# Patient Record
Sex: Female | Born: 1940 | Race: White | Hispanic: No | Smoking: Never smoker
Health system: Southern US, Community
[De-identification: ages and names within clinical notes are randomized; demographics above are authoritative.]

## PROBLEM LIST (undated history)

## (undated) DIAGNOSIS — K219 Gastro-esophageal reflux disease without esophagitis: Secondary | ICD-10-CM

## (undated) DIAGNOSIS — M1990 Unspecified osteoarthritis, unspecified site: Secondary | ICD-10-CM

## (undated) DIAGNOSIS — R569 Unspecified convulsions: Secondary | ICD-10-CM

## (undated) DIAGNOSIS — I10 Essential (primary) hypertension: Secondary | ICD-10-CM

## (undated) DIAGNOSIS — I2510 Atherosclerotic heart disease of native coronary artery without angina pectoris: Secondary | ICD-10-CM

## (undated) DIAGNOSIS — L4050 Arthropathic psoriasis, unspecified: Secondary | ICD-10-CM

## (undated) DIAGNOSIS — I208 Other forms of angina pectoris: Secondary | ICD-10-CM

## (undated) DIAGNOSIS — R0600 Dyspnea, unspecified: Secondary | ICD-10-CM

## (undated) DIAGNOSIS — I219 Acute myocardial infarction, unspecified: Secondary | ICD-10-CM

## (undated) DIAGNOSIS — E119 Type 2 diabetes mellitus without complications: Secondary | ICD-10-CM

## (undated) DIAGNOSIS — I509 Heart failure, unspecified: Secondary | ICD-10-CM

## (undated) DIAGNOSIS — G459 Transient cerebral ischemic attack, unspecified: Secondary | ICD-10-CM

## (undated) DIAGNOSIS — E7800 Pure hypercholesterolemia, unspecified: Secondary | ICD-10-CM

## (undated) DIAGNOSIS — I214 Non-ST elevation (NSTEMI) myocardial infarction: Secondary | ICD-10-CM

## (undated) HISTORY — PX: CORONARY ANGIOPLASTY: SHX604

## (undated) HISTORY — PX: CAROTID STENT INSERTION: SHX5766

## (undated) HISTORY — PX: CHOLECYSTECTOMY OPEN: SUR202

## (undated) HISTORY — PX: TONSILLECTOMY: SUR1361

## (undated) HISTORY — PX: DILATION AND CURETTAGE OF UTERUS: SHX78

## (undated) HISTORY — PX: CATARACT EXTRACTION W/ INTRAOCULAR LENS  IMPLANT, BILATERAL: SHX1307

## (undated) HISTORY — PX: CARDIAC CATHETERIZATION: SHX172

## (undated) HISTORY — PX: APPENDECTOMY: SHX54

## (undated) HISTORY — PX: THROMBECTOMY FEMORAL ARTERY: SHX6406

---

## 1971-08-25 HISTORY — PX: ABDOMINAL HYSTERECTOMY: SHX81

## 1995-08-25 HISTORY — PX: CORONARY ARTERY BYPASS GRAFT: SHX141

## 2004-12-01 ENCOUNTER — Ambulatory Visit: Payer: Self-pay | Admitting: Internal Medicine

## 2005-03-31 ENCOUNTER — Ambulatory Visit: Payer: Self-pay

## 2006-03-02 ENCOUNTER — Ambulatory Visit: Payer: Self-pay

## 2006-03-05 ENCOUNTER — Ambulatory Visit: Payer: Self-pay

## 2006-04-05 ENCOUNTER — Ambulatory Visit: Payer: Self-pay | Admitting: Internal Medicine

## 2006-04-14 ENCOUNTER — Ambulatory Visit: Payer: Self-pay | Admitting: Internal Medicine

## 2006-09-15 ENCOUNTER — Inpatient Hospital Stay: Payer: Self-pay | Admitting: Vascular Surgery

## 2007-04-07 ENCOUNTER — Ambulatory Visit: Payer: Self-pay | Admitting: Internal Medicine

## 2007-05-10 ENCOUNTER — Ambulatory Visit: Payer: Self-pay | Admitting: Internal Medicine

## 2007-08-29 ENCOUNTER — Ambulatory Visit: Payer: Self-pay | Admitting: Internal Medicine

## 2007-09-07 ENCOUNTER — Ambulatory Visit: Payer: Self-pay | Admitting: Internal Medicine

## 2007-12-13 ENCOUNTER — Other Ambulatory Visit: Payer: Self-pay

## 2007-12-13 ENCOUNTER — Ambulatory Visit: Payer: Self-pay | Admitting: Ophthalmology

## 2007-12-27 ENCOUNTER — Ambulatory Visit: Payer: Self-pay | Admitting: Ophthalmology

## 2008-04-09 ENCOUNTER — Ambulatory Visit: Payer: Self-pay | Admitting: Internal Medicine

## 2008-06-03 ENCOUNTER — Other Ambulatory Visit: Payer: Self-pay

## 2008-06-03 ENCOUNTER — Emergency Department: Payer: Self-pay | Admitting: Unknown Physician Specialty

## 2008-08-29 ENCOUNTER — Ambulatory Visit: Payer: Self-pay | Admitting: Internal Medicine

## 2009-02-07 ENCOUNTER — Ambulatory Visit: Payer: Self-pay | Admitting: Internal Medicine

## 2009-04-17 ENCOUNTER — Ambulatory Visit: Payer: Self-pay | Admitting: Internal Medicine

## 2009-05-01 ENCOUNTER — Ambulatory Visit: Payer: Self-pay | Admitting: Ophthalmology

## 2009-05-01 ENCOUNTER — Ambulatory Visit: Payer: Self-pay | Admitting: Internal Medicine

## 2009-05-14 ENCOUNTER — Ambulatory Visit: Payer: Self-pay | Admitting: Ophthalmology

## 2009-07-17 ENCOUNTER — Ambulatory Visit: Payer: Self-pay | Admitting: Unknown Physician Specialty

## 2009-07-30 ENCOUNTER — Ambulatory Visit: Payer: Self-pay | Admitting: Unknown Physician Specialty

## 2009-09-11 ENCOUNTER — Ambulatory Visit: Payer: Self-pay | Admitting: Internal Medicine

## 2009-09-24 ENCOUNTER — Ambulatory Visit: Payer: Self-pay | Admitting: Vascular Surgery

## 2009-10-02 ENCOUNTER — Inpatient Hospital Stay: Payer: Self-pay | Admitting: Vascular Surgery

## 2010-02-06 ENCOUNTER — Ambulatory Visit: Payer: Self-pay

## 2010-02-14 ENCOUNTER — Emergency Department: Payer: Self-pay | Admitting: Emergency Medicine

## 2010-04-22 ENCOUNTER — Ambulatory Visit: Payer: Self-pay | Admitting: Internal Medicine

## 2010-06-14 ENCOUNTER — Emergency Department: Payer: Self-pay | Admitting: Emergency Medicine

## 2010-08-09 ENCOUNTER — Inpatient Hospital Stay: Payer: Self-pay | Admitting: Vascular Surgery

## 2010-08-15 LAB — PATHOLOGY REPORT

## 2011-02-06 ENCOUNTER — Ambulatory Visit: Payer: Self-pay | Admitting: Neurology

## 2011-04-24 ENCOUNTER — Ambulatory Visit: Payer: Self-pay | Admitting: Internal Medicine

## 2011-09-16 ENCOUNTER — Emergency Department: Payer: Self-pay | Admitting: Emergency Medicine

## 2011-09-16 ENCOUNTER — Ambulatory Visit: Payer: Self-pay

## 2011-09-16 LAB — COMPREHENSIVE METABOLIC PANEL
Albumin: 4.2 g/dL (ref 3.4–5.0)
Alkaline Phosphatase: 44 U/L — ABNORMAL LOW (ref 50–136)
Anion Gap: 12 (ref 7–16)
Bilirubin,Total: 0.3 mg/dL (ref 0.2–1.0)
Chloride: 102 mmol/L (ref 98–107)
Co2: 26 mmol/L (ref 21–32)
Creatinine: 0.72 mg/dL (ref 0.60–1.30)
EGFR (African American): >60
EGFR (Non-African Amer.): >60
Glucose: 200 mg/dL — ABNORMAL HIGH (ref 65–99)
Osmolality: 286 (ref 275–301)
Potassium: 4.3 mmol/L (ref 3.5–5.1)
SGPT (ALT): 24 U/L
Sodium: 140 mmol/L (ref 136–145)
Total Protein: 7.8 g/dL (ref 6.4–8.2)

## 2011-09-16 LAB — APTT: Activated PTT: 30 secs (ref 23.6–35.9)

## 2011-09-16 LAB — CBC
HGB: 12.7 g/dL (ref 12.0–16.0)
MCH: 34.6 pg — ABNORMAL HIGH (ref 26.0–34.0)
Platelet: 156 10*3/uL (ref 150–440)
RBC: 3.67 10*6/uL — ABNORMAL LOW (ref 3.80–5.20)
WBC: 5.9 10*3/uL (ref 3.6–11.0)

## 2011-09-16 LAB — PROTIME-INR: INR: 0.9

## 2011-09-25 ENCOUNTER — Ambulatory Visit: Payer: Self-pay

## 2011-10-23 ENCOUNTER — Ambulatory Visit: Payer: Self-pay

## 2012-04-26 ENCOUNTER — Ambulatory Visit: Payer: Self-pay | Admitting: Internal Medicine

## 2013-02-26 LAB — TROPONIN I: Troponin-I: <0.02 ng/mL

## 2013-02-26 LAB — BASIC METABOLIC PANEL
Anion Gap: 9 (ref 7–16)
BUN: 15 mg/dL (ref 7–18)
Chloride: 104 mmol/L (ref 98–107)
Co2: 27 mmol/L (ref 21–32)
Creatinine: 0.91 mg/dL (ref 0.60–1.30)
EGFR (Non-African Amer.): >60
Glucose: 377 mg/dL — ABNORMAL HIGH (ref 65–99)
Osmolality: 296 (ref 275–301)

## 2013-02-26 LAB — CBC
HCT: 37.6 % (ref 35.0–47.0)
HGB: 12.9 g/dL (ref 12.0–16.0)
MCH: 34.8 pg — ABNORMAL HIGH (ref 26.0–34.0)
Platelet: 252 10*3/uL (ref 150–440)
RBC: 3.7 10*6/uL — ABNORMAL LOW (ref 3.80–5.20)
WBC: 5.6 10*3/uL (ref 3.6–11.0)

## 2013-02-26 LAB — CK TOTAL AND CKMB (NOT AT ARMC): CK-MB: 1.7 ng/mL (ref 0.5–3.6)

## 2013-02-27 ENCOUNTER — Inpatient Hospital Stay: Payer: Self-pay | Admitting: Internal Medicine

## 2013-02-27 LAB — CK TOTAL AND CKMB (NOT AT ARMC): CK, Total: 75 U/L (ref 21–215)

## 2013-02-27 LAB — TROPONIN I: Troponin-I: 0.35 ng/mL — ABNORMAL HIGH

## 2013-02-28 LAB — BASIC METABOLIC PANEL
Chloride: 107 mmol/L (ref 98–107)
Co2: 26 mmol/L (ref 21–32)
EGFR (African American): >60
EGFR (Non-African Amer.): >60
Glucose: 197 mg/dL — ABNORMAL HIGH (ref 65–99)
Osmolality: 282 (ref 275–301)

## 2013-02-28 LAB — CBC WITH DIFFERENTIAL/PLATELET
Eosinophil #: 0.2 10*3/uL (ref 0.0–0.7)
Eosinophil %: 4.7 %
Lymphocyte #: 1.7 10*3/uL (ref 1.0–3.6)
MCHC: 34.3 g/dL (ref 32.0–36.0)
MCV: 101 fL — ABNORMAL HIGH (ref 80–100)
Monocyte #: 0.5 x10 3/mm (ref 0.2–0.9)
Monocyte %: 11.8 %
Neutrophil %: 45.2 %
Platelet: 211 10*3/uL (ref 150–440)

## 2013-04-18 ENCOUNTER — Ambulatory Visit: Payer: Self-pay | Admitting: Vascular Surgery

## 2013-04-18 LAB — BASIC METABOLIC PANEL
Anion Gap: 7 (ref 7–16)
BUN: 14 mg/dL (ref 7–18)
Co2: 27 mmol/L (ref 21–32)
EGFR (African American): >60
EGFR (Non-African Amer.): >60
Glucose: 257 mg/dL — ABNORMAL HIGH (ref 65–99)
Potassium: 4.2 mmol/L (ref 3.5–5.1)

## 2013-04-27 ENCOUNTER — Ambulatory Visit: Payer: Self-pay | Admitting: Internal Medicine

## 2013-05-25 ENCOUNTER — Ambulatory Visit: Payer: Self-pay | Admitting: Internal Medicine

## 2014-05-24 ENCOUNTER — Ambulatory Visit: Payer: Self-pay | Admitting: Internal Medicine

## 2014-08-09 ENCOUNTER — Ambulatory Visit: Payer: Self-pay | Admitting: Cardiology

## 2014-12-13 ENCOUNTER — Observation Stay
Admit: 2014-12-13 | Disposition: A | Discharge status: Home / Self Care | Payer: Self-pay | Attending: Internal Medicine | Admitting: Internal Medicine

## 2014-12-13 LAB — COMPREHENSIVE METABOLIC PANEL
ALT: 18 U/L
AST: 25 U/L
Albumin: 4.4 g/dL
Alkaline Phosphatase: 51 U/L
Anion Gap: 8 (ref 7–16)
BUN: 16 mg/dL
Bilirubin,Total: 0.6 mg/dL
CALCIUM: 9.3 mg/dL
Chloride: 102 mmol/L
Co2: 25 mmol/L
Creatinine: 0.8 mg/dL
EGFR (African American): >60
EGFR (Non-African Amer.): >60
GLUCOSE: 307 mg/dL — AB
Potassium: 4.7 mmol/L
SODIUM: 135 mmol/L
Total Protein: 7.9 g/dL

## 2014-12-13 LAB — CBC
HCT: 38.2 % (ref 35.0–47.0)
HGB: 12.7 g/dL (ref 12.0–16.0)
MCH: 31.2 pg (ref 26.0–34.0)
MCHC: 33.3 g/dL (ref 32.0–36.0)
MCV: 94 fL (ref 80–100)
PLATELETS: 186 10*3/uL (ref 150–440)
RBC: 4.09 10*6/uL (ref 3.80–5.20)
RDW: 14.6 % — AB (ref 11.5–14.5)
WBC: 5.8 10*3/uL (ref 3.6–11.0)

## 2014-12-13 LAB — CK TOTAL AND CKMB (NOT AT ARMC)
CK, Total: 119 U/L
CK, Total: 103 U/L
CK-MB: 4 ng/mL
CK-MB: 4.3 ng/mL

## 2014-12-13 LAB — TROPONIN I: Troponin-I: 0.09 ng/mL — ABNORMAL HIGH

## 2014-12-14 LAB — COMPREHENSIVE METABOLIC PANEL
ANION GAP: 5 — AB (ref 7–16)
Albumin: 3.9 g/dL
Alkaline Phosphatase: 47 U/L
BILIRUBIN TOTAL: 0.6 mg/dL
BUN: 16 mg/dL
CHLORIDE: 106 mmol/L
CREATININE: 0.69 mg/dL
Calcium, Total: 8.9 mg/dL
Co2: 25 mmol/L
EGFR (Non-African Amer.): >60
Glucose: 245 mg/dL — ABNORMAL HIGH
Potassium: 4 mmol/L
SGOT(AST): 21 U/L
SGPT (ALT): 15 U/L
Sodium: 136 mmol/L
TOTAL PROTEIN: 6.9 g/dL

## 2014-12-14 LAB — CBC WITH DIFFERENTIAL/PLATELET
Basophil #: 0 10*3/uL (ref 0.0–0.1)
Basophil %: 0.8 %
EOS PCT: 2.6 %
Eosinophil #: 0.1 10*3/uL (ref 0.0–0.7)
HCT: 35.4 % (ref 35.0–47.0)
HGB: 11.7 g/dL — ABNORMAL LOW (ref 12.0–16.0)
LYMPHS PCT: 41.3 %
Lymphocyte #: 2 10*3/uL (ref 1.0–3.6)
MCH: 30.4 pg (ref 26.0–34.0)
MCHC: 32.9 g/dL (ref 32.0–36.0)
MCV: 92 fL (ref 80–100)
MONO ABS: 0.7 x10 3/mm (ref 0.2–0.9)
Monocyte %: 14.9 %
NEUTROS PCT: 40.4 %
Neutrophil #: 2 10*3/uL (ref 1.4–6.5)
Platelet: 163 10*3/uL (ref 150–440)
RBC: 3.83 10*6/uL (ref 3.80–5.20)
RDW: 14.3 % (ref 11.5–14.5)
WBC: 4.9 10*3/uL (ref 3.6–11.0)

## 2014-12-14 LAB — CK TOTAL AND CKMB (NOT AT ARMC)
CK, Total: 91 U/L
CK-MB: 4.6 ng/mL

## 2014-12-14 LAB — TROPONIN I: Troponin-I: 0.18 ng/mL — ABNORMAL HIGH

## 2014-12-14 NOTE — H&P (Signed)
PATIENT NAME:  Leah Small, RAZ MR#:  [id number] DATE OF BIRTH:  [birth year]  DATE OF ADMISSION:  02/27/2013  PRIMARY CARE PHYSICIAN: Dr. Aram Beecham.   CARDIOLOGIST: Dr. Gwen Pounds.   REFERRING PHYSICIAN: Brandt Loosen.   CHIEF COMPLAINT: Chest pain, hyperglycemia, uncontrolled hypertension and overdose with insulin.   HISTORY OF PRESENT ILLNESS: Leah Small is a 74 year old Caucasian female with a history of coronary artery disease, systemic hypertension, diabetes mellitus type 2, chronic stable angina, usually exertional chest pain. Last time was 2 weeks ago; however yesterday around noon time, she had chest pain with right arm pain. The location of the chest pain was at the mid chest area. This was brief. Subsided, then she went to sleep to wake up after while around the evening when the chest pain recurred and it reached 10 on a scale of 10 in severity. Gradually, it went down to 8 on a scale of 10. She noted her blood pressure is elevated, reaching 164/78, and her blood sugar was high, reaching 454. At that point, she called EMS and before EMS arrived, the patient took 50 units of mixed Humalog 75/25. Usually, she is supposed to take 12 units, but because the blood sugar was elevated, she over did it. The patient was transferred to the Emergency Department for further evaluation. Her EKG is unremarkable. Her first troponin was normal; however, the second one was high, reaching 0.22. The patient is now in the process to be admitted to the hospital for further evaluation and management. Her admission blood sugar is 377, 298, then 241. Right now, she is chest pain-free. There were no other associated symptoms like sweating or shortness of breath or vomiting or syncope.   REVIEW OF SYSTEMS:   CONSTITUTIONAL: Denies having any fever. No chills. No fatigue.  EYES: No blurring of vision. No double vision.  ENT: No hearing impairment. No sore throat. No dysphagia.  CARDIOVASCULAR: Reports chest pain as  above. Mild shortness of breath, if any. No peripheral edema. No syncope.  RESPIRATORY: No cough. No hemoptysis. Chest pain as reported above.  GASTROINTESTINAL: No abdominal pain, no vomiting, no diarrhea.  GENITOURINARY: No dysuria. No frequency of urination.  MUSCULOSKELETAL: No joint pain or swelling. No muscular pain or swelling other than her right arm pain in association with the chest pain.  INTEGUMENTARY: No skin rash. No ulcers.  NEUROLOGY: No focal weakness. No seizure activity. No headache.  PSYCHIATRY: No anxiety or depression.  ENDOCRINE: No polyuria or polydipsia. No heat or cold intolerance.   PAST MEDICAL HISTORY: Coronary artery disease. She had a nuclear stress test in January of this year reported to be negative. History of transient ischemic attack, systemic hypertension, peripheral vascular disease status post carotid endarterectomy, diabetes mellitus type 2 uncontrolled, gastroesophageal reflux disease, psoriasis. History of mild dementia, but the patient denies. She was on Aricept but she stopped it.   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   SOCIAL HABITS: Nonsmoker. No history of alcohol or drug abuse.   SOCIAL HISTORY: She is widowed. Lives at home with her son and daughter-in-law.   FAMILY HISTORY: Her father died at the age of 19 after having a heart attack. Her mother died at the age of 3 after complications of diabetes resulting in blindness and stroke.   ADMISSION MEDICATIONS: Metformin 1000 mg twice a day. She used to be on glyburide, but she states this was stopped  and changed to another medication that she does not recall the name of. She is on Plavix 75 mg a day, Ranexa 500 mg 2 tablets at night, metoprolol 1/2 tablet of 25, that is 12.5 mg at night. Methotrexate 2.5 mg tablet taking 8 tablets once a  week. This medication is on hold for the time being for 6 weeks. She takes that 6 weeks on and 6 weeks off . Vitamin B12 500 mcg once a day, vitamin E 400 units and iron 65 mg.   ALLERGIES: THESE INCLUDE TALWIN AND VICODIN CAUSING SKIN RASH. CODEINE, LIPITOR, VALIUM, IMDUR AND ZOCOR CAUSING GI DISTURBANCES BUT NOT TRUE ALLERGY.   PHYSICAL EXAMINATION:  VITAL SIGNS: Blood pressure 119/59, respiratory rate 20, pulse 72 and temperature 97.9. Subsequent blood pressure was higher, reached 180 systolic. Her O2 saturation 96%.  GENERAL APPEARANCE: Elderly female sitting on the stretcher in no acute distress.  HEAD AND NECK: No pallor. No icterus. No cyanosis. Ear examination revealed normal hearing, no discharge, no lesions. Examination of the nose showed no discharge, no bleeding, no ulcers. Examination of the mouth showed normal lips and tongue, no oral thrush, no ulcers. Examination of the eyes revealed normal eyelids and conjunctivae. Pupils about 4 to 5 mm, round, equal and reactive to light. Neck is supple. Trachea at midline. No thyromegaly. No cervical lymphadenopathy. No masses.  HEART: Normal S1, S2. No S3, S4. No murmur. No gallop. No carotid bruits.  RESPIRATORY: Normal breathing pattern without use of accessory muscles. No rales. No wheezing.  ABDOMEN: Soft without tenderness. No hepatosplenomegaly. No masses. No hernias.  SKIN: No nodules. No ulcerations.  MUSCULOSKELETAL: No joint swelling. No clubbing.  NEUROLOGIC: Cranial nerves II through XII were intact. No focal motor deficit.  PSYCHIATRIC: The patient is alert and oriented x3. Mood and affect were normal.   LABORATORY FINDINGS: Her EKG showed normal sinus rhythm at rate of 77 per minute. Nonspecific ST abnormalities in the lateral leads. Her serum glucose 377. Followup was 298, then 241. BUN 15, creatinine 0.9, sodium 140, potassium 4.3. First troponin less than 0.02. The second troponin 0.22. CBC showed white count of 5000, hemoglobin  12.9, hematocrit 37, platelet count 252.   ASSESSMENT:  1. Non-ST-elevation myocardial infarction.  2. Uncontrolled diabetes mellitus.  3. Uncontrolled hypertension.  4. Overdose with insulin.  5. Peripheral arterial disease.  6. Coronary artery disease, status post coronary artery bypass graft.  7. Psoriasis.  8. History of transient ischemic attack and mild dementia.   PLAN: Will admit the patient to telemetry monitoring. Continue monitoring the troponin. Will place her on Lovenox 70 mg twice a day. Nitroglycerin patch. Continue beta blocker. Continue Plavix. Cardiology consultation with Dr. Gwen PoundsKowalski, her cardiologist. I will hold metformin in case there are plans for cardiac cath. Monitor blood sugar and watch for hypoglycemia. She appears certain that she took 50 units of the mixed insulin. For deep vein thrombosis prophylaxis, she is already on high dose of Lovenox 1 mg/kg for anticoagulation given her non-STEMI. The patient indicates that she has a Living Will. Her code status is FULL CODE unless she is in a vegetative state, then she does not want to be prolonged on life support.   Time spent in evaluating this patient and reviewing medical records took more than 1 hour.    ____________________________ Carney CornersAmir M. Rudene Rearwish, MD amd:gb D: 02/27/2013 03:06:33 ET T: 02/27/2013 03:58:15 ET JOB#: [id number]  cc: Carney CornersAmir M. Rudene Rearwish, MD, <Dictator> Karolee OhsAMIR Dala DockM Philamena Kramar MD ELECTRONICALLY SIGNED 02/27/2013 6:11

## 2014-12-14 NOTE — Op Note (Signed)
PATIENT NAME:  Leah Small, Leah Small MR#:  [id number] DATE OF BIRTH:  [birth year]  DATE OF PROCEDURE:  04/18/2013  PREOPERATIVE DIAGNOSIS: Atherosclerotic occlusive disease bilateral lower extremities with rest pain bilateral lower extremities.   POSTOPERATIVE DIAGNOSIS:  Atherosclerotic occlusive disease bilateral lower extremities with rest pain bilateral lower extremities.   PROCEDURES PERFORMED 1.  Abdominal aortogram.  2.  Right lower extremity distal runoff, third order catheter placement.  3.  Percutaneous transluminal angioplasty of the right superficial femoral artery to 5 mm  diameter.   SURGEON: Levora DredgeGregory Nyiesha Beever, M.D.   SEDATION: Versed 4 mg plus fentanyl 150 mcg administered IV. Continuous ECG, pulse oximetry and cardiopulmonary monitoring is performed throughout the entire procedure by the interventional radiology nurse. Total sedation time is 45 minutes.   ACCESS: A 6-French sheath, left common femoral artery.   FLUOROSCOPY TIME: 4.1 minutes.   CONTRAST USED: Isovue 64 mL.   INDICATIONS: Leah Small is a 74 year old woman who presented to the office with increasing pain bilateral lower extremities. Physical examination as well as noninvasive studies demonstrated significant deterioration of her arterial status, right leg worse than left, with noninvasive studies that were now consistent with rest pain. Therefore, discussion regarding limb salvage with angio and intervention was undertaken. The patient has agreed to proceed. Risks and benefits were reviewed. Alternative therapies were also discussed.   DESCRIPTION OF PROCEDURE: The patient is taken to special procedures and placed in the supine position. After adequate sedation is achieved, both groins are prepped and draped in sterile fashion. Ultrasound is placed in a sterile sleeve. Left common femoral vein is imaged.  It is echolucent and pulsatile, indicating patency. Image is recorded. Femoral bifurcation is identified and the  artery is scanned more proximally and a site selected for access. Lidocaine 1%  is infiltrated under direct visualization and a micropuncture needle is used to puncture the artery under real-time ultrasound visualization. Microwire followed by microsheath, J-wire followed by a 5-French sheath and 5-French pigtail catheter. The pigtail catheter is positioned at the level of T12 and AP projection of the aorta is obtained.   Pigtail catheter is repositioned to above the bifurcation and an LAO projection of the pelvis is obtained. After review of the images, an advantage wire is used to cross the bifurcation and negotiated down into the common femoral. Pigtail catheter is then advanced to the distal external iliac and an RAO projection of the groin is obtained. After review of the images, the wire is reintroduced and the pigtail catheter is negotiated into the SFA where distal runoff is performed. Occlusion of the SFA at Hunter's canal is noted. Heparin 5000 units is given and the advantage wire is reintroduced. A 6-French Ansell sheath is advanced up and over the bifurcation, positioned with its tip in the mid common femoral. Using a straight catheter over the advantage wire, insertion with the advantage wire, the lesion is crossed. Hand injection of contrast through the straight catheter is used to verify intraluminal placement distally and to perform distal runoff. Two-vessel runoff is noted.   The advantage wire is reintroduced and first a 4 x 4 mm balloon and subsequently a 5 x 4 mm balloon is used to angioplasty the occlusion. Followup angiography after the 5 mm insufflation demonstrates an excellent result with less than 5% residual stenosis and preservation of the distal runoff. It should be noted that the distal runoff is very, very sluggish, consistent with a high resistance pattern and moderate to severe distal small vessel disease intrinsic  to the foot.   The sheath is then pulled into the left iliac  system, oblique view obtained and a StarClose device deployed without difficulty. There are no immediate complications.   INTERPRETATION: The abdominal aortogram shows diffuse disease throughout the aortoiliac system but there are no hemodynamically significant stenoses noted within the aorta, renal arteries or bilateral iliac artery systems.   The left common femoral and profunda femoris is widely patent although there is diffuse disease. No hemodynamically significant lesions are identified. The superficial femoral artery occludes at Hunter's canal and is reconstituted above the knee. Distal popliteal is patent and there is 2-vessel runoff via the peroneal and posterior tibial to the foot.   As described above, following angioplasty to 5 mm, there is now recanalization with minimal residual stenosis. Successful angioplasty noted.   SUMMARY: Successful recanalization of the right lower extremity as described above.   ____________________________ Renford Dills, MD ggs:cs D: 04/18/2013 18:09:43 ET T: 04/18/2013 19:05:43 ET JOB#: [id number]  cc: Renford Dills, MD, <Dictator> Duane Lope. Judithann Sheen, MD Renford Dills MD ELECTRONICALLY SIGNED 05/05/2013 9:11

## 2014-12-14 NOTE — Consult Note (Signed)
PATIENT NAME:  Leah Small, Leah Small MR#:  [id number] DATE OF BIRTH:  [birth year]  DATE OF CONSULTATION:  02/27/2013  REFERRING PHYSICIAN:  Marlaine Hind, MD CONSULTING PHYSICIAN:  Marcina Millard, MD  REASON FOR CONSULTATION:  Elevated troponin, chest pain.  HISTORY OF PRESENT ILLNESS: The patient is a 74 year old female with known history of coronary artery disease status post bypass graft surgery, hypertension and diabetes. The patient reports a history of chronic stable angina, followed by Dr. Gwen Pounds. She apparently underwent a stress test approximately a month ago. Cardiac catheterization was deferred. On 02/26/2013, the patient noted that her blood sugar was elevated and took extra insulin. At the same time, her blood pressure was elevated to 164/78. The patient called EMS and was brought to [facility] where EKG was unremarkable. Initial troponin was negative. Other admission labs were notable for a serum glucose of 377.  The patient is chest pain free today. Follow-up troponin was 0.44.   PAST MEDICAL HISTORY: 1.  Known coronary artery disease status post bypass graft surgery in 1997. Cardiac catheterization in 2008 revealed patent LIMA to LAD and occluded saphenous vein bypass grafts.  2.  Chronic stable angina.  3.  Hypertension.  4.  Hyperlipidemia.  5.  Diabetes.  6.  Bilateral carotid endarterectomies. 7.  History of TIA.   MEDICATIONS ON ADMISSION: 1.  Aspirin 81 mg daily.  2.  Clopidogrel 75 mg daily.  3.  Ranexa 500 mg 2 tablets at bedtime. 4.  Metoprolol tartrate 12.5 mg at bedtime.  5.  Nitroglycerin p.r.n.  6.  Methotrexate 2.5 mg tablets weekly.  7.  Vitamin B12 500 mcg daily.  8.  Iron tablet 325 mg daily.  9.  Vitamin E 400 units daily.  10.  Humulin 70/30 16 units q. a.m. and 12 units q. p.m. 11.  Metformin 1000 mg b.i.d.  12.  Glipizide 10 mg in the morning.   SOCIAL HISTORY: The patient is married. She is retired. She denies tobacco abuse.   FAMILY  HISTORY: Father died at age 38 with history of multiple MIs. Brother at age 25 status post multiple MIs and coronary artery bypass graft surgery.   REVIEW OF SYSTEMS: CONSTITUTIONAL: No fever or chills.  EYES: No blurry vision.  EARS: No hearing loss.  RESPIRATORY: No shortness of breath.  CARDIOVASCULAR: Chest pain, described above.  GASTROINTESTINAL: No nausea, vomiting, diarrhea or constipation.  GENITOURINARY: No dysuria or hematuria.  ENDOCRINE: No polyuria or polydipsia.  MUSCULOSKELETAL: No arthralgias or myalgias.  NEUROLOGICAL: No focal muscle weakness or numbness.  PSYCHOLOGICAL: No depression or anxiety.   PHYSICAL EXAMINATION: VITAL SIGNS: Blood pressure 118/71, pulse 76, temperature 97.7, respirations 18, pulse ox 95%.  HEENT: Pupils equal and reactive to light and accommodation.  NECK: Supple without thyromegaly.  LUNGS: Clear.  HEART: Normal JVP. Normal PMI. Regular rate and rhythm. Normal S1, S2. No appreciable gallop, murmur or rub.  ABDOMEN: Soft and nontender. Pulses were intact bilaterally.  MUSCULOSKELETAL: Normal muscle tone.  NEUROLOGIC: The patient is alert and oriented x 3. Motor and sensory are both grossly intact.   IMPRESSION: A 74 year old female with known coronary artery disease, triple vessel disease status post CABG with known occluded bypass grafts with patent LIMA to LAD who presents with chief complaint of hyperglycemia and hypertension with associated chest pain with borderline elevated troponin. Chest pain now resolved. There are no new EKG changes. During last cardiac catheterization,  the patient was felt not to be a candidate for further redo  surgery or intervention.   RECOMMENDATIONS: 1.  Agree with overall current therapy.  2.  Continue Lovenox 24 to 48 hours.  3.  If the patient remains chest pain free, would defer further cardiac catheterization at this time in light of the patient's known coronary anatomy. Borderline elevated troponin likely  demand/supply ischemia and not due to acute coronary syndrome.   ____________________________ Marcina MillardAlexander Ashleah Valtierra, MD ap:sb D: 02/27/2013 09:02:24 ET T: 02/27/2013 09:17:58 ET JOB#: [id number]  cc: Marcina MillardAlexander Bellah Alia, MD, <Dictator> Marcina MillardALEXANDER Cecilia Vancleve MD ELECTRONICALLY SIGNED 03/14/2013 12:29

## 2014-12-15 LAB — BASIC METABOLIC PANEL
Anion Gap: 8 (ref 7–16)
BUN: 21 mg/dL — ABNORMAL HIGH
CALCIUM: 8.6 mg/dL — AB
CO2: 24 mmol/L
Chloride: 101 mmol/L
Creatinine: 0.71 mg/dL
Glucose: 143 mg/dL — ABNORMAL HIGH
Potassium: 4.2 mmol/L
SODIUM: 133 mmol/L — AB

## 2014-12-15 LAB — CBC WITH DIFFERENTIAL/PLATELET
Basophil #: 0 10*3/uL (ref 0.0–0.1)
Basophil %: 0.7 %
EOS ABS: 0.2 10*3/uL (ref 0.0–0.7)
Eosinophil %: 3.3 %
HCT: 33.7 % — AB (ref 35.0–47.0)
HGB: 11 g/dL — ABNORMAL LOW (ref 12.0–16.0)
Lymphocyte #: 1.9 10*3/uL (ref 1.0–3.6)
Lymphocyte %: 36.5 %
MCH: 30.5 pg (ref 26.0–34.0)
MCHC: 32.8 g/dL (ref 32.0–36.0)
MCV: 93 fL (ref 80–100)
MONO ABS: 0.6 x10 3/mm (ref 0.2–0.9)
Monocyte %: 12.5 %
NEUTROS ABS: 2.4 10*3/uL (ref 1.4–6.5)
Neutrophil %: 47 %
Platelet: 156 10*3/uL (ref 150–440)
RBC: 3.62 10*6/uL — ABNORMAL LOW (ref 3.80–5.20)
RDW: 14.2 % (ref 11.5–14.5)
WBC: 5.1 10*3/uL (ref 3.6–11.0)

## 2014-12-15 LAB — TROPONIN I: Troponin-I: 0.07 ng/mL — ABNORMAL HIGH

## 2014-12-23 NOTE — Consult Note (Signed)
Brief Consult Note: Diagnosis: pt with severe diffuse coronary artery disease wiht recent cardiac cath reveling perisistnat diffuse cad felt to be [facility]. Now admitted with increasing chest pain. Has ruled out for mi.   Patient was seen by consultant.   Recommend further assessment or treatment.   Discussed with Attending MD.   Comments: Pt wiht diffuse cad. Will treat medically. Agreee with increasing nitrates and continueing ranexa. Not candidate for cath. Will ambulae in am and consider discharge if improved and stable. Full note to follow.  Electronic Signatures: Dalia HeadingFath, Commodore Bellew A (MD)  (Signed 22-Apr-16 22:45)  Authored: Brief Consult Note   Last Updated: 22-Apr-16 22:45 by Dalia HeadingFath, Karys Meckley A (MD)

## 2014-12-23 NOTE — Discharge Summary (Addendum)
PATIENT NAME:  Leah FishmanWRIGHT, Harsimran R MR#:  [id number] DATE OF BIRTH:  1940-10-24  DATE OF ADMISSION:  12/13/2014 DATE OF DISCHARGE:  12/16/2014  DISCHARGE DIAGNOSES:  1. Angina.  2. Coronary artery disease.  3. Hypertension.  4. Diabetes.  5. Hyperlipidemia.   CONSULTING PHYSICIAN:  Dr. Lady GaryFath in cardiology.   HISTORY OF PRESENT ILLNESS:  Please see initial history and physical for details.  This is a patient with known medically managed, coronary artery disease as well as diabetes, hyperlipidemia, and hypertension. She was admitted with progressive chest pain.    HOSPITAL COURSE:  She has ruled out for cardiac event and was seen by cardiology.  She recently had a catheterization with diffuse non-amenable disease.  Her anti-anginals were managed by increasing her isosorbide from 30 mg once a day to 60 twice a day.  She was able to ambulate without difficulty.  She was also able to tolerate the increased dose of the isosorbide.  She will follow up with cardiology as well as with Dr. Judithann SheenSparks within 1-2 weeks of discharge.     DISCHARGE MEDICATIONS:  [facility]  Briefly, reinitiated all of her outpatient medications except her Imdur 30 mg once a day was discontinued and replaced with isosorbide 60 mg twice a day.   DISCHARGE CODE STATUS:  Full code.   DISCHARGE ACTIVITY:  As tolerated.   DISCHARGE DIET:  ADA carbohydrate-controlled diet.   FOLLOWUP:  Follow up with Dr. Judithann SheenSparks and cardiology within 1-2 weeks.   TIME SPENT:  This discharge took 35 minutes.    ____________________________ Stann Mainlandavid P. Sampson GoonFitzgerald, MD dpf:kc D: 12/16/2014 12:42:01 ET T: 12/16/2014 15:06:50 ET JOB#: [id number]  cc: Stann Mainlandavid P. Sampson GoonFitzgerald, MD, <Dictator> Khyre Germond Sampson GoonFITZGERALD MD ELECTRONICALLY SIGNED 12/25/2014 10:21

## 2014-12-23 NOTE — H&P (Addendum)
PATIENT NAME:  Leah Small, Leah Small MR#:  [id number] DATE OF BIRTH:  23-Sep-1940  DATE OF ADMISSION:  12/13/2014  PRIMARY CARE PHYSICIAN:  Duane Lope. Judithann Sheen, MD  HISTORY OF PRESENT ILLNESS: The patient is a 74 year old Caucasian female with past medical history significant for a history of severe coronary artery disease.  She came to the hospital with complaints of severe chest pain being described as midsternal chest pain radiating to her back, jaw as well as left arm, 10/10 in intensity and lasting for 4 hours today.  She has not had such severe pain for a long period of time.  She was also somewhat short of breath.  She took 3 nitroglycerin with no significant relief of the pain and EMS was called.  EMS brought the patient to the Emergency Room.  Her blood pressure was marked elevated at 170 systolic.  Here in the hospital, she was started on nitroglycerin IV with which the pain somewhat subsided.  Now when nitroglycerin IV is discontinued, her blood pressure is up to close to 200 systolic.   PAST MEDICAL HISTORY:  Significant for history of coronary artery disease status post cardiac catheterization in December 2015 revealing severe 3-vessel coronary artery disease, patent LIMA to LAD, 50% stenosis of the distal native vessel, occluded SVG to PDA, OM1 as well as D1 near 75% stenosis/thrombus, appears unchanged according to the cardiologist, Dr. Darrold Junker, 90% proximal left circumflex obstruction which provides very little territory with occluded OM1 and OM2, ejection fraction of 50%, history of hypertension, as well as hyperlipidemia, history of TIAs, history of peripheral vascular disease, status post carotid endarterectomy, also PTA in right saphenous, gastroesophageal reflux disease, diabetes mellitus type 2 uncontrolled, history of mild dementia and also glaucoma.   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    SOCIAL HABITS:  No smoking, alcohol or drug abuse.  Lives with her son as well as daughter-in-law.   SOCIAL HISTORY:  The patient is widowed.   FAMILY HISTORY:  The patient's father died at age of 66 of a heart attack; mother at age of 46 with complications of diabetes mellitus, resulting blindness as well as stroke.  Her brother died in his 36s of coronary artery disease.  MEDICATIONS:  Per medical records, the patient is on aspirin 81 mg p.o. daily, atorvastatin 10 mg p.o. daily, brimonidine ophthalmic solution 0.2% 1 drop to each affected eye twice daily, Plavix 75 mg p.o. daily, glipizide extended release 10 mg daily, sliding scale insulin, isosorbide mononitrate 30 mg daily, insulin Lantus 24 units subcutaneously daily, metformin 1 gm twice daily, metoprolol 12.5 mg p.o. at bedtime, nitroglycerin 0.4 mg transdermal film every second day, Nitrostat 0.4 mg sublingually every 5 minutes as needed, Ranexa 5 mg once at bedtime,  timolol ophthalmic solution 0.5% to each affected eye twice daily, triamcinolone topical cream 0.1% 3 times daily as needed and vitamin B12 once daily.   ALLERGIES:  CODEINE, TALWIN, VICODIN, IMDUR, LIPITOR, VALIUM AS WELL AS ZOCOR, ALTHOUGH SHE IS ABLE TO TOLERATE LIPITOR WITH NO SIGNIFICANT PROBLEMS APPARENTLY.    REVIEW OF SYSTEMS:   CONSTITUTIONAL:  Denies fever, chills, fatigue or weakness. Admits to severe chest pain.  EYES:  She denies any vision problems except for the glaucoma.   ENT: Denies any tinnitus, allergies, epistaxis, sinus pain, dentures, difficulty swallowing.  RESPIRATORY: Denies any wheezes, asthma, COPD.  Admits to some shortness of breath.  CARDIOVASCULAR:  Admits to chest pains.  Denies palpitations or syncope. GASTROINTESTINAL:  Admits to bout of nausea.  Denies any vomiting, diarrhea or constipation.   GENITOURINARY:  Denies dysuria, hematuria, frequency or  incontinence. ENDOCRINOLOGY:  Denies any polydipsia, nocturia, thyroid problems, heat or cold intolerance or thirst.  HEMATOLOGIC:  Denies anemia, easy bruising or bleeding, swollen glands.  SKIN: Denies acne, rash, lesions or change in moles.  MUSCULOSKELETAL: Denies arthritis, cramps, swelling. NEUROLOGIC: Denies numbness, epilepsy or tremor.  PSYCHIATRIC: Denies anxiety, insomnia or depression.  PHYSICAL EXAMINATION: VITAL SIGNS: On arrival to the hospital: The patient's vital signs, temperature was 98.19F, pulse was 81, respiratory rate was 17, blood pressure 170/70, saturation was 97% on room air.  On my evaluation, her systolic blood pressure went up to 197 and she seemed to otherwise quite comfortable though.   GENERAL:  This is a well-developed, well-nourished, Caucasian female in no significant distress lying on the stretcher.  HEENT:  Her pupils are equal, reactive to light.  Extraocular muscles intact, no icterus or conjunctivitis.  Has normal hearing.  No pharyngeal erythema.  Mucosa is moist.  NECK:  No masses. Supple, nontender. Thyroid is not enlarged. No adenopathy. No JVD or carotid bruits bilaterally.  Full range of motion.  LUNGS:  Clear to auscultation in all fields.  No rales, rhonchi, diminished breath sounds or wheezing.  No labored inspiration, increased effort, dullness to percussion or overt respiratory distress.  CARDIOVASCULAR:  S1, S2 appreciated.  No murmurs, gallops or rubs.  PMI not lateralized. Chest is nontender to palpation.  Pedal pulses 1+.  No lower extremity edema, calf tenderness or cyanosis was noted.   ABDOMEN:  Soft, nontender.  Bowel sounds are present.  No hepatosplenomegaly or masses were noted.  RECTAL:  Deferred.  MUSCLE STRENGTH:  Able to move all extremities.  No cyanosis, degenerative joint disease or kyphosis.  Gait not tested.   SKIN:  Did not reveal any rashes, lesions, erythema, nodularity or induration.  It was warm and dry to  palpation. LYMPHATIC:  No adenopathy in the cervical region.  NEUROLOGIC:  Cranial nerves grossly intact.  Sensory is intact.  No dysarthria or aphasia.  The patient is alert, oriented to time, person and place, cooperative.  Memory is somewhat impaired.  PSYCHIATRIC: No significant confusion, agitation or depression noted.   LABORATORIES:  BMP within normal limits except a glucose level of 307.  Liver enzymes were normal.  The patient's cardiac enzymes first set, troponin was less than 0.03.  White blood cell count is 5.8, hemoglobin 12.7, platelet count 186,000.  EKG showed normal sinus rhythm at 79 beats per minute and normal axis ST-T changes in the lateral leads, but it is similar to prior EKG done in March 2016.    ASSESSMENT AND PLAN:   1.  Unstable angina.  Admit the patient to a medical floor, start on her on heparin subcutaneously. Check her cardiac enzymes x 3.  Continue her metoprolol, aspirin and Plavix.  Will  possibly discharge her home tomorrow.  2.  Hyperlipidemia.  Continue Lipitor.  3.  Diabetes mellitus.  Get hemoglobin A1c.  Will continue home medications as well as sliding scale insulin.  4.  Malignant hypertension.  We will continue nitroglycerin topically, questionable add lisinopril.  Will continue metoprolol.   TIME SPENT:  50 minutes on the patient.     ____________________________ Katharina Caper, MD rv:NT D: 12/13/2014 18:57:58 ET T: 12/13/2014 19:34:13 ET JOB#: [id number]  cc: Katharina Caper, MD, <Dictator>  Duane LopeJeffrey D. Judithann SheenSparks, MD  Katharina CaperIMA Izaah Westman MD ELECTRONICALLY SIGNED 12/21/2014 19:10

## 2015-04-25 ENCOUNTER — Other Ambulatory Visit: Payer: Self-pay | Admitting: Internal Medicine

## 2015-04-25 DIAGNOSIS — Z1231 Encounter for screening mammogram for malignant neoplasm of breast: Secondary | ICD-10-CM

## 2015-05-27 ENCOUNTER — Other Ambulatory Visit: Payer: Self-pay | Admitting: Internal Medicine

## 2015-05-27 ENCOUNTER — Ambulatory Visit
Admission: RE | Admit: 2015-05-27 | Discharge: 2015-05-27 | Disposition: A | Discharge status: Home / Self Care | Payer: Medicare Other | Source: Ambulatory Visit | Attending: Internal Medicine | Admitting: Internal Medicine

## 2015-05-27 DIAGNOSIS — Z1231 Encounter for screening mammogram for malignant neoplasm of breast: Secondary | ICD-10-CM | POA: Diagnosis not present

## 2015-07-06 ENCOUNTER — Inpatient Hospital Stay (HOSPITAL_COMMUNITY): Payer: Medicare Other

## 2015-07-06 ENCOUNTER — Encounter (HOSPITAL_COMMUNITY): Payer: Self-pay | Admitting: Emergency Medicine

## 2015-07-06 ENCOUNTER — Emergency Department (HOSPITAL_COMMUNITY): Payer: Medicare Other

## 2015-07-06 ENCOUNTER — Inpatient Hospital Stay (HOSPITAL_COMMUNITY)
Admission: EM | Admit: 2015-07-06 | Discharge: 2015-07-10 | DRG: 100 | Disposition: A | Discharge status: Skilled Nursing Facility | Payer: Medicare Other | Attending: Internal Medicine | Admitting: Internal Medicine

## 2015-07-06 DIAGNOSIS — I639 Cerebral infarction, unspecified: Secondary | ICD-10-CM

## 2015-07-06 DIAGNOSIS — A419 Sepsis, unspecified organism: Secondary | ICD-10-CM | POA: Insufficient documentation

## 2015-07-06 DIAGNOSIS — I10 Essential (primary) hypertension: Secondary | ICD-10-CM | POA: Diagnosis present

## 2015-07-06 DIAGNOSIS — F4489 Other dissociative and conversion disorders: Secondary | ICD-10-CM | POA: Diagnosis not present

## 2015-07-06 DIAGNOSIS — E118 Type 2 diabetes mellitus with unspecified complications: Secondary | ICD-10-CM | POA: Diagnosis not present

## 2015-07-06 DIAGNOSIS — E119 Type 2 diabetes mellitus without complications: Secondary | ICD-10-CM | POA: Diagnosis present

## 2015-07-06 DIAGNOSIS — E1151 Type 2 diabetes mellitus with diabetic peripheral angiopathy without gangrene: Secondary | ICD-10-CM | POA: Diagnosis present

## 2015-07-06 DIAGNOSIS — I209 Angina pectoris, unspecified: Secondary | ICD-10-CM | POA: Insufficient documentation

## 2015-07-06 DIAGNOSIS — Z7982 Long term (current) use of aspirin: Secondary | ICD-10-CM | POA: Diagnosis not present

## 2015-07-06 DIAGNOSIS — Z951 Presence of aortocoronary bypass graft: Secondary | ICD-10-CM

## 2015-07-06 DIAGNOSIS — I214 Non-ST elevation (NSTEMI) myocardial infarction: Secondary | ICD-10-CM | POA: Diagnosis present

## 2015-07-06 DIAGNOSIS — Z955 Presence of coronary angioplasty implant and graft: Secondary | ICD-10-CM | POA: Diagnosis not present

## 2015-07-06 DIAGNOSIS — I25119 Atherosclerotic heart disease of native coronary artery with unspecified angina pectoris: Secondary | ICD-10-CM | POA: Diagnosis not present

## 2015-07-06 DIAGNOSIS — I200 Unstable angina: Secondary | ICD-10-CM

## 2015-07-06 DIAGNOSIS — E785 Hyperlipidemia, unspecified: Secondary | ICD-10-CM | POA: Diagnosis not present

## 2015-07-06 DIAGNOSIS — R2990 Unspecified symptoms and signs involving the nervous system: Secondary | ICD-10-CM

## 2015-07-06 DIAGNOSIS — I6789 Other cerebrovascular disease: Secondary | ICD-10-CM | POA: Diagnosis not present

## 2015-07-06 DIAGNOSIS — R29708 NIHSS score 8: Secondary | ICD-10-CM | POA: Diagnosis present

## 2015-07-06 DIAGNOSIS — Z8249 Family history of ischemic heart disease and other diseases of the circulatory system: Secondary | ICD-10-CM | POA: Diagnosis not present

## 2015-07-06 DIAGNOSIS — R413 Other amnesia: Secondary | ICD-10-CM | POA: Insufficient documentation

## 2015-07-06 DIAGNOSIS — R569 Unspecified convulsions: Secondary | ICD-10-CM | POA: Diagnosis not present

## 2015-07-06 DIAGNOSIS — I69354 Hemiplegia and hemiparesis following cerebral infarction affecting left non-dominant side: Secondary | ICD-10-CM

## 2015-07-06 DIAGNOSIS — E1142 Type 2 diabetes mellitus with diabetic polyneuropathy: Secondary | ICD-10-CM | POA: Diagnosis present

## 2015-07-06 DIAGNOSIS — M6289 Other specified disorders of muscle: Secondary | ICD-10-CM | POA: Diagnosis not present

## 2015-07-06 DIAGNOSIS — Z7902 Long term (current) use of antithrombotics/antiplatelets: Secondary | ICD-10-CM | POA: Diagnosis not present

## 2015-07-06 DIAGNOSIS — I25110 Atherosclerotic heart disease of native coronary artery with unstable angina pectoris: Secondary | ICD-10-CM | POA: Diagnosis not present

## 2015-07-06 DIAGNOSIS — G40209 Localization-related (focal) (partial) symptomatic epilepsy and epileptic syndromes with complex partial seizures, not intractable, without status epilepticus: Principal | ICD-10-CM | POA: Diagnosis present

## 2015-07-06 DIAGNOSIS — Z833 Family history of diabetes mellitus: Secondary | ICD-10-CM

## 2015-07-06 DIAGNOSIS — R079 Chest pain, unspecified: Secondary | ICD-10-CM

## 2015-07-06 DIAGNOSIS — R7989 Other specified abnormal findings of blood chemistry: Secondary | ICD-10-CM | POA: Diagnosis not present

## 2015-07-06 DIAGNOSIS — Z885 Allergy status to narcotic agent status: Secondary | ICD-10-CM | POA: Diagnosis not present

## 2015-07-06 DIAGNOSIS — Z823 Family history of stroke: Secondary | ICD-10-CM

## 2015-07-06 DIAGNOSIS — R29810 Facial weakness: Secondary | ICD-10-CM | POA: Diagnosis not present

## 2015-07-06 DIAGNOSIS — R531 Weakness: Secondary | ICD-10-CM

## 2015-07-06 HISTORY — DX: Atherosclerotic heart disease of native coronary artery without angina pectoris: I25.10

## 2015-07-06 LAB — DIFFERENTIAL
BASOS ABS: 0 10*3/uL (ref 0.0–0.1)
BASOS PCT: 0 %
EOS ABS: 0.1 10*3/uL (ref 0.0–0.7)
Eosinophils Relative: 1 %
Lymphocytes Relative: 35 %
Lymphs Abs: 1.9 10*3/uL (ref 0.7–4.0)
MONOS PCT: 9 %
Monocytes Absolute: 0.5 10*3/uL (ref 0.1–1.0)
Neutro Abs: 3 10*3/uL (ref 1.7–7.7)
Neutrophils Relative %: 55 %

## 2015-07-06 LAB — URINALYSIS, ROUTINE W REFLEX MICROSCOPIC
Bilirubin Urine: NEGATIVE
Hgb urine dipstick: NEGATIVE
Ketones, ur: NEGATIVE mg/dL
LEUKOCYTES UA: NEGATIVE
Nitrite: NEGATIVE
PH: 8 (ref 5.0–8.0)
Protein, ur: 30 mg/dL — AB
SPECIFIC GRAVITY, URINE: 1.011 (ref 1.005–1.030)
Urobilinogen, UA: 0.2 mg/dL (ref 0.0–1.0)

## 2015-07-06 LAB — I-STAT CHEM 8, ED
BUN: 14 mg/dL (ref 6–20)
CALCIUM ION: 1.13 mmol/L (ref 1.13–1.30)
CHLORIDE: 99 mmol/L — AB (ref 101–111)
CREATININE: 0.7 mg/dL (ref 0.44–1.00)
GLUCOSE: 284 mg/dL — AB (ref 65–99)
HCT: 43 % (ref 36.0–46.0)
Hemoglobin: 14.6 g/dL (ref 12.0–15.0)
Potassium: 4.3 mmol/L (ref 3.5–5.1)
Sodium: 139 mmol/L (ref 135–145)
TCO2: 24 mmol/L (ref 0–100)

## 2015-07-06 LAB — CBC
HEMATOCRIT: 37.9 % (ref 36.0–46.0)
HEMOGLOBIN: 12.4 g/dL (ref 12.0–15.0)
MCH: 30.4 pg (ref 26.0–34.0)
MCHC: 32.7 g/dL (ref 30.0–36.0)
MCV: 92.9 fL (ref 78.0–100.0)
Platelets: 196 10*3/uL (ref 150–400)
RBC: 4.08 MIL/uL (ref 3.87–5.11)
RDW: 13.2 % (ref 11.5–15.5)
WBC: 5.5 10*3/uL (ref 4.0–10.5)

## 2015-07-06 LAB — GLUCOSE, CAPILLARY: GLUCOSE-CAPILLARY: 202 mg/dL — AB (ref 65–99)

## 2015-07-06 LAB — RAPID URINE DRUG SCREEN, HOSP PERFORMED
Amphetamines: NOT DETECTED
BENZODIAZEPINES: NOT DETECTED
Barbiturates: NOT DETECTED
COCAINE: NOT DETECTED
OPIATES: NOT DETECTED
Tetrahydrocannabinol: NOT DETECTED

## 2015-07-06 LAB — COMPREHENSIVE METABOLIC PANEL
ALT: 23 U/L (ref 14–54)
AST: 34 U/L (ref 15–41)
Albumin: 4.3 g/dL (ref 3.5–5.0)
Alkaline Phosphatase: 57 U/L (ref 38–126)
Anion gap: 13 (ref 5–15)
BUN: 12 mg/dL (ref 6–20)
CHLORIDE: 100 mmol/L — AB (ref 101–111)
CO2: 24 mmol/L (ref 22–32)
Calcium: 10 mg/dL (ref 8.9–10.3)
Creatinine, Ser: 0.79 mg/dL (ref 0.44–1.00)
Glucose, Bld: 286 mg/dL — ABNORMAL HIGH (ref 65–99)
POTASSIUM: 4.3 mmol/L (ref 3.5–5.1)
Sodium: 137 mmol/L (ref 135–145)
Total Bilirubin: 0.5 mg/dL (ref 0.3–1.2)
Total Protein: 7.9 g/dL (ref 6.5–8.1)

## 2015-07-06 LAB — I-STAT TROPONIN, ED: TROPONIN I, POC: 0.02 ng/mL (ref 0.00–0.08)

## 2015-07-06 LAB — PROTIME-INR
INR: 1.06 (ref 0.00–1.49)
Prothrombin Time: 14 seconds (ref 11.6–15.2)

## 2015-07-06 LAB — TROPONIN I: Troponin I: 0.89 ng/mL (ref <0.031)

## 2015-07-06 LAB — MRSA PCR SCREENING: MRSA by PCR: NEGATIVE

## 2015-07-06 LAB — URINE MICROSCOPIC-ADD ON

## 2015-07-06 LAB — ETHANOL

## 2015-07-06 LAB — APTT: aPTT: 29 seconds (ref 24–37)

## 2015-07-06 MED ORDER — SODIUM CHLORIDE 0.9 % IV SOLN: 250.0000 mL | INTRAVENOUS | Status: DC | PRN

## 2015-07-06 MED ORDER — BRIMONIDINE TARTRATE 0.2 % OP SOLN
1.0000 [drp] | Freq: Two times a day (BID) | OPHTHALMIC | Status: DC
  Administered 2015-07-06 – 2015-07-10 (×8): 1 [drp] via OPHTHALMIC
  Filled 2015-07-06: qty 5

## 2015-07-06 MED ORDER — ATORVASTATIN CALCIUM 20 MG PO TABS
20.0000 mg | ORAL_TABLET | Freq: Every day | ORAL | Status: DC
  Administered 2015-07-06: 20 mg via ORAL
  Filled 2015-07-06: qty 1

## 2015-07-06 MED ORDER — ACETAMINOPHEN 325 MG PO TABS
650.0000 mg | ORAL_TABLET | Freq: Four times a day (QID) | ORAL | Status: DC | PRN
  Administered 2015-07-07 (×2): 650 mg via ORAL
  Filled 2015-07-06 (×2): qty 2

## 2015-07-06 MED ORDER — CLOPIDOGREL BISULFATE 75 MG PO TABS
75.0000 mg | ORAL_TABLET | Freq: Every day | ORAL | Status: DC
  Administered 2015-07-07 – 2015-07-10 (×4): 75 mg via ORAL
  Filled 2015-07-06 (×4): qty 1

## 2015-07-06 MED ORDER — SODIUM CHLORIDE 0.9 % IJ SOLN
3.0000 mL | Freq: Two times a day (BID) | INTRAMUSCULAR | Status: DC
  Administered 2015-07-06 – 2015-07-10 (×6): 3 mL via INTRAVENOUS

## 2015-07-06 MED ORDER — ACETAMINOPHEN 650 MG RE SUPP: 650.0000 mg | Freq: Four times a day (QID) | RECTAL | Status: DC | PRN

## 2015-07-06 MED ORDER — LATANOPROST 0.005 % OP SOLN
1.0000 [drp] | Freq: Every day | OPHTHALMIC | Status: DC
  Administered 2015-07-06 – 2015-07-09 (×4): 1 [drp] via OPHTHALMIC
  Filled 2015-07-06: qty 2.5

## 2015-07-06 MED ORDER — INSULIN GLARGINE 100 UNIT/ML ~~LOC~~ SOLN
12.0000 [IU] | Freq: Every day | SUBCUTANEOUS | Status: DC
  Administered 2015-07-06 – 2015-07-08 (×3): 12 [IU] via SUBCUTANEOUS
  Filled 2015-07-06 (×4): qty 0.12

## 2015-07-06 MED ORDER — ONDANSETRON HCL 4 MG/2ML IJ SOLN: 4.0000 mg | Freq: Four times a day (QID) | INTRAMUSCULAR | Status: DC | PRN

## 2015-07-06 MED ORDER — ONDANSETRON HCL 4 MG PO TABS: 4.0000 mg | ORAL_TABLET | Freq: Four times a day (QID) | ORAL | Status: DC | PRN

## 2015-07-06 MED ORDER — ASPIRIN 81 MG PO CHEW
324.0000 mg | CHEWABLE_TABLET | Freq: Once | ORAL | Status: AC
  Administered 2015-07-06: 324 mg via ORAL
  Filled 2015-07-06: qty 4

## 2015-07-06 MED ORDER — ATORVASTATIN CALCIUM 40 MG PO TABS
40.0000 mg | ORAL_TABLET | Freq: Every day | ORAL | Status: DC
  Administered 2015-07-07 – 2015-07-09 (×3): 40 mg via ORAL
  Filled 2015-07-06 (×5): qty 1

## 2015-07-06 MED ORDER — NITROGLYCERIN 0.4 MG SL SUBL: 0.4000 mg | SUBLINGUAL_TABLET | Freq: Once | SUBLINGUAL | Status: DC

## 2015-07-06 MED ORDER — GI COCKTAIL ~~LOC~~: 30.0000 mL | Freq: Three times a day (TID) | ORAL | Status: DC | PRN

## 2015-07-06 MED ORDER — HEPARIN (PORCINE) IN NACL 100-0.45 UNIT/ML-% IJ SOLN
1050.0000 [IU]/h | INTRAMUSCULAR | Status: DC
  Administered 2015-07-07: 800 [IU]/h via INTRAVENOUS
  Administered 2015-07-09: 1050 [IU]/h via INTRAVENOUS
  Filled 2015-07-06 (×5): qty 250

## 2015-07-06 MED ORDER — FAMOTIDINE 20 MG PO TABS
20.0000 mg | ORAL_TABLET | Freq: Every day | ORAL | Status: DC
  Administered 2015-07-06 – 2015-07-10 (×5): 20 mg via ORAL
  Filled 2015-07-06 (×4): qty 1

## 2015-07-06 MED ORDER — ASPIRIN EC 81 MG PO TBEC
81.0000 mg | DELAYED_RELEASE_TABLET | Freq: Every day | ORAL | Status: DC
  Administered 2015-07-07 – 2015-07-09 (×3): 81 mg via ORAL
  Filled 2015-07-06 (×5): qty 1

## 2015-07-06 MED ORDER — HEPARIN SODIUM (PORCINE) 5000 UNIT/ML IJ SOLN
5000.0000 [IU] | Freq: Three times a day (TID) | INTRAMUSCULAR | Status: DC
  Administered 2015-07-06: 5000 [IU] via SUBCUTANEOUS
  Filled 2015-07-06: qty 1

## 2015-07-06 MED ORDER — NITROGLYCERIN 0.4 MG SL SUBL
SUBLINGUAL_TABLET | SUBLINGUAL | Status: AC
  Filled 2015-07-06: qty 1

## 2015-07-06 MED ORDER — SODIUM CHLORIDE 0.9 % IV SOLN
INTRAVENOUS | Status: AC
Start: 2015-07-06 — End: 2015-07-07
  Administered 2015-07-06 – 2015-07-07 (×2): via INTRAVENOUS

## 2015-07-06 MED ORDER — NITROGLYCERIN 0.4 MG SL SUBL: 0.4000 mg | SUBLINGUAL_TABLET | SUBLINGUAL | Status: DC | PRN

## 2015-07-06 MED ORDER — NITROGLYCERIN 2 % TD OINT
1.0000 [in_us] | TOPICAL_OINTMENT | Freq: Once | TRANSDERMAL | Status: DC
  Filled 2015-07-06: qty 30

## 2015-07-06 MED ORDER — STROKE: EARLY STAGES OF RECOVERY BOOK
Freq: Once | Status: AC
  Administered 2015-07-06: 21:00:00
  Filled 2015-07-06: qty 1

## 2015-07-06 MED ORDER — SODIUM CHLORIDE 0.9 % IJ SOLN
3.0000 mL | Freq: Two times a day (BID) | INTRAMUSCULAR | Status: DC
  Administered 2015-07-06 – 2015-07-07 (×3): 3 mL via INTRAVENOUS

## 2015-07-06 MED ORDER — INSULIN ASPART 100 UNIT/ML ~~LOC~~ SOLN
0.0000 [IU] | Freq: Three times a day (TID) | SUBCUTANEOUS | Status: DC
  Administered 2015-07-07 (×2): 3 [IU] via SUBCUTANEOUS
  Administered 2015-07-07: 5 [IU] via SUBCUTANEOUS
  Administered 2015-07-08 (×2): 3 [IU] via SUBCUTANEOUS
  Administered 2015-07-08: 11 [IU] via SUBCUTANEOUS
  Administered 2015-07-09: 8 [IU] via SUBCUTANEOUS
  Administered 2015-07-09: 11 [IU] via SUBCUTANEOUS
  Administered 2015-07-09: 3 [IU] via SUBCUTANEOUS
  Administered 2015-07-10: 5 [IU] via SUBCUTANEOUS
  Administered 2015-07-10: 2 [IU] via SUBCUTANEOUS

## 2015-07-06 MED ORDER — INSULIN ASPART 100 UNIT/ML ~~LOC~~ SOLN
0.0000 [IU] | Freq: Every day | SUBCUTANEOUS | Status: DC
  Administered 2015-07-06 – 2015-07-08 (×2): 2 [IU] via SUBCUTANEOUS

## 2015-07-06 MED ORDER — TIMOLOL HEMIHYDRATE 0.5 % OP SOLN
1.0000 [drp] | Freq: Two times a day (BID) | OPHTHALMIC | Status: DC
  Administered 2015-07-06 – 2015-07-09 (×7): 1 [drp] via OPHTHALMIC
  Filled 2015-07-06: qty 5

## 2015-07-06 MED ORDER — SODIUM CHLORIDE 0.9 % IJ SOLN: 3.0000 mL | INTRAMUSCULAR | Status: DC | PRN

## 2015-07-06 MED ORDER — HYDRALAZINE HCL 20 MG/ML IJ SOLN: 5.0000 mg | INTRAMUSCULAR | Status: DC | PRN

## 2015-07-06 NOTE — Significant Event (Addendum)
Rapid Response Event Note  Overview:  Called to see patient as Code Stroke. See team documentation.    Initial Focused Assessment: Symptoms of facial droop, dysarthria and aphasia intermittent. Complaints of intermittent chest pain. History of angina and TIAs. Patient alert and oriented. Able to inform medical team of nature of symptoms and condition.   Interventions: Assisted with CT exam and NIHSS. For medical admission.  Event Summary:   at      at          Leah Small, Leah Small

## 2015-07-06 NOTE — Progress Notes (Signed)
Pt received from ED. Alert. No respiratory distress. No complaints of pain. Will continue to monitor pt.

## 2015-07-06 NOTE — ED Notes (Signed)
MRI to be called to come transport pt after pharmacy tech speaks with her. Per hospitalist, pt is to have pharmacy tech come speak with her regarding her medications before she leaves department for MRI.

## 2015-07-06 NOTE — ED Notes (Signed)
Pt transported to MRI 

## 2015-07-06 NOTE — H&P (Signed)
[facility] History and Physical  Leah Small WUJ:[id number] DOB: 10/08/1940 DOA: 07/06/2015  Referring physician: Dr Rhunette Croft - [facility] PCP: Marguarite Arbour, MD   Chief Complaint: L sided weakness and slurred speech  HPI: Leah Small is a 74 y.o. female  Patient with baseline left-sided weakness which appeared to progress in upper and lower extremity today at around 1330. Additionally patient noted to have some slurred speech at that time. These deficits were noted by patient's son. Symptoms are constant but appear to be slowly improving per the son. Patient also some baseline confusion which seems to be unchanged. Prior to this patient was in her normal state of health.   Review of Systems:  Constitutional:  No weight loss, night sweats, Fevers, chills HEENT:  No headaches, Difficulty swallowing,Tooth/dental problems,Sore throat, Cardio-vascular:  No chest pain, Orthopnea, PND, swelling in lower extremities, anasarca, dizziness, palpitations  GI:  No heartburn, indigestion, abdominal pain, nausea, vomiting, diarrhea, change in bowel habits, loss of appetite  Resp:   No shortness of breath with exertion or at rest. No excess mucus, no productive cough, No non-productive cough, No coughing up of blood.No change in color of mucus.No wheezing.No chest wall deformity  Skin:  no rash or lesions.  GU:  no dysuria, change in color of urine, no urgency or frequency. No flank pain.  Musculoskeletal:  Per HPI, w/o joint swelling Psych:  No change in mood or affect. No depression or anxiety. Neuro: Per HPI  All other systems were reviewed and are negative.  Past Medical History  Diagnosis Date  . Coronary artery disease   . Stroke (HCC)   . Anginal pain (HCC)   . Psoriasis   . Diabetes mellitus without complication [facility])    Past Surgical History  Procedure Laterality Date  . Coronary artery bypass graft     Social History:  reports that she has never smoked. She  does not have any smokeless tobacco history on file. She reports that she does not drink alcohol or use illicit drugs.  Allergies  Allergen Reactions  . Vicodin [Hydrocodone-Acetaminophen] Nausea And Vomiting    Family History  Problem Relation Age of Onset  . Heart attack Father   . Diabetes Mother   . Cancer Mother   . Stroke Mother      Prior to Admission medications   Not on File   Physical Exam: Filed Vitals:   07/06/15 1600 07/06/15 1622 07/06/15 1656 07/06/15 1700  BP: 173/83  148/59 127/61  Pulse: 87  75 74  Temp:  97.8 F (36.6 C)    TempSrc:      Resp: Weight:      SpO2: 95%  100% 98%    Wt Readings from Last 3 Encounters:  07/06/15 71 kg (156 lb 8.4 oz)    General:  Appears calm and comfortable Eyes:  PERRL, EOMI, normal lids, iris ENT:  grossly normal hearing, lips & tongue Neck:  no LAD, masses or thyromegaly Cardiovascular:  RRR, II/VI systolic murmur. Trace LE edema.  Respiratory:  CTA bilaterally, no w/r/r. Normal respiratory effort. Abdomen:  soft, ntnd Skin:  no rash or induration seen on limited exam Musculoskeletal: Weakness on left and upper and lower extremities. Patient unable to sit unassisted. Psychiatric: Alert and oriented to person place and time. Patient with vague understanding of her medical regimen. Neurologic: Cranial nerves II through XII grossly intact. Some expressive aphasia, dysmetria on right and upper extremity, 2/5 strength in the  left and 4/5 in the right, ankle flexion and extension 2/5 in the left and 4/5 on the right          Labs on Admission:  Basic Metabolic Panel:  Recent Labs Lab 07/06/15 1510 07/06/15 1531  NA 137 139  K 4.3 4.3  CL 100* 99*  CO2 24  --   GLUCOSE 286* 284*  BUN 12 14  CREATININE 0.79 0.70  CALCIUM 10.0  --    Liver Function Tests:  Recent Labs Lab 07/06/15 1510  AST 34  ALT 23  ALKPHOS 57  BILITOT 0.5  PROT 7.9  ALBUMIN 4.3   No results for input(s): LIPASE,  AMYLASE in the last 168 hours. No results for input(s): AMMONIA in the last 168 hours. CBC:  Recent Labs Lab 07/06/15 1510 07/06/15 1531  WBC 5.5  --   NEUTROABS 3.0  --   HGB 12.4 14.6  HCT 37.9 43.0  MCV 92.9  --   PLT 196  --    Cardiac Enzymes: No results for input(s): CKTOTAL, CKMB, CKMBINDEX, TROPONINI in the last 168 hours.  BNP (last 3 results) No results for input(s): BNP in the last 8760 hours.  ProBNP (last 3 results) No results for input(s): PROBNP in the last 8760 hours.  CBG: No results for input(s): GLUCAP in the last 168 hours.  Radiological Exams on Admission: Ct Head Wo Contrast  07/06/2015  CLINICAL DATA:  CODE STROKE, DR. Leroy KennedyAMILO [phone], LEFT SIDE WEAKNESS, last seen normal at 1400, HX CVA. EXAM: CT HEAD WITHOUT CONTRAST TECHNIQUE: Contiguous axial images were obtained from the base of the skull through the vertex without intravenous contrast. COMPARISON:  MRI 02/06/2011, CT HEAD 08/09/2010 FINDINGS: There is central and cortical atrophy. There is no intra or extra-axial fluid collection or mass lesion. The basilar cisterns and ventricles have a normal appearance. There is no CT evidence for acute infarction or hemorrhage. Bone windows show mild mucoperiosteal thickening of the ethmoid air cells. No acute calvarial injury. Mastoid air cells are normally aerated. IMPRESSION: 1. Atrophy. 2.  No evidence for acute intracranial abnormality. 3. Mild chronic sinusitis. Critical Value/emergent results were called by telephone at the time of interpretation on 07/06/2015 at 3:41 pm to Dr. Leroy Kennedyamilo, who verbally acknowledged these results. Electronically Signed   By: Norva PavlovElizabeth  Brown M.D.   On: 07/06/2015 15:41    Assessment/Plan Active Problems:   Stroke (HCC)   Diabetes mellitus with complication (HCC)   Essential hypertension   HLD (hyperlipidemia)   Chest pain   Stroke/TIA:  H/o Stroke w/ LUE and LLE weakness at baseline. Acute worsening on day of admission  but improving. Head CT w/o evidence of acute intracranial process.  - Tele - PT/OT - MRI/MRA - Echo - nurse swallow screen - permissive HTN - Neuro following - Neuro checks  HTN: Hypertensive at time of arrival. Allow permissive hypertension. - con Metop, Norvasc, Imdur, in am - Hydralazine PRN SBP >210/110  CP: HEART score 6. intermittent. Worse than baseline. Currently resolved. Few non-specific EKG changes but no sign of ACS. H/o significant GERD. H/o CAD s/p CABG - cycle trop - Tele - EKG in am - GI cocktail - cont ASA and plavix as above  DM: - SSI - continue half dose lantus - A1c  HLD: - cont statin  Code Status: FULL  DVT Prophylaxis: Hep Family Communication: son Disposition Plan:  Pending Improvement    MERRELL, DAVID J, MD Family Medicine [facility] [email or web address] Password [email or web address]

## 2015-07-06 NOTE — Consult Note (Signed)
Referring Physician: ED    Chief Complaint: code stroke, left face weakness, language impairment  HPI:                                                                                                                                         Leah Small is an 74 y.o. 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 At baseline patient haas left arm and leg weakness residual from prior stroke but reportedly is high functioning. Today, she was home with her son and was noted to have left face droopiness and word finding difficulty which family never noticed before. EMS was called and such findings were confirmed. She denies associated HA, vertigo, double vision, difficulty swallowing, confusion, or vision impairment. Complains of mid sternal chest pain similar to her episodic angina. NIHSS 8. CT brain was personally reviewed and showed no acute abnormality. Stated that she takes aspirin + plavix daily.  Date last known well:  Time last known well:  tPA Given: no, only new finding is mild face weakness and dysarthria that comes and goes, remaining deficits are allegedly old. NIHSS: 8 MRS: 0  Past Medical History  Diagnosis Date  . Coronary artery disease   . Stroke [facility])     Past Surgical History  Procedure Laterality Date  . Coronary stent placement      No family history on file. Social History:  has no tobacco, alcohol, and drug history on file. Family history: no MS, epilepsy, or brain tumor Allergies:  Allergies  Allergen Reactions  . Vicodin [Hydrocodone-Acetaminophen] Nausea And Vomiting    Medications:                                                                                                                           I have reviewed the patient's current medications.  ROS:  History obtained from chart review and the patient  General ROS: negative for - chills, fatigue, fever, night sweats, weight gain or weight loss Psychological ROS: negative for - behavioral disorder, hallucinations, memory difficulties, mood swings or suicidal ideation Ophthalmic ROS: negative for - blurry vision, double vision, eye pain or loss of vision ENT ROS: negative for - epistaxis, nasal discharge, oral lesions, sore throat, tinnitus or vertigo Allergy and Immunology ROS: negative for - hives or itchy/watery eyes Hematological and Lymphatic ROS: negative for - bleeding problems, bruising or swollen lymph nodes Endocrine ROS: negative for - galactorrhea, hair pattern changes, polydipsia/polyuria or temperature intolerance Respiratory ROS: negative for - cough, hemoptysis, shortness of breath or wheezing Cardiovascular ROS: negative for - dyspnea on exertion, edema or irregular heartbeat Gastrointestinal ROS: negative for - abdominal pain, diarrhea, hematemesis, nausea/vomiting or stool incontinence Genito-Urinary ROS: negative for - dysuria, hematuria, incontinence or urinary frequency/urgency Musculoskeletal ROS: negative for - joint swelling or muscular weakness Neurological ROS: as noted in HPI Dermatological ROS: negative for rash and skin lesion changes  Physical exam:  Constitutional: well developed, pleasant female in no apparent distress. Blood pressure 177/86, pulse 84, temperature 97.8 F (36.6 C), temperature source Temporal, resp. rate 22, weight 71 kg (156 lb 8.4 oz), SpO2 100 %. Eyes: no jaundice or exophthalmos.  Head: normocephalic. Neck: supple, no bruits, no JVD. Cardiac: no murmurs. Lungs: clear. Abdomen: soft, no tender, no mass. Extremities: no edema, clubbing, or cyanosis.  Skin: no rash  Neurologic Examination:                                                                                                       General: NAD Mental Status: Alert, oriented, thought content appropriate.  Speech is slurred at times but also seems to stutter. No evidence of aphasia.  Able to follow 3 step commands without difficulty. Cranial Nerves: II: Discs flat bilaterally; Visual fields grossly normal, pupils equal, round, reactive to light and accommodation III,IV, VI: ptosis not present, extra-ocular motions intact bilaterally V,VII: smile asymmetric due to mild left lower face weakness, facial light touch sensation normal bilaterally VIII: hearing normal bilaterally IX,X: uvula rises symmetrically XI: bilateral shoulder shrug XII: midline tongue extension without atrophy or fasciculations Motor: Significant for left hemiparesis, old Sensory: Pinprick and light touch intact throughout, bilaterally Deep Tendon Reflexes:  Right: Upper Extremity   Left: Upper extremity   biceps (C-5 to C-6) 2/4   biceps (C-5 to C-6) 2/4 tricep (C7) 2/4    triceps (C7) 2/4 Brachioradialis (C6) 2/4  Brachioradialis (C6) 2/4  Lower Extremity Lower Extremity  quadriceps (L-2 to L-4) 2/4   quadriceps (L-2 to L-4) 2/4 Achilles (S1) 2/4   Achilles (S1) 2/4  Plantars: Right: downgoing   Left: downgoing Cerebellar: normal finger-to-nose,  normal heel-to-shin test Gait:  No tested due to multiple leads    Results for orders placed or performed during the hospital encounter of 07/06/15 (from the past 48 hour(s))  Protime-INR     Status: None   Collection Time: 07/06/15  3:10 PM  Result Value Ref  Range   Prothrombin Time 14.0 11.6 - 15.2 seconds   INR 1.06 0.00 - 1.49  APTT     Status: None   Collection Time: 07/06/15  3:10 PM  Result Value Ref Range   aPTT 29 24 - 37 seconds  CBC     Status: None   Collection Time: 07/06/15  3:10 PM  Result Value Ref Range   WBC 5.5 4.0 - 10.5 K/uL   RBC 4.08 3.87 - 5.11 MIL/uL   Hemoglobin 12.4 12.0 - 15.0 g/dL   HCT 96.0 45.4 - 09.8 %    MCV 92.9 78.0 - 100.0 fL   MCH 30.4 26.0 - 34.0 pg   MCHC 32.7 30.0 - 36.0 g/dL   RDW 11.9 14.7 - 82.9 %   Platelets 196 150 - 400 K/uL  Differential     Status: None   Collection Time: 07/06/15  3:10 PM  Result Value Ref Range   Neutrophils Relative % 55 %   Neutro Abs 3.0 1.7 - 7.7 K/uL   Lymphocytes Relative 35 %   Lymphs Abs 1.9 0.7 - 4.0 K/uL   Monocytes Relative 9 %   Monocytes Absolute 0.5 0.1 - 1.0 K/uL   Eosinophils Relative 1 %   Eosinophils Absolute 0.1 0.0 - 0.7 K/uL   Basophils Relative 0 %   Basophils Absolute 0.0 0.0 - 0.1 K/uL  I-stat troponin, ED (not at [facility], [facility])     Status: None   Collection Time: 07/06/15  3:29 PM  Result Value Ref Range   Troponin i, poc 0.02 0.00 - 0.08 ng/mL   Comment 3            Comment: Due to the release kinetics of cTnI, a negative result within the first hours of the onset of symptoms does not rule out myocardial infarction with certainty. If myocardial infarction is still suspected, repeat the test at appropriate intervals.   I-Stat Chem 8, ED  (not at [facility], [facility])     Status: Abnormal   Collection Time: 07/06/15  3:31 PM  Result Value Ref Range   Sodium 139 135 - 145 mmol/L   Potassium 4.3 3.5 - 5.1 mmol/L   Chloride 99 (L) 101 - 111 mmol/L   BUN 14 6 - 20 mg/dL   Creatinine, Ser 5.62 0.44 - 1.00 mg/dL   Glucose, Bld 130 (H) 65 - 99 mg/dL   Calcium, Ion 8.65 7.84 - 1.30 mmol/L   TCO2 24 0 - 100 mmol/L   Hemoglobin 14.6 12.0 - 15.0 g/dL   HCT 69.6 29.5 - 28.4 %   Ct Head Wo Contrast  07/06/2015  CLINICAL DATA:  CODE STROKE, DR. Leroy Kennedy [phone], LEFT SIDE WEAKNESS, last seen normal at 1400, HX CVA. EXAM: CT HEAD WITHOUT CONTRAST TECHNIQUE: Contiguous axial images were obtained from the base of the skull through the vertex without intravenous contrast. COMPARISON:  MRI 02/06/2011, CT HEAD 08/09/2010 FINDINGS: There is central and cortical atrophy. There is no intra or extra-axial fluid collection or mass lesion. The basilar  cisterns and ventricles have a normal appearance. There is no CT evidence for acute infarction or hemorrhage. Bone windows show mild mucoperiosteal thickening of the ethmoid air cells. No acute calvarial injury. Mastoid air cells are normally aerated. IMPRESSION: 1. Atrophy. 2.  No evidence for acute intracranial abnormality. 3. Mild chronic sinusitis. Critical Value/emergent results were called by telephone at the time of interpretation on 07/06/2015 at 3:41 pm to Dr. Leroy Kennedy, who verbally acknowledged these results.  Electronically Signed   By: Norva Pavlovown M.D.   On: 07/06/2015 15:41     Assessment: 74 y.o. female brought in with acute onset left face weakness and word finding difficulty. Suspect right subcortical infarct. NIHSS 8, but her deficits are old except for speech impediment and mild left face weakness, and thus did not treat her with thrombolytic therapy or endovascular intervention. Admit to medicine and complete stroke work up. She is already on dual antiplatelet therapy and will continue this pending results stroke work up. Stroke team will follow up tomorrow.  Stroke Risk Factors - age, CAD, stroke  Plan: 1. HgbA1c, fasting lipid panel 2. MRI, MRA  of the brain without contrast 3. Echocardiogram 4. Carotid dopplers 5. Prophylactic therapy-aspirin and plavix 6. Risk factor modification 7. Telemetry monitoring 8. Frequent neuro checks 9. PT/OT SLP 10. NPO   Wyatt Portela, MD [facility] [phone]  07/06/2015, 3:55 PM

## 2015-07-06 NOTE — ED Provider Notes (Signed)
CSN: [id number]     Arrival date & time 07/06/15  1517 History   First MD Initiated Contact with Patient 07/06/15 1530     Chief Complaint  Patient presents with  . Code Stroke    An emergency department physician performed an initial assessment on this suspected stroke patient at 34. (Consider location/radiation/quality/duration/timing/severity/associated sxs/prior Treatment) HPI Comments: Pt with hx of CAD, Strokes with L sided deficits comes in with new/worsening L sided weakness. Last normal 2 pm. Pt states that she had slurred speech with worsening weakness on the L side. Pt also reports hx of CAD with poor coronaries, allegedly "not amenable" to any intervention. The chest pain is typical of her recurrent angina, and is not different that her angina.   ROS 10 Systems reviewed and are negative for acute change except as noted in the HPI.     The history is provided by the patient and medical records.    Past Medical History  Diagnosis Date  . Coronary artery disease   . Stroke (HCC)   . Anginal pain [facility])    Past Surgical History  Procedure Laterality Date  . Coronary artery bypass graft     Family History  Problem Relation Age of Onset  . Heart attack Father   . Diabetes Mother   . Cancer Mother   . Stroke Mother    Social History  Substance Use Topics  . Smoking status: Never Smoker   . Smokeless tobacco: None  . Alcohol Use: No   OB History    No data available     Review of Systems    Allergies  Vicodin  Home Medications   Prior to Admission medications   Not on File   BP 173/83 mmHg  Pulse 87  Temp(Src) 97.8 F (36.6 C) (Temporal)  Resp 18  Wt 156 lb 8.4 oz (71 kg)  SpO2 95% Physical Exam  Constitutional: She is oriented to person, place, and time. She appears well-developed and well-nourished.  HENT:  Head: Normocephalic and atraumatic.  Eyes: EOM are normal. Pupils are equal, round, and reactive to light.  Neck: Neck supple.   Cardiovascular: Normal rate, regular rhythm and normal heart sounds.   Pulmonary/Chest: Effort normal. No respiratory distress.  Abdominal: Soft. She exhibits no distension. There is no tenderness. There is no rebound and no guarding.  Neurological: She is alert and oriented to person, place, and time.  NIHSS: 4 1: Slight L facial droop 1: LUE strength 1: LLE strength 1: mild L sided numbness  Skin: Skin is warm and dry.  Nursing note and vitals reviewed.   ED Course  Procedures (including critical care time) Labs Review Labs Reviewed  COMPREHENSIVE METABOLIC PANEL - Abnormal; Notable for the following:    Chloride 100 (*)    Glucose, Bld 286 (*)    All other components within normal limits  I-STAT CHEM 8, ED - Abnormal; Notable for the following:    Chloride 99 (*)    Glucose, Bld 284 (*)    All other components within normal limits  PROTIME-INR  APTT  CBC  DIFFERENTIAL  ETHANOL  URINE RAPID DRUG SCREEN, HOSP PERFORMED  URINALYSIS, ROUTINE W REFLEX MICROSCOPIC (NOT AT [facility])  HEMOGLOBIN A1C  LIPID PANEL  I-STAT TROPOININ, ED    Imaging Review Ct Head Wo Contrast  07/06/2015  CLINICAL DATA:  CODE STROKE, DR. Leroy Kennedy [phone], LEFT SIDE WEAKNESS, last seen normal at 1400, HX CVA. EXAM: CT HEAD WITHOUT CONTRAST TECHNIQUE: Contiguous axial  images were obtained from the base of the skull through the vertex without intravenous contrast. COMPARISON:  MRI 02/06/2011, CT HEAD 08/09/2010 FINDINGS: There is central and cortical atrophy. There is no intra or extra-axial fluid collection or mass lesion. The basilar cisterns and ventricles have a normal appearance. There is no CT evidence for acute infarction or hemorrhage. Bone windows show mild mucoperiosteal thickening of the ethmoid air cells. No acute calvarial injury. Mastoid air cells are normally aerated. IMPRESSION: 1. Atrophy. 2.  No evidence for acute intracranial abnormality. 3. Mild chronic sinusitis. Critical  Value/emergent results were called by telephone at the time of interpretation on 07/06/2015 at 3:41 pm to Dr. Leroy Kennedyamilo, who verbally acknowledged these results. Electronically Signed   By: Norva PavlovElizabeth  Brown M.D.   On: 07/06/2015 15:41   I have personally reviewed and evaluated these images and lab results as part of my medical decision-making.   EKG Interpretation   Date/Time:  Saturday July 06 2015 15:34:27 EST Ventricular Rate:  85 PR Interval:  138 QRS Duration: 124 QT Interval:  394 QTC Calculation: 468 R Axis:   -35 Text Interpretation:  Sinus rhythm LVH with secondary repolarization  abnormality Anterior Q waves, possibly due to LVH new q wave v2  Nonspecific ST and T wave abnormality No significant change since last  tracing Confirmed by Rhunette CroftNANAVATI, MD, Janey GentaANKIT [phone]([id number]) on 07/06/2015 3:49:19 PM      MDM   Final diagnoses:  Stroke-like symptoms  Left-sided weakness  Angina pectoris (HCC)    DDx includes:  Stroke - ischemic vs. hemorrhagic TIA Neuropathy Myelitis Electrolyte abnormality Neuropathy Muscular disease  Pt with hx of stroke comes in with L sided numbness and weakness. Clinical concerns for new stroke. Stroke team at the bedside, not a TPA candidate due to hx of strokes and as the symptoms are mild and improving. Admit to medicine for further stroke eval and workup.    Derwood KaplanAnkit Smita Lesh, MD 07/06/15 [id number]

## 2015-07-06 NOTE — Progress Notes (Signed)
ANTICOAGULATION CONSULT NOTE - Initial Consult  Pharmacy Consult for Heparin Indication: chest pain/ACS  Allergies  Allergen Reactions  . Vicodin [Hydrocodone-Acetaminophen] Nausea And Vomiting    Patient Measurements: Height:  (160 cm) Weight: 147 lb 4.3 oz (66.8 kg) IBW/kg (Calculated) : 52.4 Heparin Dosing Weight: 65 kg  Vital Signs: Temp: 97.7 F (36.5 C) (11/12 2334) Temp Source: Oral (11/12 2334) BP: 91/46 mmHg (11/12 2334) Pulse Rate: 62 (11/12 2334)  Labs:  Recent Labs  07/06/15 1510 07/06/15 1531 07/06/15 2038  HGB 12.4 14.6  --   HCT 37.9 43.0  --   PLT 196  --   --   APTT 29  --   --   LABPROT 14.0  --   --   INR 1.06  --   --   CREATININE 0.79 0.70  --   TROPONINI  --   --  0.89*    Estimated Creatinine Clearance: 55.8 mL/min (by C-G formula based on Cr of 0.7).   Medical History: Past Medical History  Diagnosis Date  . Coronary artery disease   . Stroke (HCC)   . Anginal pain (HCC)   . Psoriasis   . Diabetes mellitus without complication (HCC)     Medications:  Prescriptions prior to admission  Medication Sig Dispense Refill Last Dose  . acetaminophen (TYLENOL ARTHRITIS PAIN) 650 MG CR tablet Take 650-1,300 mg by mouth every 8 (eight) hours as needed for pain.   few days ago  . amLODipine (NORVASC) 2.5 MG tablet Take 2.5 mg by mouth at bedtime.   07/05/2015 at Unknown time  . aspirin EC 81 MG tablet Take 81 mg by mouth at bedtime.   07/05/2015 at Unknown time  . atorvastatin (LIPITOR) 20 MG tablet Take 20 mg by mouth at bedtime.   07/05/2015 at Unknown time  . bimatoprost (LUMIGAN) 0.01 % SOLN Place 1 drop into both eyes at bedtime.   07/05/2015 at Unknown time  . brimonidine (ALPHAGAN) 0.2 % ophthalmic solution Place 1 drop into both eyes 2 (two) times daily.    07/06/2015 at am  . CALCIUM PO Take 1 tablet by mouth daily.   07/06/2015 at Unknown time  . clopidogrel (PLAVIX) 75 MG tablet Take 75 mg by mouth daily.   07/06/2015 at  Unknown time  . glipiZIDE (GLUCOTROL XL) 10 MG 24 hr tablet Take 10 mg by mouth daily with breakfast.   07/06/2015 at Unknown time  . Insulin Glargine (LANTUS SOLOSTAR) 100 UNIT/ML Solostar Pen Inject 24 Units into the skin at bedtime.   07/05/2015 at Unknown time  . Insulin Lispro Prot & Lispro (HUMALOG MIX 75/25 KWIKPEN) (75-25) 100 UNIT/ML Kwikpen Inject 6-12 Units into the skin 4 (four) times daily - after meals and at bedtime. Per sliding scale: 6 units plus:   CBG 150-200 add 1 unit, 201-250 add 2 units, 251-300 add 3 units, 301-350 add 4 units 351-400 add 5 units, >400 add 6 units and call MD   07/06/2015 at 1315  . isosorbide mononitrate (IMDUR) 30 MG 24 hr tablet Take 30 mg by mouth at bedtime.   07/05/2015 at Unknown time  . MAGNESIUM PO Take 1 tablet by mouth daily.   07/06/2015 at Unknown time  . metFORMIN (GLUCOPHAGE) 1000 MG tablet Take 1,000 mg by mouth 2 (two) times daily with a meal.   07/06/2015 at breakfast  . metoprolol tartrate (LOPRESSOR) 25 MG tablet Take 12.5 mg by mouth at bedtime.   07/05/2015 at 1930  .  Multiple Vitamins-Minerals (ZINC PO) Take 1 tablet by mouth daily.   07/06/2015 at Unknown time  . nitroGLYCERIN (NITRODUR - DOSED IN MG/24 HR) 0.4 mg/hr patch Place 0.4 mg onto the skin daily.   07/06/2015 at 830  . nitroGLYCERIN (NITROSTAT) 0.4 MG SL tablet Place 0.4 mg under the tongue every 5 (five) minutes as needed for chest pain.   07/06/2015 at 1330  . nystatin cream (MYCOSTATIN) Apply 1 application topically 2 (two) times daily as needed (yeast infection).    maybe 11/11  . OVER THE COUNTER MEDICATION Take 1 tablet by mouth daily as needed (sinus congestion). Coricidin hbp   few days ago  . ranitidine (ZANTAC) 75 MG tablet Take 75 mg by mouth daily as needed for heartburn (acid reflux).   unknown  . timolol (BETIMOL) 0.5 % ophthalmic solution Place 1 drop into both eyes 2 (two) times daily.   07/06/2015 at 630  . triamcinolone ointment (KENALOG) 0.1 % Apply 1  application topically 3 (three) times daily as needed (itching from psoriasis).   07/05/2015 at Unknown time  . vitamin B-12 (CYANOCOBALAMIN) 1000 MCG tablet Take 1,000 mcg by mouth daily.   07/06/2015 at Unknown time  . vitamin E 400 UNIT capsule Take 400 Units by mouth daily.   07/06/2015 at Unknown time    Assessment: 74 y.o. female admitted with CVA, now with elevated cardiac markers/NSTEMI, for heparin.  Last dose of Heparin 5000 units SQ given at 9 pm  Goal of Therapy:  Heparin level 0.3-0.5 Monitor platelets by anticoagulation protocol: Yes   Plan:  Start heparin 800 units/hr Check heparin level in 8 hours.   [name], [name] 07/06/2015,11:43 PM

## 2015-07-06 NOTE — ED Notes (Signed)
Per ems-- pt having new onset L sided facial droop and slurred speech. Pt with hx of L sided deficiets from previous stroke. lsn- 2pm. Pt hypertensive with ems.

## 2015-07-06 NOTE — Consult Note (Signed)
Referring Physician: Dr. Claiborne Billings (Hospitalist) Primary Cardiologist: Dr. Gwen Pounds Reason for Consultation: Elevated Trop  HPI: 74 yo [place] woman with HTN, DM, TIA, PAD, bilateral carotid endarterectomy, severe CAD, CABG in 1997, occlude vein grafts but patent LIMA-LAD on subsequent caths (per notes medical records) and residual disease not amenable to intervention or redo CABG, has had multiple admissions for unstable angina and NSTEMI, presents today with c/p midsternal burning, nausea as well as left side neurologic symptoms (facial drooling). No syncope, bleeding, fever, cough, palpitations, diaphoresis. She reports her sugar was over 300. At present, CP resolved. CT head negative for acute process. ECG suggests ST depression in inf leads and T inv in lateral leads and anterior q waves but no ST elevation. She is on ASA, Plavix, Lipitor and  Low dose Lopressor at the baseline. I don't have her LVEF information available at this time. Trop 0.89   Review of Systems:  10 systems reviewed unremarkable except as noted in HPI    Past Medical History  Diagnosis Date  . Coronary artery disease   . Stroke (HCC)   . Anginal pain (HCC)   . Psoriasis   . Diabetes mellitus without complication (HCC)     Medications Prior to Admission  Medication Sig Dispense Refill  . acetaminophen (TYLENOL ARTHRITIS PAIN) 650 MG CR tablet Take 650-1,300 mg by mouth every 8 (eight) hours as needed for pain.    [name] amLODipine (NORVASC) 2.5 MG tablet Take 2.5 mg by mouth at bedtime.    [name] aspirin EC 81 MG tablet Take 81 mg by mouth at bedtime.    [name] atorvastatin (LIPITOR) 20 MG tablet Take 20 mg by mouth at bedtime.    . bimatoprost (LUMIGAN) 0.01 % SOLN Place 1 drop into both eyes at bedtime.    . brimonidine (ALPHAGAN) 0.2 % ophthalmic solution Place 1 drop into both eyes 2 (two) times daily.     [name] CALCIUM PO Take 1 tablet by mouth daily.    . clopidogrel (PLAVIX) 75 MG tablet Take 75 mg by mouth daily.    [name]  glipiZIDE (GLUCOTROL XL) 10 MG 24 hr tablet Take 10 mg by mouth daily with breakfast.    . Insulin Glargine (LANTUS SOLOSTAR) 100 UNIT/ML Solostar Pen Inject 24 Units into the skin at bedtime.    . Insulin Lispro Prot & Lispro (HUMALOG MIX 75/25 KWIKPEN) (75-25) 100 UNIT/ML Kwikpen Inject 6-12 Units into the skin 4 (four) times daily - after meals and at bedtime. Per sliding scale: 6 units plus:   CBG 150-200 add 1 unit, 201-250 add 2 units, 251-300 add 3 units, 301-350 add 4 units 351-400 add 5 units, >400 add 6 units and call MD    . isosorbide mononitrate (IMDUR) 30 MG 24 hr tablet Take 30 mg by mouth at bedtime.    [name] MAGNESIUM PO Take 1 tablet by mouth daily.    . metFORMIN (GLUCOPHAGE) 1000 MG tablet Take 1,000 mg by mouth 2 (two) times daily with a meal.    . metoprolol tartrate (LOPRESSOR) 25 MG tablet Take 12.5 mg by mouth at bedtime.    . Multiple Vitamins-Minerals (ZINC PO) Take 1 tablet by mouth daily.    . nitroGLYCERIN (NITRODUR - DOSED IN MG/24 HR) 0.4 mg/hr patch Place 0.4 mg onto the skin daily.    . nitroGLYCERIN (NITROSTAT) 0.4 MG SL tablet Place 0.4 mg under the tongue every 5 (five) minutes as needed for chest pain.    [name] nystatin cream (  MYCOSTATIN) Apply 1 application topically 2 (two) times daily as needed (yeast infection).     [name]. OVER THE COUNTER MEDICATION Take 1 tablet by mouth daily as needed (sinus congestion). Coricidin hbp    . ranitidine (ZANTAC) 75 MG tablet Take 75 mg by mouth daily as needed for heartburn (acid reflux).    . timolol (BETIMOL) 0.5 % ophthalmic solution Place 1 drop into both eyes 2 (two) times daily.    [name]. triamcinolone ointment (KENALOG) 0.1 % Apply 1 application topically 3 (three) times daily as needed (itching from psoriasis).    . vitamin B-12 (CYANOCOBALAMIN) 1000 MCG tablet Take 1,000 mcg by mouth daily.    . vitamin E 400 UNIT capsule Take 400 Units by mouth daily.       [name]. aspirin EC  81 mg Oral QHS  . atorvastatin  20 mg Oral QHS  .  brimonidine  1 drop Both Eyes BID  . [START ON 07/07/2015] clopidogrel  75 mg Oral Daily  . famotidine  20 mg Oral Daily  . heparin  5,000 Units Subcutaneous 3 times per day  . [START ON 07/07/2015] insulin aspart  0-15 Units Subcutaneous TID WC  . insulin aspart  0-5 Units Subcutaneous QHS  . insulin glargine  12 Units Subcutaneous QHS  . latanoprost  1 drop Both Eyes QHS  . nitroGLYCERIN      . nitroGLYCERIN  0.4 mg Sublingual Once  . sodium chloride  3 mL Intravenous Q12H  . sodium chloride  3 mL Intravenous Q12H  . timolol  1 drop Both Eyes BID    Infusions: . sodium chloride 75 mL/hr at 07/06/15 2323    Allergies  Allergen Reactions  . Vicodin [Hydrocodone-Acetaminophen] Nausea And Vomiting    Social History   Social History  . Marital Status: Widowed    Spouse Name: N/A  . Number of Children: N/A  . Years of Education: N/A   Occupational History  . Not on file.   Social History Main Topics  . Smoking status: Never Smoker   . Smokeless tobacco: Not on file  . Alcohol Use: No  . Drug Use: No  . Sexual Activity: Not on file   Other Topics Concern  . Not on file   Social History Narrative    Family History  Problem Relation Age of Onset  . Heart attack Father   . Diabetes Mother   . Cancer Mother   . Stroke Mother     PHYSICAL EXAM: Filed Vitals:   07/06/15 2009  BP: 108/53  Pulse: 67  Temp: 97.8 F (36.6 C)  Resp: 17     Intake/Output Summary (Last 24 hours) at 07/06/15 2324 Last data filed at 07/06/15 1604  Gross per 24 hour  Intake      0 ml  Output   1000 ml  Net  -1000 ml    General:  Aake, alert, no distress, son at the bedside HEENT: AT, [place] , EOMI  Neck: supple. no JVD. Carotids 2+ bilat; no bruits. No lymphadenopathy or thryomegaly appreciated. Cor: PMI nondisplaced. Regular rate & rhythm. No rubs, gallops or murmurs. Lungs: clear Abdomen: soft, nontender, nondistended. No hepatosplenomegaly. No bruits or masses. Good bowel  sounds. Extremities: no cyanosis, clubbing, rash, edema Neuro: alert & oriented x 3, cranial nerves grossly intact. moves all 4 extremities w/o difficulty. Affect pleasant.   Results for orders placed or performed during the hospital encounter of 07/06/15 (from the past 24 hour(s))  Ethanol  Status: None   Collection Time: 07/06/15  3:10 PM  Result Value Ref Range   Alcohol, Ethyl (B) <5 <5 mg/dL  Protime-INR     Status: None   Collection Time: 07/06/15  3:10 PM  Result Value Ref Range   Prothrombin Time 14.0 11.6 - 15.2 seconds   INR 1.06 0.00 - 1.49  APTT     Status: None   Collection Time: 07/06/15  3:10 PM  Result Value Ref Range   aPTT 29 24 - 37 seconds  CBC     Status: None   Collection Time: 07/06/15  3:10 PM  Result Value Ref Range   WBC 5.5 4.0 - 10.5 K/uL   RBC 4.08 3.87 - 5.11 MIL/uL   Hemoglobin 12.4 12.0 - 15.0 g/dL   HCT 40.9 81.1 - 91.4 %   MCV 92.9 78.0 - 100.0 fL   MCH 30.4 26.0 - 34.0 pg   MCHC 32.7 30.0 - 36.0 g/dL   RDW 78.2 95.6 - 21.3 %   Platelets 196 150 - 400 K/uL  Differential     Status: None   Collection Time: 07/06/15  3:10 PM  Result Value Ref Range   Neutrophils Relative % 55 %   Neutro Abs 3.0 1.7 - 7.7 K/uL   Lymphocytes Relative 35 %   Lymphs Abs 1.9 0.7 - 4.0 K/uL   Monocytes Relative 9 %   Monocytes Absolute 0.5 0.1 - 1.0 K/uL   Eosinophils Relative 1 %   Eosinophils Absolute 0.1 0.0 - 0.7 K/uL   Basophils Relative 0 %   Basophils Absolute 0.0 0.0 - 0.1 K/uL  Comprehensive metabolic panel     Status: Abnormal   Collection Time: 07/06/15  3:10 PM  Result Value Ref Range   Sodium 137 135 - 145 mmol/L   Potassium 4.3 3.5 - 5.1 mmol/L   Chloride 100 (L) 101 - 111 mmol/L   CO2 24 22 - 32 mmol/L   Glucose, Bld 286 (H) 65 - 99 mg/dL   BUN 12 6 - 20 mg/dL   Creatinine, Ser 0.86 0.44 - 1.00 mg/dL   Calcium 57.8 8.9 - 46.9 mg/dL   Total Protein 7.9 6.5 - 8.1 g/dL   Albumin 4.3 3.5 - 5.0 g/dL   AST 34 15 - 41 U/L   ALT 23 14 - 54  U/L   Alkaline Phosphatase 57 38 - 126 U/L   Total Bilirubin 0.5 0.3 - 1.2 mg/dL   GFR calc non Af Amer >60 >60 mL/min   GFR calc Af Amer >60 >60 mL/min   Anion gap 13 5 - 15  I-stat troponin, ED (not at [facility], [facility])     Status: None   Collection Time: 07/06/15  3:29 PM  Result Value Ref Range   Troponin i, poc 0.02 0.00 - 0.08 ng/mL   Comment 3          I-Stat Chem 8, ED  (not at [facility], [facility])     Status: Abnormal   Collection Time: 07/06/15  3:31 PM  Result Value Ref Range   Sodium 139 135 - 145 mmol/L   Potassium 4.3 3.5 - 5.1 mmol/L   Chloride 99 (L) 101 - 111 mmol/L   BUN 14 6 - 20 mg/dL   Creatinine, Ser 6.29 0.44 - 1.00 mg/dL   Glucose, Bld 528 (H) 65 - 99 mg/dL   Calcium, Ion 4.13 2.44 - 1.30 mmol/L   TCO2 24 0 - 100 mmol/L   Hemoglobin 14.6  12.0 - 15.0 g/dL   HCT 96.0 45.4 - 09.8 %  Urine rapid drug screen (hosp performed)not at [facility]     Status: None   Collection Time: 07/06/15  3:58 PM  Result Value Ref Range   Opiates NONE DETECTED NONE DETECTED   Cocaine NONE DETECTED NONE DETECTED   Benzodiazepines NONE DETECTED NONE DETECTED   Amphetamines NONE DETECTED NONE DETECTED   Tetrahydrocannabinol NONE DETECTED NONE DETECTED   Barbiturates NONE DETECTED NONE DETECTED  Urinalysis, Routine w reflex microscopic (not at [facility])     Status: Abnormal   Collection Time: 07/06/15  3:58 PM  Result Value Ref Range   Color, Urine YELLOW YELLOW   APPearance CLEAR CLEAR   Specific Gravity, Urine 1.011 1.005 - 1.030   pH 8.0 5.0 - 8.0   Glucose, UA >1000 (A) NEGATIVE mg/dL   Hgb urine dipstick NEGATIVE NEGATIVE   Bilirubin Urine NEGATIVE NEGATIVE   Ketones, ur NEGATIVE NEGATIVE mg/dL   Protein, ur 30 (A) NEGATIVE mg/dL   Urobilinogen, UA 0.2 0.0 - 1.0 mg/dL   Nitrite NEGATIVE NEGATIVE   Leukocytes, UA NEGATIVE NEGATIVE  Urine microscopic-add on     Status: None   Collection Time: 07/06/15  3:58 PM  Result Value Ref Range   Squamous Epithelial / LPF RARE RARE  MRSA PCR Screening      Status: None   Collection Time: 07/06/15  6:30 PM  Result Value Ref Range   MRSA by PCR NEGATIVE NEGATIVE  Troponin I     Status: Abnormal   Collection Time: 07/06/15  8:38 PM  Result Value Ref Range   Troponin I 0.89 (HH) <0.031 ng/mL  Glucose, capillary     Status: Abnormal   Collection Time: 07/06/15 10:11 PM  Result Value Ref Range   Glucose-Capillary 202 (H) 65 - 99 mg/dL   Comment 1 Notify RN    Ct Head Wo Contrast  07/06/2015  CLINICAL DATA:  CODE STROKE, DR. Leroy Kennedy [phone], LEFT SIDE WEAKNESS, last seen normal at 1400, HX CVA. EXAM: CT HEAD WITHOUT CONTRAST TECHNIQUE: Contiguous axial images were obtained from the base of the skull through the vertex without intravenous contrast. COMPARISON:  MRI 02/06/2011, CT HEAD 08/09/2010 FINDINGS: There is central and cortical atrophy. There is no intra or extra-axial fluid collection or mass lesion. The basilar cisterns and ventricles have a normal appearance. There is no CT evidence for acute infarction or hemorrhage. Bone windows show mild mucoperiosteal thickening of the ethmoid air cells. No acute calvarial injury. Mastoid air cells are normally aerated. IMPRESSION: 1. Atrophy. 2.  No evidence for acute intracranial abnormality. 3. Mild chronic sinusitis. Critical Value/emergent results were called by telephone at the time of interpretation on 07/06/2015 at 3:41 pm to Dr. Leroy Kennedy, who verbally acknowledged these results. Electronically Signed   By: Norva Pavlov M.D.   On: 07/06/2015 15:41   Mr [name] Wo Contrast  07/06/2015  CLINICAL DATA:  74 y.o. female brought in with acute onset left face weakness and word finding difficulty. Suspect right subcortical infarct. NIHSS 8, but her deficits are old except for speech impediment and mild left face weakness, and thus did not treat her with thrombolytic therapy or endovascular intervention.Admit to medicine and complete stroke work up. She is already on dual antiplatelet therapy and will  continue this pending results stroke work up. EXAM: MRI HEAD WITHOUT CONTRAST MRA HEAD WITHOUT CONTRAST TECHNIQUE: Multiplanar, multiecho pulse sequences of the brain and surrounding structures were obtained without intravenous contrast.  Angiographic images of the head were obtained using MRA technique without contrast. COMPARISON:  CT head earlier today FINDINGS: MRI HEAD FINDINGS No evidence for acute infarction, hemorrhage, mass lesion, hydrocephalus, or extra-axial fluid. Generalized atrophy. Chronic microvascular ischemic change. No midline abnormality. Flow voids are maintained. Extracranial soft tissues unremarkable. Mild chronic sinus disease. BILATERAL cataract extraction. Similar appearance to earlier CT. MRA HEAD FINDINGS Dolichoectatic but widely patent internal carotid arteries. Widely patent basilar artery with vertebrals codominant. No A1 or M1 stenosis of significance. Focal narrowing proximal LEFT P1 PCA segment 50-75% stenosis. Uncertain significance given the lack of acute or chronic LEFT occipital infarction. No cerebellar branch occlusion. No visible saccular aneurysm. IMPRESSION: Atrophy and small vessel disease.  No acute intracranial findings. 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 Electronically Signed   By: Elsie Stain M.D.   On: 07/06/2015 18:42   Mr Brain Wo Contrast  07/06/2015  CLINICAL DATA:  74 y.o. female brought in with acute onset left face weakness and word finding difficulty. Suspect right subcortical infarct. NIHSS 8, but her deficits are old except for speech impediment and mild left face weakness, and thus did not treat her with thrombolytic therapy or endovascular intervention.Admit to medicine and complete stroke work up. She is already on dual antiplatelet therapy and will continue this pending results stroke work up. EXAM: MRI HEAD WITHOUT CONTRAST MRA HEAD WITHOUT CONTRAST TECHNIQUE: Multiplanar, multiecho pulse sequences of the  brain and surrounding structures were obtained without intravenous contrast. Angiographic images of the head were obtained using MRA technique without contrast. COMPARISON:  CT head earlier today FINDINGS: MRI HEAD FINDINGS No evidence for acute infarction, hemorrhage, mass lesion, hydrocephalus, or extra-axial fluid. Generalized atrophy. Chronic microvascular ischemic change. No midline abnormality. Flow voids are maintained. Extracranial soft tissues unremarkable. Mild chronic sinus disease. BILATERAL cataract extraction. Similar appearance to earlier CT. MRA HEAD FINDINGS Dolichoectatic but widely patent internal carotid arteries. Widely patent basilar artery with vertebrals codominant. No A1 or M1 stenosis of significance. Focal narrowing proximal LEFT P1 PCA segment 50-75% stenosis. Uncertain significance given the lack of acute or chronic LEFT occipital infarction. No cerebellar branch occlusion. No visible saccular aneurysm. IMPRESSION: Atrophy and small vessel disease.  No acute intracranial findings. 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 Electronically Signed   By: Elsie Stain M.D.   On: 07/06/2015 18:42     ASSESSMENT:  1. NSTEMI in the setting of severe CAD - stable hemodynamically - no acute HF or unstable arrhythmia - CP free at this time    PLAN/DISCUSSION:  - agree with continuation of ASA, plavix - would increase dose of Lipitor from 20 to 40 qhs  - Heparin infusion without bolus (just received subq dose) - serial Trop, echo  - need to obtain records from Dr. Philemon Kingdom office (any recent echo, cath and myocardial perfusion scans)   Cardiology will follow.   Thanks for the consult  Nevin Bloodgood, MD

## 2015-07-06 NOTE — ED Notes (Signed)
Hospitalist at bedside 

## 2015-07-07 ENCOUNTER — Inpatient Hospital Stay (HOSPITAL_COMMUNITY): Payer: Medicare Other

## 2015-07-07 DIAGNOSIS — I6789 Other cerebrovascular disease: Secondary | ICD-10-CM

## 2015-07-07 DIAGNOSIS — M6289 Other specified disorders of muscle: Secondary | ICD-10-CM

## 2015-07-07 DIAGNOSIS — F4489 Other dissociative and conversion disorders: Secondary | ICD-10-CM | POA: Insufficient documentation

## 2015-07-07 DIAGNOSIS — R413 Other amnesia: Secondary | ICD-10-CM

## 2015-07-07 LAB — COMPREHENSIVE METABOLIC PANEL
ALT: 18 U/L (ref 14–54)
ANION GAP: 8 (ref 5–15)
AST: 25 U/L (ref 15–41)
Albumin: 3.7 g/dL (ref 3.5–5.0)
Alkaline Phosphatase: 52 U/L (ref 38–126)
BILIRUBIN TOTAL: 0.7 mg/dL (ref 0.3–1.2)
BUN: 10 mg/dL (ref 6–20)
CHLORIDE: 103 mmol/L (ref 101–111)
CO2: 26 mmol/L (ref 22–32)
Calcium: 9.2 mg/dL (ref 8.9–10.3)
Creatinine, Ser: 0.72 mg/dL (ref 0.44–1.00)
Glucose, Bld: 191 mg/dL — ABNORMAL HIGH (ref 65–99)
POTASSIUM: 4.2 mmol/L (ref 3.5–5.1)
Sodium: 137 mmol/L (ref 135–145)
TOTAL PROTEIN: 6.8 g/dL (ref 6.5–8.1)

## 2015-07-07 LAB — HEPARIN LEVEL (UNFRACTIONATED)
HEPARIN UNFRACTIONATED: 0.27 [IU]/mL — AB (ref 0.30–0.70)
Heparin Unfractionated: 0.36 IU/mL (ref 0.30–0.70)

## 2015-07-07 LAB — CBC
HCT: 37.8 % (ref 36.0–46.0)
Hemoglobin: 12.2 g/dL (ref 12.0–15.0)
MCH: 30.5 pg (ref 26.0–34.0)
MCHC: 32.3 g/dL (ref 30.0–36.0)
MCV: 94.5 fL (ref 78.0–100.0)
PLATELETS: 173 10*3/uL (ref 150–400)
RBC: 4 MIL/uL (ref 3.87–5.11)
RDW: 13.4 % (ref 11.5–15.5)
WBC: 5.2 10*3/uL (ref 4.0–10.5)

## 2015-07-07 LAB — GLUCOSE, CAPILLARY
GLUCOSE-CAPILLARY: 212 mg/dL — AB (ref 65–99)
GLUCOSE-CAPILLARY: 176 mg/dL — AB (ref 65–99)
GLUCOSE-CAPILLARY: 180 mg/dL — AB (ref 65–99)
Glucose-Capillary: 216 mg/dL — ABNORMAL HIGH (ref 65–99)
Glucose-Capillary: 172 mg/dL — ABNORMAL HIGH (ref 65–99)

## 2015-07-07 LAB — LIPID PANEL
Cholesterol: 158 mg/dL (ref 0–200)
HDL: 43 mg/dL (ref >40)
LDL CALC: 82 mg/dL (ref 0–99)
TRIGLYCERIDES: 167 mg/dL — AB (ref <150)
Total CHOL/HDL Ratio: 3.7 RATIO
VLDL: 33 mg/dL (ref 0–40)

## 2015-07-07 LAB — TROPONIN I: Troponin I: 1.28 ng/mL (ref <0.031)

## 2015-07-07 LAB — TSH: TSH: 2.915 u[IU]/mL (ref 0.350–4.500)

## 2015-07-07 MED ORDER — SODIUM CHLORIDE 0.9 % IV BOLUS (SEPSIS)
500.0000 mL | Freq: Once | INTRAVENOUS | Status: AC
  Administered 2015-07-07: 500 mL via INTRAVENOUS

## 2015-07-07 NOTE — Evaluation (Signed)
Physical Therapy Evaluation Patient Details Name: Leah Small MRN: [id number] DOB: [birth year] Today's Date: 07/07/2015   History of Present Illness  Pt is a 74 yo female with baseline left-sided weakness which appeared to progress in upper and lower extremity today at around 1330. Additionally patient noted to have some slurred speech at that time. These deficits were noted by patient's son. Symptoms are constant but appear to be slowly improving per the son. Patient also some baseline confusion which seems to be unchanged.   Clinical Impression  Pt with admitted with above and demo's +SOB with activity, decreased activity tolerance, L sided weakness and impaired motor planning, increased falls risk, and generalized deconditioning. Pt was independent PTA but now requires minA for all mobility and ADLs. Pt to benefit from ST-SNF to achieve safe mod I level of function for safe transition home.    Follow Up Recommendations SNF;Supervision/Assistance - 24 hour    Equipment Recommendations  None recommended by PT    Recommendations for Other Services       Precautions / Restrictions Precautions Precautions: Fall Restrictions Weight Bearing Restrictions: No      Mobility  Bed Mobility Overal bed mobility: Needs Assistance Bed Mobility: Supine to Sit     Supine to sit: Min assist     General bed mobility comments: HOB elevated, definite use of bed rail  Transfers Overall transfer level: Needs assistance Equipment used: Rolling walker (2 wheeled);1 person hand held assist Transfers: Sit to/from Stand Sit to Stand: Min assist         General transfer comment: pt with shakiness/tremors during standing, minA required to assist pt with maintaining balance, pt with mild retropulsion  Ambulation/Gait Ambulation/Gait assistance: Min assist Ambulation Distance (Feet): 30 Feet Assistive device: Rolling walker (2 wheeled) Gait Pattern/deviations: Step-through pattern;Decreased  stride length;Shuffle;Wide base of support Gait velocity: slow Gait velocity interpretation: <1.8 ft/sec, indicative of risk for recurrent falls General Gait Details: pt with + SOB requiring standing rest stop after 15 feet, pt unsteady requiring minA  Stairs            Wheelchair Mobility    Modified Rankin (Stroke Patients Only) Modified Rankin (Stroke Patients Only) Pre-Morbid Rankin Score: Slight disability Modified Rankin: Moderate disability     Balance Overall balance assessment: Needs assistance Sitting-balance support: Feet supported;No upper extremity supported Sitting balance-Leahy Scale: Fair     Standing balance support: Bilateral upper extremity supported Standing balance-Leahy Scale: Poor Standing balance comment: requires UE assist                             Pertinent Vitals/Pain Pain Assessment: 0-10 Pain Score: 5  Pain Location: L LE Pain Descriptors / Indicators: Tightness    Home Living Family/patient expects to be discharged to:: Private residence Living Arrangements:  (son and dtr in law live with her but she's alone during day) Available Help at Discharge: Family;Available PRN/intermittently Type of Home: House Home Access: Ramped entrance     Home Layout: One level Home Equipment: Walker - 2 wheels;Cane - single point;Shower seat;Grab bars - tub/shower      Prior Function Level of Independence: Independent         Comments: progressive decline in activity tolerance, was driving, performing adls, pt used to be able to be gone all day but recently can only be out for like 2 hours     Hand Dominance   Dominant Hand: Right    Extremity/Trunk  Assessment   Upper Extremity Assessment: LUE deficits/detail       LUE Deficits / Details: tricep 3-/5, shld flex 3-/5, bicep 4/5, grip 3+/5   Lower Extremity Assessment: LLE deficits/detail   LLE Deficits / Details: grossly 3+/5  Cervical / Trunk Assessment: Normal   Communication   Communication: Expressive difficulties (delayed response time)  Cognition Arousal/Alertness: Awake/alert Behavior During Therapy: WFL for tasks assessed/performed Overall Cognitive Status: Impaired/Different from baseline Area of Impairment: Problem solving             Problem Solving: Slow processing;Difficulty sequencing      General Comments      Exercises        Assessment/Plan    PT Assessment Patient needs continued PT services  PT Diagnosis Difficulty walking;Generalized weakness   PT Problem List Decreased strength;Decreased activity tolerance;Decreased balance;Decreased mobility  PT Treatment Interventions DME instruction;Gait training;Functional mobility training;Therapeutic activities;Therapeutic exercise;Balance training   PT Goals (Current goals can be found in the Care Plan section) Acute Rehab PT Goals Patient Stated Goal: didn't state PT Goal Formulation: With patient/family Time For Goal Achievement: 07/14/15 Potential to Achieve Goals: Good    Frequency Min 4X/week   Barriers to discharge Decreased caregiver support alone during the day    Co-evaluation               End of Session Equipment Utilized During Treatment: Gait belt Activity Tolerance: Patient tolerated treatment well Patient left: in chair;with call bell/phone within reach;with family/visitor present Nurse Communication: Mobility status         Time: [id number] PT Time Calculation (min) (ACUTE ONLY): 32 min   Charges:   PT Evaluation $Initial PT Evaluation Tier I: 1 Procedure PT Treatments $Gait Training: 8-22 mins   PT G CodesMarcene Brawn:        Treyveon Mochizuki Marie 07/07/2015, 9:01 AM   Lewis ShockAshly Modell Fendrick, PT, DPT Pager #: [phone] Office #: [phone]

## 2015-07-07 NOTE — Progress Notes (Signed)
VASCULAR LAB PRELIMINARY  PRELIMINARY  PRELIMINARY  PRELIMINARY  Carotid duplex completed.    Preliminary report:  1-39% ICA stenosis.  Bilateral CEAs appear patent.  Vertebral artery flow is antegrade.   Leah Small, RVT 07/07/2015, 11:47 AM

## 2015-07-07 NOTE — Progress Notes (Signed)
ANTICOAGULATION CONSULT NOTE - Follow Up Consult  Pharmacy Consult for heparin Indication: NSTEMI  Allergies  Allergen Reactions  . Vicodin [Hydrocodone-Acetaminophen] Nausea And Vomiting    Patient Measurements: Height: 5\' 3"  (160 cm) Weight: 147 lb 4.3 oz (66.8 kg) IBW/kg (Calculated) : 52.4 Heparin Dosing Weight: 65 kg  Vital Signs: Temp: 97.5 F (36.4 C) (11/13 1110) Temp Source: Oral (11/13 1110) BP: 91/52 mmHg (11/13 1110) Pulse Rate: 67 (11/13 1110)  Labs:  Recent Labs  07/06/15 1510 07/06/15 1531 07/06/15 2038 07/07/15 0149 07/07/15 0755 07/07/15 0920 07/07/15 1534  HGB 12.4 14.6  --   --  12.2  --   --   HCT 37.9 43.0  --   --  37.8  --   --   PLT 196  --   --   --  173  --   --   APTT 29  --   --   --   --   --   --   LABPROT 14.0  --   --   --   --   --   --   INR 1.06  --   --   --   --   --   --   HEPARINUNFRC  --   --   --   --  0.36  --  0.27*  CREATININE 0.79 0.70  --   --   --  0.72  --   TROPONINI  --   --  0.89* 1.28*  --   --   --     Estimated Creatinine Clearance: 55.8 mL/min (by C-G formula based on Cr of 0.72).   Medications:  Infusions:  . heparin 800 Units/hr (07/07/15 0121)    Assessment: 74 y/o female admitted with possible TIA and found to have NSTEMI. She continues on IV heparin.  Heparin level is subtherapeutic at 0.27 on 800 units/hr. No bleeding noted. No problems with infusion or bleeding per RN.  Goal of Therapy:  Heparin level 0.3-0.5 units/ml Monitor platelets by anticoagulation protocol: Yes   Plan:  - Increase heparin drip to 900 units/hr - 6 hr heparin level - Daily heparin level and CBC - Monitor for s/sx of bleeding  Central Arizona EndoscopyJennifer Loyall, SavoyPharm.D., BCPS Clinical Pharmacist Pager: [phone] 07/07/2015 5:39 PM

## 2015-07-07 NOTE — Progress Notes (Signed)
ANTICOAGULATION CONSULT NOTE - Initial Consult  Pharmacy Consult for Heparin Indication: chest pain/ACS  Allergies  Allergen Reactions  . Vicodin [Hydrocodone-Acetaminophen] Nausea And Vomiting    Patient Measurements: Height: 5\' 3"  (160 cm) Weight: 147 lb 4.3 oz (66.8 kg) IBW/kg (Calculated) : 52.4 Heparin Dosing Weight: 65 kg  Vital Signs: Temp: 97.8 F (36.6 C) (11/13 0700) Temp Source: Oral (11/13 0700) BP: 121/41 mmHg (11/13 0700) Pulse Rate: 62 (11/13 0700)  Labs:  Recent Labs  07/06/15 1510 07/06/15 1531 07/06/15 2038 07/07/15 0149 07/07/15 0755  HGB 12.4 14.6  --   --  12.2  HCT 37.9 43.0  --   --  37.8  PLT 196  --   --   --  173  APTT 29  --   --   --   --   LABPROT 14.0  --   --   --   --   INR 1.06  --   --   --   --   HEPARINUNFRC  --   --   --   --  0.36  CREATININE 0.79 0.70  --   --   --   TROPONINI  --   --  0.89* 1.28*  --     Estimated Creatinine Clearance: 55.8 mL/min (by C-G formula based on Cr of 0.7).   Medical History: Past Medical History  Diagnosis Date  . Coronary artery disease   . Stroke (HCC)   . Anginal pain (HCC)   . Psoriasis   . Diabetes mellitus without complication (HCC)     Medications:  Prescriptions prior to admission  Medication Sig Dispense Refill Last Dose  . acetaminophen (TYLENOL ARTHRITIS PAIN) 650 MG CR tablet Take 650-1,300 mg by mouth every 8 (eight) hours as needed for pain.   few days ago  . amLODipine (NORVASC) 2.5 MG tablet Take 2.5 mg by mouth at bedtime.   07/05/2015 at Unknown time  . aspirin EC 81 MG tablet Take 81 mg by mouth at bedtime.   07/05/2015 at Unknown time  . atorvastatin (LIPITOR) 20 MG tablet Take 20 mg by mouth at bedtime.   07/05/2015 at Unknown time  . bimatoprost (LUMIGAN) 0.01 % SOLN Place 1 drop into both eyes at bedtime.   07/05/2015 at Unknown time  . brimonidine (ALPHAGAN) 0.2 % ophthalmic solution Place 1 drop into both eyes 2 (two) times daily.    07/06/2015 at am  .  CALCIUM PO Take 1 tablet by mouth daily.   07/06/2015 at Unknown time  . clopidogrel (PLAVIX) 75 MG tablet Take 75 mg by mouth daily.   07/06/2015 at Unknown time  . glipiZIDE (GLUCOTROL XL) 10 MG 24 hr tablet Take 10 mg by mouth daily with breakfast.   07/06/2015 at Unknown time  . Insulin Glargine (LANTUS SOLOSTAR) 100 UNIT/ML Solostar Pen Inject 24 Units into the skin at bedtime.   07/05/2015 at Unknown time  . Insulin Lispro Prot & Lispro (HUMALOG MIX 75/25 KWIKPEN) (75-25) 100 UNIT/ML Kwikpen Inject 6-12 Units into the skin 4 (four) times daily - after meals and at bedtime. Per sliding scale: 6 units plus:   CBG 150-200 add 1 unit, 201-250 add 2 units, 251-300 add 3 units, 301-350 add 4 units 351-400 add 5 units, >400 add 6 units and call MD   07/06/2015 at 1315  . isosorbide mononitrate (IMDUR) 30 MG 24 hr tablet Take 30 mg by mouth at bedtime.   07/05/2015 at Unknown time  . MAGNESIUM  PO Take 1 tablet by mouth daily.   07/06/2015 at Unknown time  . metFORMIN (GLUCOPHAGE) 1000 MG tablet Take 1,000 mg by mouth 2 (two) times daily with a meal.   07/06/2015 at breakfast  . metoprolol tartrate (LOPRESSOR) 25 MG tablet Take 12.5 mg by mouth at bedtime.   07/05/2015 at 1930  . Multiple Vitamins-Minerals (ZINC PO) Take 1 tablet by mouth daily.   07/06/2015 at Unknown time  . nitroGLYCERIN (NITRODUR - DOSED IN MG/24 HR) 0.4 mg/hr patch Place 0.4 mg onto the skin daily.   07/06/2015 at 830  . nitroGLYCERIN (NITROSTAT) 0.4 MG SL tablet Place 0.4 mg under the tongue every 5 (five) minutes as needed for chest pain.   07/06/2015 at 1330  . nystatin cream (MYCOSTATIN) Apply 1 application topically 2 (two) times daily as needed (yeast infection).    maybe 11/11  . OVER THE COUNTER MEDICATION Take 1 tablet by mouth daily as needed (sinus congestion). Coricidin hbp   few days ago  . ranitidine (ZANTAC) 75 MG tablet Take 75 mg by mouth daily as needed for heartburn (acid reflux).   unknown  . timolol (BETIMOL)  0.5 % ophthalmic solution Place 1 drop into both eyes 2 (two) times daily.   07/06/2015 at 630  . triamcinolone ointment (KENALOG) 0.1 % Apply 1 application topically 3 (three) times daily as needed (itching from psoriasis).   07/05/2015 at Unknown time  . vitamin B-12 (CYANOCOBALAMIN) 1000 MCG tablet Take 1,000 mcg by mouth daily.   07/06/2015 at Unknown time  . vitamin E 400 UNIT capsule Take 400 Units by mouth daily.   07/06/2015 at Unknown time    Assessment: 74 y.o. female admitted with CVA, now with elevated cardiac markers/NSTEMI, for heparin.  Last dose of Heparin 5000 units SQ given at 9 pm. Heparin at 800 units/hr with therapeutic level.  Goal of Therapy:  Heparin level 0.3-0.5 Monitor platelets by anticoagulation protocol: Yes   Plan:  Continue heparin 800 units/hr Check heparin level in 6 hours Daily HL, CBC   Sherron Monday, PharmD Clinical Pharmacy Resident Pager: [phone] 07/07/2015 9:38 AM

## 2015-07-07 NOTE — Progress Notes (Signed)
  Echocardiogram 2D Echocardiogram has been performed.  Leta JunglingCooper, Jaslynne Dahan M 07/07/2015, 2:29 PM

## 2015-07-07 NOTE — Progress Notes (Addendum)
SUBJECTIVE:  No further CP  OBJECTIVE:   Vitals:   Filed Vitals:   07/07/15 0400 07/07/15 0500 07/07/15 0600 07/07/15 0700  BP: 103/44 110/44 121/51 121/41  Pulse: 56 57 61 62  Temp:    97.8 F (36.6 C)  TempSrc:    Oral  Resp: 16 13 16 20   Height:      Weight:      SpO2: 96% 96% 96% 97%   I&O's:   Intake/Output Summary (Last 24 hours) at 07/07/15 1024 Last data filed at 07/07/15 0746  Gross per 24 hour  Intake 856.45 ml  Output   1700 ml  Net -843.55 ml   TELEMETRY: Reviewed telemetry pt in NSR:     PHYSICAL EXAM General: Well developed, well nourished, in no acute distress Head: Eyes PERRLA, No xanthomas.   Normal cephalic and atramatic  Lungs:   Clear bilaterally to auscultation and percussion. Heart:   HRRR S1 S2 Pulses are 2+ & equal. Abdomen: Bowel sounds are positive, abdomen soft and non-tender without masses  Extremities:   No clubbing, cyanosis or edema.  DP +1 Neuro: Alert and oriented X 3. Psych:  Good affect, responds appropriately   LABS: Basic Metabolic Panel:  Recent Labs  40/98/1110/08/08 1510 07/06/15 1531 07/07/15 0920  NA 137 139 137  K 4.3 4.3 4.2  CL 100* 99* 103  CO2 24  --  26  GLUCOSE 286* 284* 191*  BUN 12 14 10   CREATININE 0.79 0.70 0.72  CALCIUM 10.0  --  9.2   Liver Function Tests:  Recent Labs  07/06/15 1510 07/07/15 0920  AST 34 25  ALT 23 18  ALKPHOS 57 52  BILITOT 0.5 0.7  PROT 7.9 6.8  ALBUMIN 4.3 3.7   No results for input(s): LIPASE, AMYLASE in the last 72 hours. CBC:  Recent Labs  07/06/15 1510 07/06/15 1531 07/07/15 0755  WBC 5.5  --  5.2  NEUTROABS 3.0  --   --   HGB 12.4 14.6 12.2  HCT 37.9 43.0 37.8  MCV 92.9  --  94.5  PLT 196  --  173   Cardiac Enzymes:  Recent Labs  07/06/15 2038 07/07/15 0149  TROPONINI 0.89* 1.28*   BNP: Invalid input(s): POCBNP D-Dimer: No results for input(s): DDIMER in the last 72 hours. Hemoglobin A1C: No results for input(s): HGBA1C in the last 72  hours. Fasting Lipid Panel:  Recent Labs  07/07/15 0149  CHOL 158  HDL 43  LDLCALC 82  TRIG 167*  CHOLHDL 3.7   Thyroid Function Tests: No results for input(s): TSH, T4TOTAL, T3FREE, THYROIDAB in the last 72 hours.  Invalid input(s): FREET3 Anemia Panel: No results for input(s): VITAMINB12, FOLATE, FERRITIN, TIBC, IRON, RETICCTPCT in the last 72 hours. Coag Panel:   Lab Results  Component Value Date   INR 1.06 07/06/2015   INR 0.9 09/16/2011    RADIOLOGY: Ct Head Wo Contrast  07/06/2015  CLINICAL DATA:  CODE STROKE, DR. Leroy KennedyAMILO [phone], LEFT SIDE WEAKNESS, last seen normal at 1400, HX CVA. EXAM: CT HEAD WITHOUT CONTRAST TECHNIQUE: Contiguous axial images were obtained from the base of the skull through the vertex without intravenous contrast. COMPARISON:  MRI 02/06/2011, CT HEAD 08/09/2010 FINDINGS: There is central and cortical atrophy. There is no intra or extra-axial fluid collection or mass lesion. The basilar cisterns and ventricles have a normal appearance. There is no CT evidence for acute infarction or hemorrhage. Bone windows show mild mucoperiosteal thickening of the ethmoid air  cells. No acute calvarial injury. Mastoid air cells are normally aerated. IMPRESSION: 1. Atrophy. 2.  No evidence for acute intracranial abnormality. 3. Mild chronic sinusitis. Critical Value/emergent results were called by telephone at the time of interpretation on 07/06/2015 at 3:41 pm to Dr. Leroy Kennedy, who verbally acknowledged these results. Electronically Signed   By: Norva Pavlov M.D.   On: 07/06/2015 15:41   Mr [name] Wo Contrast  07/06/2015  CLINICAL DATA:  74 y.o. female brought in with acute onset left face weakness and word finding difficulty. Suspect right subcortical infarct. NIHSS 8, but her deficits are old except for speech impediment and mild left face weakness, and thus did not treat her with thrombolytic therapy or endovascular intervention.Admit to medicine and complete  stroke work up. She is already on dual antiplatelet therapy and will continue this pending results stroke work up. EXAM: MRI HEAD WITHOUT CONTRAST MRA HEAD WITHOUT CONTRAST TECHNIQUE: Multiplanar, multiecho pulse sequences of the brain and surrounding structures were obtained without intravenous contrast. Angiographic images of the head were obtained using MRA technique without contrast. COMPARISON:  CT head earlier today FINDINGS: MRI HEAD FINDINGS No evidence for acute infarction, hemorrhage, mass lesion, hydrocephalus, or extra-axial fluid. Generalized atrophy. Chronic microvascular ischemic change. No midline abnormality. Flow voids are maintained. Extracranial soft tissues unremarkable. Mild chronic sinus disease. BILATERAL cataract extraction. Similar appearance to earlier CT. MRA HEAD FINDINGS Dolichoectatic but widely patent internal carotid arteries. Widely patent basilar artery with vertebrals codominant. No A1 or M1 stenosis of significance. Focal narrowing proximal LEFT P1 PCA segment 50-75% stenosis. Uncertain significance given the lack of acute or chronic LEFT occipital infarction. No cerebellar branch occlusion. No visible saccular aneurysm. IMPRESSION: Atrophy and small vessel disease.  No acute intracranial findings. 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 Electronically Signed   By: Elsie Stain M.D.   On: 07/06/2015 18:42   Mr Brain Wo Contrast  07/06/2015  CLINICAL DATA:  74 y.o. female brought in with acute onset left face weakness and word finding difficulty. Suspect right subcortical infarct. NIHSS 8, but her deficits are old except for speech impediment and mild left face weakness, and thus did not treat her with thrombolytic therapy or endovascular intervention.Admit to medicine and complete stroke work up. She is already on dual antiplatelet therapy and will continue this pending results stroke work up. EXAM: MRI HEAD WITHOUT CONTRAST MRA HEAD WITHOUT  CONTRAST TECHNIQUE: Multiplanar, multiecho pulse sequences of the brain and surrounding structures were obtained without intravenous contrast. Angiographic images of the head were obtained using MRA technique without contrast. COMPARISON:  CT head earlier today FINDINGS: MRI HEAD FINDINGS No evidence for acute infarction, hemorrhage, mass lesion, hydrocephalus, or extra-axial fluid. Generalized atrophy. Chronic microvascular ischemic change. No midline abnormality. Flow voids are maintained. Extracranial soft tissues unremarkable. Mild chronic sinus disease. BILATERAL cataract extraction. Similar appearance to earlier CT. MRA HEAD FINDINGS Dolichoectatic but widely patent internal carotid arteries. Widely patent basilar artery with vertebrals codominant. No A1 or M1 stenosis of significance. Focal narrowing proximal LEFT P1 PCA segment 50-75% stenosis. Uncertain significance given the lack of acute or chronic LEFT occipital infarction. No cerebellar branch occlusion. No visible saccular aneurysm. IMPRESSION: Atrophy and small vessel disease.  No acute intracranial findings. 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 Electronically Signed   By: Elsie Stain M.D.   On: 07/06/2015 18:42   ASSESSMENT:  1. NSTEMI in the setting of severe CAD- last cath showed occluded  grafts but patent LIMA to LAD and patient was deemed not to be a redo CABG or PCI candidate.   - stable hemodynamically - no acute HF or unstable arrhythmia - continue ASA/Plavix/statin - BB and long acting nitrate held due to low BP - CP free at this time  - continue IV Heparin gtt - Check 2D echo to assess LVF - continue to cycle enzymes until they peak - need to obtain records from Dr. Philemon Kingdom office (any recent echo, cath and myocardial perfusion scans) before further workup pursued.  She says that she was on Ranexa at one point but could not afford it and so she was placed on long acting nitrates as well as  NTG patch 2.  HTN - BP meds on hold due to soft BP 3.  DM 4.  Dyslipidemia - continue statin 5.  PVD s/p bilateral CEA 6.  Left facial weakness with language impairment - ? TIA - Neuro w/u in progress     Quintella Reichert, MD  07/07/2015  10:24 AM

## 2015-07-07 NOTE — Progress Notes (Addendum)
STROKE TEAM PROGRESS NOTE   HISTORY Leah Small is an 74 y.o. 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  At baseline patient has left arm and leg weakness/numbness residual from prior stroke but reportedly is high functioning.  She denies associated HA, vertigo, double vision, difficulty swallowing, confusion, or vision impairment. Complains of mid sternal chest pain similar to her episodic angina. NIHSS 8. CT brain was personally reviewed and showed no acute abnormality. Stated that she takes aspirin + plavix daily. Date last known well:  Time last known well:  tPA Given: no, only new finding is mild face weakness and dysarthria that comes and goes, remaining deficits are allegedly old. NIHSS: 8 MRS: 0    Family at bedside today (07/07/2015) and patient along with family better able to provide accurate HPI:    The day of admission, she was at a church event and "felt a weak spell coming on."  She left the church and attempted to drive home.  Within a 1/4 mile she realized she needed to pull over because the feeling of confusion was worsening.  She pulled into a [employer] parking lot and checked her blood sugar.  The blood sugar was apparently in the high 200's.  She gave herself insulin and called her son.  She noted chest pain , which she gets often and she took a nitro tab.  She feels like she was "out of it and disconnected to what was going on the parking lot."  She states she lost track of time.  The son at the bedside apparently arrived within 10 minutes.  He assisted her out of the car and she walked to the passenger side of his car.  She was "in a daze."  Upon arrival home, they rechecked her blood sugar which was in the 300's despite the insulin taken.  They checked the blood pressure which was reportedly  normal.  She continued to have chest pain and she took an additional nitro.  At home with her son, he noted the patient to have left face droopiness and word finding difficulty.   This did not occur until after the second nitro.  EMS was called and such findings were  confirmed.  We discussed the fact that patient has had spells of "Zoning out" for approx. 20 years.  She remembers having a EEG back then but does not recall the results.  Patient has had these spells once every week to 3 weeks until recently.  She now has the spells several times a week.  The spells are described as confusion or staring.  In addition, her daughter and son stated that there are times she will force the shoulders back and draw the left side up.  Patient also reports that her memory is not as good.  It is unclear if this is related to her spells or not.  She offereed examples of forgetting her checkbook at [employer] but leaving them at [employer], getting lost when driving in familiar areas or arriving at her final destination but unsure how she arrived.   SUBJECTIVE (INTERVAL HISTORY) Her family is at the bedside.  Overall she feels her condition is improved today.  No events overnight   OBJECTIVE Temp:  [97.7 F (36.5 C)-97.9 F (36.6 C)] 97.8 F (36.6 C) (  11/13 0700) Pulse Rate:  [56-98] 62 (11/13 0700) Cardiac Rhythm:  [-] Normal sinus rhythm (11/13 0720) Resp:  [13-24] 20 (11/13 0700) BP: (85-177)/(41-86) 121/41 mmHg (11/13 0700) SpO2:  [95 %-100 %] 97 % (11/13 0700) Weight:  [66.8 kg (147 lb 4.3 oz)-71 kg (156 lb 8.4 oz)] 66.8 kg (147 lb 4.3 oz) (11/12 1836)  CBC:  Recent Labs Lab 07/06/15 1510 07/06/15 1531  WBC 5.5  --   NEUTROABS 3.0  --   HGB 12.4 14.6  HCT 37.9 43.0  MCV 92.9  --   PLT 196  --     Basic Metabolic Panel:  Recent Labs Lab 07/06/15 1510 07/06/15 1531  NA 137 139  K 4.3 4.3  CL 100* 99*  CO2 24  --   GLUCOSE 286* 284*  BUN 12 14  CREATININE 0.79  0.70  CALCIUM 10.0  --     Lipid Panel:    Component Value Date/Time   CHOL 158 07/07/2015 0149   TRIG 167* 07/07/2015 0149   HDL 43 07/07/2015 0149   CHOLHDL 3.7 07/07/2015 0149   VLDL 33 07/07/2015 0149   LDLCALC 82 07/07/2015 0149   HgbA1c: No results found for: HGBA1C Urine Drug Screen:    Component Value Date/Time   LABOPIA NONE DETECTED 07/06/2015 1558   COCAINSCRNUR NONE DETECTED 07/06/2015 1558   LABBENZ NONE DETECTED 07/06/2015 1558   AMPHETMU NONE DETECTED 07/06/2015 1558   THCU NONE DETECTED 07/06/2015 1558   LABBARB NONE DETECTED 07/06/2015 1558      IMAGING  Ct Head Wo Contrast 07/06/2015   1. Atrophy.  2. No evidence for acute intracranial abnormality.  3. Mild chronic sinusitis.    Mr [name] Wo Contrast 07/06/2015   MRI HEAD FINDINGS  No evidence for acute infarction, hemorrhage, mass lesion, hydrocephalus, or extra-axial fluid. Generalized atrophy. Chronic microvascular ischemic change. No midline abnormality. Flow voids are maintained. Extracranial soft tissues unremarkable. Mild chronic sinus disease. BILATERAL cataract extraction. Similar appearance to earlier CT.  MRA HEAD FINDINGS  Dolichoectatic but widely patent internal carotid arteries. Widely patent basilar artery with vertebrals codominant. No A1 or M1 stenosis of significance. Focal narrowing proximal LEFT P1 PCA segment 50-75% stenosis. Uncertain significance given the lack of acute or chronic LEFT occipital infarction. No cerebellar branch occlusion. No visible saccular aneurysm. IMPRESSION:  Atrophy and small vessel disease.  No acute intracranial findings. 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    PHYSICAL EXAM Physical Exam General - Well nourished, well developed, in NAD   Cardiovascular - Regular rate and rhythm Pulmonary: CTA Abdomen: NT, ND, normal bowel sounds Extremities: No C/C/E  Neurological Exam Mental Status: Normal Orientation:   Oriented to person, place and time Speech:  Fluent; no dysarthria  Cranial Nerves:  PERRL; EOMI; visual fields full, face grossly symmetric, hearing grossly intact; shrug symmetric and tongue midline  Motor Exam:  Tone:  Within normal limits; Strength: 5/5 on the right; 4/5 on the left (chronic)  Sensory: Intact to light touch throughout the right side but decreased on the left (chronic)  Coordination:  Intact finger to nose  Gait: Deferred  ASSESSMENT/PLAN [name] is a 74 y.o. female with history of coronary artery disease, S/P CABG, previous stroke with residual left hemiparesis, psoriasis, bilateral carotid endarterectomies, and diabetes mellitus, presenting with left facial weakness and language impairment. She did not receive IV t-PA due to minimal new deficits.   Possible TIA:  Non-dominant secondary to small  vessel disease.  Would also condiser partial complex seizure as a possibility  Resultant  Left sided weakness  MRI - No acute intracranial findings.  MRA - 50-75% LEFT P1 stenosis  Carotid Doppler - pending  2D Echo - pending  LDL - 82  HgbA1c pending  VTE prophylaxis - subcutaneous heparin  Diet Carb Modified Fluid consistency:: Thin; Room service appropriate?: Yes  aspirin 81 mg daily and clopidogrel 75 mg daily prior to admission, now on aspirin 81 mg daily and clopidogrel 75 mg daily  Patient counseled to be compliant with her antithrombotic medications  Ongoing aggressive stroke risk factor management  Therapy recommendations: Pending  Disposition: Pending  Hypertension  Mildly low at times - currently not on scheduled antihypertensive medications.  Permissive hypertension (OK if < 220/120) but gradually normalize in 5-7 days  Hyperlipidemia  Home meds:  Lipitor 20 mg daily resumed in hospital  LDL 82, goal < 70  Lipitor increased to 40 mg daily  Continue statin at discharge  Diabetes  HgbA1c pending, goal <  7.0  Uncontrolled  Other Stroke Risk Factors  Advanced age  Hx stroke/TIA  Family hx stroke (Mother)  Coronary artery disease   Other Active Problems  Patient admitted today that her ophthalmologist just told her she has significantly increased pressures in both eyes  Memory challenges.  Will send reversible causes of dementia labs  Confusion spells:  Will order cEEG and sleep deprive patient this evening to rule out complex partial seizure.  Also, of note, patient is a significant vasculopath with a reported history of CEA's in the past as well as known severe coronary artery disease.  Spells may be perfusion relaterd  Hospital day # 1  Delton Seeavid Rinehuls PA-C [facility] Pager [phone] 07/07/2015, 9:11 AM  CRITICAL CARE NEUROLOGY ATTENDING NOTE Patient was seen and examined by me personally. I reviewed notes, independently viewed imaging studies, participated in medical decision making and plan of care. I have made additions or clarifications directly to the above note.  Documentation accurately reflects findings. The laboratory and radiographic studies were personally reviewed by me.  ROS completed by me personally and pertinent positives fully documented.  Assessment and plan completed by me personally and fully documented above.  Condition is improved    I spent 60 minutes of consultative care time in the history, radiology and laboratory review, discussion with family and assessment and plan.  SIGNED BY: Dr. Sula Sodahere Jennilee Demarco     To contact Stroke Continuity provider, please refer to [email or web address]. After hours, contact General Neurology

## 2015-07-07 NOTE — Progress Notes (Signed)
[facility] PROGRESS NOTE  Leah FishmanDonnie R Small NWG:[id number] DOB: November 24, 1940 DOA: 07/06/2015 PCP: Marguarite ArbourSPARKS,JEFFREY D, MD  Length of stay: 1   Assessment/Plan: Active Problems:   Stroke (HCC)   Diabetes mellitus with complication (HCC)   Essential hypertension   HLD (hyperlipidemia)   Chest pain   Memory deficit   Confusional state   Brief summary 74 y.o. 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 At baseline patient has left arm and leg weakness/numbness residual from prior stroke but reportedly is high functioning. She denies associated HA, vertigo, double vision, difficulty swallowing, confusion, or vision impairment. Complains of mid sternal chest pain similar to her episodic angina. NIHSS 8. CT brain was personally reviewed and showed no acute abnormality. Stated that she takes aspirin + plavix daily.   Assessment and plan  Possible TIA: Non-dominant secondary to small vessel disease. Would also condiser partial complex seizure as a possibility. Resultant Left sided weakness. MRI - No acute intracranial findings. MRA - 50-75% LEFT P1 stenosis  Carotid Doppler1-39% ICA stenosis. Bilateral CEAs appear patent  2D Echo - pending  LDL - 82  HgbA1c pending  VTE prophylaxis - subcutaneous heparin  Diet regular diet and thin liquids, continue   aspirin 81 mg daily and clopidogrel 75 mg daily per neurology  PT OT recommendations Pending   Confusion spells EEG ordered by neurology to rule out complex partial seizures    Hypertension . Hypotensive this morning in the 90s, - currently not on scheduled antihypertensive medications.Permissive hypertension (OK if < 220/120) but gradually normalize in 5-7 days  Hyperlipidemia  Increase Lipitor from 20-40 mg a day,LDL 82, goal <  70    Diabetes  HgbA1c pending, goal < 7.0 Uncontrolled. Continue SSI   NSTEMI in the setting of severe CAD- last cath showed occluded grafts but patent LIMA to LAD and patient was deemed not to be a redo CABG or PCI candidate. - continue ASA/Plavix/statin - BB and long acting nitrate held due to low BP.- CP free at this time .- continue IV Heparin gtt, follow 2 D echo to assess LVF - continue to cycle enzymes until they peak   DVT prophylaxsis  heparin drip   Code Status:      Code Status Orders        Start     Ordered   07/06/15 1757  Full code   Continuous     07/06/15 1804    Advance Directive Documentation        Most Recent Value   Type of Advance Directive  Healthcare Power of Attorney, Living will   Pre-existing out of facility DNR order (yellow form or pink MOST form)     "MOST" Form in Place?       Family Communication: family updated about patient's clinical progress Disposition Plan:  As above      Consultants:  Neurology  Cardiology  Procedures:  None  Antibiotics: Anti-infectives    None         HPI/Subjective: No chest pain this morning  Objective: Filed Vitals:   07/07/15 0700 07/07/15 1000 07/07/15 1100 07/07/15 1110  BP: 121/41 94/55  91/52  Pulse: 62 64  67  Temp: 97.8 F (36.6 C)  97.7 F (36.5 C)   TempSrc: Oral  Oral  Resp: 20 21  22   Height:      Weight:      SpO2: 97% 100%  97%    Intake/Output Summary (Last 24 hours) at 07/07/15 1243 Last data filed at 07/07/15 1100  Gross per 24 hour  Intake 1263.45 ml  Output   2250 ml  Net -986.55 ml    Exam: General: Well developed, well nourished, in no acute distress Head: Eyes PERRLA, No xanthomas. Normal cephalic and atramatic Lungs: Clear bilaterally to auscultation and percussion. Heart: HRRR S1 S2 Pulses are 2+ & equal. Abdomen: Bowel sounds are positive, abdomen soft and non-tender without masses  Extremities: No clubbing, cyanosis or edema.  DP +1 Neuro: Alert and oriented X 3. Psych: Good affect, responds appropriately   Data Review   Micro Results Recent Results (from the past 240 hour(s))  MRSA PCR Screening     Status: None   Collection Time: 07/06/15  6:30 PM  Result Value Ref Range Status   MRSA by PCR NEGATIVE NEGATIVE Final    Comment:        The GeneXpert MRSA Assay (FDA approved for NASAL specimens only), is one component of a comprehensive MRSA colonization surveillance program. It is not intended to diagnose MRSA infection nor to guide or monitor treatment for MRSA infections.     Radiology Reports Ct Head Wo Contrast  07/06/2015  CLINICAL DATA:  CODE STROKE, DR. Leroy Kennedy [phone], LEFT SIDE WEAKNESS, last seen normal at 1400, HX CVA. EXAM: CT HEAD WITHOUT CONTRAST TECHNIQUE: Contiguous axial images were obtained from the base of the skull through the vertex without intravenous contrast. COMPARISON:  MRI 02/06/2011, CT HEAD 08/09/2010 FINDINGS: There is central and cortical atrophy. There is no intra or extra-axial fluid collection or mass lesion. The basilar cisterns and ventricles have a normal appearance. There is no CT evidence for acute infarction or hemorrhage. Bone windows show mild mucoperiosteal thickening of the ethmoid air cells. No acute calvarial injury. Mastoid air cells are normally aerated. IMPRESSION: 1. Atrophy. 2.  No evidence for acute intracranial abnormality. 3. Mild chronic sinusitis. Critical Value/emergent results were called by telephone at the time of interpretation on 07/06/2015 at 3:41 pm to Dr. Leroy Kennedy, who verbally acknowledged these results. Electronically Signed   By: Norva Pavlov M.D.   On: 07/06/2015 15:41   Mr [name] Wo Contrast  07/06/2015  CLINICAL DATA:  74 y.o. female brought in with acute onset left face weakness and word finding difficulty. Suspect right subcortical infarct. NIHSS 8, but her deficits are old except for speech impediment and mild left face  weakness, and thus did not treat her with thrombolytic therapy or endovascular intervention.Admit to medicine and complete stroke work up. She is already on dual antiplatelet therapy and will continue this pending results stroke work up. EXAM: MRI HEAD WITHOUT CONTRAST MRA HEAD WITHOUT CONTRAST TECHNIQUE: Multiplanar, multiecho pulse sequences of the brain and surrounding structures were obtained without intravenous contrast. Angiographic images of the head were obtained using MRA technique without contrast. COMPARISON:  CT head earlier today FINDINGS: MRI HEAD FINDINGS No evidence for acute infarction, hemorrhage, mass lesion, hydrocephalus, or extra-axial fluid. Generalized atrophy. Chronic microvascular ischemic change. No midline abnormality. Flow voids are maintained. Extracranial soft tissues unremarkable. Mild chronic sinus disease. BILATERAL cataract extraction. Similar appearance to earlier CT. MRA HEAD FINDINGS Dolichoectatic but widely patent internal carotid arteries. Widely patent basilar artery with vertebrals codominant. No A1 or M1 stenosis of significance. Focal narrowing proximal LEFT P1 PCA segment  50-75% stenosis. Uncertain significance given the lack of acute or chronic LEFT occipital infarction. No cerebellar branch occlusion. No visible saccular aneurysm. IMPRESSION: Atrophy and small vessel disease.  No acute intracranial findings. 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 Electronically Signed   By: Elsie Stain M.D.   On: 07/06/2015 18:42   Mr Brain Wo Contrast  07/06/2015  CLINICAL DATA:  74 y.o. female brought in with acute onset left face weakness and word finding difficulty. Suspect right subcortical infarct. NIHSS 8, but her deficits are old except for speech impediment and mild left face weakness, and thus did not treat her with thrombolytic therapy or endovascular intervention.Admit to medicine and complete stroke work up. She is already on dual  antiplatelet therapy and will continue this pending results stroke work up. EXAM: MRI HEAD WITHOUT CONTRAST MRA HEAD WITHOUT CONTRAST TECHNIQUE: Multiplanar, multiecho pulse sequences of the brain and surrounding structures were obtained without intravenous contrast. Angiographic images of the head were obtained using MRA technique without contrast. COMPARISON:  CT head earlier today FINDINGS: MRI HEAD FINDINGS No evidence for acute infarction, hemorrhage, mass lesion, hydrocephalus, or extra-axial fluid. Generalized atrophy. Chronic microvascular ischemic change. No midline abnormality. Flow voids are maintained. Extracranial soft tissues unremarkable. Mild chronic sinus disease. BILATERAL cataract extraction. Similar appearance to earlier CT. MRA HEAD FINDINGS Dolichoectatic but widely patent internal carotid arteries. Widely patent basilar artery with vertebrals codominant. No A1 or M1 stenosis of significance. Focal narrowing proximal LEFT P1 PCA segment 50-75% stenosis. Uncertain significance given the lack of acute or chronic LEFT occipital infarction. No cerebellar branch occlusion. No visible saccular aneurysm. IMPRESSION: Atrophy and small vessel disease.  No acute intracranial findings. 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 Electronically Signed   By: Elsie Stain M.D.   On: 07/06/2015 18:42     CBC  Recent Labs Lab 07/06/15 1510 07/06/15 1531 07/07/15 0755  WBC 5.5  --  5.2  HGB 12.4 14.6 12.2  HCT 37.9 43.0 37.8  PLT 196  --  173  MCV 92.9  --  94.5  MCH 30.4  --  30.5  MCHC 32.7  --  32.3  RDW 13.2  --  13.4  LYMPHSABS 1.9  --   --   MONOABS 0.5  --   --   EOSABS 0.1  --   --   BASOSABS 0.0  --   --     Chemistries   Recent Labs Lab 07/06/15 1510 07/06/15 1531 07/07/15 0920  NA 137 139 137  K 4.3 4.3 4.2  CL 100* 99* 103  CO2 24  --  26  GLUCOSE 286* 284* 191*  BUN 12 14 10   CREATININE 0.79 0.70 0.72  CALCIUM 10.0  --  9.2  AST  34  --  25  ALT 23  --  18  ALKPHOS 57  --  52  BILITOT 0.5  --  0.7   ------------------------------------------------------------------------------------------------------------------ estimated creatinine clearance is 55.8 mL/min (by C-G formula based on Cr of 0.72). ------------------------------------------------------------------------------------------------------------------ No results for input(s): HGBA1C in the last 72 hours. ------------------------------------------------------------------------------------------------------------------  Recent Labs  07/07/15 0149  CHOL 158  HDL 43  LDLCALC 82  TRIG 167*  CHOLHDL 3.7   ------------------------------------------------------------------------------------------------------------------ No results for input(s): TSH, T4TOTAL, T3FREE, THYROIDAB in the last 72 hours.  Invalid input(s): FREET3 ------------------------------------------------------------------------------------------------------------------ No results for input(s): VITAMINB12, FOLATE, FERRITIN, TIBC, IRON, RETICCTPCT in the last 72 hours.  Coagulation profile  Recent Labs Lab  07/06/15 1510  INR 1.06    No results for input(s): DDIMER in the last 72 hours.  Cardiac Enzymes  Recent Labs Lab 07/06/15 2038 07/07/15 0149  TROPONINI 0.89* 1.28*   ------------------------------------------------------------------------------------------------------------------ Invalid input(s): POCBNP   CBG:  Recent Labs Lab 07/06/15 2211 07/07/15 0120 07/07/15 0743  GLUCAP 202* 212* 176*       Studies: Ct Head Wo Contrast  07/06/2015  CLINICAL DATA:  CODE STROKE, DR. Leroy Kennedy [phone], LEFT SIDE WEAKNESS, last seen normal at 1400, HX CVA. EXAM: CT HEAD WITHOUT CONTRAST TECHNIQUE: Contiguous axial images were obtained from the base of the skull through the vertex without intravenous contrast. COMPARISON:  MRI 02/06/2011, CT HEAD 08/09/2010 FINDINGS: There  is central and cortical atrophy. There is no intra or extra-axial fluid collection or mass lesion. The basilar cisterns and ventricles have a normal appearance. There is no CT evidence for acute infarction or hemorrhage. Bone windows show mild mucoperiosteal thickening of the ethmoid air cells. No acute calvarial injury. Mastoid air cells are normally aerated. IMPRESSION: 1. Atrophy. 2.  No evidence for acute intracranial abnormality. 3. Mild chronic sinusitis. Critical Value/emergent results were called by telephone at the time of interpretation on 07/06/2015 at 3:41 pm to Dr. Leroy Kennedy, who verbally acknowledged these results. Electronically Signed   By: Norva Pavlov M.D.   On: 07/06/2015 15:41   Mr [name] Wo Contrast  07/06/2015  CLINICAL DATA:  74 y.o. female brought in with acute onset left face weakness and word finding difficulty. Suspect right subcortical infarct. NIHSS 8, but her deficits are old except for speech impediment and mild left face weakness, and thus did not treat her with thrombolytic therapy or endovascular intervention.Admit to medicine and complete stroke work up. She is already on dual antiplatelet therapy and will continue this pending results stroke work up. EXAM: MRI HEAD WITHOUT CONTRAST MRA HEAD WITHOUT CONTRAST TECHNIQUE: Multiplanar, multiecho pulse sequences of the brain and surrounding structures were obtained without intravenous contrast. Angiographic images of the head were obtained using MRA technique without contrast. COMPARISON:  CT head earlier today FINDINGS: MRI HEAD FINDINGS No evidence for acute infarction, hemorrhage, mass lesion, hydrocephalus, or extra-axial fluid. Generalized atrophy. Chronic microvascular ischemic change. No midline abnormality. Flow voids are maintained. Extracranial soft tissues unremarkable. Mild chronic sinus disease. BILATERAL cataract extraction. Similar appearance to earlier CT. MRA HEAD FINDINGS Dolichoectatic but widely patent  internal carotid arteries. Widely patent basilar artery with vertebrals codominant. No A1 or M1 stenosis of significance. Focal narrowing proximal LEFT P1 PCA segment 50-75% stenosis. Uncertain significance given the lack of acute or chronic LEFT occipital infarction. No cerebellar branch occlusion. No visible saccular aneurysm. IMPRESSION: Atrophy and small vessel disease.  No acute intracranial findings. 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 Electronically Signed   By: Elsie Stain M.D.   On: 07/06/2015 18:42   Mr Brain Wo Contrast  07/06/2015  CLINICAL DATA:  74 y.o. female brought in with acute onset left face weakness and word finding difficulty. Suspect right subcortical infarct. NIHSS 8, but her deficits are old except for speech impediment and mild left face weakness, and thus did not treat her with thrombolytic therapy or endovascular intervention.Admit to medicine and complete stroke work up. She is already on dual antiplatelet therapy and will continue this pending results stroke work up. EXAM: MRI HEAD WITHOUT CONTRAST MRA HEAD WITHOUT CONTRAST TECHNIQUE: Multiplanar, multiecho pulse sequences of the brain and surrounding structures were obtained without intravenous contrast. Angiographic  images of the head were obtained using MRA technique without contrast. COMPARISON:  CT head earlier today FINDINGS: MRI HEAD FINDINGS No evidence for acute infarction, hemorrhage, mass lesion, hydrocephalus, or extra-axial fluid. Generalized atrophy. Chronic microvascular ischemic change. No midline abnormality. Flow voids are maintained. Extracranial soft tissues unremarkable. Mild chronic sinus disease. BILATERAL cataract extraction. Similar appearance to earlier CT. MRA HEAD FINDINGS Dolichoectatic but widely patent internal carotid arteries. Widely patent basilar artery with vertebrals codominant. No A1 or M1 stenosis of significance. Focal narrowing proximal LEFT P1 PCA  segment 50-75% stenosis. Uncertain significance given the lack of acute or chronic LEFT occipital infarction. No cerebellar branch occlusion. No visible saccular aneurysm. IMPRESSION: Atrophy and small vessel disease.  No acute intracranial findings. 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 Electronically Signed   By: Elsie Stain M.D.   On: 07/06/2015 18:42      No results found for: HGBA1C Lab Results  Component Value Date   LDLCALC 82 07/07/2015   CREATININE 0.72 07/07/2015       Scheduled Meds: . aspirin EC  81 mg Oral QHS  . atorvastatin  40 mg Oral QHS  . brimonidine  1 drop Both Eyes BID  . clopidogrel  75 mg Oral Daily  . famotidine  20 mg Oral Daily  . insulin aspart  0-15 Units Subcutaneous TID WC  . insulin aspart  0-5 Units Subcutaneous QHS  . insulin glargine  12 Units Subcutaneous QHS  . latanoprost  1 drop Both Eyes QHS  . nitroGLYCERIN  0.4 mg Sublingual Once  . sodium chloride  3 mL Intravenous Q12H  . sodium chloride  3 mL Intravenous Q12H  . timolol  1 drop Both Eyes BID   Continuous Infusions: . heparin 800 Units/hr (07/07/15 0121)    Active Problems:   Stroke (HCC)   Diabetes mellitus with complication (HCC)   Essential hypertension   HLD (hyperlipidemia)   Chest pain   Memory deficit   Confusional state    Time spent: 45 minutes   [facility] : [facility]  [facility] Pager [phone]. If 7PM-7AM, please contact night-coverage at [email or web address], password [facility] 07/07/2015, 12:43 PM  LOS: 1 day

## 2015-07-08 ENCOUNTER — Inpatient Hospital Stay (HOSPITAL_COMMUNITY): Payer: Medicare Other

## 2015-07-08 DIAGNOSIS — I25110 Atherosclerotic heart disease of native coronary artery with unstable angina pectoris: Secondary | ICD-10-CM

## 2015-07-08 DIAGNOSIS — R7989 Other specified abnormal findings of blood chemistry: Secondary | ICD-10-CM

## 2015-07-08 DIAGNOSIS — M6289 Other specified disorders of muscle: Secondary | ICD-10-CM

## 2015-07-08 DIAGNOSIS — R569 Unspecified convulsions: Secondary | ICD-10-CM

## 2015-07-08 LAB — COMPREHENSIVE METABOLIC PANEL
ALK PHOS: 44 U/L (ref 38–126)
ALT: 17 U/L (ref 14–54)
ANION GAP: 7 (ref 5–15)
AST: 19 U/L (ref 15–41)
Albumin: 3.1 g/dL — ABNORMAL LOW (ref 3.5–5.0)
BILIRUBIN TOTAL: 0.4 mg/dL (ref 0.3–1.2)
BUN: 14 mg/dL (ref 6–20)
CALCIUM: 9.1 mg/dL (ref 8.9–10.3)
CO2: 26 mmol/L (ref 22–32)
Chloride: 104 mmol/L (ref 101–111)
Creatinine, Ser: 0.97 mg/dL (ref 0.44–1.00)
GFR, EST NON AFRICAN AMERICAN: 56 mL/min — AB (ref >60)
Glucose, Bld: 230 mg/dL — ABNORMAL HIGH (ref 65–99)
Potassium: 4.6 mmol/L (ref 3.5–5.1)
SODIUM: 137 mmol/L (ref 135–145)
TOTAL PROTEIN: 5.8 g/dL — AB (ref 6.5–8.1)

## 2015-07-08 LAB — GLUCOSE, CAPILLARY
GLUCOSE-CAPILLARY: 175 mg/dL — AB (ref 65–99)
GLUCOSE-CAPILLARY: 201 mg/dL — AB (ref 65–99)
Glucose-Capillary: 179 mg/dL — ABNORMAL HIGH (ref 65–99)
Glucose-Capillary: 321 mg/dL — ABNORMAL HIGH (ref 65–99)

## 2015-07-08 LAB — CBC
HEMATOCRIT: 33.2 % — AB (ref 36.0–46.0)
Hemoglobin: 10.7 g/dL — ABNORMAL LOW (ref 12.0–15.0)
MCH: 30.2 pg (ref 26.0–34.0)
MCHC: 32.2 g/dL (ref 30.0–36.0)
MCV: 93.8 fL (ref 78.0–100.0)
PLATELETS: 150 10*3/uL (ref 150–400)
RBC: 3.54 MIL/uL — AB (ref 3.87–5.11)
RDW: 13.1 % (ref 11.5–15.5)
WBC: 5.2 10*3/uL (ref 4.0–10.5)

## 2015-07-08 LAB — HEMOGLOBIN A1C
HEMOGLOBIN A1C: 7.7 % — AB (ref 4.8–5.6)
Mean Plasma Glucose: 174 mg/dL

## 2015-07-08 LAB — VITAMIN B12: VITAMIN B 12: 310 pg/mL (ref 180–914)

## 2015-07-08 LAB — HEPARIN LEVEL (UNFRACTIONATED)
HEPARIN UNFRACTIONATED: 0.35 [IU]/mL (ref 0.30–0.70)
HEPARIN UNFRACTIONATED: 0.26 [IU]/mL — AB (ref 0.30–0.70)
HEPARIN UNFRACTIONATED: 0.57 [IU]/mL (ref 0.30–0.70)

## 2015-07-08 LAB — RPR: RPR Ser Ql: NONREACTIVE

## 2015-07-08 LAB — HIV ANTIBODY (ROUTINE TESTING W REFLEX): HIV SCREEN 4TH GENERATION: NONREACTIVE

## 2015-07-08 MED ORDER — SODIUM CHLORIDE 0.9 % IV BOLUS (SEPSIS): 500.0000 mL | Freq: Once | INTRAVENOUS | Status: DC

## 2015-07-08 MED ORDER — LEVETIRACETAM ER 500 MG PO TB24
500.0000 mg | ORAL_TABLET | Freq: Every day | ORAL | Status: DC
  Administered 2015-07-08 – 2015-07-09 (×2): 500 mg via ORAL
  Filled 2015-07-08 (×2): qty 1

## 2015-07-08 NOTE — Progress Notes (Signed)
ANTICOAGULATION CONSULT NOTE - Follow Up Consult  Pharmacy Consult for Heparin  Indication: NSTEMI  Allergies  Allergen Reactions  . Vicodin [Hydrocodone-Acetaminophen] Nausea And Vomiting    Patient Measurements: Height: 5\' 3"  (160 cm) Weight: 147 lb 4.3 oz (66.8 kg) IBW/kg (Calculated) : 52.4  Vital Signs: Temp: 98 F (36.7 C) (11/13 2319) Temp Source: Oral (11/13 2319) BP: 127/65 mmHg (11/13 1600) Pulse Rate: 58 (11/13 1600)  Labs:  Recent Labs  07/06/15 1510 07/06/15 1531 07/06/15 2038 07/07/15 0149 07/07/15 0755 07/07/15 0920 07/07/15 1534 07/08/15 0050  HGB 12.4 14.6  --   --  12.2  --   --  10.7*  HCT 37.9 43.0  --   --  37.8  --   --  33.2*  PLT 196  --   --   --  173  --   --  150  APTT 29  --   --   --   --   --   --   --   LABPROT 14.0  --   --   --   --   --   --   --   INR 1.06  --   --   --   --   --   --   --   HEPARINUNFRC  --   --   --   --  0.36  --  0.27* 0.26*  CREATININE 0.79 0.70  --   --   --  0.72  --  0.97  TROPONINI  --   --  0.89* 1.28*  --   --   --   --     Estimated Creatinine Clearance: 46 mL/min (by C-G formula based on Cr of 0.97).  Assessment: Sub-therapeutic heparin level, not a CABG/PCI candidate for now, TIA work-up in place  Goal of Therapy:  Heparin level 0.3-0.5 units/ml Monitor platelets by anticoagulation protocol: Yes   Plan:  -Increase heparin to 1050 units/hr -1000 HL  Leah Small 07/08/2015,1:53 AM

## 2015-07-08 NOTE — Progress Notes (Signed)
ANTICOAGULATION CONSULT NOTE - Follow Up Consult  Pharmacy Consult for Heparin  Indication: NSTEMI  Allergies  Allergen Reactions  . Vicodin [Hydrocodone-Acetaminophen] Nausea And Vomiting    Patient Measurements: Height: 5\' 3"  (160 cm) Weight: 147 lb 4.3 oz (66.8 kg) IBW/kg (Calculated) : 52.4  Vital Signs: Temp: 98.1 F (36.7 C) (11/14 0801) Temp Source: Oral (11/14 0801) BP: 166/90 mmHg (11/14 0801) Pulse Rate: 63 (11/14 0801)  Labs:  Recent Labs  07/06/15 1510 07/06/15 1531 07/06/15 2038 07/07/15 0149  07/07/15 0755 07/07/15 0920 07/07/15 1534 07/08/15 0050 07/08/15 0940  HGB 12.4 14.6  --   --   --  12.2  --   --  10.7*  --   HCT 37.9 43.0  --   --   --  37.8  --   --  33.2*  --   PLT 196  --   --   --   --  173  --   --  150  --   APTT 29  --   --   --   --   --   --   --   --   --   LABPROT 14.0  --   --   --   --   --   --   --   --   --   INR 1.06  --   --   --   --   --   --   --   --   --   HEPARINUNFRC  --   --   --   --   < > 0.36  --  0.27* 0.26* 0.35  CREATININE 0.79 0.70  --   --   --   --  0.72  --  0.97  --   TROPONINI  --   --  0.89* 1.28*  --   --   --   --   --   --   < > = values in this interval not displayed.  Estimated Creatinine Clearance: 46 mL/min (by C-G formula based on Cr of 0.97).  Assessment: [age] YOF here with stroke-like event, had another neuro event this morning. Suspected to be seizures. On heparin for ACS- plans to continue for 48h (would be through 11/15 AM)- not a CABG/PCI candidate.  HL was sub-therapeutic and rate was increased early this morning. Level is now therapeutic at 0.35 units/mL.   Hgb dropped, plts relatively stable. No bleeding noted.  Goal of Therapy:  Heparin level 0.3-0.5 units/ml Monitor platelets by anticoagulation protocol: Yes   Plan:  -continue heparin infusion at1050 units/hr -HL at 1600 to confirm new rate -daily HL and CBC -follow for LOT on heparin- anticipate it will be stopped tomorrow  morning  Rhenda [place] D. Saydee Zolman, PharmD, BCPS Clinical Pharmacist Pager: [phone] 07/08/2015 11:04 AM

## 2015-07-08 NOTE — Progress Notes (Signed)
Physical Therapy Treatment Patient Details Name: Leah Small MRN: [id number] DOB: 27-Dec-1940 Today's Date: 07/08/2015    History of Present Illness Pt is a 74 yo female with baseline left-sided weakness which appeared to progress in upper and lower extremity today at around 1330. Additionally patient noted to have some slurred speech at that time. These deficits were noted by patient's son. Symptoms are constant but appear to be slowly improving per the son. Patient also some baseline confusion which seems to be unchanged. Pt w/ stroke like episode mornign of 11/14 w/ Lt side facial droop, Lt side gaze, and inability to speak which resolved as day progressed.  Normal EEG.     PT Comments    Pt remains most appropriate for SNF at d/c as she is quick to fatigue and requires supervision for all mobility and safety.  Pt w/ dec gait speed, placing her at inc risk for future falls. Ambulated 100 ft in hallway w/ RW today.  Pt will benefit from continued skilled PT services to increase functional independence and safety.   Follow Up Recommendations  SNF;Supervision/Assistance - 24 hour     Equipment Recommendations  None recommended by PT    Recommendations for Other Services       Precautions / Restrictions Precautions Precautions: Fall Restrictions Weight Bearing Restrictions: No    Mobility  Bed Mobility Overal bed mobility: Needs Assistance Bed Mobility: Supine to Sit     Supine to sit: Min guard     General bed mobility comments: HOB elevated, use of bed rail, increased time  Transfers Overall transfer level: Needs assistance Equipment used: Rolling walker (2 wheeled) Transfers: Sit to/from Stand Sit to Stand: Min assist         General transfer comment: Min assist stabilizing RW and cues for proper hand placement and technique for sit<>stand.  Ambulation/Gait Ambulation/Gait assistance: Min guard Ambulation Distance (Feet): 100 Feet Assistive device: Rolling  walker (2 wheeled) Gait Pattern/deviations: Step-through pattern;Antalgic;Trunk flexed Gait velocity: slow Gait velocity interpretation: <1.8 ft/sec, indicative of risk for recurrent falls General Gait Details: Pt required rest break after ambulating 75 ft 2/2 generalized fatigue and Lt LE tightness.     Stairs            Wheelchair Mobility    Modified Rankin (Stroke Patients Only) Modified Rankin (Stroke Patients Only) Pre-Morbid Rankin Score: Slight disability Modified Rankin: Moderate disability     Balance Overall balance assessment: Needs assistance Sitting-balance support: Feet supported Sitting balance-Leahy Scale: Good     Standing balance support: Bilateral upper extremity supported;During functional activity Standing balance-Leahy Scale: Poor Standing balance comment: RW for support and pt's safety                    Cognition Arousal/Alertness: Awake/alert Behavior During Therapy: WFL for tasks assessed/performed Overall Cognitive Status: Impaired/Different from baseline Area of Impairment: Problem solving             Problem Solving: Slow processing      Exercises      General Comments General comments (skin integrity, edema, etc.): Pt remains most appropriate for SNF at d/c as she is quick to fatigue and requires supervision for all mobility and safety.  Pt w/ dec gait speed, placing her at inc risk for future falls.       Pertinent Vitals/Pain Pain Assessment: Faces Faces Pain Scale: Hurts a little bit Pain Location: Lt LE w/ ambulation Pain Descriptors / Indicators: Tightness Pain Intervention(s): Limited activity within  patient's tolerance;Monitored during session    Home Living                      Prior Function            PT Goals (current goals can now be found in the care plan section) Acute Rehab PT Goals Patient Stated Goal: to eventually get back to my normal routine PT Goal Formulation: With  patient/family Time For Goal Achievement: 07/14/15 Potential to Achieve Goals: Good Progress towards PT goals: Progressing toward goals    Frequency  Min 4X/week    PT Plan Current plan remains appropriate    Co-evaluation             End of Session Equipment Utilized During Treatment: Gait belt Activity Tolerance: Patient limited by fatigue;Patient tolerated treatment well Patient left: with call bell/phone within reach;with family/visitor present;in bed (sitting EOB)     Time: [id number] PT Time Calculation (min) (ACUTE ONLY): 18 min  Charges:  $Gait Training: 8-22 mins                    G Codes:      Michail JewelsAshley Parr PT, [place]PT [phone] Pager: [phone] 07/08/2015, 3:47 PM

## 2015-07-08 NOTE — Progress Notes (Signed)
SUBJECTIVE:  No CP.  Another apparent neuro event as described.  OBJECTIVE:   Vitals:   Filed Vitals:   07/08/15 0500 07/08/15 0700 07/08/15 0733 07/08/15 0801  BP: 122/59 119/61 198/108 166/90  Pulse: 56 55  63  Temp:    98.1 F (36.7 C)  TempSrc:    Oral  Resp: Height:      Weight:      SpO2: 97% 97%  97%   I&O's:   Intake/Output Summary (Last 24 hours) at 07/08/15 0954 Last data filed at 07/08/15 0801  Gross per 24 hour  Intake 699.35 ml  Output   2350 ml  Net -1650.65 ml   TELEMETRY: Reviewed telemetry pt in NSR:     PHYSICAL EXAM General: Well developed, well nourished, in no acute distress Head:   Normal cephalic and atramatic  Lungs:   Clear bilaterally to auscultation. Heart:   HRRR S1 S2  No JVD.   Abdomen: abdomen soft and non-tender Msk:  Back normal,  Normal strength and tone for age. Extremities:   No edema.   Neuro: Alert and oriented. Psych:  Normal affect, responds appropriately Skin: No rash   LABS: Basic Metabolic Panel:  Recent Labs  16/10/96 0920 07/08/15 0050  NA 137 137  K 4.2 4.6  CL 103 104  CO2 26 26  GLUCOSE 191* 230*  BUN 10 14  CREATININE 0.72 0.97  CALCIUM 9.2 9.1   Liver Function Tests:  Recent Labs  07/07/15 0920 07/08/15 0050  AST 25 19  ALT 18 17  ALKPHOS 52 44  BILITOT 0.7 0.4  PROT 6.8 5.8*  ALBUMIN 3.7 3.1*   No results for input(s): LIPASE, AMYLASE in the last 72 hours. CBC:  Recent Labs  07/06/15 1510  07/07/15 0755 07/08/15 0050  WBC 5.5  --  5.2 5.2  NEUTROABS 3.0  --   --   --   HGB 12.4  < > 12.2 10.7*  HCT 37.9  < > 37.8 33.2*  MCV 92.9  --  94.5 93.8  PLT 196  --  173 150  < > = values in this interval not displayed. Cardiac Enzymes:  Recent Labs  07/06/15 2038 07/07/15 0149  TROPONINI 0.89* 1.28*   BNP: Invalid input(s): POCBNP D-Dimer: No results for input(s): DDIMER in the last 72 hours. Hemoglobin A1C: No results for input(s): HGBA1C in the last 72  hours. Fasting Lipid Panel:  Recent Labs  07/07/15 0149  CHOL 158  HDL 43  LDLCALC 82  TRIG 167*  CHOLHDL 3.7   Thyroid Function Tests:  Recent Labs  07/07/15 1534  TSH 2.915   Anemia Panel: No results for input(s): VITAMINB12, FOLATE, FERRITIN, TIBC, IRON, RETICCTPCT in the last 72 hours. Coag Panel:   Lab Results  Component Value Date   INR 1.06 07/06/2015   INR 0.9 09/16/2011    RADIOLOGY: Ct Head Wo Contrast  07/06/2015  CLINICAL DATA:  CODE STROKE, DR. Leroy Kennedy [phone], LEFT SIDE WEAKNESS, last seen normal at 1400, HX CVA. EXAM: CT HEAD WITHOUT CONTRAST TECHNIQUE: Contiguous axial images were obtained from the base of the skull through the vertex without intravenous contrast. COMPARISON:  MRI 02/06/2011, CT HEAD 08/09/2010 FINDINGS: There is central and cortical atrophy. There is no intra or extra-axial fluid collection or mass lesion. The basilar cisterns and ventricles have a normal appearance. There is no CT evidence for acute infarction or hemorrhage. Bone windows show mild mucoperiosteal thickening of the  ethmoid air cells. No acute calvarial injury. Mastoid air cells are normally aerated. IMPRESSION: 1. Atrophy. 2.  No evidence for acute intracranial abnormality. 3. Mild chronic sinusitis. Critical Value/emergent results were called by telephone at the time of interpretation on 07/06/2015 at 3:41 pm to Dr. Leroy Kennedyamilo, who verbally acknowledged these results. Electronically Signed   By: Norva PavlovElizabeth  Brown M.D.   On: 07/06/2015 15:41   Mr Shirlee LatchMra Head Wo Contrast  07/06/2015  CLINICAL DATA:  74 y.o. female brought in with acute onset left face weakness and word finding difficulty. Suspect right subcortical infarct. NIHSS 8, but her deficits are old except for speech impediment and mild left face weakness, and thus did not treat her with thrombolytic therapy or endovascular intervention.Admit to medicine and complete stroke work up. She is already on dual antiplatelet therapy and  will continue this pending results stroke work up. EXAM: MRI HEAD WITHOUT CONTRAST MRA HEAD WITHOUT CONTRAST TECHNIQUE: Multiplanar, multiecho pulse sequences of the brain and surrounding structures were obtained without intravenous contrast. Angiographic images of the head were obtained using MRA technique without contrast. COMPARISON:  CT head earlier today FINDINGS: MRI HEAD FINDINGS No evidence for acute infarction, hemorrhage, mass lesion, hydrocephalus, or extra-axial fluid. Generalized atrophy. Chronic microvascular ischemic change. No midline abnormality. Flow voids are maintained. Extracranial soft tissues unremarkable. Mild chronic sinus disease. BILATERAL cataract extraction. Similar appearance to earlier CT. MRA HEAD FINDINGS Dolichoectatic but widely patent internal carotid arteries. Widely patent basilar artery with vertebrals codominant. No A1 or M1 stenosis of significance. Focal narrowing proximal LEFT P1 PCA segment 50-75% stenosis. Uncertain significance given the lack of acute or chronic LEFT occipital infarction. No cerebellar branch occlusion. No visible saccular aneurysm. IMPRESSION: Atrophy and small vessel disease.  No acute intracranial findings. 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 Electronically Signed   By: Elsie StainJohn T Curnes M.D.   On: 07/06/2015 18:42   Mr Brain Wo Contrast  07/06/2015  CLINICAL DATA:  74 y.o. female brought in with acute onset left face weakness and word finding difficulty. Suspect right subcortical infarct. NIHSS 8, but her deficits are old except for speech impediment and mild left face weakness, and thus did not treat her with thrombolytic therapy or endovascular intervention.Admit to medicine and complete stroke work up. She is already on dual antiplatelet therapy and will continue this pending results stroke work up. EXAM: MRI HEAD WITHOUT CONTRAST MRA HEAD WITHOUT CONTRAST TECHNIQUE: Multiplanar, multiecho pulse sequences of  the brain and surrounding structures were obtained without intravenous contrast. Angiographic images of the head were obtained using MRA technique without contrast. COMPARISON:  CT head earlier today FINDINGS: MRI HEAD FINDINGS No evidence for acute infarction, hemorrhage, mass lesion, hydrocephalus, or extra-axial fluid. Generalized atrophy. Chronic microvascular ischemic change. No midline abnormality. Flow voids are maintained. Extracranial soft tissues unremarkable. Mild chronic sinus disease. BILATERAL cataract extraction. Similar appearance to earlier CT. MRA HEAD FINDINGS Dolichoectatic but widely patent internal carotid arteries. Widely patent basilar artery with vertebrals codominant. No A1 or M1 stenosis of significance. Focal narrowing proximal LEFT P1 PCA segment 50-75% stenosis. Uncertain significance given the lack of acute or chronic LEFT occipital infarction. No cerebellar branch occlusion. No visible saccular aneurysm. IMPRESSION: Atrophy and small vessel disease.  No acute intracranial findings. 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 Electronically Signed   By: Elsie StainJohn T Curnes M.D.   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  PLAN:  Stable from a cardiac standpoint.  No angina.  Could restart NTG patch if angina recurs.  Would restart certainly at discharge.  BP was too low initially but better now.  Will try to get records of last cath from Dr. Gwen Pounds.  Medical therapy of NSTEMI.  Heparin for 48 hours.    Would not plan on repeat cath at this time given that she had no PCI targets in the past.  Primary issues appears to be neurologic at this time.  Corky Crafts, MD  07/08/2015  9:54 AM

## 2015-07-08 NOTE — Progress Notes (Signed)
EEG completed; results pending.    

## 2015-07-08 NOTE — Progress Notes (Signed)
PT Cancellation Note  Patient Details Name: Cedric FishmanDonnie R Wee MRN: [id number] DOB: [birth year]   Cancelled Treatment:    Reason Eval/Treat Not Completed: Patient at procedure or test/unavailable (having EEG done).  Will continue to follow pt acutely.  Michail JewelsAshley Parr PT, DPT [phone] Pager: [phone] 07/08/2015, 1:59 PM

## 2015-07-08 NOTE — Progress Notes (Signed)
7:30 Pt son called for Nurse to come to bedside. Pt was found dangling on bed side and to have L side facial draw and droop with rhythmic chewing and L side glaze. Moderate hand grip on the L side.  Unable to speak at during episode, which lasted 2 and half minutes. Once resolved patient able to verbalize at a slurred and delayed response rate, but appropriate answers provided. BP was 198/95. HR 64. O2 97% on RA. RR16. Pt felt pain in the L--Occipital area of head. Pt became fatigued and tearful after the episode resolve. Pt is now lying back in bed, speaking clearly, and VSS with---BP 198/95 remaining elevated.    Dr. Amada JupiterKirkpatrick was notified of event. Will continue to monitor.

## 2015-07-08 NOTE — Clinical Social Work Note (Signed)
Clinical Social Work Assessment  Patient Details  Name: Leah Small MRN: [id number] Date of Birth: June 16, 1941  Date of referral:  07/08/15               Reason for consult:  Facility Placement                Permission sought to share information with:  Family Supports Permission granted to share information::  Yes, Verbal Permission Granted  Name::     Pamer,Donald L  Relationship::  son   Housing/Transportation Living arrangements for the past 2 months:  Single Family Home Source of Information:  Adult Children Patient Interpreter Needed:  None Criminal Activity/Legal Involvement Pertinent to Current Situation/Hospitalization:  No - Comment as needed Significant Relationships:  Adult Children Lives with:  Self Do you feel safe going back to the place where you live?  No Need for family participation in patient care:  Yes (Comment)  Care giving concerns: None    Social Worker assessment / plan:  CSW met the pt at the bedside. CSW explained the purpose of the visit. The pt asked the CSW to call her son Elenore Rota. CSW spoke with Tawana Scale shared that he would like the pt to go to [facility] or [facility] CSW explained insurance and how it relates to SNF. CSW provided Elenore Rota with the CSW's contact information for further questions.   Employment status:  Retired Forensic scientist:  Managed Care PT Recommendations:  [place] / Referral to community resources:  [place]  Patient/Family's Response to care:  Elenore Rota shared that he will never send the pt to another hospital because the staff always care for the pt well.   Patient/Family's Understanding of and Emotional Response to Diagnosis, Current Treatment, and Prognosis: Elenore Rota acknowledged the pt's diagnosis and current treatment plan. Elenore Rota wishes for the pt to regain her strength so she can return home.    Emotional Assessment Appearance:  Appears stated  age Attitude/Demeanor/Rapport:   (Calm ) Affect (typically observed):  Appropriate Orientation:  Oriented to Place, Oriented to Self, Oriented to Situation Alcohol / Substance use:  Not Applicable Psych involvement (Current and /or in the community):  No (Comment)  Discharge Needs  Concerns to be addressed:  Denies Needs/Concerns at this time Readmission within the last 30 days:  No Current discharge risk:  None Barriers to Discharge:  No Barriers Identified   Sayge Brienza, LCSW 07/08/2015, 5:55 PM

## 2015-07-08 NOTE — NC FL2 (Signed)
[place] MEDICAID FL2 LEVEL OF CARE SCREENING TOOL     IDENTIFICATION  Patient Name: Leah Small Birthdate: 1941/07/11 Sex: female Admission Date (Current Location): 07/06/2015  [facility] and [place] Number: Facility and Address:  [facility] [place], [address], [place], [place] [address]      Provider Number: [id number]  Attending Physician Name and Address:  Richarda Overlie, MD  Relative Name and Phone Number:       Current Level of Care: Hospital Recommended Level of Care: Skilled Nursing Facility Prior Approval Number:    Date Approved/Denied:   PASRR Number:   [id number] A SSN [id number]  Discharge Plan: SNF    Current Diagnoses: Patient Active Problem List   Diagnosis Date Noted  . Memory deficit   . Confusional state   . Sepsis (HCC) 07/06/2015  . Stroke (HCC) 07/06/2015  . Diabetes mellitus with complication (HCC) 07/06/2015  . Essential hypertension 07/06/2015  . HLD (hyperlipidemia) 07/06/2015  . Chest pain 07/06/2015  . Left-sided weakness   . Stroke-like symptoms     Orientation ACTIVITIES/SOCIAL BLADDER RESPIRATION    Situation, Place, Self  Family supportive Continent O2 (As needed) (3L/min)  BEHAVIORAL SYMPTOMS/MOOD NEUROLOGICAL BOWEL NUTRITION STATUS      Continent Diet (CARB MODIFIED)  PHYSICIAN VISITS COMMUNICATION OF NEEDS Height & Weight Skin       (160 cm) 147 lbs. Normal          AMBULATORY STATUS RESPIRATION    Supervision limited O2 (As needed) (3L/min)      Personal Care Assistance Level of Assistance   Bathing  Dressing   Some Assist           Functional Limitations Info                SPECIAL CARE FACTORS FREQUENCY  PT (By licensed PT)  OT(By licensed OT)                  Additional Factors Info  Code Status, Allergies Code Status Info:  (FULL) Allergies Info:  (vicodin)           Current Medications (07/08/2015): Current Facility-Administered Medications   Medication Dose Route Frequency Provider Last Rate Last Dose  . 0.9 %  sodium chloride infusion  250 mL Intravenous PRN Ozella Rocks, MD      . acetaminophen (TYLENOL) tablet 650 mg  650 mg Oral Q6H PRN Ozella Rocks, MD   650 mg at 07/07/15 2029   Or  . acetaminophen (TYLENOL) suppository 650 mg  650 mg Rectal Q6H PRN Ozella Rocks, MD      . aspirin EC tablet 81 mg  81 mg Oral QHS Ozella Rocks, MD   81 mg at 07/07/15 2028  . atorvastatin (LIPITOR) tablet 40 mg  40 mg Oral QHS Nevin Bloodgood, MD   40 mg at 07/07/15 2200  . brimonidine (ALPHAGAN) 0.2 % ophthalmic solution 1 drop  1 drop Both Eyes BID Ozella Rocks, MD   1 drop at 07/08/15 1011  . clopidogrel (PLAVIX) tablet 75 mg  75 mg Oral Daily Ozella Rocks, MD   75 mg at 07/08/15 1010  . famotidine (PEPCID) tablet 20 mg  20 mg Oral Daily Ozella Rocks, MD   20 mg at 07/08/15 1010  . gi cocktail (Maalox,Lidocaine,Donnatal)  30 mL Oral TID PRN Ozella Rocks, MD      . heparin ADULT infusion 100 units/mL (25000 units/250 mL)  1,050  Units/hr Intravenous Continuous Stevphen Rochester, RPH 10.5 mL/hr at 07/08/15 0900 1,050 Units/hr at 07/08/15 0900  . hydrALAZINE (APRESOLINE) injection 5-10 mg  5-10 mg Intravenous Q4H PRN Ozella Rocks, MD      . insulin aspart (novoLOG) injection 0-15 Units  0-15 Units Subcutaneous TID WC Ozella Rocks, MD   3 Units at 07/08/15 1010  . insulin aspart (novoLOG) injection 0-5 Units  0-5 Units Subcutaneous QHS Ozella Rocks, MD   2 Units at 07/06/15 2235  . insulin glargine (LANTUS) injection 12 Units  12 Units Subcutaneous QHS Ozella Rocks, MD   12 Units at 07/07/15 2309  . latanoprost (XALATAN) 0.005 % ophthalmic solution 1 drop  1 drop Both Eyes QHS Ozella Rocks, MD   1 drop at 07/07/15 2307  . levETIRAcetam (KEPPRA XR) 24 hr tablet 500 mg  500 mg Oral Daily Layne Benton, NP      . nitroGLYCERIN (NITROSTAT) SL tablet 0.4 mg  0.4 mg Sublingual Once Rolan Lipa, NP   Stopped at  07/06/15 2215  . nitroGLYCERIN (NITROSTAT) SL tablet 0.4 mg  0.4 mg Sublingual Q5 min PRN Rolan Lipa, NP      . ondansetron [facility]) tablet 4 mg  4 mg Oral Q6H PRN Ozella Rocks, MD       Or  . ondansetron [facility]) injection 4 mg  4 mg Intravenous Q6H PRN Ozella Rocks, MD      . sodium chloride 0.9 % bolus 500 mL  500 mL Intravenous Once Rolan Lipa, NP      . sodium chloride 0.9 % injection 3 mL  3 mL Intravenous Q12H Ozella Rocks, MD   3 mL at 07/07/15 2200  . sodium chloride 0.9 % injection 3 mL  3 mL Intravenous Q12H Ozella Rocks, MD   3 mL at 07/07/15 2309  . sodium chloride 0.9 % injection 3 mL  3 mL Intravenous PRN Ozella Rocks, MD      . timolol (BETIMOL) 0.5 % ophthalmic solution 1 drop  1 drop Both Eyes BID Ozella Rocks, MD   1 drop at 07/08/15 1000   Do not use this list as official medication orders. Please verify with discharge summary.  Discharge Medications:   Medication List    ASK your doctor about these medications        amLODipine 2.5 MG tablet  Commonly known as:  NORVASC  Take 2.5 mg by mouth at bedtime.     aspirin EC 81 MG tablet  Take 81 mg by mouth at bedtime.     atorvastatin 20 MG tablet  Commonly known as:  LIPITOR  Take 20 mg by mouth at bedtime.     bimatoprost 0.01 % Soln  Commonly known as:  LUMIGAN  Place 1 drop into both eyes at bedtime.     brimonidine 0.2 % ophthalmic solution  Commonly known as:  ALPHAGAN  Place 1 drop into both eyes 2 (two) times daily.     CALCIUM PO  Take 1 tablet by mouth daily.     clopidogrel 75 MG tablet  Commonly known as:  PLAVIX  Take 75 mg by mouth daily.     glipiZIDE 10 MG 24 hr tablet  Commonly known as:  GLUCOTROL XL  Take 10 mg by mouth daily with breakfast.     HUMALOG MIX 75/25 KWIKPEN (75-25) 100 UNIT/ML Kwikpen  Generic drug:  Insulin Lispro Prot & Lispro  Inject 6-12 Units into the skin 4 (four) times daily - after meals and at bedtime. Per sliding  scale: 6 units plus:   CBG 150-200 add 1 unit, 201-250 add 2 units, 251-300 add 3 units, 301-350 add 4 units 351-400 add 5 units, >400 add 6 units and call MD     isosorbide mononitrate 30 MG 24 hr tablet  Commonly known as:  IMDUR  Take 30 mg by mouth at bedtime.     LANTUS SOLOSTAR 100 UNIT/ML Solostar Pen  Generic drug:  Insulin Glargine  Inject 24 Units into the skin at bedtime.     MAGNESIUM PO  Take 1 tablet by mouth daily.     metFORMIN 1000 MG tablet  Commonly known as:  GLUCOPHAGE  Take 1,000 mg by mouth 2 (two) times daily with a meal.     metoprolol tartrate 25 MG tablet  Commonly known as:  LOPRESSOR  Take 12.5 mg by mouth at bedtime.     nitroGLYCERIN 0.4 mg/hr patch  Commonly known as:  NITRODUR - Dosed in mg/24 hr  Place 0.4 mg onto the skin daily.     nitroGLYCERIN 0.4 MG SL tablet  Commonly known as:  NITROSTAT  Place 0.4 mg under the tongue every 5 (five) minutes as needed for chest pain.     nystatin cream  Commonly known as:  MYCOSTATIN  Apply 1 application topically 2 (two) times daily as needed (yeast infection).     OVER THE COUNTER MEDICATION  Take 1 tablet by mouth daily as needed (sinus congestion). Coricidin hbp     ranitidine 75 MG tablet  Commonly known as:  ZANTAC  Take 75 mg by mouth daily as needed for heartburn (acid reflux).     timolol 0.5 % ophthalmic solution  Commonly known as:  BETIMOL  Place 1 drop into both eyes 2 (two) times daily.     triamcinolone ointment 0.1 %  Commonly known as:  KENALOG  Apply 1 application topically 3 (three) times daily as needed (itching from psoriasis).     TYLENOL ARTHRITIS PAIN 650 MG CR tablet  Generic drug:  acetaminophen  Take 650-1,300 mg by mouth every 8 (eight) hours as needed for pain.     vitamin B-12 1000 MCG tablet  Commonly known as:  CYANOCOBALAMIN  Take 1,000 mcg by mouth daily.     vitamin E 400 UNIT capsule  Take 400 Units by mouth daily.     ZINC PO  Take 1 tablet by  mouth daily.        Relevant Imaging Results:  Relevant Lab Results:  Recent Labs    Additional Information    Clinical Social Worker  Delvis Kau, MSW, LCSW [phone]

## 2015-07-08 NOTE — Progress Notes (Signed)
STROKE TEAM PROGRESS NOTE   SUBJECTIVE (INTERVAL HISTORY) Patient recounted her history with Dr. Pearlean Small that she shared with Dr. Earl Small yesterday. Son at bedside also supported history.    OBJECTIVE Temp:  [97.5 F (36.4 C)-98.1 F (36.7 C)] 98.1 F (36.7 C) (11/14 0801) Pulse Rate:  [52-67] 63 (11/14 0801) Cardiac Rhythm:  [-] Normal sinus rhythm (11/14 0801) Resp:  [12-22] 19 (11/14 0801) BP: (89-198)/(49-108) 166/90 mmHg (11/14 0801) SpO2:  [92 %-98 %] 97 % (11/14 0801)  CBC:  Recent Labs Lab 07/06/15 1510  07/07/15 0755 07/08/15 0050  WBC 5.5  --  5.2 5.2  NEUTROABS 3.0  --   --   --   HGB 12.4  < > 12.2 10.7*  HCT 37.9  < > 37.8 33.2*  MCV 92.9  --  94.5 93.8  PLT 196  --  173 150  < > = values in this interval not displayed.  Basic Metabolic Panel:   Recent Labs Lab 07/07/15 0920 07/08/15 0050  NA 137 137  K 4.2 4.6  CL 103 104  CO2 26 26  GLUCOSE 191* 230*  BUN 10 14  CREATININE 0.72 0.97  CALCIUM 9.2 9.1    Lipid Panel:     Component Value Date/Time   CHOL 158 07/07/2015 0149   TRIG 167* 07/07/2015 0149   HDL 43 07/07/2015 0149   CHOLHDL 3.7 07/07/2015 0149   VLDL 33 07/07/2015 0149   LDLCALC 82 07/07/2015 0149   HgbA1c: No results found for: HGBA1C Urine Drug Screen:     Component Value Date/Time   LABOPIA NONE DETECTED 07/06/2015 1558   COCAINSCRNUR NONE DETECTED 07/06/2015 1558   LABBENZ NONE DETECTED 07/06/2015 1558   AMPHETMU NONE DETECTED 07/06/2015 1558   THCU NONE DETECTED 07/06/2015 1558   LABBARB NONE DETECTED 07/06/2015 1558      IMAGING  Ct Head Wo Contrast 07/06/2015   1. Atrophy.  2. No evidence for acute intracranial abnormality.  3. Mild chronic sinusitis.   MRI HEAD FINDINGS  07/06/2015   No evidence for acute infarction, hemorrhage, mass lesion, hydrocephalus, or extra-axial fluid. Generalized atrophy. Chronic microvascular ischemic change. No midline abnormality. Flow voids are maintained. Extracranial soft  tissues unremarkable. Mild chronic sinus disease. BILATERAL cataract extraction. Similar appearance to earlier CT.   MRA HEAD FINDINGS  07/06/2015  Dolichoectatic but widely patent internal carotid arteries. Widely patent basilar artery with vertebrals codominant. No A1 or M1 stenosis of significance. Focal narrowing proximal LEFT P1 PCA segment 50-75% stenosis. Uncertain significance given the lack of acute or chronic LEFT occipital infarction. No cerebellar branch occlusion. No visible saccular aneurysm. IMPRESSION:  Atrophy and small vessel disease.  No acute intracranial findings. 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   2D Echocardiogram  - Left ventricle: The cavity size was normal. Wall thickness wasnormal. Systolic function was normal. The estimated ejectionfraction was in the range of 55% to 60%. - Mitral valve: Calcified annulus. Mildly thickened leaflets.There was mild regurgitation. - Left atrium: The atrium was mildly dilated. - Tricuspid valve: There was moderate regurgitation. - Pulmonary arteries: PA peak pressure: 44 mm Hg (S).  Carotid Doppler   1-39% ICA stenosis. Bilateral CEAs appear patent. Vertebral artery flow is antegrade.      PHYSICAL EXAM General - Well nourished, well developed middle aged Caucasian lady, in  no acute distress   Cardiovascular - Regular rate and rhythm Pulmonary: CTA Abdomen: NT, ND, normal bowel sounds Extremities: No C/C/E  Neurological  Exam Mental Status: Normal Orientation:  Oriented to person, place and time Speech:  Fluent; no dysarthria Cranial Nerves:  PERRL; EOMI; visual fields full, face grossly symmetric, hearing grossly intact; shrug symmetric and tongue midline Motor Exam:  Tone:  Within normal limits; Strength: 5/5 on the right; 4/5 on the left (chronic) Sensory: Intact to light touch throughout the right side but decreased on the left (chronic) Coordination:  Intact finger to  nose Gait: Deferred   ASSESSMENT/PLAN [name] is a 74 y.o. female with history of coronary artery disease, S/P CABG, previous stroke with residual left hemiparesis, psoriasis, bilateral carotid endarterectomies, and diabetes mellitus, presenting with left facial weakness and language impairment. She did not receive IV t-PA due to minimal new deficits.   Partial complex seizure. Doubt TIA   Lethargic after onset  Recurrent similar events follow stressful situations   Recurrent episode this am  Resultant  Left sided weakness resolved  Prolonged EEG monitoring ordered. Change to routine EEG  Start Keppra 500 er daily  MRI - No acute intracranial findings.  MRA - 50-75% LEFT P1 stenosis  Carotid Doppler No significant stenosis   2D Echo No source of embolus   LDL - 82  HgbA1c pending   VTE prophylaxis - full dose heparin Diet Carb Modified Fluid consistency:: Thin; Room service appropriate?: Yes  aspirin 81 mg daily and clopidogrel 75 mg daily prior to admission, now on aspirin 81 mg daily, clopidogrel 75 mg daily and heparin IV. On IV heparin per cardiology for 48 h given STEMI.  No indication for full dose IV heparin from stroke standpoint after that time frame  Ongoing aggressive stroke risk factor management  Has [facility] neuro [facility] to assess for seizures. Keep appt and follow up with Leah Small  Therapy recommendations: pending   Disposition: anticipate return home  Hypertension  Mildly low at times - currently not on scheduled antihypertensive medications.  Hyperlipidemia  Home meds:  Lipitor 20 mg daily  LDL 82, goal < 70  Lipitor increased to 40 mg daily  Continue statin at discharge  Diabetes  HgbA1c pending , goal < 7.0  Uncontrolled  Other Stroke Risk Factors  Advanced age  Hx stroke/TIA  Family hx stroke (Mother)  Coronary artery disease  NSTEMI  In setting of severe  CAD  Leah Small tis am - continue Heparin x 48h, No further workup  Other Active Problems  Recent appt w/  ophthalmologist just told her she has significantly increased pressures in both eyes  Memory challenges.  Will send reversible causes of dementia labs pending   Hospital day # 2  Leah MoodyBIBY,SHARON  [facility] See Amion for Pager information 07/08/2015 10:37 AM  I have personally examined this patient, reviewed notes, independently viewed imaging studies, participated in medical decision making and plan of care. I have made any additions or clarifications directly to the above note. Agree with note above. . 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 She has had previous strokes but recurrent stereotypical nature of the episodes would favor seizures over TIAs. Recommend Keppra XR 500 mg daily and check EEG. Long discussion at the bedside with the patient and son and answered questions. Delia HeadyPramod Analysse Quinonez, MD Medical Director [facility] Pager: [phone] 07/08/2015 1:34 PM  To contact Stroke Continuity provider, please refer to [email or web address]. After hours, contact General Neurology

## 2015-07-08 NOTE — Clinical Social Work Placement (Signed)
   CLINICAL SOCIAL WORK PLACEMENT  NOTE  Date:  07/08/2015  Patient Details  Name: Leah Small MRN: [id number] Date of Birth: 07/11/1941  Clinical Social Work is seeking post-discharge placement for this patient at the   level of care (*CSW will initial, date and re-position this form in  chart as items are completed):  Yes   Patient/family provided with [facility] Clinical Social Work Department's list of facilities offering this level of care within the geographic area requested by the patient (or if unable, by the patient's family).  Yes   Patient/family informed of their freedom to choose among providers that offer the needed level of care, that participate in Medicare, Medicaid or managed care program needed by the patient, have an available bed and are willing to accept the patient.  Yes   Patient/family informed of [facility]'s ownership interest in [facility] and [facility], as well as of the fact that they are under no obligation to receive care at these facilities.  PASRR submitted to EDS on 07/08/15     PASRR number received on 07/08/15     Existing PASRR number confirmed on       FL2 transmitted to all facilities in geographic area requested by pt/family on       FL2 transmitted to all facilities within larger geographic area on       Patient informed that his/her managed care company has contracts with or will negotiate with certain facilities, including the following:            Patient/family informed of bed offers received.  Patient chooses bed at       Physician recommends and patient chooses bed at      Patient to be transferred to   on  .  Patient to be transferred to facility by       Patient family notified on   of transfer.  Name of family member notified:        PHYSICIAN Please sign FL2     Additional Comment:    _______________________________________________ Gwynne EdingerBibbs, Cadyn Rodger, LCSW 07/08/2015, 6:00 PM

## 2015-07-08 NOTE — Progress Notes (Signed)
Inpatient Diabetes Program Recommendations  AACE/ADA: New Consensus Statement on Inpatient Glycemic Control (2015)  Target Ranges:  Prepandial:   less than 140 mg/dL      Peak postprandial:   less than 180 mg/dL (1-2 hours)      Critically ill patients:  140 - 180 mg/dL   Review of Glycemic Control:  Results for Cedric FishmanWRIGHT, Abrea R (MRN [id number]) as of 07/08/2015 15:18  Ref. Range 07/07/2015 07:43 07/07/2015 13:22 07/07/2015 17:08 07/07/2015 23:21 07/08/2015 08:53 07/08/2015 11:14  Glucose-Capillary Latest Ref Range: 65-99 mg/dL 657176 (H) 846216 (H) 962172 (H) 180 (H) 179 (H) 321 (H)   Outpatient Diabetes medications: Humalog 75/25 - 6-12 units tidwc, lantus 24 units at HS, Glipizide 10 mg daily Current orders for Inpatient glycemic control: lantus 12 units at HS and moderate and HS correction scales.  Inpatient Diabetes Program Recommendations:  Please consider increase in lantus to 15 units and add meal coverage of 3 units tidwc and continue correction as ordered.  Thank you Lenor CoffinAnn Dom Haverland, RN, MSN, CDE  Diabetes Inpatient Program Office: [phone] Pager: [phone] 8:00 am to 5:00 pm

## 2015-07-08 NOTE — Progress Notes (Signed)
Called neuro house doctor Dr. Roseanne RenoStewart for clarification of sleep deprivation  Length for prolonged EEG 11/14. Writer was advised to discontinue sleep deprivation and modify order to prolonged EEG monitor on 11/14

## 2015-07-08 NOTE — Progress Notes (Signed)
SLP Cancellation Note  Patient Details Name: Cedric FishmanDonnie R Buenaventura MRN: [id number] DOB: [birth year]   Cancelled treatment:       Reason Eval/Treat Not Completed: SLP screened, no needs identified, will sign off.   Riccardo DubinKristen Candee Hoon 07/08/2015, 2:24 PM

## 2015-07-08 NOTE — Progress Notes (Signed)
Occupational Therapy Evaluation Patient Details Name: Leah Small MRN: [id number] DOB: October 07, 1940 Today's Date: 07/08/2015    History of Present Illness Pt is a 74 yo female with baseline left-sided weakness which appeared to progress in upper and lower extremity today at around 1330. Additionally patient noted to have some slurred speech at that time. These deficits were noted by patient's son. Symptoms are constant but appear to be slowly improving per the son. Patient also some baseline confusion which seems to be unchanged.    Clinical Impression   Pt appears to be progressing. PTA, pt independent with ADL and mobility, including driving. Pt will benefit from rehab at SNF to facilitate return to living independently. Will follow acutely to address established goals    Follow Up Recommendations  Supervision/Assistance - 24 hour;SNF    Equipment Recommendations  3 in 1 bedside comode    Recommendations for Other Services       Precautions / Restrictions Precautions Precautions: Fall Restrictions Weight Bearing Restrictions: No      Mobility Bed Mobility Overal bed mobility: Needs Assistance Bed Mobility: Supine to Sit;Sit to Supine     Supine to sit: Supervision Sit to supine: Supervision   General bed mobility comments: HOB elevated, use of bed rail, increased time  Transfers Overall transfer level: Needs assistance Equipment used: 1 person hand held assist Transfers: Sit to/from Stand Sit to Stand: Min assist         General transfer comment: more difficulty with walking backwards    Balance Overall balance assessment: Needs assistance Sitting-balance support: Feet supported Sitting balance-Leahy Scale: Fair     Standing balance support: Bilateral upper extremity supported;During functional activity Standing balance-Leahy Scale: Poor (posterior bias at times) Standing balance comment: RW for support and pt's safety                             ADL Overall ADL's : Needs assistance/impaired Eating/Feeding: Set up   Grooming: Set up;Standing   Upper Body Bathing: Set up;Sitting   Lower Body Bathing: Minimal assistance   Upper Body Dressing : Minimal assistance   Lower Body Dressing: Minimal assistance   Toilet Transfer: Minimal assistance;Regular Toilet;Ambulation   Toileting- Clothing Manipulation and Hygiene: Minimal assistance       Functional mobility during ADLs: Minimal assistance       Vision Vision Assessment?: Yes Eye Alignment: Within Functional Limits Ocular Range of Motion: Within Functional Limits Alignment/Gaze Preference: Within Defined Limits Tracking/Visual Pursuits: Decreased smoothness of horizontal tracking;Decreased smoothness of vertical tracking Saccades: Decreased speed of saccadic movement Convergence: Within functional limits Visual Fields: No apparent deficits Additional Comments: son reports initial field cut but states it has resolved   Perception Perception Comments: will further assess   Praxis Praxis Praxis tested?: Within functional limits    Pertinent Vitals/Pain Pain Assessment: No/denies pain Faces Pain Scale: Hurts a little bit Pain Location: Lt LE w/ ambulation Pain Descriptors / Indicators: Tightness Pain Intervention(s): Limited activity within patient's tolerance;Monitored during session     Hand Dominance Right   Extremity/Trunk Assessment Upper Extremity Assessment Upper Extremity Assessment: LUE deficits/detail LUE Deficits / Details: AROM WFL. generalized weakness; decreased coordination. ? minimal limb apraxia LUE Sensation:  (25% deficit) LUE Coordination: decreased fine motor   Lower Extremity Assessment Lower Extremity Assessment: Defer to PT evaluation LLE Sensation: decreased light touch (25% deficit) LLE Coordination:  (trouble sequencing, shaky)   Cervical / Trunk Assessment Cervical / Trunk Assessment:  Normal   Communication  Communication Communication: Expressive difficulties (delayed response time)   Cognition Arousal/Alertness: Awake/alert Behavior During Therapy: WFL for tasks assessed/performed Overall Cognitive Status: Impaired/Different from baseline Area of Impairment: Problem solving             Problem Solving: Slow processing;Difficulty sequencing     General Comments       Exercises Exercises: Other exercises Other Exercises Other Exercises: gross coordinationa nd BUE integrated activity ideas given to pt/son   Shoulder Instructions      Home Living Family/patient expects to be discharged to:: Private residence Living Arrangements:  (son and dtr in law live with her but she's alone during day) Available Help at Discharge: Family;Available PRN/intermittently Type of Home: House Home Access: Ramped entrance     Home Layout: One level     Bathroom Shower/Tub: Producer, television/film/videoWalk-in shower   Bathroom Toilet: Standard Bathroom Accessibility: Yes   Home Equipment: Environmental consultantWalker - 2 wheels;Cane - single point;Shower seat;Grab bars - tub/shower          Prior Functioning/Environment Level of Independence: Independent        Comments: progressive decline in activity tolerance, was driving, performing adls, pt used to be able to be gone all day but recently can only be out for like 2 hours    OT Diagnosis: Generalized weakness;Cognitive deficits   OT Problem List: Decreased strength;Decreased activity tolerance;Impaired balance (sitting and/or standing);Impaired vision/perception;Decreased coordination;Decreased safety awareness;Impaired UE functional use   OT Treatment/Interventions: Self-care/ADL training;Therapeutic exercise;Neuromuscular education;Therapeutic activities;Cognitive remediation/compensation;Visual/perceptual remediation/compensation;Patient/family education;Balance training    OT Goals(Current goals can be found in the care plan section) Acute Rehab OT Goals Patient Stated Goal:  to return to being independent OT Goal Formulation: With patient/family Time For Goal Achievement: 07/22/15 Potential to Achieve Goals: Good  OT Frequency: Min 2X/week   Barriers to D/C: Decreased caregiver support (caregivers work during the day)          Co-evaluation              End of Session Equipment Utilized During Treatment: Stage managerGait belt Nurse Communication: Mobility status  Activity Tolerance: Patient tolerated treatment well Patient left: in bed;with family/visitor present;with call bell/phone within reach   Time: 1715-1740 OT Time Calculation (min): 25 min Charges:  OT General Charges $OT Visit: 1 Procedure OT Evaluation $Initial OT Evaluation Tier I: 1 Procedure OT Treatments $Self Care/Home Management : 8-22 mins G-Codes:    Symphonie Schneiderman,HILLARY 07/08/2015, 5:44 PM   [facility] Kayshaun Polanco, OTR/L  [phone] 07/08/2015

## 2015-07-08 NOTE — Progress Notes (Signed)
ANTICOAGULATION CONSULT NOTE - Follow Up Consult  Pharmacy Consult for Heparin  Indication: NSTEMI  Allergies  Allergen Reactions  . Vicodin [Hydrocodone-Acetaminophen] Nausea And Vomiting    Patient Measurements: Height: 5\' 3"  (160 cm) Weight: 147 lb 4.3 oz (66.8 kg) IBW/kg (Calculated) : 52.4  Vital Signs: Temp: 97.4 F (36.3 C) (11/14 1700) Temp Source: Oral (11/14 1700) BP: 115/78 mmHg (11/14 1700) Pulse Rate: 52 (11/14 1700)  Labs:  Recent Labs  07/06/15 1510 07/06/15 1531 07/06/15 2038 07/07/15 0149 07/07/15 0755 07/07/15 0920  07/08/15 0050 07/08/15 0940 07/08/15 1831  HGB 12.4 14.6  --   --  12.2  --   --  10.7*  --   --   HCT 37.9 43.0  --   --  37.8  --   --  33.2*  --   --   PLT 196  --   --   --  173  --   --  150  --   --   APTT 29  --   --   --   --   --   --   --   --   --   LABPROT 14.0  --   --   --   --   --   --   --   --   --   INR 1.06  --   --   --   --   --   --   --   --   --   HEPARINUNFRC  --   --   --   --  0.36  --   < > 0.26* 0.35 0.57  CREATININE 0.79 0.70  --   --   --  0.72  --  0.97  --   --   TROPONINI  --   --  0.89* 1.28*  --   --   --   --   --   --   < > = values in this interval not displayed.  Estimated Creatinine Clearance: 46 mL/min (by C-G formula based on Cr of 0.97).  Assessment: [age] YOF here with stroke-like event, had another neuro event this morning. Suspected to be seizures. On heparin for ACS- plans to continue for 48h (would be through 11/15 AM)- not a CABG/PCI candidate.  HL was sub-therapeutic and rate was increased early this morning. Level is now therapeutic x2  0.56 on drip rate 1050 uts/hr   Hgb dropped slightly, plts relatively stable. No bleeding noted.  Goal of Therapy:  Heparin level 0.3-0.5 units/ml Monitor platelets by anticoagulation protocol: Yes   Plan:  -continue heparin infusion at1050 units/hr -daily HL and CBC -follow for LOT on heparin- anticipate it will be stopped tomorrow  morning  Leota SauersLisa Yosselyn Tax Pharm.D. CPP, BCPS Clinical Pharmacist [phone] 07/08/2015 7:15 PM

## 2015-07-08 NOTE — Procedures (Signed)
ELECTROENCEPHALOGRAM REPORT  Date of Study: 07/08/2015  Patient's Name: Leah FishmanDonnie R Olmo MRN: [id number] Date of Birth: 1940/10/27  Indication: 74 y.o. female with history of coronary artery disease, S/P CABG, previous stroke with residual left hemiparesis, psoriasis, bilateral carotid endarterectomies, and diabetes mellitus, presenting with left facial weakness and language impairment. She did not receive IV t-PA due to minimal new deficits.   Medications:  acetaminophen (TYLENOL) suppository 650 mg  aspirin EC tablet 81 mg  atorvastatin (LIPITOR) tablet 40 mg  clopidogrel (PLAVIX) tablet 75 mg)  hydrALAZINE (APRESOLINE) injection 5-10 mg  insulin aspart (novoLOG) injection 0-15 Units  levETIRAcetam (KEPPRA XR) 24 hr tablet 500 mg  nitroGLYCERIN (NITROSTAT) SL tablet 0.4 mg    Technical Summary: This is a multichannel digital EEG recording, using the international 10-20 placement system with electrodes applied with paste and impedances below 5000 ohms.    Description: The EEG background is symmetric, with a well-developed posterior dominant rhythm of 9 Hz, which is reactive to eye opening and closing.  Diffuse beta activity is seen, with a bilateral frontal preponderance.  No focal or generalized abnormalities are seen.  No focal or generalized epileptiform discharges are seen.  Stage II sleep is seen, with normal and symmetric sleep patterns.  Hyperventilation and photic stimulation were not performed.  ECG revealed normal cardiac rate and rhythm.  Impression: This is a normal routine EEG of the awake and asleep states, without activating procedures.  A normal study does not rule out the possibility of a seizure disorder in this patient.  Tasnim Balentine R. Everlena CooperJaffe, DO

## 2015-07-08 NOTE — Progress Notes (Signed)
[facility] PROGRESS NOTE  Leah Small:[id number] DOB: 20-Jul-1941 DOA: 07/06/2015 PCP: Marguarite Arbour, MD  Length of stay: 2   Assessment/Plan: Active Problems:   Stroke (HCC)   Diabetes mellitus with complication (HCC)   Essential hypertension   HLD (hyperlipidemia)   Chest pain   Memory deficit   Confusional state   Brief summary 74 y.o. 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 At baseline patient has left arm and leg weakness/numbness residual from prior stroke but reportedly is high functioning. She denies associated HA, vertigo, double vision, difficulty swallowing, confusion, or vision impairment. Complains of mid sternal chest pain similar to her episodic angina. NIHSS 8. CT brain was personally reviewed and showed no acute abnormality. Stated that she takes aspirin + plavix daily.   Assessment and plan Possible TIA: Non-dominant secondary to small vessel disease. Would also condiser partial complex seizure as a possibility. Resultant Left sided weakness. MRI - No acute intracranial findings. MRA - 50-75% LEFT P1 stenosis  Carotid Doppler1-39% ICA stenosis. Bilateral CEAs appear patent  2D Echo - pending  LDL - 82  HgbA1c pending  VTE prophylaxis - subcutaneous heparin  Diet regular diet and thin liquids, continue   aspirin 81 mg daily and clopidogrel 75 mg daily per neurology  PT OT recommendations Pending  Neurology recommended EEG monitoring, started on Keppra 500 extended release daily . OP neuro appt scheduled with DR. Sherryll Burger at [facility] next week to assess for seizures.   Confusion spells EEG ordered by neurology to rule out complex partial seizures Started on Keppra    Hypertension . Hypotensive this morning in the 90s, - currently not on  scheduled antihypertensive medications.Permissive hypertension (OK if < 220/120) but gradually normalize in 5-7 days   Hyperlipidemia  Increase Lipitor from 20-40 mg a day,LDL 82, goal < 70    Diabetes  HgbA1c pending, goal < 7.0 Uncontrolled. Continue SSI   NSTEMI in the setting of severe CAD- last cath showed occluded grafts but patent LIMA to LAD and patient was deemed not to be a redo CABG or PCI candidate. - continue ASA/Plavix/statin - BB and long acting nitrate held due to low BP.- CP free at this time .- continue IV Heparin gtt for a total of 48 hours, follow 2 D echo to assess LVF - continue to cycle enzymes until they peak Anticipate discharge tomorrow from a cardiac standpoint    DVT prophylaxsis  heparin drip   Code Status:      Code Status Orders        Start     Ordered   07/06/15 1757  Full code   Continuous     07/06/15 1804    Advance Directive Documentation        Most Recent Value   Type of Advance Directive  Healthcare Power of Attorney, Living will   Pre-existing out of facility DNR order (yellow form or pink MOST form)     "MOST" Form in Place?       Family Communication: family updated about patient's clinical progress Disposition Plan:  Anticipate discharge tomorrow     Consultants:  Neurology  Cardiology  Procedures:  None  Antibiotics: Anti-infectives    None         HPI/Subjective: No chest pain this  morning  Objective: Filed Vitals:   07/08/15 0500 07/08/15 0700 07/08/15 0733 07/08/15 0801  BP: 122/59 119/61 198/108 166/90  Pulse: 56 55  63  Temp:    98.1 F (36.7 C)  TempSrc:    Oral  Resp: 12 19  19   Height:      Weight:      SpO2: 97% 97%  97%    Intake/Output Summary (Last 24 hours) at 07/08/15 1151 Last data filed at 07/08/15 0801  Gross per 24 hour  Intake 517.35 ml  Output   1800 ml  Net -1282.65 ml    Exam: General: Well developed, well nourished, in no acute distress Head: Eyes PERRLA, No  xanthomas. Normal cephalic and atramatic Lungs: Clear bilaterally to auscultation and percussion. Heart: HRRR S1 S2 Pulses are 2+ & equal. Abdomen: Bowel sounds are positive, abdomen soft and non-tender without masses  Extremities: No clubbing, cyanosis or edema. DP +1 Neuro: Alert and oriented X 3. Psych: Good affect, responds appropriately   Data Review   Micro Results Recent Results (from the past 240 hour(s))  MRSA PCR Screening     Status: None   Collection Time: 07/06/15  6:30 PM  Result Value Ref Range Status   MRSA by PCR NEGATIVE NEGATIVE Final    Comment:        The GeneXpert MRSA Assay (FDA approved for NASAL specimens only), is one component of a comprehensive MRSA colonization surveillance program. It is not intended to diagnose MRSA infection nor to guide or monitor treatment for MRSA infections.     Radiology Reports Ct Head Wo Contrast  07/06/2015  CLINICAL DATA:  CODE STROKE, DR. Leroy Kennedy [phone], LEFT SIDE WEAKNESS, last seen normal at 1400, HX CVA. EXAM: CT HEAD WITHOUT CONTRAST TECHNIQUE: Contiguous axial images were obtained from the base of the skull through the vertex without intravenous contrast. COMPARISON:  MRI 02/06/2011, CT HEAD 08/09/2010 FINDINGS: There is central and cortical atrophy. There is no intra or extra-axial fluid collection or mass lesion. The basilar cisterns and ventricles have a normal appearance. There is no CT evidence for acute infarction or hemorrhage. Bone windows show mild mucoperiosteal thickening of the ethmoid air cells. No acute calvarial injury. Mastoid air cells are normally aerated. IMPRESSION: 1. Atrophy. 2.  No evidence for acute intracranial abnormality. 3. Mild chronic sinusitis. Critical Value/emergent results were called by telephone at the time of interpretation on 07/06/2015 at 3:41 pm to Dr. Leroy Kennedy, who verbally acknowledged these results. Electronically Signed   By: Norva Pavlov M.D.   On:  07/06/2015 15:41   Mr Leah Small Wo Contrast  07/06/2015  CLINICAL DATA:  74 y.o. female brought in with acute onset left face weakness and word finding difficulty. Suspect right subcortical infarct. NIHSS 8, but her deficits are old except for speech impediment and mild left face weakness, and thus did not treat her with thrombolytic therapy or endovascular intervention.Admit to medicine and complete stroke work up. She is already on dual antiplatelet therapy and will continue this pending results stroke work up. EXAM: MRI HEAD WITHOUT CONTRAST MRA HEAD WITHOUT CONTRAST TECHNIQUE: Multiplanar, multiecho pulse sequences of the brain and surrounding structures were obtained without intravenous contrast. Angiographic images of the head were obtained using MRA technique without contrast. COMPARISON:  CT head earlier today FINDINGS: MRI HEAD FINDINGS No evidence for acute infarction, hemorrhage, mass lesion, hydrocephalus, or extra-axial fluid. Generalized atrophy. Chronic microvascular ischemic change. No midline abnormality. Flow voids are maintained. Extracranial soft tissues unremarkable.  Mild chronic sinus disease. BILATERAL cataract extraction. Similar appearance to earlier CT. MRA HEAD FINDINGS Dolichoectatic but widely patent internal carotid arteries. Widely patent basilar artery with vertebrals codominant. No A1 or M1 stenosis of significance. Focal narrowing proximal LEFT P1 PCA segment 50-75% stenosis. Uncertain significance given the lack of acute or chronic LEFT occipital infarction. No cerebellar branch occlusion. No visible saccular aneurysm. IMPRESSION: Atrophy and small vessel disease.  No acute intracranial findings. 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 Electronically Signed   By: Elsie Stain M.D.   On: 07/06/2015 18:42   Mr Brain Wo Contrast  07/06/2015  CLINICAL DATA:  74 y.o. female brought in with acute onset left face weakness and word finding  difficulty. Suspect right subcortical infarct. NIHSS 8, but her deficits are old except for speech impediment and mild left face weakness, and thus did not treat her with thrombolytic therapy or endovascular intervention.Admit to medicine and complete stroke work up. She is already on dual antiplatelet therapy and will continue this pending results stroke work up. EXAM: MRI HEAD WITHOUT CONTRAST MRA HEAD WITHOUT CONTRAST TECHNIQUE: Multiplanar, multiecho pulse sequences of the brain and surrounding structures were obtained without intravenous contrast. Angiographic images of the head were obtained using MRA technique without contrast. COMPARISON:  CT head earlier today FINDINGS: MRI HEAD FINDINGS No evidence for acute infarction, hemorrhage, mass lesion, hydrocephalus, or extra-axial fluid. Generalized atrophy. Chronic microvascular ischemic change. No midline abnormality. Flow voids are maintained. Extracranial soft tissues unremarkable. Mild chronic sinus disease. BILATERAL cataract extraction. Similar appearance to earlier CT. MRA HEAD FINDINGS Dolichoectatic but widely patent internal carotid arteries. Widely patent basilar artery with vertebrals codominant. No A1 or M1 stenosis of significance. Focal narrowing proximal LEFT P1 PCA segment 50-75% stenosis. Uncertain significance given the lack of acute or chronic LEFT occipital infarction. No cerebellar branch occlusion. No visible saccular aneurysm. IMPRESSION: Atrophy and small vessel disease.  No acute intracranial findings. 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 Electronically Signed   By: Elsie Stain M.D.   On: 07/06/2015 18:42     CBC  Recent Labs Lab 07/06/15 1510 07/06/15 1531 07/07/15 0755 07/08/15 0050  WBC 5.5  --  5.2 5.2  HGB 12.4 14.6 12.2 10.7*  HCT 37.9 43.0 37.8 33.2*  PLT 196  --  173 150  MCV 92.9  --  94.5 93.8  MCH 30.4  --  30.5 30.2  MCHC 32.7  --  32.3 32.2  RDW 13.2  --  13.4 13.1   LYMPHSABS 1.9  --   --   --   MONOABS 0.5  --   --   --   EOSABS 0.1  --   --   --   BASOSABS 0.0  --   --   --     Chemistries   Recent Labs Lab 07/06/15 1510 07/06/15 1531 07/07/15 0920 07/08/15 0050  NA 137 139 137 137  K 4.3 4.3 4.2 4.6  CL 100* 99* 103 104  CO2 24  --  26 26  GLUCOSE 286* 284* 191* 230*  BUN CREATININE 0.79 0.70 0.72 0.97  CALCIUM 10.0  --  9.2 9.1  AST 34  --  25 19  ALT 23  --  18 17  ALKPHOS 57  --  52 44  BILITOT 0.5  --  0.7 0.4   ------------------------------------------------------------------------------------------------------------------ estimated creatinine clearance is 46 mL/min (by C-G formula based on  Cr of 0.97). ------------------------------------------------------------------------------------------------------------------  Recent Labs  07/06/15 1716  HGBA1C 7.7*   ------------------------------------------------------------------------------------------------------------------  Recent Labs  07/07/15 0149  CHOL 158  HDL 43  LDLCALC 82  TRIG 167*  CHOLHDL 3.7   ------------------------------------------------------------------------------------------------------------------  Recent Labs  07/07/15 1534  TSH 2.915   ------------------------------------------------------------------------------------------------------------------ No results for input(s): VITAMINB12, FOLATE, FERRITIN, TIBC, IRON, RETICCTPCT in the last 72 hours.  Coagulation profile  Recent Labs Lab 07/06/15 1510  INR 1.06    No results for input(s): DDIMER in the last 72 hours.  Cardiac Enzymes  Recent Labs Lab 07/06/15 2038 07/07/15 0149  TROPONINI 0.89* 1.28*   ------------------------------------------------------------------------------------------------------------------ Invalid input(s): POCBNP   CBG:  Recent Labs Lab 07/07/15 0743 07/07/15 1322 07/07/15 1708 07/07/15 2321 07/08/15 0853  GLUCAP 176* 216*  172* 180* 179*       Studies: Ct Head Wo Contrast  07/06/2015  CLINICAL DATA:  CODE STROKE, DR. Leroy KennedyAMILO [phone], LEFT SIDE WEAKNESS, last seen normal at 1400, HX CVA. EXAM: CT HEAD WITHOUT CONTRAST TECHNIQUE: Contiguous axial images were obtained from the base of the skull through the vertex without intravenous contrast. COMPARISON:  MRI 02/06/2011, CT HEAD 08/09/2010 FINDINGS: There is central and cortical atrophy. There is no intra or extra-axial fluid collection or mass lesion. The basilar cisterns and ventricles have a normal appearance. There is no CT evidence for acute infarction or hemorrhage. Bone windows show mild mucoperiosteal thickening of the ethmoid air cells. No acute calvarial injury. Mastoid air cells are normally aerated. IMPRESSION: 1. Atrophy. 2.  No evidence for acute intracranial abnormality. 3. Mild chronic sinusitis. Critical Value/emergent results were called by telephone at the time of interpretation on 07/06/2015 at 3:41 pm to Dr. Leroy Kennedyamilo, who verbally acknowledged these results. Electronically Signed   By: Norva PavlovElizabeth  Brown M.D.   On: 07/06/2015 15:41   Mr Leah LatchMra Head Wo Contrast  07/06/2015  CLINICAL DATA:  74 y.o. female brought in with acute onset left face weakness and word finding difficulty. Suspect right subcortical infarct. NIHSS 8, but her deficits are old except for speech impediment and mild left face weakness, and thus did not treat her with thrombolytic therapy or endovascular intervention.Admit to medicine and complete stroke work up. She is already on dual antiplatelet therapy and will continue this pending results stroke work up. EXAM: MRI HEAD WITHOUT CONTRAST MRA HEAD WITHOUT CONTRAST TECHNIQUE: Multiplanar, multiecho pulse sequences of the brain and surrounding structures were obtained without intravenous contrast. Angiographic images of the head were obtained using MRA technique without contrast. COMPARISON:  CT head earlier today FINDINGS: MRI HEAD  FINDINGS No evidence for acute infarction, hemorrhage, mass lesion, hydrocephalus, or extra-axial fluid. Generalized atrophy. Chronic microvascular ischemic change. No midline abnormality. Flow voids are maintained. Extracranial soft tissues unremarkable. Mild chronic sinus disease. BILATERAL cataract extraction. Similar appearance to earlier CT. MRA HEAD FINDINGS Dolichoectatic but widely patent internal carotid arteries. Widely patent basilar artery with vertebrals codominant. No A1 or M1 stenosis of significance. Focal narrowing proximal LEFT P1 PCA segment 50-75% stenosis. Uncertain significance given the lack of acute or chronic LEFT occipital infarction. No cerebellar branch occlusion. No visible saccular aneurysm. IMPRESSION: Atrophy and small vessel disease.  No acute intracranial findings. 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 Electronically Signed   By: Elsie StainJohn T Curnes M.D.   On: 07/06/2015 18:42   Mr Brain Wo Contrast  07/06/2015  CLINICAL DATA:  74 y.o. female brought in with acute onset left face weakness and word finding difficulty. Suspect  right subcortical infarct. NIHSS 8, but her deficits are old except for speech impediment and mild left face weakness, and thus did not treat her with thrombolytic therapy or endovascular intervention.Admit to medicine and complete stroke work up. She is already on dual antiplatelet therapy and will continue this pending results stroke work up. EXAM: MRI HEAD WITHOUT CONTRAST MRA HEAD WITHOUT CONTRAST TECHNIQUE: Multiplanar, multiecho pulse sequences of the brain and surrounding structures were obtained without intravenous contrast. Angiographic images of the head were obtained using MRA technique without contrast. COMPARISON:  CT head earlier today FINDINGS: MRI HEAD FINDINGS No evidence for acute infarction, hemorrhage, mass lesion, hydrocephalus, or extra-axial fluid. Generalized atrophy. Chronic microvascular ischemic change.  No midline abnormality. Flow voids are maintained. Extracranial soft tissues unremarkable. Mild chronic sinus disease. BILATERAL cataract extraction. Similar appearance to earlier CT. MRA HEAD FINDINGS Dolichoectatic but widely patent internal carotid arteries. Widely patent basilar artery with vertebrals codominant. No A1 or M1 stenosis of significance. Focal narrowing proximal LEFT P1 PCA segment 50-75% stenosis. Uncertain significance given the lack of acute or chronic LEFT occipital infarction. No cerebellar branch occlusion. No visible saccular aneurysm. IMPRESSION: Atrophy and small vessel disease.  No acute intracranial findings. 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 Electronically Signed   By: Elsie Stain M.D.   On: 07/06/2015 18:42      Lab Results  Component Value Date   HGBA1C 7.7* 07/06/2015   Lab Results  Component Value Date   LDLCALC 82 07/07/2015   CREATININE 0.97 07/08/2015       Scheduled Meds: . aspirin EC  81 mg Oral QHS  . atorvastatin  40 mg Oral QHS  . brimonidine  1 drop Both Eyes BID  . clopidogrel  75 mg Oral Daily  . famotidine  20 mg Oral Daily  . insulin aspart  0-15 Units Subcutaneous TID WC  . insulin aspart  0-5 Units Subcutaneous QHS  . insulin glargine  12 Units Subcutaneous QHS  . latanoprost  1 drop Both Eyes QHS  . levETIRAcetam  500 mg Oral Daily  . nitroGLYCERIN  0.4 mg Sublingual Once  . sodium chloride  500 mL Intravenous Once  . sodium chloride  3 mL Intravenous Q12H  . sodium chloride  3 mL Intravenous Q12H  . timolol  1 drop Both Eyes BID   Continuous Infusions: . heparin 1,050 Units/hr (07/08/15 0900)    Active Problems:   Stroke (HCC)   Diabetes mellitus with complication (HCC)   Essential hypertension   HLD (hyperlipidemia)   Chest pain   Memory deficit   Confusional state    Time spent: 45 minutes   [facility]  [facility] Pager [phone]. If 7PM-7AM, please contact  night-coverage at [email or web address], password [email or web address] 07/08/2015, 11:51 AM  LOS: 2 days

## 2015-07-09 ENCOUNTER — Encounter
Admission: RE | Admit: 2015-07-09 | Discharge: 2015-07-09 | Disposition: A | Discharge status: Home / Self Care | Payer: Medicare Other | Source: Ambulatory Visit | Attending: Internal Medicine | Admitting: Internal Medicine

## 2015-07-09 DIAGNOSIS — I25119 Atherosclerotic heart disease of native coronary artery with unspecified angina pectoris: Secondary | ICD-10-CM

## 2015-07-09 DIAGNOSIS — E119 Type 2 diabetes mellitus without complications: Secondary | ICD-10-CM | POA: Insufficient documentation

## 2015-07-09 LAB — CBC
HEMATOCRIT: 32 % — AB (ref 36.0–46.0)
HEMOGLOBIN: 10.1 g/dL — AB (ref 12.0–15.0)
MCH: 29.6 pg (ref 26.0–34.0)
MCHC: 31.6 g/dL (ref 30.0–36.0)
MCV: 93.8 fL (ref 78.0–100.0)
Platelets: 145 10*3/uL — ABNORMAL LOW (ref 150–400)
RBC: 3.41 MIL/uL — AB (ref 3.87–5.11)
RDW: 13.3 % (ref 11.5–15.5)
WBC: 5.1 10*3/uL (ref 4.0–10.5)

## 2015-07-09 LAB — FOLATE RBC
Folate, Hemolysate: 485.5 ng/mL
Folate, RBC: 1319 ng/mL (ref >498)
HEMATOCRIT: 36.8 % (ref 34.0–46.6)

## 2015-07-09 LAB — GLUCOSE, CAPILLARY
GLUCOSE-CAPILLARY: 198 mg/dL — AB (ref 65–99)
GLUCOSE-CAPILLARY: 347 mg/dL — AB (ref 65–99)
Glucose-Capillary: 265 mg/dL — ABNORMAL HIGH (ref 65–99)
Glucose-Capillary: 146 mg/dL — ABNORMAL HIGH (ref 65–99)

## 2015-07-09 LAB — HEPARIN LEVEL (UNFRACTIONATED): HEPARIN UNFRACTIONATED: 0.46 [IU]/mL (ref 0.30–0.70)

## 2015-07-09 LAB — TROPONIN I: Troponin I: 0.17 ng/mL — ABNORMAL HIGH (ref <0.031)

## 2015-07-09 MED ORDER — PANTOPRAZOLE SODIUM 40 MG IV SOLR
80.0000 mg | Freq: Once | INTRAVENOUS | Status: AC
  Administered 2015-07-09: 80 mg via INTRAVENOUS
  Filled 2015-07-09: qty 80

## 2015-07-09 MED ORDER — INSULIN ASPART 100 UNIT/ML ~~LOC~~ SOLN
3.0000 [IU] | Freq: Three times a day (TID) | SUBCUTANEOUS | Status: DC
  Administered 2015-07-09 – 2015-07-10 (×3): 3 [IU] via SUBCUTANEOUS

## 2015-07-09 MED ORDER — LEVETIRACETAM ER 500 MG PO TB24
500.0000 mg | ORAL_TABLET | Freq: Once | ORAL | Status: AC
  Administered 2015-07-09: 500 mg via ORAL
  Filled 2015-07-09: qty 1

## 2015-07-09 MED ORDER — LEVETIRACETAM ER 500 MG PO TB24
500.0000 mg | ORAL_TABLET | Freq: Every day | ORAL | Status: DC
  Administered 2015-07-10: 500 mg via ORAL
  Filled 2015-07-09: qty 1

## 2015-07-09 MED ORDER — SODIUM CHLORIDE 0.9 % IV BOLUS (SEPSIS)
250.0000 mL | Freq: Once | INTRAVENOUS | Status: AC
Start: 2015-07-09 — End: 2015-07-09
  Administered 2015-07-09: 250 mL via INTRAVENOUS

## 2015-07-09 MED ORDER — METOPROLOL TARTRATE 12.5 MG HALF TABLET
12.5000 mg | ORAL_TABLET | Freq: Two times a day (BID) | ORAL | Status: DC
  Filled 2015-07-09: qty 1

## 2015-07-09 MED ORDER — PANTOPRAZOLE SODIUM 40 MG PO TBEC
40.0000 mg | DELAYED_RELEASE_TABLET | Freq: Every day | ORAL | Status: DC
  Administered 2015-07-10: 40 mg via ORAL
  Filled 2015-07-09: qty 1

## 2015-07-09 MED ORDER — NITROGLYCERIN 0.2 MG/HR TD PT24
0.2000 mg | MEDICATED_PATCH | Freq: Every day | TRANSDERMAL | Status: DC
  Administered 2015-07-09: 0.2 mg via TRANSDERMAL
  Filled 2015-07-09 (×2): qty 1

## 2015-07-09 MED ORDER — ISOSORBIDE MONONITRATE ER 30 MG PO TB24
15.0000 mg | ORAL_TABLET | Freq: Every day | ORAL | Status: DC
  Administered 2015-07-09: 15 mg via ORAL
  Filled 2015-07-09 (×3): qty 1

## 2015-07-09 MED ORDER — INSULIN GLARGINE 100 UNIT/ML ~~LOC~~ SOLN
15.0000 [IU] | Freq: Every day | SUBCUTANEOUS | Status: DC
  Administered 2015-07-09: 15 [IU] via SUBCUTANEOUS
  Filled 2015-07-09 (×2): qty 0.15

## 2015-07-09 MED ORDER — LEVETIRACETAM ER 500 MG PO TB24: 1000.0000 mg | ORAL_TABLET | Freq: Every day | ORAL | Status: DC

## 2015-07-09 NOTE — Progress Notes (Signed)
STROKE TEAM PROGRESS NOTE   SUBJECTIVE (INTERVAL HISTORY) Patient had another episode of transient heart 11 minutes and speech difficulty but it was much more milder than her previous ones. She has had only one dose of Keppra so far. EEG was normal   OBJECTIVE Temp:  [97.4 F (36.3 C)-97.5 F (36.4 C)] 97.4 F (36.3 C) (11/15 1202) Pulse Rate:  [47-64] 50 (11/15 1305) Cardiac Rhythm:  [-] Normal sinus rhythm (11/15 0736) Resp:  [15-23] 15 (11/15 0736) BP: (76-175)/(33-78) 84/43 mmHg (11/15 1305) SpO2:  [96 %-100 %] 100 % (11/15 0736)  CBC:  Recent Labs Lab 07/06/15 1510  07/08/15 0050 07/09/15 0405  WBC 5.5  < > 5.2 5.1  NEUTROABS 3.0  --   --   --   HGB 12.4  < > 10.7* 10.1*  HCT 37.9  < > 33.2* 32.0*  MCV 92.9  < > 93.8 93.8  PLT 196  < > 150 145*  < > = values in this interval not displayed.  Basic Metabolic Panel:   Recent Labs Lab 07/07/15 0920 07/08/15 0050  NA 137 137  K 4.2 4.6  CL 103 104  CO2 26 26  GLUCOSE 191* 230*  BUN 10 14  CREATININE 0.72 0.97  CALCIUM 9.2 9.1    Lipid Panel:     Component Value Date/Time   CHOL 158 07/07/2015 0149   TRIG 167* 07/07/2015 0149   HDL 43 07/07/2015 0149   CHOLHDL 3.7 07/07/2015 0149   VLDL 33 07/07/2015 0149   LDLCALC 82 07/07/2015 0149   HgbA1c:  Lab Results  Component Value Date   HGBA1C 7.7* 07/06/2015   Urine Drug Screen:     Component Value Date/Time   LABOPIA NONE DETECTED 07/06/2015 1558   COCAINSCRNUR NONE DETECTED 07/06/2015 1558   LABBENZ NONE DETECTED 07/06/2015 1558   AMPHETMU NONE DETECTED 07/06/2015 1558   THCU NONE DETECTED 07/06/2015 1558   LABBARB NONE DETECTED 07/06/2015 1558      IMAGING  Ct Head Wo Contrast 07/06/2015   1. Atrophy.  2. No evidence for acute intracranial abnormality.  3. Mild chronic sinusitis.   MRI HEAD FINDINGS  07/06/2015   No evidence for acute infarction, hemorrhage, mass lesion, hydrocephalus, or extra-axial fluid. Generalized atrophy. Chronic  microvascular ischemic change. No midline abnormality. Flow voids are maintained. Extracranial soft tissues unremarkable. Mild chronic sinus disease. BILATERAL cataract extraction. Similar appearance to earlier CT.   MRA HEAD FINDINGS  07/06/2015  Dolichoectatic but widely patent internal carotid arteries. Widely patent basilar artery with vertebrals codominant. No A1 or M1 stenosis of significance. Focal narrowing proximal LEFT P1 PCA segment 50-75% stenosis. Uncertain significance given the lack of acute or chronic LEFT occipital infarction. No cerebellar branch occlusion. No visible saccular aneurysm. IMPRESSION:  Atrophy and small vessel disease.  No acute intracranial findings. 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   2D Echocardiogram  - Left ventricle: The cavity size was normal. Wall thickness wasnormal. Systolic function was normal. The estimated ejectionfraction was in the range of 55% to 60%. - Mitral valve: Calcified annulus. Mildly thickened leaflets.There was mild regurgitation. - Left atrium: The atrium was mildly dilated. - Tricuspid valve: There was moderate regurgitation. - Pulmonary arteries: PA peak pressure: 44 mm Hg (S).  Carotid Doppler   1-39% ICA stenosis. Bilateral CEAs appear patent. Vertebral artery flow is antegrade.      PHYSICAL EXAM General - Well nourished, well developed middle aged Caucasian lady, in  no  acute distress   Cardiovascular - Regular rate and rhythm Pulmonary: CTA Abdomen: NT, ND, normal bowel sounds Extremities: No C/C/E  Neurological Exam Mental Status: Normal Orientation:  Oriented to person, place and time Speech:  Fluent; no dysarthria Cranial Nerves:  PERRL; EOMI; visual fields full, face grossly symmetric, hearing grossly intact; shrug symmetric and tongue midline Motor Exam:  Tone:  Within normal limits; Strength: 5/5 on the right; 4/5 on the left (chronic) Sensory: Intact to light touch  throughout the right side but decreased on the left (chronic) Coordination:  Intact finger to nose Gait: Deferred   ASSESSMENT/PLAN Leah Small is a 74 y.o. female with history of coronary artery disease, S/P CABG, previous stroke with residual left hemiparesis, psoriasis, bilateral carotid endarterectomies, and diabetes mellitus, presenting with left facial weakness and language impairment. She did not receive IV t-PA due to minimal new deficits.   Partial complex seizure. Doubt TIA   Lethargic after onset  Recurrent similar events follow stressful situations   Recurrent episode this am  Resultant  Left sided weakness resolved  Prolonged EEG monitoring ordered. Change to routine EEG  Start Keppra 500 er daily  MRI - No acute intracranial findings.  MRA - 50-75% LEFT P1 stenosis  Carotid Doppler No significant stenosis   2D Echo No source of embolus   LDL - 82  HgbA1c pending   VTE prophylaxis - full dose heparin Diet Carb Modified Fluid consistency:: Thin; Room service appropriate?: Yes  aspirin 81 mg daily and clopidogrel 75 mg daily prior to admission, now on aspirin 81 mg daily, clopidogrel 75 mg daily and heparin IV. On IV heparin per cardiology for 48 h given STEMI.  No indication for full dose IV heparin from stroke standpoint after that time frame  Ongoing aggressive stroke risk factor management  Has OP neuro appt scheduled with DR. Sherryll Burger at [facility] next week to assess for seizures. Keep appt and follow up with hime  Therapy recommendations: pending   Disposition: anticipate return home  Hypertension  Mildly low at times - currently not on scheduled antihypertensive medications.  Hyperlipidemia  Home meds:  Lipitor 20 mg daily  LDL 82, goal < 70  Lipitor increased to 40 mg daily  Continue statin at discharge  Diabetes  HgbA1c pending , goal < 7.0  Uncontrolled  Other Stroke Risk Factors  Advanced age  Hx  stroke/TIA  Family hx stroke (Mother)  Coronary artery disease  NSTEMI  In setting of severe CAD  Ganji tis am - continue Heparin x 48h, No further workup  Other Active Problems  Recent appt w/  ophthalmologist just told her she has significantly increased pressures in both eyes  Memory challenges.  Will send reversible causes of dementia labs pending   Hospital day # 3  Leah Small  [facility] See Amion for Pager information 07/09/2015 1:45 PM  I have personally examined this patient, reviewed notes, independently viewed imaging studies, participated in medical decision making and plan of care. I have made any additions or clarifications directly to the above note. Agree with note above. . 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 She has had previous strokes but recurrent stereotypical nature of the episodes would favor seizures over TIAs. Recommend Keppra XR 500 mg daily . Follow-up as an outpatient in clinic in 2 months.. Long discussion at the bedside with the patient and son and answered questions.  Stroke team will sign off. Kindly call for questions. Delia Heady, MD Medical Director [facility] Pager: [phone] 07/09/2015 1:45 PM  To contact Stroke Continuity provider, please refer to [email or web address]. After hours, contact General Neurology

## 2015-07-09 NOTE — Progress Notes (Signed)
Inpatient Diabetes Program Recommendations  AACE/ADA: New Consensus Statement on Inpatient Glycemic Control (2015)  Target Ranges:  Prepandial:   less than 140 mg/dL      Peak postprandial:   less than 180 mg/dL (1-2 hours)      Critically ill patients:  140 - 180 mg/dL    Results for Cedric FishmanWRIGHT, Babette R (MRN [id number]) as of 07/09/2015 10:59  Ref. Range 07/08/2015 08:53 07/08/2015 11:14 07/08/2015 17:01 07/08/2015 21:16  Glucose-Capillary Latest Ref Range: 65-99 mg/dL 409179 (H) 811321 (H) 914175 (H) 201 (H)    Results for Cedric FishmanWRIGHT, Theresea R (MRN [id number]) as of 07/09/2015 10:59  Ref. Range 07/09/2015 08:51  Glucose-Capillary Latest Ref Range: 65-99 mg/dL 086198 (H)    Home DM Meds: Lantus 24 units QHS       Humalog 75/25 insulin- 6 units QID + SSI       Glipizide 10 mg daily       Metformin 1000 mg bid  Current Insulin Orders: Lantus 12 units QHS      Novolog Moderate SSI (0-15 units) TID AC + HS      MD- Please consider the following in-hospital insulin adjustments:  1. Increase Lantus to 15 units QHS  2. Start Novolog Meal Coverage- Novolog 3 units tid with meals      --Will follow patient during hospitalization--  Ambrose FinlandJeannine Johnston Rut Betterton RN, MSN, CDE Diabetes Coordinator Inpatient Glycemic Control Team Team Pager: [phone] (8a-5p)

## 2015-07-09 NOTE — Progress Notes (Signed)
SUBJECTIVE:  Had another seizure episode earlier today. At the end of the seizure, she had some mild chest discomfort. It resolved. No other chest discomfort.  OBJECTIVE:   Vitals:   Filed Vitals:   07/09/15 0736 07/09/15 0820 07/09/15 0821 07/09/15 0822  BP: 137/78 175/69  166/73  Pulse: 59  63 64  Temp: 97.5 F (36.4 C)     TempSrc: Oral     Resp: 15     Height:      Weight:      SpO2: 100%      I&O's:   Intake/Output Summary (Last 24 hours) at 07/09/15 0951 Last data filed at 07/09/15 0700  Gross per 24 hour  Intake    873 ml  Output   1300 ml  Net   -427 ml   TELEMETRY: Reviewed telemetry pt in normal sinus rhythm:     PHYSICAL EXAM General: Well developed, well nourished, in no acute distress Head:   Normal cephalic and atramatic  Lungs:  Clear bilaterally to auscultation. Heart:  HRRR S1 S2  No JVD.   Abdomen: abdomen soft and non-tender Msk:  Back normal,  Normal strength and tone for age. Extremities:   No edema.   Neuro: Alert and oriented. Psych:  Normal affect, responds appropriately Skin: No rash   LABS: Basic Metabolic Panel:  Recent Labs  47/82/95 0920 07/08/15 0050  NA 137 137  K 4.2 4.6  CL 103 104  CO2 26 26  GLUCOSE 191* 230*  BUN 10 14  CREATININE 0.72 0.97  CALCIUM 9.2 9.1   Liver Function Tests:  Recent Labs  07/07/15 0920 07/08/15 0050  AST 25 19  ALT 18 17  ALKPHOS 52 44  BILITOT 0.7 0.4  PROT 6.8 5.8*  ALBUMIN 3.7 3.1*   No results for input(s): LIPASE, AMYLASE in the last 72 hours. CBC:  Recent Labs  07/06/15 1510  07/08/15 0050 07/09/15 0405  WBC 5.5  < > 5.2 5.1  NEUTROABS 3.0  --   --   --   HGB 12.4  < > 10.7* 10.1*  HCT 37.9  < > 33.2* 32.0*  MCV 92.9  < > 93.8 93.8  PLT 196  < > 150 145*  < > = values in this interval not displayed. Cardiac Enzymes:  Recent Labs  07/06/15 2038 07/07/15 0149  TROPONINI 0.89* 1.28*   BNP: Invalid input(s): POCBNP D-Dimer: No results for input(s): DDIMER  in the last 72 hours. Hemoglobin A1C:  Recent Labs  07/06/15 1716  HGBA1C 7.7*   Fasting Lipid Panel:  Recent Labs  07/07/15 0149  CHOL 158  HDL 43  LDLCALC 82  TRIG 167*  CHOLHDL 3.7   Thyroid Function Tests:  Recent Labs  07/07/15 1534  TSH 2.915   Anemia Panel:  Recent Labs  07/08/15 0940  VITAMINB12 310   Coag Panel:   Lab Results  Component Value Date   INR 1.06 07/06/2015   INR 0.9 09/16/2011    RADIOLOGY: Ct Head Wo Contrast  07/06/2015  CLINICAL DATA:  CODE STROKE, DR. Leroy Kennedy [phone], LEFT SIDE WEAKNESS, last seen normal at 1400, HX CVA. EXAM: CT HEAD WITHOUT CONTRAST TECHNIQUE: Contiguous axial images were obtained from the base of the skull through the vertex without intravenous contrast. COMPARISON:  MRI 02/06/2011, CT HEAD 08/09/2010 FINDINGS: There is central and cortical atrophy. There is no intra or extra-axial fluid collection or mass lesion. The basilar cisterns and ventricles have a normal appearance. There  is no CT evidence for acute infarction or hemorrhage. Bone windows show mild mucoperiosteal thickening of the ethmoid air cells. No acute calvarial injury. Mastoid air cells are normally aerated. IMPRESSION: 1. Atrophy. 2.  No evidence for acute intracranial abnormality. 3. Mild chronic sinusitis. Critical Value/emergent results were called by telephone at the time of interpretation on 07/06/2015 at 3:41 pm to Dr. Leroy Kennedy, who verbally acknowledged these results. Electronically Signed   By: Norva Pavlov M.D.   On: 07/06/2015 15:41   Mr [name] Wo Contrast  07/06/2015  CLINICAL DATA:  74 y.o. female brought in with acute onset left face weakness and word finding difficulty. Suspect right subcortical infarct. NIHSS 8, but her deficits are old except for speech impediment and mild left face weakness, and thus did not treat her with thrombolytic therapy or endovascular intervention.Admit to medicine and complete stroke work up. She is already on  dual antiplatelet therapy and will continue this pending results stroke work up. EXAM: MRI HEAD WITHOUT CONTRAST MRA HEAD WITHOUT CONTRAST TECHNIQUE: Multiplanar, multiecho pulse sequences of the brain and surrounding structures were obtained without intravenous contrast. Angiographic images of the head were obtained using MRA technique without contrast. COMPARISON:  CT head earlier today FINDINGS: MRI HEAD FINDINGS No evidence for acute infarction, hemorrhage, mass lesion, hydrocephalus, or extra-axial fluid. Generalized atrophy. Chronic microvascular ischemic change. No midline abnormality. Flow voids are maintained. Extracranial soft tissues unremarkable. Mild chronic sinus disease. BILATERAL cataract extraction. Similar appearance to earlier CT. MRA HEAD FINDINGS Dolichoectatic but widely patent internal carotid arteries. Widely patent basilar artery with vertebrals codominant. No A1 or M1 stenosis of significance. Focal narrowing proximal LEFT P1 PCA segment 50-75% stenosis. Uncertain significance given the lack of acute or chronic LEFT occipital infarction. No cerebellar branch occlusion. No visible saccular aneurysm. IMPRESSION: Atrophy and small vessel disease.  No acute intracranial findings. 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 Electronically Signed   By: Elsie Stain M.D.   On: 07/06/2015 18:42   Mr Brain Wo Contrast  07/06/2015  CLINICAL DATA:  74 y.o. female brought in with acute onset left face weakness and word finding difficulty. Suspect right subcortical infarct. NIHSS 8, but her deficits are old except for speech impediment and mild left face weakness, and thus did not treat her with thrombolytic therapy or endovascular intervention.Admit to medicine and complete stroke work up. She is already on dual antiplatelet therapy and will continue this pending results stroke work up. EXAM: MRI HEAD WITHOUT CONTRAST MRA HEAD WITHOUT CONTRAST TECHNIQUE: Multiplanar,  multiecho pulse sequences of the brain and surrounding structures were obtained without intravenous contrast. Angiographic images of the head were obtained using MRA technique without contrast. COMPARISON:  CT head earlier today FINDINGS: MRI HEAD FINDINGS No evidence for acute infarction, hemorrhage, mass lesion, hydrocephalus, or extra-axial fluid. Generalized atrophy. Chronic microvascular ischemic change. No midline abnormality. Flow voids are maintained. Extracranial soft tissues unremarkable. Mild chronic sinus disease. BILATERAL cataract extraction. Similar appearance to earlier CT. MRA HEAD FINDINGS Dolichoectatic but widely patent internal carotid arteries. Widely patent basilar artery with vertebrals codominant. No A1 or M1 stenosis of significance. Focal narrowing proximal LEFT P1 PCA segment 50-75% stenosis. Uncertain significance given the lack of acute or chronic LEFT occipital infarction. No cerebellar branch occlusion. No visible saccular aneurysm. IMPRESSION: Atrophy and small vessel disease.  No acute intracranial findings. 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 Electronically Signed   By: Dale Ross.D.  On: 07/06/2015 18:42      ASSESSMENT: Roosvelt Harps/PLAN:  NSTEMI in the setting of severe CAD.  Stable from a cardiac standpoint. Mild  Angina with stress of seizure. Will restart NTG patch. Would resume home dose at discharge. BP was too low initially but better now. Will try to get records of last cath from Dr. Gwen PoundsKowalski. Medical therapy of NSTEMI. Heparin has been running for 48 hours. Will stop at this time.  Would not plan on repeat cath at this time given that she had no PCI targets in the past. Primary issues appears to be neurologic at this time.   Corky CraftsJayadeep S Alesi Zachery, MD  07/09/2015  9:51 AM

## 2015-07-09 NOTE — Progress Notes (Addendum)
Nitroglycerin patch was restarted today per Dr. Eldridge DaceVaranasi. SBP has since dropped as low 76/33. Patch removed. Dr. Eldridge DaceVaranasi paged. Per Dr. Eldridge DaceVaranasi ok to leave nitroglycerin patch off for now. HR also decreased from low 60s to upper 40s, 47-49. Dr. Eldridge DaceVaranasi made aware, will cont to monitor for now.

## 2015-07-09 NOTE — Progress Notes (Signed)
Physical Therapy Treatment Patient Details Name: Leah Small MRN: [id number] DOB: [birth year] Today's Date: 07/09/2015    History of Present Illness Pt is a 74 yo female with baseline left-sided weakness which appeared to progress in upper and lower extremity today at around 1330. Additionally patient noted to have some slurred speech at that time. These deficits were noted by patient's son. Symptoms are constant but appear to be slowly improving per the son. Patient also some baseline confusion which seems to be unchanged. Pt w/ stroke like episode mornign of 11/14 w/ Lt side facial droop, Lt side gaze, and inability to speak which resolved as day progressed.  Normal EEG.     PT Comments    Ms. Pawling more limited by fatigue this session, requiring 3 standing rest breaks ambulating 80 ft in hallway.  Pt will benefit from continued skilled PT services to increase functional independence and safety.   Follow Up Recommendations  SNF;Supervision/Assistance - 24 hour     Equipment Recommendations  None recommended by PT    Recommendations for Other Services       Precautions / Restrictions Precautions Precautions: Fall Restrictions Weight Bearing Restrictions: No    Mobility  Bed Mobility Overal bed mobility: Needs Assistance Bed Mobility: Supine to Sit     Supine to sit: Min guard     General bed mobility comments: HOB elevated, use of bed rail, increased time  Transfers Overall transfer level: Needs assistance Equipment used: Rolling walker (2 wheeled) Transfers: Sit to/from Stand Sit to Stand: Min assist         General transfer comment: Min assist stabilizing RW.  Pt performed sit<>stand w/ safe hand placement  Ambulation/Gait Ambulation/Gait assistance: Min guard Ambulation Distance (Feet): 80 Feet Assistive device: Rolling walker (2 wheeled) Gait Pattern/deviations: Step-through pattern;Decreased dorsiflexion - left;Decreased stride length;Trunk  flexed Gait velocity: slow Gait velocity interpretation: <1.8 ft/sec, indicative of risk for recurrent falls General Gait Details: Pt required standing rest break x3 2/2 generalized fatigue.  Min guard assist as pt fatigues.   Stairs            Wheelchair Mobility    Modified Rankin (Stroke Patients Only) Modified Rankin (Stroke Patients Only) Pre-Morbid Rankin Score: Slight disability Modified Rankin: Moderate disability     Balance Overall balance assessment: Needs assistance Sitting-balance support: Feet supported Sitting balance-Leahy Scale: Good     Standing balance support: Bilateral upper extremity supported;During functional activity Standing balance-Leahy Scale: Poor Standing balance comment: RW for support and pt's safety                    Cognition Arousal/Alertness: Awake/alert Behavior During Therapy: WFL for tasks assessed/performed Overall Cognitive Status: Impaired/Different from baseline Area of Impairment: Problem solving             Problem Solving: Slow processing      Exercises General Exercises - Lower Extremity Ankle Circles/Pumps: AROM;Both;15 reps;Seated Long Arc Quad: AROM;Both;10 reps;Seated Hip Flexion/Marching: AROM;Both;10 reps;Seated    General Comments        Pertinent Vitals/Pain Pain Assessment: No/denies pain Faces Pain Scale: No hurt    Home Living                      Prior Function            PT Goals (current goals can now be found in the care plan section) Acute Rehab PT Goals Patient Stated Goal: to eventually get back to my normal  routine PT Goal Formulation: With patient/family Time For Goal Achievement: 07/14/15 Potential to Achieve Goals: Good Progress towards PT goals: Progressing toward goals    Frequency  Min 4X/week    PT Plan Current plan remains appropriate    Co-evaluation             End of Session Equipment Utilized During Treatment: Gait belt Activity  Tolerance: Patient limited by fatigue Patient left: with call bell/phone within reach;with family/visitor present;in bed;with bed alarm set (sitting EOB)     Time: [id number] PT Time Calculation (min) (ACUTE ONLY): 14 min  Charges:  $Gait Training: 8-22 mins                    G Codes:      Michail JewelsAshley Parr PT, TennesseeDPT [phone] Pager: [phone] 07/09/2015, 11:49 AM

## 2015-07-09 NOTE — Progress Notes (Addendum)
[facility] PROGRESS NOTE  Leah FishmanDonnie R Small ZOX:[id number] DOB: 12-14-1940 DOA: 07/06/2015 PCP: Marguarite ArbourSPARKS,JEFFREY D, MD  Length of stay: 3   Assessment/Plan: Active Problems:   Stroke (HCC)   Diabetes mellitus with complication (HCC)   Essential hypertension   HLD (hyperlipidemia)   Chest pain   Memory deficit   Confusional state   Brief summary 74 y.o. 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 At baseline patient has left arm and leg weakness/numbness residual from prior stroke but reportedly is high functioning. She denies associated HA, vertigo, double vision, difficulty swallowing, confusion, or vision impairment. Complains of mid sternal chest pain similar to her episodic angina. NIHSS 8. CT brain was personally reviewed and showed no acute abnormality. Stated that she takes aspirin + plavix daily.   Assessment and plan Possible TIA: Non-dominant secondary to Small vessel disease. Would also condiser partial complex seizure as a possibility. Resultant Left sided weakness. MRI - No acute intracranial findings. MRA - 50-75% LEFT P1 stenosis  Carotid Doppler1-39% ICA stenosis. Bilateral CEAs appear patent  2D Echo - pending  LDL - 82  HgbA1c pending  VTE prophylaxis - subcutaneous heparin  Diet regular diet and thin liquids, continue   aspirin 81 mg daily and clopidogrel 75 mg daily per neurology  PT OT recommendations Pending  Neurology recommended EEG monitoring, continue Keppra 500 extended release daily . Neurology expects symptoms to improve over time. OP neuro [facility] to assess for seizures.   Confusion spells EEG ordered by neurology to rule out complex partial seizures Continue  Keppra    500 mg  extended-release    Hypertension . Hypertensive this morning  , - currently not on scheduled antihypertensive medications.Permissive hypertension (OK if < 220/120) but gradually normalize in 5-7 days  Restart metoprolol and imdur    Hyperlipidemia  Increase Lipitor from 20-40 mg a day,LDL 82, goal < 70    Diabetes  HgbA1c 7.7, goal < 7.0 Uncontrolled. Continue SSI 1. Increase Lantus to 15 units QHS 2. Start Novolog Meal Coverage- Novolog 3 units tid with meals Hold metformin    NSTEMI in the setting of severe CAD- last cath showed occluded grafts but patent LIMA to LAD and patient was deemed not to be a redo CABG or PCI candidate. - continue ASA/Plavix/statin - BB and long acting nitrate held due to low BP.- CP free at this time .- discontinue IV Heparin gtt per cards today ,  2 D echo EF: 55% -  60%, Anticipate discharge tomorrow from a cardiac standpoint. Cards suspects Mild Angina with stress of seizure    DVT prophylaxsis  heparin drip   Code Status:      Code Status Orders        Start     Ordered   07/06/15 1757  Full code   Continuous     07/06/15 1804    Advance Directive Documentation        Most Recent Value   Type of Advance Directive  Healthcare Power of Attorney, Living will   Pre-existing out of facility DNR order (yellow form or pink MOST form)     "MOST" Form in Place?       Family Communication: family updated about patient's clinical progress Disposition  Plan:  Anticipate discharge tomorrow     Consultants:  Neurology  Cardiology  Procedures:  None  Antibiotics: Anti-infectives    None         HPI/Subjective: Had another seizure episode earlier today. At the end of the seizure, she had some mild chest discomfort. It resolved. No other chest discomfort.  Objective: Filed Vitals:   07/09/15 0736 07/09/15 0820 07/09/15 0821 07/09/15 0822  BP: 137/78 175/69  166/73  Pulse: 59  63 64  Temp: 97.5 F (36.4 C)      TempSrc: Oral     Resp: 15     Height:      Weight:      SpO2: 100%       Intake/Output Summary (Last 24 hours) at 07/09/15 1130 Last data filed at 07/09/15 0900  Gross per 24 hour  Intake    993 ml  Output    300 ml  Net    693 ml    Exam: General: Well developed, well nourished, in no acute distress Head: Eyes PERRLA, No xanthomas. Normal cephalic and atramatic Lungs: Clear bilaterally to auscultation and percussion. Heart: HRRR S1 S2 Pulses are 2+ & equal. Abdomen: Bowel sounds are positive, abdomen soft and non-tender without masses  Extremities: No clubbing, cyanosis or edema. DP +1 Neuro: Alert and oriented X 3. Psych: Good affect, responds appropriately   Data Review   Micro Results Recent Results (from the past 240 hour(s))  MRSA PCR Screening     Status: None   Collection Time: 07/06/15  6:30 PM  Result Value Ref Range Status   MRSA by PCR NEGATIVE NEGATIVE Final    Comment:        The GeneXpert MRSA Assay (FDA approved for NASAL specimens only), is one component of a comprehensive MRSA colonization surveillance program. It is not intended to diagnose MRSA infection nor to guide or monitor treatment for MRSA infections.     Radiology Reports Ct Head Wo Contrast  07/06/2015  CLINICAL DATA:  CODE STROKE, DR. Leroy Kennedy [phone], LEFT SIDE WEAKNESS, last seen normal at 1400, HX CVA. EXAM: CT HEAD WITHOUT CONTRAST TECHNIQUE: Contiguous axial images were obtained from the base of the skull through the vertex without intravenous contrast. COMPARISON:  MRI 02/06/2011, CT HEAD 08/09/2010 FINDINGS: There is central and cortical atrophy. There is no intra or extra-axial fluid collection or mass lesion. The basilar cisterns and ventricles have a normal appearance. There is no CT evidence for acute infarction or hemorrhage. Bone windows show mild mucoperiosteal thickening of the ethmoid air cells. No acute calvarial injury. Mastoid air cells are  normally aerated. IMPRESSION: 1. Atrophy. 2.  No evidence for acute intracranial abnormality. 3. Mild chronic sinusitis. Critical Value/emergent results were called by telephone at the time of interpretation on 07/06/2015 at 3:41 pm to Dr. Leroy Kennedy, who verbally acknowledged these results. Electronically Signed   By: Norva Pavlov M.D.   On: 07/06/2015 15:41   Mr [name] Wo Contrast  07/06/2015  CLINICAL DATA:  74 y.o. female brought in with acute onset left face weakness and word finding difficulty. Suspect right subcortical infarct. NIHSS 8, but her deficits are old except for speech impediment and mild left face weakness, and thus did not treat her with thrombolytic therapy or endovascular intervention.Admit to medicine and complete stroke work up. She is already on dual antiplatelet therapy and will continue this pending results stroke work up. EXAM: MRI HEAD WITHOUT CONTRAST MRA HEAD WITHOUT CONTRAST TECHNIQUE: Multiplanar, multiecho  pulse sequences of the brain and surrounding structures were obtained without intravenous contrast. Angiographic images of the head were obtained using MRA technique without contrast. COMPARISON:  CT head earlier today FINDINGS: MRI HEAD FINDINGS No evidence for acute infarction, hemorrhage, mass lesion, hydrocephalus, or extra-axial fluid. Generalized atrophy. Chronic microvascular ischemic change. No midline abnormality. Flow voids are maintained. Extracranial soft tissues unremarkable. Mild chronic sinus disease. BILATERAL cataract extraction. Similar appearance to earlier CT. MRA HEAD FINDINGS Dolichoectatic but widely patent internal carotid arteries. Widely patent basilar artery with vertebrals codominant. No A1 or M1 stenosis of significance. Focal narrowing proximal LEFT P1 PCA segment 50-75% stenosis. Uncertain significance given the lack of acute or chronic LEFT occipital infarction. No cerebellar branch occlusion. No visible saccular aneurysm. IMPRESSION: Atrophy  and Small vessel disease.  No acute intracranial findings. 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 Electronically Signed   By: Elsie Stain M.D.   On: 07/06/2015 18:42   Mr Brain Wo Contrast  07/06/2015  CLINICAL DATA:  74 y.o. female brought in with acute onset left face weakness and word finding difficulty. Suspect right subcortical infarct. NIHSS 8, but her deficits are old except for speech impediment and mild left face weakness, and thus did not treat her with thrombolytic therapy or endovascular intervention.Admit to medicine and complete stroke work up. She is already on dual antiplatelet therapy and will continue this pending results stroke work up. EXAM: MRI HEAD WITHOUT CONTRAST MRA HEAD WITHOUT CONTRAST TECHNIQUE: Multiplanar, multiecho pulse sequences of the brain and surrounding structures were obtained without intravenous contrast. Angiographic images of the head were obtained using MRA technique without contrast. COMPARISON:  CT head earlier today FINDINGS: MRI HEAD FINDINGS No evidence for acute infarction, hemorrhage, mass lesion, hydrocephalus, or extra-axial fluid. Generalized atrophy. Chronic microvascular ischemic change. No midline abnormality. Flow voids are maintained. Extracranial soft tissues unremarkable. Mild chronic sinus disease. BILATERAL cataract extraction. Similar appearance to earlier CT. MRA HEAD FINDINGS Dolichoectatic but widely patent internal carotid arteries. Widely patent basilar artery with vertebrals codominant. No A1 or M1 stenosis of significance. Focal narrowing proximal LEFT P1 PCA segment 50-75% stenosis. Uncertain significance given the lack of acute or chronic LEFT occipital infarction. No cerebellar branch occlusion. No visible saccular aneurysm. IMPRESSION: Atrophy and Small vessel disease.  No acute intracranial findings. 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 Electronically Signed   By: Elsie Stain M.D.   On: 07/06/2015 18:42     CBC  Recent Labs Lab 07/06/15 1510 07/06/15 1531 07/07/15 0755 07/08/15 0050 07/09/15 0405  WBC 5.5  --  5.2 5.2 5.1  HGB 12.4 14.6 12.2 10.7* 10.1*  HCT 37.9 43.0 37.8 33.2* 32.0*  PLT 196  --  173 150 145*  MCV 92.9  --  94.5 93.8 93.8  MCH 30.4  --  30.5 30.2 29.6  MCHC 32.7  --  32.3 32.2 31.6  RDW 13.2  --  13.4 13.1 13.3  LYMPHSABS 1.9  --   --   --   --   MONOABS 0.5  --   --   --   --   EOSABS 0.1  --   --   --   --   BASOSABS 0.0  --   --   --   --     Chemistries   Recent Labs Lab 07/06/15 1510 07/06/15 1531 07/07/15 0920 07/08/15 0050  NA 137 139 137 137  K 4.3 4.3 4.2 4.6  CL 100* 99* 103 104  CO2 24  --  26 26  GLUCOSE 286* 284* 191* 230*  BUN 12 14 10 14   CREATININE 0.79 0.70 0.72 0.97  CALCIUM 10.0  --  9.2 9.1  AST 34  --  25 19  ALT 23  --  18 17  ALKPHOS 57  --  52 44  BILITOT 0.5  --  0.7 0.4   ------------------------------------------------------------------------------------------------------------------ estimated creatinine clearance is 46 mL/min (by C-G formula based on Cr of 0.97). ------------------------------------------------------------------------------------------------------------------  Recent Labs  07/06/15 1716  HGBA1C 7.7*   ------------------------------------------------------------------------------------------------------------------  Recent Labs  07/07/15 0149  CHOL 158  HDL 43  LDLCALC 82  TRIG 167*  CHOLHDL 3.7   ------------------------------------------------------------------------------------------------------------------  Recent Labs  07/07/15 1534  TSH 2.915   ------------------------------------------------------------------------------------------------------------------  Recent Labs  07/08/15 0940  VITAMINB12 310    Coagulation profile  Recent Labs Lab 07/06/15 1510  INR 1.06    No results for input(s): DDIMER in the last 72  hours.  Cardiac Enzymes  Recent Labs Lab 07/06/15 2038 07/07/15 0149 07/09/15 1008  TROPONINI 0.89* 1.28* 0.17*   ------------------------------------------------------------------------------------------------------------------ Invalid input(s): POCBNP   CBG:  Recent Labs Lab 07/08/15 0853 07/08/15 1114 07/08/15 1701 07/08/15 2116 07/09/15 0851  GLUCAP 179* 321* 175* 201* 198*       Studies: No results found.    Lab Results  Component Value Date   HGBA1C 7.7* 07/06/2015   Lab Results  Component Value Date   LDLCALC 82 07/07/2015   CREATININE 0.97 07/08/2015       Scheduled Meds: . aspirin EC  81 mg Oral QHS  . atorvastatin  40 mg Oral QHS  . brimonidine  1 drop Both Eyes BID  . clopidogrel  75 mg Oral Daily  . famotidine  20 mg Oral Daily  . insulin aspart  0-15 Units Subcutaneous TID WC  . insulin aspart  0-5 Units Subcutaneous QHS  . insulin glargine  12 Units Subcutaneous QHS  . latanoprost  1 drop Both Eyes QHS  . levETIRAcetam  1,000 mg Oral Daily  . nitroGLYCERIN  0.2 mg Transdermal Daily  . nitroGLYCERIN  0.4 mg Sublingual Once  . [START ON 07/10/2015] pantoprazole  40 mg Oral Daily  . sodium chloride  3 mL Intravenous Q12H  . sodium chloride  3 mL Intravenous Q12H  . timolol  1 drop Both Eyes BID   Continuous Infusions:    Active Problems:   Stroke [facility])   Diabetes mellitus with complication (HCC)   Essential hypertension   HLD (hyperlipidemia)   Chest pain   Memory deficit   Confusional state    Time spent: 45 minutes   [facility]  [facility] Pager [phone]. If 7PM-7AM, please contact night-coverage at [email or web address], password [facility] 07/09/2015, 11:30 AM  LOS: 3 days

## 2015-07-09 NOTE — Progress Notes (Signed)
At about 0820 this morning, patient called c/o dizzyness. Another RN assisted patient from chair to bed. Pt c/o chest pain and witnessed patient head begin to shake mildly. Patient alert throughout episode. Vitals taken and SBP in 160s. Per RN, episode quickly resolved on its own. No need for further interventions. Dr. Susie CassetteAbrol called and informed. Cardiology called. Stroke/neuro team called. New orders given. Will continue to monitor patient.

## 2015-07-09 NOTE — Care Management Note (Addendum)
Case Management Note  Patient Details  Name: Leah Small MRN: [id number] Date of Birth: [birth year]  Subjective/Objective:      Stroke               Action/Plan: Chart reviewed.  Scheduled dc to SNF. CSW following for SNF placement.    Expected Discharge Date:  07/10/2015               Expected Discharge Plan:  Skilled Nursing Facility  In-House Referral:  Clinical Social Work  Discharge planning Services  CM Consult  Status of Service:  Completed, signed off  Medicare Important Message Given:    Date Medicare IM Given:    Medicare IM give by:    Date Additional Medicare IM Given:    Additional Medicare Important Message give by:     If discussed at Long Length of Stay Meetings, dates discussed:    Additional Comments: UR completed.   Elliot CousinShavis, Hadeel Hillebrand Ellen, RN 07/09/2015, 11:43 AM

## 2015-07-09 NOTE — Care Management Important Message (Signed)
Important Message  Patient Details  Name: Leah Small MRN: [id number] Date of Birth: 08/09/1941   Medicare Important Message Given:  Yes    [name] 07/09/2015, 1:05 PM

## 2015-07-10 DIAGNOSIS — E119 Type 2 diabetes mellitus without complications: Secondary | ICD-10-CM | POA: Diagnosis not present

## 2015-07-10 DIAGNOSIS — I209 Angina pectoris, unspecified: Secondary | ICD-10-CM

## 2015-07-10 LAB — COMPREHENSIVE METABOLIC PANEL
ALK PHOS: 52 U/L (ref 38–126)
ALT: 18 U/L (ref 14–54)
ANION GAP: 7 (ref 5–15)
AST: 21 U/L (ref 15–41)
Albumin: 3.2 g/dL — ABNORMAL LOW (ref 3.5–5.0)
BILIRUBIN TOTAL: 0.6 mg/dL (ref 0.3–1.2)
BUN: 10 mg/dL (ref 6–20)
CALCIUM: 9.2 mg/dL (ref 8.9–10.3)
CO2: 25 mmol/L (ref 22–32)
Chloride: 109 mmol/L (ref 101–111)
Creatinine, Ser: 0.74 mg/dL (ref 0.44–1.00)
Glucose, Bld: 144 mg/dL — ABNORMAL HIGH (ref 65–99)
Potassium: 4.6 mmol/L (ref 3.5–5.1)
Sodium: 141 mmol/L (ref 135–145)
TOTAL PROTEIN: 6.1 g/dL — AB (ref 6.5–8.1)

## 2015-07-10 LAB — CBC
HEMATOCRIT: 32.8 % — AB (ref 36.0–46.0)
HEMOGLOBIN: 10.7 g/dL — AB (ref 12.0–15.0)
MCH: 30.7 pg (ref 26.0–34.0)
MCHC: 32.6 g/dL (ref 30.0–36.0)
MCV: 94 fL (ref 78.0–100.0)
Platelets: 144 10*3/uL — ABNORMAL LOW (ref 150–400)
RBC: 3.49 MIL/uL — ABNORMAL LOW (ref 3.87–5.11)
RDW: 13.5 % (ref 11.5–15.5)
WBC: 4.4 10*3/uL (ref 4.0–10.5)

## 2015-07-10 LAB — GLUCOSE, CAPILLARY
GLUCOSE-CAPILLARY: 139 mg/dL — AB (ref 65–99)
GLUCOSE-CAPILLARY: 226 mg/dL — AB (ref 65–99)
Glucose-Capillary: 238 mg/dL — ABNORMAL HIGH (ref 65–99)

## 2015-07-10 MED ORDER — TIMOLOL MALEATE 0.5 % OP SOLN
1.0000 [drp] | Freq: Two times a day (BID) | OPHTHALMIC | Status: DC
Start: 2015-07-10 — End: 2015-07-10
  Administered 2015-07-10: 1 [drp] via OPHTHALMIC
  Filled 2015-07-10: qty 5

## 2015-07-10 MED ORDER — LEVETIRACETAM ER 500 MG PO TB24: 500.0000 mg | ORAL_TABLET | Freq: Every day | ORAL | Status: DC

## 2015-07-10 MED ORDER — PANTOPRAZOLE SODIUM 40 MG PO TBEC: 40.0000 mg | DELAYED_RELEASE_TABLET | Freq: Every day | ORAL | Status: DC

## 2015-07-10 MED ORDER — ATORVASTATIN CALCIUM 40 MG PO TABS: 40.0000 mg | ORAL_TABLET | Freq: Every day | ORAL | Status: DC

## 2015-07-10 MED ORDER — ISOSORBIDE MONONITRATE ER 30 MG PO TB24: 15.0000 mg | ORAL_TABLET | Freq: Every day | ORAL | Status: DC

## 2015-07-10 NOTE — Clinical Social Work Note (Signed)
Patient transferred to 6E. Handoff provided to assigned CSW. This CSW signing off.   Derenda FennelBashira Kush [place], MSW, LCSWA [phone] 07/10/2015 9:52 AM

## 2015-07-10 NOTE — Progress Notes (Signed)
SUBJECTIVE:  Feels well.  Had some mild chest discomfort with straining for BM, which is normal for her.  No other chest discomfort.  Had hypotension when NTG 0.2 mg patch applied yesterday.  OBJECTIVE:   Vitals:   Filed Vitals:   07/09/15 1712 07/09/15 2120 07/10/15 0517 07/10/15 1000  BP: 115/52 116/53 117/57 133/71  Pulse: 51 56 56 64  Temp: 97.6 F (36.4 C) 97.5 F (36.4 C) 97.7 F (36.5 C) 97.6 F (36.4 C)  TempSrc: Oral Oral Oral Oral  Resp: 16 17 18 18   Height:      Weight:  147 lb 14.9 oz (67.1 kg)    SpO2: 99% 99% 97% 98%   I&O's:    Intake/Output Summary (Last 24 hours) at 07/10/15 1145 Last data filed at 07/10/15 0900  Gross per 24 hour  Intake    570 ml  Output      0 ml  Net    570 ml   TELEMETRY: Reviewed telemetry pt in normal sinus rhythm:     PHYSICAL EXAM General: Well developed, well nourished, in no acute distress Head:   Normal cephalic and atramatic  Lungs:  Clear bilaterally to auscultation. Heart:  HRRR S1 S2  No JVD.   Abdomen: abdomen soft and non-tender Msk:  Back normal,  Normal strength and tone for age. Extremities:   No edema.   Neuro: Alert and oriented. Psych:  Normal affect, responds appropriately Skin: No rash   LABS: Basic Metabolic Panel:  Recent Labs  52/84/1311/14/16 0050 07/10/15 0540  NA 137 141  K 4.6 4.6  CL 104 109  CO2 26 25  GLUCOSE 230* 144*  BUN 14 10  CREATININE 0.97 0.74  CALCIUM 9.1 9.2   Liver Function Tests:  Recent Labs  07/08/15 0050 07/10/15 0540  AST 19 21  ALT 17 18  ALKPHOS 44 52  BILITOT 0.4 0.6  PROT 5.8* 6.1*  ALBUMIN 3.1* 3.2*   No results for input(s): LIPASE, AMYLASE in the last 72 hours. CBC:  Recent Labs  07/09/15 0405 07/10/15 0540  WBC 5.1 4.4  HGB 10.1* 10.7*  HCT 32.0* 32.8*  MCV 93.8 94.0  PLT 145* 144*   Cardiac Enzymes:  Recent Labs  07/09/15 1008  TROPONINI 0.17*   BNP: Invalid input(s): POCBNP D-Dimer: No results for input(s): DDIMER in the last 72  hours. Hemoglobin A1C: No results for input(s): HGBA1C in the last 72 hours. Fasting Lipid Panel: No results for input(s): CHOL, HDL, LDLCALC, TRIG, CHOLHDL, LDLDIRECT in the last 72 hours. Thyroid Function Tests:  Recent Labs  07/07/15 1534  TSH 2.915   Anemia Panel:  Recent Labs  07/08/15 0940  VITAMINB12 310   Coag Panel:   Lab Results  Component Value Date   INR 1.06 07/06/2015   INR 0.9 09/16/2011    RADIOLOGY: Ct Head Wo Contrast  07/06/2015  CLINICAL DATA:  CODE STROKE, DR. Leroy KennedyAMILO [phone], LEFT SIDE WEAKNESS, last seen normal at 1400, HX CVA. EXAM: CT HEAD WITHOUT CONTRAST TECHNIQUE: Contiguous axial images were obtained from the base of the skull through the vertex without intravenous contrast. COMPARISON:  MRI 02/06/2011, CT HEAD 08/09/2010 FINDINGS: There is central and cortical atrophy. There is no intra or extra-axial fluid collection or mass lesion. The basilar cisterns and ventricles have a normal appearance. There is no CT evidence for acute infarction or hemorrhage. Bone windows show mild mucoperiosteal thickening of the ethmoid air cells. No acute calvarial injury. Mastoid air cells are  normally aerated. IMPRESSION: 1. Atrophy. 2.  No evidence for acute intracranial abnormality. 3. Mild chronic sinusitis. Critical Value/emergent results were called by telephone at the time of interpretation on 07/06/2015 at 3:41 pm to Dr. Leroy Kennedy, who verbally acknowledged these results. Electronically Signed   By: Norva Pavlov M.D.   On: 07/06/2015 15:41   Mr [name] Wo Contrast  07/06/2015  CLINICAL DATA:  74 y.o. female brought in with acute onset left face weakness and word finding difficulty. Suspect right subcortical infarct. NIHSS 8, but her deficits are old except for speech impediment and mild left face weakness, and thus did not treat her with thrombolytic therapy or endovascular intervention.Admit to medicine and complete stroke work up. She is already on dual  antiplatelet therapy and will continue this pending results stroke work up. EXAM: MRI HEAD WITHOUT CONTRAST MRA HEAD WITHOUT CONTRAST TECHNIQUE: Multiplanar, multiecho pulse sequences of the brain and surrounding structures were obtained without intravenous contrast. Angiographic images of the head were obtained using MRA technique without contrast. COMPARISON:  CT head earlier today FINDINGS: MRI HEAD FINDINGS No evidence for acute infarction, hemorrhage, mass lesion, hydrocephalus, or extra-axial fluid. Generalized atrophy. Chronic microvascular ischemic change. No midline abnormality. Flow voids are maintained. Extracranial soft tissues unremarkable. Mild chronic sinus disease. BILATERAL cataract extraction. Similar appearance to earlier CT. MRA HEAD FINDINGS Dolichoectatic but widely patent internal carotid arteries. Widely patent basilar artery with vertebrals codominant. No A1 or M1 stenosis of significance. Focal narrowing proximal LEFT P1 PCA segment 50-75% stenosis. Uncertain significance given the lack of acute or chronic LEFT occipital infarction. No cerebellar branch occlusion. No visible saccular aneurysm. IMPRESSION: Atrophy and small vessel disease.  No acute intracranial findings. 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 Electronically Signed   By: Elsie Stain M.D.   On: 07/06/2015 18:42   Mr Brain Wo Contrast  07/06/2015  CLINICAL DATA:  74 y.o. female brought in with acute onset left face weakness and word finding difficulty. Suspect right subcortical infarct. NIHSS 8, but her deficits are old except for speech impediment and mild left face weakness, and thus did not treat her with thrombolytic therapy or endovascular intervention.Admit to medicine and complete stroke work up. She is already on dual antiplatelet therapy and will continue this pending results stroke work up. EXAM: MRI HEAD WITHOUT CONTRAST MRA HEAD WITHOUT CONTRAST TECHNIQUE: Multiplanar,  multiecho pulse sequences of the brain and surrounding structures were obtained without intravenous contrast. Angiographic images of the head were obtained using MRA technique without contrast. COMPARISON:  CT head earlier today FINDINGS: MRI HEAD FINDINGS No evidence for acute infarction, hemorrhage, mass lesion, hydrocephalus, or extra-axial fluid. Generalized atrophy. Chronic microvascular ischemic change. No midline abnormality. Flow voids are maintained. Extracranial soft tissues unremarkable. Mild chronic sinus disease. BILATERAL cataract extraction. Similar appearance to earlier CT. MRA HEAD FINDINGS Dolichoectatic but widely patent internal carotid arteries. Widely patent basilar artery with vertebrals codominant. No A1 or M1 stenosis of significance. Focal narrowing proximal LEFT P1 PCA segment 50-75% stenosis. Uncertain significance given the lack of acute or chronic LEFT occipital infarction. No cerebellar branch occlusion. No visible saccular aneurysm. IMPRESSION: Atrophy and small vessel disease.  No acute intracranial findings. 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 Electronically Signed   By: Elsie Stain M.D.   On: 07/06/2015 18:42      ASSESSMENT: [place] Harps:  NSTEMI in the setting of severe CAD.  Stable from a cardiac standpoint. Mild  Angina with  stress of seizure. straining. Hold off on NTG patch. Would resume home dose at discharge if BP increases. F/u with Dr. Gwen Pounds. Medical therapy of NSTEMI.  Would not plan on repeat cath at this time given that she had no PCI targets in the past. Primary issues appears to be neurologic at this time.  Less seizure activity on Keppra.  OK to d/c from a cardiac standpoint.   Corky Crafts, MD  07/10/2015  11:45 AM

## 2015-07-10 NOTE — Discharge Summary (Signed)
Physician Discharge Summary  Leah Small MRN: [id number] DOB/AGE: 11-09-1940 74 y.o.  PCP: Idelle Crouch, MD   Admit date: 07/06/2015 Discharge date: 07/10/2015  Discharge Diagnoses:     Active Problems:   Stroke ([place])   Diabetes mellitus with complication (HCC)   Essential hypertension   HLD (hyperlipidemia)   Chest pain   Memory deficit   Confusional state    Follow-up recommendations Follow-up with PCP in 3-5 days , including all  additional recommended appointments as below Follow-up CBC, CMP in 3-5 days OP neuro appt scheduled with DR. Manuella Ghazi at [facility] next week to assess for seizures.     Medication List    STOP taking these medications        amLODipine 2.5 MG tablet  Commonly known as:  NORVASC     glipiZIDE 10 MG 24 hr tablet  Commonly known as:  GLUCOTROL XL     metFORMIN 1000 MG tablet  Commonly known as:  GLUCOPHAGE     metoprolol tartrate 25 MG tablet  Commonly known as:  LOPRESSOR     ranitidine 75 MG tablet  Commonly known as:  ZANTAC      TAKE these medications        aspirin EC 81 MG tablet  Take 81 mg by mouth at bedtime.     atorvastatin 40 MG tablet  Commonly known as:  LIPITOR  Take 1 tablet (40 mg total) by mouth at bedtime.     bimatoprost 0.01 % Soln  Commonly known as:  LUMIGAN  Place 1 drop into both eyes at bedtime.     brimonidine 0.2 % ophthalmic solution  Commonly known as:  ALPHAGAN  Place 1 drop into both eyes 2 (two) times daily.     CALCIUM PO  Take 1 tablet by mouth daily.     clopidogrel 75 MG tablet  Commonly known as:  PLAVIX  Take 75 mg by mouth daily.     HUMALOG MIX 75/25 KWIKPEN (75-25) 100 UNIT/ML Kwikpen  Generic drug:  Insulin Lispro Prot & Lispro  Inject 6-12 Units into the skin 4 (four) times daily - after meals and at bedtime. Per sliding scale: 6 units plus:   CBG 150-200 add 1 unit, 201-250 add 2 units, 251-300 add 3 units, 301-350 add 4 units 351-400 add 5 units, >400 add 6  units and call MD     isosorbide mononitrate 30 MG 24 hr tablet  Commonly known as:  IMDUR  Take 0.5 tablets (15 mg total) by mouth daily.     LANTUS SOLOSTAR 100 UNIT/ML Solostar Pen  Generic drug:  Insulin Glargine  Inject 24 Units into the skin at bedtime.     levETIRAcetam 500 MG 24 hr tablet  Commonly known as:  KEPPRA XR  Take 1 tablet (500 mg total) by mouth daily.     MAGNESIUM PO  Take 1 tablet by mouth daily.     nitroGLYCERIN 0.4 MG SL tablet  Commonly known as:  NITROSTAT  Place 0.4 mg under the tongue every 5 (five) minutes as needed for chest pain.     nystatin cream  Commonly known as:  MYCOSTATIN  Apply 1 application topically 2 (two) times daily as needed (yeast infection).     OVER THE COUNTER MEDICATION  Take 1 tablet by mouth daily as needed (sinus congestion). Coricidin hbp     pantoprazole 40 MG tablet  Commonly known as:  PROTONIX  Take 1 tablet (40 mg total) by  mouth daily.     timolol 0.5 % ophthalmic solution  Commonly known as:  BETIMOL  Place 1 drop into both eyes 2 (two) times daily.     triamcinolone ointment 0.1 %  Commonly known as:  KENALOG  Apply 1 application topically 3 (three) times daily as needed (itching from psoriasis).     TYLENOL ARTHRITIS PAIN 650 MG CR tablet  Generic drug:  acetaminophen  Take 650-1,300 mg by mouth every 8 (eight) hours as needed for pain.     vitamin B-12 1000 MCG tablet  Commonly known as:  CYANOCOBALAMIN  Take 1,000 mcg by mouth daily.     vitamin E 400 UNIT capsule  Take 400 Units by mouth daily.     ZINC PO  Take 1 tablet by mouth daily.         Discharge Condition: Stable Discharge Instructions       Discharge Instructions    Ambulatory referral to Neurology    Complete by:  As directed   Stroke office f/u in 1 month     Diet - low sodium heart healthy    Complete by:  As directed      Diet - low sodium heart healthy    Complete by:  As directed      Increase activity slowly     Complete by:  As directed      Increase activity slowly    Complete by:  As directed            Allergies  Allergen Reactions  . Vicodin [Hydrocodone-Acetaminophen] Nausea And Vomiting      Disposition:    Consults:  Neurology Cardiology   Significant Diagnostic Studies:  Ct Head Wo Contrast  07/06/2015  CLINICAL DATA:  CODE STROKE, DR. Armida Sans [phone], LEFT SIDE WEAKNESS, last seen normal at 1400, HX CVA. EXAM: CT HEAD WITHOUT CONTRAST TECHNIQUE: Contiguous axial images were obtained from the base of the skull through the vertex without intravenous contrast. COMPARISON:  MRI 02/06/2011, CT HEAD 08/09/2010 FINDINGS: There is central and cortical atrophy. There is no intra or extra-axial fluid collection or mass lesion. The basilar cisterns and ventricles have a normal appearance. There is no CT evidence for acute infarction or hemorrhage. Bone windows show mild mucoperiosteal thickening of the ethmoid air cells. No acute calvarial injury. Mastoid air cells are normally aerated. IMPRESSION: 1. Atrophy. 2.  No evidence for acute intracranial abnormality. 3. Mild chronic sinusitis. Critical Value/emergent results were called by telephone at the time of interpretation on 07/06/2015 at 3:41 pm to Dr. Armida Sans, who verbally acknowledged these results. Electronically Signed   By: Nolon Nations M.D.   On: 07/06/2015 15:41   Mr Virgel Paling Wo Contrast  07/06/2015  CLINICAL DATA:  74 y.o. female brought in with acute onset left face weakness and word finding difficulty. Suspect right subcortical infarct. NIHSS 8, but her deficits are old except for speech impediment and mild left face weakness, and thus did not treat her with thrombolytic therapy or endovascular intervention.Admit to medicine and complete stroke work up. She is already on dual antiplatelet therapy and will continue this pending results stroke work up. EXAM: MRI HEAD WITHOUT CONTRAST MRA HEAD WITHOUT CONTRAST TECHNIQUE:  Multiplanar, multiecho pulse sequences of the brain and surrounding structures were obtained without intravenous contrast. Angiographic images of the head were obtained using MRA technique without contrast. COMPARISON:  CT head earlier today FINDINGS: MRI HEAD FINDINGS No evidence for acute infarction, hemorrhage, mass lesion, hydrocephalus, or  extra-axial fluid. Generalized atrophy. Chronic microvascular ischemic change. No midline abnormality. Flow voids are maintained. Extracranial soft tissues unremarkable. Mild chronic sinus disease. BILATERAL cataract extraction. Similar appearance to earlier CT. MRA HEAD FINDINGS Dolichoectatic but widely patent internal carotid arteries. Widely patent basilar artery with vertebrals codominant. No A1 or M1 stenosis of significance. Focal narrowing proximal LEFT P1 PCA segment 50-75% stenosis. Uncertain significance given the lack of acute or chronic LEFT occipital infarction. No cerebellar branch occlusion. No visible saccular aneurysm. IMPRESSION: Atrophy and small vessel disease.  No acute intracranial findings. 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 Electronically Signed   By: Staci Righter M.D.   On: 07/06/2015 18:42   Mr Brain Wo Contrast  07/06/2015  CLINICAL DATA:  74 y.o. female brought in with acute onset left face weakness and word finding difficulty. Suspect right subcortical infarct. NIHSS 8, but her deficits are old except for speech impediment and mild left face weakness, and thus did not treat her with thrombolytic therapy or endovascular intervention.Admit to medicine and complete stroke work up. She is already on dual antiplatelet therapy and will continue this pending results stroke work up. EXAM: MRI HEAD WITHOUT CONTRAST MRA HEAD WITHOUT CONTRAST TECHNIQUE: Multiplanar, multiecho pulse sequences of the brain and surrounding structures were obtained without intravenous contrast. Angiographic images of the head were  obtained using MRA technique without contrast. COMPARISON:  CT head earlier today FINDINGS: MRI HEAD FINDINGS No evidence for acute infarction, hemorrhage, mass lesion, hydrocephalus, or extra-axial fluid. Generalized atrophy. Chronic microvascular ischemic change. No midline abnormality. Flow voids are maintained. Extracranial soft tissues unremarkable. Mild chronic sinus disease. BILATERAL cataract extraction. Similar appearance to earlier CT. MRA HEAD FINDINGS Dolichoectatic but widely patent internal carotid arteries. Widely patent basilar artery with vertebrals codominant. No A1 or M1 stenosis of significance. Focal narrowing proximal LEFT P1 PCA segment 50-75% stenosis. Uncertain significance given the lack of acute or chronic LEFT occipital infarction. No cerebellar branch occlusion. No visible saccular aneurysm. IMPRESSION: Atrophy and small vessel disease.  No acute intracranial findings. 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 Electronically Signed   By: Staci Righter M.D.   On: 07/06/2015 18:42    2-D echo  LV EF: 55% -  60%  ------------------------------------------------------------------- Indications:   CVA 66.  ------------------------------------------------------------------- History:  PMH:  Coronary artery disease. Stroke. Risk factors: Hypertension. Diabetes mellitus.  ------------------------------------------------------------------- Study Conclusions  - Left ventricle: The cavity size was normal. Wall thickness was normal. Systolic function was normal. The estimated ejection fraction was in the range of 55% to 60%. - Mitral valve: Calcified annulus. Mildly thickened leaflets . There was mild regurgitation. - Left atrium: The atrium was mildly dilated. - Tricuspid valve: There was moderate regurgitation. - Pulmonary arteries: PA peak pressure: 44 mm Hg (S).  Filed Weights   07/06/15 1533 07/06/15 1836 07/09/15 2120   Weight: 71 kg (156 lb 8.4 oz) 66.8 kg (147 lb 4.3 oz) 67.1 kg (147 lb 14.9 oz)     Microbiology: Recent Results (from the past 240 hour(s))  MRSA PCR Screening     Status: None   Collection Time: 07/06/15  6:30 PM  Result Value Ref Range Status   MRSA by PCR NEGATIVE NEGATIVE Final    Comment:        The GeneXpert MRSA Assay (FDA approved for NASAL specimens only), is one component of a comprehensive MRSA colonization surveillance program. It is not intended to diagnose MRSA infection nor to  guide or monitor treatment for MRSA infections.        Blood Culture No results found for: SDES, [place], CULT, REPTSTATUS    Labs: Results for orders placed or performed during the hospital encounter of 07/06/15 (from the past 48 hour(s))  Glucose, capillary     Status: Abnormal   Collection Time: 07/08/15 11:14 AM  Result Value Ref Range   Glucose-Capillary 321 (H) 65 - 99 mg/dL   Comment 1 Notify RN    Comment 2 Document in Chart   Glucose, capillary     Status: Abnormal   Collection Time: 07/08/15  5:01 PM  Result Value Ref Range   Glucose-Capillary 175 (H) 65 - 99 mg/dL   Comment 1 Notify RN    Comment 2 Document in Chart   Heparin level (unfractionated)     Status: None   Collection Time: 07/08/15  6:31 PM  Result Value Ref Range   Heparin Unfractionated 0.57 0.30 - 0.70 IU/mL    Comment:        IF HEPARIN RESULTS ARE BELOW EXPECTED VALUES, AND PATIENT DOSAGE HAS BEEN CONFIRMED, SUGGEST FOLLOW UP TESTING OF ANTITHROMBIN III LEVELS.   Glucose, capillary     Status: Abnormal   Collection Time: 07/08/15  9:16 PM  Result Value Ref Range   Glucose-Capillary 201 (H) 65 - 99 mg/dL  CBC     Status: Abnormal   Collection Time: 07/09/15  4:05 AM  Result Value Ref Range   WBC 5.1 4.0 - 10.5 K/uL   RBC 3.41 (L) 3.87 - 5.11 MIL/uL   Hemoglobin 10.1 (L) 12.0 - 15.0 g/dL   HCT 32.0 (L) 36.0 - 46.0 %   MCV 93.8 78.0 - 100.0 fL   MCH 29.6 26.0 - 34.0 pg   MCHC 31.6  30.0 - 36.0 g/dL   RDW 13.3 11.5 - 15.5 %   Platelets 145 (L) 150 - 400 K/uL  Heparin level (unfractionated)     Status: None   Collection Time: 07/09/15  4:05 AM  Result Value Ref Range   Heparin Unfractionated 0.46 0.30 - 0.70 IU/mL    Comment:        IF HEPARIN RESULTS ARE BELOW EXPECTED VALUES, AND PATIENT DOSAGE HAS BEEN CONFIRMED, SUGGEST FOLLOW UP TESTING OF ANTITHROMBIN III LEVELS.   Glucose, capillary     Status: Abnormal   Collection Time: 07/09/15  8:51 AM  Result Value Ref Range   Glucose-Capillary 198 (H) 65 - 99 mg/dL   Comment 1 Notify RN    Comment 2 Document in Chart   Troponin I     Status: Abnormal   Collection Time: 07/09/15 10:08 AM  Result Value Ref Range   Troponin I 0.17 (H) <0.031 ng/mL    Comment:        PERSISTENTLY INCREASED TROPONIN VALUES IN THE RANGE OF 0.04-0.49 ng/mL CAN BE SEEN IN:       -UNSTABLE ANGINA       -CONGESTIVE HEART FAILURE       -MYOCARDITIS       -CHEST TRAUMA       -ARRYHTHMIAS       -LATE PRESENTING MYOCARDIAL INFARCTION       -COPD   CLINICAL FOLLOW-UP RECOMMENDED.   Glucose, capillary     Status: Abnormal   Collection Time: 07/09/15 11:59 AM  Result Value Ref Range   Glucose-Capillary 347 (H) 65 - 99 mg/dL   Comment 1 Notify RN    Comment 2 Document in  Chart   Glucose, capillary     Status: Abnormal   Collection Time: 07/09/15  4:23 PM  Result Value Ref Range   Glucose-Capillary 265 (H) 65 - 99 mg/dL   Comment 1 Notify RN    Comment 2 Document in Chart   Glucose, capillary     Status: Abnormal   Collection Time: 07/09/15  9:17 PM  Result Value Ref Range   Glucose-Capillary 146 (H) 65 - 99 mg/dL  CBC     Status: Abnormal   Collection Time: 07/10/15  5:40 AM  Result Value Ref Range   WBC 4.4 4.0 - 10.5 K/uL   RBC 3.49 (L) 3.87 - 5.11 MIL/uL   Hemoglobin 10.7 (L) 12.0 - 15.0 g/dL   HCT 32.8 (L) 36.0 - 46.0 %   MCV 94.0 78.0 - 100.0 fL   MCH 30.7 26.0 - 34.0 pg   MCHC 32.6 30.0 - 36.0 g/dL   RDW 13.5 11.5 -  15.5 %   Platelets 144 (L) 150 - 400 K/uL  Comprehensive metabolic panel     Status: Abnormal   Collection Time: 07/10/15  5:40 AM  Result Value Ref Range   Sodium 141 135 - 145 mmol/L   Potassium 4.6 3.5 - 5.1 mmol/L   Chloride 109 101 - 111 mmol/L   CO2 25 22 - 32 mmol/L   Glucose, Bld 144 (H) 65 - 99 mg/dL   BUN 10 6 - 20 mg/dL   Creatinine, Ser 0.74 0.44 - 1.00 mg/dL   Calcium 9.2 8.9 - 10.3 mg/dL   Total Protein 6.1 (L) 6.5 - 8.1 g/dL   Albumin 3.2 (L) 3.5 - 5.0 g/dL   AST 21 15 - 41 U/L   ALT 18 14 - 54 U/L   Alkaline Phosphatase 52 38 - 126 U/L   Total Bilirubin 0.6 0.3 - 1.2 mg/dL   GFR calc non Af Amer >60 >60 mL/min   GFR calc Af Amer >60 >60 mL/min    Comment: (NOTE) The eGFR has been calculated using the CKD EPI equation. This calculation has not been validated in all clinical situations. eGFR's persistently <60 mL/min signify possible Chronic Kidney Disease.    Anion gap 7 5 - 15  Glucose, capillary     Status: Abnormal   Collection Time: 07/10/15  7:15 AM  Result Value Ref Range   Glucose-Capillary 139 (H) 65 - 99 mg/dL     Lipid Panel     Component Value Date/Time   CHOL 158 07/07/2015 0149   TRIG 167* 07/07/2015 0149   HDL 43 07/07/2015 0149   CHOLHDL 3.7 07/07/2015 0149   VLDL 33 07/07/2015 0149   LDLCALC 82 07/07/2015 0149     Lab Results  Component Value Date   HGBA1C 7.7* 07/06/2015     Lab Results  Component Value Date   LDLCALC 82 07/07/2015   CREATININE 0.74 07/10/2015     Brief summary 74 y.o. 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 At baseline patient has left arm and leg weakness/numbness residual from prior stroke but reportedly is high functioning. She denies associated HA, vertigo, double vision, difficulty swallowing, confusion, or vision  impairment. Complains of mid sternal chest pain similar to her episodic angina. NIHSS 8. CT brain was personally reviewed and showed no acute abnormality. Stated that she  takes aspirin + plavix daily.   Assessment and plan Possible TIA: Non-dominant secondary to small vessel disease. Would also condiser partial complex seizure as a possibility. Resultant Left sided weakness. MRI - No acute intracranial findings. MRA - 50-75% LEFT P1 stenosis  Carotid Doppler1-39% ICA stenosis. Bilateral CEAs appear patent  2D Echo - pending  LDL - 82  HgbA1c 7.7  VTE prophylaxis - subcutaneous heparin  Diet regular diet and thin liquids, continue aspirin 81 mg daily and clopidogrel 75 mg daily per neurology  PT OT recommendations recommend SNF vs HHPT   Neurology recommended EEG monitoring, continue Keppra 500 extended release daily . Neurology expects symptoms to improve over time. OP neuro appt scheduled with DR. Manuella Ghazi at [facility] next week to assess for seizures.   Confusion spells EEG ordered by neurology to rule out complex partial seizures Continue Keppra 500 mg extended-release    Hypertension . Hypertensive this morning , - currently not on scheduled antihypertensive medications.Permissive hypertension (OK if < 220/120) but gradually normalize in 5-7 days.  patient to continue with low dose imdur and nitropaste and metoprolol DC due to hypotension and bradycardia    Hyperlipidemia  Increase Lipitor from 20-40 mg a day,LDL 82, goal < 70    Diabetes HgbA1c 7.7, goal < 7.0 Uncontrolled. Continue SSI Cont Lantus  And SSI at the same home dose , no changes  DC oral hypoglycemics as pts regimen needs to be simplified      NSTEMI, abnl troponin  in the setting of severe CAD- last cath showed occluded grafts but patent LIMA to LAD and patient was deemed not to be a redo CABG or PCI candidate. - continue ASA/Plavix/statin - BB and long acting nitrate held due to  low BP but restarted low dose imdur - CP free at this time .- discontinued IV Heparin gtt after 48 hrs    , 2 D echo EF: 55% -  60%, Anticipate discharge today  from a cardiac standpoint. Cards suspects Mild Angina with stress of seizure.Would not plan on repeat cath at this time given that she had no PCI targets in the past. Primary issues appears to be neurologic at this time      Discharge Exam:    Blood pressure 133/71, pulse 64, temperature 97.6 F (36.4 C), temperature source Oral, resp. rate 18, height _0  (1.6 m), weight 67.1 kg (147 lb 14.9 oz), SpO2 98 %.  General - Well nourished, well developed middle aged 79 lady, in no acute distress  Cardiovascular - Regular rate and rhythm Pulmonary: CTA Abdomen: NT, ND, normal bowel sounds Extremities: No C/C/E  Neurological Exam Mental Status: Normal    Follow-up Information    Follow up with SPARKS,JEFFREY D, MD. Schedule an appointment as soon as possible for a visit in 3 days.   Specialty:  Internal Medicine   Contact information:   [place] [address] [phone]       Follow up with [facility] K, MD. Schedule an appointment as soon as possible for a visit in 1 month.   Specialty:  Neurology   Contact information:   [facility] [facility] [place] [address] [phone]       Signed: Reyne Dumas 07/10/2015, 11:04 AM        Time spent >45 mins

## 2015-07-10 NOTE — Clinical Social Work Note (Signed)
Patient discharged [facility] [place] today, transported by son.  Discharge summary and SNF transfer report transmitted to facility via [facility].    Genelle BalVanessa Pate Aylward, MSW, LCSW Licensed Clinical Social Worker Clinical Social Work Department [facility] [phone]

## 2015-07-10 NOTE — Progress Notes (Signed)
[facility] called to [place] at [facility].  All questions answered. Saul Fordyceonald Rubendall, patient's son is transporting [facility].

## 2015-07-11 DIAGNOSIS — E119 Type 2 diabetes mellitus without complications: Secondary | ICD-10-CM | POA: Diagnosis present

## 2015-07-11 LAB — GLUCOSE, CAPILLARY
GLUCOSE-CAPILLARY: 171 mg/dL — AB (ref 65–99)
GLUCOSE-CAPILLARY: 281 mg/dL — AB (ref 65–99)
GLUCOSE-CAPILLARY: 236 mg/dL — AB (ref 65–99)
Glucose-Capillary: 306 mg/dL — ABNORMAL HIGH (ref 65–99)
Glucose-Capillary: 317 mg/dL — ABNORMAL HIGH (ref 65–99)
Glucose-Capillary: 167 mg/dL — ABNORMAL HIGH (ref 65–99)

## 2015-07-12 DIAGNOSIS — E119 Type 2 diabetes mellitus without complications: Secondary | ICD-10-CM | POA: Diagnosis not present

## 2015-07-12 LAB — GLUCOSE, CAPILLARY
GLUCOSE-CAPILLARY: 166 mg/dL — AB (ref 65–99)
Glucose-Capillary: 243 mg/dL — ABNORMAL HIGH (ref 65–99)

## 2015-07-13 LAB — GLUCOSE, CAPILLARY
GLUCOSE-CAPILLARY: 164 mg/dL — AB (ref 65–99)
GLUCOSE-CAPILLARY: 265 mg/dL — AB (ref 65–99)
GLUCOSE-CAPILLARY: 262 mg/dL — AB (ref 65–99)
GLUCOSE-CAPILLARY: 223 mg/dL — AB (ref 65–99)
Glucose-Capillary: 266 mg/dL — ABNORMAL HIGH (ref 65–99)
Glucose-Capillary: 301 mg/dL — ABNORMAL HIGH (ref 65–99)

## 2015-07-14 DIAGNOSIS — E119 Type 2 diabetes mellitus without complications: Secondary | ICD-10-CM | POA: Diagnosis not present

## 2015-07-14 LAB — GLUCOSE, CAPILLARY
GLUCOSE-CAPILLARY: 236 mg/dL — AB (ref 65–99)
Glucose-Capillary: 276 mg/dL — ABNORMAL HIGH (ref 65–99)
Glucose-Capillary: 263 mg/dL — ABNORMAL HIGH (ref 65–99)
Glucose-Capillary: 195 mg/dL — ABNORMAL HIGH (ref 65–99)

## 2015-07-15 DIAGNOSIS — E119 Type 2 diabetes mellitus without complications: Secondary | ICD-10-CM | POA: Diagnosis not present

## 2015-07-15 LAB — GLUCOSE, CAPILLARY
GLUCOSE-CAPILLARY: 322 mg/dL — AB (ref 65–99)
Glucose-Capillary: 232 mg/dL — ABNORMAL HIGH (ref 65–99)
Glucose-Capillary: 216 mg/dL — ABNORMAL HIGH (ref 65–99)
Glucose-Capillary: 190 mg/dL — ABNORMAL HIGH (ref 65–99)

## 2015-07-16 DIAGNOSIS — E119 Type 2 diabetes mellitus without complications: Secondary | ICD-10-CM | POA: Diagnosis not present

## 2015-07-16 LAB — GLUCOSE, CAPILLARY
GLUCOSE-CAPILLARY: 165 mg/dL — AB (ref 65–99)
GLUCOSE-CAPILLARY: 103 mg/dL — AB (ref 65–99)
Glucose-Capillary: 209 mg/dL — ABNORMAL HIGH (ref 65–99)
Glucose-Capillary: 108 mg/dL — ABNORMAL HIGH (ref 65–99)

## 2015-07-17 DIAGNOSIS — E119 Type 2 diabetes mellitus without complications: Secondary | ICD-10-CM | POA: Diagnosis not present

## 2015-07-17 LAB — GLUCOSE, CAPILLARY
GLUCOSE-CAPILLARY: 144 mg/dL — AB (ref 65–99)
Glucose-Capillary: 145 mg/dL — ABNORMAL HIGH (ref 65–99)

## 2015-07-18 ENCOUNTER — Emergency Department (HOSPITAL_COMMUNITY): Payer: Medicare Other

## 2015-07-18 ENCOUNTER — Emergency Department (HOSPITAL_COMMUNITY)
Admission: EM | Admit: 2015-07-18 | Discharge: 2015-07-18 | Disposition: A | Discharge status: Home / Self Care | Payer: Medicare Other | Attending: Emergency Medicine | Admitting: Emergency Medicine

## 2015-07-18 ENCOUNTER — Encounter (HOSPITAL_COMMUNITY): Payer: Self-pay

## 2015-07-18 DIAGNOSIS — R0789 Other chest pain: Secondary | ICD-10-CM | POA: Insufficient documentation

## 2015-07-18 DIAGNOSIS — Z794 Long term (current) use of insulin: Secondary | ICD-10-CM | POA: Diagnosis not present

## 2015-07-18 DIAGNOSIS — Z7902 Long term (current) use of antithrombotics/antiplatelets: Secondary | ICD-10-CM | POA: Insufficient documentation

## 2015-07-18 DIAGNOSIS — Z7982 Long term (current) use of aspirin: Secondary | ICD-10-CM | POA: Insufficient documentation

## 2015-07-18 DIAGNOSIS — Z951 Presence of aortocoronary bypass graft: Secondary | ICD-10-CM | POA: Insufficient documentation

## 2015-07-18 DIAGNOSIS — E119 Type 2 diabetes mellitus without complications: Secondary | ICD-10-CM | POA: Diagnosis not present

## 2015-07-18 DIAGNOSIS — I25119 Atherosclerotic heart disease of native coronary artery with unspecified angina pectoris: Secondary | ICD-10-CM | POA: Insufficient documentation

## 2015-07-18 DIAGNOSIS — Z79899 Other long term (current) drug therapy: Secondary | ICD-10-CM | POA: Diagnosis not present

## 2015-07-18 DIAGNOSIS — R079 Chest pain, unspecified: Secondary | ICD-10-CM

## 2015-07-18 DIAGNOSIS — Z8673 Personal history of transient ischemic attack (TIA), and cerebral infarction without residual deficits: Secondary | ICD-10-CM | POA: Diagnosis not present

## 2015-07-18 DIAGNOSIS — Z872 Personal history of diseases of the skin and subcutaneous tissue: Secondary | ICD-10-CM | POA: Diagnosis not present

## 2015-07-18 LAB — BASIC METABOLIC PANEL
Anion gap: 9 (ref 5–15)
BUN: 17 mg/dL (ref 6–20)
CALCIUM: 9.2 mg/dL (ref 8.9–10.3)
CO2: 23 mmol/L (ref 22–32)
CREATININE: 0.96 mg/dL (ref 0.44–1.00)
Chloride: 104 mmol/L (ref 101–111)
GFR calc Af Amer: >60 mL/min (ref >60)
GFR, EST NON AFRICAN AMERICAN: 56 mL/min — AB (ref >60)
GLUCOSE: 264 mg/dL — AB (ref 65–99)
POTASSIUM: 4.5 mmol/L (ref 3.5–5.1)
SODIUM: 136 mmol/L (ref 135–145)

## 2015-07-18 LAB — GLUCOSE, CAPILLARY
GLUCOSE-CAPILLARY: 167 mg/dL — AB (ref 65–99)
Glucose-Capillary: 81 mg/dL (ref 65–99)
Glucose-Capillary: 231 mg/dL — ABNORMAL HIGH (ref 65–99)
Glucose-Capillary: 240 mg/dL — ABNORMAL HIGH (ref 65–99)
Glucose-Capillary: 219 mg/dL — ABNORMAL HIGH (ref 65–99)

## 2015-07-18 LAB — I-STAT TROPONIN, ED: TROPONIN I, POC: 0.01 ng/mL (ref 0.00–0.08)

## 2015-07-18 LAB — CBC
HEMATOCRIT: 34.7 % — AB (ref 36.0–46.0)
Hemoglobin: 11.1 g/dL — ABNORMAL LOW (ref 12.0–15.0)
MCH: 29.8 pg (ref 26.0–34.0)
MCHC: 32 g/dL (ref 30.0–36.0)
MCV: 93.3 fL (ref 78.0–100.0)
PLATELETS: 177 10*3/uL (ref 150–400)
RBC: 3.72 MIL/uL — ABNORMAL LOW (ref 3.87–5.11)
RDW: 13.6 % (ref 11.5–15.5)
WBC: 6.2 10*3/uL (ref 4.0–10.5)

## 2015-07-18 NOTE — Discharge Instructions (Signed)
Continue your medications as before.  Follow-up with your cardiologist next week, and return to the ER if your symptoms significantly worsen or change.   Nonspecific Chest Pain  Chest pain can be caused by many different conditions. There is always a chance that your pain could be related to something serious, such as a heart attack or a blood clot in your lungs. Chest pain can also be caused by conditions that are not life-threatening. If you have chest pain, it is very important to follow up with your health care provider. CAUSES  Chest pain can be caused by:  Heartburn.  Pneumonia or bronchitis.  Anxiety or stress.  Inflammation around your heart (pericarditis) or lung (pleuritis or pleurisy).  A blood clot in your lung.  A collapsed lung (pneumothorax). It can develop suddenly on its own (spontaneous pneumothorax) or from trauma to the chest.  Shingles infection (varicella-zoster virus).  Heart attack.  Damage to the bones, muscles, and cartilage that make up your chest wall. This can include:  Bruised bones due to injury.  Strained muscles or cartilage due to frequent or repeated coughing or overwork.  Fracture to one or more ribs.  Sore cartilage due to inflammation (costochondritis). RISK FACTORS  Risk factors for chest pain may include:  Activities that increase your risk for trauma or injury to your chest.  Respiratory infections or conditions that cause frequent coughing.  Medical conditions or overeating that can cause heartburn.  Heart disease or family history of heart disease.  Conditions or health behaviors that increase your risk of developing a blood clot.  Having had chicken pox (varicella zoster). SIGNS AND SYMPTOMS Chest pain can feel like:  Burning or tingling on the surface of your chest or deep in your chest.  Crushing, pressure, aching, or squeezing pain.  Dull or sharp pain that is worse when you move, cough, or take a deep  breath.  Pain that is also felt in your back, neck, shoulder, or arm, or pain that spreads to any of these areas. Your chest pain may come and go, or it may stay constant. DIAGNOSIS Lab tests or other studies may be needed to find the cause of your pain. Your health care provider may have you take a test called an ambulatory ECG (electrocardiogram). An ECG records your heartbeat patterns at the time the test is performed. You may also have other tests, such as:  Transthoracic echocardiogram (TTE). During echocardiography, sound waves are used to create a picture of all of the heart structures and to look at how blood flows through your heart.  Transesophageal echocardiogram (TEE).This is a more advanced imaging test that obtains images from inside your body. It allows your health care provider to see your heart in finer detail.  Cardiac monitoring. This allows your health care provider to monitor your heart rate and rhythm in real time.  Holter monitor. This is a portable device that records your heartbeat and can help to diagnose abnormal heartbeats. It allows your health care provider to track your heart activity for several days, if needed.  Stress tests. These can be done through exercise or by taking medicine that makes your heart beat more quickly.  Blood tests.  Imaging tests. TREATMENT  Your treatment depends on what is causing your chest pain. Treatment may include:  Medicines. These may include:  Acid blockers for heartburn.  Anti-inflammatory medicine.  Pain medicine for inflammatory conditions.  Antibiotic medicine, if an infection is present.  Medicines to dissolve blood  clots.  Medicines to treat coronary artery disease.  Supportive care for conditions that do not require medicines. This may include:  Resting.  Applying heat or cold packs to injured areas.  Limiting activities until pain decreases. HOME CARE INSTRUCTIONS  If you were prescribed an  antibiotic medicine, finish it all even if you start to feel better.  Avoid any activities that bring on chest pain.  Do not use any tobacco products, including cigarettes, chewing tobacco, or electronic cigarettes. If you need help quitting, ask your health care provider.  Do not drink alcohol.  Take medicines only as directed by your health care provider.  Keep all follow-up visits as directed by your health care provider. This is important. This includes any further testing if your chest pain does not go away.  If heartburn is the cause for your chest pain, you may be told to keep your head raised (elevated) while sleeping. This reduces the chance that acid will go from your stomach into your esophagus.  Make lifestyle changes as directed by your health care provider. These may include:  Getting regular exercise. Ask your health care provider to suggest some activities that are safe for you.  Eating a heart-healthy diet. A registered dietitian can help you to learn healthy eating options.  Maintaining a healthy weight.  Managing diabetes, if necessary.  Reducing stress. SEEK MEDICAL CARE IF:  Your chest pain does not go away after treatment.  You have a rash with blisters on your chest.  You have a fever. SEEK IMMEDIATE MEDICAL CARE IF:   Your chest pain is worse.  You have an increasing cough, or you cough up blood.  You have severe abdominal pain.  You have severe weakness.  You faint.  You have chills.  You have sudden, unexplained chest discomfort.  You have sudden, unexplained discomfort in your arms, back, neck, or jaw.  You have shortness of breath at any time.  You suddenly start to sweat, or your skin gets clammy.  You feel nauseous or you vomit.  You suddenly feel light-headed or dizzy.  Your heart begins to beat quickly, or it feels like it is skipping beats. These symptoms may represent a serious problem that is an emergency. Do not wait to  see if the symptoms will go away. Get medical help right away. Call your local emergency services (911 in the [place] Do not drive yourself to the hospital.   This information is not intended to replace advice given to you by your health care provider. Make sure you discuss any questions you have with your health care provider.   Document Released: 05/20/2005 Document Revised: 08/31/2014 Document Reviewed: 03/16/2014 Elsevier Interactive Patient Education [employer].

## 2015-07-18 NOTE — ED Notes (Signed)
Patient verbalized understanding of discharge instructions and denies any further needs nor questions at this time. VS stable.  

## 2015-07-18 NOTE — ED Notes (Signed)
[facility] Was at her family's for Thanksgiving and when she returned to the facility She realized she was having intermittent chest pain throughout the day.  Around 18:00 the pain began to get sharp and radiate into her back.  They  Gave her SL nitro X 3 tabs and it helped little.   Presently she is having tightness. 4 /;10  Skins is warm , pink and dry. Alert and oriented X 4.  Pt. Denies any cold symptoms. Denies any coughs.

## 2015-07-18 NOTE — ED Provider Notes (Signed)
CSN: [id number]     Arrival date & time 07/18/15  1841 History   First MD Initiated Contact with Patient 07/18/15 1919     Chief Complaint  Patient presents with  . Chest Pain     (Consider location/radiation/quality/duration/timing/severity/associated sxs/prior Treatment) HPI Comments: Patient is a 74 year old female with history of coronary artery disease with CABG in 1997. She presents today with complaints of sharp pains in the center of her chest that started at approximately 4:30. She denies any shortness of breath, diaphoresis, nausea. She has taken several nitroglycerin's with some relief.  According to the patient and son, she has been told that she is a really medical management cardiac patient and that no intervention is possible. Her cardiologist is associated with [facility].  Patient is a 74 y.o. female presenting with chest pain. The history is provided by the patient.  Chest Pain Pain location:  Substernal area Pain quality: sharp   Pain radiates to:  Does not radiate Pain radiates to the back: yes   Pain severity:  Severe Onset quality:  Sudden Duration:  3 hours Timing:  Intermittent Progression:  Partially resolved Chronicity:  Recurrent Relieved by:  Nothing Worsened by:  Nothing tried Ineffective treatments:  None tried   Past Medical History  Diagnosis Date  . Coronary artery disease   . Stroke (HCC)   . Anginal pain (HCC)   . Psoriasis   . Diabetes mellitus without complication [facility](HCC)    Past Surgical History  Procedure Laterality Date  . Coronary artery bypass graft     Family History  Problem Relation Age of Onset  . Heart attack Father   . Diabetes Mother   . Cancer Mother   . Stroke Mother    Social History  Substance Use Topics  . Smoking status: Never Smoker   . Smokeless tobacco: None  . Alcohol Use: No   OB History    No data available     Review of Systems  Cardiovascular: Positive for chest pain.  All other systems  reviewed and are negative.     Allergies  Vicodin  Home Medications   Prior to Admission medications   Medication Sig Start Date End Date Taking? Authorizing Provider  acetaminophen (TYLENOL ARTHRITIS PAIN) 650 MG CR tablet Take 650-1,300 mg by mouth every 8 (eight) hours as needed for pain.    Historical Provider, MD  aspirin EC 81 MG tablet Take 81 mg by mouth at bedtime.    Historical Provider, MD  atorvastatin (LIPITOR) 40 MG tablet Take 1 tablet (40 mg total) by mouth at bedtime. 07/10/15   Richarda OverlieNayana Abrol, MD  bimatoprost (LUMIGAN) 0.01 % SOLN Place 1 drop into both eyes at bedtime.    Historical Provider, MD  brimonidine (ALPHAGAN) 0.2 % ophthalmic solution Place 1 drop into both eyes 2 (two) times daily.     Historical Provider, MD  CALCIUM PO Take 1 tablet by mouth daily.    Historical Provider, MD  clopidogrel (PLAVIX) 75 MG tablet Take 75 mg by mouth daily.    Historical Provider, MD  Insulin Glargine (LANTUS SOLOSTAR) 100 UNIT/ML Solostar Pen Inject 24 Units into the skin at bedtime.    Historical Provider, MD  Insulin Lispro Prot & Lispro (HUMALOG MIX 75/25 KWIKPEN) (75-25) 100 UNIT/ML Kwikpen Inject 6-12 Units into the skin 4 (four) times daily - after meals and at bedtime. Per sliding scale: 6 units plus:   CBG 150-200 add 1 unit, 201-250 add 2 units, 251-300 add  3 units, 301-350 add 4 units 351-400 add 5 units, >400 add 6 units and call MD    Historical Provider, MD  isosorbide mononitrate (IMDUR) 30 MG 24 hr tablet Take 0.5 tablets (15 mg total) by mouth daily. 07/10/15   Richarda Overlie, MD  levETIRAcetam (KEPPRA XR) 500 MG 24 hr tablet Take 1 tablet (500 mg total) by mouth daily. 07/10/15   Richarda Overlie, MD  MAGNESIUM PO Take 1 tablet by mouth daily.    Historical Provider, MD  Multiple Vitamins-Minerals (ZINC PO) Take 1 tablet by mouth daily.    Historical Provider, MD  nitroGLYCERIN (NITROSTAT) 0.4 MG SL tablet Place 0.4 mg under the tongue every 5 (five) minutes as needed for  chest pain.    Historical Provider, MD  nystatin cream (MYCOSTATIN) Apply 1 application topically 2 (two) times daily as needed (yeast infection).  04/25/15   Historical Provider, MD  OVER THE COUNTER MEDICATION Take 1 tablet by mouth daily as needed (sinus congestion). Coricidin hbp    Historical Provider, MD  pantoprazole (PROTONIX) 40 MG tablet Take 1 tablet (40 mg total) by mouth daily. 07/10/15   Richarda Overlie, MD  timolol (BETIMOL) 0.5 % ophthalmic solution Place 1 drop into both eyes 2 (two) times daily.    Historical Provider, MD  triamcinolone ointment (KENALOG) 0.1 % Apply 1 application topically 3 (three) times daily as needed (itching from psoriasis).    Historical Provider, MD  vitamin B-12 (CYANOCOBALAMIN) 1000 MCG tablet Take 1,000 mcg by mouth daily.    Historical Provider, MD  vitamin E 400 UNIT capsule Take 400 Units by mouth daily.    Historical Provider, MD   BP 150/61 mmHg  Pulse 74  Temp(Src) 98.9 F (37.2 C) (Oral)  Resp 15  SpO2 99% Physical Exam  Constitutional: She is oriented to person, place, and time. She appears well-developed and well-nourished. No distress.  HENT:  Head: Normocephalic and atraumatic.  Neck: Normal range of motion. Neck supple.  Cardiovascular: Normal rate and regular rhythm.  Exam reveals no gallop and no friction rub.   No murmur heard. Pulmonary/Chest: Effort normal and breath sounds normal. No respiratory distress. She has no wheezes. She exhibits tenderness.  There is tenderness to palpation of the anterior chest wall. This seems to reproduce her symptoms.  Abdominal: Soft. Bowel sounds are normal. She exhibits no distension. There is no tenderness.  Musculoskeletal: Normal range of motion.  Neurological: She is alert and oriented to person, place, and time.  Skin: Skin is warm and dry. She is not diaphoretic.  Nursing note and vitals reviewed.   ED Course  Procedures (including critical care time) Labs Review Labs Reviewed  CBC -  Abnormal; Notable for the following:    RBC 3.72 (*)    Hemoglobin 11.1 (*)    HCT 34.7 (*)    All other components within normal limits  BASIC METABOLIC PANEL    Imaging Review No results found. I have personally reviewed and evaluated these images and lab results as part of my medical decision-making.   EKG Interpretation   Date/Time:  Thursday July 18 2015 18:49:25 EST Ventricular Rate:  77 PR Interval:  141 QRS Duration: 123 QT Interval:  495 QTC Calculation: 560 R Axis:   102 Text Interpretation:  Sinus rhythm Nonspecific intraventricular conduction  delay Repol abnrm suggests ischemia, anterolateral Confirmed by Aolani Piggott  MD,  Jushua Waltman ([id number]) on 07/18/2015 7:35:37 PM      MDM   Final diagnoses:  None  Patient with a long standing history of coronary artery disease. She is followed by a cardiologist in [place]. She has had heart cath in the past, however has no targets for intervention and has been treated medically. She experienced chest pain this afternoon while spending Thanksgiving with her family. She had some relief with nitroglycerin. She was virtually pain-free by the time of her arrival here. Her troponin many hours after the onset of her discomfort is negative. She is now pain-free and her EKG is unchanged.  As she has not been a candidate for intervention in the past, I do not feel as though hospitalization or further intervention is indicated. I will discharge her to home with instructions to follow-up with her cardiologist next week, and return to the ER if symptoms significantly worsen or change.    Geoffery Lyons, MD 07/18/15 [phone]

## 2015-07-19 DIAGNOSIS — E119 Type 2 diabetes mellitus without complications: Secondary | ICD-10-CM | POA: Diagnosis not present

## 2015-07-19 LAB — GLUCOSE, CAPILLARY
Glucose-Capillary: 232 mg/dL — ABNORMAL HIGH (ref 65–99)
Glucose-Capillary: 194 mg/dL — ABNORMAL HIGH (ref 65–99)
Glucose-Capillary: 222 mg/dL — ABNORMAL HIGH (ref 65–99)
Glucose-Capillary: 131 mg/dL — ABNORMAL HIGH (ref 65–99)
Glucose-Capillary: 151 mg/dL — ABNORMAL HIGH (ref 65–99)

## 2015-07-20 DIAGNOSIS — E119 Type 2 diabetes mellitus without complications: Secondary | ICD-10-CM | POA: Diagnosis not present

## 2015-07-20 LAB — GLUCOSE, CAPILLARY
GLUCOSE-CAPILLARY: 124 mg/dL — AB (ref 65–99)
GLUCOSE-CAPILLARY: 148 mg/dL — AB (ref 65–99)
Glucose-Capillary: 195 mg/dL — ABNORMAL HIGH (ref 65–99)
Glucose-Capillary: 116 mg/dL — ABNORMAL HIGH (ref 65–99)

## 2015-07-21 DIAGNOSIS — E119 Type 2 diabetes mellitus without complications: Secondary | ICD-10-CM | POA: Diagnosis not present

## 2015-07-21 LAB — GLUCOSE, CAPILLARY
GLUCOSE-CAPILLARY: 164 mg/dL — AB (ref 65–99)
GLUCOSE-CAPILLARY: 140 mg/dL — AB (ref 65–99)
GLUCOSE-CAPILLARY: 145 mg/dL — AB (ref 65–99)
Glucose-Capillary: 97 mg/dL (ref 65–99)

## 2015-07-22 DIAGNOSIS — E119 Type 2 diabetes mellitus without complications: Secondary | ICD-10-CM | POA: Diagnosis not present

## 2015-07-22 LAB — GLUCOSE, CAPILLARY
GLUCOSE-CAPILLARY: 98 mg/dL (ref 65–99)
Glucose-Capillary: 66 mg/dL (ref 65–99)
Glucose-Capillary: 129 mg/dL — ABNORMAL HIGH (ref 65–99)

## 2015-07-23 DIAGNOSIS — E119 Type 2 diabetes mellitus without complications: Secondary | ICD-10-CM | POA: Diagnosis not present

## 2015-07-23 LAB — GLUCOSE, CAPILLARY
Glucose-Capillary: 110 mg/dL — ABNORMAL HIGH (ref 65–99)
Glucose-Capillary: 125 mg/dL — ABNORMAL HIGH (ref 65–99)
Glucose-Capillary: 86 mg/dL (ref 65–99)

## 2015-07-24 DIAGNOSIS — E119 Type 2 diabetes mellitus without complications: Secondary | ICD-10-CM | POA: Diagnosis not present

## 2015-07-24 LAB — GLUCOSE, CAPILLARY
GLUCOSE-CAPILLARY: 208 mg/dL — AB (ref 65–99)
Glucose-Capillary: 190 mg/dL — ABNORMAL HIGH (ref 65–99)
Glucose-Capillary: 127 mg/dL — ABNORMAL HIGH (ref 65–99)
Glucose-Capillary: 163 mg/dL — ABNORMAL HIGH (ref 65–99)

## 2015-07-25 ENCOUNTER — Encounter
Admission: RE | Admit: 2015-07-25 | Discharge: 2015-07-25 | Disposition: A | Discharge status: Home / Self Care | Payer: Medicare Other | Source: Ambulatory Visit | Attending: Internal Medicine | Admitting: Internal Medicine

## 2015-07-25 DIAGNOSIS — Z794 Long term (current) use of insulin: Secondary | ICD-10-CM | POA: Insufficient documentation

## 2015-07-25 DIAGNOSIS — E119 Type 2 diabetes mellitus without complications: Secondary | ICD-10-CM | POA: Insufficient documentation

## 2015-07-25 LAB — GLUCOSE, CAPILLARY
GLUCOSE-CAPILLARY: 171 mg/dL — AB (ref 65–99)
GLUCOSE-CAPILLARY: 136 mg/dL — AB (ref 65–99)
Glucose-Capillary: 234 mg/dL — ABNORMAL HIGH (ref 65–99)
Glucose-Capillary: 144 mg/dL — ABNORMAL HIGH (ref 65–99)

## 2015-07-26 DIAGNOSIS — E119 Type 2 diabetes mellitus without complications: Secondary | ICD-10-CM | POA: Diagnosis not present

## 2015-07-26 LAB — GLUCOSE, CAPILLARY
GLUCOSE-CAPILLARY: 115 mg/dL — AB (ref 65–99)
Glucose-Capillary: 113 mg/dL — ABNORMAL HIGH (ref 65–99)

## 2015-07-27 DIAGNOSIS — E119 Type 2 diabetes mellitus without complications: Secondary | ICD-10-CM | POA: Diagnosis not present

## 2015-07-27 LAB — GLUCOSE, CAPILLARY
GLUCOSE-CAPILLARY: 92 mg/dL (ref 65–99)
GLUCOSE-CAPILLARY: 125 mg/dL — AB (ref 65–99)

## 2015-07-28 LAB — GLUCOSE, CAPILLARY
GLUCOSE-CAPILLARY: 43 mg/dL — AB (ref 65–99)
GLUCOSE-CAPILLARY: 146 mg/dL — AB (ref 65–99)
GLUCOSE-CAPILLARY: 197 mg/dL — AB (ref 65–99)
GLUCOSE-CAPILLARY: 214 mg/dL — AB (ref 65–99)
Glucose-Capillary: 135 mg/dL — ABNORMAL HIGH (ref 65–99)
Glucose-Capillary: 103 mg/dL — ABNORMAL HIGH (ref 65–99)
Glucose-Capillary: 177 mg/dL — ABNORMAL HIGH (ref 65–99)
Glucose-Capillary: 113 mg/dL — ABNORMAL HIGH (ref 65–99)

## 2015-07-29 DIAGNOSIS — E119 Type 2 diabetes mellitus without complications: Secondary | ICD-10-CM | POA: Diagnosis not present

## 2015-07-29 LAB — GLUCOSE, CAPILLARY
Glucose-Capillary: 106 mg/dL — ABNORMAL HIGH (ref 65–99)
Glucose-Capillary: 90 mg/dL (ref 65–99)
Glucose-Capillary: 133 mg/dL — ABNORMAL HIGH (ref 65–99)
Glucose-Capillary: 124 mg/dL — ABNORMAL HIGH (ref 65–99)
Glucose-Capillary: 193 mg/dL — ABNORMAL HIGH (ref 65–99)

## 2015-07-30 LAB — GLUCOSE, CAPILLARY
GLUCOSE-CAPILLARY: 89 mg/dL (ref 65–99)
GLUCOSE-CAPILLARY: 76 mg/dL (ref 65–99)
GLUCOSE-CAPILLARY: 116 mg/dL — AB (ref 65–99)
Glucose-Capillary: 203 mg/dL — ABNORMAL HIGH (ref 65–99)
Glucose-Capillary: 155 mg/dL — ABNORMAL HIGH (ref 65–99)

## 2015-07-31 DIAGNOSIS — E119 Type 2 diabetes mellitus without complications: Secondary | ICD-10-CM | POA: Diagnosis not present

## 2015-08-01 LAB — GLUCOSE, CAPILLARY
GLUCOSE-CAPILLARY: 149 mg/dL — AB (ref 65–99)
GLUCOSE-CAPILLARY: 156 mg/dL — AB (ref 65–99)

## 2015-09-24 ENCOUNTER — Ambulatory Visit: Payer: Self-pay | Admitting: Neurology

## 2015-10-22 ENCOUNTER — Encounter (HOSPITAL_COMMUNITY): Payer: Self-pay | Admitting: Emergency Medicine

## 2015-10-22 ENCOUNTER — Inpatient Hospital Stay (HOSPITAL_COMMUNITY)
Admission: EM | Admit: 2015-10-22 | Discharge: 2015-10-24 | DRG: 281 | Disposition: A | Discharge status: Home / Self Care | Payer: Medicare Other | Attending: Internal Medicine | Admitting: Internal Medicine

## 2015-10-22 DIAGNOSIS — Z9842 Cataract extraction status, left eye: Secondary | ICD-10-CM | POA: Diagnosis not present

## 2015-10-22 DIAGNOSIS — E118 Type 2 diabetes mellitus with unspecified complications: Secondary | ICD-10-CM | POA: Diagnosis not present

## 2015-10-22 DIAGNOSIS — I2510 Atherosclerotic heart disease of native coronary artery without angina pectoris: Secondary | ICD-10-CM | POA: Diagnosis present

## 2015-10-22 DIAGNOSIS — G40209 Localization-related (focal) (partial) symptomatic epilepsy and epileptic syndromes with complex partial seizures, not intractable, without status epilepticus: Secondary | ICD-10-CM | POA: Diagnosis present

## 2015-10-22 DIAGNOSIS — Z888 Allergy status to other drugs, medicaments and biological substances status: Secondary | ICD-10-CM

## 2015-10-22 DIAGNOSIS — Z23 Encounter for immunization: Secondary | ICD-10-CM

## 2015-10-22 DIAGNOSIS — Z961 Presence of intraocular lens: Secondary | ICD-10-CM | POA: Diagnosis present

## 2015-10-22 DIAGNOSIS — K219 Gastro-esophageal reflux disease without esophagitis: Secondary | ICD-10-CM | POA: Diagnosis present

## 2015-10-22 DIAGNOSIS — Z833 Family history of diabetes mellitus: Secondary | ICD-10-CM

## 2015-10-22 DIAGNOSIS — Z90710 Acquired absence of both cervix and uterus: Secondary | ICD-10-CM

## 2015-10-22 DIAGNOSIS — E7800 Pure hypercholesterolemia, unspecified: Secondary | ICD-10-CM | POA: Diagnosis present

## 2015-10-22 DIAGNOSIS — Z7982 Long term (current) use of aspirin: Secondary | ICD-10-CM | POA: Diagnosis not present

## 2015-10-22 DIAGNOSIS — L409 Psoriasis, unspecified: Secondary | ICD-10-CM | POA: Diagnosis present

## 2015-10-22 DIAGNOSIS — Z8249 Family history of ischemic heart disease and other diseases of the circulatory system: Secondary | ICD-10-CM

## 2015-10-22 DIAGNOSIS — I200 Unstable angina: Secondary | ICD-10-CM | POA: Diagnosis present

## 2015-10-22 DIAGNOSIS — E1151 Type 2 diabetes mellitus with diabetic peripheral angiopathy without gangrene: Secondary | ICD-10-CM | POA: Diagnosis present

## 2015-10-22 DIAGNOSIS — I25110 Atherosclerotic heart disease of native coronary artery with unstable angina pectoris: Secondary | ICD-10-CM

## 2015-10-22 DIAGNOSIS — Z885 Allergy status to narcotic agent status: Secondary | ICD-10-CM | POA: Diagnosis not present

## 2015-10-22 DIAGNOSIS — I110 Hypertensive heart disease with heart failure: Secondary | ICD-10-CM | POA: Diagnosis present

## 2015-10-22 DIAGNOSIS — Z66 Do not resuscitate: Secondary | ICD-10-CM | POA: Diagnosis present

## 2015-10-22 DIAGNOSIS — Z794 Long term (current) use of insulin: Secondary | ICD-10-CM | POA: Diagnosis not present

## 2015-10-22 DIAGNOSIS — I252 Old myocardial infarction: Secondary | ICD-10-CM

## 2015-10-22 DIAGNOSIS — G40A09 Absence epileptic syndrome, not intractable, without status epilepticus: Secondary | ICD-10-CM

## 2015-10-22 DIAGNOSIS — R531 Weakness: Secondary | ICD-10-CM | POA: Diagnosis present

## 2015-10-22 DIAGNOSIS — Z79899 Other long term (current) drug therapy: Secondary | ICD-10-CM | POA: Diagnosis not present

## 2015-10-22 DIAGNOSIS — Z9841 Cataract extraction status, right eye: Secondary | ICD-10-CM

## 2015-10-22 DIAGNOSIS — R079 Chest pain, unspecified: Secondary | ICD-10-CM | POA: Diagnosis not present

## 2015-10-22 DIAGNOSIS — I5030 Unspecified diastolic (congestive) heart failure: Secondary | ICD-10-CM | POA: Diagnosis present

## 2015-10-22 DIAGNOSIS — F4489 Other dissociative and conversion disorders: Secondary | ICD-10-CM | POA: Diagnosis present

## 2015-10-22 DIAGNOSIS — G8194 Hemiplegia, unspecified affecting left nondominant side: Secondary | ICD-10-CM | POA: Diagnosis present

## 2015-10-22 DIAGNOSIS — Z7902 Long term (current) use of antithrombotics/antiplatelets: Secondary | ICD-10-CM

## 2015-10-22 DIAGNOSIS — Z951 Presence of aortocoronary bypass graft: Secondary | ICD-10-CM | POA: Diagnosis not present

## 2015-10-22 DIAGNOSIS — Z8673 Personal history of transient ischemic attack (TIA), and cerebral infarction without residual deficits: Secondary | ICD-10-CM | POA: Diagnosis not present

## 2015-10-22 DIAGNOSIS — G40219 Localization-related (focal) (partial) symptomatic epilepsy and epileptic syndromes with complex partial seizures, intractable, without status epilepticus: Secondary | ICD-10-CM | POA: Diagnosis not present

## 2015-10-22 DIAGNOSIS — I214 Non-ST elevation (NSTEMI) myocardial infarction: Secondary | ICD-10-CM | POA: Diagnosis present

## 2015-10-22 HISTORY — DX: Unspecified osteoarthritis, unspecified site: M19.90

## 2015-10-22 HISTORY — DX: Gastro-esophageal reflux disease without esophagitis: K21.9

## 2015-10-22 HISTORY — DX: Arthropathic psoriasis, unspecified: L40.50

## 2015-10-22 HISTORY — DX: Unspecified convulsions: R56.9

## 2015-10-22 HISTORY — DX: Transient cerebral ischemic attack, unspecified: G45.9

## 2015-10-22 HISTORY — DX: Type 2 diabetes mellitus without complications: E11.9

## 2015-10-22 HISTORY — DX: Other forms of angina pectoris: I20.8

## 2015-10-22 HISTORY — DX: Non-ST elevation (NSTEMI) myocardial infarction: I21.4

## 2015-10-22 HISTORY — DX: Acute myocardial infarction, unspecified: I21.9

## 2015-10-22 HISTORY — DX: Essential (primary) hypertension: I10

## 2015-10-22 HISTORY — DX: Pure hypercholesterolemia, unspecified: E78.00

## 2015-10-22 LAB — CBC WITH DIFFERENTIAL/PLATELET
BASOS PCT: 0 %
Basophils Absolute: 0 10*3/uL (ref 0.0–0.1)
EOS ABS: 0.2 10*3/uL (ref 0.0–0.7)
EOS PCT: 3 %
HEMATOCRIT: 36.5 % (ref 36.0–46.0)
Hemoglobin: 11.3 g/dL — ABNORMAL LOW (ref 12.0–15.0)
Lymphocytes Relative: 34 %
Lymphs Abs: 1.9 10*3/uL (ref 0.7–4.0)
MCH: 28 pg (ref 26.0–34.0)
MCHC: 31 g/dL (ref 30.0–36.0)
MCV: 90.6 fL (ref 78.0–100.0)
Monocytes Absolute: 0.5 10*3/uL (ref 0.1–1.0)
Monocytes Relative: 9 %
NEUTROS PCT: 54 %
Neutro Abs: 3.1 10*3/uL (ref 1.7–7.7)
Platelets: 179 10*3/uL (ref 150–400)
RBC: 4.03 MIL/uL (ref 3.87–5.11)
RDW: 14.4 % (ref 11.5–15.5)
WBC: 5.7 10*3/uL (ref 4.0–10.5)

## 2015-10-22 LAB — TROPONIN I: Troponin I: 0.23 ng/mL — ABNORMAL HIGH (ref <0.031)

## 2015-10-22 LAB — BASIC METABOLIC PANEL
ANION GAP: 13 (ref 5–15)
BUN: 12 mg/dL (ref 6–20)
CALCIUM: 9.5 mg/dL (ref 8.9–10.3)
CO2: 22 mmol/L (ref 22–32)
Chloride: 105 mmol/L (ref 101–111)
Creatinine, Ser: 0.82 mg/dL (ref 0.44–1.00)
GFR calc Af Amer: >60 mL/min (ref >60)
GFR calc non Af Amer: >60 mL/min (ref >60)
GLUCOSE: 224 mg/dL — AB (ref 65–99)
Potassium: 4.9 mmol/L (ref 3.5–5.1)
Sodium: 140 mmol/L (ref 135–145)

## 2015-10-22 LAB — GLUCOSE, CAPILLARY: Glucose-Capillary: 161 mg/dL — ABNORMAL HIGH (ref 65–99)

## 2015-10-22 LAB — I-STAT TROPONIN, ED: Troponin i, poc: 0.13 ng/mL (ref 0.00–0.08)

## 2015-10-22 MED ORDER — VITAMIN E 180 MG (400 UNIT) PO CAPS
400.0000 [IU] | ORAL_CAPSULE | Freq: Every day | ORAL | Status: DC
  Administered 2015-10-23 – 2015-10-24 (×2): 400 [IU] via ORAL
  Filled 2015-10-22 (×3): qty 1

## 2015-10-22 MED ORDER — ONDANSETRON HCL 4 MG PO TABS: 4.0000 mg | ORAL_TABLET | Freq: Four times a day (QID) | ORAL | Status: DC | PRN

## 2015-10-22 MED ORDER — HEPARIN BOLUS VIA INFUSION
4000.0000 [IU] | Freq: Once | INTRAVENOUS | Status: AC
  Administered 2015-10-22: 4000 [IU] via INTRAVENOUS
  Filled 2015-10-22: qty 4000

## 2015-10-22 MED ORDER — BRIMONIDINE TARTRATE 0.2 % OP SOLN
1.0000 [drp] | Freq: Two times a day (BID) | OPHTHALMIC | Status: DC
  Administered 2015-10-22 – 2015-10-24 (×4): 1 [drp] via OPHTHALMIC
  Filled 2015-10-22: qty 5

## 2015-10-22 MED ORDER — ASPIRIN 81 MG PO CHEW
324.0000 mg | CHEWABLE_TABLET | Freq: Once | ORAL | Status: AC
  Administered 2015-10-22: 324 mg via ORAL
  Filled 2015-10-22: qty 4

## 2015-10-22 MED ORDER — MAGNESIUM OXIDE 400 (241.3 MG) MG PO TABS
400.0000 mg | ORAL_TABLET | Freq: Every day | ORAL | Status: DC
  Administered 2015-10-23 – 2015-10-24 (×2): 400 mg via ORAL
  Filled 2015-10-22 (×2): qty 1

## 2015-10-22 MED ORDER — LATANOPROST 0.005 % OP SOLN
1.0000 [drp] | Freq: Every day | OPHTHALMIC | Status: DC
  Administered 2015-10-22 – 2015-10-23 (×2): 1 [drp] via OPHTHALMIC
  Filled 2015-10-22: qty 2.5

## 2015-10-22 MED ORDER — PANTOPRAZOLE SODIUM 40 MG PO TBEC
40.0000 mg | DELAYED_RELEASE_TABLET | Freq: Every day | ORAL | Status: DC
  Administered 2015-10-23 – 2015-10-24 (×2): 40 mg via ORAL
  Filled 2015-10-22 (×2): qty 1

## 2015-10-22 MED ORDER — NITROGLYCERIN 2 % TD OINT
0.5000 [in_us] | TOPICAL_OINTMENT | Freq: Four times a day (QID) | TRANSDERMAL | Status: DC
  Administered 2015-10-23 – 2015-10-24 (×5): 0.5 [in_us] via TOPICAL
  Filled 2015-10-22: qty 0.5

## 2015-10-22 MED ORDER — ISOSORBIDE MONONITRATE ER 60 MG PO TB24
60.0000 mg | ORAL_TABLET | Freq: Every day | ORAL | Status: DC
  Administered 2015-10-22: 60 mg via ORAL
  Filled 2015-10-22 (×2): qty 1

## 2015-10-22 MED ORDER — TIMOLOL MALEATE 0.5 % OP SOLN
1.0000 [drp] | Freq: Two times a day (BID) | OPHTHALMIC | Status: DC
  Administered 2015-10-22 – 2015-10-24 (×4): 1 [drp] via OPHTHALMIC
  Filled 2015-10-22: qty 5

## 2015-10-22 MED ORDER — LEVETIRACETAM 500 MG/5ML IV SOLN
750.0000 mg | Freq: Once | INTRAVENOUS | Status: AC
  Administered 2015-10-22: 750 mg via INTRAVENOUS
  Filled 2015-10-22: qty 7.5

## 2015-10-22 MED ORDER — INSULIN ASPART 100 UNIT/ML ~~LOC~~ SOLN
0.0000 [IU] | Freq: Three times a day (TID) | SUBCUTANEOUS | Status: DC
  Administered 2015-10-23: 3 [IU] via SUBCUTANEOUS
  Administered 2015-10-23: 5 [IU] via SUBCUTANEOUS
  Administered 2015-10-23: 2 [IU] via SUBCUTANEOUS
  Administered 2015-10-24: 5 [IU] via SUBCUTANEOUS

## 2015-10-22 MED ORDER — SODIUM CHLORIDE 0.9 % IV SOLN
750.0000 mg | Freq: Two times a day (BID) | INTRAVENOUS | Status: DC
  Filled 2015-10-22: qty 7.5

## 2015-10-22 MED ORDER — INSULIN GLARGINE 100 UNIT/ML ~~LOC~~ SOLN
24.0000 [IU] | Freq: Every day | SUBCUTANEOUS | Status: DC
  Administered 2015-10-22 – 2015-10-23 (×2): 24 [IU] via SUBCUTANEOUS
  Filled 2015-10-22 (×3): qty 0.24

## 2015-10-22 MED ORDER — ATORVASTATIN CALCIUM 20 MG PO TABS
20.0000 mg | ORAL_TABLET | Freq: Every day | ORAL | Status: DC
  Administered 2015-10-22 – 2015-10-23 (×2): 20 mg via ORAL
  Filled 2015-10-22 (×2): qty 1

## 2015-10-22 MED ORDER — ASPIRIN EC 81 MG PO TBEC
81.0000 mg | DELAYED_RELEASE_TABLET | Freq: Every day | ORAL | Status: DC
  Administered 2015-10-23: 81 mg via ORAL
  Filled 2015-10-22: qty 1

## 2015-10-22 MED ORDER — ACETAMINOPHEN 650 MG RE SUPP: 650.0000 mg | Freq: Four times a day (QID) | RECTAL | Status: DC | PRN

## 2015-10-22 MED ORDER — ENOXAPARIN SODIUM 40 MG/0.4ML ~~LOC~~ SOLN: 40.0000 mg | SUBCUTANEOUS | Status: DC

## 2015-10-22 MED ORDER — CLOPIDOGREL BISULFATE 75 MG PO TABS
75.0000 mg | ORAL_TABLET | Freq: Every day | ORAL | Status: DC
  Administered 2015-10-23 – 2015-10-24 (×2): 75 mg via ORAL
  Filled 2015-10-22 (×2): qty 1

## 2015-10-22 MED ORDER — SODIUM CHLORIDE 0.9% FLUSH
3.0000 mL | Freq: Two times a day (BID) | INTRAVENOUS | Status: DC
  Administered 2015-10-23: 3 mL via INTRAVENOUS

## 2015-10-22 MED ORDER — ZINC 15 MG PO CAPS: ORAL_CAPSULE | Freq: Every day | ORAL | Status: DC

## 2015-10-22 MED ORDER — INSULIN ASPART 100 UNIT/ML ~~LOC~~ SOLN
0.0000 [IU] | Freq: Every day | SUBCUTANEOUS | Status: DC
  Administered 2015-10-23: 2 [IU] via SUBCUTANEOUS

## 2015-10-22 MED ORDER — VITAMIN B-12 1000 MCG PO TABS
1000.0000 ug | ORAL_TABLET | Freq: Every day | ORAL | Status: DC
  Administered 2015-10-23 – 2015-10-24 (×2): 1000 ug via ORAL
  Filled 2015-10-22 (×2): qty 1

## 2015-10-22 MED ORDER — NITROGLYCERIN 0.4 MG SL SUBL
0.4000 mg | SUBLINGUAL_TABLET | SUBLINGUAL | Status: DC | PRN
  Administered 2015-10-23 (×2): 0.4 mg via SUBLINGUAL
  Filled 2015-10-22: qty 1

## 2015-10-22 MED ORDER — METOPROLOL TARTRATE 25 MG PO TABS
25.0000 mg | ORAL_TABLET | Freq: Every day | ORAL | Status: DC
  Administered 2015-10-22 – 2015-10-23 (×2): 25 mg via ORAL
  Filled 2015-10-22 (×2): qty 1

## 2015-10-22 MED ORDER — ACETAMINOPHEN 325 MG PO TABS: 650.0000 mg | ORAL_TABLET | Freq: Four times a day (QID) | ORAL | Status: DC | PRN

## 2015-10-22 MED ORDER — HEPARIN (PORCINE) IN NACL 100-0.45 UNIT/ML-% IJ SOLN
1050.0000 [IU]/h | INTRAMUSCULAR | Status: DC
  Administered 2015-10-22: 850 [IU]/h via INTRAVENOUS
  Administered 2015-10-23: 1050 [IU]/h via INTRAVENOUS
  Filled 2015-10-22 (×2): qty 250

## 2015-10-22 MED ORDER — ONDANSETRON HCL 4 MG/2ML IJ SOLN: 4.0000 mg | Freq: Four times a day (QID) | INTRAMUSCULAR | Status: DC | PRN

## 2015-10-22 MED ORDER — LEVETIRACETAM 500 MG PO TABS
1000.0000 mg | ORAL_TABLET | Freq: Two times a day (BID) | ORAL | Status: DC
  Administered 2015-10-23 – 2015-10-24 (×4): 1000 mg via ORAL
  Filled 2015-10-22 (×4): qty 2

## 2015-10-22 MED ORDER — INFLUENZA VAC SPLIT QUAD 0.5 ML IM SUSY
0.5000 mL | PREFILLED_SYRINGE | INTRAMUSCULAR | Status: AC
  Administered 2015-10-23: 0.5 mL via INTRAMUSCULAR
  Filled 2015-10-22: qty 0.5

## 2015-10-22 NOTE — H&P (Signed)
[facility] History and Physical  Leah Small WUJ:[id number] DOB: 1941-03-17 DOA: 10/22/2015  Referring physician: Dr. 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 Patient since has been following with neurologist Dr. Betsey Holiday at [facility]. She had 4 hour EEG done in January 2017 which for his note was unremarkable (however patient and her son report that she had seizure-like activity during the EEG). Her Keppra dose was then increased to 750 mg twice a day. Regarding her extensive coronary artery disease it has been recommended that patient is not a candidate for redo CABG or PCI and needs optimal medical management. Her metoprolol dose was increased recently by her cardiologist (weeks back). Patient was in the car with her son this afternoon when she felt she has some vague sensation and felt like  she was "giving away.". She then felt weakness in her left arm and shoulder with some pain that radiated to the back of her left neck. This lasted for several minutes. Denied any bowel or urinary incontinence or shaking. Denied any loss of consciousness. Once it resolved she was very scared and then started having some substernal chest pain almost 10/10 in severity lasting for a few minutes. EMS was called and she was given nitroglycerin after which symptoms improved to 5/10. She denied any nausea, vomiting, headache, blurred vision, dizziness, fever, chills, palpitations, shortness of breath, abdominal pain, bowel or urinary symptoms. Denied any sick contacts  or recent travel. Denies change in weight or appetite She reports that she has been compliant with all her medications and taking them on time.  In the ED Vitals were stable. Blood work done showed hemoglobin of 11.3 , normal chemistry and blood glucose of 224. Patient was given Nitropaste after which symptoms subsided. He received full dose aspirin. Patient was also given a loading dose of Keppra 750 mg. Troponin was elevated to 0.13 and EKG showed subtle ST elevation in anterior leads.  hospitalist admission requested to telemetry for NSTEMI and possible complex partial seizures. Cardiology and neuro hospitalist consulted.   Review of Systems:  Constitutional: Denies fever, chills, diaphoresis, appetite change and fatigue.  HEENT: Denies visual or hearing symptoms, congestion, sore throat, difficulty swallowing, neck stiffness, left-sided neck pain Respiratory: Denies SOB, DOE, cough, chest tightness,  and wheezing.   Cardiovascular: chest pain, denies palpitations and leg swelling.  Gastrointestinal: Denies nausea, vomiting, abdominal pain, diarrhea, constipation, blood in stool and abdominal distention.  Genitourinary: Denies dysuria, urgency, frequency, hematuria, flank pain and difficulty urinating.  Endocrine: Denies: hot or cold intolerance, sweats,, polyuria, polydipsia. Musculoskeletal: Denies myalgias, back pain, joint swelling, arthralgias and gait problem.  Skin: Denies pallor, rash and wound.  Neurological: seizures, Denies dizziness,  syncope, weakness, light-headedness, numbness and headaches.  Hematological: Denies adenopathy.  Psychiatric/Behavioral: Denies confusion   Past Medical History  Diagnosis Date  . Coronary artery disease   . Stroke (HCC)   . Anginal pain (HCC)   . Psoriasis   . Diabetes mellitus without complication (HCC)   . Acid reflux    Past Surgical History  Procedure Laterality Date  . Coronary artery  bypass graft     Social History:  reports  that she has never smoked. She does not have any smokeless tobacco history on file. She reports that she does not drink alcohol or use illicit drugs.  Allergies  Allergen Reactions  . Vicodin [Hydrocodone-Acetaminophen] Nausea And Vomiting  . Talwin [Pentazocine]   . Valium [Diazepam] Other (See Comments)    Makes her feel like she's flying    Family History  Problem Relation Age of Onset  . Heart attack Father   . Diabetes Mother   . Cancer Mother   . Stroke Mother     Prior to Admission medications   Medication Sig Start Date End Date Taking? Authorizing Provider  aspirin EC 81 MG tablet Take 81 mg by mouth at bedtime.   Yes Historical Provider, MD  atorvastatin (LIPITOR) 40 MG tablet Take 1 tablet (40 mg total) by mouth at bedtime. Patient taking differently: Take 20 mg by mouth at bedtime.  07/10/15  Yes Richarda Overlie, MD  bimatoprost (LUMIGAN) 0.01 % SOLN Place 1 drop into both eyes at bedtime.   Yes Historical Provider, MD  brimonidine (ALPHAGAN) 0.2 % ophthalmic solution Place 1 drop into both eyes 2 (two) times daily.    Yes Historical Provider, MD  CALCIUM PO Take 1 tablet by mouth daily.   Yes Historical Provider, MD  clopidogrel (PLAVIX) 75 MG tablet Take 75 mg by mouth daily.   Yes Historical Provider, MD  glipiZIDE (GLUCOTROL XL) 10 MG 24 hr tablet Take 10 mg by mouth every morning. 04/26/15  Yes Historical Provider, MD  Insulin Glargine (LANTUS SOLOSTAR) 100 UNIT/ML Solostar Pen Inject 24 Units into the skin at bedtime.   Yes Historical Provider, MD  Insulin Lispro Prot & Lispro (HUMALOG MIX 75/25 KWIKPEN) (75-25) 100 UNIT/ML Kwikpen Inject 6-12 Units into the skin 4 (four) times daily - after meals and at bedtime. Per sliding scale: 6 units plus:   CBG 150-200 add 1 unit, 201-250 add 2 units, 251-300 add 3 units, 301-350 add 4 units 351-400 add 5 units, >400 add 6 units and call MD   Yes Historical Provider, MD  isosorbide mononitrate (IMDUR) 60 MG 24 hr tablet Take 60 mg by  mouth daily.   Yes Historical Provider, MD  levETIRAcetam (KEPPRA XR) 500 MG 24 hr tablet Take 1 tablet (500 mg total) by mouth daily. Patient taking differently: Take 500 mg by mouth 2 (two) times daily.  07/10/15  Yes Richarda Overlie, MD  MAGNESIUM PO Take 1 tablet by mouth daily.   Yes Historical Provider, MD  metFORMIN (GLUCOPHAGE) 1000 MG tablet Take 1,000 mg by mouth 2 (two) times daily. 04/25/15  Yes Historical Provider, MD  metoprolol tartrate (LOPRESSOR) 25 MG tablet Take 25 mg by mouth at bedtime.   Yes Historical Provider, MD  Multiple Vitamins-Minerals (ZINC PO) Take 1 tablet by mouth daily.   Yes Historical Provider, MD  pantoprazole (PROTONIX) 40 MG tablet Take 1 tablet (40 mg total) by mouth daily. 07/10/15  Yes Richarda Overlie, MD  timolol (BETIMOL) 0.5 % ophthalmic solution Place 1 drop into both eyes 2 (two) times daily.   Yes Historical Provider, MD  triamcinolone ointment (KENALOG) 0.1 % Apply 1 application topically 3 (three) times daily as needed (itching from psoriasis).   Yes Historical Provider, MD  vitamin B-12 (CYANOCOBALAMIN) 1000 MCG tablet Take 1,000 mcg by mouth daily.   Yes Historical Provider, MD  vitamin E 400 UNIT capsule Take 400 Units by mouth daily.  Yes Historical Provider, MD  acetaminophen (TYLENOL ARTHRITIS PAIN) 650 MG CR tablet Take 650-1,300 mg by mouth every 8 (eight) hours as needed for pain.    Historical Provider, MD  isosorbide mononitrate (IMDUR) 30 MG 24 hr tablet Take 0.5 tablets (15 mg total) by mouth daily. Patient not taking: Reported on 10/22/2015 07/10/15   Richarda Overlie, MD  nitroGLYCERIN (NITROSTAT) 0.4 MG SL tablet Place 0.4 mg under the tongue every 5 (five) minutes as needed for chest pain.    Historical Provider, MD  nystatin cream (MYCOSTATIN) Apply 1 application topically 2 (two) times daily as needed (yeast infection).  04/25/15   Historical Provider, MD     Physical Exam:  Filed Vitals:   10/22/15 1600 10/22/15 1615 10/22/15 1645  10/22/15 1715  BP: 120/86 128/101 125/63 120/62  Pulse: 76 76 68 70  Temp:      TempSrc:      Resp: 16 25 20 17   SpO2: 99% 97% 96% 98%    Constitutional: Vital signs reviewed.  Elderly female not in distress HEENT: no pallor, no icterus, moist oral mucosa, no cervical lymphadenopathy, bilateral anterior cheek rash Cardiovascular: RRR, S1 normal, S2 normal, no MRG Chest: CTAB, no wheezes, rales, or rhonchi Abdominal: Soft. Non-tender, non-distended, bowel sounds are normal,  Ext: warm, trace edema Neurological: A&O x3, non focal  Labs on Admission:  Basic Metabolic Panel:  Recent Labs Lab 10/22/15 1557  NA 140  K 4.9  CL 105  CO2 22  GLUCOSE 224*  BUN 12  CREATININE 0.82  CALCIUM 9.5   Liver Function Tests: No results for input(s): AST, ALT, ALKPHOS, BILITOT, PROT, ALBUMIN in the last 168 hours. No results for input(s): LIPASE, AMYLASE in the last 168 hours. No results for input(s): AMMONIA in the last 168 hours. CBC:  Recent Labs Lab 10/22/15 1557  WBC 5.7  NEUTROABS 3.1  HGB 11.3*  HCT 36.5  MCV 90.6  PLT 179   Cardiac Enzymes: No results for input(s): CKTOTAL, CKMB, CKMBINDEX, TROPONINI in the last 168 hours. BNP: Invalid input(s): POCBNP CBG: No results for input(s): GLUCAP in the last 168 hours.  Radiological Exams on Admission: No results found.  EKG: Normal sinus rhythm at 72, mild ST changes on anterior leads.  Assessment/Plan Principal Problem:   Non-STEMI (non-ST elevated myocardial infarction) (HCC) Admit to telemetry. Start on IV heparin. Cycle serial troponin. Patient currently chest pain-free. Continue aspirin, Plavix, sublingual nitrate when necessary, continue metoprolol and Imdur. Continue statin.Will add Nitropaste. -Discussed with cardiology who agree with plan. Will follow up with recommendations. -She had multiple hospitalization for once stable angina and NST EMI. As per her last cardiac cath that showed occluded graft with patent  LIMA to LAD and patient was considered not a candidate for redo CABG or PCI. Recommended for optimal medical management.  Active Problems: Complex partial seizure Symptoms appear to to be mimicking her underlying seizures. Reports similar episode back in November when she was admitted. (Was diagnosed with compressed partial seizure for the first time during that time)  Recently increased dose of Keppra 4 weeks back after she had prolonged EEG done by Dr. Sherryll Burger.Maryclare Labrador continue home dose Keppra for now. Neurology consulted and will follow up with recommendations.      Diabetes mellitus Hold metformin and glipizide. Resume home dose Lantus and monitor on sliding scale coverage.  History of TIA /stroke and peripheral arterial disease History of bilateral carotid endarterectomy . Continue aspirin, Plavix and statin.  Diet:cardiac/diabetic  DVT  prophylaxis: IV heparin   Code Status:  DNR Family Communication: son at bedside Disposition Plan: admit to telemetry   [facility], [facility] Pager [phone]  Total time spent on admission :70 minutes  If 7PM-7AM, please contact night-coverage [email or web address] Password [facility] 10/22/2015, 5:41 PM

## 2015-10-22 NOTE — Consult Note (Signed)
Admit date: 10/22/2015 Referring Physician  Dr. Gonzella Lex Primary Physician SPARKS,JEFFREY D, MD Primary Cardiologist Dr. Gwen Pounds Reason for Consultation  Chest pain  HPI: 75 year old with history of hypertension, diabetes, TIA, peripheral arterial disease with bilateral carotid endarterectomy who previously had bypass surgery in 1997 however all vein grafts are occluded and LIMA to LAD is patent and residual disease is not amenable to intervention or redo bypass and has had multiple admissions for unstable angina and non-ST elevation myocardial infarction I asked of which was 07/06/15 where troponin was mildly elevated 0.89 with ST depression inferior leads but no ST elevation, medical management, who is here again today with complaint of chest pain and possible transient ischemic attack.  She had a strange sensation on the left side of her head, facial droop, felt as though something was coming on that resolved after about 20 minutes. She has frequent chest pain and uses a nitroglycerin patch daily. She is on isosorbide, beta blocker, nitroglycerin patch and when necessary nitroglycerin.  During this episode of facial drooping, her son was able to ask her if she could squeeze his hand and she was able to do this. After the symptoms resolved she became very scared, emotional and then developed chest discomfort. EMS was called. Nitroglycerin was given and she was symptom-free.  Here in the emergency room, her initial troponin is 0.13 and she is symptom free currently.  Her EKG at 1506 on 10/22/15 demonstrates sinus rhythm with T-wave inversion/ST depression in the inferior lateral leads as well as subtle elevation in the early anterior precordial leads which is essentially unchanged from prior EKG on 07/18/15 personally viewed.    PMH:   Past Medical History  Diagnosis Date  . Coronary artery disease   . Stroke (HCC)   . Anginal pain (HCC)   . Psoriasis   . Diabetes mellitus without  complication (HCC)   . Acid reflux     PSH:   Past Surgical History  Procedure Laterality Date  . Coronary artery bypass graft     Allergies:  Vicodin; Talwin; and Valium Prior to Admit Meds:   Prior to Admission medications   Medication Sig Start Date End Date Taking? Authorizing Provider  aspirin EC 81 MG tablet Take 81 mg by mouth at bedtime.   Yes Historical Provider, MD  atorvastatin (LIPITOR) 40 MG tablet Take 1 tablet (40 mg total) by mouth at bedtime. Patient taking differently: Take 20 mg by mouth at bedtime.  07/10/15  Yes Richarda Overlie, MD  bimatoprost (LUMIGAN) 0.01 % SOLN Place 1 drop into both eyes at bedtime.   Yes Historical Provider, MD  brimonidine (ALPHAGAN) 0.2 % ophthalmic solution Place 1 drop into both eyes 2 (two) times daily.    Yes Historical Provider, MD  CALCIUM PO Take 1 tablet by mouth daily.   Yes Historical Provider, MD  clopidogrel (PLAVIX) 75 MG tablet Take 75 mg by mouth daily.   Yes Historical Provider, MD  glipiZIDE (GLUCOTROL XL) 10 MG 24 hr tablet Take 10 mg by mouth every morning. 04/26/15  Yes Historical Provider, MD  Insulin Glargine (LANTUS SOLOSTAR) 100 UNIT/ML Solostar Pen Inject 24 Units into the skin at bedtime.   Yes Historical Provider, MD  Insulin Lispro Prot & Lispro (HUMALOG MIX 75/25 KWIKPEN) (75-25) 100 UNIT/ML Kwikpen Inject 6-12 Units into the skin 4 (four) times daily - after meals and at bedtime. Per sliding scale: 6 units plus:   CBG 150-200 add 1 unit, 201-250 add 2 units,  251-300 add 3 units, 301-350 add 4 units 351-400 add 5 units, >400 add 6 units and call MD   Yes Historical Provider, MD  isosorbide mononitrate (IMDUR) 60 MG 24 hr tablet Take 60 mg by mouth daily.   Yes Historical Provider, MD  levETIRAcetam (KEPPRA XR) 500 MG 24 hr tablet Take 1 tablet (500 mg total) by mouth daily. Patient taking differently: Take 500 mg by mouth 2 (two) times daily.  07/10/15  Yes Richarda Overlie, MD  MAGNESIUM PO Take 1 tablet by mouth daily.    Yes Historical Provider, MD  metFORMIN (GLUCOPHAGE) 1000 MG tablet Take 1,000 mg by mouth 2 (two) times daily. 04/25/15  Yes Historical Provider, MD  metoprolol tartrate (LOPRESSOR) 25 MG tablet Take 25 mg by mouth at bedtime.   Yes Historical Provider, MD  Multiple Vitamins-Minerals (ZINC PO) Take 1 tablet by mouth daily.   Yes Historical Provider, MD  pantoprazole (PROTONIX) 40 MG tablet Take 1 tablet (40 mg total) by mouth daily. 07/10/15  Yes Richarda Overlie, MD  timolol (BETIMOL) 0.5 % ophthalmic solution Place 1 drop into both eyes 2 (two) times daily.   Yes Historical Provider, MD  triamcinolone ointment (KENALOG) 0.1 % Apply 1 application topically 3 (three) times daily as needed (itching from psoriasis).   Yes Historical Provider, MD  vitamin B-12 (CYANOCOBALAMIN) 1000 MCG tablet Take 1,000 mcg by mouth daily.   Yes Historical Provider, MD  vitamin E 400 UNIT capsule Take 400 Units by mouth daily.   Yes Historical Provider, MD  acetaminophen (TYLENOL ARTHRITIS PAIN) 650 MG CR tablet Take 650-1,300 mg by mouth every 8 (eight) hours as needed for pain.    Historical Provider, MD  isosorbide mononitrate (IMDUR) 30 MG 24 hr tablet Take 0.5 tablets (15 mg total) by mouth daily. Patient not taking: Reported on 10/22/2015 07/10/15   Richarda Overlie, MD  nitroGLYCERIN (NITROSTAT) 0.4 MG SL tablet Place 0.4 mg under the tongue every 5 (five) minutes as needed for chest pain.    Historical Provider, MD  nystatin cream (MYCOSTATIN) Apply 1 application topically 2 (two) times daily as needed (yeast infection).  04/25/15   Historical Provider, MD   Fam HX:    Family History  Problem Relation Age of Onset  . Heart attack Father   . Diabetes Mother   . Cancer Mother   . Stroke Mother    Social HX:    Social History   Social History  . Marital Status: Widowed    Spouse Name: N/A  . Number of Children: N/A  . Years of Education: N/A   Occupational History  . Not on file.   Social History Main Topics    . Smoking status: Never Smoker   . Smokeless tobacco: Not on file  . Alcohol Use: No  . Drug Use: No  . Sexual Activity: Not on file   Other Topics Concern  . Not on file   Social History Narrative     ROS:  All 11 ROS were addressed and are negative except what is stated in the HPI   Physical Exam: Blood pressure 125/63, pulse 68, temperature 98.2 F (36.8 C), temperature source Oral, resp. rate 20, SpO2 96 %.   General: Well developed, well nourished, in no acute distress Head: Eyes PERRLA, No xanthomas.  Rosy cheeks Normal cephalic and atramatic  Lungs:   Clear bilaterally to auscultation and percussion. Normal respiratory effort. No wheezes, no rales. Heart:   HRRR S1 S2 Pulses are 2+ &  equal. No murmur, rubs, gallops.  No carotid bruit. No JVD.  No abdominal bruits.  Abdomen: Bowel sounds are positive, abdomen soft and non-tender without masses. No hepatosplenomegaly. Msk:  Back normal. Normal strength and tone for age. Extremities:  No clubbing, cyanosis or edema.  DP +1 Neuro: Alert and oriented X 3, non-focal, MAE x 4 GU: Deferred Rectal: Deferred Psych:  Good affect, responds appropriately, quite happy      Labs: Lab Results  Component Value Date   WBC 5.7 10/22/2015   HGB 11.3* 10/22/2015   HCT 36.5 10/22/2015   MCV 90.6 10/22/2015   PLT 179 10/22/2015     Recent Labs Lab 10/22/15 1557  NA 140  K 4.9  CL 105  CO2 22  BUN 12  CREATININE 0.82  CALCIUM 9.5  GLUCOSE 224*   No results for input(s): CKTOTAL, CKMB, TROPONINI in the last 72 hours. Lab Results  Component Value Date   CHOL 158 07/07/2015   HDL 43 07/07/2015   LDLCALC 82 07/07/2015   TRIG 167* 07/07/2015   No results found for: [facility]   Radiology:  No results found. Personally viewed.  EKG:   Her EKG at 1506 on 10/22/15 demonstrates sinus rhythm with T-wave inversion/ST depression in the inferior lateral leads as well as subtle elevation in the early anterior precordial leads which  is essentially unchanged from prior EKG on 07/18/15 personally viewed. Personally viewed.   ECHO: 07/07/15 - Left ventricle: The cavity size was normal. Wall thickness wasnormal. Systolic function was normal. The estimated ejectionfraction was in the range of 55% to 60%. - Mitral valve: Calcified annulus. Mildly thickened leaflets . There was mild regurgitation. - Left atrium: The atrium was mildly dilated. - Tricuspid valve: There was moderate regurgitation. - Pulmonary arteries: PA peak pressure: 44 mm Hg (S).  ASSESSMENT/PLAN:    75 year old female with known severe coronary artery disease status post bypass surgery with occluded vein grafts, patent LIMA to LAD with multiple admissions with non-ST elevation myocardial infarction here today with minimally elevated troponin point-of-care of 0.13.  Non-ST elevation myocardial infarction - Recent chest pain episode surrounding emotional stress and possible neurologic event - Fairly similar to her November 2016 admission were medical management took place. - Continue to cycle cardiac markers. - I do not think that an echocardiogram is necessary since one was just done in November under similar situation. - Previously at last cardiac catheterization, she was told that there is no further targets for revascularization, percutaneous intervention or redo bypass. - It would not be unreasonable to start IV heparin and continued for next 24-48 hours. - Continue aspirin, Plavix, metoprolol, isosorbide, nitroglycerin patch, statin - Her EKG is relatively unchanged from prior EKG demonstrating abnormal ST segment depression inferior laterally. - Currently chest pain-free.  Coronary artery disease - Significant as above.  TIA like symptoms - Per primary team.  Diabetes - Glucose 224 in the emergency room - Per primary team  Psoriasis/rosacea - Noted rosy cheeks on exam  We will follow along. Discussed with Dr. Keturah Shavers, Mikel Hardgrove,  MD  10/22/2015  5:18 PM

## 2015-10-22 NOTE — Consult Note (Signed)
ANTICOAGULATION CONSULT NOTE - Initial Consult  Pharmacy Consult for heparin Indication: chest pain/ACS  Patient Measurements: Height:  (162.6 cm) Weight: 150 lb (68.04 kg) IBW/kg (Calculated) : 54.7 Heparin Dosing Weight: 68 kg  Vital Signs: Temp: 98.2 F (36.8 C) (02/28 1512) Temp Source: Oral (02/28 1512) BP: 120/62 mmHg (02/28 1715) Pulse Rate: 70 (02/28 1715)  Labs:  Recent Labs  10/22/15 1557  HGB 11.3*  HCT 36.5  PLT 179  CREATININE 0.82    Estimated Creatinine Clearance: 56.1 mL/min (by C-G formula based on Cr of 0.82).  Assessment: 75 yo female who presented with chest pain and possible TIA. Pt has hx of TIA and multiple admissions for unstable angina and NSTEMI since November 2016. Pt is on Plavix PTA but no anticoagulation. Troponin elevated to 0.13. CBC stable.  Goal of Therapy:  Heparin level 0.3-0.7 units/ml Monitor platelets by anticoagulation protocol: Yes   Plan:  Give 4000 units bolus x 1 Start heparin infusion at 850 units/hr Check anti-Xa level in 8 hours and daily while on heparin Continue to monitor H&H and platelets  Greggory Stallion, PharmD Clinical Pharmacy Resident Pager # [phone] 10/22/2015 6:06 PM

## 2015-10-22 NOTE — ED Provider Notes (Signed)
CSN: [id number]     Arrival date & time 10/22/15  1500 History   First MD Initiated Contact with Patient 10/22/15 1548     Chief Complaint  Patient presents with  . Chest Pain  . Transient Ischemic Attack      HPI  Patient presents for evaluation with her son after an episode in the car that was witnessed by her son. She felt something "coming on". She had weakness in her arm strange sensation left side of her head. Her left face drooping. This lasted about 20 minutes and resolved. According to the son probably episode he was able to ask her if she could squeeze his hand and she did. Did not have full loss of consciousness. Did not have incontinence. Upon resolving she states she became emotional and an "scared". Start having some chest pain. EMS was summoned. Was given a nitroglycerin and became symptom free.  Patient has a history of over 10 years of episodes that the son, and patient mostly were described as "TIAs". Most recently in November of last year the patient underwent testing with neurology and was diagnosed with a partial complex seizure disorder. Was placed on Keppra. Has been titrated up to 750 twice a day and has much improvement in the frequency of the episodes. Both son, and patient state that this was an exact reminiscent episode that she had in November and she is symptom-free now.  Patient also has a history of coronary artery disease status post coronary artery bypass grafting. She has frequent chest pain. She wears a nitroglycerin glycerin patch during the day. She uses nitroglycerin almost every day. She has been told by her cardiologist that her anatomy and bypasses are such that no additional percutaneous interventions would be able to be attempted, and she is not a candidate for redo bypass surgery and medical management is all that she would be able to undergo. She is on Imdur, beta blocker, nitroglycerin patch, and when necessary nitroglycerin.     Past Medical  History  Diagnosis Date  . Coronary artery disease   . Acid reflux   . Hypertension   . Hypercholesterolemia   . Chronic stable angina (HCC)   . Type II diabetes mellitus (HCC)   . 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 TIA (transient ischemic attack)     "several over the last 20 years" (10/22/2015)  . Arthritis     "all over"  . Psoriatic arthritis (HCC)   . Myocardial infarction (HCC) 1991; 07/06/2015  . NSTEMI (non-ST elevated myocardial infarction) (HCC) 10/22/2015   Past Surgical History  Procedure Laterality Date  . Tonsillectomy  ~ 1947    2nd grade  . Appendectomy  1950s    elementary school  . Cholecystectomy open  ~ 2014  . Abdominal hysterectomy  1973  . Dilation and curettage of uterus  X 1  . 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 Cardiac catheterization  X 2-3  . Carotid stent insertion Bilateral 2011-2012    right-left  . Cataract extraction w/ intraocular lens  implant, bilateral Bilateral 2009-2010  . Coronary angioplasty    . Thrombectomy femoral artery Right ~ 2015    right femoral artery occlusion/notes 12/13/2014   Family History  Problem Relation Age of Onset  . Heart attack Father   . Diabetes Mother   . Cancer Mother   . Stroke Mother    Social History  Substance Use Topics  . Smoking status: Never Smoker   . Smokeless tobacco: Never Used  . Alcohol Use: No   OB History    No data available     Review of Systems  Constitutional: Negative for fever, chills, diaphoresis, appetite change and fatigue.  HENT: Negative for mouth sores, sore throat and trouble swallowing.   Eyes: Negative for visual disturbance.  Respiratory: Positive for chest tightness. Negative for cough, shortness of breath and wheezing.   Cardiovascular: Positive for chest pain.  Gastrointestinal: Negative for nausea, vomiting, abdominal pain, diarrhea and abdominal distention.  Endocrine: Negative for  polydipsia, polyphagia and polyuria.  Genitourinary: Negative for dysuria, frequency and hematuria.  Musculoskeletal: Negative for gait problem.  Skin: Negative for color change, pallor and rash.  Neurological: Positive for seizures. Negative for dizziness, syncope, light-headedness and headaches.  Hematological: Does not bruise/bleed easily.  Psychiatric/Behavioral: Negative for behavioral problems and confusion.      Allergies  Vicodin; Talwin; and Valium  Home Medications   Prior to Admission medications   Medication Sig Start Date End Date Taking? Authorizing Provider  aspirin EC 81 MG tablet Take 81 mg by mouth at bedtime.   Yes Historical Provider, MD  atorvastatin (LIPITOR) 40 MG tablet Take 1 tablet (40 mg total) by mouth at bedtime. Patient taking differently: Take 20 mg by mouth at bedtime.  07/10/15  Yes Richarda Overlie, MD  bimatoprost (LUMIGAN) 0.01 % SOLN Place 1 drop into both eyes at bedtime.   Yes Historical Provider, MD  brimonidine (ALPHAGAN) 0.2 % ophthalmic solution Place 1 drop into both eyes 2 (two) times daily.    Yes Historical Provider, MD  CALCIUM PO Take 1 tablet by mouth daily.   Yes Historical Provider, MD  clopidogrel (PLAVIX) 75 MG tablet Take 75 mg by mouth daily.   Yes Historical Provider, MD  glipiZIDE (GLUCOTROL XL) 10 MG 24 hr tablet Take 10 mg by mouth every morning. 04/26/15  Yes Historical Provider, MD  Insulin Glargine (LANTUS SOLOSTAR) 100 UNIT/ML Solostar Pen Inject 24 Units into the skin at bedtime.   Yes Historical Provider, MD  Insulin Lispro Prot & Lispro (HUMALOG MIX 75/25 KWIKPEN) (75-25) 100 UNIT/ML Kwikpen Inject 6-12 Units into the skin 4 (four) times daily - after meals and at bedtime. Per sliding scale: 6 units plus:   CBG 150-200 add 1 unit, 201-250 add 2 units, 251-300 add 3 units, 301-350 add 4 units 351-400 add 5 units, >400 add 6 units and call MD   Yes Historical Provider, MD  MAGNESIUM PO Take 1 tablet by mouth daily.   Yes  Historical Provider, MD  metFORMIN (GLUCOPHAGE) 1000 MG tablet Take 1,000 mg by mouth 2 (two) times daily. 04/25/15  Yes Historical Provider, MD  metoprolol tartrate (LOPRESSOR) 25 MG tablet Take 25 mg by mouth at bedtime.   Yes Historical Provider, MD  Multiple Vitamins-Minerals (ZINC PO) Take 1 tablet by mouth daily.   Yes Historical Provider, MD  pantoprazole (PROTONIX) 40 MG tablet Take 1 tablet (40 mg total) by mouth daily. 07/10/15  Yes Richarda Overlie, MD  timolol (BETIMOL) 0.5 % ophthalmic solution Place 1 drop into both eyes 2 (two) times daily.   Yes Historical Provider, MD  triamcinolone ointment (KENALOG) 0.1 % Apply 1 application topically 3 (three) times daily as needed (itching from psoriasis).   Yes Historical Provider, MD  vitamin B-12 (CYANOCOBALAMIN) 1000 MCG tablet Take 1,000 mcg by mouth daily.   Yes Historical Provider, MD  vitamin E  400 UNIT capsule Take 400 Units by mouth daily.   Yes Historical Provider, MD  isosorbide mononitrate (IMDUR) 60 MG 24 hr tablet Take 1 tablet (60 mg total) by mouth daily. 10/24/15   Marinda Elk, MD  levETIRAcetam (KEPPRA) 1000 MG tablet Take 1 tablet (1,000 mg total) by mouth 2 (two) times daily. 10/24/15   Marinda Elk, MD  nitroGLYCERIN (NITROSTAT) 0.4 MG SL tablet Place 0.4 mg under the tongue every 5 (five) minutes as needed for chest pain.    Historical Provider, MD  nystatin cream (MYCOSTATIN) Apply 1 application topically 2 (two) times daily as needed (yeast infection).  04/25/15   Historical Provider, MD   BP 146/58 mmHg  Pulse 57  Temp(Src) 97.7 F (36.5 C) (Oral)  Resp 15  Ht 5\' 4"  (1.626 m)  Wt 146 lb 9.6 oz (66.497 kg)  BMI 25.15 kg/m2  SpO2 98% Physical Exam  Constitutional: She is oriented to person, place, and time. She appears well-developed and well-nourished. No distress.  HENT:  Head: Normocephalic.  Eyes: Conjunctivae are normal. Pupils are equal, round, and reactive to light. No scleral icterus.  Neck: Normal  range of motion. Neck supple. No thyromegaly present.  Cardiovascular: Normal rate and regular rhythm.  Exam reveals no gallop and no friction rub.   No murmur heard. Pulmonary/Chest: Effort normal and breath sounds normal. No respiratory distress. She has no wheezes. She has no rales.  Abdominal: Soft. Bowel sounds are normal. She exhibits no distension. There is no tenderness. There is no rebound.  Musculoskeletal: Normal range of motion.  Neurological: She is alert and oriented to person, place, and time.  Skin: Skin is warm and dry. No rash noted.  Psychiatric: She has a normal mood and affect. Her behavior is normal.    ED Course  Procedures (including critical care time) Labs Review Labs Reviewed  CBC WITH DIFFERENTIAL/PLATELET - Abnormal; Notable for the following:    Hemoglobin 11.3 (*)    All other components within normal limits  BASIC METABOLIC PANEL - Abnormal; Notable for the following:    Glucose, Bld 224 (*)    All other components within normal limits  BASIC METABOLIC PANEL - Abnormal; Notable for the following:    Glucose, Bld 184 (*)    All other components within normal limits  CBC - Abnormal; Notable for the following:    RBC 3.82 (*)    Hemoglobin 11.2 (*)    HCT 34.8 (*)    All other components within normal limits  TROPONIN I - Abnormal; Notable for the following:    Troponin I 0.23 (*)    All other components within normal limits  TROPONIN I - Abnormal; Notable for the following:    Troponin I 0.18 (*)    All other components within normal limits  TROPONIN I - Abnormal; Notable for the following:    Troponin I 0.19 (*)    All other components within normal limits  HEPARIN LEVEL (UNFRACTIONATED) - Abnormal; Notable for the following:    Heparin Unfractionated 0.18 (*)    All other components within normal limits  GLUCOSE, CAPILLARY - Abnormal; Notable for the following:    Glucose-Capillary 161 (*)    All other components within normal limits   GLUCOSE, CAPILLARY - Abnormal; Notable for the following:    Glucose-Capillary 162 (*)    All other components within normal limits  GLUCOSE, CAPILLARY - Abnormal; Notable for the following:    Glucose-Capillary 213 (*)  All other components within normal limits  GLUCOSE, CAPILLARY - Abnormal; Notable for the following:    Glucose-Capillary 127 (*)    All other components within normal limits  CBC - Abnormal; Notable for the following:    RBC 3.64 (*)    Hemoglobin 10.1 (*)    HCT 32.8 (*)    All other components within normal limits  GLUCOSE, CAPILLARY - Abnormal; Notable for the following:    Glucose-Capillary 220 (*)    All other components within normal limits  GLUCOSE, CAPILLARY - Abnormal; Notable for the following:    Glucose-Capillary 239 (*)    All other components within normal limits  I-STAT TROPOININ, ED - Abnormal; Notable for the following:    Troponin i, poc 0.13 (*)    All other components within normal limits  HEPARIN LEVEL (UNFRACTIONATED)  HEPARIN LEVEL (UNFRACTIONATED)  HEPARIN LEVEL (UNFRACTIONATED)    Imaging Review No results found. I have personally reviewed and evaluated these images and lab results as part of my medical decision-making.   EKG Interpretation   Date/Time:  Tuesday October 22 2015 15:06:29 EST Ventricular Rate:  72 PR Interval:  138 QRS Duration: 118 QT Interval:  395 QTC Calculation: 432 R Axis:   100 Text Interpretation:  Sinus rhythm Nonspecific intraventricular conduction  delay Repol abnrm suggests ischemia, anterolateral Minimal ST elevation,  anterior leads Confirmed by Fayrene Fearing  MD, Cattleya Dobratz ([id number]) on 10/22/2015 3:17:34  PM      MDM   Final diagnoses:  Non-STEMI (non-ST elevated myocardial infarction) (HCC)  Petit mal (HCC)   Remained symptom-free. Her initial troponin is 0.13. Again remains asymptomatic. Has been given Keppra, and aspirin.    Rolland Porter, MD 10/25/15 [phone]

## 2015-10-22 NOTE — Consult Note (Signed)
Reason for Consult: Seizure management  Referring Physician: Eddie North, M.D.    HPI: Leah Small is a 75 y.o.  Right handed woman referred for seizure management. The patient is accompanied by her son who provides some of the history as well. According to them, she suffered a significant stroke about 10 years ago. This stroke afffected her left side of her body. In the patient's words " I had to learn to walk and talk again." Eventually she made a good but not complete recovery. At baseline she has left sided weakness and numbness. Oddly, an MRI brain performed in 2016 demonstrates no evidence of a stroke despite the significant deficits reported by the patient.   For the past 2 years she has been suffering intermittent spells of left face "drawing up" and left arm and leg weakness accompanied by staring and speech arrest. There is no convulsive activity, tongue biting or urinary incontinence. She has undergone extensive EEGs including studies that have recorded spells without evidence of seizure. She was empirically placed on Keppra and the spells have lessened. Per son, stress or too much physical exertion brings the spells on.   Two of the spells have been different including the one she suffered earlier today and another in Nov 2016. During those two spells, she also developed chest pain and the spells lasted about  20 minutes. Patient was in the car with her son this afternoon when she felt like she was "giving away.". She then felt weakness in her left arm and shoulder with some pain that radiated to the back of her left neck.  This was followed by substernal chest pain 10/10 in severity lasting for a few minutes. Her troponin was positive. EKG demonstrated subtle ST elevation in anterior leads.   Past Medical History  Diagnosis Date  . Coronary artery disease   . Acid reflux   . Hypertension   . Hypercholesterolemia   . Chronic stable angina (HCC)   . Type II diabetes mellitus  (HCC)   . 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 TIA (transient ischemic attack)     "several over the last 20 years" (10/22/2015)  . Arthritis     "all over"  . Psoriatic arthritis (HCC)   . Myocardial infarction (HCC) 1991; 07/06/2015  . NSTEMI (non-ST elevated myocardial infarction) (HCC) 10/22/2015     Past Surgical History  Procedure Laterality Date  . Tonsillectomy  ~ 1947    2nd grade  . Appendectomy  1950s    elementary school  . Cholecystectomy open  ~ 2014  . Abdominal hysterectomy  1973  . Dilation and curettage of uterus  X 1  . 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 Cardiac catheterization  X 2-3  . Carotid stent insertion Bilateral 2011-2012    right-left  . Cataract extraction w/ intraocular lens  implant, bilateral Bilateral 2009-2010  . Coronary angioplasty    . Thrombectomy femoral artery Right ~ 2015    right femoral artery occlusion/notes 12/13/2014     Family History  Problem Relation Age of Onset  . Heart attack Father   . Diabetes Mother   . Cancer Mother   . Stroke Mother     Social History   Social History  . Marital Status: Widowed    Spouse Name: N/A  . Number of Children: N/A  . Years of Education: N/A   Occupational History  .  Not on file.   Social History Main Topics  . Smoking status: Never Smoker   . Smokeless tobacco: Never Used  . Alcohol Use: No  . Drug Use: No  . Sexual Activity: No   Other Topics Concern  . Not on file   Social History Narrative    Allergies  Allergen Reactions  . Vicodin [Hydrocodone-Acetaminophen] Nausea And Vomiting  . Talwin [Pentazocine]   . Valium [Diazepam] Other (See Comments)    Makes her feel like she's flying    No current facility-administered medications on file prior to encounter.   Current Outpatient Prescriptions on File Prior to Encounter  Medication Sig Dispense Refill  . aspirin EC 81 MG tablet Take 81 mg by  mouth at bedtime.    [name] atorvastatin (LIPITOR) 40 MG tablet Take 1 tablet (40 mg total) by mouth at bedtime. (Patient taking differently: Take 20 mg by mouth at bedtime. ) 30 tablet 1  . bimatoprost (LUMIGAN) 0.01 % SOLN Place 1 drop into both eyes at bedtime.    . brimonidine (ALPHAGAN) 0.2 % ophthalmic solution Place 1 drop into both eyes 2 (two) times daily.     [name] CALCIUM PO Take 1 tablet by mouth daily.    . clopidogrel (PLAVIX) 75 MG tablet Take 75 mg by mouth daily.    [name] glipiZIDE (GLUCOTROL XL) 10 MG 24 hr tablet Take 10 mg by mouth every morning.    . Insulin Glargine (LANTUS SOLOSTAR) 100 UNIT/ML Solostar Pen Inject 24 Units into the skin at bedtime.    . Insulin Lispro Prot & Lispro (HUMALOG MIX 75/25 KWIKPEN) (75-25) 100 UNIT/ML Kwikpen Inject 6-12 Units into the skin 4 (four) times daily - after meals and at bedtime. Per sliding scale: 6 units plus:   CBG 150-200 add 1 unit, 201-250 add 2 units, 251-300 add 3 units, 301-350 add 4 units 351-400 add 5 units, >400 add 6 units and call MD    . levETIRAcetam (KEPPRA XR) 500 MG 24 hr tablet Take 1 tablet (500 mg total) by mouth daily. (Patient taking differently: Take 500 mg by mouth 2 (two) times daily. ) 30 tablet 0  . MAGNESIUM PO Take 1 tablet by mouth daily.    . metFORMIN (GLUCOPHAGE) 1000 MG tablet Take 1,000 mg by mouth 2 (two) times daily.    . Multiple Vitamins-Minerals (ZINC PO) Take 1 tablet by mouth daily.    . pantoprazole (PROTONIX) 40 MG tablet Take 1 tablet (40 mg total) by mouth daily. 30 tablet 1  . timolol (BETIMOL) 0.5 % ophthalmic solution Place 1 drop into both eyes 2 (two) times daily.    [name] triamcinolone ointment (KENALOG) 0.1 % Apply 1 application topically 3 (three) times daily as needed (itching from psoriasis).    . vitamin B-12 (CYANOCOBALAMIN) 1000 MCG tablet Take 1,000 mcg by mouth daily.    . vitamin E 400 UNIT capsule Take 400 Units by mouth daily.    [name] acetaminophen (TYLENOL ARTHRITIS PAIN) 650 MG CR tablet  Take 650-1,300 mg by mouth every 8 (eight) hours as needed for pain.    . isosorbide mononitrate (IMDUR) 30 MG 24 hr tablet Take 0.5 tablets (15 mg total) by mouth daily. (Patient not taking: Reported on 10/22/2015) 30 tablet 0  . nitroGLYCERIN (NITROSTAT) 0.4 MG SL tablet Place 0.4 mg under the tongue every 5 (five) minutes as needed for chest pain.    [name] nystatin cream (MYCOSTATIN) Apply 1 application topically 2 (two) times daily as needed (  yeast infection).          Physical Exam:  General: BP 157/66 mmHg  Pulse 74  Temp(Src) 97.8 F (36.6 C) (Oral)  Resp 24  Ht  (1.626 m)  Wt 68.04 kg (150 lb)  BMI 25.73 kg/m2  SpO2 96%  CV: RRR no mumurs. Carotids no bruit bilaterally HEENT:  Fundoscopic exam reveals no papilledema. Normocephalic. Atraumatic.  Mental Status: Alert. Oriented. Fluent. Appropriate. Names. Repeats. Follows commands.  Cranial Nerves: PEARRL, EOMI, VFF, Facial sensation decreased on left side to temperature. No facial droop. Hearing intact bilaterally. Voice is not hoarse. Tongue protrudes midline. Motor: No drift of either arm or leg extended. Normal muscle bulk and tone. Weakness of left ankle dorsiflexion and left triceps and grip strength.  Sensory:Decreased to temperature and vibration on left side  Coord: Finger to nose intact. Heel to shin intact.     Assessment: 1. Spells of altered consciousness. Differential diagnosis include: Temporal lobe epilepsy, Non-epileptic psychogenic pseudoseizures, cardiac arrhythmia. This has already been extensively worked up before. In light of her admission for NSTEMI, the question of whether she actually has seizures can be deferred to an elective outpatient workup if deemed necessary by her treating neurologist.  Factors that suggest non-epileptic spells include history of a severe stroke with normal MRI brain. Furthermore, this stroke apparently caused her to lose speech and left sided hemiparesis which does not localize  to one region of the brain in a right handed person.  Third, her spells are triggered by stress per son. Fourth, her EEGs have been normal.    Recommendations: 1. Increase Keppra to  bid 2. Follow up with outpatient neurologist.  3. No driving.   Thank you for allowing [place] to participate in the care of this patient. Please call us with questions.

## 2015-10-22 NOTE — ED Notes (Signed)
Pt states she has opartialo sz? That mimic stroke like s/s with rt sided weakness and increase bp that last for a min or 2 and then resolve which she thinks happed today pt also states she has been having cp ,or ? Reflux she states, she was given  i nitro  And states nitro helped and wears a patch nitro has 20 left forearm

## 2015-10-22 NOTE — Progress Notes (Signed)
PHARMACIST - PHYSICIAN ORDER COMMUNICATION  CONCERNING: P&T Medication Policy on Herbal Medications  DESCRIPTION:  This patient's order for:  Zinc 15  has been noted.  This product(s) is classified as an "herbal" or natural product. Due to a lack of definitive safety studies or FDA approval, nonstandard manufacturing practices, plus the potential risk of unknown drug-drug interactions while on inpatient medications, the Pharmacy and Therapeutics Committee does not permit the use of "herbal" or natural products of this type within [facility].   ACTION TAKEN: The pharmacy department is unable to verify this order at this time and your patient has been informed of this safety policy. Please reevaluate patient's clinical condition at discharge and address if the herbal or natural product(s) should be resumed at that time.   We stock Zinc 220, which is 4x the amount of elemental Zinc in the pt's home medication.  Please order Zinc 220 if supplementation is needed.     Marisue Humble, PharmD Clinical Pharmacist [facility]

## 2015-10-23 ENCOUNTER — Inpatient Hospital Stay (HOSPITAL_COMMUNITY): Payer: Medicare Other

## 2015-10-23 DIAGNOSIS — I214 Non-ST elevation (NSTEMI) myocardial infarction: Secondary | ICD-10-CM | POA: Diagnosis not present

## 2015-10-23 DIAGNOSIS — R079 Chest pain, unspecified: Secondary | ICD-10-CM

## 2015-10-23 DIAGNOSIS — G40219 Localization-related (focal) (partial) symptomatic epilepsy and epileptic syndromes with complex partial seizures, intractable, without status epilepticus: Secondary | ICD-10-CM

## 2015-10-23 LAB — CBC
HEMATOCRIT: 34.8 % — AB (ref 36.0–46.0)
HEMOGLOBIN: 11.2 g/dL — AB (ref 12.0–15.0)
MCH: 29.3 pg (ref 26.0–34.0)
MCHC: 32.2 g/dL (ref 30.0–36.0)
MCV: 91.1 fL (ref 78.0–100.0)
Platelets: 157 10*3/uL (ref 150–400)
RBC: 3.82 MIL/uL — AB (ref 3.87–5.11)
RDW: 14.6 % (ref 11.5–15.5)
WBC: 5.1 10*3/uL (ref 4.0–10.5)

## 2015-10-23 LAB — GLUCOSE, CAPILLARY
GLUCOSE-CAPILLARY: 220 mg/dL — AB (ref 65–99)
Glucose-Capillary: 162 mg/dL — ABNORMAL HIGH (ref 65–99)
Glucose-Capillary: 213 mg/dL — ABNORMAL HIGH (ref 65–99)
Glucose-Capillary: 127 mg/dL — ABNORMAL HIGH (ref 65–99)

## 2015-10-23 LAB — TROPONIN I
TROPONIN I: 0.18 ng/mL — AB (ref <0.031)
TROPONIN I: 0.19 ng/mL — AB (ref <0.031)

## 2015-10-23 LAB — HEPARIN LEVEL (UNFRACTIONATED)
HEPARIN UNFRACTIONATED: 0.18 [IU]/mL — AB (ref 0.30–0.70)
HEPARIN UNFRACTIONATED: 0.41 [IU]/mL (ref 0.30–0.70)
Heparin Unfractionated: 0.62 IU/mL (ref 0.30–0.70)

## 2015-10-23 LAB — BASIC METABOLIC PANEL
ANION GAP: 9 (ref 5–15)
BUN: 13 mg/dL (ref 6–20)
CO2: 25 mmol/L (ref 22–32)
Calcium: 9.2 mg/dL (ref 8.9–10.3)
Chloride: 105 mmol/L (ref 101–111)
Creatinine, Ser: 0.72 mg/dL (ref 0.44–1.00)
GLUCOSE: 184 mg/dL — AB (ref 65–99)
POTASSIUM: 4.6 mmol/L (ref 3.5–5.1)
SODIUM: 139 mmol/L (ref 135–145)

## 2015-10-23 MED ORDER — ISOSORBIDE MONONITRATE ER 60 MG PO TB24
60.0000 mg | ORAL_TABLET | Freq: Every day | ORAL | Status: DC
  Administered 2015-10-23: 60 mg via ORAL
  Filled 2015-10-23: qty 1

## 2015-10-23 MED ORDER — HEPARIN BOLUS VIA INFUSION
2000.0000 [IU] | Freq: Once | INTRAVENOUS | Status: AC
  Administered 2015-10-23: 2000 [IU] via INTRAVENOUS
  Filled 2015-10-23: qty 2000

## 2015-10-23 NOTE — Progress Notes (Signed)
UR Completed Jacere Pangborn Graves-Bigelow, RN,BSN [phone]  

## 2015-10-23 NOTE — Progress Notes (Signed)
ANTICOAGULATION CONSULT NOTE - Follow Up Consult  Pharmacy Consult for heparin Indication: NSTEMI  Labs:  Recent Labs  10/22/15 1557 10/22/15 2000 10/23/15 0030 10/23/15 0228 10/23/15 0823 10/23/15 1033 10/23/15 1855  HGB 11.3*  --   --   --  11.2*  --   --   HCT 36.5  --   --   --  34.8*  --   --   PLT 179  --   --   --  157  --   --   HEPARINUNFRC  --   --   --  0.18*  --  0.62 0.41  CREATININE 0.82  --   --   --  0.72  --   --   TROPONINI  --  0.23* 0.18*  --  0.19*  --   --      Assessment: 74yo female continuing on heparin with initial dosing for NSTEMI. Previously told no further revascularization options per MD note. HL therapeutic x 2 (0.62, 0.41) on 1050 units/h. Hg low stable, plt wnl, no bleed issues reported.  Goal of Therapy:  Heparin level 0.3-0.7 units/ml   Plan:  Continue Heparin infusion at 1050 units/hr Daily Heparin level/CBC Monitor s/sx bleeding Heparin 24-48h for NSTEMI with no further revascularization options   Thank you for allowing [place] to participate in this patients care. Signe Colt, PharmD Pager: [phone]  10/23/2015 8:08 PM

## 2015-10-23 NOTE — Progress Notes (Signed)
  Echocardiogram 2D Echocardiogram has been performed.  Arvil Chaco 10/23/2015, 3:15 PM

## 2015-10-23 NOTE — Progress Notes (Signed)
ANTICOAGULATION CONSULT NOTE - Follow Up Consult  Pharmacy Consult for heparin Indication: NSTEMI  Labs:  Recent Labs  10/22/15 1557 10/22/15 2000 10/23/15 0030 10/23/15 0228 10/23/15 0823  HGB 11.3*  --   --   --  11.2*  HCT 36.5  --   --   --  34.8*  PLT 179  --   --   --  157  HEPARINUNFRC  --   --   --  0.18*  --   CREATININE 0.82  --   --   --   --   TROPONINI  --  0.23* 0.18*  --   --      Assessment: 75yo female continuing on heparin with initial dosing for NSTEMI. Previously told no further revascularization options per MD note. HL therapeutic x1 (0.62) on 1050 units/h. Hg low stable, plt wnl, no bleed issues reported.  Goal of Therapy:  Heparin level 0.3-0.7 units/ml   Plan:  Heparin infusion at 1050 units/hr 8h HL to confirm Daily HL/CBC Monitor s/sx bleeding Heparin 24-48h for NSTEMI with no further revascularization options  Babs Bertin, PharmD, [facility] Clinical Pharmacist Pager [phone] 10/23/2015 8:47 AM

## 2015-10-23 NOTE — Progress Notes (Signed)
   Cardiologist: Gwen Pounds Subjective:  Feels better, no chest pain, no shortness of breath  Objective:  Vital Signs in the last 24 hours: Temp:  [97.5 F (36.4 C)-98.2 F (36.8 C)] 97.5 F (36.4 C) (03/01 0353) Pulse Rate:  [54-79] 54 (03/01 0353) Resp:  [14-29] 22 (03/01 0353) BP: (94-196)/(47-101) 98/58 mmHg (03/01 0355) SpO2:  [95 %-99 %] 96 % (03/01 0353) Weight:  [146 lb 9.6 oz (66.497 kg)-150 lb (68.04 kg)] 146 lb 9.6 oz (66.497 kg) (03/01 0403)  Intake/Output from previous day: 02/28 0701 - 03/01 0700 In: 400 [P.O.:400] Out: 1500 [Urine:1500]   Physical Exam: General: Well developed, well nourished, in no acute distress. Head:  Normocephalic and atraumatic. Lungs: Clear to auscultation and percussion. Heart: Normal S1 and S2.  No murmur, rubs or gallops.  Abdomen: soft, non-tender, positive bowel sounds. Extremities: No clubbing or cyanosis. No edema. Neurologic: Alert and oriented x 3. Skin: Malar rash    Lab Results:  Recent Labs  10/22/15 1557 10/23/15 0823  WBC 5.7 5.1  HGB 11.3* 11.2*  PLT 179 157    Recent Labs  10/22/15 1557 10/23/15 0823  NA 140 139  K 4.9 4.6  CL 105 105  CO2 22 25  GLUCOSE 224* 184*  BUN 12 13  CREATININE 0.82 0.72    Recent Labs  10/23/15 0030 10/23/15 0823  TROPONINI 0.18* 0.19*   Telemetry: No adverse arrhythmias Personally viewed.   EKG:  Abnormal EKG at baseline with T-wave inversion/ST depression inferior laterally with subtle elevation anterior. No change from prior. Personally viewed.  Cardiac Studies:  Echo pending  Meds: Scheduled Meds: . aspirin EC  81 mg Oral QHS  . atorvastatin  20 mg Oral QHS  . brimonidine  1 drop Both Eyes BID  . clopidogrel  75 mg Oral Daily  . insulin aspart  0-15 Units Subcutaneous TID WC  . insulin aspart  0-5 Units Subcutaneous QHS  . insulin glargine  24 Units Subcutaneous QHS  . isosorbide mononitrate  60 mg Oral QHS  . latanoprost  1 drop Both Eyes QHS  .  levETIRAcetam  1,000 mg Oral BID  . magnesium oxide  400 mg Oral Daily  . metoprolol tartrate  25 mg Oral QHS  . nitroGLYCERIN  0.5 inch Topical 4 times per day  . pantoprazole  40 mg Oral Daily  . sodium chloride flush  3 mL Intravenous Q12H  . timolol  1 drop Both Eyes BID  . vitamin B-12  1,000 mcg Oral Daily  . vitamin E  400 Units Oral Daily   Continuous Infusions: . heparin 1,050 Units/hr (10/23/15 1220)   PRN Meds:.acetaminophen **OR** acetaminophen, nitroGLYCERIN, ondansetron **OR** ondansetron (ZOFRAN) IV  Assessment/Plan:  Principal Problem:   Non-STEMI (non-ST elevated myocardial infarction) (HCC) Active Problems:   Diabetes mellitus with complication (HCC)   Chest pain   Left-sided weakness   Confusional state   NSTEMI (non-ST elevated myocardial infarction) (HCC)   Complex partial seizure disorder (HCC)   Coronary atherosclerosis of native coronary artery   NSTEMI - mild low level troponin - no further chest pain, feeling well. - Continue with IV heparin today and then discontinue tomorrow - We will check an echocardiogram to ensure there is been no significant change in ejection fraction. - Continue with aggressive medical therapy. Meds reviewed as above. - Not a candidate for further percutaneous interventions.  Leah Small 10/23/2015, 12:39 PM

## 2015-10-23 NOTE — Progress Notes (Signed)
[facility] PROGRESS NOTE    Progress Note   Leah Small ZOX:[id number] DOB: April 19, 1941 DOA: 10/22/2015 PCP: Marguarite Arbour, MD   Brief Narrative:   Leah Small is an 75 y.o. female past medical history of CAD with CABG in 97, unstable angina and an NSTEMI, with type 2 diabetes mellitus on insulin, history of TIA, in 2016 she came in with TIA-like symptoms diagnosed with complex arsenal seizures and started on Keppra since then she's been followed up by her neurologist, comes into the ED as she fell but she was passing out with left arm weakness, she didn't lift her left arm and shoulder pain they radiated to the back. This happened she started having chest pain, in the ED her troponins were mildly elevated, with ST segment depression in the inferior leads, cardiology was consulted and recommend cemented continue conservative management as her previous cardiac cath showed that there was no further target for revascularization  percutaneous intervention or redo  bypass.  Assessment/Plan:   Non-STEMI (non-ST elevated myocardial infarction) [facility]): Cardiology was consulted they recommended to treat with IV heparin for 48 hours, continue aspirin, Plavix metoprolol, nitrates and statins.  Diabetes mellitus with complication (HCC) Blood glucose still high continue long-acting insulin and  adjustments sliding scale.  Complex Partial seizures/ Left-sided weakness Continue Keppra awaiting neurology recommendations.  History of TIA: Continue aspirin Plavix and statins.    DVT Prophylaxis - Lovenox ordered.  Family Communication: son Disposition Plan: Home in 48Hrs Code Status:     Code Status Orders        Start     Ordered   10/22/15 1824  Do not attempt resuscitation (DNR)   Continuous    Question Answer Comment  In the event of cardiac or respiratory ARREST Do not call a "code blue"   In the event of cardiac or respiratory ARREST Do not perform Intubation, CPR,  defibrillation or ACLS   In the event of cardiac or respiratory ARREST Use medication by any route, position, wound care, and other measures to relive pain and suffering. May use oxygen, suction and manual treatment of airway obstruction as needed for comfort.      10/22/15 1823    Code Status History    Date Active Date Inactive Code Status Order ID Comments User Context   07/06/2015  6:04 PM 07/10/2015  4:56 PM Full Code [id number]  Ozella Rocks, MD ED        IV Access:    Peripheral IV   Procedures and diagnostic studies:   No results found.   Medical Consultants:    None.  Anti-Infectives:   Anti-infectives    None      Subjective:    Leah Small chest pain or shortness of breath the left arm numbness is improved.  Objective:    Filed Vitals:   10/22/15 2109 10/23/15 0353 10/23/15 0355 10/23/15 0403  BP: 157/66 94/47 98/58    Pulse: 74 54    Temp: 97.8 F (36.6 C) 97.5 F (36.4 C)    TempSrc: Oral Oral    Resp: 24 22    Height:      Weight:    66.497 kg (146 lb 9.6 oz)  SpO2: 96% 96%      Intake/Output Summary (Last 24 hours) at 10/23/15 1008 Last data filed at 10/23/15 0900  Gross per 24 hour  Intake    760 ml  Output   1500 ml  Net   -740 ml  Filed Weights   10/22/15 1715 10/23/15 0403  Weight: 68.04 kg (150 lb) 66.497 kg (146 lb 9.6 oz)    Exam: Gen:  NAD Cardiovascular:  RRR, No M/R/G Chest and lungs:   CTAB Abdomen:  Abdomen soft, NT/ND, + BS Extremities:  No edema   Data Reviewed:    Labs: Basic Metabolic Panel:  Recent Labs Lab 10/22/15 1557 10/23/15 0823  NA 140 139  K 4.9 4.6  CL 105 105  CO2 22 25  GLUCOSE 224* 184*  BUN 12 13  CREATININE 0.82 0.72  CALCIUM 9.5 9.2   GFR Estimated Creatinine Clearance: 57 mL/min (by C-G formula based on Cr of 0.72). Liver Function Tests: No results for input(s): AST, ALT, ALKPHOS, BILITOT, PROT, ALBUMIN in the last 168 hours. No results for input(s): LIPASE,  AMYLASE in the last 168 hours. No results for input(s): AMMONIA in the last 168 hours. Coagulation profile No results for input(s): INR, PROTIME in the last 168 hours.  CBC:  Recent Labs Lab 10/22/15 1557 10/23/15 0823  WBC 5.7 5.1  NEUTROABS 3.1  --   HGB 11.3* 11.2*  HCT 36.5 34.8*  MCV 90.6 91.1  PLT 179 157   Cardiac Enzymes:  Recent Labs Lab 10/22/15 2000 10/23/15 0030 10/23/15 0823  TROPONINI 0.23* 0.18* 0.19*   BNP (last 3 results) No results for input(s): PROBNP in the last 8760 hours. CBG:  Recent Labs Lab 10/22/15 1947 10/23/15 0727  GLUCAP 161* 162*   D-Dimer: No results for input(s): DDIMER in the last 72 hours. Hgb A1c: No results for input(s): HGBA1C in the last 72 hours. Lipid Profile: No results for input(s): CHOL, HDL, LDLCALC, TRIG, CHOLHDL, LDLDIRECT in the last 72 hours. Thyroid function studies: No results for input(s): TSH, T4TOTAL, T3FREE, THYROIDAB in the last 72 hours.  Invalid input(s): FREET3 Anemia work up: No results for input(s): VITAMINB12, FOLATE, FERRITIN, TIBC, IRON, RETICCTPCT in the last 72 hours. Sepsis Labs:  Recent Labs Lab 10/22/15 1557 10/23/15 0823  WBC 5.7 5.1   Microbiology No results found for this or any previous visit (from the past 240 hour(s)).   Medications:   . aspirin EC  81 mg Oral QHS  . atorvastatin  20 mg Oral QHS  . brimonidine  1 drop Both Eyes BID  . clopidogrel  75 mg Oral Daily  . insulin aspart  0-15 Units Subcutaneous TID WC  . insulin aspart  0-5 Units Subcutaneous QHS  . insulin glargine  24 Units Subcutaneous QHS  . isosorbide mononitrate  60 mg Oral QHS  . latanoprost  1 drop Both Eyes QHS  . levETIRAcetam  1,000 mg Oral BID  . magnesium oxide  400 mg Oral Daily  . metoprolol tartrate  25 mg Oral QHS  . nitroGLYCERIN  0.5 inch Topical 4 times per day  . pantoprazole  40 mg Oral Daily  . sodium chloride flush  3 mL Intravenous Q12H  . timolol  1 drop Both Eyes BID  .  vitamin B-12  1,000 mcg Oral Daily  . vitamin E  400 Units Oral Daily   Continuous Infusions: . heparin 1,050 Units/hr (10/23/15 0350)    Time spent: 25 min   LOS: 1 day   Leah Small  [facility] Pager [phone]  *Please refer to [email or web address], password [email or web address] to get updated schedule on who will round on this patient, as hospitalists switch teams weekly. If 7PM-7AM, please contact night-coverage at [email or web address], password [email or web address] for any overnight needs.  10/23/2015, 10:08 AM

## 2015-10-23 NOTE — Plan of Care (Signed)
Problem: Phase II Progression Outcomes Goal: Hemodynamically stable Outcome: Progressing Heparin drip infusing, Echocardiogram ordered.

## 2015-10-23 NOTE — Progress Notes (Signed)
ANTICOAGULATION CONSULT NOTE - Follow Up Consult  Pharmacy Consult for heparin Indication: NSTEMI  Labs:  Recent Labs  10/22/15 1557 10/22/15 2000 10/23/15 0030 10/23/15 0228  HGB 11.3*  --   --   --   HCT 36.5  --   --   --   PLT 179  --   --   --   HEPARINUNFRC  --   --   --  0.18*  CREATININE 0.82  --   --   --   TROPONINI  --  0.23* 0.18*  --      Assessment: 74yo female subtherapeutic on heparin with initial dosing for NSTEMI.  Goal of Therapy:  Heparin level 0.3-0.7 units/ml   Plan:  Will rebolus with heparin 2000 units and increase gtt by 3 units/kg/hr to 1050 units/hr and check level in 8hr.  Vernard Gambles, PharmD, BCPS  10/23/2015,3:35 AM

## 2015-10-24 LAB — CBC
HCT: 32.8 % — ABNORMAL LOW (ref 36.0–46.0)
HEMOGLOBIN: 10.1 g/dL — AB (ref 12.0–15.0)
MCH: 27.7 pg (ref 26.0–34.0)
MCHC: 30.8 g/dL (ref 30.0–36.0)
MCV: 90.1 fL (ref 78.0–100.0)
Platelets: 159 10*3/uL (ref 150–400)
RBC: 3.64 MIL/uL — AB (ref 3.87–5.11)
RDW: 14.5 % (ref 11.5–15.5)
WBC: 4.5 10*3/uL (ref 4.0–10.5)

## 2015-10-24 LAB — GLUCOSE, CAPILLARY: Glucose-Capillary: 239 mg/dL — ABNORMAL HIGH (ref 65–99)

## 2015-10-24 LAB — HEPARIN LEVEL (UNFRACTIONATED): Heparin Unfractionated: 0.68 IU/mL (ref 0.30–0.70)

## 2015-10-24 MED ORDER — INSULIN GLARGINE 100 UNIT/ML ~~LOC~~ SOLN
15.0000 [IU] | Freq: Two times a day (BID) | SUBCUTANEOUS | Status: DC
  Filled 2015-10-24: qty 0.15

## 2015-10-24 MED ORDER — ISOSORBIDE MONONITRATE ER 60 MG PO TB24: 60.0000 mg | ORAL_TABLET | Freq: Every day | ORAL | Status: DC

## 2015-10-24 MED ORDER — LEVETIRACETAM 1000 MG PO TABS: 1000.0000 mg | ORAL_TABLET | Freq: Two times a day (BID) | ORAL | Status: DC

## 2015-10-24 NOTE — Progress Notes (Signed)
Pt c/o CP 4/10.  States this is the same pain that she had yesterday before coming in.  2 SL Ntg given, CP down to 2/10.   EKG Obtained.  Will continue to monitor.

## 2015-10-24 NOTE — Plan of Care (Signed)
Problem: Education: Goal: Knowledge of [facility] Education information/materials will improve Outcome: Completed/Met Date Met:  10/24/15 Pt educated throughout entire hospitalion regarding medication, procedures, lab results, vaccines

## 2015-10-24 NOTE — Progress Notes (Signed)
Pt discharged home with son.  Reviewed discharge instructions and education, all questions answered.  Assessment unchanged from earlier.  

## 2015-10-24 NOTE — Progress Notes (Signed)
[facility] PROGRESS NOTE    Progress Note   [name]:[id number] DOB: [birth year] DOA: 10/22/2015 PCP: Marguarite Arbour, MD   Brief Narrative:   Leah Small is an 75 y.o. female past medical history of CAD with CABG in 97, unstable angina and an NSTEMI, with type 2 diabetes mellitus on insulin, history of TIA, in 2016 she came in with TIA-like symptoms diagnosed with complex arsenal seizures and started on Keppra since then she's been followed up by her neurologist, comes into the ED as she fell but she was passing out with left arm weakness, she didn't lift her left arm and shoulder pain they radiated to the back. This happened she started having chest pain, in the ED her troponins were mildly elevated, with ST segment depression in the inferior leads, cardiology was consulted and recommend cemented continue conservative management as her previous cardiac cath showed that there was no further target for revascularization  percutaneous intervention or redo  bypass.  Assessment/Plan:   Non-STEMI (non-ST elevated myocardial infarction) [facility]): She has completed 48 hours of IV heparin, will discontinue continue aspirin Plavix, nitrates, statins and metoprolol. Awaiting cardiology recommendations to see [facility] discharge her home. An episode of chest pain this morning EKG was done that showed no ST changes.  Diabetes mellitus with complication (HCC) Blood glucose still high continue long-acting insulin and  adjustments sliding scale.  Complex Partial seizures/ Left-sided weakness Continue Keppra left-sided weakness and numbness have resolved.  History of TIA: Continue aspirin Plavix and statins.  DVT Prophylaxis - Heparin.  Family Communication: son Disposition Plan: Home in hopefully today Code Status:     Code Status Orders        Start     Ordered   10/22/15 1824  Do not attempt resuscitation (DNR)   Continuous    Question Answer Comment  In the event of  cardiac or respiratory ARREST Do not call a "code blue"   In the event of cardiac or respiratory ARREST Do not perform Intubation, CPR, defibrillation or ACLS   In the event of cardiac or respiratory ARREST Use medication by any route, position, wound care, and other measures to relive pain and suffering. May use oxygen, suction and manual treatment of airway obstruction as needed for comfort.      10/22/15 1823    Code Status History    Date Active Date Inactive Code Status Order ID Comments User Context   07/06/2015  6:04 PM 07/10/2015  4:56 PM Full Code [id number]  Leah Rocks, MD ED        IV Access:    Peripheral IV   Procedures and diagnostic studies:   No results found.   Medical Consultants:    None.  Anti-Infectives:   Anti-infectives    None      Subjective:    Leah Small she had chest pain exam.  Objective:    Filed Vitals:   10/23/15 2054 10/23/15 2100 10/24/15 0500 10/24/15 0927  BP: 159/69 149/68 118/50 146/58  Pulse: 74 68 53 57  Temp:   97.7 F (36.5 C)   TempSrc:   Oral   Resp:      Height:      Weight:   66.497 kg (146 lb 9.6 oz)   SpO2:   97% 98%    Intake/Output Summary (Last 24 hours) at 10/24/15 1015 Last data filed at 10/24/15 0658  Gross per 24 hour  Intake 830.97 ml  Output  0 ml  Net 830.97 ml   Filed Weights   10/22/15 1715 10/23/15 0403 10/24/15 0500  Weight: 68.04 kg (150 lb) 66.497 kg (146 lb 9.6 oz) 66.497 kg (146 lb 9.6 oz)    Exam: Gen:  NAD Cardiovascular:  RRR. Chest and lungs:   CTAB Abdomen:  Abdomen soft, NT/ND, + BS Extremities:  No edema   Data Reviewed:    Labs: Basic Metabolic Panel:  Recent Labs Lab 10/22/15 1557 10/23/15 0823  NA 140 139  K 4.9 4.6  CL 105 105  CO2 22 25  GLUCOSE 224* 184*  BUN 12 13  CREATININE 0.82 0.72  CALCIUM 9.5 9.2   GFR Estimated Creatinine Clearance: 57 mL/min (by C-G formula based on Cr of 0.72). Liver Function Tests: No results for  input(s): AST, ALT, ALKPHOS, BILITOT, PROT, ALBUMIN in the last 168 hours. No results for input(s): LIPASE, AMYLASE in the last 168 hours. No results for input(s): AMMONIA in the last 168 hours. Coagulation profile No results for input(s): INR, PROTIME in the last 168 hours.  CBC:  Recent Labs Lab 10/22/15 1557 10/23/15 0823 10/24/15 0504  WBC 5.7 5.1 4.5  NEUTROABS 3.1  --   --   HGB 11.3* 11.2* 10.1*  HCT 36.5 34.8* 32.8*  MCV 90.6 91.1 90.1  PLT 179 157 159   Cardiac Enzymes:  Recent Labs Lab 10/22/15 2000 10/23/15 0030 10/23/15 0823  TROPONINI 0.23* 0.18* 0.19*   BNP (last 3 results) No results for input(s): PROBNP in the last 8760 hours. CBG:  Recent Labs Lab 10/23/15 0727 10/23/15 1135 10/23/15 1628 10/23/15 2113 10/24/15 0736  GLUCAP 162* 213* 127* 220* 239*   D-Dimer: No results for input(s): DDIMER in the last 72 hours. Hgb A1c: No results for input(s): HGBA1C in the last 72 hours. Lipid Profile: No results for input(s): CHOL, HDL, LDLCALC, TRIG, CHOLHDL, LDLDIRECT in the last 72 hours. Thyroid function studies: No results for input(s): TSH, T4TOTAL, T3FREE, THYROIDAB in the last 72 hours.  Invalid input(s): FREET3 Anemia work up: No results for input(s): VITAMINB12, FOLATE, FERRITIN, TIBC, IRON, RETICCTPCT in the last 72 hours. Sepsis Labs:  Recent Labs Lab 10/22/15 1557 10/23/15 0823 10/24/15 0504  WBC 5.7 5.1 4.5   Microbiology No results found for this or any previous visit (from the past 240 hour(s)).   Medications:   . aspirin EC  81 mg Oral QHS  . atorvastatin  20 mg Oral QHS  . brimonidine  1 drop Both Eyes BID  . clopidogrel  75 mg Oral Daily  . insulin aspart  0-15 Units Subcutaneous TID WC  . insulin aspart  0-5 Units Subcutaneous QHS  . insulin glargine  24 Units Subcutaneous QHS  . isosorbide mononitrate  60 mg Oral QHS  . latanoprost  1 drop Both Eyes QHS  . levETIRAcetam  1,000 mg Oral BID  . magnesium oxide  400  mg Oral Daily  . metoprolol tartrate  25 mg Oral QHS  . nitroGLYCERIN  0.5 inch Topical 4 times per day  . pantoprazole  40 mg Oral Daily  . sodium chloride flush  3 mL Intravenous Q12H  . timolol  1 drop Both Eyes BID  . vitamin B-12  1,000 mcg Oral Daily  . vitamin E  400 Units Oral Daily   Continuous Infusions: . heparin 1,050 Units/hr (10/23/15 1220)    Time spent: 15 min   LOS: 2 days   Marinda Elk  [facility] Pager [phone]  *  Please refer to [email or web address], password [email or web address] to get updated schedule on who will round on this patient, as hospitalists switch teams weekly. If 7PM-7AM, please contact night-coverage at [email or web address], password [email or web address] for any overnight needs.  10/24/2015, 10:15 AM

## 2015-10-24 NOTE — Discharge Summary (Signed)
Physician Discharge Summary  Leah Small:[id number] DOB: [birth year] DOA: 10/22/2015  PCP: Marguarite Arbour, MD  Admit date: 10/22/2015 Discharge date: 10/24/2015  Time spent: 35 minutes  Recommendations for Outpatient Follow-up:  1. Follow-up with cardiology in 2 weeks titrate antianginal medications as tolerated. 2. Follow-up with primary care doctor in 4-6 weeks we'll need to discuss Palliative care options  Discharge Diagnoses:  Principal Problem:   Non-STEMI (non-ST elevated myocardial infarction) (HCC) Active Problems:   Diabetes mellitus with complication (HCC)   Chest pain   Left-sided weakness   Confusional state   NSTEMI (non-ST elevated myocardial infarction) (HCC)   Complex partial seizure disorder (HCC)   Coronary atherosclerosis of native coronary artery   Discharge Condition: stable  Diet recommendation: heart healthy  Filed Weights   10/22/15 1715 10/23/15 0403 10/24/15 0500  Weight: 68.04 kg (150 lb) 66.497 kg (146 lb 9.6 oz) 66.497 kg (146 lb 9.6 oz)    History of present illness:  75 y.o. female past medical history of CAD with CABG in 97, unstable angina and an NSTEMI, with type 2 diabetes mellitus on insulin, history of TIA, in 2016 she came in with TIA-like symptoms diagnosed with complex arsenal seizures and started on Keppra since then she's been followed up by her neurologist, comes into the ED as she fell but she was passing out with left arm weakness, she didn't lift her left arm and shoulder pain they radiated to the back. This happened she started having chest pain, in the ED her troponins were mildly elevated, with ST segment depression in the inferior leads, cardiology was consulted and recommend cemented continue conservative management as her previous cardiac cath showed that there was no further target for revascularization percutaneous intervention or redo bypass.  Hospital Course:  Non-ST elevation MI: She was started on IV heparin and  continue on aspirin and Plavix nitrates statins and beta blockers. Cardiology was consulted who recommended to continue treatment for 48 hours in house. Committed to continued at home nitroglycerin sublingual for chest pain. She'll follow-up with her cardiologist in 2-4 weeks will titrate antianginal medication as an outpatient.  Diabetes mellitus with peripheral vascular complications: Her insulin was titrated up. She will follow-up with her primary care doctor in 4-6 weeks. We'll check an A1c and continue titrate insulin as tolerated.  History of TIA: She was continue aspirin Plavix and statins.  Complex partial seizures: It was discussed with neurologist and recommended to increase her Keppra.  There is no further episodes of seizure events.   Procedures:  2-D echo on 10/23/2015 that showed diastolic heart failure  Consultations:  Cards  Discharge Exam: Filed Vitals:   10/24/15 0500 10/24/15 0927  BP: 118/50 146/58  Pulse: 53 57  Temp: 97.7 F (36.5 C)   Resp:      General: A&O x3 Cardiovascular: RRR Respiratory: good air movement CTA B/L  Discharge Instructions   Discharge Instructions    Diet - low sodium heart healthy    Complete by:  As directed      Increase activity slowly    Complete by:  As directed           Current Discharge Medication List    START taking these medications   Details  levETIRAcetam (KEPPRA) 1000 MG tablet Take 1 tablet (1,000 mg total) by mouth 2 (two) times daily. Qty: 60 tablet, Refills: 3      CONTINUE these medications which have NOT CHANGED   Details  aspirin EC 81  MG tablet Take 81 mg by mouth at bedtime.    atorvastatin (LIPITOR) 40 MG tablet Take 1 tablet (40 mg total) by mouth at bedtime. Qty: 30 tablet, Refills: 1    bimatoprost (LUMIGAN) 0.01 % SOLN Place 1 drop into both eyes at bedtime.    brimonidine (ALPHAGAN) 0.2 % ophthalmic solution Place 1 drop into both eyes 2 (two) times daily.     CALCIUM PO Take 1  tablet by mouth daily.    clopidogrel (PLAVIX) 75 MG tablet Take 75 mg by mouth daily.    glipiZIDE (GLUCOTROL XL) 10 MG 24 hr tablet Take 10 mg by mouth every morning.    Insulin Glargine (LANTUS SOLOSTAR) 100 UNIT/ML Solostar Pen Inject 24 Units into the skin at bedtime.    Insulin Lispro Prot & Lispro (HUMALOG MIX 75/25 KWIKPEN) (75-25) 100 UNIT/ML Kwikpen Inject 6-12 Units into the skin 4 (four) times daily - after meals and at bedtime. Per sliding scale: 6 units plus:   CBG 150-200 add 1 unit, 201-250 add 2 units, 251-300 add 3 units, 301-350 add 4 units 351-400 add 5 units, >400 add 6 units and call MD    isosorbide mononitrate (IMDUR) 60 MG 24 hr tablet Take 60 mg by mouth daily.    MAGNESIUM PO Take 1 tablet by mouth daily.    metFORMIN (GLUCOPHAGE) 1000 MG tablet Take 1,000 mg by mouth 2 (two) times daily.    metoprolol tartrate (LOPRESSOR) 25 MG tablet Take 25 mg by mouth at bedtime.    Multiple Vitamins-Minerals (ZINC PO) Take 1 tablet by mouth daily.    pantoprazole (PROTONIX) 40 MG tablet Take 1 tablet (40 mg total) by mouth daily. Qty: 30 tablet, Refills: 1    timolol (BETIMOL) 0.5 % ophthalmic solution Place 1 drop into both eyes 2 (two) times daily.    triamcinolone ointment (KENALOG) 0.1 % Apply 1 application topically 3 (three) times daily as needed (itching from psoriasis).    vitamin B-12 (CYANOCOBALAMIN) 1000 MCG tablet Take 1,000 mcg by mouth daily.    vitamin E 400 UNIT capsule Take 400 Units by mouth daily.    nitroGLYCERIN (NITROSTAT) 0.4 MG SL tablet Place 0.4 mg under the tongue every 5 (five) minutes as needed for chest pain.    nystatin cream (MYCOSTATIN) Apply 1 application topically 2 (two) times daily as needed (yeast infection).       STOP taking these medications     levETIRAcetam (KEPPRA XR) 500 MG 24 hr tablet      acetaminophen (TYLENOL ARTHRITIS PAIN) 650 MG CR tablet        Allergies  Allergen Reactions  . Vicodin  [Hydrocodone-Acetaminophen] Nausea And Vomiting  . Talwin [Pentazocine]   . Valium [Diazepam] Other (See Comments)    Makes her feel like she's flying   Follow-up Information    Follow up with SPARKS,JEFFREY D, MD In 6 weeks.   Specialty:  Internal Medicine   Contact information:   [address] [place] [facility] [place] [address] [phone]        The results of significant diagnostics from this hospitalization (including imaging, microbiology, ancillary and laboratory) are listed below for reference.    Significant Diagnostic Studies: No results found.  Microbiology: No results found for this or any previous visit (from the past 240 hour(s)).   Labs: Basic Metabolic Panel:  Recent Labs Lab 10/22/15 1557 10/23/15 0823  NA 140 139  K 4.9 4.6  CL 105 105  CO2 22  25  GLUCOSE 224* 184*  BUN 12 13  CREATININE 0.82 0.72  CALCIUM 9.5 9.2   Liver Function Tests: No results for input(s): AST, ALT, ALKPHOS, BILITOT, PROT, ALBUMIN in the last 168 hours. No results for input(s): LIPASE, AMYLASE in the last 168 hours. No results for input(s): AMMONIA in the last 168 hours. CBC:  Recent Labs Lab 10/22/15 1557 10/23/15 0823 10/24/15 0504  WBC 5.7 5.1 4.5  NEUTROABS 3.1  --   --   HGB 11.3* 11.2* 10.1*  HCT 36.5 34.8* 32.8*  MCV 90.6 91.1 90.1  PLT 179 157 159   Cardiac Enzymes:  Recent Labs Lab 10/22/15 2000 10/23/15 0030 10/23/15 0823  TROPONINI 0.23* 0.18* 0.19*   BNP: BNP (last 3 results) No results for input(s): BNP in the last 8760 hours.  ProBNP (last 3 results) No results for input(s): PROBNP in the last 8760 hours.  CBG:  Recent Labs Lab 10/23/15 0727 10/23/15 1135 10/23/15 1628 10/23/15 2113 10/24/15 0736  GLUCAP 162* 213* 127* 220* 239*     Signed:  Marinda Elk MD.  [facility] 10/24/2015, 10:30 AM

## 2015-10-24 NOTE — Plan of Care (Signed)
Problem: Consults Goal: Chest Pain Patient Education (See Patient Education module for education specifics.)  Outcome: Completed/Met Date Met:  10/24/15 Pt received education regarding chest pain   Problem: Phase III Progression Outcomes Goal: No anginal pain Outcome: Completed/Met Date Met:  10/24/15 Pt remains free from chest pain at this time Goal: Discharge plan remains appropriate-arrangements made Outcome: Completed/Met Date Met:  10/24/15 Pt should be discharged home today Goal: Tolerating diet Outcome: Completed/Met Date Met:  10/24/15 Pt able to tolerate current diet with no difficulties  Problem: Discharge Progression Outcomes Goal: No anginal pain Outcome: Completed/Met Date Met:  10/24/15 Pt remains free from chest pain at this time Goal: Hemodynamically stable Outcome: Completed/Met Date Met:  10/24/15 Pt remains hemodynamically stable at this time Goal: Discharge plan in place and appropriate Outcome: Completed/Met Date Met:  10/24/15 Pt should be discharged home today Goal: Tolerates diet Outcome: Completed/Met Date Met:  10/24/15 Pt able to tolerate current diet with no difficulties

## 2015-10-24 NOTE — Progress Notes (Signed)
Cardiologist: Leah Small Subjective:  Feels better, no chest pain, no shortness of breath. Had an episode of chest pain overnight. Fleeting. She thinks it was because she was trying to push herself up in the bed with the hand rails. She took 2 nitroglycerin and this resolved the pain.  Objective:  Vital Signs in the last 24 hours: Temp:  [97.7 F (36.5 C)-97.9 F (36.6 C)] 97.7 F (36.5 C) (03/02 0500) Pulse Rate:  [52-74] 57 (03/02 0927) Resp:  [15] 15 (03/01 2044) BP: (108-159)/(50-72) 146/58 mmHg (03/02 0927) SpO2:  [97 %-99 %] 98 % (03/02 0927) Weight:  [146 lb 9.6 oz (66.497 kg)] 146 lb 9.6 oz (66.497 kg) (03/02 0500)  Intake/Output from previous day: 03/01 0701 - 03/02 0700 In: 1191 [P.O.:840; I.V.:351] Out: -    Physical Exam: General: Well developed, well nourished, in no acute distress. Head:  Normocephalic and atraumatic. Lungs: Clear to auscultation and percussion. Heart: Normal S1 and S2.  No murmur, rubs or gallops.  Abdomen: soft, non-tender, positive bowel sounds. Extremities: No clubbing or cyanosis. No edema. Neurologic: Alert and oriented x 3. Skin: Malar rash    Lab Results:  Recent Labs  10/23/15 0823 10/24/15 0504  WBC 5.1 4.5  HGB 11.2* 10.1*  PLT 157 159    Recent Labs  10/22/15 1557 10/23/15 0823  NA 140 139  K 4.9 4.6  CL 105 105  CO2 22 25  GLUCOSE 224* 184*  BUN 12 13  CREATININE 0.82 0.72    Recent Labs  10/23/15 0030 10/23/15 0823  TROPONINI 0.18* 0.19*   Telemetry: No adverse arrhythmias Personally viewed.   EKG:  Abnormal EKG at baseline with T-wave inversion/ST depression inferior laterally with subtle elevation anterior. No change from prior. Personally viewed.  Cardiac Studies:  Echo pending  Meds: Scheduled Meds: . aspirin EC  81 mg Oral QHS  . atorvastatin  20 mg Oral QHS  . brimonidine  1 drop Both Eyes BID  . clopidogrel  75 mg Oral Daily  . insulin aspart  0-15 Units Subcutaneous TID WC  . insulin  aspart  0-5 Units Subcutaneous QHS  . insulin glargine  15 Units Subcutaneous BID  . isosorbide mononitrate  60 mg Oral QHS  . latanoprost  1 drop Both Eyes QHS  . levETIRAcetam  1,000 mg Oral BID  . magnesium oxide  400 mg Oral Daily  . metoprolol tartrate  25 mg Oral QHS  . nitroGLYCERIN  0.5 inch Topical 4 times per day  . pantoprazole  40 mg Oral Daily  . sodium chloride flush  3 mL Intravenous Q12H  . timolol  1 drop Both Eyes BID  . vitamin B-12  1,000 mcg Oral Daily  . vitamin E  400 Units Oral Daily   Continuous Infusions: . heparin 1,050 Units/hr (10/23/15 1220)   PRN Meds:.acetaminophen **OR** acetaminophen, nitroGLYCERIN, ondansetron **OR** ondansetron (ZOFRAN) IV  ECHO: 10/23/15 - Left ventricle: The cavity size was mildly dilated. Systolic function was normal. The estimated ejection fraction was in the range of 50% to 55%. Wall motion was normal; there were no regional wall motion abnormalities. Doppler parameters are consistent with a reversible restrictive pattern, indicative of decreased left ventricular diastolic compliance and/or increased left atrial pressure (grade 3 diastolic dysfunction). Doppler parameters are consistent with high ventricular filling pressure. - Aortic valve: Transvalvular velocity was within the normal range. There was no stenosis. There was no regurgitation. - Mitral valve: Severely calcified annulus. Transvalvular velocity was within the normal  range. There was no evidence for stenosis. There was moderate regurgitation. - Left atrium: The atrium was moderately to severely dilated. - Right ventricle: The cavity size was normal. Wall thickness was normal. Systolic function was normal. - Tricuspid valve: There was moderate-severe regurgitation. - Pulmonary arteries: Systolic pressure was severely increased. PA peak pressure: 63 mm Hg (S). - Inferior vena cava: The vessel was normal in size. The respirophasic  diameter changes were in the normal range (>= 50%),consistent with normal central venous pressure.  Assessment/Plan:  Principal Problem:   Non-STEMI (non-ST elevated myocardial infarction) (HCC) Active Problems:   Diabetes mellitus with complication (HCC)   Chest pain   Left-sided weakness   Confusional state   NSTEMI (non-ST elevated myocardial infarction) (HCC)   Complex partial seizure disorder (HCC)   Coronary atherosclerosis of native coronary artery   NSTEMI - mild low level troponin - no further chest pain, feeling well. - Stopping IV heparin today  - echocardiogram reassuring ejection fraction. Moderate MR. Pulmonary pressures are elevated, possibly secondary to diastolic dysfunction continue with aggressive medical therapy. - Continue with aggressive medical therapy. Meds reviewed as above. - Not a candidate for further percutaneous interventions.  She will follow-up with Dr. Malachy Mood, Ugo Thoma 10/24/2015, 10:34 AM

## 2015-10-24 NOTE — Progress Notes (Signed)
ANTICOAGULATION CONSULT NOTE - Follow Up Consult  Pharmacy Consult for heparin Indication: NSTEMI  Labs:  Recent Labs  10/22/15 1557 10/22/15 2000 10/23/15 0030  10/23/15 0823 10/23/15 1033 10/23/15 1855 10/24/15 0500 10/24/15 0504  HGB 11.3*  --   --   --  11.2*  --   --   --  10.1*  HCT 36.5  --   --   --  34.8*  --   --   --  32.8*  PLT 179  --   --   --  157  --   --   --  159  HEPARINUNFRC  --   --   --   < >  --  0.62 0.41 0.68  --   CREATININE 0.82  --   --   --  0.72  --   --   --   --   TROPONINI  --  0.23* 0.18*  --  0.19*  --   --   --   --   < > = values in this interval not displayed.   Assessment: 74yo female continuing on heparin with initial dosing for NSTEMI. Previously told no further revascularization options per MD note. HL remains therapeutic (0.68) on 1050 units/h. Hg down to 10.1, plt wnl, no bleed issues reported. Plan to d/c heparin today per Cards note.  Goal of Therapy:  Heparin level 0.3-0.7 units/ml   Plan:  Continue Heparin infusion at 1050 units/hr Daily Heparin level/CBC Monitor s/sx bleeding Heparin 24-48h for NSTEMI with no further revascularization options  Babs Bertin, PharmD, [facility] Clinical Pharmacist Pager [phone] 10/24/2015 8:32 AM

## 2015-11-20 ENCOUNTER — Encounter (HOSPITAL_COMMUNITY): Payer: Self-pay | Admitting: Emergency Medicine

## 2015-11-20 ENCOUNTER — Inpatient Hospital Stay (HOSPITAL_COMMUNITY)
Admission: EM | Admit: 2015-11-20 | Discharge: 2015-11-22 | DRG: 247 | Disposition: A | Discharge status: Home / Self Care | Payer: Medicare Other | Attending: Internal Medicine | Admitting: Internal Medicine

## 2015-11-20 ENCOUNTER — Emergency Department (HOSPITAL_COMMUNITY): Payer: Medicare Other

## 2015-11-20 DIAGNOSIS — I252 Old myocardial infarction: Secondary | ICD-10-CM

## 2015-11-20 DIAGNOSIS — Z8673 Personal history of transient ischemic attack (TIA), and cerebral infarction without residual deficits: Secondary | ICD-10-CM

## 2015-11-20 DIAGNOSIS — Z66 Do not resuscitate: Secondary | ICD-10-CM | POA: Diagnosis present

## 2015-11-20 DIAGNOSIS — E118 Type 2 diabetes mellitus with unspecified complications: Secondary | ICD-10-CM | POA: Diagnosis not present

## 2015-11-20 DIAGNOSIS — I200 Unstable angina: Secondary | ICD-10-CM

## 2015-11-20 DIAGNOSIS — I10 Essential (primary) hypertension: Secondary | ICD-10-CM

## 2015-11-20 DIAGNOSIS — Z7902 Long term (current) use of antithrombotics/antiplatelets: Secondary | ICD-10-CM | POA: Diagnosis not present

## 2015-11-20 DIAGNOSIS — L4050 Arthropathic psoriasis, unspecified: Secondary | ICD-10-CM | POA: Diagnosis present

## 2015-11-20 DIAGNOSIS — E119 Type 2 diabetes mellitus without complications: Secondary | ICD-10-CM | POA: Diagnosis present

## 2015-11-20 DIAGNOSIS — Z951 Presence of aortocoronary bypass graft: Secondary | ICD-10-CM | POA: Diagnosis not present

## 2015-11-20 DIAGNOSIS — Z79899 Other long term (current) drug therapy: Secondary | ICD-10-CM | POA: Diagnosis not present

## 2015-11-20 DIAGNOSIS — I214 Non-ST elevation (NSTEMI) myocardial infarction: Secondary | ICD-10-CM

## 2015-11-20 DIAGNOSIS — E785 Hyperlipidemia, unspecified: Secondary | ICD-10-CM

## 2015-11-20 DIAGNOSIS — I209 Angina pectoris, unspecified: Secondary | ICD-10-CM | POA: Diagnosis not present

## 2015-11-20 DIAGNOSIS — G40209 Localization-related (focal) (partial) symptomatic epilepsy and epileptic syndromes with complex partial seizures, not intractable, without status epilepticus: Secondary | ICD-10-CM

## 2015-11-20 DIAGNOSIS — I2510 Atherosclerotic heart disease of native coronary artery without angina pectoris: Secondary | ICD-10-CM | POA: Diagnosis not present

## 2015-11-20 DIAGNOSIS — I25110 Atherosclerotic heart disease of native coronary artery with unstable angina pectoris: Secondary | ICD-10-CM | POA: Diagnosis present

## 2015-11-20 DIAGNOSIS — F4489 Other dissociative and conversion disorders: Secondary | ICD-10-CM

## 2015-11-20 DIAGNOSIS — Z823 Family history of stroke: Secondary | ICD-10-CM | POA: Diagnosis not present

## 2015-11-20 DIAGNOSIS — I222 Subsequent non-ST elevation (NSTEMI) myocardial infarction: Secondary | ICD-10-CM | POA: Diagnosis present

## 2015-11-20 DIAGNOSIS — Z888 Allergy status to other drugs, medicaments and biological substances status: Secondary | ICD-10-CM | POA: Diagnosis not present

## 2015-11-20 DIAGNOSIS — I160 Hypertensive urgency: Secondary | ICD-10-CM | POA: Diagnosis present

## 2015-11-20 DIAGNOSIS — R2990 Unspecified symptoms and signs involving the nervous system: Secondary | ICD-10-CM

## 2015-11-20 DIAGNOSIS — Z794 Long term (current) use of insulin: Secondary | ICD-10-CM

## 2015-11-20 DIAGNOSIS — Z7982 Long term (current) use of aspirin: Secondary | ICD-10-CM

## 2015-11-20 DIAGNOSIS — E7800 Pure hypercholesterolemia, unspecified: Secondary | ICD-10-CM | POA: Diagnosis present

## 2015-11-20 DIAGNOSIS — R531 Weakness: Secondary | ICD-10-CM

## 2015-11-20 DIAGNOSIS — Z885 Allergy status to narcotic agent status: Secondary | ICD-10-CM

## 2015-11-20 DIAGNOSIS — K219 Gastro-esophageal reflux disease without esophagitis: Secondary | ICD-10-CM | POA: Diagnosis present

## 2015-11-20 DIAGNOSIS — Z8249 Family history of ischemic heart disease and other diseases of the circulatory system: Secondary | ICD-10-CM

## 2015-11-20 DIAGNOSIS — R413 Other amnesia: Secondary | ICD-10-CM

## 2015-11-20 DIAGNOSIS — Z955 Presence of coronary angioplasty implant and graft: Secondary | ICD-10-CM | POA: Diagnosis not present

## 2015-11-20 LAB — I-STAT TROPONIN, ED: Troponin i, poc: 0.03 ng/mL (ref 0.00–0.08)

## 2015-11-20 LAB — BASIC METABOLIC PANEL
ANION GAP: 13 (ref 5–15)
BUN: 12 mg/dL (ref 6–20)
CHLORIDE: 101 mmol/L (ref 101–111)
CO2: 22 mmol/L (ref 22–32)
Calcium: 9.8 mg/dL (ref 8.9–10.3)
Creatinine, Ser: 0.74 mg/dL (ref 0.44–1.00)
GFR calc Af Amer: >60 mL/min (ref >60)
GFR calc non Af Amer: >60 mL/min (ref >60)
Glucose, Bld: 254 mg/dL — ABNORMAL HIGH (ref 65–99)
POTASSIUM: 4.4 mmol/L (ref 3.5–5.1)
Sodium: 136 mmol/L (ref 135–145)

## 2015-11-20 LAB — CBC
HEMATOCRIT: 38.5 % (ref 36.0–46.0)
HEMOGLOBIN: 12.3 g/dL (ref 12.0–15.0)
MCH: 28.4 pg (ref 26.0–34.0)
MCHC: 31.9 g/dL (ref 30.0–36.0)
MCV: 88.9 fL (ref 78.0–100.0)
Platelets: 184 10*3/uL (ref 150–400)
RBC: 4.33 MIL/uL (ref 3.87–5.11)
RDW: 14.3 % (ref 11.5–15.5)
WBC: 6.2 10*3/uL (ref 4.0–10.5)

## 2015-11-20 LAB — T4, FREE: Free T4: 0.78 ng/dL (ref 0.61–1.12)

## 2015-11-20 LAB — BRAIN NATRIURETIC PEPTIDE: B NATRIURETIC PEPTIDE 5: 177.1 pg/mL — AB (ref 0.0–100.0)

## 2015-11-20 LAB — TROPONIN I: Troponin I: 0.45 ng/mL — ABNORMAL HIGH (ref <0.031)

## 2015-11-20 LAB — GLUCOSE, CAPILLARY: Glucose-Capillary: 94 mg/dL (ref 65–99)

## 2015-11-20 MED ORDER — LEVETIRACETAM 750 MG PO TABS
750.0000 mg | ORAL_TABLET | Freq: Two times a day (BID) | ORAL | Status: DC
  Administered 2015-11-21 – 2015-11-22 (×3): 750 mg via ORAL
  Filled 2015-11-20 (×4): qty 1

## 2015-11-20 MED ORDER — NITROGLYCERIN IN D5W 200-5 MCG/ML-% IV SOLN
3.0000 ug/min | INTRAVENOUS | Status: DC
Start: 2015-11-20 — End: 2015-11-22
  Administered 2015-11-20: 5 ug/min via INTRAVENOUS
  Filled 2015-11-20: qty 250

## 2015-11-20 MED ORDER — FAMOTIDINE 20 MG PO TABS
20.0000 mg | ORAL_TABLET | Freq: Once | ORAL | Status: AC
  Administered 2015-11-20: 20 mg via ORAL
  Filled 2015-11-20: qty 1

## 2015-11-20 MED ORDER — METOPROLOL TARTRATE 1 MG/ML IV SOLN: 5.0000 mg | INTRAVENOUS | Status: DC | PRN

## 2015-11-20 MED ORDER — ATORVASTATIN CALCIUM 20 MG PO TABS
20.0000 mg | ORAL_TABLET | Freq: Every day | ORAL | Status: DC
  Filled 2015-11-20: qty 2

## 2015-11-20 MED ORDER — TIMOLOL MALEATE 0.5 % OP SOLN
1.0000 [drp] | Freq: Two times a day (BID) | OPHTHALMIC | Status: DC
  Administered 2015-11-21 (×2): 1 [drp] via OPHTHALMIC
  Filled 2015-11-20: qty 5

## 2015-11-20 MED ORDER — HEPARIN SODIUM (PORCINE) 5000 UNIT/ML IJ SOLN
5000.0000 [IU] | Freq: Three times a day (TID) | INTRAMUSCULAR | Status: DC
  Administered 2015-11-20: 5000 [IU] via SUBCUTANEOUS
  Filled 2015-11-20: qty 1

## 2015-11-20 MED ORDER — MORPHINE SULFATE (PF) 2 MG/ML IV SOLN
2.0000 mg | Freq: Once | INTRAVENOUS | Status: AC
Start: 2015-11-20 — End: 2015-11-20
  Administered 2015-11-20: 2 mg via INTRAVENOUS
  Filled 2015-11-20: qty 1

## 2015-11-20 MED ORDER — VITAMIN E 180 MG (400 UNIT) PO CAPS
400.0000 [IU] | ORAL_CAPSULE | Freq: Every day | ORAL | Status: DC
  Administered 2015-11-21 – 2015-11-22 (×2): 400 [IU] via ORAL
  Filled 2015-11-20 (×3): qty 1

## 2015-11-20 MED ORDER — ZOLPIDEM TARTRATE 5 MG PO TABS: 5.0000 mg | ORAL_TABLET | Freq: Every evening | ORAL | Status: DC | PRN

## 2015-11-20 MED ORDER — BRIMONIDINE TARTRATE 0.2 % OP SOLN
1.0000 [drp] | Freq: Two times a day (BID) | OPHTHALMIC | Status: DC
  Administered 2015-11-21 (×2): 1 [drp] via OPHTHALMIC
  Filled 2015-11-20: qty 5

## 2015-11-20 MED ORDER — INSULIN GLARGINE 100 UNIT/ML ~~LOC~~ SOLN
20.0000 [IU] | Freq: Every day | SUBCUTANEOUS | Status: DC
  Administered 2015-11-21 (×2): 20 [IU] via SUBCUTANEOUS
  Filled 2015-11-20 (×5): qty 0.2

## 2015-11-20 MED ORDER — ONDANSETRON HCL 4 MG/2ML IJ SOLN: 4.0000 mg | Freq: Four times a day (QID) | INTRAMUSCULAR | Status: DC | PRN

## 2015-11-20 MED ORDER — INSULIN ASPART 100 UNIT/ML ~~LOC~~ SOLN
0.0000 [IU] | Freq: Three times a day (TID) | SUBCUTANEOUS | Status: DC
  Administered 2015-11-22: 06:00:00 2 [IU] via SUBCUTANEOUS

## 2015-11-20 MED ORDER — CALCIUM CARBONATE 1250 (500 CA) MG PO TABS
1.0000 | ORAL_TABLET | Freq: Every day | ORAL | Status: DC
  Administered 2015-11-21 – 2015-11-22 (×2): 500 mg via ORAL
  Filled 2015-11-20 (×3): qty 1

## 2015-11-20 MED ORDER — VITAMIN B-12 1000 MCG PO TABS
1000.0000 ug | ORAL_TABLET | Freq: Every day | ORAL | Status: DC
  Administered 2015-11-21 – 2015-11-22 (×2): 1000 ug via ORAL
  Filled 2015-11-20 (×3): qty 1

## 2015-11-20 MED ORDER — TIMOLOL HEMIHYDRATE 0.5 % OP SOLN: 1.0000 [drp] | Freq: Two times a day (BID) | OPHTHALMIC | Status: DC

## 2015-11-20 MED ORDER — ASPIRIN EC 81 MG PO TBEC
81.0000 mg | DELAYED_RELEASE_TABLET | Freq: Every day | ORAL | Status: DC
Start: 2015-11-21 — End: 2015-11-22
  Administered 2015-11-21: 81 mg via ORAL
  Filled 2015-11-20 (×2): qty 1

## 2015-11-20 MED ORDER — SODIUM CHLORIDE 0.9 % IV SOLN: 250.0000 mL | INTRAVENOUS | Status: DC | PRN

## 2015-11-20 MED ORDER — ISOSORBIDE MONONITRATE ER 60 MG PO TB24
60.0000 mg | ORAL_TABLET | Freq: Every day | ORAL | Status: DC
  Filled 2015-11-20: qty 1

## 2015-11-20 MED ORDER — SODIUM CHLORIDE 0.9% FLUSH: 3.0000 mL | INTRAVENOUS | Status: DC | PRN

## 2015-11-20 MED ORDER — TRIAMCINOLONE ACETONIDE 0.1 % EX OINT
1.0000 "application " | TOPICAL_OINTMENT | Freq: Three times a day (TID) | CUTANEOUS | Status: DC | PRN
  Filled 2015-11-20: qty 15

## 2015-11-20 MED ORDER — PANTOPRAZOLE SODIUM 40 MG PO TBEC
40.0000 mg | DELAYED_RELEASE_TABLET | Freq: Every day | ORAL | Status: DC
  Administered 2015-11-21 – 2015-11-22 (×2): 40 mg via ORAL
  Filled 2015-11-20 (×2): qty 1

## 2015-11-20 MED ORDER — LATANOPROST 0.005 % OP SOLN
1.0000 [drp] | Freq: Every day | OPHTHALMIC | Status: DC
  Administered 2015-11-21 (×2): 1 [drp] via OPHTHALMIC
  Filled 2015-11-20: qty 2.5

## 2015-11-20 MED ORDER — ACETAMINOPHEN 325 MG PO TABS
650.0000 mg | ORAL_TABLET | ORAL | Status: DC | PRN
  Administered 2015-11-21: 19:00:00 650 mg via ORAL
  Filled 2015-11-20: qty 2

## 2015-11-20 MED ORDER — SODIUM CHLORIDE 0.9% FLUSH
3.0000 mL | Freq: Two times a day (BID) | INTRAVENOUS | Status: DC
  Administered 2015-11-20 – 2015-11-21 (×2): 3 mL via INTRAVENOUS

## 2015-11-20 MED ORDER — METOPROLOL TARTRATE 12.5 MG HALF TABLET
12.5000 mg | ORAL_TABLET | Freq: Two times a day (BID) | ORAL | Status: DC
  Administered 2015-11-21 – 2015-11-22 (×3): 12.5 mg via ORAL
  Filled 2015-11-20 (×3): qty 1

## 2015-11-20 MED ORDER — MAGNESIUM OXIDE 400 (241.3 MG) MG PO TABS
200.0000 mg | ORAL_TABLET | Freq: Every day | ORAL | Status: DC
Start: 2015-11-21 — End: 2015-11-22
  Administered 2015-11-21 – 2015-11-22 (×2): 200 mg via ORAL
  Filled 2015-11-20 (×2): qty 1

## 2015-11-20 MED ORDER — PANTOPRAZOLE SODIUM 40 MG IV SOLR
40.0000 mg | Freq: Once | INTRAVENOUS | Status: AC
  Administered 2015-11-20: 40 mg via INTRAVENOUS
  Filled 2015-11-20: qty 40

## 2015-11-20 MED ORDER — CLOPIDOGREL BISULFATE 75 MG PO TABS
75.0000 mg | ORAL_TABLET | Freq: Every day | ORAL | Status: DC
  Administered 2015-11-21 – 2015-11-22 (×2): 75 mg via ORAL
  Filled 2015-11-20 (×2): qty 1

## 2015-11-20 MED ORDER — ATORVASTATIN CALCIUM 80 MG PO TABS
80.0000 mg | ORAL_TABLET | Freq: Every day | ORAL | Status: DC
  Administered 2015-11-20 – 2015-11-21 (×2): 80 mg via ORAL
  Filled 2015-11-20: qty 1

## 2015-11-20 NOTE — H&P (Signed)
[facility] History and Physical  Leah Small WUJ:[id number] DOB: 1941-04-10 DOA: 11/20/2015  Referring physician: ED physician PCP: Marguarite Arbour, MD  Specialists: Dr. Gwen Pounds   Chief Complaint:  Chest pain   HPI: Leah Small is a 75 y.o. female with PMH of insulin-dependent diabetes mellitus, hypertension, hyperlipidemia, seizure disorder, and CAD status post CABG in 1997 and subsequent coronary angiography demonstrating diffuse recurrent disease who now presents to the ED with severe substernal chest pain at rest. Patient was admitted to this institution approximately 4 weeks ago with similar symptoms, had EKG change and cardiac biomarker elevation, and was treated with medical therapy. She initially did okay back home, with occasional chest pain responsive to sublingual nitroglycerin, but this evening while watching TV, she developed an acute substernal chest pain that was more severe than anything she had experienced recently. She described the pain as intense pressure, "12/10" in severity, radiating to the bilateral jaw and back, and only partially relieved with nitroglycerin. Patient experienced minimal relief with the first nitroglycerin and repeated the dose to more times. This brought the pain down to a 4/10. She endorses nausea and belching associated with this episode, but denies diaphoresis or shortness of breath. She was evaluated by cardiology during the recent admission and deemed to not be a candidate for coronary intervention given her diffuse disease. Imdur was increased at that time. Patient's sons are at the bedside and described their mother as quite stoic, and therefore were alarmed when she asked them to call 911. She received days 324 mg aspirin chew en route to the hospital.  In ED, patient was found to be afebrile, saturating well on room air, but with blood pressure in the 200/90 range. EKG was obtained and demonstrates a sinus rhythm with anterolateral  ST-depressions. Chest x-ray was negative for acute cardiopulmonary disease. BMP and CBC are essentially normal and initial troponin is 0.03. Patient was given a dose of Pepcid PO and IV Protonix in the emergency department with no appreciable change in her symptoms. 2 mg of IV morphine were administered with some relief of pain. She remained hypertensive and the hospitalists were asked to admit for ongoing evaluation and management of unstable angina.  Where does patient live?   At home     Can patient participate in ADLs?  Yes        Review of Systems:   General: no fevers, chills, sweats, weight change, poor appetite, or fatigue HEENT: no blurry vision, hearing changes or sore throat Pulm: no dyspnea, cough, or wheeze CV: no palpitations.  Chest pain  Abd: no vomiting, abdominal pain, diarrhea, or constipation. Nausea, belching.  GU: no dysuria, hematuria, increased urinary frequency, or urgency  Ext: no leg edema Neuro: no focal weakness, numbness, or tingling, no vision change or hearing loss Skin: no rash, no wounds MSK: No muscle spasm, no deformity, no red, hot, or swollen joint Heme: No easy bruising or bleeding Travel history: No recent long distant travel    Allergy:  Allergies  Allergen Reactions  . Vicodin [Hydrocodone-Acetaminophen] Nausea And Vomiting  . Talwin [Pentazocine]   . Valium [Diazepam] Other (See Comments)    Makes her feel like she's flying    Past Medical History  Diagnosis Date  . Coronary artery disease   . Acid reflux   . Hypertension   . Hypercholesterolemia   . Chronic stable angina (HCC)   . Type II diabetes mellitus (HCC)   . Seizures (HCC)     "complex  partial; 1st one was 07/06/2015; might have had one today" (10/22/2015)  . TIA (transient ischemic attack)     "several over the last 20 years" (10/22/2015)  . Arthritis     "all over"  . Psoriatic arthritis (HCC)   . Myocardial infarction (HCC) 1991; 07/06/2015  . NSTEMI (non-ST elevated  myocardial infarction) (HCC) 10/22/2015    Past Surgical History  Procedure Laterality Date  . Tonsillectomy  ~ 1947    2nd grade  . Appendectomy  1950s    elementary school  . Cholecystectomy open  ~ 2014  . Abdominal hysterectomy  1973  . Dilation and curettage of uterus  X 1  . 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 Cardiac catheterization  X 2-3  . Carotid stent insertion Bilateral 2011-2012    right-left  . Cataract extraction w/ intraocular lens  implant, bilateral Bilateral 2009-2010  . Coronary angioplasty    . Thrombectomy femoral artery Right ~ 2015    right femoral artery occlusion/notes 12/13/2014    Social History:  reports that she has never smoked. She has never used smokeless tobacco. She reports that she does not drink alcohol or use illicit drugs.  Family History:  Family History  Problem Relation Age of Onset  . Heart attack Father   . Diabetes Mother   . Cancer Mother   . Stroke Mother      Prior to Admission medications   Medication Sig Start Date End Date Taking? Authorizing Provider  aspirin EC 81 MG tablet Take 81 mg by mouth at bedtime.   Yes Historical Provider, MD  atorvastatin (LIPITOR) 40 MG tablet Take 1 tablet (40 mg total) by mouth at bedtime. Patient taking differently: Take 20 mg by mouth at bedtime.  07/10/15  Yes Richarda OverlieNayana Abrol, MD  bimatoprost (LUMIGAN) 0.01 % SOLN Place 1 drop into both eyes at bedtime.   Yes Historical Provider, MD  brimonidine (ALPHAGAN) 0.2 % ophthalmic solution Place 1 drop into both eyes 2 (two) times daily.    Yes Historical Provider, MD  CALCIUM PO Take 1 tablet by mouth daily.   Yes Historical Provider, MD  clopidogrel (PLAVIX) 75 MG tablet Take 75 mg by mouth daily.   Yes Historical Provider, MD  glipiZIDE (GLUCOTROL XL) 10 MG 24 hr tablet Take 10 mg by mouth every morning. 04/26/15  Yes Historical Provider, MD  Insulin Glargine (LANTUS SOLOSTAR) 100 UNIT/ML Solostar Pen Inject 24 Units into the skin at bedtime.    Yes Historical Provider, MD  insulin lispro (HUMALOG) 100 UNIT/ML injection Inject 6-8 Units into the skin 3 (three) times daily before meals. Per sliding scale: 6 units plus:   CBG 150-200 add 1 unit, 201-250 add 2 units, 251-300 add 3 units, 301-350 add 4 units 351-400 add 5 units, >400 add 6 units and call MD   Yes Historical Provider, MD  isosorbide mononitrate (IMDUR) 60 MG 24 hr tablet Take 1 tablet (60 mg total) by mouth daily. 10/24/15  Yes Marinda ElkAbraham Feliz Ortiz, MD  levETIRAcetam (KEPPRA) 750 MG tablet Take 750 mg by mouth 2 (two) times daily.   Yes Historical Provider, MD  MAGNESIUM PO Take 1 tablet by mouth daily.   Yes Historical Provider, MD  metFORMIN (GLUCOPHAGE) 1000 MG tablet Take 1,000 mg by mouth 2 (two) times daily. 04/25/15  Yes Historical Provider, MD  metoprolol tartrate (LOPRESSOR) 25 MG tablet Take 25 mg by mouth at bedtime.   Yes Historical Provider, MD  Multiple Vitamins-Minerals (ZINC PO) Take  1 tablet by mouth daily.   Yes Historical Provider, MD  nitroGLYCERIN (NITROSTAT) 0.4 MG SL tablet Place 0.4 mg under the tongue every 5 (five) minutes as needed for chest pain.   Yes Historical Provider, MD  nystatin cream (MYCOSTATIN) Apply 1 application topically 2 (two) times daily as needed (yeast infection).  04/25/15  Yes Historical Provider, MD  pantoprazole (PROTONIX) 40 MG tablet Take 1 tablet (40 mg total) by mouth daily. 07/10/15  Yes Richarda Overlie, MD  timolol (BETIMOL) 0.5 % ophthalmic solution Place 1 drop into both eyes 2 (two) times daily.   Yes Historical Provider, MD  triamcinolone ointment (KENALOG) 0.1 % Apply 1 application topically 3 (three) times daily as needed (itching from psoriasis).   Yes Historical Provider, MD  vitamin B-12 (CYANOCOBALAMIN) 1000 MCG tablet Take 1,000 mcg by mouth daily.   Yes Historical Provider, MD  vitamin E 400 UNIT capsule Take 400 Units by mouth daily.   Yes Historical Provider, MD    Physical Exam: Filed Vitals:   11/20/15 1948 11/20/15  2015 11/20/15 2100 11/20/15 2115  BP: 197/88 136/86 144/60 191/70  Pulse: 80 74 72 74  Temp: 98.1 F (36.7 C)     TempSrc: Oral     Resp: Height:  (1.6 m)     Weight: 67.586 kg (149 lb)     SpO2: 98% 99% 95% 99%   General: Not in acute distress HEENT:       Eyes: PERRL, EOMI, no scleral icterus or conjunctival pallor.       ENT: No discharge from the ears or nose, no pharyngeal ulcers, oral mucosa moist.        Neck: No JVD, no bruit, no appreciable mass Heme: No cervical adenopathy, no pallor Cardiac: S1/S2, RRR, soft systolic murmur throughout precordium, No gallops or rubs. Pulm: Good air movement bilaterally. No rales, wheezing, rhonchi or rubs. Abd: Soft, nondistended, nontender, no rebound pain or gaurding, BS present. Ext: No LE edema bilaterally. 2+DP/PT pulse bilaterally. Musculoskeletal: No gross deformity, no red, hot, swollen joints   Skin: No rashes or wounds on exposed surfaces  Neuro: Alert, oriented X3, cranial nerves II-XII grossly intact. No focal findings Psych: Patient is not overtly psychotic, appropriate mood and affect.  Labs on Admission:  Basic Metabolic Panel:  Recent Labs Lab 11/20/15 1950  NA 136  K 4.4  CL 101  CO2 22  GLUCOSE 254*  BUN 12  CREATININE 0.74  CALCIUM 9.8   Liver Function Tests: No results for input(s): AST, ALT, ALKPHOS, BILITOT, PROT, ALBUMIN in the last 168 hours. No results for input(s): LIPASE, AMYLASE in the last 168 hours. No results for input(s): AMMONIA in the last 168 hours. CBC:  Recent Labs Lab 11/20/15 1950  WBC 6.2  HGB 12.3  HCT 38.5  MCV 88.9  PLT 184   Cardiac Enzymes: No results for input(s): CKTOTAL, CKMB, CKMBINDEX, TROPONINI in the last 168 hours.  BNP (last 3 results) No results for input(s): BNP in the last 8760 hours.  ProBNP (last 3 results) No results for input(s): PROBNP in the last 8760 hours.  CBG: No results for input(s): GLUCAP in the last 168  hours.  Radiological Exams on Admission: Dg Chest 2 View  11/20/2015  CLINICAL DATA:  Chest pain for 1 day EXAM: CHEST  2 VIEW COMPARISON:  July 18, 2015 FINDINGS: There is no edema or consolidation. Heart remains upper normal in size with pulmonary vascular within normal  limits. Patient is status post coronary artery bypass grafting. No evidence of adenopathy. No bone lesions. No pneumothorax apparent. IMPRESSION: No edema or consolidation.  Stable cardiac silhouette. Electronically Signed   By: Bretta Bang III M.D.   On: 11/20/2015 20:47    EKG: Independently reviewed.  Abnormal findings:  Sinus rhythm, ST-depressions in anterolateral leads, not significantly changed from prior    Assessment/Plan  1. NSTEMI  - Pt presents with sxs concerning for unstable angina  - Pain improved, but persisted after SL NTG x3; placed on nitro infusion  - Troponin turned positive on second collection and anticoagulation initiated with UFH  - She was markedly hypertensive on arrival and received Lop[place]on 5 mg IV x3  - ASA 324 mg was given en route to ED  - Lipitor 80 mg given shortly after arrival  - She will be continued on Plavix  - Monitor in stepdown unit, trend troponin    2. CAD - s/p CABG in 1997 and subsequent cath has demonstrated diffuse disease not amenable to PCI or additional revascularization  - She has been following with cardiology for optimization of her medical management  - Treating acute MI as above  - Continue Lipitor, Lopressor, Imdur, Plavix  - Control risk-factors as below    3. Insulin-dependent type II DM - Uses Lantus 24 units qHS, Humalog sliding-scale, metformin, and glipizide at home  - A1c was 7.7% on 07/06/15, reflecting sub-optimal control at that time  - Hold home medications while admitted  - Check CBG with meals and qHS  - Start with Lantus 20 units qHS and moderate-intensity sliding-scale correctional, adjust prn  - Carb-modified diet when appropriate   - Update A1c, pending   4. Hypertension - Markedly elevated on arrival, improved with Lopressor 5 mg IV x3  - Continue PO Lopressor BID, titrate nitro infusion, hydralazine IVPs prn    5. Hyperlipidemia  - Lipitor 80 mg given x1 in setting of ACS  - Continue her usual dose Lipitor qHS    6. Seizure disorder  - Continue Keppra, which was increased during recent admission for a breakthrough seizure  - Monitor    DVT ppx: SQ Heparin     Code Status: DNR Family Communication:  Yes, patient's 2 sons at bed side Disposition Plan: Admit to inpatient   Date of Service 11/20/2015    Briscoe Deutscher, MD [facility] Pager [phone]  If 7PM-7AM, please contact night-coverage [email or web address] [facility] 11/20/2015, 10:08 PM

## 2015-11-20 NOTE — ED Notes (Signed)
Unable to complete EKG in timely manner d/t no available EKG wires in the department

## 2015-11-20 NOTE — ED Notes (Signed)
Pt had a "complex, partial seizure" per son. Reports hx of same. Prior to episode, pt was alert and oriented, talking about medications, then pt stopped talking, staring off to R side. Episode lasted about a minute. Pt was not postical, A&O x 4, equal grip strengths. Pt states "it's been a while since I've had one of those." Pt is on Keppra for this, reports taking medication tonight prior to arrival

## 2015-11-20 NOTE — ED Notes (Signed)
Pt presents to ER from home with  EMS with substernal CP that began at 430 this evening when watching TV; pt reports pain radiates to jaw and back; pt also reports that she has experienced similar symptoms last November when she was diagnosed with an MI; pt states she was seen here 2-3 wks ago for elevated troponins; EMS reports high BP on arrival, 3 SL NTG and 324mg  ASA given on scene; BP now 170/60; pt states she had an appointment today at 2pm for eye appt where she was diagnosed with a hemorrhage behind her retina

## 2015-11-20 NOTE — ED Provider Notes (Addendum)
CSN: [id number]     Arrival date & time 11/20/15  1942 History   First MD Initiated Contact with Patient 11/20/15 1959     Chief Complaint  Patient presents with  . Chest Pain  . Nausea     (Consider location/radiation/quality/duration/timing/severity/associated sxs/prior Treatment) Patient is a 75 y.o. female presenting with chest pain. The history is provided by the patient and the EMS personnel.  Chest Pain Associated symptoms: no abdominal pain, no back pain, no cough, no fever, no headache, no shortness of breath and not vomiting   Patient w hx cad, cabg 1997, and caths since, presents w pain mid chest that radiates to bil jaw and ears.  Onset at 4 pm. States ate fish at lunch, and then was burping a lot, and wondered whether was related. States also has similar frequent chest pain related to her heart disease. Took a ntg, and states pain improved for about 5 minutes, then recurred, and took another ntg, and pain has persisted, although better now than it was.  +associated nausea. No diaphoresis or sob.   No pleuritic pain.        Past Medical History  Diagnosis Date  . Coronary artery disease   . Acid reflux   . Hypertension   . Hypercholesterolemia   . Chronic stable angina (HCC)   . Type II diabetes mellitus (HCC)   . 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 TIA (transient ischemic attack)     "several over the last 20 years" (10/22/2015)  . Arthritis     "all over"  . Psoriatic arthritis (HCC)   . Myocardial infarction (HCC) 1991; 07/06/2015  . NSTEMI (non-ST elevated myocardial infarction) (HCC) 10/22/2015   Past Surgical History  Procedure Laterality Date  . Tonsillectomy  ~ 1947    2nd grade  . Appendectomy  1950s    elementary school  . Cholecystectomy open  ~ 2014  . Abdominal hysterectomy  1973  . Dilation and curettage of uterus  X 1  . 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 Cardiac  catheterization  X 2-3  . Carotid stent insertion Bilateral 2011-2012    right-left  . Cataract extraction w/ intraocular lens  implant, bilateral Bilateral 2009-2010  . Coronary angioplasty    . Thrombectomy femoral artery Right ~ 2015    right femoral artery occlusion/notes 12/13/2014   Family History  Problem Relation Age of Onset  . Heart attack Father   . Diabetes Mother   . Cancer Mother   . Stroke Mother    Social History  Substance Use Topics  . Smoking status: Never Smoker   . Smokeless tobacco: Never Used  . Alcohol Use: No   OB History    No data available     Review of Systems  Constitutional: Negative for fever and chills.  HENT: Negative for sore throat.   Eyes: Negative for redness.  Respiratory: Negative for cough and shortness of breath.   Cardiovascular: Positive for chest pain. Negative for leg swelling.  Gastrointestinal: Negative for vomiting and abdominal pain.  Genitourinary: Negative for flank pain.  Musculoskeletal: Negative for back pain and neck pain.  Skin: Negative for rash.  Neurological: Negative for headaches.  Hematological: Does not bruise/bleed easily.  Psychiatric/Behavioral: Negative for confusion.      Allergies  Vicodin; Talwin; and Valium  Home Medications   Prior to Admission medications   Medication Sig Start  Date End Date Taking? Authorizing Provider  aspirin EC 81 MG tablet Take 81 mg by mouth at bedtime.    Historical Provider, MD  atorvastatin (LIPITOR) 40 MG tablet Take 1 tablet (40 mg total) by mouth at bedtime. Patient taking differently: Take 20 mg by mouth at bedtime.  07/10/15   Richarda Overlie, MD  bimatoprost (LUMIGAN) 0.01 % SOLN Place 1 drop into both eyes at bedtime.    Historical Provider, MD  brimonidine (ALPHAGAN) 0.2 % ophthalmic solution Place 1 drop into both eyes 2 (two) times daily.     Historical Provider, MD  CALCIUM PO Take 1 tablet by mouth daily.    Historical Provider, MD  clopidogrel (PLAVIX) 75  MG tablet Take 75 mg by mouth daily.    Historical Provider, MD  glipiZIDE (GLUCOTROL XL) 10 MG 24 hr tablet Take 10 mg by mouth every morning. 04/26/15   Historical Provider, MD  Insulin Glargine (LANTUS SOLOSTAR) 100 UNIT/ML Solostar Pen Inject 24 Units into the skin at bedtime.    Historical Provider, MD  Insulin Lispro Prot & Lispro (HUMALOG MIX 75/25 KWIKPEN) (75-25) 100 UNIT/ML Kwikpen Inject 6-12 Units into the skin 4 (four) times daily - after meals and at bedtime. Per sliding scale: 6 units plus:   CBG 150-200 add 1 unit, 201-250 add 2 units, 251-300 add 3 units, 301-350 add 4 units 351-400 add 5 units, >400 add 6 units and call MD    Historical Provider, MD  isosorbide mononitrate (IMDUR) 60 MG 24 hr tablet Take 1 tablet (60 mg total) by mouth daily. 10/24/15   Marinda Elk, MD  levETIRAcetam (KEPPRA) 1000 MG tablet Take 1 tablet (1,000 mg total) by mouth 2 (two) times daily. 10/24/15   Marinda Elk, MD  MAGNESIUM PO Take 1 tablet by mouth daily.    Historical Provider, MD  metFORMIN (GLUCOPHAGE) 1000 MG tablet Take 1,000 mg by mouth 2 (two) times daily. 04/25/15   Historical Provider, MD  metoprolol tartrate (LOPRESSOR) 25 MG tablet Take 25 mg by mouth at bedtime.    Historical Provider, MD  Multiple Vitamins-Minerals (ZINC PO) Take 1 tablet by mouth daily.    Historical Provider, MD  nitroGLYCERIN (NITROSTAT) 0.4 MG SL tablet Place 0.4 mg under the tongue every 5 (five) minutes as needed for chest pain.    Historical Provider, MD  nystatin cream (MYCOSTATIN) Apply 1 application topically 2 (two) times daily as needed (yeast infection).  04/25/15   Historical Provider, MD  pantoprazole (PROTONIX) 40 MG tablet Take 1 tablet (40 mg total) by mouth daily. 07/10/15   Richarda Overlie, MD  timolol (BETIMOL) 0.5 % ophthalmic solution Place 1 drop into both eyes 2 (two) times daily.    Historical Provider, MD  triamcinolone ointment (KENALOG) 0.1 % Apply 1 application topically 3 (three) times  daily as needed (itching from psoriasis).    Historical Provider, MD  vitamin B-12 (CYANOCOBALAMIN) 1000 MCG tablet Take 1,000 mcg by mouth daily.    Historical Provider, MD  vitamin E 400 UNIT capsule Take 400 Units by mouth daily.    Historical Provider, MD   BP 197/88 mmHg  Pulse 80  Temp(Src) 98.1 F (36.7 C) (Oral)  Resp 18  Ht  (1.6 m)  Wt 67.586 kg  BMI 26.40 kg/m2  SpO2 98% Physical Exam  Constitutional: She appears well-developed and well-nourished. No distress.  HENT:  Mouth/Throat: Oropharynx is clear and moist.  Eyes: Conjunctivae are normal. No scleral icterus.  Neck: Neck  supple. No tracheal deviation present.  Cardiovascular: Normal rate, regular rhythm, normal heart sounds and intact distal pulses.  Exam reveals no gallop and no friction rub.   No murmur heard. Pulmonary/Chest: Effort normal and breath sounds normal. No respiratory distress. She exhibits no tenderness.  Abdominal: Soft. Normal appearance. She exhibits no distension. There is no tenderness.  Musculoskeletal: She exhibits no edema or tenderness.  Neurological: She is alert.  Skin: Skin is warm and dry. No rash noted. She is not diaphoretic.  Psychiatric: She has a normal mood and affect.  Nursing note and vitals reviewed.   ED Course  Procedures (including critical care time) Labs Review   Results for orders placed or performed during the hospital encounter of 11/20/15  Basic metabolic panel  Result Value Ref Range   Sodium 136 135 - 145 mmol/L   Potassium 4.4 3.5 - 5.1 mmol/L   Chloride 101 101 - 111 mmol/L   CO2 22 22 - 32 mmol/L   Glucose, Bld 254 (H) 65 - 99 mg/dL   BUN 12 6 - 20 mg/dL   Creatinine, Ser 1.61 0.44 - 1.00 mg/dL   Calcium 9.8 8.9 - 09.6 mg/dL   GFR calc non Af Amer >60 >60 mL/min   GFR calc Af Amer >60 >60 mL/min   Anion gap 13 5 - 15  CBC  Result Value Ref Range   WBC 6.2 4.0 - 10.5 K/uL   RBC 4.33 3.87 - 5.11 MIL/uL   Hemoglobin 12.3 12.0 - 15.0 g/dL   HCT  04.5 40.9 - 81.1 %   MCV 88.9 78.0 - 100.0 fL   MCH 28.4 26.0 - 34.0 pg   MCHC 31.9 30.0 - 36.0 g/dL   RDW 91.4 78.2 - 95.6 %   Platelets 184 150 - 400 K/uL  I-stat troponin, ED (not at [facility], [facility])  Result Value Ref Range   Troponin i, poc 0.03 0.00 - 0.08 ng/mL   Comment 3           Dg Chest 2 View  11/20/2015  CLINICAL DATA:  Chest pain for 1 day EXAM: CHEST  2 VIEW COMPARISON:  July 18, 2015 FINDINGS: There is no edema or consolidation. Heart remains upper normal in size with pulmonary vascular within normal limits. Patient is status post coronary artery bypass grafting. No evidence of adenopathy. No bone lesions. No pneumothorax apparent. IMPRESSION: No edema or consolidation.  Stable cardiac silhouette. Electronically Signed   By: Bretta Bang III M.D.   On: 11/20/2015 20:47      I have personally reviewed and evaluated these images and lab results as part of my medical decision-making.   EKG Interpretation   Date/Time:  Wednesday November 20 2015 20:15:55 EDT Ventricular Rate:  81 PR Interval:  130 QRS Duration: 118 QT Interval:  397 QTC Calculation: 461 R Axis:   75 Text Interpretation:  Sinus rhythm Nonspecific intraventricular conduction  delay Non-specific ST-t changes No significant change since last tracing  Confirmed by Denton Lank  MD, Caryn Bee ([id number]) on 11/20/2015 8:25:57 PM      MDM   Iv ns. Continuous pulse ox and monitor. Ecg. Labs. Cxr.  Reviewed nursing notes and prior charts for additional history.   Pt indicates pain persists. Morphine iv. Protonix/gi meds.  Pt indicates her pain today feels like her prior cardiac chest pain. Given persistent/recurrent chest pain, will admit to med service for obs.  On review cardiologist notes from most recent admission - indicates patient is not candidate  for further cath/revascul or cabg, and that medical tx was recommended.   Discussed pt with Dr Antionette Char, requests temp orders, stepdown bed.       Cathren Laine,  MD 11/20/15 2123

## 2015-11-21 ENCOUNTER — Encounter (HOSPITAL_COMMUNITY)
Admission: EM | Disposition: A | Discharge status: Home / Self Care | Payer: Self-pay | Source: Home / Self Care | Attending: Internal Medicine

## 2015-11-21 ENCOUNTER — Other Ambulatory Visit: Payer: Self-pay

## 2015-11-21 ENCOUNTER — Encounter (HOSPITAL_COMMUNITY): Payer: Self-pay | Admitting: General Practice

## 2015-11-21 DIAGNOSIS — I2510 Atherosclerotic heart disease of native coronary artery without angina pectoris: Secondary | ICD-10-CM

## 2015-11-21 DIAGNOSIS — I200 Unstable angina: Secondary | ICD-10-CM

## 2015-11-21 DIAGNOSIS — E118 Type 2 diabetes mellitus with unspecified complications: Secondary | ICD-10-CM

## 2015-11-21 DIAGNOSIS — G40209 Localization-related (focal) (partial) symptomatic epilepsy and epileptic syndromes with complex partial seizures, not intractable, without status epilepticus: Secondary | ICD-10-CM

## 2015-11-21 DIAGNOSIS — E785 Hyperlipidemia, unspecified: Secondary | ICD-10-CM

## 2015-11-21 DIAGNOSIS — I10 Essential (primary) hypertension: Secondary | ICD-10-CM

## 2015-11-21 DIAGNOSIS — I25110 Atherosclerotic heart disease of native coronary artery with unstable angina pectoris: Secondary | ICD-10-CM

## 2015-11-21 HISTORY — PX: CARDIAC CATHETERIZATION: SHX172

## 2015-11-21 HISTORY — PX: CORONARY ANGIOPLASTY WITH STENT PLACEMENT: SHX49

## 2015-11-21 LAB — CBC
HCT: 34.3 % — ABNORMAL LOW (ref 36.0–46.0)
HCT: 33.5 % — ABNORMAL LOW (ref 36.0–46.0)
Hemoglobin: 10.6 g/dL — ABNORMAL LOW (ref 12.0–15.0)
Hemoglobin: 10.5 g/dL — ABNORMAL LOW (ref 12.0–15.0)
MCH: 27.7 pg (ref 26.0–34.0)
MCH: 28.1 pg (ref 26.0–34.0)
MCHC: 30.9 g/dL (ref 30.0–36.0)
MCHC: 31.3 g/dL (ref 30.0–36.0)
MCV: 89.6 fL (ref 78.0–100.0)
MCV: 89.6 fL (ref 78.0–100.0)
Platelets: 188 K/uL (ref 150–400)
Platelets: 165 K/uL (ref 150–400)
RBC: 3.83 MIL/uL — ABNORMAL LOW (ref 3.87–5.11)
RBC: 3.74 MIL/uL — ABNORMAL LOW (ref 3.87–5.11)
RDW: 14.6 % (ref 11.5–15.5)
RDW: 14.7 % (ref 11.5–15.5)
WBC: 5.9 K/uL (ref 4.0–10.5)
WBC: 5.4 K/uL (ref 4.0–10.5)

## 2015-11-21 LAB — GLUCOSE, CAPILLARY
GLUCOSE-CAPILLARY: 126 mg/dL — AB (ref 65–99)
GLUCOSE-CAPILLARY: 119 mg/dL — AB (ref 65–99)
GLUCOSE-CAPILLARY: 140 mg/dL — AB (ref 65–99)
Glucose-Capillary: 108 mg/dL — ABNORMAL HIGH (ref 65–99)

## 2015-11-21 LAB — BASIC METABOLIC PANEL
ANION GAP: 13 (ref 5–15)
BUN: 13 mg/dL (ref 6–20)
CALCIUM: 9.2 mg/dL (ref 8.9–10.3)
CO2: 22 mmol/L (ref 22–32)
CREATININE: 0.85 mg/dL (ref 0.44–1.00)
Chloride: 104 mmol/L (ref 101–111)
GLUCOSE: 121 mg/dL — AB (ref 65–99)
Potassium: 4.3 mmol/L (ref 3.5–5.1)
Sodium: 139 mmol/L (ref 135–145)

## 2015-11-21 LAB — PROTIME-INR
INR: 1.19 (ref 0.00–1.49)
PROTHROMBIN TIME: 15.3 s — AB (ref 11.6–15.2)

## 2015-11-21 LAB — TROPONIN I
TROPONIN I: 1.01 ng/mL — AB (ref <0.031)
Troponin I: 0.75 ng/mL (ref <0.031)

## 2015-11-21 LAB — POCT ACTIVATED CLOTTING TIME: ACTIVATED CLOTTING TIME: 497 s

## 2015-11-21 LAB — MRSA PCR SCREENING: MRSA by PCR: NEGATIVE

## 2015-11-21 LAB — HEPARIN LEVEL (UNFRACTIONATED): Heparin Unfractionated: 0.62 IU/mL (ref 0.30–0.70)

## 2015-11-21 SURGERY — LEFT HEART CATH AND CORONARY ANGIOGRAPHY

## 2015-11-21 MED ORDER — BIVALIRUDIN 250 MG IV SOLR
250.0000 mg | INTRAVENOUS | Status: DC | PRN
  Administered 2015-11-21: 1.75 mg/kg/h via INTRAVENOUS

## 2015-11-21 MED ORDER — SODIUM CHLORIDE 0.9 % IV SOLN
INTRAVENOUS | Status: DC
  Administered 2015-11-21: 10:00:00 via INTRAVENOUS

## 2015-11-21 MED ORDER — SODIUM CHLORIDE 0.9% FLUSH: 3.0000 mL | INTRAVENOUS | Status: DC | PRN

## 2015-11-21 MED ORDER — SODIUM CHLORIDE 0.9 % IV SOLN: 250.0000 mL | INTRAVENOUS | Status: DC | PRN

## 2015-11-21 MED ORDER — HEART ATTACK BOUNCING BOOK
Freq: Once | Status: AC
  Administered 2015-11-21: 21:00:00
  Filled 2015-11-21: qty 1

## 2015-11-21 MED ORDER — FENTANYL CITRATE (PF) 100 MCG/2ML IJ SOLN
INTRAMUSCULAR | Status: AC
Start: 2015-11-21 — End: 2015-11-21
  Filled 2015-11-21: qty 2

## 2015-11-21 MED ORDER — BIVALIRUDIN 250 MG IV SOLR
INTRAVENOUS | Status: AC
  Filled 2015-11-21: qty 250

## 2015-11-21 MED ORDER — HEPARIN (PORCINE) IN NACL 100-0.45 UNIT/ML-% IJ SOLN
1000.0000 [IU]/h | INTRAMUSCULAR | Status: DC
  Administered 2015-11-21: 1000 [IU]/h via INTRAVENOUS
  Filled 2015-11-21: qty 250

## 2015-11-21 MED ORDER — LIDOCAINE HCL (PF) 1 % IJ SOLN
INTRAMUSCULAR | Status: AC
  Filled 2015-11-21: qty 30

## 2015-11-21 MED ORDER — SODIUM CHLORIDE 0.9% FLUSH
3.0000 mL | Freq: Two times a day (BID) | INTRAVENOUS | Status: DC
  Administered 2015-11-22: 3 mL via INTRAVENOUS

## 2015-11-21 MED ORDER — HEPARIN (PORCINE) IN NACL 2-0.9 UNIT/ML-% IJ SOLN
INTRAMUSCULAR | Status: AC
  Filled 2015-11-21: qty 1000

## 2015-11-21 MED ORDER — SODIUM CHLORIDE 0.9% FLUSH
3.0000 mL | Freq: Two times a day (BID) | INTRAVENOUS | Status: DC
  Administered 2015-11-21: 3 mL via INTRAVENOUS

## 2015-11-21 MED ORDER — NITROGLYCERIN 1 MG/10 ML FOR IR/CATH LAB
INTRA_ARTERIAL | Status: AC
  Filled 2015-11-21: qty 10

## 2015-11-21 MED ORDER — BIVALIRUDIN BOLUS VIA INFUSION - CUPID
INTRAVENOUS | Status: DC | PRN
  Administered 2015-11-21: 49.35 mg via INTRAVENOUS

## 2015-11-21 MED ORDER — LIDOCAINE HCL (PF) 1 % IJ SOLN
INTRAMUSCULAR | Status: DC | PRN
  Administered 2015-11-21: 14 mL

## 2015-11-21 MED ORDER — ACETAMINOPHEN 325 MG PO TABS: 650.0000 mg | ORAL_TABLET | ORAL | Status: DC | PRN

## 2015-11-21 MED ORDER — ASPIRIN 81 MG PO CHEW: 81.0000 mg | CHEWABLE_TABLET | ORAL | Status: DC

## 2015-11-21 MED ORDER — ANGIOPLASTY BOOK
Freq: Once | Status: AC
  Administered 2015-11-21: 21:00:00
  Filled 2015-11-21: qty 1

## 2015-11-21 MED ORDER — TIMOLOL MALEATE 0.5 % OP SOLN
1.0000 [drp] | Freq: Two times a day (BID) | OPHTHALMIC | Status: DC
  Administered 2015-11-21 – 2015-11-22 (×2): 1 [drp] via OPHTHALMIC
  Filled 2015-11-21: qty 5

## 2015-11-21 MED ORDER — FENTANYL CITRATE (PF) 100 MCG/2ML IJ SOLN
INTRAMUSCULAR | Status: DC | PRN
  Administered 2015-11-21: 25 ug via INTRAVENOUS

## 2015-11-21 MED ORDER — SODIUM CHLORIDE 0.9 % WEIGHT BASED INFUSION
3.0000 mL/kg/h | INTRAVENOUS | Status: AC
  Administered 2015-11-21: 3 mL/kg/h via INTRAVENOUS

## 2015-11-21 MED ORDER — HYDRALAZINE HCL 20 MG/ML IJ SOLN
INTRAMUSCULAR | Status: AC
Start: 2015-11-21 — End: 2015-11-21
  Filled 2015-11-21: qty 1

## 2015-11-21 MED ORDER — NITROGLYCERIN 0.4 MG SL SUBL
0.4000 mg | SUBLINGUAL_TABLET | SUBLINGUAL | Status: DC | PRN
  Administered 2015-11-21 (×3): 0.4 mg via SUBLINGUAL

## 2015-11-21 MED ORDER — NITROGLYCERIN 0.4 MG SL SUBL
SUBLINGUAL_TABLET | SUBLINGUAL | Status: AC
  Filled 2015-11-21: qty 1

## 2015-11-21 MED ORDER — ATROPINE SULFATE 0.1 MG/ML IJ SOLN
INTRAMUSCULAR | Status: AC
  Filled 2015-11-21: qty 10

## 2015-11-21 MED ORDER — HYDRALAZINE HCL 20 MG/ML IJ SOLN
INTRAMUSCULAR | Status: DC | PRN
  Administered 2015-11-21: 10 mg via INTRAVENOUS

## 2015-11-21 MED ORDER — CLOPIDOGREL BISULFATE 75 MG PO TABS: 75.0000 mg | ORAL_TABLET | Freq: Every day | ORAL | Status: DC

## 2015-11-21 MED ORDER — ONDANSETRON HCL 4 MG/2ML IJ SOLN: 4.0000 mg | Freq: Four times a day (QID) | INTRAMUSCULAR | Status: DC | PRN

## 2015-11-21 MED ORDER — ISOSORBIDE MONONITRATE ER 60 MG PO TB24
60.0000 mg | ORAL_TABLET | Freq: Every day | ORAL | Status: DC
  Administered 2015-11-21: 60 mg via ORAL
  Filled 2015-11-21: qty 1

## 2015-11-21 MED ORDER — BRIMONIDINE TARTRATE 0.2 % OP SOLN
1.0000 [drp] | Freq: Two times a day (BID) | OPHTHALMIC | Status: DC
  Administered 2015-11-21 – 2015-11-22 (×2): 1 [drp] via OPHTHALMIC
  Filled 2015-11-21 (×2): qty 5

## 2015-11-21 MED ORDER — NITROGLYCERIN 1 MG/10 ML FOR IR/CATH LAB
INTRA_ARTERIAL | Status: DC | PRN
  Administered 2015-11-21: 200 ug via INTRACORONARY

## 2015-11-21 SURGICAL SUPPLY — 18 items
BALLN ANGIOSCULPT RX 2.0X10 (BALLOONS) ×4
BALLN ~~LOC~~ EUPHORA RX 2.25X8 (BALLOONS) ×4
BALLOON ANGIOSCULPT RX 2.0X10 (BALLOONS) ×2 IMPLANT
BALLOON ~~LOC~~ EUPHORA RX 2.25X8 (BALLOONS) ×2 IMPLANT
CATH INFINITI 5FR MULTPACK ANG (CATHETERS) ×4 IMPLANT
CATH LAUNCHER 6FR AL1 (CATHETERS) ×2 IMPLANT
CATHETER LAUNCHER 6FR AL1 (CATHETERS) ×4
KIT ENCORE 26 ADVANTAGE (KITS) ×8 IMPLANT
KIT HEART LEFT (KITS) ×4 IMPLANT
PACK CARDIAC CATHETERIZATION (CUSTOM PROCEDURE TRAY) ×4 IMPLANT
SHEATH PINNACLE 5F 10CM (SHEATH) ×4 IMPLANT
SHEATH PINNACLE 6F 10CM (SHEATH) ×4 IMPLANT
STENT RESOLUTE INTEG 2.25X14 (Permanent Stent) ×4 IMPLANT
TRANSDUCER W/STOPCOCK (MISCELLANEOUS) ×4 IMPLANT
TUBING CIL FLEX 10 FLL-RA (TUBING) ×4 IMPLANT
VALVE GUARDIAN II ~~LOC~~ HEMO (MISCELLANEOUS) ×4 IMPLANT
WIRE ASAHI PROWATER 180CM (WIRE) ×4 IMPLANT
WIRE EMERALD 3MM-J .035X150CM (WIRE) ×4 IMPLANT

## 2015-11-21 NOTE — Progress Notes (Addendum)
ANTICOAGULATION CONSULT NOTE - Initial Consult  Pharmacy Consult for heparin Indication: chest pain/ACS  Allergies  Allergen Reactions  . Vicodin [Hydrocodone-Acetaminophen] Nausea And Vomiting  . Talwin [Pentazocine]   . Valium [Diazepam] Other (See Comments)    Makes her feel like she's flying    Patient Measurements: Height:  (160 cm) Weight: 146 lb 4.8 oz (66.361 kg) IBW/kg (Calculated) : 52.4 Heparin Dosing Weight: 65kg  Vital Signs: Temp: 97.8 F (36.6 C) (03/29 2347) Temp Source: Oral (03/29 2347) BP: 177/72 mmHg (03/29 2347) Pulse Rate: 74 (03/29 2347)  Labs:  Recent Labs  11/20/15 1950 11/20/15 2236  HGB 12.3  --   HCT 38.5  --   PLT 184  --   CREATININE 0.74  --   TROPONINI  --  0.45*    Estimated Creatinine Clearance: 55.6 mL/min (by C-G formula based on Cr of 0.74).   Medical History: Past Medical History  Diagnosis Date  . Coronary artery disease   . Acid reflux   . Hypertension   . Hypercholesterolemia   . Chronic stable angina (HCC)   . Type II diabetes mellitus (HCC)   . 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 TIA (transient ischemic attack)     "several over the last 20 years" (10/22/2015)  . Arthritis     "all over"  . Psoriatic arthritis (HCC)   . Myocardial infarction (HCC) 1991; 07/06/2015  . NSTEMI (non-ST elevated myocardial infarction) (HCC) 10/22/2015    Medications:  Prescriptions prior to admission  Medication Sig Dispense Refill Last Dose  . aspirin EC 81 MG tablet Take 81 mg by mouth at bedtime.   11/20/2015 at Unknown time  . atorvastatin (LIPITOR) 40 MG tablet Take 1 tablet (40 mg total) by mouth at bedtime. (Patient taking differently: Take 20 mg by mouth at bedtime. ) 30 tablet 1 11/19/2015 at Unknown time  . bimatoprost (LUMIGAN) 0.01 % SOLN Place 1 drop into both eyes at bedtime.   11/19/2015 at Unknown time  . brimonidine (ALPHAGAN) 0.2 % ophthalmic  solution Place 1 drop into both eyes 2 (two) times daily.    11/20/2015 at Unknown time  . CALCIUM PO Take 1 tablet by mouth daily.   11/20/2015 at Unknown time  . clopidogrel (PLAVIX) 75 MG tablet Take 75 mg by mouth daily.   11/20/2015 at Unknown time  . glipiZIDE (GLUCOTROL XL) 10 MG 24 hr tablet Take 10 mg by mouth every morning.   11/20/2015 at Unknown time  . Insulin Glargine (LANTUS SOLOSTAR) 100 UNIT/ML Solostar Pen Inject 24 Units into the skin at bedtime.   11/19/2015 at Unknown time  . insulin lispro (HUMALOG) 100 UNIT/ML injection Inject 6-8 Units into the skin 3 (three) times daily before meals. Per sliding scale: 6 units plus:   CBG 150-200 add 1 unit, 201-250 add 2 units, 251-300 add 3 units, 301-350 add 4 units 351-400 add 5 units, >400 add 6 units and call MD   11/20/2015 at Unknown time  . isosorbide mononitrate (IMDUR) 60 MG 24 hr tablet Take 1 tablet (60 mg total) by mouth daily. 30 tablet 3 11/19/2015 at Unknown time  . levETIRAcetam (KEPPRA) 750 MG tablet Take 750 mg by mouth 2 (two) times daily.   11/20/2015 at Unknown time  . MAGNESIUM PO Take 1 tablet by mouth daily.   11/20/2015 at Unknown time  . metFORMIN (GLUCOPHAGE) 1000 MG tablet Take 1,000  mg by mouth 2 (two) times daily.   11/20/2015 at 430 pm  . metoprolol tartrate (LOPRESSOR) 25 MG tablet Take 25 mg by mouth at bedtime.   11/19/2015 at Unknown time  . Multiple Vitamins-Minerals (ZINC PO) Take 1 tablet by mouth daily.   11/20/2015 at Unknown time  . nitroGLYCERIN (NITROSTAT) 0.4 MG SL tablet Place 0.4 mg under the tongue every 5 (five) minutes as needed for chest pain.   PRN  . nystatin cream (MYCOSTATIN) Apply 1 application topically 2 (two) times daily as needed (yeast infection).    11/19/2015 at Unknown time  . pantoprazole (PROTONIX) 40 MG tablet Take 1 tablet (40 mg total) by mouth daily. 30 tablet 1 11/20/2015 at Unknown time  . timolol (BETIMOL) 0.5 % ophthalmic solution Place 1 drop into both eyes 2 (two) times daily.    11/20/2015 at Unknown time  . triamcinolone ointment (KENALOG) 0.1 % Apply 1 application topically 3 (three) times daily as needed (itching from psoriasis).   11/20/2015 at Unknown time  . vitamin B-12 (CYANOCOBALAMIN) 1000 MCG tablet Take 1,000 mcg by mouth daily.   11/20/2015 at Unknown time  . vitamin E 400 UNIT capsule Take 400 Units by mouth daily.   11/20/2015 at Unknown time   Scheduled:  . aspirin EC  81 mg Oral Daily  . atorvastatin  80 mg Oral q1800  . brimonidine  1 drop Both Eyes BID  . calcium carbonate  1 tablet Oral Daily  . clopidogrel  75 mg Oral Daily  . heparin  5,000 Units Subcutaneous 3 times per day  . insulin aspart  0-15 Units Subcutaneous TID WC  . insulin glargine  20 Units Subcutaneous QHS  . isosorbide mononitrate  60 mg Oral Daily  . latanoprost  1 drop Both Eyes QHS  . levETIRAcetam  750 mg Oral BID  . magnesium oxide  200 mg Oral Daily  . metoprolol tartrate  12.5 mg Oral BID  . pantoprazole  40 mg Oral Daily  . sodium chloride flush  3 mL Intravenous Q12H  . timolol  1 drop Both Eyes BID  . vitamin B-12  1,000 mcg Oral Daily  . vitamin E  400 Units Oral Daily   Infusions:  . nitroGLYCERIN 5 mcg/min (11/20/15 2247)    Assessment: 74yo female c/o CP associated w/ nasuea, initial troponin elevated, to begin heparin.  Goal of Therapy:  Heparin level 0.3-0.7 units/ml Monitor platelets by anticoagulation protocol: Yes   Plan:  Rec'd SQ heparin <1hr ago; will start heparin gtt at 1000 units/hr (based on heparin administration on recent admission) and monitor heparin levels and CBC.  Vernard GamblesVeronda Quency Tober, PharmD, BCPS  11/21/2015,12:02 AM

## 2015-11-21 NOTE — Progress Notes (Signed)
At 1845, patient reported sharp right chest pain, 7/10.  Given 3 SL NTG; pain down to 2/10 at this time.  EKG shows no marked changes.  Handed over to night RN who is pulling right femoral sheath at this time.  Unable to contact any care providers as AMION is down.

## 2015-11-21 NOTE — Progress Notes (Signed)
[facility]                                                                              Patient Demographics  Leah Small, is a 75 y.o. female, DOB - [birth year], MVH:[id number]  Admit date - 11/20/2015   Admitting Physician Briscoe Deutscherimothy S Opyd, MD  Outpatient Primary MD for the patient is SPARKS,JEFFREY D, MD  LOS - 1  days    Chief Complaint  Patient presents with  . Chest Pain  . Nausea       Brief HPI   The patient is a 75 year old female with insulin-dependent diabetes mellitus, hypertension, hyperlipidemia, seizure disorder, and CAD status post CABG in 1997 and subsequent coronary angiography demonstrating diffuse recurrent disease who now presents to the ED with severe substernal chest pain at rest. Patient has recurrent admissions with similar symptoms, previously has been treated with medical therapy. She reported acute sternal substernal chest pain with nausea and belching, radiating to bilateral jaw and back. Second troponin 1.01, patient was placed on IV nitro drip, heparin drip and cardiology was consulted.   Assessment & Plan      Unstable angina with NSTEMI, known history of CAD with CABG in 1997 with recurrent admissions due to this - Second Troponin 1.01, patient was started on IV nitroglycerin infusion and IV heparin drip. - Cardiology consulted, plan for cardiac cath today - 2-D echo 3/1 had shown EF 50-55% - Continue aspirin, Lipitor, Lopressor, Imdur, Plavix   Insulin-dependent type II DM - Uses Lantus 24 units qHS, Humalog sliding-scale, metformin, and glipizide at home  - A1c was 7.7% on 07/06/15, repeat hemoglobin A1c - Hold home medications while admitted  - Start with Lantus 20 units qHS and moderate-intensity sliding-scale correctional, adjust prn     Hypertension urgency, BP 197/88 at the time of admission - Markedly elevated on arrival, improved with Lopressor 5 mg IV x3  - Continue PO Lopressor BID, titrate nitro infusion,  hydralazine IVPs prn    Hyperlipidemia  - Lipitor 80 mg given x1 in setting of ACS,  - Last lipid panel 11/16 had shown LDL 82, repeat  - Continue her usual dose Lipitor qHS   6. Seizure disorder  - Continue Keppra, which was increased during recent admission for a breakthrough seizure   Code Status: DNR  Family Communication: Discussed in detail with the patient, all imaging results, lab results explained to the patient   Disposition Plan:   Time Spent in minutes   25 minutes  Procedures  Cath today  Consults   cardiology  DVT Prophylaxis  heparin drip  Medications  Scheduled Meds: . [START ON 11/22/2015] aspirin  81 mg Oral Pre-Cath  . aspirin EC  81 mg Oral Daily  . atorvastatin  80 mg Oral q1800  . brimonidine  1 drop Both Eyes BID  . calcium carbonate  1 tablet Oral Daily  . clopidogrel  75 mg Oral Daily  . insulin aspart  0-15 Units Subcutaneous TID WC  . insulin glargine  20 Units Subcutaneous QHS  . isosorbide mononitrate  60 mg Oral Daily  . latanoprost  1 drop Both Eyes  QHS  . levETIRAcetam  750 mg Oral BID  . magnesium oxide  200 mg Oral Daily  . metoprolol tartrate  12.5 mg Oral BID  . pantoprazole  40 mg Oral Daily  . sodium chloride flush  3 mL Intravenous Q12H  . sodium chloride flush  3 mL Intravenous Q12H  . timolol  1 drop Both Eyes BID  . vitamin B-12  1,000 mcg Oral Daily  . vitamin E  400 Units Oral Daily   Continuous Infusions: . sodium chloride 100 mL/hr at 11/21/15 1000  . heparin 1,000 Units/hr (11/21/15 0105)  . nitroGLYCERIN 5 mcg/min (11/20/15 2247)   PRN Meds:.sodium chloride, sodium chloride, acetaminophen, metoprolol, ondansetron (ZOFRAN) IV, sodium chloride flush, sodium chloride flush, triamcinolone ointment, zolpidem   Antibiotics   Anti-infectives    None        Subjective:   Tericka Devincenzi was seen and examined today.  No chest pain at the time of my examination. Patient denies dizziness, chest pain,  shortness of breath, abdominal pain, N/V/D/C, new weakness, numbess, tingling. Ambulating in the room  Objective:   Filed Vitals:   11/21/15 0417 11/21/15 0548 11/21/15 0806 11/21/15 0904  BP: 116/56  110/49   Pulse: 70   70  Temp: 97.9 F (36.6 C)   98 F (36.7 C)  TempSrc: Oral   Oral  Resp: 20   18  Height:      Weight:  65.817 kg (145 lb 1.6 oz)    SpO2: 98%   98%    Intake/Output Summary (Last 24 hours) at 11/21/15 1152 Last data filed at 11/21/15 1100  Gross per 24 hour  Intake  417.5 ml  Output      0 ml  Net  417.5 ml     Wt Readings from Last 3 Encounters:  11/21/15 65.817 kg (145 lb 1.6 oz)  10/24/15 66.497 kg (146 lb 9.6 oz)  07/09/15 67.1 kg (147 lb 14.9 oz)     Exam  General: Alert and oriented x 3, NAD  HEENT:  PERRLA, EOMI, Anicteric Sclera, mucous membranes moist.   Neck: Supple, no JVD, no masses  CVS: S1 S2 auscultated, no rubs, murmurs or gallops. Regular rate and rhythm.  Respiratory: Clear to auscultation bilaterally, no wheezing, rales or rhonchi  Abdomen: Soft, nontender, nondistended, + bowel sounds  Ext: no cyanosis clubbing or edema  Neuro: AAOx3, Cr N's II- XII. Strength 5/5 upper and lower extremities bilaterally  Skin: No rashes  Psych: Normal affect and demeanor, alert and oriented x3    Data Reviewed:  I have personally reviewed following labs and imaging studies  Micro Results Recent Results (from the past 240 hour(s))  MRSA PCR Screening     Status: None   Collection Time: 11/20/15 11:52 PM  Result Value Ref Range Status   MRSA by PCR NEGATIVE NEGATIVE Final    Comment:        The GeneXpert MRSA Assay (FDA approved for NASAL specimens only), is one component of a comprehensive MRSA colonization surveillance program. It is not intended to diagnose MRSA infection nor to guide or monitor treatment for MRSA infections.     Radiology Reports Dg Chest 2 View  11/20/2015  CLINICAL DATA:  Chest pain for 1 day  EXAM: CHEST  2 VIEW COMPARISON:  July 18, 2015 FINDINGS: There is no edema or consolidation. Heart remains upper normal in size with pulmonary vascular within normal limits. Patient is status post coronary artery bypass grafting. No evidence  of adenopathy. No bone lesions. No pneumothorax apparent. IMPRESSION: No edema or consolidation.  Stable cardiac silhouette. Electronically Signed   By: Bretta Bang III M.D.   On: 11/20/2015 20:47    CBC  Recent Labs Lab 11/20/15 1950 11/21/15 0325 11/21/15 0755  WBC 6.2 5.9 5.4  HGB 12.3 10.6* 10.5*  HCT 38.5 34.3* 33.5*  PLT 184 188 165  MCV 88.9 89.6 89.6  MCH 28.4 27.7 28.1  MCHC 31.9 30.9 31.3  RDW 14.3 14.6 14.7    Chemistries   Recent Labs Lab 11/20/15 1950 11/21/15 0325  NA 136 139  K 4.4 4.3  CL 101 104  CO2 22 22  GLUCOSE 254* 121*  BUN 12 13  CREATININE 0.74 0.85  CALCIUM 9.8 9.2   ------------------------------------------------------------------------------------------------------------------ estimated creatinine clearance is 52.2 mL/min (by C-G formula based on Cr of 0.85). ------------------------------------------------------------------------------------------------------------------ No results for input(s): HGBA1C in the last 72 hours. ------------------------------------------------------------------------------------------------------------------ No results for input(s): CHOL, HDL, LDLCALC, TRIG, CHOLHDL, LDLDIRECT in the last 72 hours. ------------------------------------------------------------------------------------------------------------------ No results for input(s): TSH, T4TOTAL, T3FREE, THYROIDAB in the last 72 hours.  Invalid input(s): FREET3 ------------------------------------------------------------------------------------------------------------------ No results for input(s): VITAMINB12, FOLATE, FERRITIN, TIBC, IRON, RETICCTPCT in the last 72 hours.  Coagulation profile No results for  input(s): INR, PROTIME in the last 168 hours.  No results for input(s): DDIMER in the last 72 hours.  Cardiac Enzymes  Recent Labs Lab 11/20/15 2236 11/21/15 0330  TROPONINI 0.45* 1.01*   ------------------------------------------------------------------------------------------------------------------ Invalid input(s): POCBNP   Recent Labs  11/20/15 2352 11/21/15 0737  GLUCAP 94 126*     Ellamay Fors M.D. [facility] 11/21/2015, 11:52 AM  Pager: [phone] Between 7am to 7pm - call Pager - [phone]  After 7pm go to [email or web address] - password [email or web address]  Call night coverage person covering after 7pm

## 2015-11-21 NOTE — Progress Notes (Signed)
Spoke with Azalee CourseHao Meng and updated on patient status and EKG results.  Nito drip to remain off but restarted if chest pain returns, with notification of night fellow.  Informed nite RN of the above.

## 2015-11-21 NOTE — Care Management Note (Signed)
Case Management Note  Patient Details  Name: Cedric FishmanDonnie R Giannotti MRN: [id number] Date of Birth: 11-19-1940  Subjective/Objective:  Pt admitted for chest pain- positive enzymes Nstemi. Cardiac Cath 11-21-15.               Action/Plan: CM to monitor for disposition needs.    Expected Discharge Date:                  Expected Discharge Plan:  Home/Self Care  In-House Referral:     Discharge planning Services  CM Consult  Post Acute Care Choice:    Choice offered to:     DME Arranged:    DME Agency:     HH Arranged:    HH Agency:     Status of Service:  In process, will continue to follow  Medicare Important Message Given:    Date Medicare IM Given:    Medicare IM give by:    Date Additional Medicare IM Given:    Additional Medicare Important Message give by:     If discussed at Long Length of Stay Meetings, dates discussed:    Additional Comments:  Gala LewandowskyGraves-Bigelow, Tahjay Binion Kaye, RN 11/21/2015, 3:08 PM

## 2015-11-21 NOTE — Progress Notes (Signed)
Patent vein nursing staff regarding chest pain after cath, EKG obtained at 7:11PM reviewed, previous inferior ST depression has resolved, lateral leads minimal ST depression does not appears to be evolving from previous EKG.  Ramond DialSigned, Caisley Baxendale PA Pager: [phone]

## 2015-11-21 NOTE — Progress Notes (Signed)
ANTICOAGULATION CONSULT NOTE - Follow Up Consult  Pharmacy Consult for heparin Indication: NSTEMI  Allergies  Allergen Reactions  . Vicodin [Hydrocodone-Acetaminophen] Nausea And Vomiting  . Talwin [Pentazocine]   . Valium [Diazepam] Other (See Comments)    Makes her feel like she's flying    Patient Measurements: Height: 5\' 3"  (160 cm) Weight: 145 lb 1.6 oz (65.817 kg) IBW/kg (Calculated) : 52.4  Vital Signs: Temp: 97.9 F (36.6 C) (03/30 0417) Temp Source: Oral (03/30 0417) BP: 110/49 mmHg (03/30 0806) Pulse Rate: 70 (03/30 0417)  Labs:  Recent Labs  11/20/15 1950 11/20/15 2236 11/21/15 0325 11/21/15 0330 11/21/15 0751  HGB 12.3  --  10.6*  --   --   HCT 38.5  --  34.3*  --   --   PLT 184  --  188  --   --   HEPARINUNFRC  --   --   --   --  0.62  CREATININE 0.74  --  0.85  --   --   TROPONINI  --  0.45*  --  1.01*  --     Estimated Creatinine Clearance: 52.2 mL/min (by C-G formula based on Cr of 0.85).   Medications:  Infusions:  . heparin 1,000 Units/hr (11/21/15 0105)  . nitroGLYCERIN 5 mcg/min (11/20/15 2247)    Assessment: 75 y/o female on IV heparin for NSTEMI. Plan is for cath today.  HL is therapeutic at 0.62 on 1000 units/hr. No bleeding noted, Hb down to 10.6, platelets are normal.  Goal of Therapy:  Heparin level 0.3-0.7 units/ml Monitor platelets by anticoagulation protocol: Yes   Plan:  - Continue heparin drip at 1000 units/hr - Daily HL and CBC - Confirm HL if cath cancelled today but will hold off for now  [facility]-MainJennifer [place], VermontPharm.D., BCPS Clinical Pharmacist Pager: [phone] 11/21/2015 8:47 AM

## 2015-11-21 NOTE — H&P (View-Only) (Signed)
 CARDIOLOGY CONSULT NOTE   Patient ID: Leah Small MRN: [id number] DOB/AGE: 05/03/1941 74 y.o.  Admit date: 11/20/2015  Primary Physician   SPARKS,JEFFREY D, MD Primary Cardiologist   Dr. Kowalski ([place]) Reason for Consultation   Chest pain Requesting Physician  Dr. Rai  HPI: Leah Small is a 74 y.o. female with a history of hypertension, diabetes, TIA, peripheral arterial disease with bilateral carotid endarterectomy who previously had bypass surgery in 1997 however all vein grafts are occluded and LIMA to LAD is patent and residual disease is not amenable to intervention or redo bypass and has had multiple admissions for unstable angina and non-ST elevation myocardial infarction and complex seizures on Keppra who presented 11/20/15 mid night for evaluation of chest pain.   Last admission 09/2815-10/24/15 for NSTEMI. Seen by Dr. Aniyiah Zell and treated with IV heparin for 48 hours. Echocardiogram during admission was reassuring ejection fraction. Moderate MR. Pulmonary pressures are elevated, possibly secondary to diastolic dysfunction continue with aggressive medical therapy. Not a candidate for further percutaneous interventions.  Since discharge the patient continued to have exertional chest pain and using more and more SL nitro. Yesterday she has some procedure to her left eye for retinal hemorrhage. Around 7:30 pm the patient has "12/10" episode of substernal chest pain associated with SOB and nausea. The pain radiated to shoulder blade and Jaw. She took SL x 3 however no improvement and EMS was called and given ASA 324mg and nitro pain with decreased in intensity of pain. Per patient last episode of chest pain about 1 month ago. Admits to having intermittent dizzienss and elevated BP. Denies palpitations, orthopnea, PND, syncope, blood in her stool or urine or LE edema.   Troponin of 0.45-->1.01. Hgb of 10.6 BNP of 177.1. Normal Free T4. EKG showed sinus rhythm at rate of 81 bpm and  ST depression in lateral leads which appreas similar to prior EKG. Currently 1/10 chest discomfort. BP was 197/88 upon presentation. Currently stable.    Past Medical History  Diagnosis Date  . Coronary artery disease   . Acid reflux   . Hypertension   . Hypercholesterolemia   . Chronic stable angina (HCC)   . Type II diabetes mellitus (HCC)   . 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 TIA (transient ischemic attack)     "several over the last 20 years" (10/22/2015)  . Arthritis     "all over"  . Psoriatic arthritis (HCC)   . Myocardial infarction (HCC) 1991; 07/06/2015  . NSTEMI (non-ST elevated myocardial infarction) (HCC) 10/22/2015     Past Surgical History  Procedure Laterality Date  . Tonsillectomy  ~ 1947    2nd grade  . Appendectomy  1950s    elementary school  . Cholecystectomy open  ~ 2014  . Abdominal hysterectomy  1973  . Dilation and curettage of uterus  X 1  . 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 Cardiac catheterization  X 2-3  . Carotid stent insertion Bilateral 2011-2012    right-left  . Cataract extraction w/ intraocular lens  implant, bilateral Bilateral 2009-2010  . Coronary angioplasty    . Thrombectomy femoral artery Right ~ 2015    right femoral artery occlusion/notes 12/13/2014    Allergies  Allergen Reactions  . Vicodin [Hydrocodone-Acetaminophen] Nausea And Vomiting  . Talwin [Pentazocine]   . Valium [Diazepam] Other (See Comments)    Makes her feel like she's flying      I have reviewed the patient's current medications . aspirin EC  81 mg Oral Daily  . atorvastatin  80 mg Oral q1800  . brimonidine  1 drop Both Eyes BID  . calcium carbonate  1 tablet Oral Daily  . clopidogrel  75 mg Oral Daily  . insulin aspart  0-15 Units Subcutaneous TID WC  . insulin glargine  20 Units Subcutaneous QHS  . isosorbide mononitrate  60 mg Oral Daily  . latanoprost  1 drop Both Eyes QHS  .  levETIRAcetam  750 mg Oral BID  . magnesium oxide  200 mg Oral Daily  . metoprolol tartrate  12.5 mg Oral BID  . pantoprazole  40 mg Oral Daily  . sodium chloride flush  3 mL Intravenous Q12H  . timolol  1 drop Both Eyes BID  . vitamin B-12  1,000 mcg Oral Daily  . vitamin E  400 Units Oral Daily   . heparin 1,000 Units/hr (11/21/15 0105)  . nitroGLYCERIN 5 mcg/min (11/20/15 2247)   sodium chloride, acetaminophen, metoprolol, ondansetron (ZOFRAN) IV, sodium chloride flush, triamcinolone ointment, zolpidem  Prior to Admission medications   Medication Sig Start Date End Date Taking? Authorizing Provider  aspirin EC 81 MG tablet Take 81 mg by mouth at bedtime.   Yes Historical Provider, MD  atorvastatin (LIPITOR) 40 MG tablet Take 1 tablet (40 mg total) by mouth at bedtime. Patient taking differently: Take 20 mg by mouth at bedtime.  07/10/15  Yes Nayana Abrol, MD  bimatoprost (LUMIGAN) 0.01 % SOLN Place 1 drop into both eyes at bedtime.   Yes Historical Provider, MD  brimonidine (ALPHAGAN) 0.2 % ophthalmic solution Place 1 drop into both eyes 2 (two) times daily.    Yes Historical Provider, MD  CALCIUM PO Take 1 tablet by mouth daily.   Yes Historical Provider, MD  clopidogrel (PLAVIX) 75 MG tablet Take 75 mg by mouth daily.   Yes Historical Provider, MD  glipiZIDE (GLUCOTROL XL) 10 MG 24 hr tablet Take 10 mg by mouth every morning. 04/26/15  Yes Historical Provider, MD  Insulin Glargine (LANTUS SOLOSTAR) 100 UNIT/ML Solostar Pen Inject 24 Units into the skin at bedtime.   Yes Historical Provider, MD  insulin lispro (HUMALOG) 100 UNIT/ML injection Inject 6-8 Units into the skin 3 (three) times daily before meals. Per sliding scale: 6 units plus:   CBG 150-200 add 1 unit, 201-250 add 2 units, 251-300 add 3 units, 301-350 add 4 units 351-400 add 5 units, >400 add 6 units and call MD   Yes Historical Provider, MD  isosorbide mononitrate (IMDUR) 60 MG 24 hr tablet Take 1 tablet (60 mg total) by  mouth daily. 10/24/15  Yes Abraham Feliz Ortiz, MD  levETIRAcetam (KEPPRA) 750 MG tablet Take 750 mg by mouth 2 (two) times daily.   Yes Historical Provider, MD  MAGNESIUM PO Take 1 tablet by mouth daily.   Yes Historical Provider, MD  metFORMIN (GLUCOPHAGE) 1000 MG tablet Take 1,000 mg by mouth 2 (two) times daily. 04/25/15  Yes Historical Provider, MD  metoprolol tartrate (LOPRESSOR) 25 MG tablet Take 25 mg by mouth at bedtime.   Yes Historical Provider, MD  Multiple Vitamins-Minerals (ZINC PO) Take 1 tablet by mouth daily.   Yes Historical Provider, MD  nitroGLYCERIN (NITROSTAT) 0.4 MG SL tablet Place 0.4 mg under the tongue every 5 (five) minutes as needed for chest pain.   Yes Historical Provider, MD  nystatin cream (MYCOSTATIN) Apply 1 application topically 2 (two) times daily as   needed (yeast infection).  04/25/15  Yes Historical Provider, MD  pantoprazole (PROTONIX) 40 MG tablet Take 1 tablet (40 mg total) by mouth daily. 07/10/15  Yes Nayana Abrol, MD  timolol (BETIMOL) 0.5 % ophthalmic solution Place 1 drop into both eyes 2 (two) times daily.   Yes Historical Provider, MD  triamcinolone ointment (KENALOG) 0.1 % Apply 1 application topically 3 (three) times daily as needed (itching from psoriasis).   Yes Historical Provider, MD  vitamin B-12 (CYANOCOBALAMIN) 1000 MCG tablet Take 1,000 mcg by mouth daily.   Yes Historical Provider, MD  vitamin E 400 UNIT capsule Take 400 Units by mouth daily.   Yes Historical Provider, MD     Social History   Social History  . Marital Status: Widowed    Spouse Name: N/A  . Number of Children: N/A  . Years of Education: N/A   Occupational History  . Not on file.   Social History Main Topics  . Smoking status: Never Smoker   . Smokeless tobacco: Never Used  . Alcohol Use: No  . Drug Use: No  . Sexual Activity: No   Other Topics Concern  . Not on file   Social History Narrative    No family status information on file.   Family History  Problem  Relation Age of Onset  . Heart attack Father   . Diabetes Mother   . Cancer Mother   . Stroke Mother      ROS:  Full 14 point review of systems complete and found to be negative unless listed above.  Physical Exam: Blood pressure 116/56, pulse 70, temperature 97.9 F (36.6 C), temperature source Oral, resp. rate 20, height 5' 3" (1.6 m), weight 145 lb 1.6 oz (65.817 kg), SpO2 98 %.  General: Well developed, well nourished, female in no acute distress Head: Eyes PERRLA, No xanthomas. Normocephalic and atraumatic, oropharynx without edema or exudate.  Lungs: Resp regular and unlabored, CTA. Heart: RRR no s3, s4, or murmurs..   Neck: No carotid bruits. No lymphadenopathy.  No JVD. Abdomen: Bowel sounds present, abdomen soft and non-tender without masses or hernias noted. Msk:  No spine or cva tenderness. No weakness, no joint deformities or effusions. Extremities: No clubbing, cyanosis or edema. DP/PT/Radials 2+ and equal bilaterally. Neuro: Alert and oriented X 3. No focal deficits noted. Psych:  Good affect, responds appropriately Skin: No rashes or lesions noted.  Labs:   Lab Results  Component Value Date   WBC 5.9 11/21/2015   HGB 10.6* 11/21/2015   HCT 34.3* 11/21/2015   MCV 89.6 11/21/2015   PLT 188 11/21/2015   No results for input(s): INR in the last 72 hours.  Recent Labs Lab 11/21/15 0325  NA 139  K 4.3  CL 104  CO2 22  BUN 13  CREATININE 0.85  CALCIUM 9.2  GLUCOSE 121*   No results found for: MG  Recent Labs  11/20/15 2236 11/21/15 0330  TROPONINI 0.45* 1.01*    Recent Labs  11/20/15 1958  TROPIPOC 0.03   No results found for: PROBNP Lab Results  Component Value Date   CHOL 158 07/07/2015   HDL 43 07/07/2015   LDLCALC 82 07/07/2015   TRIG 167* 07/07/2015   No results found for: DDIMER No results found for: LIPASE, AMYLASE TSH  Date/Time Value Ref Range Status  07/07/2015 03:34 PM 2.915 0.350 - 4.500 uIU/mL Final   VITAMIN B-12    Date/Time Value Ref Range Status  07/08/2015 09:40 AM 310 180 -   914 pg/mL Final    Comment:    (NOTE) This assay is not validated for testing neonatal or myeloproliferative syndrome specimens for Vitamin B12 levels.     Echo: 10/23/15 LV EF: 50% - 55%  ------------------------------------------------------------------- Indications: Chest pain 786.51.  ------------------------------------------------------------------- History: PMH: NSTEMI. Diabetes with complication. Left-sided weakness. Complex partial seizure disorder. CAD.  ------------------------------------------------------------------- Study Conclusions  - Left ventricle: The cavity size was mildly dilated. Systolic  function was normal. The estimated ejection fraction was in the  range of 50% to 55%. Wall motion was normal; there were no  regional wall motion abnormalities. Doppler parameters are  consistent with a reversible restrictive pattern, indicative of  decreased left ventricular diastolic compliance and/or increased  left atrial pressure (grade 3 diastolic dysfunction). Doppler  parameters are consistent with high ventricular filling pressure. - Aortic valve: Transvalvular velocity was within the normal range.  There was no stenosis. There was no regurgitation. - Mitral valve: Severely calcified annulus. Transvalvular velocity  was within the normal range. There was no evidence for stenosis.  There was moderate regurgitation. - Left atrium: The atrium was moderately to severely dilated. - Right ventricle: The cavity size was normal. Wall thickness was  normal. Systolic function was normal. - Tricuspid valve: There was moderate-severe regurgitation. - Pulmonary arteries: Systolic pressure was severely increased. PA  peak pressure: 63 mm Hg (S). - Inferior vena cava: The vessel was normal in size. The  respirophasic diameter changes were in the normal range (>= 50%),  consistent  with normal central venous pressure.   ECG:   EKG showed sinus rhythm at rate of 81 bpm and ST depression in lateral leads which appreas similar to prior EKG.   Vent. rate 81 BPM PR interval 130 ms QRS duration 118 ms QT/QTc 397/461 ms P-R-T axes 89 75 129   Radiology:  Dg Chest 2 View  11/20/2015  CLINICAL DATA:  Chest pain for 1 day EXAM: CHEST  2 VIEW COMPARISON:  July 18, 2015 FINDINGS: There is no edema or consolidation. Heart remains upper normal in size with pulmonary vascular within normal limits. Patient is status post coronary artery bypass grafting. No evidence of adenopathy. No bone lesions. No pneumothorax apparent. IMPRESSION: No edema or consolidation.  Stable cardiac silhouette. Electronically Signed   By: William  Woodruff III M.D.   On: 11/20/2015 20:47    ASSESSMENT AND PLAN:     1. Unstable angina/NSTEMI - Recurrent admission due to this. Worsening anginal pain despite on aggressive medical therapy. Troponin of 0.45-->1.01. Hgb of 10.6 BNP of 177.1. Normal Free T4. EKG showed sinus rhythm at rate of 81 bpm and ST depression in lateral leads which appreas similar to prior EKG - Continue IV heparin and IV nitro. Continue ASA, lipitor, plavix, BB and Imdur.  - Per records no candidate for further percutaneous interventions. Will do cath for definite evaluation.  - The patient understands that risks include but are not limited to stroke (1 in 1000), death (1 in 1000), kidney failure [usually temporary] (1 in 500), bleeding (1 in 200), allergic reaction [possibly serious] (1 in 200), and agrees to proceed.  - Echocardiogram 3/1 was reassuring ejection fraction. Moderate MR. Pulmonary pressures are elevated, possibly secondary to diastolic dysfunction .  2. Hypertensive urgency - BP was 197/88 upon presentation. Now stable. Continue current therapy. Add ACE/ARB?     Diabetes mellitus with complication (HCC)   Essential hypertension   HLD (hyperlipidemia)   Complex  partial seizure disorder (HCC)     Coronary atherosclerosis of native coronary artery   Signed: Bhagat,Bhavinkumar, PA 11/21/2015, 8:04 AM Pager [phone]  Co-Sign MD  Personally seen and examined. Agree with above. With troponin rising again, NSTEMI, will proceed with heart cath. Perhaps now there is possible target for PCI. She understands that medical mgt. May be only option.  Lungs CTAB, Heart RRR  [employer], MD   

## 2015-11-21 NOTE — Consult Note (Signed)
CARDIOLOGY CONSULT NOTE   Patient ID: Leah Small MRN: [id number] DOB/AGE: [birth year] 76 y.o.  Admit date: 11/20/2015  Primary Physician   SPARKS,JEFFREY D, MD Primary Cardiologist   Dr. Gwen Pounds [facility]) Reason for Consultation   Chest pain Requesting Physician  Dr. Isidoro Donning  HPI: Leah Small is a 75 y.o. female with a history of hypertension, diabetes, TIA, peripheral arterial disease with bilateral carotid endarterectomy who previously had bypass surgery in 1997 however all vein grafts are occluded and LIMA to LAD is patent and residual disease is not amenable to intervention or redo bypass and has had multiple admissions for unstable angina and non-ST elevation myocardial infarction and complex seizures on Keppra who presented 11/20/15 mid night for evaluation of chest pain.   Last admission 09/2815-10/24/15 for NSTEMI. Seen by Dr. Anne Fu and treated with IV heparin for 48 hours. Echocardiogram during admission was reassuring ejection fraction. Moderate MR. Pulmonary pressures are elevated, possibly secondary to diastolic dysfunction continue with aggressive medical therapy. Not a candidate for further percutaneous interventions.  Since discharge the patient continued to have exertional chest pain and using more and more SL nitro. Yesterday she has some procedure to her left eye for retinal hemorrhage. Around 7:30 pm the patient has "12/10" episode of substernal chest pain associated with SOB and nausea. The pain radiated to shoulder blade and Jaw. She took SL x 3 however no improvement and EMS was called and given ASA  and nitro pain with decreased in intensity of pain. Per patient last episode of chest pain about 1 month ago. Admits to having intermittent dizzienss and elevated BP. Denies palpitations, orthopnea, PND, syncope, blood in her stool or urine or LE edema.   Troponin of 0.45-->1.01. Hgb of 10.6 BNP of 177.1. Normal Free T4. EKG showed sinus rhythm at rate of 81 bpm and  ST depression in lateral leads which appreas similar to prior EKG. Currently 1/10 chest discomfort. BP was 197/88 upon presentation. Currently stable.    Past Medical History  Diagnosis Date  . Coronary artery disease   . Acid reflux   . Hypertension   . Hypercholesterolemia   . Chronic stable angina (HCC)   . Type II diabetes mellitus (HCC)   . 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 TIA (transient ischemic attack)     "several over the last 20 years" (10/22/2015)  . Arthritis     "all over"  . Psoriatic arthritis (HCC)   . Myocardial infarction (HCC) 1991; 07/06/2015  . NSTEMI (non-ST elevated myocardial infarction) (HCC) 10/22/2015     Past Surgical History  Procedure Laterality Date  . Tonsillectomy  ~ 1947    2nd grade  . Appendectomy  1950s    elementary school  . Cholecystectomy open  ~ 2014  . Abdominal hysterectomy  1973  . Dilation and curettage of uterus  X 1  . 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 Cardiac catheterization  X 2-3  . Carotid stent insertion Bilateral 2011-2012    right-left  . Cataract extraction w/ intraocular lens  implant, bilateral Bilateral 2009-2010  . Coronary angioplasty    . Thrombectomy femoral artery Right ~ 2015    right femoral artery occlusion/notes 12/13/2014    Allergies  Allergen Reactions  . Vicodin [Hydrocodone-Acetaminophen] Nausea And Vomiting  . Talwin [Pentazocine]   . Valium [Diazepam] Other (See Comments)    Makes her feel like she's flying  I have reviewed the patient's current medications . aspirin EC  81 mg Oral Daily  . atorvastatin  80 mg Oral q1800  . brimonidine  1 drop Both Eyes BID  . calcium carbonate  1 tablet Oral Daily  . clopidogrel  75 mg Oral Daily  . insulin aspart  0-15 Units Subcutaneous TID WC  . insulin glargine  20 Units Subcutaneous QHS  . isosorbide mononitrate  60 mg Oral Daily  . latanoprost  1 drop Both Eyes QHS  .  levETIRAcetam  750 mg Oral BID  . magnesium oxide  200 mg Oral Daily  . metoprolol tartrate  12.5 mg Oral BID  . pantoprazole  40 mg Oral Daily  . sodium chloride flush  3 mL Intravenous Q12H  . timolol  1 drop Both Eyes BID  . vitamin B-12  1,000 mcg Oral Daily  . vitamin E  400 Units Oral Daily   . heparin 1,000 Units/hr (11/21/15 0105)  . nitroGLYCERIN 5 mcg/min (11/20/15 2247)   sodium chloride, acetaminophen, metoprolol, ondansetron (ZOFRAN) IV, sodium chloride flush, triamcinolone ointment, zolpidem  Prior to Admission medications   Medication Sig Start Date End Date Taking? Authorizing Provider  aspirin EC 81 MG tablet Take 81 mg by mouth at bedtime.   Yes Historical Provider, MD  atorvastatin (LIPITOR) 40 MG tablet Take 1 tablet (40 mg total) by mouth at bedtime. Patient taking differently: Take 20 mg by mouth at bedtime.  07/10/15  Yes Richarda OverlieNayana Abrol, MD  bimatoprost (LUMIGAN) 0.01 % SOLN Place 1 drop into both eyes at bedtime.   Yes Historical Provider, MD  brimonidine (ALPHAGAN) 0.2 % ophthalmic solution Place 1 drop into both eyes 2 (two) times daily.    Yes Historical Provider, MD  CALCIUM PO Take 1 tablet by mouth daily.   Yes Historical Provider, MD  clopidogrel (PLAVIX) 75 MG tablet Take 75 mg by mouth daily.   Yes Historical Provider, MD  glipiZIDE (GLUCOTROL XL) 10 MG 24 hr tablet Take 10 mg by mouth every morning. 04/26/15  Yes Historical Provider, MD  Insulin Glargine (LANTUS SOLOSTAR) 100 UNIT/ML Solostar Pen Inject 24 Units into the skin at bedtime.   Yes Historical Provider, MD  insulin lispro (HUMALOG) 100 UNIT/ML injection Inject 6-8 Units into the skin 3 (three) times daily before meals. Per sliding scale: 6 units plus:   CBG 150-200 add 1 unit, 201-250 add 2 units, 251-300 add 3 units, 301-350 add 4 units 351-400 add 5 units, >400 add 6 units and call MD   Yes Historical Provider, MD  isosorbide mononitrate (IMDUR) 60 MG 24 hr tablet Take 1 tablet (60 mg total) by  mouth daily. 10/24/15  Yes Marinda ElkAbraham Feliz Ortiz, MD  levETIRAcetam (KEPPRA) 750 MG tablet Take 750 mg by mouth 2 (two) times daily.   Yes Historical Provider, MD  MAGNESIUM PO Take 1 tablet by mouth daily.   Yes Historical Provider, MD  metFORMIN (GLUCOPHAGE) 1000 MG tablet Take 1,000 mg by mouth 2 (two) times daily. 04/25/15  Yes Historical Provider, MD  metoprolol tartrate (LOPRESSOR) 25 MG tablet Take 25 mg by mouth at bedtime.   Yes Historical Provider, MD  Multiple Vitamins-Minerals (ZINC PO) Take 1 tablet by mouth daily.   Yes Historical Provider, MD  nitroGLYCERIN (NITROSTAT) 0.4 MG SL tablet Place 0.4 mg under the tongue every 5 (five) minutes as needed for chest pain.   Yes Historical Provider, MD  nystatin cream (MYCOSTATIN) Apply 1 application topically 2 (two) times daily as  needed (yeast infection).  04/25/15  Yes Historical Provider, MD  pantoprazole (PROTONIX) 40 MG tablet Take 1 tablet (40 mg total) by mouth daily. 07/10/15  Yes Richarda Overlie, MD  timolol (BETIMOL) 0.5 % ophthalmic solution Place 1 drop into both eyes 2 (two) times daily.   Yes Historical Provider, MD  triamcinolone ointment (KENALOG) 0.1 % Apply 1 application topically 3 (three) times daily as needed (itching from psoriasis).   Yes Historical Provider, MD  vitamin B-12 (CYANOCOBALAMIN) 1000 MCG tablet Take 1,000 mcg by mouth daily.   Yes Historical Provider, MD  vitamin E 400 UNIT capsule Take 400 Units by mouth daily.   Yes Historical Provider, MD     Social History   Social History  . Marital Status: Widowed    Spouse Name: N/A  . Number of Children: N/A  . Years of Education: N/A   Occupational History  . Not on file.   Social History Main Topics  . Smoking status: Never Smoker   . Smokeless tobacco: Never Used  . Alcohol Use: No  . Drug Use: No  . Sexual Activity: No   Other Topics Concern  . Not on file   Social History Narrative    No family status information on file.   Family History  Problem  Relation Age of Onset  . Heart attack Father   . Diabetes Mother   . Cancer Mother   . Stroke Mother      ROS:  Full 14 point review of systems complete and found to be negative unless listed above.  Physical Exam: Blood pressure 116/56, pulse 70, temperature 97.9 F (36.6 C), temperature source Oral, resp. rate 20, height 5\' 3"  (1.6 m), weight 145 lb 1.6 oz (65.817 kg), SpO2 98 %.  General: Well developed, well nourished, female in no acute distress Head: Eyes PERRLA, No xanthomas. Normocephalic and atraumatic, oropharynx without edema or exudate.  Lungs: Resp regular and unlabored, CTA. Heart: RRR no s3, s4, or murmurs..   Neck: No carotid bruits. No lymphadenopathy.  No JVD. Abdomen: Bowel sounds present, abdomen soft and non-tender without masses or hernias noted. Msk:  No spine or cva tenderness. No weakness, no joint deformities or effusions. Extremities: No clubbing, cyanosis or edema. DP/PT/Radials 2+ and equal bilaterally. Neuro: Alert and oriented X 3. No focal deficits noted. Psych:  Good affect, responds appropriately Skin: No rashes or lesions noted.  Labs:   Lab Results  Component Value Date   WBC 5.9 11/21/2015   HGB 10.6* 11/21/2015   HCT 34.3* 11/21/2015   MCV 89.6 11/21/2015   PLT 188 11/21/2015   No results for input(s): INR in the last 72 hours.  Recent Labs Lab 11/21/15 0325  NA 139  K 4.3  CL 104  CO2 22  BUN 13  CREATININE 0.85  CALCIUM 9.2  GLUCOSE 121*   No results found for: MG  Recent Labs  11/20/15 2236 11/21/15 0330  TROPONINI 0.45* 1.01*    Recent Labs  11/20/15 1958  TROPIPOC 0.03   No results found for: PROBNP Lab Results  Component Value Date   CHOL 158 07/07/2015   HDL 43 07/07/2015   LDLCALC 82 07/07/2015   TRIG 167* 07/07/2015   No results found for: DDIMER No results found for: LIPASE, AMYLASE TSH  Date/Time Value Ref Range Status  07/07/2015 03:34 PM 2.915 0.350 - 4.500 uIU/mL Final   VITAMIN B-12    Date/Time Value Ref Range Status  07/08/2015 09:40 AM 310 180 -  914 pg/mL Final    Comment:    (NOTE) This assay is not validated for testing neonatal or myeloproliferative syndrome specimens for Vitamin B12 levels.     Echo: 10/23/15 LV EF: 50% - 55%  ------------------------------------------------------------------- Indications: Chest pain 786.51.  ------------------------------------------------------------------- History: PMH: NSTEMI. Diabetes with complication. Left-sided weakness. Complex partial seizure disorder. CAD.  ------------------------------------------------------------------- Study Conclusions  - Left ventricle: The cavity size was mildly dilated. Systolic  function was normal. The estimated ejection fraction was in the  range of 50% to 55%. Wall motion was normal; there were no  regional wall motion abnormalities. Doppler parameters are  consistent with a reversible restrictive pattern, indicative of  decreased left ventricular diastolic compliance and/or increased  left atrial pressure (grade 3 diastolic dysfunction). Doppler  parameters are consistent with high ventricular filling pressure. - Aortic valve: Transvalvular velocity was within the normal range.  There was no stenosis. There was no regurgitation. - Mitral valve: Severely calcified annulus. Transvalvular velocity  was within the normal range. There was no evidence for stenosis.  There was moderate regurgitation. - Left atrium: The atrium was moderately to severely dilated. - Right ventricle: The cavity size was normal. Wall thickness was  normal. Systolic function was normal. - Tricuspid valve: There was moderate-severe regurgitation. - Pulmonary arteries: Systolic pressure was severely increased. PA  peak pressure: 63 mm Hg (S). - Inferior vena cava: The vessel was normal in size. The  respirophasic diameter changes were in the normal range (>= 50%),  consistent  with normal central venous pressure.   ECG:   EKG showed sinus rhythm at rate of 81 bpm and ST depression in lateral leads which appreas similar to prior EKG.   Vent. rate 81 BPM PR interval 130 ms QRS duration 118 ms QT/QTc 397/461 ms P-R-T axes 89 75 129   Radiology:  Dg Chest 2 View  11/20/2015  CLINICAL DATA:  Chest pain for 1 day EXAM: CHEST  2 VIEW COMPARISON:  July 18, 2015 FINDINGS: There is no edema or consolidation. Heart remains upper normal in size with pulmonary vascular within normal limits. Patient is status post coronary artery bypass grafting. No evidence of adenopathy. No bone lesions. No pneumothorax apparent. IMPRESSION: No edema or consolidation.  Stable cardiac silhouette. Electronically Signed   By: Bretta Bang III M.D.   On: 11/20/2015 20:47    ASSESSMENT AND PLAN:     1. Unstable angina/NSTEMI - Recurrent admission due to this. Worsening anginal pain despite on aggressive medical therapy. Troponin of 0.45-->1.01. Hgb of 10.6 BNP of 177.1. Normal Free T4. EKG showed sinus rhythm at rate of 81 bpm and ST depression in lateral leads which appreas similar to prior EKG - Continue IV heparin and IV nitro. Continue ASA, lipitor, plavix, BB and Imdur.  - Per records no candidate for further percutaneous interventions. Will do cath for definite evaluation.  - The patient understands that risks include but are not limited to stroke (1 in 1000), death (1 in 1000), kidney failure [usually temporary] (1 in 500), bleeding (1 in 200), allergic reaction [possibly serious] (1 in 200), and agrees to proceed.  - Echocardiogram 3/1 was reassuring ejection fraction. Moderate MR. Pulmonary pressures are elevated, possibly secondary to diastolic dysfunction .  2. Hypertensive urgency - BP was 197/88 upon presentation. Now stable. Continue current therapy. Add ACE/ARB?     Diabetes mellitus with complication (HCC)   Essential hypertension   HLD (hyperlipidemia)   Complex  partial seizure disorder (HCC)  Coronary atherosclerosis of native coronary artery   Signed: Bhagat,Bhavinkumar, PA 11/21/2015, 8:04 AM Pager [phone]  Co-Sign MD  Personally seen and examined. Agree with above. With troponin rising again, NSTEMI, will proceed with heart cath. Perhaps now there is possible target for PCI. She understands that medical mgt. May be only option.  Lungs CTAB, Heart RRR  Donato Schultz, MD

## 2015-11-21 NOTE — Interval H&P Note (Signed)
Cath Lab Visit (complete for each Cath Lab visit)  Clinical Evaluation Leading to the Procedure:   ACS: Yes.    Non-ACS:    Anginal Classification: CCS IV  Anti-ischemic medical therapy: Maximal Therapy (2 or more classes of medications)  Non-Invasive Test Results: No non-invasive testing performed  Prior CABG: Previous CABG   Was told in the past there there were no targets for revascularization, by Dr. Gwen PoundsKowalski.   History and Physical Interval Note:  11/21/2015 3:19 PM  Merrit R Delford FieldWright  has presented today for surgery, with the diagnosis of cp  The various methods of treatment have been discussed with the patient and family. After consideration of risks, benefits and other options for treatment, the patient has consented to  Procedure(s): Left Heart Cath and Coronary Angiography (N/A) as a surgical intervention .  The patient's history has been reviewed, patient examined, no change in status, stable for surgery.  I have reviewed the patient's chart and labs.  Questions were answered to the patient's satisfaction.     Ying Blankenhorn S.

## 2015-11-21 NOTE — Progress Notes (Signed)
Site area: right groin  Site Prior to Removal:  Level 0  Pressure Applied For 15 MINUTES    Minutes Beginning at 19:44  Manual:   Yes.    Patient Status During Pull:  WNL  Post Pull Groin Site:  Level 0  Post Pull Instructions Given:  Yes.    Post Pull Pulses Present:  Yes.    Dressing Applied:  Yes.    Comments:

## 2015-11-21 NOTE — Plan of Care (Signed)
Problem: Consults Goal: Cardiac Cath Patient Education (See Patient Education module for education specifics.)  Outcome: Completed/Met Date Met:  11/21/15 Pt educated on cardiac cath with possible coronary intervention. Pt refused video. Pt states they have had this procedure several times in the past and feels well educated. Son at bedside. All questions answered. Pre-cath fluids going at 100cc. Pt NPO

## 2015-11-22 ENCOUNTER — Encounter (HOSPITAL_COMMUNITY): Payer: Self-pay | Admitting: Interventional Cardiology

## 2015-11-22 DIAGNOSIS — F4489 Other dissociative and conversion disorders: Secondary | ICD-10-CM

## 2015-11-22 DIAGNOSIS — I209 Angina pectoris, unspecified: Secondary | ICD-10-CM

## 2015-11-22 LAB — BASIC METABOLIC PANEL
ANION GAP: 7 (ref 5–15)
BUN: 12 mg/dL (ref 6–20)
CALCIUM: 8.7 mg/dL — AB (ref 8.9–10.3)
CO2: 23 mmol/L (ref 22–32)
CREATININE: 0.79 mg/dL (ref 0.44–1.00)
Chloride: 107 mmol/L (ref 101–111)
Glucose, Bld: 142 mg/dL — ABNORMAL HIGH (ref 65–99)
Potassium: 4.1 mmol/L (ref 3.5–5.1)
SODIUM: 137 mmol/L (ref 135–145)

## 2015-11-22 LAB — LIPID PANEL
Cholesterol: 156 mg/dL (ref 0–200)
HDL: 44 mg/dL (ref >40)
LDL Cholesterol: 80 mg/dL (ref 0–99)
TRIGLYCERIDES: 161 mg/dL — AB (ref <150)
Total CHOL/HDL Ratio: 3.5 RATIO
VLDL: 32 mg/dL (ref 0–40)

## 2015-11-22 LAB — CBC
HEMATOCRIT: 34.6 % — AB (ref 36.0–46.0)
Hemoglobin: 10.7 g/dL — ABNORMAL LOW (ref 12.0–15.0)
MCH: 27.8 pg (ref 26.0–34.0)
MCHC: 30.9 g/dL (ref 30.0–36.0)
MCV: 89.9 fL (ref 78.0–100.0)
PLATELETS: 183 10*3/uL (ref 150–400)
RBC: 3.85 MIL/uL — ABNORMAL LOW (ref 3.87–5.11)
RDW: 14.8 % (ref 11.5–15.5)
WBC: 6.4 10*3/uL (ref 4.0–10.5)

## 2015-11-22 LAB — HEMOGLOBIN A1C
HEMOGLOBIN A1C: 6.9 % — AB (ref 4.8–5.6)
Mean Plasma Glucose: 151 mg/dL

## 2015-11-22 LAB — GLUCOSE, CAPILLARY: Glucose-Capillary: 132 mg/dL — ABNORMAL HIGH (ref 65–99)

## 2015-11-22 MED ORDER — ISOSORBIDE MONONITRATE ER 30 MG PO TB24: 30.0000 mg | ORAL_TABLET | Freq: Every day | ORAL | Status: DC

## 2015-11-22 MED ORDER — ATORVASTATIN CALCIUM 80 MG PO TABS: 80.0000 mg | ORAL_TABLET | Freq: Every day | ORAL | Status: DC

## 2015-11-22 MED ORDER — SODIUM CHLORIDE 0.9 % IV BOLUS (SEPSIS): 600.0000 mL | Freq: Once | INTRAVENOUS | Status: DC

## 2015-11-22 MED ORDER — SODIUM CHLORIDE 0.9 % IV BOLUS (SEPSIS)
600.0000 mL | Freq: Once | INTRAVENOUS | Status: AC
  Administered 2015-11-22: 600 mL via INTRAVENOUS

## 2015-11-22 MED FILL — Heparin Sodium (Porcine) 2 Unit/ML in Sodium Chloride 0.9%: INTRAMUSCULAR | Qty: 1000 | Status: AC

## 2015-11-22 NOTE — Progress Notes (Signed)
    Feels good. No CP, no SOB. Has some post cath CP. Resolved. BP low, IVF, no symptoms. Takes Imdur 30 at home at night. Here we gave her 60mg .    Cath site intact. No hematoma Lungs CTAB Heart RRR  Continue DAPT  New Ramus stent.   There is severe three-vessel coronary artery disease including a diffusely diseased LAD, occluded RCA and heavily diseased circumflex.  Ramus lesion, 90% stenosed. Post intervention with a 2.25 x 14 resolute drug-eluting stent, postdilated to 2.3 mm, there is a 0% residual stenosis.  Patent LIMA to LAD. Vein grafts to RCA, circumflex and diagonal are all occluded.  Continue dual antiplatelet therapy. She did have typical angina symptoms during balloon inflations. Hopefully, she now has better flow to the lateral wall and better flow to collaterals which originate from the ramus.  She does have laser eye surgery coming up. Hopefully, this can be done with the patient on clopidogrel. As long as she stays on the clopidogrel, aspirin could be stopped if necessary.  Continue aggressive secondary prevention as well.  OK for DC  Follow up Dr. Gwen PoundsKowalski in 1-2 weeks  Donato SchultzSKAINS, Leah Wooley, MD

## 2015-11-22 NOTE — Discharge Summary (Signed)
Physician Discharge Summary   Patient ID: LOGEN FOWLE MRN: [id number] DOB/AGE: April 07, 1941 75 y.o.  Admit date: 11/20/2015 Discharge date: 11/22/2015  Primary Care Physician:  Marguarite Arbour, MD  Discharge Diagnoses:   . Unstable angina (HCC) . Diabetes mellitus with complication (HCC) . Essential hypertension . HLD (hyperlipidemia) . Coronary atherosclerosis of native coronary artery . Complex partial seizure disorder [facility])  Consults: Cardiology  Recommendations for Outpatient Follow-up:  1. Please repeat CBC/BMET at next visit 2. Please add ACE/ARB at the follow-up appointment  DIET: carb modify diet    Allergies:   Allergies  Allergen Reactions  . Vicodin [Hydrocodone-Acetaminophen] Nausea And Vomiting  . Talwin [Pentazocine]   . Valium [Diazepam] Other (See Comments)    Makes her feel like she's flying     DISCHARGE MEDICATIONS: Discharge Medication List as of 11/22/2015 11:26 AM    CONTINUE these medications which have CHANGED   Details  atorvastatin (LIPITOR) 80 MG tablet Take 1 tablet (80 mg total) by mouth at bedtime., Starting 11/22/2015, Until Discontinued, Print      CONTINUE these medications which have NOT CHANGED   Details  aspirin EC 81 MG tablet Take 81 mg by mouth at bedtime., Until Discontinued, Historical Med    bimatoprost (LUMIGAN) 0.01 % SOLN Place 1 drop into both eyes at bedtime., Until Discontinued, Historical Med    brimonidine (ALPHAGAN) 0.2 % ophthalmic solution Place 1 drop into both eyes 2 (two) times daily. , Until Discontinued, Historical Med    CALCIUM PO Take 1 tablet by mouth daily., Until Discontinued, Historical Med    clopidogrel (PLAVIX) 75 MG tablet Take 75 mg by mouth daily., Until Discontinued, Historical Med    glipiZIDE (GLUCOTROL XL) 10 MG 24 hr tablet Take 10 mg by mouth every morning., Starting 04/26/2015, Until Discontinued, Historical Med    Insulin Glargine (LANTUS SOLOSTAR) 100 UNIT/ML Solostar Pen Inject  24 Units into the skin at bedtime., Until Discontinued, Historical Med    insulin lispro (HUMALOG) 100 UNIT/ML injection Inject 6-8 Units into the skin 3 (three) times daily before meals. Per sliding scale: 6 units plus:   CBG 150-200 add 1 unit, 201-250 add 2 units, 251-300 add 3 units, 301-350 add 4 units 351-400 add 5 units, >400 add 6 units and call MD, Until Discontin ued, Historical Med    levETIRAcetam (KEPPRA) 750 MG tablet Take 750 mg by mouth 2 (two) times daily., Until Discontinued, Historical Med    MAGNESIUM PO Take 1 tablet by mouth daily., Until Discontinued, Historical Med    metFORMIN (GLUCOPHAGE) 1000 MG tablet Take 1,000 mg by mouth 2 (two) times daily., Starting 04/25/2015, Until Discontinued, Historical Med    metoprolol tartrate (LOPRESSOR) 25 MG tablet Take 25 mg by mouth at bedtime., Until Discontinued, Historical Med    Multiple Vitamins-Minerals (ZINC PO) Take 1 tablet by mouth daily., Until Discontinued, Historical Med    nitroGLYCERIN (NITROSTAT) 0.4 MG SL tablet Place 0.4 mg under the tongue every 5 (five) minutes as needed for chest pain., Until Discontinued, Historical Med    nystatin cream (MYCOSTATIN) Apply 1 application topically 2 (two) times daily as needed (yeast infection). , Starting 04/25/2015, Until Discontinued, Historical Med    pantoprazole (PROTONIX) 40 MG tablet Take 1 tablet (40 mg total) by mouth daily., Starting 07/10/2015, Until Discontinued, Print    timolol (BETIMOL) 0.5 % ophthalmic solution Place 1 drop into both eyes 2 (two) times daily., Until Discontinued, Historical Med    triamcinolone ointment (KENALOG) 0.1 %  Apply 1 application topically 3 (three) times daily as needed (itching from psoriasis)., Until Discontinued, Historical Med    vitamin B-12 (CYANOCOBALAMIN) 1000 MCG tablet Take 1,000 mcg by mouth daily., Until Discontinued, Historical Med    vitamin E 400 UNIT capsule Take 400 Units by mouth daily., Until Discontinued, Historical  Med    isosorbide mononitrate (IMDUR) 60 MG 24 hr tablet Take 1 tablet (60 mg total) by mouth daily., Starting 10/24/2015, Until Discontinued, Normal         Brief H and P: For complete details please refer to admission H and P, but in briefThe patient is a 75 year old female with insulin-dependent diabetes mellitus, hypertension, hyperlipidemia, seizure disorder, and CAD status post CABG in 1997 and subsequent coronary angiography demonstrating diffuse recurrent disease who now presents to the ED with severe substernal chest pain at rest. Patient has recurrent admissions with similar symptoms, previously has been treated with medical therapy. She reported acute sternal substernal chest pain with nausea and belching, radiating to bilateral jaw and back. Second troponin 1.01, patient was placed on IV nitro drip, heparin drip and cardiology was consulted.   Hospital Course:   Unstable angina with NSTEMI, known history of CAD with CABG in 1997 with recurrent admissions due to this - Second Troponin 1.01, patient was started on IV nitroglycerin infusion and IV heparin drip. - 2-D echo 3/1 had shown EF 50-55% - Continue aspirin, Lipitor, Lopressor, Imdur, Plavix - Cardiology was consulted and patient underwent cardiac cath. - Per cardiology: New Ramus stent.   There is severe three-vessel coronary artery disease including a diffusely diseased LAD, occluded RCA and heavily diseased circumflex.  Ramus lesion, 90% stenosed. Post intervention with a 2.25 x 14 resolute drug-eluting stent, postdilated to 2.3 mm, there is a 0% residual stenosis.  Patent LIMA to LAD. Vein grafts to RCA, circumflex and diagonal are all occluded. Continue dual antiplatelet therapy. She did have typical angina symptoms during balloon inflations.  Insulin-dependent type II DM - Uses Lantus 24 units qHS, Humalog sliding-scale, metformin, and glipizide at home  - A1c was 7.7% on 07/06/15, repeat hemoglobin A1c on 3/29  was 6.9  Hypertension urgency, BP 197/88 at the time of admission - Markedly elevated on arrival, improved with Lopressor - Continue PO Lopressor BID, Imdur  - Currently BP stable, patient will need ACE/ARB at the follow-up appointment assessment  Hyperlipidemia  -Patient placed on Lipitor 80 mg     Seizure disorder  - Continue Keppra, which was increased during recent admission for a breakthrough seizure   Day of Discharge BP 128/50 mmHg  Pulse 65  Temp(Src) 97.9 F (36.6 C) (Oral)  Resp 17  Ht 5\' 3"  (1.6 m)  Wt 68.6 kg (151 lb 3.8 oz)  BMI 26.80 kg/m2  SpO2 97%  Physical Exam: General: Alert and awake oriented x3 not in any acute distress. HEENT: anicteric sclera, pupils reactive to light and accommodation CVS: S1-S2 clear no murmur rubs or gallops Chest: clear to auscultation bilaterally, no wheezing rales or rhonchi Abdomen: soft nontender, nondistended, normal bowel sounds Extremities: no cyanosis, clubbing or edema noted bilaterally Neuro: Cranial nerves II-XII intact, no focal neurological deficits   The results of significant diagnostics from this hospitalization (including imaging, microbiology, ancillary and laboratory) are listed below for reference.    LAB RESULTS: Basic Metabolic Panel:  Recent Labs Lab 11/21/15 0325 11/22/15 0500  NA 139 137  K 4.3 4.1  CL 104 107  CO2 22 23  GLUCOSE 121* 142*  BUN 13 12  CREATININE 0.85 0.79  CALCIUM 9.2 8.7*   Liver Function Tests: No results for input(s): AST, ALT, ALKPHOS, BILITOT, PROT, ALBUMIN in the last 168 hours. No results for input(s): LIPASE, AMYLASE in the last 168 hours. No results for input(s): AMMONIA in the last 168 hours. CBC:  Recent Labs Lab 11/21/15 0755 11/22/15 0500  WBC 5.4 6.4  HGB 10.5* 10.7*  HCT 33.5* 34.6*  MCV 89.6 89.9  PLT 165 183   Cardiac Enzymes:  Recent Labs Lab 11/21/15 0330 11/21/15 0755  TROPONINI 1.01* 0.75*   BNP: Invalid input(s):  POCBNP CBG:  Recent Labs Lab 11/21/15 1958 11/22/15 0607  GLUCAP 140* 132*    Significant Diagnostic Studies:  Dg Chest 2 View  11/20/2015  CLINICAL DATA:  Chest pain for 1 day EXAM: CHEST  2 VIEW COMPARISON:  July 18, 2015 FINDINGS: There is no edema or consolidation. Heart remains upper normal in size with pulmonary vascular within normal limits. Patient is status post coronary artery bypass grafting. No evidence of adenopathy. No bone lesions. No pneumothorax apparent. IMPRESSION: No edema or consolidation.  Stable cardiac silhouette. Electronically Signed   By: Bretta BangWilliam  Woodruff III M.D.   On: 11/20/2015 20:47     Procedures    Coronary Stent Intervention   Left Heart Cath and Coronary Angiography    Conclusion     There is severe three-vessel coronary artery disease including a diffusely diseased LAD, occluded RCA and heavily diseased circumflex.  Ramus lesion, 90% stenosed. Post intervention with a 2.25 x 14 resolute drug-eluting stent, postdilated to 2.3 mm, there is a 0% residual stenosis.  Patent LIMA to LAD. Vein grafts to RCA, circumflex and diagonal are all occluded.  Continue dual antiplatelet therapy. She did have typical angina symptoms during balloon inflations. Hopefully, she now has better flow to the lateral wall and better flow to collaterals which originate from the ramus.  She does have laser eye surgery coming up. Hopefully, this can be done with the patient on clopidogrel. As long as she stays on the clopidogrel, aspirin could be stopped if necessary.  Continue aggressive secondary prevention as well.     Disposition and Follow-up: Discharge Instructions    Amb Referral to Cardiac Rehabilitation    Complete by:  As directed   Diagnosis:   Myocardial Infarction Comment - does not want to do again. stated has done in the past PCI       Diet - low sodium heart healthy    Complete by:  As directed      Diet Carb Modified    Complete by:  As  directed      Increase activity slowly    Complete by:  As directed             DISPOSITION:  Home    DISCHARGE FOLLOW-UP Follow-up Information    Follow up with SPARKS,JEFFREY D, MD. Schedule an appointment as soon as possible for a visit in 2 weeks.   Specialty:  Internal Medicine   Why:  for hospital follow-up   Contact information:   [address] [facility] [place] [address] [phone]       Follow up with Lamar BlinksKOWALSKI,BRUCE J, MD. Schedule an appointment as soon as possible for a visit in 2 weeks.   Specialty:  Internal Medicine   Why:  for hospital follow-up   Contact information:   [address] [facility] [place] [place] [address] [phone]  Time spent on Discharge:   Signed:   Lyanne Kates M.D. [facility] 11/22/2015, 1:28 PM Pager: [phone]

## 2015-11-22 NOTE — Progress Notes (Signed)
Pt noted BP=74/32; (manually=80/40) ;HR=65. Pt is asymptomatic. Dr. Thurnell Garbeiccotto informed. NS 600 ml bolus given as ordered. Presently BP=100/38; HR=61. Will continue to monitor.

## 2015-11-22 NOTE — Care Management Important Message (Signed)
Important Message  Patient Details  Name: Leah FishmanDonnie R Small MRN: [id number] Date of Birth: 08/03/1941   Medicare Important Message Given:  Yes    [name] 11/22/2015, 12:21 PM

## 2015-11-22 NOTE — Progress Notes (Signed)
CARDIAC REHAB PHASE I   PRE:  Rate/Rhythm: 77 SR  BP:  Supine:   Sitting: 128/50  Standing:    SaO2:   MODE:  Ambulation: 325 ft   POST:  Rate/Rhythm: 78 SR  BP:  Supine:   Sitting: 122/38  Standing:    SaO2:  0850-0943 Pt walked 325 ft with asst x 1 holding to side rail for support. Tolerated well. Has not walked that far in a while. Encouraged pt to walk several times a day going a little farther each day. Stressed importance of taking plavix with stent. Discussed healthy eating habits as pt had not been eating much. Her A1c is the best it has been at 6.9. Pt stated she has attended CRP 2 before but does not want to attend again. [facility]. Discussed NTG use. Pt very receptive to ed.   Leah Nuttingharlene Alanni Vader, RN BSN  11/22/2015 9:39 AM

## 2016-01-28 ENCOUNTER — Inpatient Hospital Stay (HOSPITAL_COMMUNITY)
Admission: EM | Admit: 2016-01-28 | Discharge: 2016-01-30 | DRG: 282 | Disposition: A | Discharge status: Home / Self Care | Payer: Medicare Other | Attending: Cardiology | Admitting: Cardiology

## 2016-01-28 ENCOUNTER — Emergency Department (HOSPITAL_COMMUNITY): Payer: Medicare Other

## 2016-01-28 ENCOUNTER — Encounter (HOSPITAL_COMMUNITY): Payer: Self-pay | Admitting: Emergency Medicine

## 2016-01-28 DIAGNOSIS — Z823 Family history of stroke: Secondary | ICD-10-CM | POA: Diagnosis not present

## 2016-01-28 DIAGNOSIS — Z888 Allergy status to other drugs, medicaments and biological substances status: Secondary | ICD-10-CM

## 2016-01-28 DIAGNOSIS — I160 Hypertensive urgency: Secondary | ICD-10-CM | POA: Diagnosis present

## 2016-01-28 DIAGNOSIS — I200 Unstable angina: Secondary | ICD-10-CM | POA: Diagnosis present

## 2016-01-28 DIAGNOSIS — Z885 Allergy status to narcotic agent status: Secondary | ICD-10-CM | POA: Diagnosis not present

## 2016-01-28 DIAGNOSIS — Z8673 Personal history of transient ischemic attack (TIA), and cerebral infarction without residual deficits: Secondary | ICD-10-CM | POA: Diagnosis not present

## 2016-01-28 DIAGNOSIS — I2510 Atherosclerotic heart disease of native coronary artery without angina pectoris: Secondary | ICD-10-CM | POA: Diagnosis present

## 2016-01-28 DIAGNOSIS — Z79899 Other long term (current) drug therapy: Secondary | ICD-10-CM

## 2016-01-28 DIAGNOSIS — I25110 Atherosclerotic heart disease of native coronary artery with unstable angina pectoris: Secondary | ICD-10-CM | POA: Diagnosis present

## 2016-01-28 DIAGNOSIS — Z955 Presence of coronary angioplasty implant and graft: Secondary | ICD-10-CM

## 2016-01-28 DIAGNOSIS — E119 Type 2 diabetes mellitus without complications: Secondary | ICD-10-CM | POA: Diagnosis present

## 2016-01-28 DIAGNOSIS — Z8742 Personal history of other diseases of the female genital tract: Secondary | ICD-10-CM

## 2016-01-28 DIAGNOSIS — Z794 Long term (current) use of insulin: Secondary | ICD-10-CM

## 2016-01-28 DIAGNOSIS — E7800 Pure hypercholesterolemia, unspecified: Secondary | ICD-10-CM | POA: Diagnosis present

## 2016-01-28 DIAGNOSIS — D649 Anemia, unspecified: Secondary | ICD-10-CM | POA: Diagnosis present

## 2016-01-28 DIAGNOSIS — E118 Type 2 diabetes mellitus with unspecified complications: Secondary | ICD-10-CM | POA: Diagnosis present

## 2016-01-28 DIAGNOSIS — Z7902 Long term (current) use of antithrombotics/antiplatelets: Secondary | ICD-10-CM | POA: Diagnosis not present

## 2016-01-28 DIAGNOSIS — I214 Non-ST elevation (NSTEMI) myocardial infarction: Secondary | ICD-10-CM | POA: Diagnosis present

## 2016-01-28 DIAGNOSIS — I252 Old myocardial infarction: Secondary | ICD-10-CM | POA: Diagnosis not present

## 2016-01-28 DIAGNOSIS — I119 Hypertensive heart disease without heart failure: Secondary | ICD-10-CM | POA: Diagnosis present

## 2016-01-28 DIAGNOSIS — Z8249 Family history of ischemic heart disease and other diseases of the circulatory system: Secondary | ICD-10-CM | POA: Diagnosis not present

## 2016-01-28 DIAGNOSIS — K219 Gastro-esophageal reflux disease without esophagitis: Secondary | ICD-10-CM | POA: Diagnosis present

## 2016-01-28 DIAGNOSIS — Z7982 Long term (current) use of aspirin: Secondary | ICD-10-CM

## 2016-01-28 DIAGNOSIS — L4050 Arthropathic psoriasis, unspecified: Secondary | ICD-10-CM | POA: Diagnosis present

## 2016-01-28 DIAGNOSIS — E785 Hyperlipidemia, unspecified: Secondary | ICD-10-CM | POA: Diagnosis present

## 2016-01-28 LAB — BASIC METABOLIC PANEL
Anion gap: 9 (ref 5–15)
BUN: 18 mg/dL (ref 6–20)
CO2: 24 mmol/L (ref 22–32)
CREATININE: 0.75 mg/dL (ref 0.44–1.00)
Calcium: 9.6 mg/dL (ref 8.9–10.3)
Chloride: 103 mmol/L (ref 101–111)
GFR calc Af Amer: >60 mL/min (ref >60)
GLUCOSE: 228 mg/dL — AB (ref 65–99)
POTASSIUM: 4.7 mmol/L (ref 3.5–5.1)
SODIUM: 136 mmol/L (ref 135–145)

## 2016-01-28 LAB — CBC
HEMATOCRIT: 36.6 % (ref 36.0–46.0)
Hemoglobin: 11.3 g/dL — ABNORMAL LOW (ref 12.0–15.0)
MCH: 27.4 pg (ref 26.0–34.0)
MCHC: 30.9 g/dL (ref 30.0–36.0)
MCV: 88.8 fL (ref 78.0–100.0)
PLATELETS: 238 10*3/uL (ref 150–400)
RBC: 4.12 MIL/uL (ref 3.87–5.11)
RDW: 14.5 % (ref 11.5–15.5)
WBC: 6.6 10*3/uL (ref 4.0–10.5)

## 2016-01-28 LAB — I-STAT TROPONIN, ED: Troponin i, poc: 0.01 ng/mL (ref 0.00–0.08)

## 2016-01-28 MED ORDER — METOPROLOL TARTRATE 12.5 MG HALF TABLET
12.5000 mg | ORAL_TABLET | Freq: Two times a day (BID) | ORAL | Status: DC
  Administered 2016-01-29 (×3): 12.5 mg via ORAL
  Filled 2016-01-28 (×4): qty 1

## 2016-01-28 MED ORDER — NITROGLYCERIN 0.4 MG SL SUBL: 0.4000 mg | SUBLINGUAL_TABLET | SUBLINGUAL | Status: DC | PRN

## 2016-01-28 MED ORDER — HEPARIN (PORCINE) IN NACL 100-0.45 UNIT/ML-% IJ SOLN
1100.0000 [IU]/h | INTRAMUSCULAR | Status: DC
  Administered 2016-01-28: 1000 [IU]/h via INTRAVENOUS
  Filled 2016-01-28: qty 250

## 2016-01-28 MED ORDER — NITROGLYCERIN IN D5W 200-5 MCG/ML-% IV SOLN
0.0000 ug/min | Freq: Once | INTRAVENOUS | Status: AC
  Administered 2016-01-28: 10 ug/min via INTRAVENOUS
  Filled 2016-01-28: qty 250

## 2016-01-28 MED ORDER — HEPARIN BOLUS VIA INFUSION
4000.0000 [IU] | Freq: Once | INTRAVENOUS | Status: AC
  Administered 2016-01-28: 4000 [IU] via INTRAVENOUS
  Filled 2016-01-28: qty 4000

## 2016-01-28 NOTE — H&P (Signed)
Admit date: 01/28/2016 Referring Physician: Dr. Preston Fleeting Primary Cardiologist: Dr. Elio Forget. Leah Small Chief complaint/reason for admission:Chest pain  HPI: This is a 75yo WF with a history of Type II DM, hyperlipidemia, HTN, GERD and ASCAD s/p remote CABG.  She usually follows with Dr. Lesia Small in [place] but refuses to go to [facility].  She presented to [facility] in March with CP and ruled in for NSTEMI.  At that time her CP was left sided with bilateral jaw and back pain.  She underwent cath showing severe 3 vessel ASCAD with diffusely diseased LAD, occluded RCA and heavily diseased circumflex.  There was a Ramus lesion, 90% stenosed, Patent LIMA to LAD and Vein grafts to RCA, circumflex and diagonal were all occluded.  She underwent PCI of the ramus.  Of note her BP was markedly elevated on admission at 197/61mmHg.  She was placed on high dose statin.  She was discharged home to follow with Dr. Lesia Small.  She did fine until this past Sunday when she started having sharp and pressure pain on the right side of her chest radiating into her jaw.  This afternoon around 4pm she developed sudden onset of severe sharp right sided CP that then became a pressure with radiation into her jaw.  She took a SL NTG without any relief.  She started to belch some but again did not get any relief.  She took another SL NTG and got some relief and went to put on her pajamas.  She checkec her BS and it was in the 170's.  She then developed recurrent pain and took her BP and is was 190/29mmHg.  She called EMS and took a 3rd SL NTG with no relief.  She started having severe diaphoresis, nausea and vomiting and was given zofran, ASA, nitropaste, fentanyl and IV enalapril.  She is currently pain free.      PMH:    Past Medical History  Diagnosis Date  . Coronary artery disease   . Acid reflux   . Hypertension   . Hypercholesterolemia   . Chronic stable angina (HCC)   . Type II diabetes mellitus (HCC)   .  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 TIA (transient ischemic attack)     "several over the last 20 years" (10/22/2015)  . Arthritis     "all over"  . Psoriatic arthritis (HCC)   . Myocardial infarction (HCC) 1991; 07/06/2015  . NSTEMI (non-ST elevated myocardial infarction) (HCC) 10/22/2015    PSH:    Past Surgical History  Procedure Laterality Date  . Tonsillectomy  ~ 1947    2nd grade  . Appendectomy  1950s    elementary school  . Cholecystectomy open  ~ 2014  . Abdominal hysterectomy  1973  . Dilation and curettage of uterus  X 1  . 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 Carotid stent insertion Bilateral 2011-2012    right-left  . Cataract extraction w/ intraocular lens  implant, bilateral Bilateral 2009-2010  . 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 Cardiac catheterization  X 2-3  . Coronary angioplasty    . Coronary angioplasty with stent placement  11/21/2015  . Cardiac catheterization N/A 11/21/2015    Procedure: Left Heart Cath and Coronary Angiography;  Surgeon: Corky Crafts, MD;  Location: [facility] INVASIVE CV LAB;  Service: Cardiovascular;  Laterality: N/A;  . Cardiac  catheterization  11/21/2015    Procedure: Coronary Stent Intervention;  Surgeon: Corky Crafts, MD;  Location: [facility] INVASIVE CV LAB;  Service: Cardiovascular;;    ALLERGIES:   Vicodin; Talwin; and Valium  Prior to Admit Meds:   (Not in a hospital admission) Family HX:    Family History  Problem Relation Age of Onset  . Heart attack Father   . Diabetes Mother   . Cancer Mother   . Stroke Mother    Social HX:    Social History   Social History  . Marital Status: Widowed    Spouse Name: N/A  . Number of Children: N/A  . Years of Education: N/A   Occupational History  . Not on file.   Social History Main Topics  . Smoking status: Never Smoker   . Smokeless tobacco:  Never Used  . Alcohol Use: No  . Drug Use: No  . Sexual Activity: No   Other Topics Concern  . Not on file   Social History Narrative     ROS:  All 11 ROS were addressed and are negative except what is stated in the HPI  PHYSICAL EXAM Filed Vitals:   01/28/16 2100 01/28/16 2200  BP: 103/45 108/53  Pulse: 55 64  Temp:    Resp: 15 17   General: Well developed, well nourished, in no acute distress Head: Eyes PERRLA, No xanthomas.   Normal cephalic and atramatic  Lungs:   Clear bilaterally to auscultation and percussion. Heart:   HRRR S1 S2 Pulses are 2+ & equal.            No carotid bruit. No JVD.  No abdominal bruits. No femoral bruits. Abdomen: Bowel sounds are positive, abdomen soft and non-tender without masses Msk:  Back normal, normal gait. Normal strength and tone for age. Extremities:   No clubbing, cyanosis or edema.  DP +1 Neuro: Alert and oriented X 3. Psych:  Good affect, responds appropriately   Labs:   Lab Results  Component Value Date   WBC 6.6 01/28/2016   HGB 11.3* 01/28/2016   HCT 36.6 01/28/2016   MCV 88.8 01/28/2016   PLT 238 01/28/2016    Recent Labs Lab 01/28/16 1838  NA 136  K 4.7  CL 103  CO2 24  BUN 18  CREATININE 0.75  CALCIUM 9.6  GLUCOSE 228*   Lab Results  Component Value Date   CKTOTAL 91 12/14/2014   CKMB 4.6 12/14/2014   TROPONINI 0.75* 11/21/2015   No results found for: PTT Lab Results  Component Value Date   INR 1.19 11/21/2015   INR 1.06 07/06/2015   INR 0.9 09/16/2011     Lab Results  Component Value Date   CHOL 156 11/22/2015   CHOL 158 07/07/2015   Lab Results  Component Value Date   HDL 44 11/22/2015   HDL 43 07/07/2015   Lab Results  Component Value Date   LDLCALC 80 11/22/2015   LDLCALC 82 07/07/2015   Lab Results  Component Value Date   TRIG 161* 11/22/2015   TRIG 167* 07/07/2015   Lab Results  Component Value Date   CHOLHDL 3.5 11/22/2015   CHOLHDL 3.7 07/07/2015   No results found  for: LDLDIRECT    Radiology:  Dg Chest 2 View  01/28/2016  CLINICAL DATA:  75 year old female with chest pain EXAM: CHEST  2 VIEW COMPARISON:  Chest radiograph dated 11/20/2015 FINDINGS: Two views of the chest do not demonstrate a focal consolidation. There is  no pleural effusion or pneumothorax. The cardiac silhouette is within normal limits. Median sternotomy wires and CABG vascular clips noted. There is osteopenia with degenerative changes of spine. No acute fracture. IMPRESSION: No active cardiopulmonary disease. Electronically Signed   By: Elgie CollardArash  Radparvar M.D.   On: 01/28/2016 19:24    EKG:  NSR with 1mm of horizontal ST segment depression in the inferolateral leads.  ASSESSMENT/PLAN:   1.  Unstable angina - patient has had several episodes of sharp and pressure pain radiating into her right jaw over the past week and now culminating in a severe episode tonight associated with nausea, diaphoresis and vomiting.  Her CP is similar in quality to her presentation in March but on the right instead of the left side.  She does have a history of GERD but belching did not improve her pain.  On arrival in ER her BP was markedly elevated similar to her admission in March.  ? Whether her symptoms were related to hypertensive urgency with subendocardial ischemia from increased LVEDP from severely elevated BP or due to restenosis of her ramus stent.  SHe is within the 6 month window for restenosis.  Will make NPO after MN,  Continue IV Heparin and NTG gtt.  Continue ASA/plavix/statin/BB.  Possible cath in am.  Cycle troponin. 2.  ASCAD with remote CABG and recent cath 3 months ago with all occluded grafts except LIMA to LAD s/p PCI of ramus on DAPT.  Continue ASA/Plavix/BB/statin. 3.  Hyperlipidemia - continue statin. Check FLP 4.  Type II DM - continue Glipizide but hold Metformin pending possible cath.  Cover with SS Insulin. 5.  Hypertensive urgency - continue IV NTG gtt.  BP well controlled.  ? Whether this  was due to severe chest pain with increased catecholamines.  She is only taking metoprolol tartrate 12.5mg  once daily at bedtime.  Will increase to BID.  6.  Vaginal bleeding - she says that she had vaginal bleeding for 30 minutes about a weeks ago.  She had a remote TAH in the past.  Will need followup with GYN.  Armanda Magicraci Adean Milosevic, MD  01/28/2016  10:49 PM

## 2016-01-28 NOTE — ED Notes (Signed)
MD at bedside. 

## 2016-01-28 NOTE — Progress Notes (Signed)
ANTICOAGULATION CONSULT NOTE - Initial Consult  Pharmacy Consult for heparin Indication: chest pain/ACS  Allergies  Allergen Reactions  . Vicodin [Hydrocodone-Acetaminophen] Nausea And Vomiting  . Talwin [Pentazocine]   . Valium [Diazepam] Other (See Comments)    Makes her feel like she's flying    Patient Measurements:   Heparin Dosing Weight:   Vital Signs: Temp: 97.6 F (36.4 C) (06/06 1835) Temp Source: Oral (06/06 1835) BP: 147/97 mmHg (06/06 1845) Pulse Rate: 73 (06/06 1845)  Labs: No results for input(s): HGB, HCT, PLT, APTT, LABPROT, INR, HEPARINUNFRC, HEPRLOWMOCWT, CREATININE, CKTOTAL, CKMB, TROPONINI in the last 72 hours.  CrCl cannot be calculated (Unknown ideal weight.).   Medical History: Past Medical History  Diagnosis Date  . Coronary artery disease   . Acid reflux   . Hypertension   . Hypercholesterolemia   . Chronic stable angina (HCC)   . Type II diabetes mellitus (HCC)   . 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 TIA (transient ischemic attack)     "several over the last 20 years" (10/22/2015)  . Arthritis     "all over"  . Psoriatic arthritis (HCC)   . Myocardial infarction (HCC) 1991; 07/06/2015  . NSTEMI (non-ST elevated myocardial infarction) (HCC) 10/22/2015    Medications:  Infusions:  . heparin      Assessment: 74 yof presented to the ED with CP. To start IV heparin. Labs pending. She is not on oral anticoagulation PTA.   Goal of Therapy:  Heparin level 0.3-0.7 units/ml Monitor platelets by anticoagulation protocol: Yes   Plan:  - Heparin bolus 4000 units IV x 1 - Heparin gtt 1000 units/hr - Check an 8 hour heparin level - Daily heparin level and CBC  Leah Small, Leah Small 01/28/2016,7:19 PM

## 2016-01-28 NOTE — ED Notes (Signed)
Pt transported to xray 

## 2016-01-28 NOTE — ED Notes (Signed)
Pt had a sudden onset of right sided chest pain going into right shoulder and back. Pt also diaphoretic and vomiting on arrival.pt appears pale. Per ems pt was also hypertensive in route. Ems gave 324 ASA, 4mg  zofran, 100mcg fentanyl, 1inch of nitro paste applied to left upper chest, and 2.5mg  of enalapril given IV.

## 2016-01-28 NOTE — ED Provider Notes (Signed)
CSN: [id number]     Arrival date & time 01/28/16  1829 History   First MD Initiated Contact with Patient 01/28/16 1835     Chief Complaint  Patient presents with  . Chest Pain    Patient is a 75 y.o. female presenting with chest pain.  Chest Pain Pain location:  R chest Pain quality: pressure   Pain radiates to:  R shoulder Pain radiates to the back: no   Pain severity:  Severe Onset quality:  Sudden Timing:  Constant Progression:  Partially resolved Chronicity:  Recurrent Context: at rest   Context: not breathing, no drug use, not eating, no intercourse, not lifting, no movement, not raising an arm, no stress and no trauma   Relieved by:  Nitroglycerin Worsened by:  Nothing tried Ineffective treatments:  None tried Associated symptoms: nausea and shortness of breath   Associated symptoms: no abdominal pain, no altered mental status, no anxiety, no back pain, no cough, no fever, no heartburn, no numbness and no palpitations   Risk factors: coronary artery disease      Past Medical History  Diagnosis Date  . Coronary artery disease   . Acid reflux   . Hypertension   . Hypercholesterolemia   . Chronic stable angina (HCC)   . Type II diabetes mellitus (HCC)   . 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 TIA (transient ischemic attack)     "several over the last 20 years" (10/22/2015)  . Arthritis     "all over"  . Psoriatic arthritis (HCC)   . Myocardial infarction (HCC) 1991; 07/06/2015  . NSTEMI (non-ST elevated myocardial infarction) (HCC) 10/22/2015   Past Surgical History  Procedure Laterality Date  . Tonsillectomy  ~ 1947    2nd grade  . Appendectomy  1950s    elementary school  . Cholecystectomy open  ~ 2014  . Abdominal hysterectomy  1973  . Dilation and curettage of uterus  X 1  . 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 Carotid stent insertion Bilateral 2011-2012    right-left  . Cataract  extraction w/ intraocular lens  implant, bilateral Bilateral 2009-2010  . 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 Cardiac catheterization  X 2-3  . Coronary angioplasty    . Coronary angioplasty with stent placement  11/21/2015  . Cardiac catheterization N/A 11/21/2015    Procedure: Left Heart Cath and Coronary Angiography;  Surgeon: Corky CraftsJayadeep S Varanasi, MD;  [facility];  Service: Cardiovascular;  Laterality: N/A;  . Cardiac catheterization  11/21/2015    Procedure: Coronary Stent Intervention;  Surgeon: Corky CraftsJayadeep S Varanasi, MD;  [facility];  Service: Cardiovascular;;   Family History  Problem Relation Age of Onset  . Heart attack Father   . Diabetes Mother   . Cancer Mother   . Stroke Mother    Social History  Substance Use Topics  . Smoking status: Never Smoker   . Smokeless tobacco: Never Used  . Alcohol Use: No   OB History    No data available     Review of Systems  Constitutional: Negative for fever.  Respiratory: Positive for shortness of breath. Negative for cough.   Cardiovascular: Positive for chest pain. Negative for palpitations.  Gastrointestinal: Positive for nausea. Negative for heartburn and abdominal pain.  Musculoskeletal: Negative for back pain.  Neurological: Negative for numbness.  All other systems reviewed and are negative.     Allergies  Vicodin; Talwin; and Valium  Home Medications   Prior to Admission medications   Medication Sig Start Date End Date Taking? Authorizing Provider  aspirin EC 81 MG tablet Take 81 mg by mouth at bedtime.   Yes Historical Provider, MD  atorvastatin (LIPITOR) 80 MG tablet Take 1 tablet (80 mg total) by mouth at bedtime. 11/22/15  Yes Ripudeep K Rai, MD  bimatoprost (LUMIGAN) 0.01 % SOLN Place 1 drop into both eyes at bedtime.   Yes Historical Provider, MD  brimonidine (ALPHAGAN) 0.2 % ophthalmic solution Place 1 drop into both  eyes 2 (two) times daily.    Yes Historical Provider, MD  CALCIUM-MAGNESIUM-ZINC PO Take 1 tablet by mouth daily.   Yes Historical Provider, MD  clopidogrel (PLAVIX) 75 MG tablet Take 75 mg by mouth daily.   Yes Historical Provider, MD  glipiZIDE (GLUCOTROL XL) 10 MG 24 hr tablet Take 10 mg by mouth every morning. 04/26/15  Yes Historical Provider, MD  Insulin Glargine (LANTUS SOLOSTAR) 100 UNIT/ML Solostar Pen Inject 24 Units into the skin at bedtime.   Yes Historical Provider, MD  insulin lispro (HUMALOG) 100 UNIT/ML injection Inject 6-8 Units into the skin 3 (three) times daily before meals. Per sliding scale: 6 units plus:   CBG 150-200 add 1 unit, 201-250 add 2 units, 251-300 add 3 units, 301-350 add 4 units 351-400 add 5 units, >400 add 6 units and call MD   Yes Historical Provider, MD  isosorbide mononitrate (IMDUR) 30 MG 24 hr tablet Take 1 tablet (30 mg total) by mouth daily. 11/22/15  Yes Bhavinkumar Bhagat, PA  levETIRAcetam (KEPPRA) 750 MG tablet Take 750 mg by mouth 2 (two) times daily.   Yes Historical Provider, MD  metFORMIN (GLUCOPHAGE) 1000 MG tablet Take 1,000 mg by mouth 2 (two) times daily. 04/25/15  Yes Historical Provider, MD  metoprolol tartrate (LOPRESSOR) 25 MG tablet Take 12.5 mg by mouth at bedtime.    Yes Historical Provider, MD  nitroGLYCERIN (NITROSTAT) 0.4 MG SL tablet Place 0.4 mg under the tongue every 5 (five) minutes as needed for chest pain.   Yes Historical Provider, MD  nystatin cream (MYCOSTATIN) Apply 1 application topically 2 (two) times daily as needed (yeast infection).  04/25/15  Yes Historical Provider, MD  ranitidine (ZANTAC) 150 MG tablet Take 150 mg by mouth daily.   Yes Historical Provider, MD  timolol (BETIMOL) 0.5 % ophthalmic solution Place 1 drop into both eyes 2 (two) times daily.   Yes Historical Provider, MD  triamcinolone ointment (KENALOG) 0.1 % Apply 1 application topically 3 (three) times daily as needed (itching from psoriasis).   Yes Historical  Provider, MD  vitamin B-12 (CYANOCOBALAMIN) 1000 MCG tablet Take 1,000 mcg by mouth daily.   Yes Historical Provider, MD  vitamin E 400 UNIT capsule Take 400 Units by mouth daily.   Yes Historical Provider, MD  pantoprazole (PROTONIX) 40 MG tablet Take 1 tablet (40 mg total) by mouth daily. Patient not taking: Reported on 01/28/2016 07/10/15   Richarda Overlie, MD   BP 168/68 mmHg  Pulse 76  Temp(Src) 97.6 F (36.4 C) (Oral)  Resp 13  SpO2 98% Physical Exam  Constitutional: She appears well-developed and well-nourished. No distress.  HENT:  Head: Normocephalic and atraumatic.  Eyes: Conjunctivae are normal. Right eye exhibits no discharge. Left eye exhibits no discharge.  Neck: Normal range of motion. Neck supple. No JVD present.  Cardiovascular: Normal  rate, regular rhythm, normal heart sounds and intact distal pulses.   No murmur heard. Pulmonary/Chest: Effort normal and breath sounds normal. No respiratory distress.  Abdominal: Soft. Bowel sounds are normal. She exhibits no distension and no mass. There is no tenderness. There is no rebound and no guarding.  Musculoskeletal: She exhibits no edema.  Neurological: She is alert.  Skin: Skin is warm. No rash noted.  Psychiatric: She has a normal mood and affect.  Nursing note and vitals reviewed.   ED Course  Procedures (including critical care time) Labs Review Labs Reviewed  BASIC METABOLIC PANEL - Abnormal; Notable for the following:    Glucose, Bld 228 (*)    All other components within normal limits  CBC - Abnormal; Notable for the following:    Hemoglobin 11.3 (*)    All other components within normal limits  CBC WITH DIFFERENTIAL/PLATELET - Abnormal; Notable for the following:    RBC 3.80 (*)    Hemoglobin 10.4 (*)    HCT 33.8 (*)    All other components within normal limits  APTT - Abnormal; Notable for the following:    aPTT 80 (*)    All other components within normal limits  GLUCOSE, CAPILLARY - Abnormal; Notable  for the following:    Glucose-Capillary 270 (*)    All other components within normal limits  PROTIME-INR  HEPARIN LEVEL (UNFRACTIONATED)  CBC  COMPREHENSIVE METABOLIC PANEL  MAGNESIUM  TSH  TROPONIN I  TROPONIN I  TROPONIN I  HEMOGLOBIN A1C  OCCULT BLOOD X 1 CARD TO LAB, STOOL  I-STAT TROPOININ, ED    Imaging Review Dg Chest 2 View  01/28/2016  CLINICAL DATA:  75 year old female with chest pain EXAM: CHEST  2 VIEW COMPARISON:  Chest radiograph dated 11/20/2015 FINDINGS: Two views of the chest do not demonstrate a focal consolidation. There is no pleural effusion or pneumothorax. The cardiac silhouette is within normal limits. Median sternotomy wires and CABG vascular clips noted. There is osteopenia with degenerative changes of spine. No acute fracture. IMPRESSION: No active cardiopulmonary disease. Electronically Signed   By: Elgie Collard M.D.   On: 01/28/2016 19:24   I have personally reviewed and evaluated these images and lab results as part of my medical decision-making.   EKG Interpretation   Date/Time:  Tuesday January 28 2016 18:32:05 EDT Ventricular Rate:  79 PR Interval:  137 QRS Duration: 121 QT Interval:  403 QTC Calculation: 462 R Axis:   53 Text Interpretation:  Sinus rhythm Nonspecific intraventricular conduction  delay Repol abnrm suggests ischemia, anterolateral When compared with ECG  of 11/22/2015, ST less depressed in Inferior leads and Anterolateral leads  is more prominent Confirmed by [facility]  MD, DAVID ([id number]) on 01/28/2016  6:39:57 PM      MDM   Final diagnoses:  Unstable angina (HCC)    Patient received aspirin in route. Nitroglycerin drip initiated here as patient did feel some relief. EKG with concerning ST depressions that are new when compared to prior in the anterolateral leads. Patient has significant cardiac history with multiple stents and bypasses. States symptoms are similar to prior heart attack. Will start on heparin. Cardiology  consultation. Will admit patient.    Sidney Ace, MD 01/29/16 8119  Dione Booze, MD 01/29/16 [phone]

## 2016-01-29 ENCOUNTER — Encounter (HOSPITAL_COMMUNITY)
Admission: EM | Disposition: A | Discharge status: Home / Self Care | Payer: Self-pay | Source: Home / Self Care | Attending: Cardiology

## 2016-01-29 DIAGNOSIS — E785 Hyperlipidemia, unspecified: Secondary | ICD-10-CM

## 2016-01-29 DIAGNOSIS — I2510 Atherosclerotic heart disease of native coronary artery without angina pectoris: Secondary | ICD-10-CM

## 2016-01-29 DIAGNOSIS — Z8742 Personal history of other diseases of the female genital tract: Secondary | ICD-10-CM

## 2016-01-29 DIAGNOSIS — I119 Hypertensive heart disease without heart failure: Secondary | ICD-10-CM

## 2016-01-29 DIAGNOSIS — K219 Gastro-esophageal reflux disease without esophagitis: Secondary | ICD-10-CM

## 2016-01-29 DIAGNOSIS — I160 Hypertensive urgency: Secondary | ICD-10-CM

## 2016-01-29 DIAGNOSIS — E119 Type 2 diabetes mellitus without complications: Secondary | ICD-10-CM

## 2016-01-29 DIAGNOSIS — D649 Anemia, unspecified: Secondary | ICD-10-CM

## 2016-01-29 DIAGNOSIS — I214 Non-ST elevation (NSTEMI) myocardial infarction: Principal | ICD-10-CM

## 2016-01-29 HISTORY — PX: CARDIAC CATHETERIZATION: SHX172

## 2016-01-29 LAB — GLUCOSE, CAPILLARY
GLUCOSE-CAPILLARY: 217 mg/dL — AB (ref 65–99)
GLUCOSE-CAPILLARY: 121 mg/dL — AB (ref 65–99)
GLUCOSE-CAPILLARY: 74 mg/dL (ref 65–99)
GLUCOSE-CAPILLARY: 220 mg/dL — AB (ref 65–99)
Glucose-Capillary: 270 mg/dL — ABNORMAL HIGH (ref 65–99)
Glucose-Capillary: 154 mg/dL — ABNORMAL HIGH (ref 65–99)

## 2016-01-29 LAB — COMPREHENSIVE METABOLIC PANEL
ALK PHOS: 58 U/L (ref 38–126)
ALT: 16 U/L (ref 14–54)
ANION GAP: 9 (ref 5–15)
AST: 24 U/L (ref 15–41)
Albumin: 3.7 g/dL (ref 3.5–5.0)
BILIRUBIN TOTAL: 0.6 mg/dL (ref 0.3–1.2)
BUN: 14 mg/dL (ref 6–20)
CO2: 26 mmol/L (ref 22–32)
CREATININE: 0.79 mg/dL (ref 0.44–1.00)
Calcium: 9.4 mg/dL (ref 8.9–10.3)
Chloride: 103 mmol/L (ref 101–111)
Glucose, Bld: 266 mg/dL — ABNORMAL HIGH (ref 65–99)
Potassium: 4.8 mmol/L (ref 3.5–5.1)
SODIUM: 138 mmol/L (ref 135–145)
TOTAL PROTEIN: 6.8 g/dL (ref 6.5–8.1)

## 2016-01-29 LAB — CBC WITH DIFFERENTIAL/PLATELET
Basophils Absolute: 0 10*3/uL (ref 0.0–0.1)
Basophils Relative: 0 %
Eosinophils Absolute: 0.1 10*3/uL (ref 0.0–0.7)
Eosinophils Relative: 2 %
HEMATOCRIT: 33.8 % — AB (ref 36.0–46.0)
HEMOGLOBIN: 10.4 g/dL — AB (ref 12.0–15.0)
LYMPHS ABS: 2.3 10*3/uL (ref 0.7–4.0)
LYMPHS PCT: 39 %
MCH: 27.4 pg (ref 26.0–34.0)
MCHC: 30.8 g/dL (ref 30.0–36.0)
MCV: 88.9 fL (ref 78.0–100.0)
MONOS PCT: 7 %
Monocytes Absolute: 0.4 10*3/uL (ref 0.1–1.0)
NEUTROS ABS: 2.9 10*3/uL (ref 1.7–7.7)
NEUTROS PCT: 52 %
Platelets: 188 10*3/uL (ref 150–400)
RBC: 3.8 MIL/uL — ABNORMAL LOW (ref 3.87–5.11)
RDW: 14.7 % (ref 11.5–15.5)
WBC: 5.7 10*3/uL (ref 4.0–10.5)

## 2016-01-29 LAB — CBC
HCT: 30.6 % — ABNORMAL LOW (ref 36.0–46.0)
HEMOGLOBIN: 9.5 g/dL — AB (ref 12.0–15.0)
MCH: 27.5 pg (ref 26.0–34.0)
MCHC: 31 g/dL (ref 30.0–36.0)
MCV: 88.4 fL (ref 78.0–100.0)
Platelets: 179 10*3/uL (ref 150–400)
RBC: 3.46 MIL/uL — ABNORMAL LOW (ref 3.87–5.11)
RDW: 14.7 % (ref 11.5–15.5)
WBC: 5.3 10*3/uL (ref 4.0–10.5)

## 2016-01-29 LAB — MAGNESIUM: MAGNESIUM: 1.4 mg/dL — AB (ref 1.7–2.4)

## 2016-01-29 LAB — TROPONIN I
TROPONIN I: 0.79 ng/mL — AB (ref <0.031)
Troponin I: 1.01 ng/mL (ref <0.031)
Troponin I: 0.84 ng/mL (ref <0.031)

## 2016-01-29 LAB — PROTIME-INR
INR: 1.12 (ref 0.00–1.49)
Prothrombin Time: 14.6 seconds (ref 11.6–15.2)

## 2016-01-29 LAB — HEPARIN LEVEL (UNFRACTIONATED)
HEPARIN UNFRACTIONATED: 0.55 [IU]/mL (ref 0.30–0.70)
Heparin Unfractionated: 0.28 IU/mL — ABNORMAL LOW (ref 0.30–0.70)

## 2016-01-29 LAB — APTT: aPTT: 80 seconds — ABNORMAL HIGH (ref 24–37)

## 2016-01-29 LAB — TSH: TSH: 2.214 u[IU]/mL (ref 0.350–4.500)

## 2016-01-29 SURGERY — LEFT HEART CATH AND CORS/GRAFTS ANGIOGRAPHY

## 2016-01-29 MED ORDER — ASPIRIN 300 MG RE SUPP: 300.0000 mg | RECTAL | Status: AC

## 2016-01-29 MED ORDER — NITROGLYCERIN 0.4 MG SL SUBL: 0.4000 mg | SUBLINGUAL_TABLET | SUBLINGUAL | Status: DC | PRN

## 2016-01-29 MED ORDER — HEPARIN (PORCINE) IN NACL 2-0.9 UNIT/ML-% IJ SOLN
INTRAMUSCULAR | Status: AC
  Filled 2016-01-29: qty 1000

## 2016-01-29 MED ORDER — IOPAMIDOL (ISOVUE-370) INJECTION 76%
INTRAVENOUS | Status: AC
  Filled 2016-01-29: qty 125

## 2016-01-29 MED ORDER — SODIUM CHLORIDE 0.9% FLUSH
3.0000 mL | Freq: Two times a day (BID) | INTRAVENOUS | Status: DC
  Administered 2016-01-30: 3 mL via INTRAVENOUS

## 2016-01-29 MED ORDER — VERAPAMIL HCL 2.5 MG/ML IV SOLN
INTRAVENOUS | Status: AC
  Filled 2016-01-29: qty 2

## 2016-01-29 MED ORDER — FENTANYL CITRATE (PF) 100 MCG/2ML IJ SOLN
INTRAMUSCULAR | Status: DC | PRN
  Administered 2016-01-29: 25 ug via INTRAVENOUS

## 2016-01-29 MED ORDER — GLIPIZIDE ER 10 MG PO TB24
10.0000 mg | ORAL_TABLET | Freq: Every day | ORAL | Status: DC
  Administered 2016-01-29 – 2016-01-30 (×2): 10 mg via ORAL
  Filled 2016-01-29 (×2): qty 1

## 2016-01-29 MED ORDER — HEPARIN (PORCINE) IN NACL 2-0.9 UNIT/ML-% IJ SOLN
INTRAMUSCULAR | Status: DC | PRN
  Administered 2016-01-29: 1000 mL

## 2016-01-29 MED ORDER — FENTANYL CITRATE (PF) 100 MCG/2ML IJ SOLN
INTRAMUSCULAR | Status: AC
  Filled 2016-01-29: qty 2

## 2016-01-29 MED ORDER — VERAPAMIL HCL 2.5 MG/ML IV SOLN
INTRAVENOUS | Status: DC | PRN
  Administered 2016-01-29: 10 mL via INTRA_ARTERIAL

## 2016-01-29 MED ORDER — TIMOLOL MALEATE 0.5 % OP SOLN
1.0000 [drp] | Freq: Two times a day (BID) | OPHTHALMIC | Status: DC
  Administered 2016-01-29 – 2016-01-30 (×3): 1 [drp] via OPHTHALMIC
  Filled 2016-01-29: qty 5

## 2016-01-29 MED ORDER — FAMOTIDINE 20 MG PO TABS
20.0000 mg | ORAL_TABLET | Freq: Every day | ORAL | Status: DC
  Administered 2016-01-29 – 2016-01-30 (×2): 20 mg via ORAL
  Filled 2016-01-29 (×2): qty 1

## 2016-01-29 MED ORDER — SODIUM CHLORIDE 0.9% FLUSH: 3.0000 mL | INTRAVENOUS | Status: DC | PRN

## 2016-01-29 MED ORDER — LEVETIRACETAM 750 MG PO TABS
750.0000 mg | ORAL_TABLET | Freq: Two times a day (BID) | ORAL | Status: DC
  Administered 2016-01-29 – 2016-01-30 (×3): 750 mg via ORAL
  Filled 2016-01-29 (×3): qty 1

## 2016-01-29 MED ORDER — MIDAZOLAM HCL 2 MG/2ML IJ SOLN
INTRAMUSCULAR | Status: AC
  Filled 2016-01-29: qty 2

## 2016-01-29 MED ORDER — SODIUM CHLORIDE 0.9 % IV SOLN: 250.0000 mL | INTRAVENOUS | Status: DC | PRN

## 2016-01-29 MED ORDER — ONDANSETRON HCL 4 MG/2ML IJ SOLN: 4.0000 mg | Freq: Four times a day (QID) | INTRAMUSCULAR | Status: DC | PRN

## 2016-01-29 MED ORDER — SODIUM CHLORIDE 0.9 % WEIGHT BASED INFUSION
3.0000 mL/kg/h | INTRAVENOUS | Status: AC
  Administered 2016-01-29: 3 mL/kg/h via INTRAVENOUS

## 2016-01-29 MED ORDER — SODIUM CHLORIDE 0.9 % IV SOLN: INTRAVENOUS | Status: AC

## 2016-01-29 MED ORDER — LIDOCAINE HCL (PF) 1 % IJ SOLN
INTRAMUSCULAR | Status: DC | PRN
  Administered 2016-01-29: 5 mL via SUBCUTANEOUS

## 2016-01-29 MED ORDER — HEPARIN SODIUM (PORCINE) 1000 UNIT/ML IJ SOLN
INTRAMUSCULAR | Status: DC | PRN
  Administered 2016-01-29: 4000 [IU] via INTRAVENOUS

## 2016-01-29 MED ORDER — ASPIRIN 81 MG PO CHEW
324.0000 mg | CHEWABLE_TABLET | ORAL | Status: AC
  Administered 2016-01-29: 324 mg via ORAL
  Filled 2016-01-29: qty 4

## 2016-01-29 MED ORDER — LIDOCAINE HCL (PF) 1 % IJ SOLN
INTRAMUSCULAR | Status: AC
  Filled 2016-01-29: qty 30

## 2016-01-29 MED ORDER — ASPIRIN EC 81 MG PO TBEC
81.0000 mg | DELAYED_RELEASE_TABLET | Freq: Every day | ORAL | Status: DC
  Administered 2016-01-29 – 2016-01-30 (×2): 81 mg via ORAL
  Filled 2016-01-29 (×2): qty 1

## 2016-01-29 MED ORDER — IOPAMIDOL (ISOVUE-370) INJECTION 76%
INTRAVENOUS | Status: DC | PRN
  Administered 2016-01-29: 50 mL via INTRAVENOUS

## 2016-01-29 MED ORDER — BRIMONIDINE TARTRATE 0.2 % OP SOLN
1.0000 [drp] | Freq: Two times a day (BID) | OPHTHALMIC | Status: DC
  Administered 2016-01-29 – 2016-01-30 (×4): 1 [drp] via OPHTHALMIC
  Filled 2016-01-29: qty 5

## 2016-01-29 MED ORDER — CLOPIDOGREL BISULFATE 75 MG PO TABS
75.0000 mg | ORAL_TABLET | Freq: Every day | ORAL | Status: DC
  Administered 2016-01-29 – 2016-01-30 (×2): 75 mg via ORAL
  Filled 2016-01-29 (×2): qty 1

## 2016-01-29 MED ORDER — ATORVASTATIN CALCIUM 80 MG PO TABS
80.0000 mg | ORAL_TABLET | Freq: Every day | ORAL | Status: DC
  Administered 2016-01-29: 80 mg via ORAL
  Filled 2016-01-29 (×2): qty 1

## 2016-01-29 MED ORDER — LATANOPROST 0.005 % OP SOLN
1.0000 [drp] | Freq: Every day | OPHTHALMIC | Status: DC
  Administered 2016-01-29: 1 [drp] via OPHTHALMIC
  Filled 2016-01-29: qty 2.5

## 2016-01-29 MED ORDER — ASPIRIN EC 81 MG PO TBEC: 81.0000 mg | DELAYED_RELEASE_TABLET | Freq: Every day | ORAL | Status: DC

## 2016-01-29 MED ORDER — SODIUM CHLORIDE 0.9 % WEIGHT BASED INFUSION
1.0000 mL/kg/h | INTRAVENOUS | Status: DC
  Administered 2016-01-29: 1 mL/kg/h via INTRAVENOUS

## 2016-01-29 MED ORDER — MIDAZOLAM HCL 2 MG/2ML IJ SOLN
INTRAMUSCULAR | Status: DC | PRN
  Administered 2016-01-29: 1 mg via INTRAVENOUS

## 2016-01-29 MED ORDER — INSULIN GLARGINE 100 UNIT/ML ~~LOC~~ SOLN
24.0000 [IU] | Freq: Every day | SUBCUTANEOUS | Status: DC
  Administered 2016-01-29 (×2): 24 [IU] via SUBCUTANEOUS
  Filled 2016-01-29 (×3): qty 0.24

## 2016-01-29 SURGICAL SUPPLY — 10 items
CATH INFINITI 5FR MULTPACK ANG (CATHETERS) ×3 IMPLANT
DEVICE RAD COMP TR BAND LRG (VASCULAR PRODUCTS) ×3 IMPLANT
GLIDESHEATH SLEND SS 6F .021 (SHEATH) ×3 IMPLANT
KIT HEART LEFT (KITS) ×3 IMPLANT
PACK CARDIAC CATHETERIZATION (CUSTOM PROCEDURE TRAY) ×3 IMPLANT
SYR MEDRAD MARK V 150ML (SYRINGE) ×3 IMPLANT
TRANSDUCER W/STOPCOCK (MISCELLANEOUS) ×3 IMPLANT
TUBING CIL FLEX 10 FLL-RA (TUBING) ×3 IMPLANT
WIRE HI TORQ VERSACORE-J 145CM (WIRE) ×3 IMPLANT
WIRE SAFE-T 1.5MM-J .035X260CM (WIRE) ×3 IMPLANT

## 2016-01-29 NOTE — Progress Notes (Signed)
ANTICOAGULATION CONSULT NOTE - Follow Up Consult  Pharmacy Consult for heparin Indication: USAP  Labs:  Recent Labs  01/28/16 1838 01/29/16 0110 01/29/16 0330  HGB 11.3* 10.4* 9.5*  HCT 36.6 33.8* 30.6*  PLT 238 188 179  APTT  --  80*  --   LABPROT  --  14.6  --   INR  --  1.12  --   HEPARINUNFRC  --   --  0.28*  CREATININE 0.75 0.79  --   TROPONINI  --  0.79*  --     Assessment: 74yo female subtherapeutic on heparin with initial dosing for CP.  Goal of Therapy:  Heparin level 0.3-0.7 units/ml   Plan:  Will increase heparin gtt slightly to 1100 units/hr and check level in 8hr.  Leah GamblesVeronda Edris Friedt, PharmD, BCPS  01/29/2016,4:18 AM

## 2016-01-29 NOTE — Research (Signed)
[facility] [facility] Research Study Informed Consent   Subject Name: Leah Small  Subject met inclusion and exclusion criteria.  The informed consent form, study requirements and expectations were reviewed with the subject and questions and concerns were addressed prior to the signing of the consent form.  The subject verbalized understanding of the trial requirements.  The subject agreed to participate in the trial and signed the informed consent.  The informed consent was obtained prior to performance of any protocol-specific procedures for the subject.  A copy of the signed informed consent was given to the subject and a copy was placed in the subject's medical record.  Blossom Hoops 01/29/2016, 3:32 PM

## 2016-01-29 NOTE — H&P (View-Only) (Signed)
 Patient Name: Leah Small Date of Encounter: 01/29/2016  Hospital Problem List     Principal Problem:   Non-STEMI (non-ST elevated myocardial infarction) (HCC) Active Problems:   Coronary atherosclerosis of native coronary artery   Hypertensive urgency   Diabetes mellitus with complication (HCC)   Hypertensive heart disease   Normocytic anemia   HLD (hyperlipidemia)   History of vaginal bleeding   GERD (gastroesophageal reflux disease)   Unstable angina (HCC)    Subjective   Overall feeling better but cont to c/o 3/10 - dull chest ache.  No dyspnea.  Troponin now up to 1.01.  Inpatient Medications    . aspirin EC  81 mg Oral Daily  . atorvastatin  80 mg Oral QHS  . brimonidine  1 drop Both Eyes BID  . clopidogrel  75 mg Oral Daily  . famotidine  20 mg Oral Daily  . glipiZIDE  10 mg Oral Q breakfast  . insulin glargine  24 Units Subcutaneous QHS  . latanoprost  1 drop Both Eyes QHS  . levETIRAcetam  750 mg Oral BID  . metoprolol tartrate  12.5 mg Oral BID  . timolol  1 drop Both Eyes BID    Vital Signs    Filed Vitals:   01/29/16 0535 01/29/16 0600 01/29/16 0715 01/29/16 0833  BP: 84/38 96/51 101/52   Pulse:   56 60  Temp:   97.7 F (36.5 C)   TempSrc:      Resp:      Height:      Weight:      SpO2:   94%     Intake/Output Summary (Last 24 hours) at 01/29/16 0921 Last data filed at 01/29/16 0632  Gross per 24 hour  Intake 120.88 ml  Output      0 ml  Net 120.88 ml   Filed Weights   01/29/16 0010  Weight: 146 lb 1.6 oz (66.271 kg)    Physical Exam    General: Pleasant, NAD. Neuro: Alert and oriented X 3. Moves all extremities spontaneously. Psych: Normal affect. HEENT:  Normal  Neck: Supple without bruits or JVD. Lungs:  Resp regular and unlabored, CTA. Heart: RRR no s3, s4, or murmurs. Abdomen: Soft, non-tender, non-distended, BS + x 4.  Extremities: No clubbing, cyanosis or edema. DP/PT/Radials 1+ and equal bilaterally.  Labs     CBC  Recent Labs  01/29/16 0110 01/29/16 0330  WBC 5.7 5.3  NEUTROABS 2.9  --   HGB 10.4* 9.5*  HCT 33.8* 30.6*  MCV 88.9 88.4  PLT 188 179   Basic Metabolic Panel  Recent Labs  01/28/16 1838 01/29/16 0110  NA 136 138  K 4.7 4.8  CL 103 103  CO2 24 26  GLUCOSE 228* 266*  BUN 18 14  CREATININE 0.75 0.79  CALCIUM 9.6 9.4  MG  --  1.4*   Liver Function Tests  Recent Labs  01/29/16 0110  AST 24  ALT 16  ALKPHOS 58  BILITOT 0.6  PROT 6.8  ALBUMIN 3.7   Cardiac Enzymes  Recent Labs  01/29/16 0110 01/29/16 0758  TROPONINI 0.79* 1.01*   Thyroid Function Tests  Recent Labs  01/29/16 0110  TSH 2.214    Telemetry    Sinus rhythm/sinus rhythm.  ECG    Sinus brady, 54, lateral ST dep/TWI - similar to 3/31 ecg.  Radiology    Dg Chest 2 View  01/28/2016  CLINICAL DATA:  74-year-old female with chest pain EXAM: CHEST    2 VIEW COMPARISON:  Chest radiograph dated 11/20/2015 FINDINGS: Two views of the chest do not demonstrate a focal consolidation. There is no pleural effusion or pneumothorax. The cardiac silhouette is within normal limits. Median sternotomy wires and CABG vascular clips noted. There is osteopenia with degenerative changes of spine. No acute fracture. IMPRESSION: No active cardiopulmonary disease. Electronically Signed   By: Elgie CollardArash  Radparvar M.D.   On: 01/28/2016 19:24    Assessment & Plan    1.  NSTEMI/CAD:  S/p remote CABG and more recently NSTEMI with PCI of the RI.  Cath 11/21/15 revealed: VG  RPDA, VG  OM1, and VG  Diag2, in addition to occluded RCA and OM1, with subtotal occlusion of the mid LCX and severe, diffuse proximal LAD dzs (90 throughout).  The LIMA  LAD was patent.  She presented 6/6 with right sided c/p associated with nausea and diaphoresis along with belching.  Character of discomfort was similar to prior Ss in 10/2015, though previous Ss were sscp/left chest pain.  Troponin now up to 1.01.  Still with mild, dull ache.  Cont asa,  statin, plavix, heparin, ntg,  blocker.  Plan cath today.  The patient understands that risks include but are not limited to stroke (1 in 1000), death (1 in 1000), kidney failure [usually temporary] (1 in 500), bleeding (1 in 200), allergic reaction [possibly serious] (1 in 200), and agrees to proceed.    2.  Hypertensive Urgency/Hypertensive Heart Disease:  BP much improved.  Stick with IV ntg for now in setting of ongoing mild c/p.  Otw, cont  blocker.  '3.  HL:  LDL 80 in 10/2015.  Cont statin.  LFT's wnl.  4.  Type II DM:  Glucose elevated this AM.  Lantus held.  Cont SSI.  Currently NPO.  5.  Normocytic Anemia/History of vaginal bleeding: 3 days ago, noted spotting.  This has not recurred.  H/H drifting down since admission in absence of frank bleeding.  No recent melena/brbpr.  Follow.  Signed, Nicolasa Duckinghristopher Berge NP  Personally seen and examined. Agree with above.  75 year old with NSTEMI, known CAD. Recent Ramus PCI  - Trop 1  - Back to cath lab. Agrees.   - Optimize med mgt. Plavix Lungs clear, Heart RRR  Donato SchultzMark Masayuki Sakai, MD

## 2016-01-29 NOTE — Plan of Care (Signed)
Problem: Consults Goal: Skin Care Protocol Initiated - if Braden Score 18 or less If consults are not indicated, leave blank or document N/A Outcome: Not Applicable Date Met:  83/41/96 Braden >18

## 2016-01-29 NOTE — Interval H&P Note (Signed)
Cath Lab Visit (complete for each Cath Lab visit)  Clinical Evaluation Leading to the Procedure:   ACS: Yes.    Non-ACS:    Anginal Classification: CCS IV  Anti-ischemic medical therapy: Minimal Therapy (1 class of medications)  Non-Invasive Test Results: No non-invasive testing performed  Prior CABG: Previous CABG      History and Physical Interval Note:  01/29/2016 3:48 PM  Leah Small  has presented today for surgery, with the diagnosis of non stemi  The various methods of treatment have been discussed with the patient and family. After consideration of risks, benefits and other options for treatment, the patient has consented to  Procedure(s): Left Heart Cath and Cors/Grafts Angiography (N/A) as a surgical intervention .  The patient's history has been reviewed, patient examined, no change in status, stable for surgery.  I have reviewed the patient's chart and labs.  Questions were answered to the patient's satisfaction.     Leah Bollmanooper, Leah Small

## 2016-01-29 NOTE — Progress Notes (Signed)
Notified team of patient BP while nitro infusing last dinamap BP 96/51. Patient denies SOB or pain

## 2016-01-29 NOTE — Progress Notes (Signed)
Data: patient arrived via stretcher from ED at 2340 with ED Rn. Patient ambulated to bed with minimal assistance. Alert and oriented x4, speech clear, denies c/o pain or SOB.  Heparin and nitro infusing in patent PIV, See echarting for additional data  Action: oriented patient to room and floor, updated patient and family of POC and NPO status. Ensured needed items within reach, bed in lowest position, and assisted to Bathroom before getting into bed.   Response: patient safe denies c/o pain, and neurologically stable.

## 2016-01-29 NOTE — Progress Notes (Signed)
Patient Name: Leah Small Date of Encounter: 01/29/2016  Hospital Problem List     Principal Problem:   Non-STEMI (non-ST elevated myocardial infarction) [facility]) Active Problems:   Coronary atherosclerosis of native coronary artery   Hypertensive urgency   Diabetes mellitus with complication (HCC)   Hypertensive heart disease   Normocytic anemia   HLD (hyperlipidemia)   History of vaginal bleeding   GERD (gastroesophageal reflux disease)   Unstable angina (HCC)    Subjective   Overall feeling better but cont to c/o 3/10 - dull chest ache.  No dyspnea.  Troponin now up to 1.01.  Inpatient Medications    . aspirin EC  81 mg Oral Daily  . atorvastatin  80 mg Oral QHS  . brimonidine  1 drop Both Eyes BID  . clopidogrel  75 mg Oral Daily  . famotidine  20 mg Oral Daily  . glipiZIDE  10 mg Oral Q breakfast  . insulin glargine  24 Units Subcutaneous QHS  . latanoprost  1 drop Both Eyes QHS  . levETIRAcetam  750 mg Oral BID  . metoprolol tartrate  12.5 mg Oral BID  . timolol  1 drop Both Eyes BID    Vital Signs    Filed Vitals:   01/29/16 0535 01/29/16 0600 01/29/16 0715 01/29/16 0833  BP: 84/38 96/51 101/52   Pulse:   56 60  Temp:   97.7 F (36.5 C)   TempSrc:      Resp:      Height:      Weight:      SpO2:   94%     Intake/Output Summary (Last 24 hours) at 01/29/16 0921 Last data filed at 01/29/16 4098  Gross per 24 hour  Intake 120.88 ml  Output      0 ml  Net 120.88 ml   Filed Weights   01/29/16 0010  Weight: 146 lb 1.6 oz (66.271 kg)    Physical Exam    General: Pleasant, NAD. Neuro: Alert and oriented X 3. Moves all extremities spontaneously. Psych: Normal affect. HEENT:  Normal  Neck: Supple without bruits or JVD. Lungs:  Resp regular and unlabored, CTA. Heart: RRR no s3, s4, or murmurs. Abdomen: Soft, non-tender, non-distended, BS + x 4.  Extremities: No clubbing, cyanosis or edema. DP/PT/Radials 1+ and equal bilaterally.  Labs     CBC  Recent Labs  01/29/16 0110 01/29/16 0330  WBC 5.7 5.3  NEUTROABS 2.9  --   HGB 10.4* 9.5*  HCT 33.8* 30.6*  MCV 88.9 88.4  PLT 188 179   Basic Metabolic Panel  Recent Labs  01/28/16 1838 01/29/16 0110  NA 136 138  K 4.7 4.8  CL 103 103  CO2 24 26  GLUCOSE 228* 266*  BUN 18 14  CREATININE 0.75 0.79  CALCIUM 9.6 9.4  MG  --  1.4*   Liver Function Tests  Recent Labs  01/29/16 0110  AST 24  ALT 16  ALKPHOS 58  BILITOT 0.6  PROT 6.8  ALBUMIN 3.7   Cardiac Enzymes  Recent Labs  01/29/16 0110 01/29/16 0758  TROPONINI 0.79* 1.01*   Thyroid Function Tests  Recent Labs  01/29/16 0110  TSH 2.214    Telemetry    Sinus rhythm/sinus rhythm.  ECG    Sinus brady, 54, lateral ST dep/TWI - similar to 3/31 ecg.  Radiology    Dg Chest 2 View  01/28/2016  CLINICAL DATA:  75 year old female with chest pain EXAM: CHEST  2 VIEW COMPARISON:  Chest radiograph dated 11/20/2015 FINDINGS: Two views of the chest do not demonstrate a focal consolidation. There is no pleural effusion or pneumothorax. The cardiac silhouette is within normal limits. Median sternotomy wires and CABG vascular clips noted. There is osteopenia with degenerative changes of spine. No acute fracture. IMPRESSION: No active cardiopulmonary disease. Electronically Signed   By: Elgie CollardArash  Radparvar M.D.   On: 01/28/2016 19:24    Assessment & Plan    1.  NSTEMI/CAD:  S/p remote CABG and more recently NSTEMI with PCI of the RI.  Cath 11/21/15 revealed: VG  RPDA, VG  OM1, and VG  Diag2, in addition to occluded RCA and OM1, with subtotal occlusion of the mid LCX and severe, diffuse proximal LAD dzs (90 throughout).  The LIMA  LAD was patent.  She presented 6/6 with right sided c/p associated with nausea and diaphoresis along with belching.  Character of discomfort was similar to prior Ss in 10/2015, though previous Ss were sscp/left chest pain.  Troponin now up to 1.01.  Still with mild, dull ache.  Cont asa,  statin, plavix, heparin, ntg,  blocker.  Plan cath today.  The patient understands that risks include but are not limited to stroke (1 in 1000), death (1 in 1000), kidney failure [usually temporary] (1 in 500), bleeding (1 in 200), allergic reaction [possibly serious] (1 in 200), and agrees to proceed.    2.  Hypertensive Urgency/Hypertensive Heart Disease:  BP much improved.  Stick with IV ntg for now in setting of ongoing mild c/p.  Otw, cont  blocker.  '3.  HL:  LDL 80 in 10/2015.  Cont statin.  LFT's wnl.  4.  Type II DM:  Glucose elevated this AM.  Lantus held.  Cont SSI.  Currently NPO.  5.  Normocytic Anemia/History of vaginal bleeding: 3 days ago, noted spotting.  This has not recurred.  H/H drifting down since admission in absence of frank bleeding.  No recent melena/brbpr.  Follow.  Signed, Nicolasa Duckinghristopher Berge NP  Personally seen and examined. Agree with above.  75 year old with NSTEMI, known CAD. Recent Ramus PCI  - Trop 1  - Back to cath lab. Agrees.   - Optimize med mgt. Plavix Lungs clear, Heart RRR  Donato SchultzMark Skains, MD

## 2016-01-29 NOTE — Progress Notes (Signed)
ANTICOAGULATION CONSULT NOTE - Initial Consult  Pharmacy Consult for heparin Indication: chest pain/ACS  Allergies  Allergen Reactions  . Vicodin [Hydrocodone-Acetaminophen] Nausea And Vomiting  . Talwin [Pentazocine]   . Valium [Diazepam] Other (See Comments)    Makes her feel like she's flying    Patient Measurements: Height: 5\' 3"  (160 cm) Weight: 146 lb 1.6 oz (66.271 kg) IBW/kg (Calculated) : 52.4 Heparin Dosing Weight: 65.7 kg  Vital Signs: Temp: 97.8 F (36.6 C) (06/07 1133) Temp Source: Oral (06/07 1133) BP: 93/31 mmHg (06/07 1133) Pulse Rate: 51 (06/07 1133)  Labs:  Recent Labs  01/28/16 1838 01/29/16 0110 01/29/16 0330 01/29/16 0758 01/29/16 1219  HGB 11.3* 10.4* 9.5*  --   --   HCT 36.6 33.8* 30.6*  --   --   PLT 238 188 179  --   --   APTT  --  80*  --   --   --   LABPROT  --  14.6  --   --   --   INR  --  1.12  --   --   --   HEPARINUNFRC  --   --  0.28*  --  0.55  CREATININE 0.75 0.79  --   --   --   TROPONINI  --  0.79*  --  1.01*  --     Estimated Creatinine Clearance: 55.6 mL/min (by C-G formula based on Cr of 0.79).   Medical History: Past Medical History  Diagnosis Date  . Coronary artery disease   . Acid reflux   . Hypertension   . Hypercholesterolemia   . Chronic stable angina (HCC)   . Type II diabetes mellitus (HCC)   . 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 TIA (transient ischemic attack)     "several over the last 20 years" (10/22/2015)  . Arthritis     "all over"  . Psoriatic arthritis (HCC)   . Myocardial infarction (HCC) 1991; 07/06/2015  . NSTEMI (non-ST elevated myocardial infarction) (HCC) 10/22/2015    Medications:  Infusions:  . sodium chloride 1 mL/kg/hr (01/29/16 1215)  . heparin 1,100 Units/hr (01/29/16 0425)    Assessment: 74 yof presented to the ED with CP. To start IV heparin. She is not on oral anticoagulation PTA. Heparin level therapeutic  at 0.55 on 1100 units/hr. Will continue current rate and plan to check a confirmatory level in 8 hours if not dc'd for cath. Plan for cath this evening.   Goal of Therapy:  Heparin level 0.3-0.7 units/ml Monitor platelets by anticoagulation protocol: Yes   Plan:  - Continue Heparin gtt 1100 units/hr - Daily heparin level and CBC while on heparin drip - f/u post cath plans for heparin   Thank you for allowing us to participate in this patients care. Signe Coltonya C Nomi Rudnicki, PharmD Pager: [phone] 01/29/2016,1:11 PM

## 2016-01-30 ENCOUNTER — Encounter (HOSPITAL_COMMUNITY): Payer: Self-pay | Admitting: Cardiovascular Disease

## 2016-01-30 DIAGNOSIS — I25110 Atherosclerotic heart disease of native coronary artery with unstable angina pectoris: Secondary | ICD-10-CM

## 2016-01-30 LAB — BASIC METABOLIC PANEL
ANION GAP: 6 (ref 5–15)
BUN: 14 mg/dL (ref 6–20)
CALCIUM: 9 mg/dL (ref 8.9–10.3)
CO2: 27 mmol/L (ref 22–32)
Chloride: 105 mmol/L (ref 101–111)
Creatinine, Ser: 0.7 mg/dL (ref 0.44–1.00)
Glucose, Bld: 237 mg/dL — ABNORMAL HIGH (ref 65–99)
Potassium: 4.2 mmol/L (ref 3.5–5.1)
Sodium: 138 mmol/L (ref 135–145)

## 2016-01-30 LAB — CBC
HCT: 32.8 % — ABNORMAL LOW (ref 36.0–46.0)
HEMOGLOBIN: 10 g/dL — AB (ref 12.0–15.0)
MCH: 27.3 pg (ref 26.0–34.0)
MCHC: 30.5 g/dL (ref 30.0–36.0)
MCV: 89.6 fL (ref 78.0–100.0)
PLATELETS: 172 10*3/uL (ref 150–400)
RBC: 3.66 MIL/uL — AB (ref 3.87–5.11)
RDW: 14.9 % (ref 11.5–15.5)
WBC: 4.7 10*3/uL (ref 4.0–10.5)

## 2016-01-30 LAB — GLUCOSE, CAPILLARY: Glucose-Capillary: 173 mg/dL — ABNORMAL HIGH (ref 65–99)

## 2016-01-30 LAB — HEMOGLOBIN A1C
HEMOGLOBIN A1C: 6.9 % — AB (ref 4.8–5.6)
MEAN PLASMA GLUCOSE: 151 mg/dL

## 2016-01-30 LAB — HEPARIN LEVEL (UNFRACTIONATED)

## 2016-01-30 MED ORDER — METFORMIN HCL 1000 MG PO TABS: 1000.0000 mg | ORAL_TABLET | Freq: Two times a day (BID) | ORAL | Status: AC

## 2016-01-30 NOTE — Discharge Summary (Signed)
Discharge Summary    Patient ID: Leah Small,  MRN: [id number], DOB/AGE: 76-Aug-1942 76 y.o.  Admit date: 01/28/2016 Discharge date: 01/30/2016  Primary Care Provider: SPARKS,JEFFREY D Primary Cardiologist: Irena Cords, MD  Discharge Diagnoses    Principal Problem:   Non-STEMI (non-ST elevated myocardial infarction) (HCC)  **Stable coronary anatomy by catheterization this admission with patency of the LIMA  LAD and recently stented Ramus Intermedius.  Active Problems:   Coronary atherosclerosis of native coronary artery   Hypertensive urgency   Diabetes mellitus with complication (HCC)   Hypertensive heart disease   Normocytic anemia   HLD (hyperlipidemia)   History of vaginal bleeding   GERD (gastroesophageal reflux disease)   Allergies Allergies  Allergen Reactions  . Vicodin [Hydrocodone-Acetaminophen] Nausea And Vomiting  . Talwin [Pentazocine]   . Valium [Diazepam] Other (See Comments)    Makes her feel like she's flying    Diagnostic Studies/Procedures    Cardiac Catheterization 6.7.2017  Coronary Findings     Dominance: Right    Left Anterior Descending   . Prox LAD lesion, 100% stenosed.      Ramus Intermedius   . Ost Ramus to Ramus lesion, 0% stenosed. Previously placed Ost Ramus to Ramus drug eluting stent is patent.      Left Circumflex   . Mid Cx lesion, 100% stenosed.   . Third Obtuse Marginal Branch   3rd Mrg filled by collaterals from 2nd Sept.      Right Coronary Artery  Dist RCA filled by collaterals from 1st Sept.   . Prox RCA to Mid RCA lesion, 90% stenosed. Calcified diffuse.   . Mid RCA lesion, 100% stenosed.   . Inferior Septal   Inf Sept filled by collaterals from 1st Sept.      Graft Angiography     Free LIMA Graft to Mid LAD  LIMA Patent LIMA graft with extensive collaterals to the LCx and RCA      Free Graft to 1st Diag  SVG was not injected .   [name] Prox Graft lesion, 100% stenosed.      Free Graft to 1st Mrg  SVG was  not injected .   [name] Prox Graft lesion, 100% stenosed.      Free Graft to RPDA  SVG was not injected .   [name] Prox Graft lesion, 100% stenosed.       Heart     Left Ventricle There is moderate left ventricular systolic dysfunction. The left ventricular ejection fraction is 35-45% by visual estimate. There are wall motion abnormalities in the left ventricle. There are segmental wall motion abnormalities in the left ventricle.  _____________   History of Present Illness     75 year old female with a prior history of coronary artery disease status post coronary artery bypass grafting, hypertension, hyperlipidemia, type 2 diabetes mellitus, and GERD. She recently suffered a non-ST segment elevation myocardial infarction in March of this year with catheterization revealing severe native three-vessel disease including a 90% lesion in the ramus intermedius, with a patent LIMA to the LAD but otherwise occluded grafts to the RCA, left circumflex, and diagonal. The ramus intermedius stenosis was successfully stented at that time. She did well following discharge but on June 4 noted right-sided chest pressure radiating to her jaw. This recurred on June 6 and was not relieved by sublingual nitroglycerin. Her blood pressure was elevated at home and she called EMS. Symptoms became associated with diaphoresis, nausea, and vomiting. She was taken to [facility] for further evaluation. Here, ECG was non-acute and initial troponin was normal. She was admitted for further evaluation.  Hospital Course     Consultants: None   Ms. Taliercio ruled in for non-ST segment elevation myocardial infarction, eventually peaking her troponin at 1.01. She was maintained on heparin and IV nitroglycerin and did complain of mild, ongoing chest discomfort. In that setting, along with recent stenting of her ramus intermedius, decision was made to pursue diagnostic catheterization. This was performed on June 7 and revealed patency of the  ramus intermedius stent as well as patency of the LIMA to the LAD. His previous noted, the vein grafts to the first diagonal, first obtuse marginal, and RPDA were known to be occluded. The LAD, circumflex and RCA are also occluded. In the setting of stable anatomy without new targets for intervention, continued medical therapy was recommended. Ms. Brill has had no further chest pain and will be discharged home today in good condition. We have recommended that she follow-up with her primary cardiologist within the next week.  Of note, Ms. Ozga has had stable, normocytic anemia throughout this admission. She did report a history of vaginal bleeding/spotting occurring about 3 days prior to admission. She has had no recurrence of this. She has been advised follow-up with primary care. _____________  Discharge Vitals Blood pressure 111/53, pulse 56, temperature 97.7 F (36.5 C), temperature source Oral, resp. rate 16, height  (1.6 m), weight 144 lb 12.8 oz (65.681 kg), SpO2 96 %.  Filed Weights   01/29/16 0010 01/30/16 0525  Weight: 146 lb 1.6 oz (66.271 kg) 144 lb 12.8 oz (65.681 kg)    Labs & Radiologic Studies    CBC  Recent Labs  01/29/16 0110 01/29/16 0330 01/30/16 0445  WBC 5.7 5.3 4.7  NEUTROABS 2.9  --   --   HGB 10.4* 9.5* 10.0*  HCT 33.8* 30.6* 32.8*  MCV 88.9 88.4 89.6  PLT 188 179 172   Basic Metabolic Panel  Recent Labs  01/29/16 0110 01/30/16 0445  NA 138 138  K 4.8 4.2  CL 103 105  CO2 26 27  GLUCOSE 266* 237*  BUN 14 14  CREATININE 0.79 0.70  CALCIUM 9.4 9.0  MG 1.4*  --    Liver Function Tests  Recent Labs  01/29/16 0110  AST 24  ALT 16  ALKPHOS 58  BILITOT 0.6  PROT 6.8  ALBUMIN 3.7   Cardiac Enzymes  Recent Labs  01/29/16 0110 01/29/16 0758 01/29/16 1219  TROPONINI 0.79* 1.01* 0.84*   Hemoglobin A1C  Recent Labs  01/29/16 0110  HGBA1C 6.9*   Thyroid Function Tests  Recent Labs  01/29/16 0110  TSH 2.214    _____________  Dg Chest 2 View  01/28/2016  CLINICAL DATA:  75 year old female with chest pain EXAM: CHEST  2 VIEW COMPARISON:  Chest radiograph dated 11/20/2015 FINDINGS: Two views of the chest do not demonstrate a focal consolidation. There is no pleural effusion or pneumothorax. The cardiac silhouette is within normal limits. Median sternotomy wires and CABG vascular clips noted. There is osteopenia with degenerative changes of spine. No acute fracture. IMPRESSION: No active cardiopulmonary disease. Electronically Signed   By: Elgie Collard M.D.   On: 01/28/2016 19:24   Disposition   Pt is being discharged home today in good condition.  Follow-up Plans & Appointments    Follow-up Information    Follow up with Lamar Blinks, MD In 1 week.   Specialty:  Internal Medicine  Contact information:   [address] [facility] [place] [place] [id number] [phone]       Discharge Medications   Current Discharge Medication List    CONTINUE these medications which have CHANGED   Details  metFORMIN (GLUCOPHAGE) 1000 MG tablet Take 1 tablet (1,000 mg total) by mouth 2 (two) times daily. - RESUME 02/01/2016.      CONTINUE these medications which have NOT CHANGED   Details  aspirin EC 81 MG tablet Take 81 mg by mouth at bedtime.    atorvastatin (LIPITOR) 80 MG tablet Take 1 tablet (80 mg total) by mouth at bedtime. Qty: 30 tablet, Refills: 3    bimatoprost (LUMIGAN) 0.01 % SOLN Place 1 drop into both eyes at bedtime.    brimonidine (ALPHAGAN) 0.2 % ophthalmic solution Place 1 drop into both eyes 2 (two) times daily.     CALCIUM-MAGNESIUM-ZINC PO Take 1 tablet by mouth daily.    clopidogrel (PLAVIX) 75 MG tablet Take 75 mg by mouth daily.    glipiZIDE (GLUCOTROL XL) 10 MG 24 hr tablet Take 10 mg by mouth every morning.    Insulin Glargine (LANTUS SOLOSTAR) 100 UNIT/ML Solostar Pen Inject 24 Units into the skin at bedtime.    insulin lispro  (HUMALOG) 100 UNIT/ML injection Inject 6-8 Units into the skin 3 (three) times daily before meals. Per sliding scale: 6 units plus:   CBG 150-200 add 1 unit, 201-250 add 2 units, 251-300 add 3 units, 301-350 add 4 units 351-400 add 5 units, >400 add 6 units and call MD    isosorbide mononitrate (IMDUR) 30 MG 24 hr tablet Take 1 tablet (30 mg total) by mouth daily. Qty: 30 tablet, Refills: 1    levETIRAcetam (KEPPRA) 750 MG tablet Take 750 mg by mouth 2 (two) times daily.    metoprolol tartrate (LOPRESSOR) 25 MG tablet Take 12.5 mg by mouth at bedtime.     nitroGLYCERIN (NITROSTAT) 0.4 MG SL tablet Place 0.4 mg under the tongue every 5 (five) minutes as needed for chest pain.    nystatin cream (MYCOSTATIN) Apply 1 application topically 2 (two) times daily as needed (yeast infection).     ranitidine (ZANTAC) 150 MG tablet Take 150 mg by mouth daily.    timolol (BETIMOL) 0.5 % ophthalmic solution Place 1 drop into both eyes 2 (two) times daily.    triamcinolone ointment (KENALOG) 0.1 % Apply 1 application topically 3 (three) times daily as needed (itching from psoriasis).    vitamin B-12 (CYANOCOBALAMIN) 1000 MCG tablet Take 1,000 mcg by mouth daily.    vitamin E 400 UNIT capsule Take 400 Units by mouth daily.        Aspirin prescribed at discharge?  Yes High Intensity Statin Prescribed? (Lipitor 40-80mg  or Crestor 20-40mg ): Yes Beta Blocker Prescribed? Yes For EF <40%, was ACEI/ARB Prescribed? No: N/A ADP Receptor Inhibitor Prescribed? (i.e. Plavix etc.-Includes Medically Managed Patients): Yes For EF <40%, Aldosterone Inhibitor Prescribed? No: N/A Was EF assessed during THIS hospitalization? Yes Was Cardiac Rehab II ordered? (Included Medically managed Patients): Yes   Outstanding Labs/Studies   None  Duration of Discharge Encounter   Greater than 30 minutes including physician time.  Signed, Nicolasa Duckinghristopher Berge NP 01/30/2016, 12:01 PM    Personally seen and examined. Agree  with above. Cath - no change Meds - no change CP improved. Small NSTEMI  Cath site c/d/i  Donato SchultzMark Estephania Licciardi, MD

## 2016-01-30 NOTE — Discharge Instructions (Signed)
**PLEASE REMEMBER TO BRING ALL OF YOUR MEDICATIONS TO EACH OF YOUR FOLLOW-UP OFFICE VISITS.  Radial Site Care Refer to this sheet in the next few weeks. These instructions provide you with information on caring for yourself after your procedure. Your caregiver may also give you more specific instructions. Your treatment has been planned according to current medical practices, but problems sometimes occur. Call your caregiver if you have any problems or questions after your procedure. HOME CARE INSTRUCTIONS  You may shower the day after the procedure.Remove the bandage (dressing) and gently wash the site with plain soap and water.Gently pat the site dry.   Do not apply powder or lotion to the site.   Do not submerge the affected site in water for 3 to 5 days.   Inspect the site at least twice daily.   Do not flex or bend the affected arm for 24 hours.   No lifting over 5 pounds (2.3 kg) for 5 days after your procedure.   Do not drive home if you are discharged the same day of the procedure. Have someone else drive you.   You may drive 24 hours after the procedure unless otherwise instructed by your caregiver.  What to expect:  Any bruising will usually fade within 1 to 2 weeks.   Blood that collects in the tissue (hematoma) may be painful to the touch. It should usually decrease in size and tenderness within 1 to 2 weeks.  SEEK IMMEDIATE MEDICAL CARE IF:  You have unusual pain at the radial site.   You have redness, warmth, swelling, or pain at the radial site.   You have drainage (other than a small amount of blood on the dressing).   You have chills.   You have a fever or persistent symptoms for more than 72 hours.   You have a fever and your symptoms suddenly get worse.   Your arm becomes pale, cool, tingly, or numb.   You have heavy bleeding from the site. Hold pressure on the site.      10 Habits of Highly Healthy People  [facility] wants to help you get well  and stay well.  Live a longer, healthier life by practicing healthy habits every day.  1.  Visit your primary care provider regularly. 2.  Make time for family and friends.  Healthy relationships are important. 3.  Take medications as directed by your provider. 4.  Maintain a healthy weight and a trim waistline. 5.  Eat healthy meals and snacks, rich in fruits, vegetables, whole grains, and lean proteins. 6.  Get moving every day - aim for 150 minutes of moderate physical activity each week. 7.  Don't smoke. 8.  Avoid alcohol or drink in moderation. 9.  Manage stress through meditation or mindful relaxation. 10.  Get seven to nine hours of quality sleep each night.  Want more information on healthy habits?  To learn more about these and other healthy habits, visit [employer]. _____________     Radial Site Care Refer to this sheet in the next few weeks. These instructions provide you with information about caring for yourself after your procedure. Your health care provider may also give you more specific instructions. Your treatment has been planned according to current medical practices, but problems sometimes occur. Call your health care provider if you have any problems or questions after your procedure. WHAT TO EXPECT AFTER THE PROCEDURE After your procedure, it is typical to have the following:  Bruising at the radial  site that usually fades within 1-2 weeks.  Blood collecting in the tissue (hematoma) that may be painful to the touch. It should usually decrease in size and tenderness within 1-2 weeks. HOME CARE INSTRUCTIONS  Take medicines only as directed by your health care provider.  You may shower 24-48 hours after the procedure or as directed by your health care provider. Remove the bandage (dressing) and gently wash the site with plain soap and water. Pat the area dry with a clean towel. Do not rub the site, because this may cause bleeding.  Do not take baths,  swim, or use a hot tub until your health care provider approves.  Check your insertion site every day for redness, swelling, or drainage.  Do not apply powder or lotion to the site.  Do not flex or bend the affected arm for 24 hours or as directed by your health care provider.  Do not push or pull heavy objects with the affected arm for 24 hours or as directed by your health care provider.  Do not lift over 10 lb (4.5 kg) for 5 days after your procedure or as directed by your health care provider.  Ask your health care provider when it is okay to:  Return to work or school.  Resume usual physical activities or sports.  Resume sexual activity.  Do not drive home if you are discharged the same day as the procedure. Have someone else drive you.  You may drive 24 hours after the procedure unless otherwise instructed by your health care provider.  Do not operate machinery or power tools for 24 hours after the procedure.  If your procedure was done as an outpatient procedure, which means that you went home the same day as your procedure, a responsible adult should be with you for the first 24 hours after you arrive home.  Keep all follow-up visits as directed by your health care provider. This is important. SEEK MEDICAL CARE IF:  You have a fever.  You have chills.  You have increased bleeding from the radial site. Hold pressure on the site. SEEK IMMEDIATE MEDICAL CARE IF:  You have unusual pain at the radial site.  You have redness, warmth, or swelling at the radial site.  You have drainage (other than a small amount of blood on the dressing) from the radial site.  The radial site is bleeding, and the bleeding does not stop after 30 minutes of holding steady pressure on the site.  Your arm or hand becomes pale, cool, tingly, or numb.   This information is not intended to replace advice given to you by your health care provider. Make sure you discuss any questions you have  with your health care provider.   Document Released: 09/12/2010 Document Revised: 08/31/2014 Document Reviewed: 02/26/2014 Elsevier Interactive Patient Education [employer]2016 Elsevier Inc.

## 2016-01-30 NOTE — Care Management Important Message (Signed)
Important Message  Patient Details  Name: Cedric FishmanDonnie R Mcpartland MRN: [id number] Date of Birth: [birth year]   Medicare Important Message Given:  Yes    Kyla BalzarineShealy, Luis Nickles Abena 01/30/2016, 10:41 AM

## 2016-01-30 NOTE — Progress Notes (Signed)
Patient Name: Leah Small Date of Encounter: 01/30/2016  Hospital Problem List     Principal Problem:   Non-STEMI (non-ST elevated myocardial infarction) [facility]) Active Problems:   Diabetes mellitus with complication (HCC)   HLD (hyperlipidemia)   Unstable angina (HCC)   Coronary atherosclerosis of native coronary artery   Hypertensive heart disease   Hypertensive urgency   Normocytic anemia   History of vaginal bleeding   GERD (gastroesophageal reflux disease)    Subjective   She is ready to go home. Cardiac catheterization showed no change from prior. No chest pain. No shortness of breath. Ambulating well. Son in room.  Inpatient Medications    . aspirin EC  81 mg Oral Daily  . atorvastatin  80 mg Oral QHS  . brimonidine  1 drop Both Eyes BID  . clopidogrel  75 mg Oral Daily  . famotidine  20 mg Oral Daily  . glipiZIDE  10 mg Oral Q breakfast  . insulin glargine  24 Units Subcutaneous QHS  . latanoprost  1 drop Both Eyes QHS  . levETIRAcetam  750 mg Oral BID  . metoprolol tartrate  12.5 mg Oral BID  . sodium chloride flush  3 mL Intravenous Q12H  . timolol  1 drop Both Eyes BID    Vital Signs    Filed Vitals:   01/29/16 1935 01/30/16 0023 01/30/16 0525 01/30/16 0737  BP:  109/49 111/53   Pulse: 67 65 56   Temp:  97.9 F (36.6 C) 97.7 F (36.5 C) 97.7 F (36.5 C)  TempSrc:  Oral Oral Oral  Resp:  16 16   Height:      Weight:   144 lb 12.8 oz (65.681 kg)   SpO2: 94% 96% 96%     Intake/Output Summary (Last 24 hours) at 01/30/16 1130 Last data filed at 01/30/16 0900  Gross per 24 hour  Intake    240 ml  Output      0 ml  Net    240 ml   Filed Weights   01/29/16 0010 01/30/16 0525  Weight: 146 lb 1.6 oz (66.271 kg) 144 lb 12.8 oz (65.681 kg)    Physical Exam    General: Pleasant, NAD. Neuro: Alert and oriented X 3. Moves all extremities spontaneously. Psych: Normal affect. HEENT:  Normal  Neck: Supple without bruits or JVD. Lungs:  Resp  regular and unlabored, CTA. Heart: RRR no s3, s4, or murmurs. Abdomen: Soft, non-tender, non-distended, BS + x 4.  Extremities: No clubbing, cyanosis or edema. DP/PT/Radials 1+ and equal bilaterally.She has mild hematoma radial artery site. Mildly tender. Good distal pulses. Normal capillary refill.  Labs    CBC  Recent Labs  01/29/16 0110 01/29/16 0330 01/30/16 0445  WBC 5.7 5.3 4.7  NEUTROABS 2.9  --   --   HGB 10.4* 9.5* 10.0*  HCT 33.8* 30.6* 32.8*  MCV 88.9 88.4 89.6  PLT 188 179 172   Basic Metabolic Panel  Recent Labs  01/29/16 0110 01/30/16 0445  NA 138 138  K 4.8 4.2  CL 103 105  CO2 26 27  GLUCOSE 266* 237*  BUN 14 14  CREATININE 0.79 0.70  CALCIUM 9.4 9.0  MG 1.4*  --    Liver Function Tests  Recent Labs  01/29/16 0110  AST 24  ALT 16  ALKPHOS 58  BILITOT 0.6  PROT 6.8  ALBUMIN 3.7   Cardiac Enzymes  Recent Labs  01/29/16 0110 01/29/16 0758 01/29/16 1219  TROPONINI  0.79* 1.01* 0.84*   Thyroid Function Tests  Recent Labs  01/29/16 0110  TSH 2.214    Telemetry    Sinus rhythm/sinus rhythm.  ECG    Sinus brady, 54, lateral ST dep/TWI - similar to 3/31 ecg.  Radiology    Dg Chest 2 View  01/28/2016  CLINICAL DATA:  75 year old female with chest pain EXAM: CHEST  2 VIEW COMPARISON:  Chest radiograph dated 11/20/2015 FINDINGS: Two views of the chest do not demonstrate a focal consolidation. There is no pleural effusion or pneumothorax. The cardiac silhouette is within normal limits. Median sternotomy wires and CABG vascular clips noted. There is osteopenia with degenerative changes of spine. No acute fracture. IMPRESSION: No active cardiopulmonary disease. Electronically Signed   By: Elgie CollardArash  Radparvar M.D.   On: 01/28/2016 19:24    Assessment & Plan    1.  NSTEMI/CAD:  Cardiac catheterization showed no significant change from prior. S/p remote CABG and more recently NSTEMI with PCI of the RI.  Cath 11/21/15 revealed: VG  RPDA, VG   OM1, and VG  Diag2, in addition to occluded RCA and OM1, with subtotal occlusion of the mid LCX and severe, diffuse proximal LAD dzs (90 throughout).  The LIMA  LAD was patent.  She presented 6/6 with right sided c/p associated with nausea and diaphoresis along with belching.  Character of discomfort was similar to prior Ss in 10/2015, though previous Ss were sscp/left chest pain.  Troponin now up to 1.01.  Still with mild, dull ache.  Cont asa, statin, plavix, heparin, ntg,  blocker.    2.  Hypertensive Urgency/Hypertensive Heart Disease:  BP much improved.  Resolved  '3.  HL:  LDL 80 in 10/2015.  Cont statin.  LFT's wnl.  4.  Type II DM:  Continue with home meds   5.  Normocytic Anemia/History of vaginal bleeding: 3 days ago, noted spotting.  This has not recurred.  H/H drifting down since admission in absence of frank bleeding.  No recent melena/brbpr.  Follow.  We will discharge home. No change in medications. Small non-STEMI.  Signed, Donato SchultzMark Skains MD

## 2016-02-29 ENCOUNTER — Emergency Department (HOSPITAL_COMMUNITY)
Admission: EM | Admit: 2016-02-29 | Discharge: 2016-02-29 | Disposition: A | Discharge status: Home / Self Care | Payer: Medicare Other | Attending: Emergency Medicine | Admitting: Emergency Medicine

## 2016-02-29 ENCOUNTER — Encounter (HOSPITAL_COMMUNITY): Payer: Self-pay | Admitting: Emergency Medicine

## 2016-02-29 ENCOUNTER — Emergency Department (HOSPITAL_COMMUNITY): Payer: Medicare Other

## 2016-02-29 DIAGNOSIS — I10 Essential (primary) hypertension: Secondary | ICD-10-CM | POA: Diagnosis not present

## 2016-02-29 DIAGNOSIS — Z7984 Long term (current) use of oral hypoglycemic drugs: Secondary | ICD-10-CM | POA: Diagnosis not present

## 2016-02-29 DIAGNOSIS — E119 Type 2 diabetes mellitus without complications: Secondary | ICD-10-CM | POA: Insufficient documentation

## 2016-02-29 DIAGNOSIS — Z8673 Personal history of transient ischemic attack (TIA), and cerebral infarction without residual deficits: Secondary | ICD-10-CM | POA: Insufficient documentation

## 2016-02-29 DIAGNOSIS — R079 Chest pain, unspecified: Secondary | ICD-10-CM | POA: Diagnosis not present

## 2016-02-29 DIAGNOSIS — Z794 Long term (current) use of insulin: Secondary | ICD-10-CM | POA: Diagnosis not present

## 2016-02-29 DIAGNOSIS — I2510 Atherosclerotic heart disease of native coronary artery without angina pectoris: Secondary | ICD-10-CM | POA: Diagnosis not present

## 2016-02-29 DIAGNOSIS — Z79899 Other long term (current) drug therapy: Secondary | ICD-10-CM | POA: Diagnosis not present

## 2016-02-29 DIAGNOSIS — Z951 Presence of aortocoronary bypass graft: Secondary | ICD-10-CM | POA: Diagnosis not present

## 2016-02-29 DIAGNOSIS — I252 Old myocardial infarction: Secondary | ICD-10-CM | POA: Diagnosis not present

## 2016-02-29 DIAGNOSIS — Z7982 Long term (current) use of aspirin: Secondary | ICD-10-CM | POA: Insufficient documentation

## 2016-02-29 LAB — BASIC METABOLIC PANEL
Anion gap: 9 (ref 5–15)
BUN: 15 mg/dL (ref 6–20)
CHLORIDE: 105 mmol/L (ref 101–111)
CO2: 24 mmol/L (ref 22–32)
Calcium: 9.7 mg/dL (ref 8.9–10.3)
Creatinine, Ser: 0.8 mg/dL (ref 0.44–1.00)
GFR calc non Af Amer: >60 mL/min (ref >60)
Glucose, Bld: 210 mg/dL — ABNORMAL HIGH (ref 65–99)
POTASSIUM: 4.1 mmol/L (ref 3.5–5.1)
SODIUM: 138 mmol/L (ref 135–145)

## 2016-02-29 LAB — CBC
HEMATOCRIT: 36.4 % (ref 36.0–46.0)
HEMOGLOBIN: 11.3 g/dL — AB (ref 12.0–15.0)
MCH: 28.2 pg (ref 26.0–34.0)
MCHC: 31 g/dL (ref 30.0–36.0)
MCV: 90.8 fL (ref 78.0–100.0)
Platelets: 214 10*3/uL (ref 150–400)
RBC: 4.01 MIL/uL (ref 3.87–5.11)
RDW: 14.6 % (ref 11.5–15.5)
WBC: 6.4 10*3/uL (ref 4.0–10.5)

## 2016-02-29 LAB — I-STAT TROPONIN, ED
TROPONIN I, POC: 0.04 ng/mL (ref 0.00–0.08)
Troponin i, poc: 0.03 ng/mL (ref 0.00–0.08)

## 2016-02-29 NOTE — ED Notes (Signed)
Pt departed in NAD.  

## 2016-02-29 NOTE — Discharge Instructions (Signed)
Nonspecific Chest Pain  °Chest pain can be caused by many different conditions. There is always a chance that your pain could be related to something serious, such as a heart attack or a blood clot in your lungs. Chest pain can also be caused by conditions that are not life-threatening. If you have chest pain, it is very important to follow up with your health care provider. °CAUSES  °Chest pain can be caused by: °· Heartburn. °· Pneumonia or bronchitis. °· Anxiety or stress. °· Inflammation around your heart (pericarditis) or lung (pleuritis or pleurisy). °· A blood clot in your lung. °· A collapsed lung (pneumothorax). It can develop suddenly on its own (spontaneous pneumothorax) or from trauma to the chest. °· Shingles infection (varicella-zoster virus). °· Heart attack. °· Damage to the bones, muscles, and cartilage that make up your chest wall. This can include: °¨ Bruised bones due to injury. °¨ Strained muscles or cartilage due to frequent or repeated coughing or overwork. °¨ Fracture to one or more ribs. °¨ Sore cartilage due to inflammation (costochondritis). °RISK FACTORS  °Risk factors for chest pain may include: °· Activities that increase your risk for trauma or injury to your chest. °· Respiratory infections or conditions that cause frequent coughing. °· Medical conditions or overeating that can cause heartburn. °· Heart disease or family history of heart disease. °· Conditions or health behaviors that increase your risk of developing a blood clot. °· Having had chicken pox (varicella zoster). °SIGNS AND SYMPTOMS °Chest pain can feel like: °· Burning or tingling on the surface of your chest or deep in your chest. °· Crushing, pressure, aching, or squeezing pain. °· Dull or sharp pain that is worse when you move, cough, or take a deep breath. °· Pain that is also felt in your back, neck, shoulder, or arm, or pain that spreads to any of these areas. °Your chest pain may come and go, or it may stay  constant. °DIAGNOSIS °Lab tests or other studies may be needed to find the cause of your pain. Your health care provider may have you take a test called an ambulatory ECG (electrocardiogram). An ECG records your heartbeat patterns at the time the test is performed. You may also have other tests, such as: °· Transthoracic echocardiogram (TTE). During echocardiography, sound waves are used to create a picture of all of the heart structures and to look at how blood flows through your heart. °· Transesophageal echocardiogram (TEE). This is a more advanced imaging test that obtains images from inside your body. It allows your health care provider to see your heart in finer detail. °· Cardiac monitoring. This allows your health care provider to monitor your heart rate and rhythm in real time. °· Holter monitor. This is a portable device that records your heartbeat and can help to diagnose abnormal heartbeats. It allows your health care provider to track your heart activity for several days, if needed. °· Stress tests. These can be done through exercise or by taking medicine that makes your heart beat more quickly. °· Blood tests. °· Imaging tests. °TREATMENT  °Your treatment depends on what is causing your chest pain. Treatment may include: °· Medicines. These may include: °¨ Acid blockers for heartburn. °¨ Anti-inflammatory medicine. °¨ Pain medicine for inflammatory conditions. °¨ Antibiotic medicine, if an infection is present. °¨ Medicines to dissolve blood clots. °¨ Medicines to treat coronary artery disease. °· Supportive care for conditions that do not require medicines. This may include: °¨ Resting. °¨ Applying heat   or cold packs to injured areas. °¨ Limiting activities until pain decreases. °HOME CARE INSTRUCTIONS °· If you were prescribed an antibiotic medicine, finish it all even if you start to feel better. °· Avoid any activities that bring on chest pain. °· Do not use any tobacco products, including  cigarettes, chewing tobacco, or electronic cigarettes. If you need help quitting, ask your health care provider. °· Do not drink alcohol. °· Take medicines only as directed by your health care provider. °· Keep all follow-up visits as directed by your health care provider. This is important. This includes any further testing if your chest pain does not go away. °· If heartburn is the cause for your chest pain, you may be told to keep your head raised (elevated) while sleeping. This reduces the chance that acid will go from your stomach into your esophagus. °· Make lifestyle changes as directed by your health care provider. These may include: °¨ Getting regular exercise. Ask your health care provider to suggest some activities that are safe for you. °¨ Eating a heart-healthy diet. A registered dietitian can help you to learn healthy eating options. °¨ Maintaining a healthy weight. °¨ Managing diabetes, if necessary. °¨ Reducing stress. °SEEK MEDICAL CARE IF: °· Your chest pain does not go away after treatment. °· You have a rash with blisters on your chest. °· You have a fever. °SEEK IMMEDIATE MEDICAL CARE IF:  °· Your chest pain is worse. °· You have an increasing cough, or you cough up blood. °· You have severe abdominal pain. °· You have severe weakness. °· You faint. °· You have chills. °· You have sudden, unexplained chest discomfort. °· You have sudden, unexplained discomfort in your arms, back, neck, or jaw. °· You have shortness of breath at any time. °· You suddenly start to sweat, or your skin gets clammy. °· You feel nauseous or you vomit. °· You suddenly feel light-headed or dizzy. °· Your heart begins to beat quickly, or it feels like it is skipping beats. °These symptoms may represent a serious problem that is an emergency. Do not wait to see if the symptoms will go away. Get medical help right away. Call your local emergency services (911 in the [place] Do not drive yourself to the hospital. °  °This  information is not intended to replace advice given to you by your health care provider. Make sure you discuss any questions you have with your health care provider. °  °Document Released: 05/20/2005 Document Revised: 08/31/2014 Document Reviewed: 03/16/2014 °Elsevier Interactive Patient Education ©2016 [employer] ° °

## 2016-02-29 NOTE — ED Notes (Signed)
Per [facility] patient complains of chest pain that started at 1415.  Took 3x SL nitro prior to EMS arrival.  History of MI in November 2016.  EMS reports patient rated pain decreased after burping.  Patient also has history of complex focal seizures.  Patient alert and oriented at this time.

## 2016-03-01 NOTE — ED Provider Notes (Signed)
CSN: [id number]     Arrival date & time 02/29/16  1716 History   First MD Initiated Contact with Patient 02/29/16 1740     Chief Complaint  Patient presents with  . Chest Pain     (Consider location/radiation/quality/duration/timing/severity/associated sxs/prior Treatment) HPI   Leah Small is a 75 y.o. female who presents for evaluation of chest pain which started about 4 hours prior to arrival to emergency department. She took a nitroglycerin, and had partial improvement. The pain occurred while at rest, and she was then able to go back to sleep. After that the pain reoccurred, when she awoke about 1 hour later. She took another nitroglycerin, and then went back to sleep. About an hour prior to arrival, she awoke and came in, noticed the pain and took a nitroglycerin. On arrival to the emergency department. The pain was almost gone, measuring now 1/10. The pain was about 5/10, the earlier times. There was no associated nausea, vomiting, diaphoresis or shortness of breath. She has similar pain to this 2-3 times per week, but this was "worse". Usually takes nitroglycerin when she has the pain. She's been evaluated by her cardiologist several times within the last 6 months, and is reported to have a cardiac catheterization with stenting done in March 2017, and a relook catheterization in June 2017. She was told that there was no additional stenting possible at the repeat catheterization. There are no other known modifying factors.   Past Medical History  Diagnosis Date  . Coronary artery disease   . Acid reflux   . Hypertension   . Hypercholesterolemia   . Chronic stable angina (HCC)   . Type II diabetes mellitus (HCC)   . 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 TIA (transient ischemic attack)     "several over the last 20 years" (10/22/2015)  . Arthritis     "all over"  . Psoriatic arthritis (HCC)   . Myocardial infarction  (HCC) 1991; 07/06/2015  . NSTEMI (non-ST elevated myocardial infarction) (HCC) 10/22/2015   Past Surgical History  Procedure Laterality Date  . Tonsillectomy  ~ 1947    2nd grade  . Appendectomy  1950s    elementary school  . Cholecystectomy open  ~ 2014  . Abdominal hysterectomy  1973  . Dilation and curettage of uterus  X 1  . 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 Carotid stent insertion Bilateral 2011-2012    right-left  . Cataract extraction w/ intraocular lens  implant, bilateral Bilateral 2009-2010  . 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 Cardiac catheterization  X 2-3  . Coronary angioplasty    . Coronary angioplasty with stent placement  11/21/2015  . Cardiac catheterization N/A 11/21/2015    Procedure: Left Heart Cath and Coronary Angiography;  Surgeon: Corky Crafts, MD;  Location: [facility] INVASIVE CV LAB;  Service: Cardiovascular;  Laterality: N/A;  . Cardiac catheterization  11/21/2015    Procedure: Coronary Stent Intervention;  Surgeon: Corky Crafts, MD;  Location: [facility] INVASIVE CV LAB;  Service: Cardiovascular;;  . Cardiac catheterization N/A 01/29/2016    Procedure: Left Heart Cath and Cors/Grafts Angiography;  Surgeon: Tonny Bollman, MD;  Location: [facility] INVASIVE CV LAB;  Service: Cardiovascular;  Laterality: N/A;   Family History  Problem Relation Age of Onset  . Heart attack Father   . Diabetes Mother   .  Cancer Mother   . Stroke Mother    Social History  Substance Use Topics  . Smoking status: Never Smoker   . Smokeless tobacco: Never Used  . Alcohol Use: No   OB History    No data available     Review of Systems  All other systems reviewed and are negative.     Allergies  Valium; Vicodin; Isosorbide nitrate; Levofloxacin; Simvastatin; and Talwin  Home Medications   Prior to Admission medications   Medication Sig Start Date End Date Taking? Authorizing Provider  aspirin EC 81 MG tablet  Take 81 mg by mouth at bedtime.   Yes Historical Provider, MD  atorvastatin (LIPITOR) 80 MG tablet Take 1 tablet (80 mg total) by mouth at bedtime. 11/22/15  Yes Ripudeep K Rai, MD  bimatoprost (LUMIGAN) 0.01 % SOLN Place 1 drop into both eyes at bedtime.   Yes Historical Provider, MD  brimonidine (ALPHAGAN) 0.2 % ophthalmic solution Place 1 drop into both eyes 2 (two) times daily.    Yes Historical Provider, MD  CALCIUM-MAGNESIUM-ZINC PO Take 1 tablet by mouth daily.   Yes Historical Provider, MD  clopidogrel (PLAVIX) 75 MG tablet Take 75 mg by mouth daily.   Yes Historical Provider, MD  glipiZIDE (GLUCOTROL XL) 10 MG 24 hr tablet Take 10 mg by mouth every morning. 04/26/15  Yes Historical Provider, MD  Insulin Glargine (LANTUS SOLOSTAR) 100 UNIT/ML Solostar Pen Inject 24 Units into the skin at bedtime.   Yes Historical Provider, MD  insulin lispro (HUMALOG) 100 UNIT/ML injection Inject 6-8 Units into the skin 3 (three) times daily before meals. Per sliding scale: 6 units plus:   CBG 150-200 add 1 unit, 201-250 add 2 units, 251-300 add 3 units, 301-350 add 4 units 351-400 add 5 units, >400 add 6 units and call MD   Yes Historical Provider, MD  isosorbide mononitrate (IMDUR) 30 MG 24 hr tablet Take 1 tablet (30 mg total) by mouth daily. 11/22/15  Yes Bhavinkumar Bhagat, PA  levETIRAcetam (KEPPRA) 750 MG tablet Take 750 mg by mouth 2 (two) times daily.   Yes Historical Provider, MD  metFORMIN (GLUCOPHAGE) 1000 MG tablet Take 1 tablet (1,000 mg total) by mouth 2 (two) times daily. 01/30/16  Yes Ok Anishristopher R Berge, NP  metoprolol tartrate (LOPRESSOR) 25 MG tablet Take 12.5 mg by mouth at bedtime.    Yes Historical Provider, MD  nitroGLYCERIN (NITROSTAT) 0.4 MG SL tablet Place 0.4 mg under the tongue every 5 (five) minutes as needed for chest pain.   Yes Historical Provider, MD  nystatin cream (MYCOSTATIN) Apply 1 application topically 2 (two) times daily as needed (yeast infection).  04/25/15  Yes Historical  Provider, MD  ranitidine (ZANTAC) 150 MG tablet Take 150 mg by mouth daily.   Yes Historical Provider, MD  timolol (BETIMOL) 0.5 % ophthalmic solution Place 1 drop into both eyes 2 (two) times daily.   Yes Historical Provider, MD  triamcinolone ointment (KENALOG) 0.1 % Apply 1 application topically 3 (three) times daily as needed (itching from psoriasis).   Yes Historical Provider, MD  vitamin B-12 (CYANOCOBALAMIN) 1000 MCG tablet Take 1,000 mcg by mouth daily.   Yes Historical Provider, MD  vitamin E 400 UNIT capsule Take 400 Units by mouth daily.   Yes Historical Provider, MD   BP 166/73 mmHg  Pulse 62  Temp(Src) 98.3 F (36.8 C) (Oral)  Resp 17  SpO2 100% Physical Exam  Constitutional: She is oriented to person, place, and time. She appears  well-developed. No distress.  Elderly, frail  HENT:  Head: Normocephalic and atraumatic.  Right Ear: External ear normal.  Left Ear: External ear normal.  Eyes: Conjunctivae and EOM are normal. Pupils are equal, round, and reactive to light.  Neck: Normal range of motion and phonation normal. Neck supple.  Cardiovascular: Normal rate, regular rhythm and normal heart sounds.   Pulmonary/Chest: Effort normal and breath sounds normal. She exhibits no bony tenderness.  Abdominal: Soft. There is no tenderness.  Musculoskeletal: Normal range of motion.  Neurological: She is alert and oriented to person, place, and time. No cranial nerve deficit or sensory deficit. She exhibits normal muscle tone. Coordination normal.  Skin: Skin is warm, dry and intact.  Psychiatric: She has a normal mood and affect. Her behavior is normal.  Nursing note and vitals reviewed.   ED Course  Procedures (including critical care time)  Medications - No data to display  Patient Vitals for the past 24 hrs:  BP Temp Temp src Pulse Resp SpO2  02/29/16 2230 166/73 mmHg - - 62 17 100 %  02/29/16 2145 126/64 mmHg - - (!) 59 17 98 %  02/29/16 2130 (!) 134/52 mmHg - - 62 17  97 %  02/29/16 2115 (!) 137/49 mmHg - - 63 14 96 %  02/29/16 2100 142/59 mmHg - - 61 13 96 %  02/29/16 2045 (!) 136/47 mmHg - - 63 15 96 %  02/29/16 2030 (!) 137/49 mmHg - - 61 14 96 %  02/29/16 2015 (!) 132/53 mmHg - - 64 14 95 %  02/29/16 2000 133/60 mmHg - - 63 17 96 %  02/29/16 1945 142/62 mmHg - - 66 21 97 %  02/29/16 1930 144/66 mmHg - - 65 19 97 %  02/29/16 1723 157/56 mmHg 98.3 F (36.8 C) Oral 74 22 99 %    At discharge- Reevaluation with update and discussion. After initial assessment and treatment, an updated evaluation reveals she remains comfortable, no pain at this time. Findings discussed with patient and son, all questions answered. Saskia Simerson L    Labs Review Labs Reviewed  BASIC METABOLIC PANEL - Abnormal; Notable for the following:    Glucose, Bld 210 (*)    All other components within normal limits  CBC - Abnormal; Notable for the following:    Hemoglobin 11.3 (*)    All other components within normal limits  I-STAT TROPOININ, ED  I-STAT TROPOININ, ED    Imaging Review Dg Chest 2 View  02/29/2016  CLINICAL DATA:  Medial chest pain and shortness of breath EXAM: CHEST  2 VIEW COMPARISON:  January 28, 2016 FINDINGS: The heart size and mediastinal contours are within normal limits. Both lungs are clear. The visualized skeletal structures are unremarkable. IMPRESSION: No acute abnormalities. Electronically Signed   By: Gerome Sam III M.D   On: 02/29/2016 18:45   I have personally reviewed and evaluated these images and lab results as part of my medical decision-making.   EKG Interpretation   Date/Time:  Saturday February 29 2016 17:21:21 EDT Ventricular Rate:  73 PR Interval:    QRS Duration: 113 QT Interval:  386 QTC Calculation: 426 R Axis:   109 Text Interpretation:  Sinus rhythm Consider left atrial enlargement  Borderline intraventricular conduction delay Repol abnrm suggests  ischemia, anterolateral since last tracing no significant change Confirmed   by Effie Shy  MD, Rydge Texidor ([id number]) on 02/29/2016 6:56:50 PM      MDM   Final diagnoses:  Chest pain, unspecified  chest pain type    Evaluation, consistent with chronic stable angina. Doubt ACS, PE or pneumonia.   Nursing Notes Reviewed/ Care Coordinated Applicable Imaging Reviewed Interpretation of Laboratory Data incorporated into ED treatment  The patient appears reasonably screened and/or stabilized for discharge and I doubt any other medical condition or other [facility] requiring further screening, evaluation, or treatment in the ED at this time prior to discharge.  Plan: Home Medications- usual; Home Treatments- rest; return here if the recommended treatment, does not improve the symptoms; Recommended follow up- Cardiology 1 week    Mancel Bale, MD 03/01/16 [phone]

## 2016-03-29 ENCOUNTER — Emergency Department (HOSPITAL_COMMUNITY): Payer: Medicare Other

## 2016-03-29 ENCOUNTER — Emergency Department (HOSPITAL_COMMUNITY)
Admission: EM | Admit: 2016-03-29 | Discharge: 2016-03-29 | Disposition: A | Discharge status: Home / Self Care | Payer: Medicare Other | Attending: Emergency Medicine | Admitting: Emergency Medicine

## 2016-03-29 DIAGNOSIS — G8194 Hemiplegia, unspecified affecting left nondominant side: Secondary | ICD-10-CM | POA: Diagnosis not present

## 2016-03-29 DIAGNOSIS — I252 Old myocardial infarction: Secondary | ICD-10-CM | POA: Insufficient documentation

## 2016-03-29 DIAGNOSIS — R471 Dysarthria and anarthria: Secondary | ICD-10-CM

## 2016-03-29 DIAGNOSIS — R200 Anesthesia of skin: Secondary | ICD-10-CM

## 2016-03-29 DIAGNOSIS — I119 Hypertensive heart disease without heart failure: Secondary | ICD-10-CM | POA: Insufficient documentation

## 2016-03-29 DIAGNOSIS — Z794 Long term (current) use of insulin: Secondary | ICD-10-CM | POA: Insufficient documentation

## 2016-03-29 DIAGNOSIS — E119 Type 2 diabetes mellitus without complications: Secondary | ICD-10-CM | POA: Insufficient documentation

## 2016-03-29 DIAGNOSIS — G40209 Localization-related (focal) (partial) symptomatic epilepsy and epileptic syndromes with complex partial seizures, not intractable, without status epilepticus: Secondary | ICD-10-CM | POA: Diagnosis not present

## 2016-03-29 DIAGNOSIS — Z7982 Long term (current) use of aspirin: Secondary | ICD-10-CM | POA: Diagnosis not present

## 2016-03-29 DIAGNOSIS — Z8673 Personal history of transient ischemic attack (TIA), and cerebral infarction without residual deficits: Secondary | ICD-10-CM | POA: Diagnosis not present

## 2016-03-29 DIAGNOSIS — I2510 Atherosclerotic heart disease of native coronary artery without angina pectoris: Secondary | ICD-10-CM | POA: Diagnosis not present

## 2016-03-29 DIAGNOSIS — Z7984 Long term (current) use of oral hypoglycemic drugs: Secondary | ICD-10-CM | POA: Diagnosis not present

## 2016-03-29 DIAGNOSIS — R079 Chest pain, unspecified: Secondary | ICD-10-CM | POA: Diagnosis not present

## 2016-03-29 DIAGNOSIS — Z79899 Other long term (current) drug therapy: Secondary | ICD-10-CM | POA: Diagnosis not present

## 2016-03-29 LAB — CBC
HCT: 37.4 % (ref 36.0–46.0)
HEMOGLOBIN: 11.8 g/dL — AB (ref 12.0–15.0)
MCH: 28 pg (ref 26.0–34.0)
MCHC: 31.6 g/dL (ref 30.0–36.0)
MCV: 88.6 fL (ref 78.0–100.0)
Platelets: 234 10*3/uL (ref 150–400)
RBC: 4.22 MIL/uL (ref 3.87–5.11)
RDW: 14.6 % (ref 11.5–15.5)
WBC: 10.1 10*3/uL (ref 4.0–10.5)

## 2016-03-29 LAB — I-STAT CHEM 8, ED
BUN: 13 mg/dL (ref 6–20)
CHLORIDE: 102 mmol/L (ref 101–111)
CREATININE: 0.6 mg/dL (ref 0.44–1.00)
Calcium, Ion: 1.14 mmol/L (ref 1.12–1.23)
Glucose, Bld: 105 mg/dL — ABNORMAL HIGH (ref 65–99)
HEMATOCRIT: 38 % (ref 36.0–46.0)
Hemoglobin: 12.9 g/dL (ref 12.0–15.0)
POTASSIUM: 4.6 mmol/L (ref 3.5–5.1)
Sodium: 137 mmol/L (ref 135–145)
TCO2: 22 mmol/L (ref 0–100)

## 2016-03-29 LAB — BASIC METABOLIC PANEL
ANION GAP: 8 (ref 5–15)
BUN: 11 mg/dL (ref 6–20)
CHLORIDE: 102 mmol/L (ref 101–111)
CO2: 25 mmol/L (ref 22–32)
Calcium: 9.8 mg/dL (ref 8.9–10.3)
Creatinine, Ser: 0.74 mg/dL (ref 0.44–1.00)
GFR calc non Af Amer: >60 mL/min (ref >60)
Glucose, Bld: 107 mg/dL — ABNORMAL HIGH (ref 65–99)
Potassium: 4.4 mmol/L (ref 3.5–5.1)
Sodium: 135 mmol/L (ref 135–145)

## 2016-03-29 LAB — COMPREHENSIVE METABOLIC PANEL
ALBUMIN: 4.4 g/dL (ref 3.5–5.0)
ALT: 17 U/L (ref 14–54)
AST: 24 U/L (ref 15–41)
Alkaline Phosphatase: 73 U/L (ref 38–126)
Anion gap: 11 (ref 5–15)
BILIRUBIN TOTAL: 0.7 mg/dL (ref 0.3–1.2)
BUN: 12 mg/dL (ref 6–20)
CHLORIDE: 99 mmol/L — AB (ref 101–111)
CO2: 20 mmol/L — AB (ref 22–32)
Calcium: 9.4 mg/dL (ref 8.9–10.3)
Creatinine, Ser: 0.74 mg/dL (ref 0.44–1.00)
GFR calc Af Amer: >60 mL/min (ref >60)
GFR calc non Af Amer: >60 mL/min (ref >60)
GLUCOSE: 102 mg/dL — AB (ref 65–99)
POTASSIUM: 4.5 mmol/L (ref 3.5–5.1)
Sodium: 130 mmol/L — ABNORMAL LOW (ref 135–145)
TOTAL PROTEIN: 8.1 g/dL (ref 6.5–8.1)

## 2016-03-29 LAB — DIFFERENTIAL
BASOS ABS: 0 10*3/uL (ref 0.0–0.1)
Basophils Relative: 0 %
EOS ABS: 0.2 10*3/uL (ref 0.0–0.7)
Eosinophils Relative: 2 %
LYMPHS ABS: 2.7 10*3/uL (ref 0.7–4.0)
Lymphocytes Relative: 27 %
Monocytes Absolute: 0.9 10*3/uL (ref 0.1–1.0)
Monocytes Relative: 9 %
NEUTROS PCT: 62 %
Neutro Abs: 6.3 10*3/uL (ref 1.7–7.7)

## 2016-03-29 LAB — RAPID URINE DRUG SCREEN, HOSP PERFORMED
AMPHETAMINES: NOT DETECTED
Barbiturates: NOT DETECTED
Benzodiazepines: NOT DETECTED
COCAINE: NOT DETECTED
OPIATES: NOT DETECTED
Tetrahydrocannabinol: NOT DETECTED

## 2016-03-29 LAB — URINALYSIS, ROUTINE W REFLEX MICROSCOPIC
BILIRUBIN URINE: NEGATIVE
GLUCOSE, UA: NEGATIVE mg/dL
Hgb urine dipstick: NEGATIVE
Ketones, ur: NEGATIVE mg/dL
LEUKOCYTES UA: NEGATIVE
NITRITE: NEGATIVE
PH: 6 (ref 5.0–8.0)
Protein, ur: 100 mg/dL — AB
SPECIFIC GRAVITY, URINE: 1.01 (ref 1.005–1.030)

## 2016-03-29 LAB — PROTIME-INR
INR: 1.05
Prothrombin Time: 13.8 seconds (ref 11.4–15.2)

## 2016-03-29 LAB — APTT: APTT: 29 s (ref 24–36)

## 2016-03-29 LAB — I-STAT TROPONIN, ED
Troponin i, poc: 0 ng/mL (ref 0.00–0.08)
Troponin i, poc: 0.04 ng/mL (ref 0.00–0.08)

## 2016-03-29 LAB — URINE MICROSCOPIC-ADD ON
Bacteria, UA: NONE SEEN
RBC / HPF: NONE SEEN RBC/hpf (ref 0–5)

## 2016-03-29 LAB — ETHANOL: Alcohol, Ethyl (B): <5 mg/dL (ref <5)

## 2016-03-29 NOTE — Consult Note (Signed)
Neurology Consult Note  Reason for Consultation: CODE STROKE  Requesting provider: Arby Barrette, MD  CC: Left-sided numbness and weakness  HPI: This is a 75 year old right-handed woman who presents to the emergency department via ambulance after developing left-sided weakness and slurred speech. She is obtained from the patient. Additional information is obtained from the patient's son was present at the bedside.  It sounds as if the patient was in her usual state of health until going to church this morning. It was an emotionally stressful service because they were memorializing one of the patient's friends who passed away on 2016/04/03. While in church, the patient stated that she did not feel well. This was followed by the development of left sided weakness and slurred speech. EMS was activated and she was brought to the emergency department. She complained of chest pain with an initial blood pressure of 210/90. She was given nitro paste with improvement in her chest pain. Her left-sided weakness has fully resolved. Her speech has cleared.  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 He states that she has been having spells since November 2016 where she will experience left-sided weakness, left sided numbness, and what sounds like speech arrest versus slurred speech. He states that these generally last a couple of minutes and then resolve. Many of these episodes have been associated with chest pain and she has been admitted for evaluation for possible MI as a result. She is currently taking Keppra 750 mg twice daily and her son reports that this has dramatically reduced the frequency of her spells. He states that her episodes are typically brought on by stress. He grew more concerned today because her symptoms seemed to last longer than usual.  On review of her chart, she was seen in consultation by neurology during an admission in February 2017. 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 The patient had stated that it was severe enough that she did go to rehabilitation to "learn to walk and talk again." She has had some mild residual left-sided weakness and numbness since. It was reported in February that she had been having spells for 2 years where her left face would "draw up" and she had left arm and leg weakness accompanied by staring and speech arrest without any convulsive activity. Workup reportedly included EEGs, some of which recorded some of her spells but did not demonstrate seizure activity. She was placed empirically on Keppra and it was again noted that spells diminished on medication. Differential diagnosis that time included temporal lobe epilepsy, nonepileptic seizures, and cardiac arrhythmia. The consulting neurologist noted that MRI scan of the brain in 2016 was normal despite her report of severe stroke resulting in dense left-sided deficits. At that time her Keppra was increased to 1000 mg twice daily and she was to follow-up with her outpatient neurologist.  She had a routine EEG performed on 07/08/15 which was normal. No other EEGs are present in her [facility] records.  Last known well: 1120 NIH stroke scale score: 6 (per stroke nurse), 1 on my assessment TPA given: No, presentation likely not stroke given history outlined above  PMH:  Past Medical History:  Diagnosis Date  . Acid reflux   . Arthritis    "all over"  . Chronic stable angina (HCC)   . Coronary artery disease   . Hypercholesterolemia   . Hypertension   .  Myocardial infarction (HCC) 1991; 07/06/2015  . NSTEMI (non-ST elevated myocardial infarction) (HCC) 10/22/2015  . Psoriatic arthritis (HCC)   . 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 TIA (transient ischemic attack)    "several over the last 20 years" (10/22/2015)  . Type II  diabetes mellitus (HCC)     PSH:  Past Surgical History:  Procedure Laterality Date  . ABDOMINAL HYSTERECTOMY  1973  . APPENDECTOMY  1950s   elementary school  . CARDIAC CATHETERIZATION  X 2-3  . CARDIAC CATHETERIZATION N/A 11/21/2015   Procedure: Left Heart Cath and Coronary Angiography;  Surgeon: Corky Crafts, MD;  Location: [facility] INVASIVE CV LAB;  Service: Cardiovascular;  Laterality: N/A;  . CARDIAC CATHETERIZATION  11/21/2015   Procedure: Coronary Stent Intervention;  Surgeon: Corky Crafts, MD;  Location: [facility] INVASIVE CV LAB;  Service: Cardiovascular;;  . CARDIAC CATHETERIZATION N/A 01/29/2016   Procedure: Left Heart Cath and Cors/Grafts Angiography;  Surgeon: Tonny Bollman, MD;  Location: [facility] INVASIVE CV LAB;  Service: Cardiovascular;  Laterality: N/A;  . CAROTID STENT INSERTION Bilateral 2011-2012   right-left  . CATARACT EXTRACTION W/ INTRAOCULAR LENS  IMPLANT, BILATERAL Bilateral 2009-2010  . CHOLECYSTECTOMY OPEN  ~ 2014  . CORONARY ANGIOPLASTY    . CORONARY ANGIOPLASTY WITH STENT PLACEMENT  11/21/2015  . CORONARY ARTERY BYPASS GRAFT  08/1995  . DILATION AND CURETTAGE OF UTERUS  X 1  . 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 TONSILLECTOMY  ~ 1947   2nd grade    Family history: Family History  Problem Relation Age of Onset  . Heart attack Father   . Diabetes Mother   . Cancer Mother   . Stroke Mother     Social history:  Social History   Social History  . Marital status: Widowed    Spouse name: N/A  . Number of children: N/A  . Years of education: N/A   Occupational History  . Not on file.   Social History Main Topics  . Smoking status: Never Smoker  . Smokeless tobacco: Never Used  . Alcohol use No  . Drug use: No  . Sexual activity: No   Other Topics Concern  . Not on file   Social History Narrative  . No narrative on file      Current inpatient meds:  No current  facility-administered medications for this encounter.    Current Outpatient Prescriptions  Medication Sig Dispense Refill  . aspirin EC 81 MG tablet Take 81 mg by mouth at bedtime.    [name] atorvastatin (LIPITOR) 80 MG tablet Take 1 tablet (80 mg total) by mouth at bedtime. 30 tablet 3  . bimatoprost (LUMIGAN) 0.01 % SOLN Place 1 drop into both eyes at bedtime.    . brimonidine (ALPHAGAN) 0.2 % ophthalmic solution Place 1 drop into both eyes 2 (two) times daily.     [name] CALCIUM-MAGNESIUM-ZINC PO Take 1 tablet by mouth daily.    . clopidogrel (PLAVIX) 75 MG tablet Take 75 mg by mouth daily.    [name] glipiZIDE (GLUCOTROL XL) 10 MG 24 hr tablet Take 10 mg by mouth every morning.    . Insulin Glargine (LANTUS SOLOSTAR) 100 UNIT/ML Solostar Pen Inject 24 Units into the skin at bedtime.    . insulin lispro (HUMALOG) 100 UNIT/ML injection Inject 6-8 Units into the skin 3 (three) times daily before meals. Per sliding scale:  6 units plus:   CBG 150-200 add 1 unit, 201-250 add 2 units, 251-300 add 3 units, 301-350 add 4 units 351-400 add 5 units, >400 add 6 units and call MD    . isosorbide mononitrate (IMDUR) 30 MG 24 hr tablet Take 1 tablet (30 mg total) by mouth daily. 30 tablet 1  . levETIRAcetam (KEPPRA) 750 MG tablet Take 750 mg by mouth 2 (two) times daily.    . metFORMIN (GLUCOPHAGE) 1000 MG tablet Take 1 tablet (1,000 mg total) by mouth 2 (two) times daily.    . metoprolol tartrate (LOPRESSOR) 25 MG tablet Take 12.5 mg by mouth at bedtime.     . nitroGLYCERIN (NITROSTAT) 0.4 MG SL tablet Place 0.4 mg under the tongue every 5 (five) minutes as needed for chest pain.    [name]. nystatin cream (MYCOSTATIN) Apply 1 application topically 2 (two) times daily as needed (yeast infection).     . ranitidine (ZANTAC) 150 MG tablet Take 150 mg by mouth daily.    . timolol (BETIMOL) 0.5 % ophthalmic solution Place 1 drop into both eyes 2 (two) times daily.    [name]. triamcinolone ointment (KENALOG) 0.1 % Apply 1 application  topically 3 (three) times daily as needed (itching from psoriasis).    . vitamin B-12 (CYANOCOBALAMIN) 1000 MCG tablet Take 1,000 mcg by mouth daily.    . vitamin E 400 UNIT capsule Take 400 Units by mouth daily.      Allergies: Allergies  Allergen Reactions  . Valium [Diazepam] Other (See Comments)    Makes her feel like she's flying  . Vicodin [Hydrocodone-Acetaminophen] Nausea And Vomiting  . Isosorbide Nitrate Other (See Comments)  . Levofloxacin Other (See Comments)  . Simvastatin Other (See Comments)  . Talwin [Pentazocine] Other (See Comments)    ROS: As per HPI. A full 14-point review of systems was performed and is otherwise unremarkable.   PE:  BP 168/93   Pulse 89   Temp 97.8 F (36.6 C) (Oral)   Resp 18   Wt 69.3 kg (152 lb 12.8 oz)   SpO2 97%   BMI 27.07 kg/m   General: WDWN, no acute distress. Alert, oriented to self, hospital, day but unable to tell me the month, date, year.[name]. Speech clear, no dysarthria. No aphasia. Follows commands briskly. Affect is bright with congruent mood. She did become briefly tearful when thinking about her deceased friend, stating that she thought she was going to heaven today to be with her. Comportment is normal.  HEENT: Normocephalic. Neck supple without LAD. MMM, OP clear. Dentition good. Sclerae anicteric. No conjunctival injection.  CV: Regular, no murmur. Carotid pulses full and symmetric, no bruits. Distal pulses 2+ and symmetric.  Lungs: CTAB.  Abdomen: Soft, non-distended, non-tender. Bowel sounds present x4.  Extremities: No C/C/E. Neuro:  CN: Pupils are equal and round. They are symmetrically reactive from 3-->2 mm. EOMI without nystagmus. No reported diplopia. Facial sensation is intact to light touch. Face is symmetric at rest with normal strength and mobility. Hearing is intact to conversational voice. Palate elevates symmetrically and uvula is midline. Voice is normal in tone, pitch and quality. Bilateral SCM and trapezii  are 5/5. Tongue is midline with normal bulk and mobility.  Motor: Normal bulk, tone, and strength. No tremor or other abnormal movements. No drift.  Sensation: Light touch is decreased on the left side.  DTRs: 2+, symmetric. Toes downgoing bilaterally. No pathologic reflexes.  Coordination: Finger-to-nose and heel-to-shin are without dysmetria. Finger taps are slower  on the left than the right.   Labs:  Lab Results  Component Value Date   WBC 10.1 03/29/2016   HGB 12.9 03/29/2016   HCT 38.0 03/29/2016   PLT 234 03/29/2016   GLUCOSE 105 (H) 03/29/2016   CHOL 156 11/22/2015   TRIG 161 (H) 11/22/2015   HDL 44 11/22/2015   LDLCALC 80 11/22/2015   ALT 17 03/29/2016   AST 24 03/29/2016   NA 137 03/29/2016   K 4.6 03/29/2016   CL 102 03/29/2016   CREATININE 0.60 03/29/2016   BUN 13 03/29/2016   CO2 20 (L) 03/29/2016   TSH 2.214 01/29/2016   INR 1.05 03/29/2016   HGBA1C 6.9 (H) 01/29/2016   Troponin 0 Serum ethanol less than 5 mg/dL  Imaging:  I have personally and independently reviewed CT scan of the head without contrast from today. This shows moderate diffuse atrophy. Focal areas of hypodensity are seen in the internal capsules bilaterally. Moderate chronic small vessel change is noted.  I have personally and independently reviewed an MRI scan of the brain without contrast from 07/06/15. This shows moderate diffuse cortical atrophy. A single focal area of T2/flair hyperintensity is noted in the left frontal white matter with additional hyperintensity seen around the lateral ventricles, consistent with chronic small vessel ischemic change. No obvious encephalomalacia is present.  I have personally and independently reviewed MRA of the brain from 07/06/15. This is notable for some focal stenosis in the P1 segment of the left PCA. Per radiology interpretation this measures 50-75% stenosis.   Assessment and Plan:  1. Possible seizure: She has stereotypical spells of left-sided  weakness, left-sided numbness, and speech changes sometimes associated with chest pain. These have been evaluated extensively to date and she carries a current diagnosis of complex partial seizure. At this point, she seems to be clearing well and I do not have any suspicion that she has had an acute stroke given that this episode is identical to the ones that she has been having for the past couple of years. In addition, given that she has reportedly had extensive workup including EEGs and MRI, I do not think additional testing is likely to be beneficial. Her episodes are infrequent enough that long-term monitoring with EEG is not feasible but this would be the optimal test to help characterize her episodes further. Her son is clear that the spells have decreased in frequency since she was placed on Keppra which would support a diagnosis of seizure. Keppra should be continued at her current outpatient dosage. Seizure precautions.  2. Left hemiparesis: This is resolved at this time. This is consistent with her known spells. No specific intervention at this time.  3. Left hemisensory loss: She continues to have decreased light touch over the left side of her body. Again, this is typical for her spells. I anticipate this will continue to resolve. Her son indicates that she does not have any problems with sensation in between spells, the prior notes that suggested otherwise. Regardless, things seem to be improving today and observation is sufficient.  4. Slurred speech: This also appears to have resolved. This is another feature of her spells and can be observed.  This was discussed with the patient's son at the bedside. He is in agreement with the plan as stated. He was given the opportunity to ask any questions and these were addressed to his satisfaction.  I also discussed my findings with the ED staff. At this point, I have no additional recommendations.  She can be observed and if she clears to her usual  baseline she can likely be discharged home with follow-up with her outpatient providers.

## 2016-03-29 NOTE — ED Notes (Signed)
Pt ambulated with stand by assistance.

## 2016-03-29 NOTE — ED Provider Notes (Signed)
[facility]-EMER[facility]ENCY DEPT Provider Note   CSN: [id number] Arrival date & time: 03/29/16  1158  First Provider Contact:  None       History   Chief Complaint Chief Complaint  Patient presents with  . Code Stroke    HPI Leah Small is a 75 y.o. female.  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  She was with her son at the time.  He noticed her to have some left sided facial droop, left sided weakness, and confusion.  EMS noted that she has been intermittently confused about her location and time, but then returns to baseline.  They endorsed some left sided weakness.  She also complains of some chest pain.  Patient alert and oriented on arrival here.  Past Medical History:  Diagnosis Date  . Acid reflux   . Arthritis    "all over"  . Chronic stable angina (HCC)   . Coronary artery disease   . Hypercholesterolemia   . Hypertension   . Myocardial infarction (HCC) 1991; 07/06/2015  . NSTEMI (non-ST elevated myocardial infarction) (HCC) 10/22/2015  . Psoriatic arthritis (HCC)   . 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 TIA (transient ischemic attack)    "several over the last 20 years" (10/22/2015)  . Type II diabetes mellitus [facility])     Patient Active Problem List   Diagnosis Date Noted  . Hypertensive heart disease 01/29/2016  . Hypertensive urgency 01/29/2016  . Normocytic anemia 01/29/2016  . History of vaginal bleeding 01/29/2016  . GERD (gastroesophageal reflux disease) 01/29/2016  . NSTEMI (non-ST elevated myocardial infarction) (HCC) 10/22/2015  . Non-STEMI (non-ST elevated myocardial infarction) (HCC) 10/22/2015  . 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 Coronary atherosclerosis of native  coronary artery 10/22/2015  . Angina pectoris (HCC)   . Memory deficit   . Confusional state   . Sepsis (HCC) 07/06/2015  . Stroke (HCC) 07/06/2015  . Diabetes mellitus with complication (HCC) 07/06/2015  . Essential hypertension 07/06/2015  . HLD (hyperlipidemia) 07/06/2015  . Unstable angina (HCC) 07/06/2015  . Left-sided weakness   . Stroke-like symptoms     Past Surgical History:  Procedure Laterality Date  . ABDOMINAL HYSTERECTOMY  1973  . APPENDECTOMY  1950s   elementary school  . CARDIAC CATHETERIZATION  X 2-3  . CARDIAC CATHETERIZATION N/A 11/21/2015   Procedure: Left Heart Cath and Coronary Angiography;  Surgeon: Corky Crafts, MD;  Location: [facility] INVASIVE CV LAB;  Service: Cardiovascular;  Laterality: N/A;  . CARDIAC CATHETERIZATION  11/21/2015   Procedure: Coronary Stent Intervention;  Surgeon: Corky Crafts, MD;  Location: [facility] INVASIVE CV LAB;  Service: Cardiovascular;;  . CARDIAC CATHETERIZATION N/A 01/29/2016   Procedure: Left Heart Cath and Cors/Grafts Angiography;  Surgeon: Tonny Bollman, MD;  Location: [facility] INVASIVE CV LAB;  Service: Cardiovascular;  Laterality: N/A;  . CAROTID STENT INSERTION Bilateral 2011-2012   right-left  . CATARACT EXTRACTION W/ INTRAOCULAR LENS  IMPLANT, BILATERAL Bilateral 2009-2010  . CHOLECYSTECTOMY OPEN  ~ 2014  . CORONARY ANGIOPLASTY    . CORONARY ANGIOPLASTY WITH STENT PLACEMENT  11/21/2015  . CORONARY ARTERY BYPASS GRAFT  08/1995  . DILATION AND CURETTAGE OF UTERUS  X 1  . THROMBECTOMY FEMORAL ARTERY Right ~  2015   right femoral artery occlusion/notes 12/13/2014  . TONSILLECTOMY  ~ 1947   2nd grade    OB History    No data available       Home Medications    Prior to Admission medications   Medication Sig Start Date End Date Taking? Authorizing Provider  aspirin EC 81 MG tablet Take 81 mg by mouth at bedtime.   Yes Historical Provider, MD  atorvastatin (LIPITOR) 80 MG tablet Take 1 tablet (80 mg total) by mouth at  bedtime. 11/22/15  Yes Ripudeep K Rai, MD  bimatoprost (LUMIGAN) 0.01 % SOLN Place 1 drop into both eyes at bedtime.   Yes Historical Provider, MD  brimonidine (ALPHAGAN) 0.2 % ophthalmic solution Place 1 drop into both eyes 2 (two) times daily.    Yes Historical Provider, MD  CALCIUM-MAGNESIUM-ZINC PO Take 1 tablet by mouth every morning.    Yes Historical Provider, MD  clopidogrel (PLAVIX) 75 MG tablet Take 75 mg by mouth every morning.    Yes Historical Provider, MD  gabapentin (NEURONTIN) 100 MG capsule Take 100-200 mg by mouth at bedtime.  03/26/16  Yes Historical Provider, MD  glipiZIDE (GLUCOTROL XL) 10 MG 24 hr tablet Take 10 mg by mouth every morning. 04/26/15  Yes Historical Provider, MD  Insulin Glargine (LANTUS SOLOSTAR) 100 UNIT/ML Solostar Pen Inject 24 Units into the skin at bedtime.   Yes Historical Provider, MD  insulin lispro (HUMALOG) 100 UNIT/ML injection Inject 6-8 Units into the skin 3 (three) times daily before meals. Per sliding scale: 6 units plus:   CBG 150-200 add 1 unit, 201-250 add 2 units, 251-300 add 3 units, 301-350 add 4 units 351-400 add 5 units, >400 add 6 units and call MD   Yes Historical Provider, MD  isosorbide mononitrate (IMDUR) 60 MG 24 hr tablet Take 60 mg by mouth every evening. 03/21/16  Yes Historical Provider, MD  levETIRAcetam (KEPPRA) 750 MG tablet Take 750 mg by mouth 2 (two) times daily.   Yes Historical Provider, MD  metFORMIN (GLUCOPHAGE) 1000 MG tablet Take 1 tablet (1,000 mg total) by mouth 2 (two) times daily. 01/30/16  Yes Ok Anishristopher R Berge, NP  metoprolol tartrate (LOPRESSOR) 25 MG tablet Take 12.5 mg by mouth at bedtime.    Yes Historical Provider, MD  nitroGLYCERIN (NITRODUR - DOSED IN MG/24 HR) 0.4 mg/hr patch Place 0.4 mg onto the skin daily as needed (for chest pain).  02/19/16  Yes Historical Provider, MD  nitroGLYCERIN (NITROSTAT) 0.4 MG SL tablet Place 0.4 mg under the tongue every 5 (five) minutes as needed for chest pain.   Yes Historical  Provider, MD  nystatin cream (MYCOSTATIN) Apply 1 application topically 2 (two) times daily as needed (yeast infection).  04/25/15  Yes Historical Provider, MD  ranitidine (ZANTAC) 150 MG tablet Take 150 mg by mouth every morning.    Yes Historical Provider, MD  timolol (BETIMOL) 0.5 % ophthalmic solution Place 1 drop into both eyes 2 (two) times daily.   Yes Historical Provider, MD  triamcinolone ointment (KENALOG) 0.1 % Apply 1 application topically 3 (three) times daily as needed (itching from psoriasis).   Yes Historical Provider, MD  vitamin B-12 (CYANOCOBALAMIN) 1000 MCG tablet Take 1,000 mcg by mouth every morning.    Yes Historical Provider, MD  vitamin E 400 UNIT capsule Take 400 Units by mouth every morning.    Yes Historical Provider, MD    Family History Family History  Problem Relation Age of Onset  .  Heart attack Father   . Diabetes Mother   . Cancer Mother   . Stroke Mother     Social History Social History  Substance Use Topics  . Smoking status: Never Smoker  . Smokeless tobacco: Never Used  . Alcohol use No     Allergies   Talwin [pentazocine]; Valium [diazepam]; Vicodin [hydrocodone-acetaminophen]; Adhesive [tape]; Levofloxacin; and Simvastatin   Review of Systems Review of Systems  Constitutional: Negative for chills and fever.  HENT: Negative for ear pain and sore throat.   Eyes: Negative for pain and visual disturbance.  Respiratory: Negative for cough and shortness of breath.   Cardiovascular: Positive for chest pain. Negative for palpitations.  Gastrointestinal: Negative for abdominal pain and vomiting.  Genitourinary: Negative for dysuria and hematuria.  Musculoskeletal: Negative for arthralgias and back pain.  Skin: Negative for color change and rash.  Neurological: Positive for weakness. Negative for seizures and syncope.  Psychiatric/Behavioral: Positive for confusion.  All other systems reviewed and are negative.    Physical Exam Updated  Vital Signs BP 134/75   Pulse 68   Temp 98.7 F (37.1 C)   Resp 25   Wt 69.3 kg   SpO2 98%   BMI 27.07 kg/m   Physical Exam  Constitutional: She is oriented to person, place, and time. She appears well-developed and well-nourished. No distress.  HENT:  Head: Normocephalic and atraumatic.  Eyes: Conjunctivae are normal.  Neck: Neck supple.  Cardiovascular: Normal rate and regular rhythm.   No murmur heard. Pulmonary/Chest: Effort normal and breath sounds normal. No respiratory distress.  Abdominal: Soft. There is no tenderness.  Musculoskeletal: She exhibits no edema.  Neurological: She is alert and oriented to person, place, and time.  Alert and oriented CN II-XII grossly intact No facial droop Eyes: PERRL, EOMI Left UE strength 4/5 Right UE strength 5 / 5 Left LE strength 3/5 Right LE strength 5/5 DTRs symmetric Intact heel-to-shin     Skin: Skin is warm and dry.  Psychiatric: She has a normal mood and affect.  Nursing note and vitals reviewed.    ED Treatments / Results  Labs (all labs ordered are listed, but only abnormal results are displayed) Labs Reviewed  CBC - Abnormal; Notable for the following:       Result Value   Hemoglobin 11.8 (*)    All other components within normal limits  COMPREHENSIVE METABOLIC PANEL - Abnormal; Notable for the following:    Sodium 130 (*)    Chloride 99 (*)    CO2 20 (*)    Glucose, Bld 102 (*)    All other components within normal limits  URINALYSIS, ROUTINE W REFLEX MICROSCOPIC (NOT AT [facility]) - Abnormal; Notable for the following:    Protein, ur 100 (*)    All other components within normal limits  BASIC METABOLIC PANEL - Abnormal; Notable for the following:    Glucose, Bld 107 (*)    All other components within normal limits  URINE MICROSCOPIC-ADD ON - Abnormal; Notable for the following:    Squamous Epithelial / LPF 0-5 (*)    All other components within normal limits  I-STAT CHEM 8, ED - Abnormal; Notable for  the following:    Glucose, Bld 105 (*)    All other components within normal limits  PROTIME-INR  APTT  DIFFERENTIAL  ETHANOL  URINE RAPID DRUG SCREEN, HOSP PERFORMED  I-STAT TROPOININ, ED  I-STAT CHEM 8, ED  I-STAT TROPOININ, ED  Rosezena Sensor, ED  EKG  EKG Interpretation  Date/Time:  Sunday March 29 2016 12:29:26 EDT Ventricular Rate:  83 PR Interval:    QRS Duration: 119 QT Interval:  391 QTC Calculation: 460 R Axis:   -68 Text Interpretation:  Sinus arrhythmia Left anterior fascicular block LVH with secondary repolarization abnormality Anterior Q waves, possibly due to LVH agree. similar to previous Confirmed by Donnald Garre, MD, Lebron Conners [phone]) on 03/29/2016 12:44:53 PM       Radiology Ct Head Code Stroke W/o Cm  Result Date: 03/29/2016 CLINICAL DATA:  Code stroke. Slurred speech and left-sided weakness. EXAM: CT HEAD WITHOUT CONTRAST TECHNIQUE: Contiguous axial images were obtained from the base of the skull through the vertex without intravenous contrast. COMPARISON:  07/06/2015 brain MRI and CT FINDINGS: There is no evidence of acute cortical infarct, intracranial hemorrhage, mass, midline shift, or extra-axial fluid collection. Cerebral atrophy is unchanged. Prior bilateral cataract extraction is noted. There is mild mucosal thickening in the left greater than right ethmoid and maxillary sinuses. There are trace mastoid effusions. No acute osseous abnormality is identified. Calcified atherosclerosis is noted at the skullbase. ASPECTS [facility] Stroke Program Early CT Score, [email or web address].[email or web address]) - Ganglionic level infarction (caudate, lentiform nuclei, internal capsule, insula, M1-M3 cortex): 7 - Supraganglionic infarction (M4-M6 cortex): 3 Total score (0-10 with 10 being normal): 10 IMPRESSION: 1. No evidence of acute intracranial abnormality. 2. ASPECTS score 10. These results were called by telephone at the time of interpretation on 03/29/2016 at 12:23 pm to Dr. Roxy Manns,  who verbally acknowledged these results. Electronically Signed   By: Sebastian Ache M.D.   On: 03/29/2016 12:25    Procedures Procedures (including critical care time)  Medications Ordered in ED Medications - No data to display   Initial Impression / Assessment and Plan / ED Course  I have reviewed the triage vital signs and the nursing notes.  Pertinent labs & imaging results that were available during my care of the patient were reviewed by me and considered in my medical decision making (see chart for details).  Clinical Course    Patient presents as code stroke.  She was immediately evaluated with CT head and neurology came to evaluate.    She has a history of partial complex seizures with similar presentation, and based on my exam here, and the story of confusion, similar aura, and similar presentation to prior seizures, I believe this is more likely to be seizure than CVA or TIA.  Neurology agreed with this assessment.  Regarding the complaint of chest pain, she has unstable angina at baseline, and recent NSTEMI.  Trop here negative x2 .  EKG NSR with some anterior elevations / inferior depressions similar to prior EKG as recent as 1 month ago.    3:32 PM on reassessment of this patient, she continues to A&Ox4.  She now has 5/5 strength in BUE and BLE, significantly improved from before.  He son and reports she is at mental baseline.  Chest pain is 4/10, which she says is normal for her.  3:32 PM Troponin negative x2.  Patient at mental baseline.  Ambulatory without difficulty.  Impression is baseline angina, complex seizure.  We have discussed the discharge plan, including the plan for outpatient followup, and strict return precautions, including those that would require calling 911.     Final Clinical Impressions(s) / ED Diagnoses   Final diagnoses:  Partial symptomatic epilepsy with complex partial seizures, not intractable, without status epilepticus (HCC)  Chest pain,  unspecified chest pain type  New Prescriptions New Prescriptions   No medications on file     Marcelina Morel, MD 03/29/16 1532    Arby Barrette, MD 04/01/16 2027

## 2016-03-29 NOTE — ED Triage Notes (Addendum)
[facility] here via [facility] from [facility] for code stroke. LSN 1120.  Pt stated she did not feel well and began experiencing L sided deficits and slurred speech.  Pt has hx of partial/complex seizures that mimic stroke and had stroke in Nov that had same seizures (just out of rehab 1 month ago).  Pt c/o chest pain and intial bp was 210/90.  Given 2 " nitro pase with decrease in bp to 186/104.  CBG 100.

## 2016-03-29 NOTE — ED Notes (Signed)
Code Stroke page went out @ 1137.

## 2016-04-03 ENCOUNTER — Other Ambulatory Visit: Payer: Self-pay | Admitting: Internal Medicine

## 2016-04-03 DIAGNOSIS — Z1231 Encounter for screening mammogram for malignant neoplasm of breast: Secondary | ICD-10-CM

## 2016-06-02 ENCOUNTER — Other Ambulatory Visit: Payer: Self-pay | Admitting: Internal Medicine

## 2016-06-02 ENCOUNTER — Ambulatory Visit
Admission: RE | Admit: 2016-06-02 | Discharge: 2016-06-02 | Disposition: A | Discharge status: Home / Self Care | Payer: Medicare Other | Source: Ambulatory Visit | Attending: Internal Medicine | Admitting: Internal Medicine

## 2016-06-02 DIAGNOSIS — Z1231 Encounter for screening mammogram for malignant neoplasm of breast: Secondary | ICD-10-CM | POA: Diagnosis not present

## 2016-06-19 ENCOUNTER — Other Ambulatory Visit: Payer: Self-pay | Admitting: Cardiovascular Disease

## 2016-06-22 NOTE — Telephone Encounter (Signed)
Rx(s) sent to pharmacy electronically.  

## 2016-07-21 ENCOUNTER — Emergency Department (HOSPITAL_COMMUNITY): Payer: Medicare Other

## 2016-07-21 ENCOUNTER — Encounter (HOSPITAL_COMMUNITY): Payer: Self-pay

## 2016-07-21 ENCOUNTER — Inpatient Hospital Stay (HOSPITAL_COMMUNITY)
Admission: EM | Admit: 2016-07-21 | Discharge: 2016-07-23 | DRG: 287 | Disposition: A | Discharge status: Home / Self Care | Payer: Medicare Other | Attending: Cardiovascular Disease | Admitting: Cardiovascular Disease

## 2016-07-21 DIAGNOSIS — I255 Ischemic cardiomyopathy: Secondary | ICD-10-CM | POA: Diagnosis present

## 2016-07-21 DIAGNOSIS — E119 Type 2 diabetes mellitus without complications: Secondary | ICD-10-CM | POA: Diagnosis present

## 2016-07-21 DIAGNOSIS — Z90710 Acquired absence of both cervix and uterus: Secondary | ICD-10-CM

## 2016-07-21 DIAGNOSIS — Z885 Allergy status to narcotic agent status: Secondary | ICD-10-CM

## 2016-07-21 DIAGNOSIS — I252 Old myocardial infarction: Secondary | ICD-10-CM

## 2016-07-21 DIAGNOSIS — E782 Mixed hyperlipidemia: Secondary | ICD-10-CM | POA: Diagnosis not present

## 2016-07-21 DIAGNOSIS — Z888 Allergy status to other drugs, medicaments and biological substances status: Secondary | ICD-10-CM | POA: Diagnosis not present

## 2016-07-21 DIAGNOSIS — Z8673 Personal history of transient ischemic attack (TIA), and cerebral infarction without residual deficits: Secondary | ICD-10-CM

## 2016-07-21 DIAGNOSIS — E785 Hyperlipidemia, unspecified: Secondary | ICD-10-CM | POA: Diagnosis present

## 2016-07-21 DIAGNOSIS — I200 Unstable angina: Secondary | ICD-10-CM | POA: Diagnosis present

## 2016-07-21 DIAGNOSIS — I2510 Atherosclerotic heart disease of native coronary artery without angina pectoris: Secondary | ICD-10-CM | POA: Diagnosis present

## 2016-07-21 DIAGNOSIS — Z794 Long term (current) use of insulin: Secondary | ICD-10-CM

## 2016-07-21 DIAGNOSIS — Z66 Do not resuscitate: Secondary | ICD-10-CM | POA: Diagnosis present

## 2016-07-21 DIAGNOSIS — Z951 Presence of aortocoronary bypass graft: Secondary | ICD-10-CM | POA: Diagnosis not present

## 2016-07-21 DIAGNOSIS — Z9109 Other allergy status, other than to drugs and biological substances: Secondary | ICD-10-CM

## 2016-07-21 DIAGNOSIS — Z955 Presence of coronary angioplasty implant and graft: Secondary | ICD-10-CM | POA: Diagnosis not present

## 2016-07-21 DIAGNOSIS — Z79899 Other long term (current) drug therapy: Secondary | ICD-10-CM

## 2016-07-21 DIAGNOSIS — I160 Hypertensive urgency: Secondary | ICD-10-CM | POA: Diagnosis present

## 2016-07-21 DIAGNOSIS — Z881 Allergy status to other antibiotic agents status: Secondary | ICD-10-CM | POA: Diagnosis not present

## 2016-07-21 DIAGNOSIS — I959 Hypotension, unspecified: Secondary | ICD-10-CM | POA: Diagnosis not present

## 2016-07-21 DIAGNOSIS — I25110 Atherosclerotic heart disease of native coronary artery with unstable angina pectoris: Secondary | ICD-10-CM | POA: Diagnosis not present

## 2016-07-21 DIAGNOSIS — L4050 Arthropathic psoriasis, unspecified: Secondary | ICD-10-CM | POA: Diagnosis present

## 2016-07-21 DIAGNOSIS — Z833 Family history of diabetes mellitus: Secondary | ICD-10-CM

## 2016-07-21 DIAGNOSIS — Z8249 Family history of ischemic heart disease and other diseases of the circulatory system: Secondary | ICD-10-CM | POA: Diagnosis not present

## 2016-07-21 DIAGNOSIS — K219 Gastro-esophageal reflux disease without esophagitis: Secondary | ICD-10-CM | POA: Diagnosis present

## 2016-07-21 DIAGNOSIS — I471 Supraventricular tachycardia: Secondary | ICD-10-CM | POA: Diagnosis not present

## 2016-07-21 DIAGNOSIS — E7800 Pure hypercholesterolemia, unspecified: Secondary | ICD-10-CM | POA: Diagnosis not present

## 2016-07-21 DIAGNOSIS — E118 Type 2 diabetes mellitus with unspecified complications: Secondary | ICD-10-CM

## 2016-07-21 DIAGNOSIS — G40209 Localization-related (focal) (partial) symptomatic epilepsy and epileptic syndromes with complex partial seizures, not intractable, without status epilepticus: Secondary | ICD-10-CM | POA: Diagnosis present

## 2016-07-21 DIAGNOSIS — Z7982 Long term (current) use of aspirin: Secondary | ICD-10-CM

## 2016-07-21 DIAGNOSIS — Z7952 Long term (current) use of systemic steroids: Secondary | ICD-10-CM

## 2016-07-21 DIAGNOSIS — I10 Essential (primary) hypertension: Secondary | ICD-10-CM

## 2016-07-21 DIAGNOSIS — Z823 Family history of stroke: Secondary | ICD-10-CM

## 2016-07-21 DIAGNOSIS — Z7902 Long term (current) use of antithrombotics/antiplatelets: Secondary | ICD-10-CM

## 2016-07-21 LAB — DIFFERENTIAL
Basophils Absolute: 0 10*3/uL (ref 0.0–0.1)
Basophils Relative: 0 %
EOS ABS: 0.2 10*3/uL (ref 0.0–0.7)
EOS PCT: 3 %
Lymphocytes Relative: 35 %
Lymphs Abs: 2.4 10*3/uL (ref 0.7–4.0)
MONO ABS: 0.6 10*3/uL (ref 0.1–1.0)
Monocytes Relative: 9 %
NEUTROS PCT: 53 %
Neutro Abs: 3.6 10*3/uL (ref 1.7–7.7)

## 2016-07-21 LAB — COMPREHENSIVE METABOLIC PANEL
ALK PHOS: 54 U/L (ref 38–126)
ALT: 18 U/L (ref 14–54)
ANION GAP: 11 (ref 5–15)
AST: 28 U/L (ref 15–41)
Albumin: 4.1 g/dL (ref 3.5–5.0)
BILIRUBIN TOTAL: 0.8 mg/dL (ref 0.3–1.2)
BUN: 10 mg/dL (ref 6–20)
CALCIUM: 9.2 mg/dL (ref 8.9–10.3)
CO2: 23 mmol/L (ref 22–32)
Chloride: 102 mmol/L (ref 101–111)
Creatinine, Ser: 0.73 mg/dL (ref 0.44–1.00)
GFR calc Af Amer: >60 mL/min (ref >60)
GLUCOSE: 165 mg/dL — AB (ref 65–99)
POTASSIUM: 4.2 mmol/L (ref 3.5–5.1)
Sodium: 136 mmol/L (ref 135–145)
TOTAL PROTEIN: 7.2 g/dL (ref 6.5–8.1)

## 2016-07-21 LAB — TROPONIN I: Troponin I: <0.03 ng/mL (ref <0.03)

## 2016-07-21 LAB — CBC
HCT: 33.3 % — ABNORMAL LOW (ref 36.0–46.0)
Hemoglobin: 10.7 g/dL — ABNORMAL LOW (ref 12.0–15.0)
MCH: 27.5 pg (ref 26.0–34.0)
MCHC: 32.1 g/dL (ref 30.0–36.0)
MCV: 85.6 fL (ref 78.0–100.0)
PLATELETS: 222 10*3/uL (ref 150–400)
RBC: 3.89 MIL/uL (ref 3.87–5.11)
RDW: 15.4 % (ref 11.5–15.5)
WBC: 6.8 10*3/uL (ref 4.0–10.5)

## 2016-07-21 LAB — PROTIME-INR
INR: 1.1
PROTHROMBIN TIME: 14.2 s (ref 11.4–15.2)

## 2016-07-21 LAB — LIPID PANEL
Cholesterol: 156 mg/dL (ref 0–200)
HDL: 49 mg/dL (ref >40)
LDL CALC: 60 mg/dL (ref 0–99)
Total CHOL/HDL Ratio: 3.2 RATIO
Triglycerides: 235 mg/dL — ABNORMAL HIGH (ref <150)
VLDL: 47 mg/dL — ABNORMAL HIGH (ref 0–40)

## 2016-07-21 LAB — APTT: aPTT: 31 seconds (ref 24–36)

## 2016-07-21 LAB — I-STAT TROPONIN, ED: Troponin i, poc: 0.01 ng/mL (ref 0.00–0.08)

## 2016-07-21 MED ORDER — SODIUM CHLORIDE 0.9 % IV SOLN: 250.0000 mL | INTRAVENOUS | Status: DC | PRN

## 2016-07-21 MED ORDER — ATORVASTATIN CALCIUM 80 MG PO TABS
80.0000 mg | ORAL_TABLET | Freq: Every day | ORAL | Status: DC
  Administered 2016-07-22: 80 mg via ORAL
  Filled 2016-07-21: qty 1

## 2016-07-21 MED ORDER — CLOPIDOGREL BISULFATE 75 MG PO TABS
75.0000 mg | ORAL_TABLET | Freq: Every day | ORAL | Status: DC
Start: 2016-07-22 — End: 2016-07-23
  Administered 2016-07-22 – 2016-07-23 (×2): 75 mg via ORAL
  Filled 2016-07-21 (×2): qty 1

## 2016-07-21 MED ORDER — ASPIRIN 300 MG RE SUPP: 300.0000 mg | RECTAL | Status: AC

## 2016-07-21 MED ORDER — TIMOLOL HEMIHYDRATE 0.5 % OP SOLN: 1.0000 [drp] | Freq: Two times a day (BID) | OPHTHALMIC | Status: DC

## 2016-07-21 MED ORDER — INSULIN GLARGINE 100 UNIT/ML ~~LOC~~ SOLN
24.0000 [IU] | Freq: Every day | SUBCUTANEOUS | Status: DC
  Administered 2016-07-22: 24 [IU] via SUBCUTANEOUS
  Filled 2016-07-21 (×3): qty 0.24

## 2016-07-21 MED ORDER — ASPIRIN 81 MG PO CHEW: 324.0000 mg | CHEWABLE_TABLET | ORAL | Status: AC

## 2016-07-21 MED ORDER — NITROGLYCERIN IN D5W 200-5 MCG/ML-% IV SOLN
0.0000 ug/min | Freq: Once | INTRAVENOUS | Status: AC
  Administered 2016-07-21: 5 ug/min via INTRAVENOUS
  Filled 2016-07-21: qty 250

## 2016-07-21 MED ORDER — GABAPENTIN 100 MG PO CAPS
100.0000 mg | ORAL_CAPSULE | Freq: Every day | ORAL | Status: DC
  Filled 2016-07-21: qty 1

## 2016-07-21 MED ORDER — SODIUM CHLORIDE 0.9% FLUSH: 3.0000 mL | Freq: Two times a day (BID) | INTRAVENOUS | Status: DC

## 2016-07-21 MED ORDER — ASPIRIN EC 81 MG PO TBEC: 81.0000 mg | DELAYED_RELEASE_TABLET | Freq: Every day | ORAL | Status: DC

## 2016-07-21 MED ORDER — NITROGLYCERIN 0.4 MG SL SUBL: 0.4000 mg | SUBLINGUAL_TABLET | SUBLINGUAL | Status: DC | PRN

## 2016-07-21 MED ORDER — METOPROLOL TARTRATE 12.5 MG HALF TABLET
12.5000 mg | ORAL_TABLET | Freq: Every day | ORAL | Status: DC
  Administered 2016-07-21: 12.5 mg via ORAL
  Filled 2016-07-21: qty 1

## 2016-07-21 MED ORDER — ONDANSETRON HCL 4 MG/2ML IJ SOLN: 4.0000 mg | Freq: Four times a day (QID) | INTRAMUSCULAR | Status: DC | PRN

## 2016-07-21 MED ORDER — LEVETIRACETAM 750 MG PO TABS
750.0000 mg | ORAL_TABLET | Freq: Two times a day (BID) | ORAL | Status: DC
  Administered 2016-07-21 – 2016-07-23 (×4): 750 mg via ORAL
  Filled 2016-07-21 (×4): qty 1

## 2016-07-21 MED ORDER — TRIAMCINOLONE ACETONIDE 0.1 % EX OINT: 1.0000 "application " | TOPICAL_OINTMENT | Freq: Three times a day (TID) | CUTANEOUS | Status: DC | PRN

## 2016-07-21 MED ORDER — GLIPIZIDE ER 10 MG PO TB24
10.0000 mg | ORAL_TABLET | Freq: Every morning | ORAL | Status: DC
  Administered 2016-07-23: 10 mg via ORAL
  Filled 2016-07-21: qty 1

## 2016-07-21 MED ORDER — LATANOPROST 0.005 % OP SOLN
1.0000 [drp] | Freq: Every day | OPHTHALMIC | Status: DC
  Administered 2016-07-22: 1 [drp] via OPHTHALMIC
  Filled 2016-07-21: qty 2.5

## 2016-07-21 MED ORDER — HEPARIN BOLUS VIA INFUSION
4000.0000 [IU] | Freq: Once | INTRAVENOUS | Status: AC
  Administered 2016-07-21: 4000 [IU] via INTRAVENOUS
  Filled 2016-07-21: qty 4000

## 2016-07-21 MED ORDER — ASPIRIN 81 MG PO CHEW
81.0000 mg | CHEWABLE_TABLET | ORAL | Status: AC
  Administered 2016-07-22: 81 mg via ORAL
  Filled 2016-07-21: qty 1

## 2016-07-21 MED ORDER — ACETAMINOPHEN 325 MG PO TABS
650.0000 mg | ORAL_TABLET | ORAL | Status: DC | PRN
Start: 2016-07-21 — End: 2016-07-23

## 2016-07-21 MED ORDER — ASPIRIN EC 81 MG PO TBEC
81.0000 mg | DELAYED_RELEASE_TABLET | Freq: Every day | ORAL | Status: DC
  Administered 2016-07-23: 81 mg via ORAL
  Filled 2016-07-21: qty 1

## 2016-07-21 MED ORDER — FAMOTIDINE 20 MG PO TABS
20.0000 mg | ORAL_TABLET | Freq: Every day | ORAL | Status: DC
  Administered 2016-07-22 – 2016-07-23 (×2): 20 mg via ORAL
  Filled 2016-07-21 (×2): qty 1

## 2016-07-21 MED ORDER — MORPHINE SULFATE (PF) 2 MG/ML IV SOLN
2.0000 mg | INTRAVENOUS | Status: DC | PRN
  Administered 2016-07-21: 2 mg via INTRAVENOUS

## 2016-07-21 MED ORDER — HEPARIN (PORCINE) IN NACL 100-0.45 UNIT/ML-% IJ SOLN
1100.0000 [IU]/h | INTRAMUSCULAR | Status: DC
  Administered 2016-07-21: 1100 [IU]/h via INTRAVENOUS
  Filled 2016-07-21: qty 250

## 2016-07-21 MED ORDER — SODIUM CHLORIDE 0.9 % WEIGHT BASED INFUSION
3.0000 mL/kg/h | INTRAVENOUS | Status: DC
  Administered 2016-07-22: 3 mL/kg/h via INTRAVENOUS

## 2016-07-21 MED ORDER — SODIUM CHLORIDE 0.9 % IV SOLN
10.0000 mL/h | INTRAVENOUS | Status: DC
  Administered 2016-07-21: 20 mL/h via INTRAVENOUS

## 2016-07-21 MED ORDER — INSULIN GLARGINE 100 UNIT/ML SOLOSTAR PEN: 24.0000 [IU] | PEN_INJECTOR | Freq: Every day | SUBCUTANEOUS | Status: DC

## 2016-07-21 MED ORDER — TIMOLOL MALEATE 0.5 % OP SOLN
1.0000 [drp] | Freq: Two times a day (BID) | OPHTHALMIC | Status: DC
  Administered 2016-07-22 – 2016-07-23 (×3): 1 [drp] via OPHTHALMIC
  Filled 2016-07-21: qty 5

## 2016-07-21 MED ORDER — HYDRALAZINE HCL 20 MG/ML IJ SOLN
15.0000 mg | Freq: Once | INTRAMUSCULAR | Status: AC
  Administered 2016-07-21: 15 mg via INTRAVENOUS
  Filled 2016-07-21: qty 1

## 2016-07-21 MED ORDER — MORPHINE SULFATE (PF) 2 MG/ML IV SOLN
INTRAVENOUS | Status: AC
  Filled 2016-07-21: qty 1

## 2016-07-21 MED ORDER — SODIUM CHLORIDE 0.9 % WEIGHT BASED INFUSION
1.0000 mL/kg/h | INTRAVENOUS | Status: DC
  Administered 2016-07-22: 1 mL/kg/h via INTRAVENOUS

## 2016-07-21 MED ORDER — SODIUM CHLORIDE 0.9% FLUSH: 3.0000 mL | INTRAVENOUS | Status: DC | PRN

## 2016-07-21 MED ORDER — METFORMIN HCL 500 MG PO TABS: 1000.0000 mg | ORAL_TABLET | Freq: Two times a day (BID) | ORAL | Status: DC

## 2016-07-21 MED ORDER — BRIMONIDINE TARTRATE 0.2 % OP SOLN
1.0000 [drp] | Freq: Two times a day (BID) | OPHTHALMIC | Status: DC
  Administered 2016-07-22 – 2016-07-23 (×3): 1 [drp] via OPHTHALMIC
  Filled 2016-07-21: qty 5

## 2016-07-21 NOTE — H&P (Signed)
Chief Complaint:  Accelerated angina, angina at rest  Cardiologist: Nehemiah Massed  HPI:  This is a 75 y.o. female with a past medical history significant for extensive coronary artery disease with remote bypass surgery and occluded native artery and saphenous vein grafts with residual patency of LIMA to LAD bypass and placement of a drug-eluting stent to the ramus intermedius artery in March 2017 (2.25 x 14 resolute), also has type 2 diabetes mellitus, hypercholesterolemia, psoriasis and psoriatic arthritis, complex partial seizures, gastroesophageal reflux disease. She has moderate ischemic cardiomyopathy with an estimated ejection fraction of 35-45 percent primarily due to inferior wall akinesis.  She had intractable angina with severe limitation of functional status (able to walk no more than 30 feet) prior to placement of her stent in March. She had remarkable improvement in her activity tolerance following placement of the stent and was able to tolerate walking more than 150 feet and participate in activities with her family lasting several hours. She did have some recurrent symptoms in June and underwent repeat coronary angiography which showed patency of the stent placed 3 months earlier. Over the last couple of months she has had recurrent exercise intolerance and increasing frequency of angina pectoris. She saw her usual cardiologist, Dr. Nehemiah Massed earlier today and expressed these complaints, with plans made for outpatient cardiac testing.   After she returned home she experienced chest discomfort that woke her from sleep and not remit even after 3 sublingual nitroglycerin tablets and the nitroglycerin patch. She therefore came to the emergency room. She is now asymptomatic on a nitroglycerin intravenous infusion. The initial cardiac troponin is normal. Her electrocardiogram shows marked ST segment depression in the anterolateral leads but this is a chronic abnormality.  When she feels well her  blood pressure is under excellent control, typically in the range of 120/60. Recently attempts have been made to escalate her oral antianginals and she has had a few episodes of symptomatic hypotension but without syncope. She has had systolic blood pressure recorded as low as 85 mmHg. In the past she did take a Ranexa, but was unable to continue this medication due to cost. Her son Elenore Rota is a paramedic. These are in the emergency room today.  PMHx:  Past Medical History:  Diagnosis Date  . Acid reflux   . Arthritis    "all over"  . Chronic stable angina ([place])   . Coronary artery disease   . Hypercholesterolemia   . Hypertension   . Myocardial infarction 1991; 07/06/2015  . NSTEMI (non-ST elevated myocardial infarction) ([place]) 10/22/2015  . Psoriatic arthritis ([place])   . Seizures ([place])    "complex partial; 1st one was 07/06/2015; might have had one today" (10/22/2015)  . TIA (transient ischemic attack)    "several over the last 20 years" (10/22/2015)  . Type II diabetes mellitus ([place])     Past Surgical History:  Procedure Laterality Date  . ABDOMINAL HYSTERECTOMY  1973  . APPENDECTOMY  1950s   elementary school  . CARDIAC CATHETERIZATION  X 2-3  . CARDIAC CATHETERIZATION N/A 11/21/2015   Procedure: Left Heart Cath and Coronary Angiography;  Surgeon: Jettie Booze, MD;  Location: [facility];  Service: Cardiovascular;  Laterality: N/A;  . CARDIAC CATHETERIZATION  11/21/2015   Procedure: Coronary Stent Intervention;  Surgeon: Jettie Booze, MD;  Location: [facility];  Service: Cardiovascular;;  . CARDIAC CATHETERIZATION N/A 01/29/2016   Procedure: Left Heart Cath and Cors/Grafts Angiography;  Surgeon: Sherren Mocha, MD;  Location:  [facility];  Service: Cardiovascular;  Laterality: N/A;  . CAROTID STENT INSERTION Bilateral 2011-2012   right-left  . CATARACT EXTRACTION W/ INTRAOCULAR LENS  IMPLANT, BILATERAL Bilateral 2009-2010  . CHOLECYSTECTOMY OPEN  ~  2014  . CORONARY ANGIOPLASTY    . CORONARY ANGIOPLASTY WITH STENT PLACEMENT  11/21/2015  . CORONARY ARTERY BYPASS GRAFT  08/1995  . DILATION AND CURETTAGE OF UTERUS  X 1  . 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 TONSILLECTOMY  ~ 1947   2nd grade    FAMHx:  Family History  Problem Relation Age of Onset  . Heart attack Father   . Diabetes Mother   . Cancer Mother   . Stroke Mother   . Breast cancer Cousin     SOCHx:   reports that she has never smoked. She has never used smokeless tobacco. She reports that she does not drink alcohol or use drugs.  ALLERGIES:  Allergies  Allergen Reactions  . Talwin [Pentazocine] Anaphylaxis    Throat swelling  . Valium [Diazepam] Other (See Comments)    Makes her feel like she's flying  . Vicodin [Hydrocodone-Acetaminophen] Nausea And Vomiting  . Adhesive [Tape] Other (See Comments)    Bruising, Please use "paper" tape  . Levofloxacin Other (See Comments)  . Simvastatin Swelling and Rash    Mouth swelling    ROS: Pertinent items noted in HPI and remainder of comprehensive ROS otherwise negative.  HOME MEDS:  (Not in a hospital admission)  LABS/IMAGING: Results for orders placed or performed during the hospital encounter of 07/21/16 (from the past 48 hour(s))  CBC     Status: Abnormal   Collection Time: 07/21/16  5:35 PM  Result Value Ref Range   WBC 6.8 4.0 - 10.5 K/uL   RBC 3.89 3.87 - 5.11 MIL/uL   Hemoglobin 10.7 (L) 12.0 - 15.0 g/dL   HCT 33.3 (L) 36.0 - 46.0 %   MCV 85.6 78.0 - 100.0 fL   MCH 27.5 26.0 - 34.0 pg   MCHC 32.1 30.0 - 36.0 g/dL   RDW 15.4 11.5 - 15.5 %   Platelets 222 150 - 400 K/uL  Differential     Status: None   Collection Time: 07/21/16  5:35 PM  Result Value Ref Range   Neutrophils Relative % 53 %   Neutro Abs 3.6 1.7 - 7.7 K/uL   Lymphocytes Relative 35 %   Lymphs Abs 2.4 0.7 - 4.0 K/uL   Monocytes Relative 9 %   Monocytes Absolute 0.6 0.1 -  1.0 K/uL   Eosinophils Relative 3 %   Eosinophils Absolute 0.2 0.0 - 0.7 K/uL   Basophils Relative 0 %   Basophils Absolute 0.0 0.0 - 0.1 K/uL  Protime-INR     Status: None   Collection Time: 07/21/16  5:35 PM  Result Value Ref Range   Prothrombin Time 14.2 11.4 - 15.2 seconds   INR 1.10   APTT     Status: None   Collection Time: 07/21/16  5:35 PM  Result Value Ref Range   aPTT 31 24 - 36 seconds  Comprehensive metabolic panel     Status: Abnormal   Collection Time: 07/21/16  5:35 PM  Result Value Ref Range   Sodium 136 135 - 145 mmol/L   Potassium 4.2 3.5 - 5.1 mmol/L   Chloride 102 101 - 111 mmol/L   CO2 23 22 - 32 mmol/L   Glucose, Bld 165 (  H) 65 - 99 mg/dL   BUN 10 6 - 20 mg/dL   Creatinine, Ser 0.73 0.44 - 1.00 mg/dL   Calcium 9.2 8.9 - 10.3 mg/dL   Total Protein 7.2 6.5 - 8.1 g/dL   Albumin 4.1 3.5 - 5.0 g/dL   AST 28 15 - 41 U/L   ALT 18 14 - 54 U/L   Alkaline Phosphatase 54 38 - 126 U/L   Total Bilirubin 0.8 0.3 - 1.2 mg/dL   GFR calc non Af Amer >60 >60 mL/min   GFR calc Af Amer >60 >60 mL/min    Comment: (NOTE) The eGFR has been calculated using the CKD EPI equation. This calculation has not been validated in all clinical situations. eGFR's persistently <60 mL/min signify possible Chronic Kidney Disease.    Anion gap 11 5 - 15  Troponin I     Status: None   Collection Time: 07/21/16  5:35 PM  Result Value Ref Range   Troponin I <0.03 <0.03 ng/mL  I-stat troponin, ED     Status: None   Collection Time: 07/21/16  5:53 PM  Result Value Ref Range   Troponin i, poc 0.01 0.00 - 0.08 ng/mL   Comment 3            Comment: Due to the release kinetics of cTnI, a negative result within the first hours of the onset of symptoms does not rule out myocardial infarction with certainty. If myocardial infarction is still suspected, repeat the test at appropriate intervals.   Lipid panel     Status: Abnormal   Collection Time: 07/21/16  6:07 PM  Result Value Ref Range    Cholesterol 156 0 - 200 mg/dL   Triglycerides 235 (H) <150 mg/dL   HDL 49 >40 mg/dL   Total CHOL/HDL Ratio 3.2 RATIO   VLDL 47 (H) 0 - 40 mg/dL   LDL Cholesterol 60 0 - 99 mg/dL    Comment:        Total Cholesterol/HDL:CHD Risk Coronary Heart Disease Risk Table                     Men   Women  1/2 Average Risk   3.4   3.3  Average Risk       5.0   4.4  2 X Average Risk   9.6   7.1  3 X Average Risk  23.4   11.0        Use the calculated Patient Ratio above and the CHD Risk Table to determine the patient's CHD Risk.        ATP III CLASSIFICATION (LDL):  <100     mg/dL   Optimal  100-129  mg/dL   Near or Above                    Optimal  130-159  mg/dL   Borderline  160-189  mg/dL   High  >190     mg/dL   Very High    Dg Chest Portable 1 View  Result Date: 07/21/2016 CLINICAL DATA:  Chest pain EXAM: PORTABLE CHEST 1 VIEW COMPARISON:  02/29/2016 FINDINGS: Heart is borderline enlarged. Postop changes. Clear lungs. No pneumothorax or pleural effusion. Normal vascularity. IMPRESSION: No active cardiopulmonary disease. Electronically Signed   By: Marybelle Killings M.D.   On: 07/21/2016 18:38    VITALS: Blood pressure 151/73, pulse 71, temperature 97.9 F (36.6 C), temperature source Oral, resp. rate 24, SpO2 97 %.  EXAM:  General: Alert, oriented x3, no distress Head: no evidence of trauma, PERRL, EOMI, no exophtalmos or lid lag, no myxedema, no xanthelasma; normal ears, nose and oropharynx Neck: normal jugular venous pulsations and no hepatojugular reflux; brisk carotid pulses without delay and no carotid bruits Chest: clear to auscultation, no signs of consolidation by percussion or palpation, normal fremitus, symmetrical and full respiratory excursions Cardiovascular: normal position and quality of the apical impulse, regular rhythm, normal first heart sound and normal second heart sounds, no rubs or gallops, nomurmur Abdomen: no tenderness or distention, no masses by  palpation, no abnormal pulsatility or arterial bruits, normal bowel sounds, no hepatosplenomegaly Extremities: no clubbing, cyanosis or edema; 2+ radial, ulnar and brachial pulses bilaterally; 2+ right femoral, posterior tibial and dorsalis pedis pulses; 2+ left femoral, posterior tibial and dorsalis pedis pulses; no subclavian or femoral bruits Neurological: grossly nonfocal   Cardiac cath June 2017 Diagnostic Diagram        IMPRESSION: 75 year old woman with diabetes mellitus with an extensive history of coronary artery disease, dependent on LIMA to LAD and stented ramus intermedius artery as her only significant residual arterial conduits, presenting with unstable angina and angina at rest, roughly 8 months following placement of a small caliber stent to the ramus intermedius artery. The suspicion for in-stent restenosis is high.  PLAN: Admit to stepdown. Check serial cardiac enzymes. Echocardiogram. Intravenous heparin and intravenous nitroglycerin, titrate for symptoms and BP. Plan diagnostic coronary angiography and possibly repeat angioplasty tomorrow. This procedure has been fully reviewed with the patient and informed consent has been obtained. Previously expressed desire for DO NOT RESUSCITATE status, but wishes active treatment for her coronary problems.   Sanda Klein, MD, [facility] [phone] office [phone] pager  07/21/2016, 8:07 PM

## 2016-07-21 NOTE — ED Notes (Signed)
Patient assisted to bedside commode.

## 2016-07-21 NOTE — ED Triage Notes (Addendum)
Per EMS - pt c/o substernal CP starting last Thursday. Pt took 3 of own SL nitro around 1500 today prior to EMS arrival. Given 2 nitro SL spray w/ EMS, 324mg  aspirin and pain decreased from 7/10 to 4/10. Pt c/o reflux-like symptoms. Pain decreased with positioning. Pt went to cardiologist today, not concerned about CP. Scheduled echo and increased imdur dose. Hx NSTEMI, MI, complex seizures.

## 2016-07-21 NOTE — Progress Notes (Signed)
ANTICOAGULATION CONSULT NOTE - Initial Consult  Pharmacy Consult for heparin Indication: chest pain/ACS  Allergies  Allergen Reactions  . Talwin [Pentazocine] Anaphylaxis    Throat swelling  . Valium [Diazepam] Other (See Comments)    Makes her feel like she's flying  . Vicodin [Hydrocodone-Acetaminophen] Nausea And Vomiting  . Adhesive [Tape] Other (See Comments)    Bruising, Please use "paper" tape  . Levofloxacin Other (See Comments)  . Simvastatin Swelling and Rash    Mouth swelling    Patient Measurements:   Heparin Dosing Weight:   Vital Signs: Temp: 97.9 F (36.6 C) (11/28 1736) Temp Source: Oral (11/28 1736) BP: 159/72 (11/28 1736) Pulse Rate: 74 (11/28 1736)  Labs: No results for input(s): HGB, HCT, PLT, APTT, LABPROT, INR, HEPARINUNFRC, HEPRLOWMOCWT, CREATININE, CKTOTAL, CKMB, TROPONINI in the last 72 hours.  CrCl cannot be calculated (Patient's most recent lab result is older than the maximum 21 days allowed.).   Medical History: Past Medical History:  Diagnosis Date  . Acid reflux   . Arthritis    "all over"  . Chronic stable angina ([facility])   . Coronary artery disease   . Hypercholesterolemia   . Hypertension   . Myocardial infarction 1991; 07/06/2015  . NSTEMI (non-ST elevated myocardial infarction) ([facility]) 10/22/2015  . Psoriatic arthritis ([facility])   . 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 TIA (transient ischemic attack)    "several over the last 20 years" (10/22/2015)  . Type II diabetes mellitus ([facility])     Medications:  Infusions:  . sodium chloride    . heparin      Assessment: [age] yof presented to the ED with CP. To start IV heparin. Labs are currently pending but CBC has been normal in the past. She is not on anticoagulation PTA.   Goal of Therapy:  Heparin level 0.3-0.7 units/ml Monitor platelets by anticoagulation protocol: Yes   Plan:  - Heparin bolus 4000 units IV x 1 -  Heparin gtt 1100 units/hr - Check an 8 hr heparin level - Daily heparin level and CBC  Lafayette Dunlevy, Drake Leachachel Lynn 07/21/2016,5:48 PM

## 2016-07-21 NOTE — ED Provider Notes (Signed)
[facility]-EMERGENCY DEPT Provider Note   CSN: [id number] Arrival date & time: 07/21/16  1717     History   Chief Complaint Chief Complaint  Patient presents with  . Chest Pain    HPI Leah Small is a 75 y.o. female.  HPI Patient began to have chest pain this afternoon. His pressure in her chest and goes to the back and down her left arm. Feels like previous angina. States she had a couple episodes yesterday became with exertion. She has a history of stable angina previous stents and CABG. Saw her cardiologist today but she was not having pain at rest. Today at around 3:00 she began to have more pain. Can come on at rest. Had nitroglycerin at home and by EMS. Total of 5 nitroglycerin. Pain decreased after the nitroglycerin but still present. Cardiologist as scheduled echo and increased Imdur.no fevers. No blood in the stool. She has had aspirin and is on Plavix daily.    Past Medical History:  Diagnosis Date  . Acid reflux   . Arthritis    "all over"  . Chronic stable angina (HCC)   . Coronary artery disease   . Hypercholesterolemia   . Hypertension   . Myocardial infarction 1991; 07/06/2015  . NSTEMI (non-ST elevated myocardial infarction) (HCC) 10/22/2015  . Psoriatic arthritis (HCC)   . 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 TIA (transient ischemic attack)    "several over the last 20 years" (10/22/2015)  . Type II diabetes mellitus [facility])     Patient Active Problem List   Diagnosis Date Noted  . Hypertensive heart disease 01/29/2016  . Hypertensive urgency 01/29/2016  . Normocytic anemia 01/29/2016  . History of vaginal bleeding 01/29/2016  . GERD (gastroesophageal reflux disease) 01/29/2016  . NSTEMI (non-ST elevated myocardial infarction) (HCC) 10/22/2015  . Non-STEMI (non-ST elevated myocardial infarction) (HCC) 10/22/2015  . 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 Coronary atherosclerosis  of native coronary artery 10/22/2015  . Angina pectoris (HCC)   . Memory deficit   . Confusional state   . Sepsis (HCC) 07/06/2015  . Stroke (HCC) 07/06/2015  . Diabetes mellitus with complication (HCC) 07/06/2015  . Essential hypertension 07/06/2015  . Hyperlipidemia 07/06/2015  . Unstable angina pectoris (HCC) 07/06/2015  . Left-sided weakness   . Stroke-like symptoms     Past Surgical History:  Procedure Laterality Date  . ABDOMINAL HYSTERECTOMY  1973  . APPENDECTOMY  1950s   elementary school  . CARDIAC CATHETERIZATION  X 2-3  . CARDIAC CATHETERIZATION N/A 11/21/2015   Procedure: Left Heart Cath and Coronary Angiography;  Surgeon: Corky Crafts, MD;  Location[facility] INVASIVE CV LAB;  Service: Cardiovascular;  Laterality: N/A;  . CARDIAC CATHETERIZATION  11/21/2015   Procedure: Coronary Stent Intervention;  Surgeon: Corky Crafts, MD;  Location: [facility] INVASIVE CV LAB;  Service: Cardiovascular;;  . CARDIAC CATHETERIZATION N/A 01/29/2016   Procedure: Left Heart Cath and Cors/Grafts Angiography;  Surgeon: Tonny Bollman, MD;  Location: [facility] INVASIVE CV LAB;  Service: Cardiovascular;  Laterality: N/A;  . CAROTID STENT INSERTION Bilateral 2011-2012   right-left  . CATARACT EXTRACTION W/ INTRAOCULAR LENS  IMPLANT, BILATERAL Bilateral 2009-2010  . CHOLECYSTECTOMY OPEN  ~ 2014  . CORONARY ANGIOPLASTY    . CORONARY ANGIOPLASTY WITH STENT PLACEMENT  11/21/2015  . CORONARY ARTERY BYPASS GRAFT  08/1995  . DILATION AND CURETTAGE OF UTERUS  X 1  . THROMBECTOMY FEMORAL ARTERY  Right ~ 2015   right femoral artery occlusion/notes 12/13/2014  . TONSILLECTOMY  ~ 1947   2nd grade    OB History    No data available       Home Medications    Prior to Admission medications   Medication Sig Start Date End Date Taking? Authorizing Provider  aspirin EC 81 MG tablet Take 81 mg by mouth at bedtime.   Yes Historical Provider, MD  atorvastatin (LIPITOR) 80 MG tablet Take 1 tablet (80 mg total) by  mouth at bedtime. Patient taking differently: Take 40 mg by mouth at bedtime.  11/22/15  Yes Ripudeep K Rai, MD  bimatoprost (LUMIGAN) 0.01 % SOLN Place 1 drop into both eyes at bedtime.   Yes Historical Provider, MD  brimonidine (ALPHAGAN) 0.2 % ophthalmic solution Place 1 drop into both eyes 2 (two) times daily.    Yes Historical Provider, MD  CALCIUM-MAGNESIUM-ZINC PO Take 1 tablet by mouth every morning.    Yes Historical Provider, MD  clopidogrel (PLAVIX) 75 MG tablet Take 75 mg by mouth every morning.    Yes Historical Provider, MD  gabapentin (NEURONTIN) 100 MG capsule Take 100-200 mg by mouth at bedtime as needed (for neuropathy).  03/26/16  Yes Historical Provider, MD  glipiZIDE (GLUCOTROL XL) 10 MG 24 hr tablet Take 10 mg by mouth every morning. 04/26/15  Yes Historical Provider, MD  Insulin Glargine (LANTUS SOLOSTAR) 100 UNIT/ML Solostar Pen Inject 24 Units into the skin at bedtime.   Yes Historical Provider, MD  insulin lispro (HUMALOG) 100 UNIT/ML injection Inject 6-8 Units into the skin 3 (three) times daily before meals. Per sliding scale: 6 units plus:   CBG 150-200 add 1 unit, 201-250 add 2 units, 251-300 add 3 units, 301-350 add 4 units 351-400 add 5 units, >400 add 6 units and call MD   Yes Historical Provider, MD  isosorbide mononitrate (IMDUR) 60 MG 24 hr tablet Take 30 mg by mouth 2 (two) times daily.  03/21/16  Yes Historical Provider, MD  levETIRAcetam (KEPPRA) 750 MG tablet Take 750 mg by mouth 2 (two) times daily.   Yes Historical Provider, MD  metFORMIN (GLUCOPHAGE) 1000 MG tablet Take 1 tablet (1,000 mg total) by mouth 2 (two) times daily. 01/30/16  Yes Ok Anishristopher R Berge, NP  metoprolol tartrate (LOPRESSOR) 25 MG tablet Take 12.5 mg by mouth at bedtime.    Yes Historical Provider, MD  nitroGLYCERIN (NITRODUR - DOSED IN MG/24 HR) 0.4 mg/hr patch Place 0.4 mg onto the skin daily as needed (for chest pain).  02/19/16  Yes Historical Provider, MD  nitroGLYCERIN (NITROSTAT) 0.4 MG SL  tablet Place 0.4 mg under the tongue every 5 (five) minutes as needed for chest pain.   Yes Historical Provider, MD  nystatin cream (MYCOSTATIN) Apply 1 application topically 2 (two) times daily as needed (yeast infection).  04/25/15  Yes Historical Provider, MD  prednisoLONE acetate (PRED FORTE) 1 % ophthalmic suspension Place 1 drop into the left eye 2 (two) times daily.   Yes Historical Provider, MD  ranitidine (ZANTAC) 150 MG tablet Take 150 mg by mouth every morning.    Yes Historical Provider, MD  timolol (BETIMOL) 0.5 % ophthalmic solution Place 1 drop into both eyes 2 (two) times daily.   Yes Historical Provider, MD  triamcinolone ointment (KENALOG) 0.1 % Apply 1 application topically 3 (three) times daily as needed (itching from psoriasis).   Yes Historical Provider, MD  ustekinumab (STELARA) 45 MG/0.5ML SOSY injection Inject 45 mg  into the skin every 4 (four) months.   Yes Historical Provider, MD  vitamin B-12 (CYANOCOBALAMIN) 1000 MCG tablet Take 1,000 mcg by mouth every morning.    Yes Historical Provider, MD  vitamin E 400 UNIT capsule Take 400 Units by mouth every morning.    Yes Historical Provider, MD    Family History Family History  Problem Relation Age of Onset  . Heart attack Father   . Diabetes Mother   . Cancer Mother   . Stroke Mother   . Breast cancer Cousin     Social History Social History  Substance Use Topics  . Smoking status: Never Smoker  . Smokeless tobacco: Never Used  . Alcohol use No     Allergies   Talwin [pentazocine]; Valium [diazepam]; Vicodin [hydrocodone-acetaminophen]; Adhesive [tape]; Levofloxacin; and Simvastatin   Review of Systems Review of Systems  Constitutional: Negative for activity change and appetite change.  Eyes: Negative for pain.  Respiratory: Negative for chest tightness and shortness of breath.   Cardiovascular: Positive for chest pain. Negative for leg swelling.  Gastrointestinal: Negative for abdominal pain, diarrhea,  nausea and vomiting.  Genitourinary: Negative for flank pain.  Musculoskeletal: Negative for back pain and neck stiffness.  Skin: Negative for rash.  Neurological: Negative for weakness, numbness and headaches.  Psychiatric/Behavioral: Negative for behavioral problems.     Physical Exam Updated Vital Signs BP (!) 183/75 (BP Location: Left Arm)   Pulse 77   Temp 97.8 F (36.6 C) (Oral)   Resp (!) 24   Ht 5\' 3"  (1.6 m)   Wt 145 lb 9.6 oz (66 kg)   SpO2 98%   BMI 25.79 kg/m   Physical Exam  Constitutional: She appears well-developed.  HENT:  Head: Atraumatic.  Neck: Neck supple.  Cardiovascular: Normal rate.   Pulmonary/Chest: Effort normal.  Abdominal: Soft.  Musculoskeletal:  Mild bilateral lower extremity pitting edema.  Neurological: She is alert.  Skin: Skin is warm. Capillary refill takes less than 2 seconds.     ED Treatments / Results  Labs (all labs ordered are listed, but only abnormal results are displayed) Labs Reviewed  CBC - Abnormal; Notable for the following:       Result Value   Hemoglobin 10.7 (*)    HCT 33.3 (*)    All other components within normal limits  COMPREHENSIVE METABOLIC PANEL - Abnormal; Notable for the following:    Glucose, Bld 165 (*)    All other components within normal limits  LIPID PANEL - Abnormal; Notable for the following:    Triglycerides 235 (*)    VLDL 47 (*)    All other components within normal limits  MRSA PCR SCREENING  DIFFERENTIAL  PROTIME-INR  APTT  TROPONIN I  HEPARIN LEVEL (UNFRACTIONATED)  CBC  TROPONIN I  TROPONIN I  TROPONIN I  HEMOGLOBIN A1C  LIPID PANEL  I-STAT TROPOININ, ED    EKG  EKG Interpretation  Date/Time:  Tuesday July 21 2016 17:33:53 EST Ventricular Rate:  72 PR Interval:    QRS Duration: 112 QT Interval:  405 QTC Calculation: 444 R Axis:   100 Text Interpretation:  Sinus rhythm Borderline intraventricular conduction delay Baseline wander in lead(s) V3 REPOLARIZATION  ABNORMALITY No significant change since last tracing Confirmed by Rubin Payor  MD, Maryellen Dowdle [phone]) on 07/21/2016 5:42:05 PM       EKG Interpretation  Date/Time:  Tuesday July 21 2016 18:06:18 EST Ventricular Rate:  68 PR Interval:    QRS Duration: 118 QT Interval:  418 QTC Calculation: 445 R Axis:   97 Text Interpretation:  Sinus rhythm Nonspecific intraventricular conduction delay Repol abnrm suggests ischemia, anterolateral No significant change since last tracing earlier today Confirmed by Rubin Payor  MD, Aydia Maj [phone]) on 07/21/2016 6:46:40 PM        Radiology Dg Chest Portable 1 View  Result Date: 07/21/2016 CLINICAL DATA:  Chest pain EXAM: PORTABLE CHEST 1 VIEW COMPARISON:  02/29/2016 FINDINGS: Heart is borderline enlarged. Postop changes. Clear lungs. No pneumothorax or pleural effusion. Normal vascularity. IMPRESSION: No active cardiopulmonary disease. Electronically Signed   By: Jolaine Click M.D.   On: 07/21/2016 18:38    Procedures Procedures (including critical care time)  Medications Ordered in ED Medications  0.9 %  sodium chloride infusion (10 mL/hr Intravenous Transfusing/Transfer 07/21/16 2115)  heparin ADULT infusion 100 units/mL (25000 units/236mL sodium chloride 0.45%) (1,100 Units/hr Intravenous Transfusing/Transfer 07/21/16 2115)  clopidogrel (PLAVIX) tablet 75 mg (not administered)  brimonidine (ALPHAGAN) 0.2 % ophthalmic solution 1 drop (not administered)  triamcinolone ointment (KENALOG) 0.1 % 1 application (not administered)  latanoprost (XALATAN) 0.005 % ophthalmic solution 1 drop (not administered)  glipiZIDE (GLUCOTROL XL) 24 hr tablet 10 mg (not administered)  metoprolol tartrate (LOPRESSOR) tablet 12.5 mg (not administered)  levETIRAcetam (KEPPRA) tablet 750 mg (not administered)  atorvastatin (LIPITOR) tablet 80 mg (not administered)  famotidine (PEPCID) tablet 20 mg (not administered)  metFORMIN (GLUCOPHAGE) tablet 1,000 mg (not administered)    gabapentin (NEURONTIN) capsule 100-200 mg (not administered)  aspirin chewable tablet 324 mg (not administered)    Or  aspirin suppository 300 mg (not administered)  aspirin EC tablet 81 mg (not administered)  nitroGLYCERIN (NITROSTAT) SL tablet 0.4 mg (not administered)  acetaminophen (TYLENOL) tablet 650 mg (not administered)  ondansetron (ZOFRAN) injection 4 mg (not administered)  sodium chloride flush (NS) 0.9 % injection 3 mL (not administered)  sodium chloride flush (NS) 0.9 % injection 3 mL (not administered)  0.9 %  sodium chloride infusion (not administered)  aspirin chewable tablet 81 mg (not administered)  0.9% sodium chloride infusion (not administered)    Followed by  0.9% sodium chloride infusion (not administered)  timolol (TIMOPTIC) 0.5 % ophthalmic solution 1 drop (not administered)  insulin glargine (LANTUS) injection 24 Units (not administered)  nitroGLYCERIN 50 mg in dextrose 5 % 250 mL (0.2 mg/mL) infusion (20 mcg/min Intravenous Transfusing/Transfer 07/21/16 2115)  heparin bolus via infusion 4,000 Units (4,000 Units Intravenous Bolus from Bag 07/21/16 1805)     Initial Impression / Assessment and Plan / ED Course  I have reviewed the triage vital signs and the nursing notes.  Pertinent labs & imaging results that were available during my care of the patient were reviewed by me and considered in my medical decision making (see chart for details).  Clinical Course     Patient with chest pain. Feels like previous stable angina but has been coming on at rest. Now unstable angina. EKG so some possible ischemia but his been stable from previous and throughout the 2 EKGs today. Troponin negative. Pain improved with nitroglycerin. Admit to cardiology. Likely will get heart catheterization.  CRITICAL CARE Performed by: Billee Cashing Total critical care time: 30 minutes Critical care time was exclusive of separately billable procedures and treating other  patients. Critical care was necessary to treat or prevent imminent or life-threatening deterioration. Critical care was time spent personally by me on the following activities: development of treatment plan with patient and/or surrogate as well as nursing, discussions with consultants, evaluation of  patient's response to treatment, examination of patient, obtaining history from patient or surrogate, ordering and performing treatments and interventions, ordering and review of laboratory studies, ordering and review of radiographic studies, pulse oximetry and re-evaluation of patient's condition.   Final Clinical Impressions(s) / ED Diagnoses   Final diagnoses:  Unstable angina [facility](HCC)    New Prescriptions Current Discharge Medication List       Benjiman CoreNathan Mrk Buzby, MD 07/21/16 2312

## 2016-07-21 NOTE — ED Notes (Signed)
Attempted report x1. 

## 2016-07-22 ENCOUNTER — Encounter (HOSPITAL_COMMUNITY)
Admission: EM | Disposition: A | Discharge status: Home / Self Care | Payer: Self-pay | Source: Home / Self Care | Attending: Cardiovascular Disease

## 2016-07-22 ENCOUNTER — Encounter (HOSPITAL_COMMUNITY): Payer: Self-pay | Admitting: Cardiovascular Disease

## 2016-07-22 HISTORY — PX: CARDIAC CATHETERIZATION: SHX172

## 2016-07-22 LAB — TROPONIN I
Troponin I: 0.04 ng/mL (ref <0.03)
Troponin I: 0.05 ng/mL (ref <0.03)

## 2016-07-22 LAB — GLUCOSE, CAPILLARY
GLUCOSE-CAPILLARY: 129 mg/dL — AB (ref 65–99)
GLUCOSE-CAPILLARY: 158 mg/dL — AB (ref 65–99)
Glucose-Capillary: 107 mg/dL — ABNORMAL HIGH (ref 65–99)
Glucose-Capillary: 234 mg/dL — ABNORMAL HIGH (ref 65–99)

## 2016-07-22 LAB — CBC
HEMATOCRIT: 35.1 % — AB (ref 36.0–46.0)
Hemoglobin: 11 g/dL — ABNORMAL LOW (ref 12.0–15.0)
MCH: 27.2 pg (ref 26.0–34.0)
MCHC: 31.3 g/dL (ref 30.0–36.0)
MCV: 86.7 fL (ref 78.0–100.0)
Platelets: 184 10*3/uL (ref 150–400)
RBC: 4.05 MIL/uL (ref 3.87–5.11)
RDW: 15.8 % — AB (ref 11.5–15.5)
WBC: 6.7 10*3/uL (ref 4.0–10.5)

## 2016-07-22 LAB — BASIC METABOLIC PANEL
Anion gap: 7 (ref 5–15)
BUN: 10 mg/dL (ref 6–20)
CO2: 25 mmol/L (ref 22–32)
CREATININE: 0.62 mg/dL (ref 0.44–1.00)
Calcium: 8.7 mg/dL — ABNORMAL LOW (ref 8.9–10.3)
Chloride: 107 mmol/L (ref 101–111)
GFR calc Af Amer: >60 mL/min (ref >60)
Glucose, Bld: 116 mg/dL — ABNORMAL HIGH (ref 65–99)
Potassium: 4.9 mmol/L (ref 3.5–5.1)
SODIUM: 139 mmol/L (ref 135–145)

## 2016-07-22 LAB — LIPID PANEL
CHOL/HDL RATIO: 3.2 ratio
Cholesterol: 140 mg/dL (ref 0–200)
HDL: 44 mg/dL (ref >40)
LDL CALC: 65 mg/dL (ref 0–99)
TRIGLYCERIDES: 153 mg/dL — AB (ref <150)
VLDL: 31 mg/dL (ref 0–40)

## 2016-07-22 LAB — HEPARIN LEVEL (UNFRACTIONATED): Heparin Unfractionated: 0.59 IU/mL (ref 0.30–0.70)

## 2016-07-22 LAB — MRSA PCR SCREENING: MRSA by PCR: NEGATIVE

## 2016-07-22 SURGERY — LEFT HEART CATH AND CORONARY ANGIOGRAPHY
Anesthesia: LOCAL

## 2016-07-22 MED ORDER — LIDOCAINE HCL (PF) 1 % IJ SOLN
INTRAMUSCULAR | Status: DC | PRN
  Administered 2016-07-22: 5 mL via INTRADERMAL

## 2016-07-22 MED ORDER — ENOXAPARIN SODIUM 40 MG/0.4ML ~~LOC~~ SOLN
40.0000 mg | SUBCUTANEOUS | Status: DC
  Administered 2016-07-23: 40 mg via SUBCUTANEOUS
  Filled 2016-07-22: qty 0.4

## 2016-07-22 MED ORDER — IOPAMIDOL (ISOVUE-370) INJECTION 76%
INTRAVENOUS | Status: AC
  Filled 2016-07-22: qty 100

## 2016-07-22 MED ORDER — MIDAZOLAM HCL 2 MG/2ML IJ SOLN
INTRAMUSCULAR | Status: AC
Start: 2016-07-22 — End: 2016-07-22
  Filled 2016-07-22: qty 2

## 2016-07-22 MED ORDER — METOPROLOL TARTRATE 25 MG PO TABS
25.0000 mg | ORAL_TABLET | Freq: Every day | ORAL | Status: DC
  Administered 2016-07-22: 25 mg via ORAL
  Filled 2016-07-22: qty 1

## 2016-07-22 MED ORDER — HEPARIN (PORCINE) IN NACL 2-0.9 UNIT/ML-% IJ SOLN
INTRAMUSCULAR | Status: DC | PRN
  Administered 2016-07-22: 1000 mL

## 2016-07-22 MED ORDER — ISOSORBIDE MONONITRATE ER 30 MG PO TB24
30.0000 mg | ORAL_TABLET | Freq: Two times a day (BID) | ORAL | Status: DC
Start: 2016-07-22 — End: 2016-07-23
  Administered 2016-07-22 – 2016-07-23 (×2): 30 mg via ORAL
  Filled 2016-07-22 (×2): qty 1

## 2016-07-22 MED ORDER — FENTANYL CITRATE (PF) 100 MCG/2ML IJ SOLN
INTRAMUSCULAR | Status: AC
  Filled 2016-07-22: qty 2

## 2016-07-22 MED ORDER — INSULIN ASPART 100 UNIT/ML ~~LOC~~ SOLN
0.0000 [IU] | Freq: Three times a day (TID) | SUBCUTANEOUS | Status: DC
  Administered 2016-07-22 – 2016-07-23 (×2): 2 [IU] via SUBCUTANEOUS
  Administered 2016-07-23: 5 [IU] via SUBCUTANEOUS

## 2016-07-22 MED ORDER — FENTANYL CITRATE (PF) 100 MCG/2ML IJ SOLN
INTRAMUSCULAR | Status: DC | PRN
  Administered 2016-07-22: 25 ug via INTRAVENOUS

## 2016-07-22 MED ORDER — PREDNISOLONE ACETATE 1 % OP SUSP
1.0000 [drp] | Freq: Two times a day (BID) | OPHTHALMIC | Status: DC
  Administered 2016-07-22 – 2016-07-23 (×3): 1 [drp] via OPHTHALMIC
  Filled 2016-07-22: qty 1

## 2016-07-22 MED ORDER — HEPARIN (PORCINE) IN NACL 2-0.9 UNIT/ML-% IJ SOLN
INTRAMUSCULAR | Status: AC
  Filled 2016-07-22: qty 1000

## 2016-07-22 MED ORDER — IOPAMIDOL (ISOVUE-370) INJECTION 76%
INTRAVENOUS | Status: DC | PRN
  Administered 2016-07-22: 60 mL via INTRA_ARTERIAL

## 2016-07-22 MED ORDER — HEPARIN SODIUM (PORCINE) 1000 UNIT/ML IJ SOLN
INTRAMUSCULAR | Status: DC | PRN
  Administered 2016-07-22: 3500 [IU] via INTRAVENOUS

## 2016-07-22 MED ORDER — SODIUM CHLORIDE 0.9% FLUSH: 3.0000 mL | INTRAVENOUS | Status: DC | PRN

## 2016-07-22 MED ORDER — MIDAZOLAM HCL 2 MG/2ML IJ SOLN
INTRAMUSCULAR | Status: DC | PRN
  Administered 2016-07-22: 1 mg via INTRAVENOUS

## 2016-07-22 MED ORDER — VERAPAMIL HCL 2.5 MG/ML IV SOLN
INTRAVENOUS | Status: AC
  Filled 2016-07-22: qty 2

## 2016-07-22 MED ORDER — ISOSORBIDE MONONITRATE ER 60 MG PO TB24: 60.0000 mg | ORAL_TABLET | Freq: Every day | ORAL | Status: DC

## 2016-07-22 MED ORDER — HEPARIN SODIUM (PORCINE) 1000 UNIT/ML IJ SOLN
INTRAMUSCULAR | Status: AC
Start: 2016-07-22 — End: 2016-07-22
  Filled 2016-07-22: qty 1

## 2016-07-22 MED ORDER — SODIUM CHLORIDE 0.9 % IV SOLN: 250.0000 mL | INTRAVENOUS | Status: DC | PRN

## 2016-07-22 MED ORDER — LIDOCAINE HCL (PF) 1 % IJ SOLN
INTRAMUSCULAR | Status: AC
  Filled 2016-07-22: qty 30

## 2016-07-22 MED ORDER — SODIUM CHLORIDE 0.9 % IV SOLN
INTRAVENOUS | Status: AC
  Administered 2016-07-22: 12:00:00 via INTRAVENOUS

## 2016-07-22 MED ORDER — SODIUM CHLORIDE 0.9% FLUSH
3.0000 mL | Freq: Two times a day (BID) | INTRAVENOUS | Status: DC
  Administered 2016-07-22: 3 mL via INTRAVENOUS

## 2016-07-22 SURGICAL SUPPLY — 10 items
CATH INFINITI 5 FR JL3.5 (CATHETERS) ×2 IMPLANT
CATH OPTITORQUE TIG 4.0 5F (CATHETERS) ×2 IMPLANT
DEVICE RAD COMP TR BAND LRG (VASCULAR PRODUCTS) ×2 IMPLANT
GLIDESHEATH SLEND SS 6F .021 (SHEATH) ×2 IMPLANT
GUIDEWIRE INQWIRE 1.5J.035X260 (WIRE) ×1 IMPLANT
INQWIRE 1.5J .035X260CM (WIRE) ×2
KIT HEART LEFT (KITS) ×2 IMPLANT
PACK CARDIAC CATHETERIZATION (CUSTOM PROCEDURE TRAY) ×2 IMPLANT
TRANSDUCER W/STOPCOCK (MISCELLANEOUS) ×2 IMPLANT
TUBING CIL FLEX 10 FLL-RA (TUBING) ×2 IMPLANT

## 2016-07-22 NOTE — Progress Notes (Signed)
Patient Name: Leah Small Date of Encounter: 07/22/2016  Primary Cardiologist: Dr. Trey SailorsKowalski  Hospital Problem List     Principal Problem:   Unstable angina pectoris [facility](HCC) Active Problems:   Diabetes mellitus with complication [facility](HCC)   Essential hypertension   Hyperlipidemia   Coronary atherosclerosis of native coronary artery   Hypertensive urgency     Subjective   Feels well, denies chest pain and SOB.   Inpatient Medications    Scheduled Meds: . aspirin  324 mg Oral NOW   Or  . aspirin  300 mg Rectal NOW  . [START ON 07/23/2016] aspirin EC  81 mg Oral Daily  . atorvastatin  80 mg Oral QHS  . brimonidine  1 drop Both Eyes BID  . clopidogrel  75 mg Oral Daily  . famotidine  20 mg Oral Daily  . gabapentin  100-200 mg Oral QHS  . glipiZIDE  10 mg Oral q morning - 10a  . insulin glargine  24 Units Subcutaneous QHS  . latanoprost  1 drop Both Eyes QHS  . levETIRAcetam  750 mg Oral BID  . metFORMIN  1,000 mg Oral BID WC  . metoprolol tartrate  12.5 mg Oral QHS  . morphine      . sodium chloride flush  3 mL Intravenous Q12H  . timolol  1 drop Both Eyes BID   Continuous Infusions: . sodium chloride Stopped (07/22/16 0639)  . sodium chloride 1 mL/kg/hr (07/22/16 0659)  . heparin 1,100 Units/hr (07/21/16 1805)   PRN Meds: sodium chloride, acetaminophen, morphine injection, nitroGLYCERIN, ondansetron (ZOFRAN) IV, sodium chloride flush, triamcinolone ointment   Vital Signs    Vitals:   07/21/16 2208 07/22/16 0003 07/22/16 0300 07/22/16 0500  BP: (!) 183/75 131/73 (!) 121/56 135/61  Pulse: 77 (!) 105 63 66  Resp: (!) 24 (!) 26 16 (!) 21  Temp: 97.8 F (36.6 C)   97.6 F (36.4 C)  TempSrc: Oral   Oral  SpO2: 98% 99% 100% 99%  Weight: 145 lb 9.6 oz (66 kg)     Height: 5\' 3"  (1.6 m)       Intake/Output Summary (Last 24 hours) at 07/22/16 0806 Last data filed at 07/22/16 0654  Gross per 24 hour  Intake           571.38 ml  Output              500 ml    Net            71.38 ml   Filed Weights   07/21/16 2208  Weight: 145 lb 9.6 oz (66 kg)    Physical Exam   GEN: Well nourished, well developed, in no acute distress.  HEENT: Grossly normal.  Neck: Supple, no JVD, carotid bruits, or masses. Cardiac: RRR, no murmurs, rubs, or gallops. No clubbing, cyanosis, edema.  Radials/DP/PT 2+ and equal bilaterally.  Respiratory:  Respirations regular and unlabored, clear to auscultation bilaterally. GI: Soft, nontender, nondistended, BS + x 4. MS: no deformity or atrophy. Skin: warm and dry, no rash. Neuro:  Strength and sensation are intact. Psych: AAOx3.  Normal affect.  Labs    CBC  Recent Labs  07/21/16 1735 07/22/16 0528  WBC 6.8 6.7  NEUTROABS 3.6  --   HGB 10.7* 11.0*  HCT 33.3* 35.1*  MCV 85.6 86.7  PLT 222 184   Basic Metabolic Panel  Recent Labs  07/21/16 1735  NA 136  K 4.2  CL 102  CO2 23  GLUCOSE 165*  BUN 10  CREATININE 0.73  CALCIUM 9.2   Liver Function Tests  Recent Labs  07/21/16 1735  AST 28  ALT 18  ALKPHOS 54  BILITOT 0.8  PROT 7.2  ALBUMIN 4.1   Cardiac Enzymes  Recent Labs  07/21/16 1735 07/21/16 2304 07/22/16 0528  TROPONINI <0.03 <0.03 0.04*   Fasting Lipid Panel  Recent Labs  07/22/16 0528  CHOL 140  HDL 44  LDLCALC 65  TRIG 153*  CHOLHDL 3.2     Telemetry    NSR, some short (3-4 beats) of NSVT - Personally Reviewed  ECG    NSR with inferolateral ST depression consistent with previous EKG's - Personally Reviewed  Radiology    Dg Chest Portable 1 View  Result Date: 07/21/2016 CLINICAL DATA:  Chest pain EXAM: PORTABLE CHEST 1 VIEW COMPARISON:  02/29/2016 FINDINGS: Heart is borderline enlarged. Postop changes. Clear lungs. No pneumothorax or pleural effusion. Normal vascularity. IMPRESSION: No active cardiopulmonary disease. Electronically Signed   By: Jolaine ClickArthur  Hoss M.D.   On: 07/21/2016 18:38    Cardiac Studies  Left Heart Cath and Cors/Grafts  Angiography  Mid Cx lesion, 100% stenosed.  Prox LAD lesion, 100% stenosed.  LIMA .  Patent LIMA graft with extensive collaterals to the LCx and RCA  Prox RCA to Mid RCA lesion, 90% stenosed.  Mid RCA lesion, 100% stenosed.  SVG was not injected .  Prox Graft lesion, 100% stenosed.  SVG was not injected .  Prox Graft lesion, 100% stenosed.  SVG was not injected .  Prox Graft lesion, 100% stenosed.  There is moderate left ventricular systolic dysfunction.   1. Severe native 3 vessel CAD 2. S/P CABG with continued patency of the LIMA-LAD graft and chronic occlusion of all vein grafts 3. Continued patency of the ramus intermedius stent 4. Moderate segmental LV systolic dysfunction c/w old inferior MI  Recommend: continued medical therapy. Coronary anatomy appears stable. The LIMA graft is supplying most of her coronary circulation.  Diagnostic Diagram        Patient Profile     Leah Small is a 75 year old female with a past medical history of CAD with remote bypass surgery and occluded native artery and saphenous vein grafts with residual patency of LIMA to LAD bypass and placement of a drug-eluting stent to the ramus intermedius artery in March 2017 (2.25 x 14 resolute), also has type 2 diabetes mellitus, hypercholesterolemia, psoriasis and psoriatic arthritis, complex partial seizures, gastroesophageal reflux disease. She has moderate ischemic cardiomyopathy with an estimated ejection fraction of 35-45 percent primarily due to inferior wall akinesis.   She did have some recurrent symptoms in June and underwent repeat coronary angiography which showed patency of the stent placed 3 months earlier. Presents with unstable angina plan for heart cath today.   Assessment & Plan    1. CAD: Had recent DES placed to ramus intermedius artery in March of this year. Needs left heart cath today to assess patency of stent. Also with history of extensive CAD, most recent cath report  above. Her LIMA graft supplies most of her coronary circulation.   Continue heparin gtt. She is on Plavix + ASA at home. Continue high intensity statin and metoprolol.   She is on 60mg  isosorbide BID, has taken Ranexa in the past but it was stopped due to cost. BP soft currently, but has been hypertensive this admission, will follow and consider increasing isosorbide.   2. History of CABG  3. DM:  On sliding scale. Metformin on hold.   4. HLD: Continue high intensity statin.    Signed, Little Ishikawa, NP  07/22/2016, 8:06 AM  Patient seen and examined and history reviewed. Agree with above findings and plan. Patient seen after cath procedure. This demonstrated patent LIMA to LAD and patent stent in Ramus. No other new disease. Unclear as to the cause of her recent accelerated angina. She was taking Imdur 30 mg only at night. Will increase to bid. Metoprolol dose increased. Unable to afford Ranexa. Will check Echo today. Watch on above medication adjustments to make sure she doesn't get hypotensive. Anticipate DC in am.  Peter [place], MDFACC 07/22/2016 11:43 AM

## 2016-07-22 NOTE — Progress Notes (Signed)
ANTICOAGULATION CONSULT NOTE - Follow-up Consult  Pharmacy Consult for heparin Indication: chest pain/ACS  Allergies  Allergen Reactions  . Talwin [Pentazocine] Anaphylaxis and Swelling    Throat swelling  . Valium [Diazepam] Other (See Comments)    Makes her feel like she's flying  . Vicodin [Hydrocodone-Acetaminophen] Nausea And Vomiting  . Adhesive [Tape] Other (See Comments)    Bruising and TEARING!!!  Please use "paper" tape  . Levofloxacin Other (See Comments)    Unknown   . Simvastatin Swelling and Rash    Mouth swelling    Patient Measurements: Height: 5\' 3"  (160 cm) Weight: 145 lb 9.6 oz (66 kg) IBW/kg (Calculated) : 52.4 Heparin Dosing Weight:   Vital Signs: Temp: 97.8 F (36.6 C) (11/28 2208) Temp Source: Oral (11/28 2208) BP: 131/73 (11/29 0003) Pulse Rate: 105 (11/29 0003)  Labs:  Recent Labs  07/21/16 1735 07/21/16 2304 07/22/16 0224  HGB 10.7*  --   --   HCT 33.3*  --   --   PLT 222  --   --   APTT 31  --   --   LABPROT 14.2  --   --   INR 1.10  --   --   HEPARINUNFRC  --   --  0.59  CREATININE 0.73  --   --   TROPONINI <0.03 <0.03  --     Estimated Creatinine Clearance: 54.6 mL/min (by C-G formula based on SCr of 0.73 mg/dL).  Assessment: 75 yof on heparin for r/o ACS. Plan for cath today. Heparin level therapeutic on 1100 units/hr. No bleeding noted.  Goal of Therapy:  Heparin level 0.3-0.7 units/ml Monitor platelets by anticoagulation protocol: Yes   Plan:  - Continue heparin gtt 1100 units/hr - F/u post cath  Christoper Fabianaron Chisom Aust, PharmD, BCPS Clinical pharmacist, pager [phone] 07/22/2016,3:06 AM

## 2016-07-22 NOTE — Interval H&P Note (Signed)
Cath Lab Visit (complete for each Cath Lab visit)  Clinical Evaluation Leading to the Procedure:   ACS: Yes.    Non-ACS:    Anginal Classification: CCS IV  Anti-ischemic medical therapy: Maximal Therapy (2 or more classes of medications)  Non-Invasive Test Results: No non-invasive testing performed  Prior CABG: Previous CABG      History and Physical Interval Note:  07/22/2016 10:07 AM  Leah Small  has presented today for surgery, with the diagnosis of unstable angina  The various methods of treatment have been discussed with the patient and family. After consideration of risks, benefits and other options for treatment, the patient has consented to  Procedure(s): Left Heart Cath and Coronary Angiography (N/A) as a surgical intervention .  The patient's history has been reviewed, patient examined, no change in status, stable for surgery.  I have reviewed the patient's chart and labs.  Questions were answered to the patient's satisfaction.     Lorine BearsMuhammad Arida

## 2016-07-22 NOTE — Progress Notes (Addendum)
Pt began to C/O chest & jaw pain 10/10. BP 212/105. EKG done. Nitro titrated 5mcg X 3 with no relief. BP continued to increase: 221/110. On call cardiologist notified. 15 mg hydralazine and 2 mg morphine ordered and administered.  Order not to titrate nitro any further and to use morphine for chest pain instead. During this time, pt LOC decreased and she displayed seizure-like activity. Son stated that this was a "complex partial seizure" and that these are often brought on by pain.  Recheck of BP in 10 minutes was 131/73. Will continue to monitor closely.

## 2016-07-22 NOTE — Progress Notes (Signed)
BP check: 95/54. Nitro turned down from 35 mcg/min to 25 mcg/min. BP recheck in 10 minutes: 69/52. Nitro turned off. On call cardiologist notified. Bolus of 250 ml NS ordered. BP now 113/57. Will continue to monitor closely.

## 2016-07-23 ENCOUNTER — Other Ambulatory Visit (HOSPITAL_COMMUNITY): Payer: Medicare Other

## 2016-07-23 LAB — GLUCOSE, CAPILLARY
GLUCOSE-CAPILLARY: 152 mg/dL — AB (ref 65–99)
GLUCOSE-CAPILLARY: 188 mg/dL — AB (ref 65–99)
GLUCOSE-CAPILLARY: 283 mg/dL — AB (ref 65–99)
GLUCOSE-CAPILLARY: 305 mg/dL — AB (ref 65–99)

## 2016-07-23 LAB — HEMOGLOBIN A1C
HEMOGLOBIN A1C: 6.8 % — AB (ref 4.8–5.6)
MEAN PLASMA GLUCOSE: 148 mg/dL

## 2016-07-23 MED ORDER — METOPROLOL TARTRATE 25 MG PO TABS: 25.0000 mg | ORAL_TABLET | Freq: Every day | ORAL | 12 refills | Status: DC

## 2016-07-23 MED FILL — Verapamil HCl IV Soln 2.5 MG/ML: INTRAVENOUS | Qty: 2 | Status: AC

## 2016-07-23 NOTE — Discharge Summary (Signed)
Discharge Summary    Patient ID: Leah Small,  MRN: [id number], DOB/AGE: 1940-10-20 75 y.o.  Admit date: 07/21/2016 Discharge date: 07/23/2016  Primary Care Provider: SPARKS,JEFFREY D Primary Cardiologist: Dr. Gwen PoundsKowalski  Discharge Diagnoses    Principal Problem:   Unstable angina pectoris [facility](HCC) Active Problems:   Diabetes mellitus with complication [facility](HCC)   Essential hypertension   Hyperlipidemia   Coronary atherosclerosis of native coronary artery   Hypertensive urgency   Allergies Allergies  Allergen Reactions  . Talwin [Pentazocine] Anaphylaxis and Swelling    Throat swelling  . Valium [Diazepam] Other (See Comments)    Makes her feel like she's flying  . Vicodin [Hydrocodone-Acetaminophen] Nausea And Vomiting  . Adhesive [Tape] Other (See Comments)    Bruising and TEARING!!!  Please use "paper" tape  . Levofloxacin Other (See Comments)    Unknown   . Simvastatin Swelling and Rash    Mouth swelling    Diagnostic Studies/Procedures  Left Heart Cath and Coronary Angiography 07/22/16  The left ventricular ejection fraction is 35-45% by visual estimate.  LV end diastolic pressure is mildly elevated.  There is moderate left ventricular systolic dysfunction.  Ost Ramus to Ramus lesion, 0 %stenosed.  Mid Cx lesion, 100 %stenosed.  Prox LAD lesion, 100 %stenosed.  LIMA.  Prox RCA to Mid RCA lesion, 90 %stenosed.  Mid RCA lesion, 100 %stenosed.  Prox Graft lesion, 100 %stenosed.  Prox Graft lesion, 100 %stenosed.  Prox Graft lesion, 100 %stenosed.  Dist LAD lesion, 30 %stenosed.  Ramus lesion, 30 %stenosed.  Prox Cx lesion, 90 %stenosed.   1. Significant underlying three-vessel coronary artery disease with known chronically occluded vein grafts. Patent LIMA to LAD and patent ramus stent. No significant change in coronary anatomy since most recent cardiac catheterization in June. The left circumflex and RCA territories are supplied by  collaterals from the LIMA and ramus. 2. Moderately reduced LV systolic function with severe inferior wall hypokinesis and moderately elevated left ventricular end-diastolic pressure.  Recommendations: Continue aggressive medical therapy. The patient was noted to have short runs of atrial tachycardia while in the cath lab table. I increased the dose of metoprolol to 25 mg twice daily. Continue antianginal therapy and consider resuming Ranexa if the patient can afford.  Diagnostic Diagram         _____________   History of Present Illness     This is a 75 y.o. female with a past medical history significant for extensive coronary artery disease with remote bypass surgery and occluded native artery and saphenous vein grafts with residual patency of LIMA to LAD bypass and placement of a drug-eluting stent to the ramus intermedius artery in March 2017 (2.25 x 14 resolute), also has type 2 diabetes mellitus, hypercholesterolemia, psoriasis and psoriatic arthritis, complex partial seizures, gastroesophageal reflux disease. She has moderate ischemic cardiomyopathy with an estimated ejection fraction of 35-45 percent primarily due to inferior wall akinesis.  She had intractable angina with severe limitation of functional status (able to walk no more than 30 feet) prior to placement of her stent in March. She had remarkable improvement in her activity tolerance following placement of the stent and was able to tolerate walking more than 150 feet and participate in activities with her family lasting several hours. She did have some recurrent symptoms in June 2017 and underwent repeat coronary angiography which showed patency of the stent placed 3 months earlier. Over the last couple of months, she has had recurrent exercise intolerance and increasing  frequency of angina pectoris. She saw her usual cardiologist, Dr. Gwen PoundsKowalski on 07/21/16 and expressed these complaints, with plans made for outpatient cardiac  testing.   After she returned home from her appointment with Dr. Gwen PoundsKowalski she experienced chest discomfort that woke her from sleep and not remit even after 3 sublingual nitroglycerin tablets and the nitroglycerin patch. She therefore came to the emergency room. She is now asymptomatic on a nitroglycerin intravenous infusion. The initial cardiac troponin is normal. Her electrocardiogram shows marked ST segment depression in the anterolateral leads, but this is a chronic abnormality.  When she feels well her blood pressure is under excellent control, typically in the range of 120/60. Recently attempts have been made to escalate her oral antianginals and she has had a few episodes of symptomatic hypotension but without syncope. She has had systolic blood pressure recorded as low as 85 mmHg. In the past she did take a Ranexa, but was unable to continue this medication due to cost. Her son Leah Small is a paramedic. These are in the emergency room today.   Hospital Course     1. History of CAD: Presented with accelerated angina and underwent coronary angiography yesterday. Patent LIMA to LAD and Patent stent in Ramus. No new disease.   Imdur increased to 30mg  BID (she was previously only taking 30mg  at night). Her metoprolol was increased as well to 25mg  po hs.   Continue Plavix and ASA. On high intensity statin.   2. History of CABG  3. DM: On sliding scale. Metformin on hold.   4. HLD: Continue high intensity statin   _____________  Discharge Vitals Blood pressure (!) 104/56, pulse 61, temperature 97.6 F (36.4 C), temperature source Axillary, resp. rate 17, height 5\' 3"  (1.6 m), weight 140 lb 8 oz (63.7 kg), SpO2 97 %.  Filed Weights   07/21/16 2208 07/23/16 0519  Weight: 145 lb 9.6 oz (66 kg) 140 lb 8 oz (63.7 kg)    Labs & Radiologic Studies     CBC  Recent Labs  07/21/16 1735 07/22/16 0528  WBC 6.8 6.7  NEUTROABS 3.6  --   HGB 10.7* 11.0*  HCT 33.3* 35.1*  MCV 85.6  86.7  PLT 222 184   Basic Metabolic Panel  Recent Labs  07/21/16 1735 07/22/16 1106  NA 136 139  K 4.2 4.9  CL 102 107  CO2 23 25  GLUCOSE 165* 116*  BUN 10 10  CREATININE 0.73 0.62  CALCIUM 9.2 8.7*   Liver Function Tests  Recent Labs  07/21/16 1735  AST 28  ALT 18  ALKPHOS 54  BILITOT 0.8  PROT 7.2  ALBUMIN 4.1   Cardiac Enzymes  Recent Labs  07/21/16 2304 07/22/16 0528 07/22/16 1106  TROPONINI <0.03 0.04* 0.05*   Hemoglobin A1C  Recent Labs  07/21/16 2304  HGBA1C 6.8*   Fasting Lipid Panel  Recent Labs  07/22/16 0528  CHOL 140  HDL 44  LDLCALC 65  TRIG 153*  CHOLHDL 3.2    Dg Chest Portable 1 View  Result Date: 07/21/2016 CLINICAL DATA:  Chest pain EXAM: PORTABLE CHEST 1 VIEW COMPARISON:  02/29/2016 FINDINGS: Heart is borderline enlarged. Postop changes. Clear lungs. No pneumothorax or pleural effusion. Normal vascularity. IMPRESSION: No active cardiopulmonary disease. Electronically Signed   By: Jolaine ClickArthur  Hoss M.D.   On: 07/21/2016 18:38    Disposition   Pt is being discharged home today in good condition.  Follow-up Plans & Appointments   She will follow up with  Dr. Gwen Pounds on Dec. 20th with Echo.   Discharge Instructions    Diet - low sodium heart healthy    Complete by:  As directed    Increase activity slowly    Complete by:  As directed       Discharge Medications   Current Discharge Medication List    CONTINUE these medications which have CHANGED   Details  metoprolol tartrate (LOPRESSOR) 25 MG tablet Take 1 tablet (25 mg total) by mouth at bedtime. Qty: 30 tablet, Refills: 12      CONTINUE these medications which have NOT CHANGED   Details  aspirin EC 81 MG tablet Take 81 mg by mouth at bedtime.    atorvastatin (LIPITOR) 80 MG tablet Take 1 tablet (80 mg total) by mouth at bedtime. Qty: 30 tablet, Refills: 3    bimatoprost (LUMIGAN) 0.01 % SOLN Place 1 drop into both eyes at bedtime.    brimonidine (ALPHAGAN)  0.2 % ophthalmic solution Place 1 drop into both eyes 2 (two) times daily.     CALCIUM-MAGNESIUM-ZINC PO Take 1 tablet by mouth every morning.     clopidogrel (PLAVIX) 75 MG tablet Take 75 mg by mouth every morning.     gabapentin (NEURONTIN) 100 MG capsule Take 100-200 mg by mouth at bedtime as needed (for neuropathy).     glipiZIDE (GLUCOTROL XL) 10 MG 24 hr tablet Take 10 mg by mouth every morning.    Insulin Glargine (LANTUS SOLOSTAR) 100 UNIT/ML Solostar Pen Inject 24 Units into the skin at bedtime.    insulin lispro (HUMALOG) 100 UNIT/ML injection Inject 6-8 Units into the skin 3 (three) times daily before meals. Per sliding scale: 6 units plus:   CBG 150-200 add 1 unit, 201-250 add 2 units, 251-300 add 3 units, 301-350 add 4 units 351-400 add 5 units, >400 add 6 units and call MD    isosorbide mononitrate (IMDUR) 60 MG 24 hr tablet Take 30 mg by mouth 2 (two) times daily.     levETIRAcetam (KEPPRA) 750 MG tablet Take 750 mg by mouth 2 (two) times daily.    metFORMIN (GLUCOPHAGE) 1000 MG tablet Take 1 tablet (1,000 mg total) by mouth 2 (two) times daily.    nitroGLYCERIN (NITRODUR - DOSED IN MG/24 HR) 0.4 mg/hr patch Place 0.4 mg onto the skin daily as needed (for chest pain).     nitroGLYCERIN (NITROSTAT) 0.4 MG SL tablet Place 0.4 mg under the tongue every 5 (five) minutes as needed for chest pain.    nystatin cream (MYCOSTATIN) Apply 1 application topically 2 (two) times daily as needed (yeast infection).     prednisoLONE acetate (PRED FORTE) 1 % ophthalmic suspension Place 1 drop into the left eye 2 (two) times daily.    ranitidine (ZANTAC) 150 MG tablet Take 150 mg by mouth every morning.     timolol (BETIMOL) 0.5 % ophthalmic solution Place 1 drop into both eyes 2 (two) times daily.    triamcinolone ointment (KENALOG) 0.1 % Apply 1 application topically 3 (three) times daily as needed (itching from psoriasis).    ustekinumab (STELARA) 45 MG/0.5ML SOSY injection Inject 45  mg into the skin every 4 (four) months.    vitamin B-12 (CYANOCOBALAMIN) 1000 MCG tablet Take 1,000 mcg by mouth every morning.     vitamin E 400 UNIT capsule Take 400 Units by mouth every morning.            Outstanding Labs/Studies     Duration of Discharge Encounter  Greater than 30 minutes including physician time.  Signed, Little Ishikawa NP 07/23/2016, 10:44 AM

## 2016-07-23 NOTE — Progress Notes (Signed)
Patient Name: Leah Small Date of Encounter: 07/23/2016  Primary Cardiologist: Dr. Trey SailorsKowalski  Hospital Problem List     Principal Problem:   Unstable angina pectoris [facility](HCC) Active Problems:   Diabetes mellitus with complication [facility](HCC)   Essential hypertension   Hyperlipidemia   Coronary atherosclerosis of native coronary artery   Hypertensive urgency     Subjective   Feels well today, denies chest pain and SOB.   Inpatient Medications    Scheduled Meds: . aspirin EC  81 mg Oral Daily  . atorvastatin  80 mg Oral QHS  . brimonidine  1 drop Both Eyes BID  . clopidogrel  75 mg Oral Daily  . enoxaparin (LOVENOX) injection  40 mg Subcutaneous Q24H  . famotidine  20 mg Oral Daily  . gabapentin  100-200 mg Oral QHS  . glipiZIDE  10 mg Oral q morning - 10a  . insulin aspart  0-9 Units Subcutaneous TID WC  . insulin glargine  24 Units Subcutaneous QHS  . isosorbide mononitrate  30 mg Oral BID  . latanoprost  1 drop Both Eyes QHS  . levETIRAcetam  750 mg Oral BID  . metoprolol tartrate  25 mg Oral QHS  . prednisoLONE acetate  1 drop Left Eye BID  . sodium chloride flush  3 mL Intravenous Q12H  . timolol  1 drop Both Eyes BID   Continuous Infusions: . sodium chloride Stopped (07/22/16 0639)   PRN Meds: sodium chloride, acetaminophen, morphine injection, nitroGLYCERIN, ondansetron (ZOFRAN) IV, sodium chloride flush, triamcinolone ointment   Vital Signs    Vitals:   07/22/16 2034 07/23/16 0023 07/23/16 0434 07/23/16 0519  BP: (!) 179/79 (!) 97/48 (!) 129/58   Pulse: 83 62  63  Resp: (!) 22 17    Temp: 98.4 F (36.9 C) 97.7 F (36.5 C)  98.7 F (37.1 C)  TempSrc: Oral Oral  Oral  SpO2: 96% 96% 97%   Weight:    140 lb 8 oz (63.7 kg)  Height:        Intake/Output Summary (Last 24 hours) at 07/23/16 0734 Last data filed at 07/23/16 0300  Gross per 24 hour  Intake          1645.17 ml  Output             2100 ml  Net          -454.83 ml   Filed Weights   07/21/16 2208 07/23/16 0519  Weight: 145 lb 9.6 oz (66 kg) 140 lb 8 oz (63.7 kg)    Physical Exam   GEN: Well nourished, well developed, in no acute distress.  HEENT: Grossly normal.  Neck: Supple, no JVD, carotid bruits, or masses. Cardiac: RRR, no murmurs, rubs, or gallops. No clubbing, cyanosis, edema.  Radials/DP/PT 2+ and equal bilaterally.  Respiratory:  Respirations regular and unlabored, clear to auscultation bilaterally. GI: Soft, nontender, nondistended, BS + x 4. MS: no deformity or atrophy. Skin: warm and dry, no rash. Neuro:  Strength and sensation are intact. Psych: AAOx3.  Normal affect.  Labs    CBC  Recent Labs  07/21/16 1735 07/22/16 0528  WBC 6.8 6.7  NEUTROABS 3.6  --   HGB 10.7* 11.0*  HCT 33.3* 35.1*  MCV 85.6 86.7  PLT 222 184   Basic Metabolic Panel  Recent Labs  07/21/16 1735 07/22/16 1106  NA 136 139  K 4.2 4.9  CL 102 107  CO2 23 25  GLUCOSE 165* 116*  BUN 10  10  CREATININE 0.73 0.62  CALCIUM 9.2 8.7*   Liver Function Tests  Recent Labs  07/21/16 1735  AST 28  ALT 18  ALKPHOS 54  BILITOT 0.8  PROT 7.2  ALBUMIN 4.1   Cardiac Enzymes  Recent Labs  07/21/16 2304 07/22/16 0528 07/22/16 1106  TROPONINI <0.03 0.04* 0.05*   Hemoglobin A1C  Recent Labs  07/21/16 2304  HGBA1C 6.8*   Fasting Lipid Panel  Recent Labs  07/22/16 0528  CHOL 140  HDL 44  LDLCALC 65  TRIG 153*  CHOLHDL 3.2   Thyroid Function Tests No results for input(s): TSH, T4TOTAL, T3FREE, THYROIDAB in the last 72 hours.  Invalid input(s): FREET3  Telemetry    NSR- Personally Reviewed  ECG    NSR with inferolateral ST depression  - Personally Reviewed  Radiology    Dg Chest Portable 1 View  Result Date: 07/21/2016 CLINICAL DATA:  Chest pain EXAM: PORTABLE CHEST 1 VIEW COMPARISON:  02/29/2016 FINDINGS: Heart is borderline enlarged. Postop changes. Clear lungs. No pneumothorax or pleural effusion. Normal vascularity. IMPRESSION: No  active cardiopulmonary disease. Electronically Signed   By: Jolaine Click M.D.   On: 07/21/2016 18:38    Cardiac Studies   Left Heart Cath and Coronary Angiography 07/22/16  The left ventricular ejection fraction is 35-45% by visual estimate.  LV end diastolic pressure is mildly elevated.  There is moderate left ventricular systolic dysfunction.  Ost Ramus to Ramus lesion, 0 %stenosed.  Mid Cx lesion, 100 %stenosed.  Prox LAD lesion, 100 %stenosed.  LIMA.  Prox RCA to Mid RCA lesion, 90 %stenosed.  Mid RCA lesion, 100 %stenosed.  Prox Graft lesion, 100 %stenosed.  Prox Graft lesion, 100 %stenosed.  Prox Graft lesion, 100 %stenosed.  Dist LAD lesion, 30 %stenosed.  Ramus lesion, 30 %stenosed.  Prox Cx lesion, 90 %stenosed.   1. Significant underlying three-vessel coronary artery disease with known chronically occluded vein grafts. Patent LIMA to LAD and patent ramus stent. No significant change in coronary anatomy since most recent cardiac catheterization in June. The left circumflex and RCA territories are supplied by collaterals from the LIMA and ramus. 2. Moderately reduced LV systolic function with severe inferior wall hypokinesis and moderately elevated left ventricular end-diastolic pressure.  Recommendations: Continue aggressive medical therapy. The patient was noted to have short runs of atrial tachycardia while in the cath lab table. I increased the dose of metoprolol to 25 mg twice daily. Continue antianginal therapy and consider resuming Ranexa if the patient can afford.   Patient Profile     Leah Small is a 75 year old female with a past medical history of CAD with remote bypass surgery and occluded native artery and saphenous vein grafts with residual patency of LIMA to LAD bypass and placement of a drug-eluting stent to the ramus intermedius artery in March 2017 (2.25 x 14 resolute), also has type 2 diabetes mellitus, hypercholesterolemia, psoriasis and  psoriatic arthritis, complex partial seizures, gastroesophageal reflux disease. She has moderate ischemic cardiomyopathy with an estimated ejection fraction of 35-45 percent primarily due to inferior wall akinesis.   She did have some recurrent symptoms in June and underwent repeat coronary angiography which showed patency of the stent placed 3 months earlier. Presented with unstable angina, heart cath done 07/22/16 anatomy unchanged.   Assessment & Plan    1. History of CAD: Presented with accelerated angina and underwent coronary angiography yesterday. Patent LIMA to LAD and Patent stent in Ramus. No new disease.   Imdur  increased to 30mg  BID (she was previously only taking 30mg  at night). Her metoprolol was increased as well to 25mg  po hs.   Continue Plavix and ASA. On high intensity statin.   2. History of CABG  3. DM: On sliding scale. Metformin on hold.   4. HLD: Continue high intensity statin  Signed, Little IshikawaErin E Smith, NP  07/23/2016, 7:34 AM  Patient seen and examined and history reviewed. Agree with above findings and plan. She had a good night. No further angina. Tolerating increase in Imdur and metoprolol. She is stable for DC today. She has follow up Echo and OV with Dr. Gwen PoundsKowalski on Dec 20.   Cesily Cuoco [place], MDFACC 07/23/2016 9:38 AM

## 2016-07-23 NOTE — Progress Notes (Signed)
Results for Cedric FishmanWRIGHT, Ailea R (MRN [id number]) as of 07/23/2016 11:52  Ref. Range 07/22/2016 20:34 07/23/2016 00:24 07/23/2016 05:33 07/23/2016 07:33 07/23/2016 11:21  Glucose-Capillary Latest Ref Range: 65 - 99 mg/dL 409234 (H) 811305 (H) 914188 (H) 152 (H) 283 (H)   Noted that blood sugars have been greater than 180 mg/dl.  Recommend adding Novolog 3 units TID with meals if eating at least 50% of meals and if postprandials tend to be elevated. Smith MinceKendra Chelesea Weiand RN BSN CDE

## 2016-08-12 ENCOUNTER — Other Ambulatory Visit (INDEPENDENT_AMBULATORY_CARE_PROVIDER_SITE_OTHER): Payer: Self-pay | Admitting: Vascular Surgery

## 2016-08-12 DIAGNOSIS — I6523 Occlusion and stenosis of bilateral carotid arteries: Secondary | ICD-10-CM

## 2016-08-13 ENCOUNTER — Ambulatory Visit (INDEPENDENT_AMBULATORY_CARE_PROVIDER_SITE_OTHER): Payer: Medicare Other | Admitting: Vascular Surgery

## 2016-08-13 ENCOUNTER — Encounter (INDEPENDENT_AMBULATORY_CARE_PROVIDER_SITE_OTHER): Payer: Medicare Other

## 2016-09-02 ENCOUNTER — Encounter (HOSPITAL_COMMUNITY): Payer: Self-pay | Admitting: Pharmacy Technician

## 2016-09-02 ENCOUNTER — Emergency Department (HOSPITAL_COMMUNITY): Payer: Medicare Other

## 2016-09-02 ENCOUNTER — Other Ambulatory Visit: Payer: Self-pay

## 2016-09-02 ENCOUNTER — Emergency Department (HOSPITAL_COMMUNITY)
Admission: EM | Admit: 2016-09-02 | Discharge: 2016-09-02 | Disposition: A | Discharge status: Home / Self Care | Payer: Medicare Other | Attending: Emergency Medicine | Admitting: Emergency Medicine

## 2016-09-02 DIAGNOSIS — Z8673 Personal history of transient ischemic attack (TIA), and cerebral infarction without residual deficits: Secondary | ICD-10-CM | POA: Diagnosis not present

## 2016-09-02 DIAGNOSIS — E119 Type 2 diabetes mellitus without complications: Secondary | ICD-10-CM | POA: Insufficient documentation

## 2016-09-02 DIAGNOSIS — I10 Essential (primary) hypertension: Secondary | ICD-10-CM | POA: Diagnosis not present

## 2016-09-02 DIAGNOSIS — Z794 Long term (current) use of insulin: Secondary | ICD-10-CM | POA: Diagnosis not present

## 2016-09-02 DIAGNOSIS — R0602 Shortness of breath: Secondary | ICD-10-CM | POA: Diagnosis not present

## 2016-09-02 DIAGNOSIS — Z955 Presence of coronary angioplasty implant and graft: Secondary | ICD-10-CM | POA: Insufficient documentation

## 2016-09-02 DIAGNOSIS — Z79899 Other long term (current) drug therapy: Secondary | ICD-10-CM | POA: Diagnosis not present

## 2016-09-02 DIAGNOSIS — Z7982 Long term (current) use of aspirin: Secondary | ICD-10-CM | POA: Diagnosis not present

## 2016-09-02 DIAGNOSIS — I25110 Atherosclerotic heart disease of native coronary artery with unstable angina pectoris: Secondary | ICD-10-CM | POA: Insufficient documentation

## 2016-09-02 DIAGNOSIS — I252 Old myocardial infarction: Secondary | ICD-10-CM | POA: Diagnosis not present

## 2016-09-02 DIAGNOSIS — I200 Unstable angina: Secondary | ICD-10-CM

## 2016-09-02 DIAGNOSIS — R072 Precordial pain: Secondary | ICD-10-CM | POA: Diagnosis present

## 2016-09-02 LAB — CBC WITH DIFFERENTIAL/PLATELET
BASOS ABS: 0 10*3/uL (ref 0.0–0.1)
Basophils Relative: 1 %
EOS PCT: 3 %
Eosinophils Absolute: 0.2 10*3/uL (ref 0.0–0.7)
HCT: 33.2 % — ABNORMAL LOW (ref 36.0–46.0)
Hemoglobin: 10.4 g/dL — ABNORMAL LOW (ref 12.0–15.0)
LYMPHS PCT: 39 %
Lymphs Abs: 2.2 10*3/uL (ref 0.7–4.0)
MCH: 26.7 pg (ref 26.0–34.0)
MCHC: 31.3 g/dL (ref 30.0–36.0)
MCV: 85.3 fL (ref 78.0–100.0)
MONO ABS: 0.7 10*3/uL (ref 0.1–1.0)
MONOS PCT: 12 %
Neutro Abs: 2.6 10*3/uL (ref 1.7–7.7)
Neutrophils Relative %: 45 %
PLATELETS: 203 10*3/uL (ref 150–400)
RBC: 3.89 MIL/uL (ref 3.87–5.11)
RDW: 15.4 % (ref 11.5–15.5)
WBC: 5.7 10*3/uL (ref 4.0–10.5)

## 2016-09-02 LAB — I-STAT TROPONIN, ED: TROPONIN I, POC: 0.01 ng/mL (ref 0.00–0.08)

## 2016-09-02 LAB — COMPREHENSIVE METABOLIC PANEL
ALT: 14 U/L (ref 14–54)
ANION GAP: 8 (ref 5–15)
AST: 19 U/L (ref 15–41)
Albumin: 3.8 g/dL (ref 3.5–5.0)
Alkaline Phosphatase: 48 U/L (ref 38–126)
BUN: 12 mg/dL (ref 6–20)
CHLORIDE: 107 mmol/L (ref 101–111)
CO2: 21 mmol/L — ABNORMAL LOW (ref 22–32)
Calcium: 8.8 mg/dL — ABNORMAL LOW (ref 8.9–10.3)
Creatinine, Ser: 0.6 mg/dL (ref 0.44–1.00)
Glucose, Bld: 203 mg/dL — ABNORMAL HIGH (ref 65–99)
POTASSIUM: 4.3 mmol/L (ref 3.5–5.1)
Sodium: 136 mmol/L (ref 135–145)
TOTAL PROTEIN: 6.7 g/dL (ref 6.5–8.1)
Total Bilirubin: 0.3 mg/dL (ref 0.3–1.2)

## 2016-09-02 LAB — BRAIN NATRIURETIC PEPTIDE: B NATRIURETIC PEPTIDE 5: 305.3 pg/mL — AB (ref 0.0–100.0)

## 2016-09-02 NOTE — ED Provider Notes (Signed)
[facility]-EMERGENCY DEPT Provider Note   CSN: [id number] Arrival date & time: 09/02/16  1834     History   Chief Complaint Chief Complaint  Patient presents with  . Chest Pain    HPI Leah Small Mode is a 76 y.o. female.  Patient is a 76 year old female with a significant history of coronary artery disease with multiple admissions for chest pain most recently in November 2017 for similar symptoms as today. At that time she underwent catheterization which showed no new blockages in her Imdur was increased. She has continued taking her medications and today around 1:30 started having mild chest pressure when she laid down to take a nap. She took one nitroglycerin which relieved the pain which radiated into her back. About 2:30 she was awakened with a second bout of pain this one stronger 8 out of 10 requiring another nitroglycerin. The pain then returned at 3:30 and was 10 out of 10 requiring her to take 2 nitroglycerin improving her pain to a 2 out of 10 with improvement in her blood pressure. She then called 911. When EMS arrived patient was hypertensive but awake and alert. She was given 1 more nitroglycerin and route and upon arrival here she is currently pain-free. She states she gets paid often but usually does not have to take this many nitroglycerin. When she has to take this many nitroglycerin she comes to the hospital.    The history is provided by the patient and a relative.  Chest Pain   This is a recurrent problem. The current episode started 6 to 12 hours ago. The problem occurs hourly. The problem has been resolved. The pain is associated with rest. The pain is present in the substernal region. The pain is at a severity of 10/10. The pain is severe. The quality of the pain is described as dull and pressure-like. The pain radiates to the upper back. Episode Length: started having pain at 1:30 but waxing and waning. Associated symptoms include nausea and shortness of breath. Pertinent  negatives include no abdominal pain, no cough, no dizziness, no fever, no leg pain, no lower extremity edema, no sputum production and no vomiting. She has tried nitroglycerin for the symptoms. The treatment provided significant relief. Risk factors include being elderly.  Her past medical history is significant for CAD, hyperlipidemia, hypertension and seizures.  Pertinent negatives for past medical history include no DVT.  Procedure history is positive for cardiac catheterization.    Past Medical History:  Diagnosis Date  . Acid reflux   . Arthritis    "all over"  . Chronic stable angina (HCC)   . Coronary artery disease   . Hypercholesterolemia   . Hypertension   . Myocardial infarction 1991; 07/06/2015  . NSTEMI (non-ST elevated myocardial infarction) (HCC) 10/22/2015  . Psoriatic arthritis (HCC)   . 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 TIA (transient ischemic attack)    "several over the last 20 years" (10/22/2015)  . Type II diabetes mellitus [facility](HCC)     Patient Active Problem List   Diagnosis Date Noted  . Hypertensive heart disease 01/29/2016  . Hypertensive urgency 01/29/2016  . Normocytic anemia 01/29/2016  . History of vaginal bleeding 01/29/2016  . GERD (gastroesophageal reflux disease) 01/29/2016  . NSTEMI (non-ST elevated myocardial infarction) (HCC) 10/22/2015  . Non-STEMI (non-ST elevated myocardial infarction) (HCC) 10/22/2015  . 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 Coronary atherosclerosis of native  coronary artery 10/22/2015  . Angina pectoris (HCC)   . Memory deficit   . Confusional state   . Sepsis (HCC) 07/06/2015  . Stroke (HCC) 07/06/2015  . Diabetes mellitus with complication (HCC) 07/06/2015  . Essential hypertension 07/06/2015  . Hyperlipidemia 07/06/2015  . Unstable angina pectoris (HCC) 07/06/2015  . Left-sided weakness   . Stroke-like symptoms     Past Surgical  History:  Procedure Laterality Date  . ABDOMINAL HYSTERECTOMY  1973  . APPENDECTOMY  1950s   elementary school  . CARDIAC CATHETERIZATION  X 2-3  . CARDIAC CATHETERIZATION N/A 11/21/2015   Procedure: Left Heart Cath and Coronary Angiography;  Surgeon: Corky Crafts, MD;  Location: [facility] INVASIVE CV LAB;  Service: Cardiovascular;  Laterality: N/A;  . CARDIAC CATHETERIZATION  11/21/2015   Procedure: Coronary Stent Intervention;  Surgeon: Corky Crafts, MD;  Location: [facility] INVASIVE CV LAB;  Service: Cardiovascular;;  . CARDIAC CATHETERIZATION N/A 01/29/2016   Procedure: Left Heart Cath and Cors/Grafts Angiography;  Surgeon: Tonny Bollman, MD;  Location: [facility] INVASIVE CV LAB;  Service: Cardiovascular;  Laterality: N/A;  . CARDIAC CATHETERIZATION N/A 07/22/2016   Procedure: Left Heart Cath and Coronary Angiography;  Surgeon: Iran Ouch, MD;  Location: [facility] INVASIVE CV LAB;  Service: Cardiovascular;  Laterality: N/A;  . CAROTID STENT INSERTION Bilateral 2011-2012   right-left  . CATARACT EXTRACTION W/ INTRAOCULAR LENS  IMPLANT, BILATERAL Bilateral 2009-2010  . CHOLECYSTECTOMY OPEN  ~ 2014  . CORONARY ANGIOPLASTY    . CORONARY ANGIOPLASTY WITH STENT PLACEMENT  11/21/2015  . CORONARY ARTERY BYPASS GRAFT  08/1995  . DILATION AND CURETTAGE OF UTERUS  X 1  . 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 TONSILLECTOMY  ~ 1947   2nd grade    OB History    No data available       Home Medications    Prior to Admission medications   Medication Sig Start Date End Date Taking? Authorizing Provider  aspirin EC 81 MG tablet Take 81 mg by mouth at bedtime.    Historical Provider, MD  atorvastatin (LIPITOR) 80 MG tablet Take 1 tablet (80 mg total) by mouth at bedtime. Patient taking differently: Take 40 mg by mouth at bedtime.  11/22/15   Ripudeep K Rai, MD  bimatoprost (LUMIGAN) 0.01 % SOLN Place 1 drop into both eyes at bedtime.    Historical  Provider, MD  brimonidine (ALPHAGAN) 0.2 % ophthalmic solution Place 1 drop into both eyes 2 (two) times daily.     Historical Provider, MD  CALCIUM-MAGNESIUM-ZINC PO Take 1 tablet by mouth every morning.     Historical Provider, MD  clopidogrel (PLAVIX) 75 MG tablet Take 75 mg by mouth every morning.     Historical Provider, MD  gabapentin (NEURONTIN) 100 MG capsule Take 100-200 mg by mouth at bedtime as needed (for neuropathy).  03/26/16   Historical Provider, MD  glipiZIDE (GLUCOTROL XL) 10 MG 24 hr tablet Take 10 mg by mouth every morning. 04/26/15   Historical Provider, MD  Insulin Glargine (LANTUS SOLOSTAR) 100 UNIT/ML Solostar Pen Inject 24 Units into the skin at bedtime.    Historical Provider, MD  insulin lispro (HUMALOG) 100 UNIT/ML injection Inject 6-8 Units into the skin 3 (three) times daily before meals. Per sliding scale: 6 units plus:   CBG 150-200 add 1 unit, 201-250 add 2 units, 251-300 add 3 units, 301-350 add 4 units 351-400 add 5 units, >400 add 6  units and call MD    Historical Provider, MD  isosorbide mononitrate (IMDUR) 60 MG 24 hr tablet Take 30 mg by mouth 2 (two) times daily.  03/21/16   Historical Provider, MD  levETIRAcetam (KEPPRA) 750 MG tablet Take 750 mg by mouth 2 (two) times daily.    Historical Provider, MD  metFORMIN (GLUCOPHAGE) 1000 MG tablet Take 1 tablet (1,000 mg total) by mouth 2 (two) times daily. 01/30/16   Ok Anis, NP  metoprolol tartrate (LOPRESSOR) 25 MG tablet Take 1 tablet (25 mg total) by mouth at bedtime. 07/23/16   Little Ishikawa, NP  nitroGLYCERIN (NITRODUR - DOSED IN MG/24 HR) 0.4 mg/hr patch Place 0.4 mg onto the skin daily as needed (for chest pain).  02/19/16   Historical Provider, MD  nitroGLYCERIN (NITROSTAT) 0.4 MG SL tablet Place 0.4 mg under the tongue every 5 (five) minutes as needed for chest pain.    Historical Provider, MD  nystatin cream (MYCOSTATIN) Apply 1 application topically 2 (two) times daily as needed (yeast infection).   04/25/15   Historical Provider, MD  prednisoLONE acetate (PRED FORTE) 1 % ophthalmic suspension Place 1 drop into the left eye 2 (two) times daily.    Historical Provider, MD  ranitidine (ZANTAC) 150 MG tablet Take 150 mg by mouth every morning.     Historical Provider, MD  timolol (BETIMOL) 0.5 % ophthalmic solution Place 1 drop into both eyes 2 (two) times daily.    Historical Provider, MD  triamcinolone ointment (KENALOG) 0.1 % Apply 1 application topically 3 (three) times daily as needed (itching from psoriasis).    Historical Provider, MD  ustekinumab (STELARA) 45 MG/0.5ML SOSY injection Inject 45 mg into the skin every 4 (four) months.    Historical Provider, MD  vitamin B-12 (CYANOCOBALAMIN) 1000 MCG tablet Take 1,000 mcg by mouth every morning.     Historical Provider, MD  vitamin E 400 UNIT capsule Take 400 Units by mouth every morning.     Historical Provider, MD    Family History Family History  Problem Relation Age of Onset  . Heart attack Father   . Diabetes Mother   . Cancer Mother   . Stroke Mother   . Breast cancer Cousin     Social History Social History  Substance Use Topics  . Smoking status: Never Smoker  . Smokeless tobacco: Never Used  . Alcohol use No     Allergies   Talwin [pentazocine]; Valium [diazepam]; Vicodin [hydrocodone-acetaminophen]; Adhesive [tape]; Levofloxacin; and Simvastatin   Review of Systems Review of Systems  Constitutional: Negative for fever.  Respiratory: Positive for shortness of breath. Negative for cough and sputum production.   Cardiovascular: Positive for chest pain.  Gastrointestinal: Positive for nausea. Negative for abdominal pain and vomiting.  Neurological: Positive for seizures. Negative for dizziness.       Partial complex seizures which are often set off by times of stress. She states she had 2 seizures today which are similar to the one she's had in the past where she gets weakness on one side and facial droop. They  resolved before EMS arrived.  All other systems reviewed and are negative.    Physical Exam Updated Vital Signs There were no vitals taken for this visit.  Physical Exam  Constitutional: She is oriented to person, place, and time. She appears well-developed and well-nourished. No distress.  HENT:  Head: Normocephalic and atraumatic.  Mouth/Throat: Oropharynx is clear and moist.  Eyes: Conjunctivae and EOM are normal.  Pupils are equal, round, and reactive to light.  Neck: Normal range of motion. Neck supple.  Cardiovascular: Normal rate, regular rhythm and intact distal pulses.   No murmur heard. Pulmonary/Chest: Effort normal and breath sounds normal. No respiratory distress. She has no wheezes. She has no rales.  Well healed sternotomy scar  Abdominal: Soft. She exhibits no distension. There is no tenderness. There is no rebound and no guarding.  Musculoskeletal: Normal range of motion. She exhibits no edema or tenderness.  Neurological: She is alert and oriented to person, place, and time.  Skin: Skin is warm and dry. No rash noted. No erythema.  Psychiatric: She has a normal mood and affect. Her behavior is normal.  Nursing note and vitals reviewed.    ED Treatments / Results  Labs (all labs ordered are listed, but only abnormal results are displayed) Labs Reviewed  CBC WITH DIFFERENTIAL/PLATELET - Abnormal; Notable for the following:       Result Value   Hemoglobin 10.4 (*)    HCT 33.2 (*)    All other components within normal limits  COMPREHENSIVE METABOLIC PANEL - Abnormal; Notable for the following:    CO2 21 (*)    Glucose, Bld 203 (*)    Calcium 8.8 (*)    All other components within normal limits  BRAIN NATRIURETIC PEPTIDE - Abnormal; Notable for the following:    B Natriuretic Peptide 305.3 (*)    All other components within normal limits  I-STAT TROPOININ, ED    EKG ED ECG REPORT   Date: 09/02/2016  Rate: 67   Rhythm: normal sinus rhythm  QRS Axis:  normal  Intervals: normal  ST/T Wave abnormalities: nonspecific ST/T changes, ST depressions anteriorly and ST depressions laterally  Conduction Disutrbances:nonspecific intraventricular conduction delay  Narrative Interpretation:   Old EKG Reviewed: unchanged  I have personally reviewed the EKG tracing and agree with the computerized printout as noted.   Radiology Dg Chest Port 1 View  Result Date: 09/02/2016 CLINICAL DATA:  Chest pain for 6 hours. EXAM: PORTABLE CHEST 1 VIEW COMPARISON:  Single view of the chest 07/21/2016. PA and lateral chest 02/29/2016. FINDINGS: Lungs are clear. Heart size is upper normal. The patient is status post CABG. No pneumothorax or pleural effusion. No acute bony abnormality. IMPRESSION: No acute disease. Electronically Signed   By: Drusilla Kanner M.D.   On: 09/02/2016 19:48    Procedures Procedures (including critical care time)  Medications Ordered in ED Medications - No data to display   Initial Impression / Assessment and Plan / ED Course  I have reviewed the triage vital signs and the nursing notes.  Pertinent labs & imaging results that were available during my care of the patient were reviewed by me and considered in my medical decision making (see chart for details).  Clinical Course    Patient presenting today with ongoing chest pain requiring 5 nitroglycerin before it completely resolved. She has an extensive cardiac history with multiple admissions. Last catheterization showed: 1. Significant underlying three-vessel coronary artery disease with known chronically occluded vein grafts. Patent LIMA to LAD and patent ramus stent. No significant change in coronary anatomy since most recent cardiac catheterization in June. The left circumflex and RCA territories are supplied by collaterals from the LIMA and ramus. 2. Moderately reduced LV systolic function with severe inferior wall hypokinesis and moderately elevated left ventricular  end-diastolic pressure. At that time she was mention medically by optimizing her home therapy. Today was a normal day starting  often the pain started around 1:30. We are now 6 hours into her pain which is now resolved. Blood pressure is improving patient is starting to feel better. She denies any change in medications, recent illness or symptoms concerning for PE or dissection.  EKG shows ST depression laterally and anteriorly which is unchanged from EKG in November. Labs and chest x-ray pending 8:29 PM All labs fairly stable. Troponin is 0.01 and that is 6 hours after pain began. BNP is elevated from March now 305 from 170 but patient denies any shortness of breath, new exertional dyspnea and does not have signs of fluid overload.  Patient is requesting to go home. She was able to get up and walk to the bathroom with no significant return of pain. She states this happens often and she knows there is not a lot that can be done. She prefers to follow-up with her cardiologist from [facility] and will call tomorrow.  Discussed with cardiology fellow and he felt this was appropriate.  Pt and son comfortable with plan.  She has plenty of NTG at home and will return if pain returns and she is unable to control it.  She will call her cardiologist tomorrow.  Final Clinical Impressions(s) / ED Diagnoses   Final diagnoses:  Unstable angina [facility])    New Prescriptions New Prescriptions   No medications on file     Gwyneth Sprout, MD 09/02/16 2046

## 2016-09-02 NOTE — ED Triage Notes (Signed)
Pt reports to the ED via [facility] with reports of CP and pressure that started approx 1300 today. Pt with hx of 4 MI's last year with stent placement. Pt states this feels the same. Pt took a total of 4 Nitro at home and was given 1 Nitro spray, 325 ASA and 4mg  Zofran with EMS. Pt hypertensive with EMS at 209 systolic.

## 2016-11-09 ENCOUNTER — Encounter (INDEPENDENT_AMBULATORY_CARE_PROVIDER_SITE_OTHER): Payer: Self-pay | Admitting: Vascular Surgery

## 2016-11-09 ENCOUNTER — Ambulatory Visit (INDEPENDENT_AMBULATORY_CARE_PROVIDER_SITE_OTHER): Payer: Medicare Other | Admitting: Vascular Surgery

## 2016-11-09 ENCOUNTER — Ambulatory Visit (INDEPENDENT_AMBULATORY_CARE_PROVIDER_SITE_OTHER): Payer: Medicare Other

## 2016-11-09 VITALS — BP 190/84 | HR 69 | Resp 16 | Ht 63.0 in | Wt 142.0 lb

## 2016-11-09 DIAGNOSIS — I6523 Occlusion and stenosis of bilateral carotid arteries: Secondary | ICD-10-CM | POA: Diagnosis not present

## 2016-11-09 DIAGNOSIS — E782 Mixed hyperlipidemia: Secondary | ICD-10-CM

## 2016-11-09 DIAGNOSIS — E118 Type 2 diabetes mellitus with unspecified complications: Secondary | ICD-10-CM | POA: Diagnosis not present

## 2016-11-09 DIAGNOSIS — I25110 Atherosclerotic heart disease of native coronary artery with unstable angina pectoris: Secondary | ICD-10-CM

## 2016-11-09 DIAGNOSIS — I10 Essential (primary) hypertension: Secondary | ICD-10-CM | POA: Diagnosis not present

## 2016-11-12 DIAGNOSIS — I6529 Occlusion and stenosis of unspecified carotid artery: Secondary | ICD-10-CM | POA: Insufficient documentation

## 2016-11-12 NOTE — Progress Notes (Signed)
MRN : [id number]  Leah Small is a 76 y.o. (March 12, 1941) female who presents with chief complaint of  Chief Complaint  Patient presents with  . Carotid    Ultrasound follow up  .  History of Present Illness: The patient is seen for follow up evaluation of carotid stenosis. The carotid stenosis followed by ultrasound.   The patient denies amaurosis fugax. There is no recent history of TIA symptoms or focal motor deficits. There is no prior documented CVA.  The patient is taking enteric-coated aspirin 81 mg daily.  There is no history of migraine headaches. There is no history of seizures.  The patient has a history of coronary artery disease, no recent episodes of angina or shortness of breath. The patient denies PAD or claudication symptoms. There is a history of hyperlipidemia which is being treated with a statin.    Carotid Duplex done today shows 40-59% RICA and <30% LICA.  No change compared to last study in 05/08/2015  Current Meds  Medication Sig  . aspirin EC 81 MG tablet Take 81 mg by mouth at bedtime.  [name] atorvastatin (LIPITOR) 80 MG tablet Take 1 tablet (80 mg total) by mouth at bedtime. (Patient taking differently: Take 40 mg by mouth at bedtime. )  . bimatoprost (LUMIGAN) 0.01 % SOLN Place 1 drop into both eyes at bedtime.  . brimonidine (ALPHAGAN) 0.2 % ophthalmic solution Place 1 drop into both eyes 2 (two) times daily.   [name] CALCIUM-MAGNESIUM-ZINC PO Take 1 tablet by mouth every morning.   . clopidogrel (PLAVIX) 75 MG tablet Take 75 mg by mouth every morning.   . gabapentin (NEURONTIN) 100 MG capsule Take 100-200 mg by mouth at bedtime as needed (for neuropathy).   [name] glipiZIDE (GLUCOTROL XL) 10 MG 24 hr tablet Take 10 mg by mouth every morning.  . Insulin Glargine (LANTUS SOLOSTAR) 100 UNIT/ML Solostar Pen Inject 24 Units into the skin at bedtime.  . insulin lispro (HUMALOG) 100 UNIT/ML injection Inject 6-8 Units into the skin 3 (three) times daily before meals.  Per sliding scale: 6 units plus:   CBG 150-200 add 1 unit, 201-250 add 2 units, 251-300 add 3 units, 301-350 add 4 units 351-400 add 5 units, >400 add 6 units and call MD  . isosorbide mononitrate (IMDUR) 60 MG 24 hr tablet Take 30 mg by mouth 2 (two) times daily.   [name] levETIRAcetam (KEPPRA) 750 MG tablet Take 750 mg by mouth 2 (two) times daily.  . metFORMIN (GLUCOPHAGE) 1000 MG tablet Take 1 tablet (1,000 mg total) by mouth 2 (two) times daily.  . metoprolol tartrate (LOPRESSOR) 25 MG tablet Take 1 tablet (25 mg total) by mouth at bedtime.  . nitroGLYCERIN (NITRODUR - DOSED IN MG/24 HR) 0.4 mg/hr patch Place 0.4 mg onto the skin daily as needed (for chest pain).   . nitroGLYCERIN (NITROSTAT) 0.4 MG SL tablet Place 0.4 mg under the tongue every 5 (five) minutes as needed for chest pain.  [name] nystatin cream (MYCOSTATIN) Apply 1 application topically 2 (two) times daily as needed (yeast infection).   . prednisoLONE acetate (PRED FORTE) 1 % ophthalmic suspension Place 1 drop into the left eye 2 (two) times daily.  . ranitidine (ZANTAC) 150 MG tablet Take 150 mg by mouth every morning.   . timolol (BETIMOL) 0.5 % ophthalmic solution Place 1 drop into both eyes 2 (two) times daily.  [name] triamcinolone ointment (KENALOG) 0.1 % Apply 1 application topically 3 (three) times daily as  needed (itching from psoriasis).  [name]. ustekinumab (STELARA) 45 MG/0.5ML SOSY injection Inject 45 mg into the skin every 4 (four) months.  . vitamin B-12 (CYANOCOBALAMIN) 1000 MCG tablet Take 1,000 mcg by mouth every morning.   . vitamin E 400 UNIT capsule Take 400 Units by mouth every morning.     Past Medical History:  Diagnosis Date  . Acid reflux   . Arthritis    "all over"  . Chronic stable angina (HCC)   . Coronary artery disease   . Hypercholesterolemia   . Hypertension   . Myocardial infarction 1991; 07/06/2015  . NSTEMI (non-ST elevated myocardial infarction) (HCC) 10/22/2015  . Psoriatic arthritis (HCC)   . Seizures  (HCC)    "complex partial; 1st one was 07/06/2015; might have had one today" (10/22/2015)  . TIA (transient ischemic attack)    "several over the last 20 years" (10/22/2015)  . Type II diabetes mellitus (HCC)     Past Surgical History:  Procedure Laterality Date  . ABDOMINAL HYSTERECTOMY  1973  . APPENDECTOMY  1950s   elementary school  . CARDIAC CATHETERIZATION  X 2-3  . CARDIAC CATHETERIZATION N/A 11/21/2015   Procedure: Left Heart Cath and Coronary Angiography;  Surgeon: Corky CraftsJayadeep S Varanasi, MD;  [facility];  Service: Cardiovascular;  Laterality: N/A;  . CARDIAC CATHETERIZATION  11/21/2015   Procedure: Coronary Stent Intervention;  Surgeon: Corky CraftsJayadeep S Varanasi, MD;  [facility];  Service: Cardiovascular;;  . CARDIAC CATHETERIZATION N/A 01/29/2016   Procedure: Left Heart Cath and Cors/Grafts Angiography;  Surgeon: Tonny BollmanMichael Cooper, MD;  [facility];  Service: Cardiovascular;  Laterality: N/A;  . CARDIAC CATHETERIZATION N/A 07/22/2016   Procedure: Left Heart Cath and Coronary Angiography;  Surgeon: Iran OuchMuhammad A Arida, MD;  Location: [facility] INVAS[facility] CV LAB;  Service: Cardiovascular;  Laterality: N/A;  . CAROTID STENT INSERTION Bilateral 2011-2012   right-left  . CATARACT EXTRACTION W/ INTRAOCULAR LENS  IMPLANT, BILATERAL Bilateral 2009-2010  . CHOLECYSTECTOMY OPEN  ~ 2014  . CORONARY ANGIOPLASTY    . CORONARY ANGIOPLASTY WITH STENT PLACEMENT  11/21/2015  . CORONARY ARTERY BYPASS GRAFT  08/1995  . DILATION AND CURETTAGE OF UTERUS  X 1  . 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 TONSILLECTOMY  ~ 1947   2nd grade    Social History Social History  Substance Use Topics  . Smoking status: Never Smoker  . Smokeless tobacco: Never Used  . Alcohol use No    Family History Family History  Problem Relation Age of Onset  . Heart attack Father   . Diabetes Mother   . Cancer Mother   . Stroke  Mother   . Breast cancer Cousin   No family history of bleeding/clotting disorders, porphyria or autoimmune disease   Allergies  Allergen Reactions  . Talwin [Pentazocine] Anaphylaxis and Swelling    Throat swelling  . Valium [Diazepam] Other (See Comments)    Makes her feel like she's flying  . Vicodin [Hydrocodone-Acetaminophen] Nausea And Vomiting  . Adhesive [Tape] Other (See Comments)    Bruising and TEARING!!!  Please use "paper" tape  . Levofloxacin Other (See Comments)    Unknown   . Simvastatin Swelling and Rash    Mouth swelling     REVIEW OF SYSTEMS (Negative unless checked)  Constitutional: [] Weight loss  [] Fever  [] Chills Cardiac: [x] Chest pain   [] Chest pressure   [] Palpitations   [] Shortness of breath when  laying flat   [] Shortness of breath with exertion. Vascular:  [] Pain in legs with walking   [] Pain in legs at rest  [] History of DVT   [] Phlebitis   [x] Swelling in legs   [] Varicose veins   [] Non-healing ulcers Pulmonary:   [] Uses home oxygen   [] Productive cough   [] Hemoptysis   [] Wheeze  [] COPD   [] Asthma Neurologic:  [] Dizziness   [] Seizures   [] History of stroke   [] History of TIA  [] Aphasia   [] Vissual changes   [] Weakness or numbness in arm   [] Weakness or numbness in leg Musculoskeletal:   [] Joint swelling   [] Joint pain   [] Low back pain Hematologic:  [] Easy bruising  [] Easy bleeding   [] Hypercoagulable state   [] Anemic Gastrointestinal:  [] Diarrhea   [] Vomiting  [] Gastroesophageal reflux/heartburn   [] Difficulty swallowing. Genitourinary:  [] Chronic kidney disease   [] Difficult urination  [] Frequent urination   [] Blood in urine Skin:  [] Rashes   [] Ulcers  Psychological:  [] History of anxiety   []  History of major depression.  Physical Examination  Vitals:   11/09/16 1530 11/09/16 1531  BP: (!) 194/85 (!) 190/84  Pulse: 69   Resp: 16   Weight: 64.4 kg (142 lb)   Height: 5\' 3"  (1.6 m)    Body mass index is 25.15 kg/m. Gen: WD/WN, NAD Head:  [place]/AT, No temporalis wasting.  Ear/Nose/Throat: Hearing grossly intact, nares w/o erythema or drainage, poor dentition Eyes: PER, EOMI, sclera nonicteric.  Neck: Supple, no masses.  No bruit or JVD.  Pulmonary:  Good air movement, clear to auscultation bilaterally, no use of accessory muscles.  Cardiac: RRR, normal S1, S2, no Murmurs. Vascular: right carotid bruit Vessel Right Left  Radial Palpable Palpable  Ulnar Palpable Palpable  Brachial Palpable Palpable  Carotid Palpable Palpable  Femoral Palpable Palpable  Popliteal Palpable Palpable  PT Palpable Palpable  DP Palpable Palpable   Gastrointestinal: soft, non-distended. No guarding/no peritoneal signs.  Musculoskeletal: M/S 5/5 throughout.  No deformity or atrophy.  Neurologic: CN 2-12 intact. Pain and light touch intact in extremities.  Symmetrical.  Speech is fluent. Motor exam as listed above. Psychiatric: Judgment intact, Mood & affect appropriate for pt's clinical situation. Dermatologic: No rashes or ulcers noted.  No changes consistent with cellulitis. Lymph : No Cervical lymphadenopathy, no lichenification or skin changes of chronic lymphedema.  CBC Lab Results  Component Value Date   WBC 5.7 09/02/2016   HGB 10.4 (L) 09/02/2016   HCT 33.2 (L) 09/02/2016   MCV 85.3 09/02/2016   PLT 203 09/02/2016    BMET    Component Value Date/Time   NA 136 09/02/2016 1913   NA 133 (L) 12/15/2014 0522   K 4.3 09/02/2016 1913   K 4.2 12/15/2014 0522   CL 107 09/02/2016 1913   CL 101 12/15/2014 0522   CO2 21 (L) 09/02/2016 1913   CO2 24 12/15/2014 0522   GLUCOSE 203 (H) 09/02/2016 1913   GLUCOSE 143 (H) 12/15/2014 0522   BUN 12 09/02/2016 1913   BUN 21 (H) 12/15/2014 0522   CREATININE 0.60 09/02/2016 1913   CREATININE 0.71 12/15/2014 0522   CALCIUM 8.8 (L) 09/02/2016 1913   CALCIUM 8.6 (L) 12/15/2014 0522   GFRNONAA >60 09/02/2016 1913   GFRNONAA >60 12/15/2014 0522   GFRAA >60 09/02/2016 1913   GFRAA >60 12/15/2014  0522   CrCl cannot be calculated (Patient's most recent lab result is older than the maximum 21 days allowed.).  COAG Lab Results  Component Value Date  INR 1.10 07/21/2016   INR 1.05 03/29/2016   INR 1.12 01/29/2016    Radiology No results found.  Assessment/Plan 1. Bilateral carotid artery stenosis Recommend:  Given the patient's asymptomatic subcritical stenosis no further invasive testing or surgery at this time.  Duplex ultrasound shows 40-59% RICA and <30% LICA stenosis.  Continue antiplatelet therapy as prescribed Continue management of CAD, HTN and Hyperlipidemia Healthy heart diet,  encouraged exercise at least 4 times per week Follow up in 12 months with duplex ultrasound and physical exam based on the stable >50% stenosis of the right  carotid artery   - VAS US CAROTID; Future  2. Atherosclerosis of native coronary artery with unstable angina pectoris, unspecified whether native or transplanted heart [facility]) Continue cardiac and antihypertensive medications as already ordered and reviewed, no changes at this time.  Continue statin as ordered and reviewed, no changes at this time  Nitrates PRN for chest pain   3. Essential hypertension Continue antihypertensive medications as already ordered, these medications have been reviewed and there are no changes at this time.   4. Diabetes mellitus with complication (HCC) Continue hypoglycemic medications as already ordered, these medications have been reviewed and there are no changes at this time.  Hgb A1C to be monitored as already arranged by primary service   5. Mixed hyperlipidemia Continue statin as ordered and reviewed, no changes at this time     Levora Dredge, MD  11/12/2016 10:19 AM

## 2017-02-07 ENCOUNTER — Encounter (HOSPITAL_COMMUNITY): Payer: Self-pay | Admitting: Emergency Medicine

## 2017-02-07 ENCOUNTER — Emergency Department (HOSPITAL_COMMUNITY)
Admission: EM | Admit: 2017-02-07 | Discharge: 2017-02-07 | Disposition: A | Discharge status: Home / Self Care | Payer: Medicare Other | Attending: Physician Assistant | Admitting: Physician Assistant

## 2017-02-07 DIAGNOSIS — Z955 Presence of coronary angioplasty implant and graft: Secondary | ICD-10-CM | POA: Diagnosis not present

## 2017-02-07 DIAGNOSIS — G40909 Epilepsy, unspecified, not intractable, without status epilepticus: Secondary | ICD-10-CM | POA: Diagnosis not present

## 2017-02-07 DIAGNOSIS — Z794 Long term (current) use of insulin: Secondary | ICD-10-CM | POA: Insufficient documentation

## 2017-02-07 DIAGNOSIS — I2510 Atherosclerotic heart disease of native coronary artery without angina pectoris: Secondary | ICD-10-CM | POA: Insufficient documentation

## 2017-02-07 DIAGNOSIS — E119 Type 2 diabetes mellitus without complications: Secondary | ICD-10-CM | POA: Insufficient documentation

## 2017-02-07 DIAGNOSIS — I252 Old myocardial infarction: Secondary | ICD-10-CM | POA: Diagnosis not present

## 2017-02-07 DIAGNOSIS — Z7982 Long term (current) use of aspirin: Secondary | ICD-10-CM | POA: Insufficient documentation

## 2017-02-07 DIAGNOSIS — I10 Essential (primary) hypertension: Secondary | ICD-10-CM | POA: Insufficient documentation

## 2017-02-07 DIAGNOSIS — R569 Unspecified convulsions: Secondary | ICD-10-CM

## 2017-02-07 DIAGNOSIS — Z8673 Personal history of transient ischemic attack (TIA), and cerebral infarction without residual deficits: Secondary | ICD-10-CM | POA: Diagnosis not present

## 2017-02-07 LAB — CBG MONITORING, ED
GLUCOSE-CAPILLARY: 124 mg/dL — AB (ref 65–99)
GLUCOSE-CAPILLARY: 48 mg/dL — AB (ref 65–99)
Glucose-Capillary: 72 mg/dL (ref 65–99)

## 2017-02-07 LAB — COMPREHENSIVE METABOLIC PANEL
ALBUMIN: 4.3 g/dL (ref 3.5–5.0)
ALT: 16 U/L (ref 14–54)
AST: 24 U/L (ref 15–41)
Alkaline Phosphatase: 51 U/L (ref 38–126)
Anion gap: 12 (ref 5–15)
BUN: 16 mg/dL (ref 6–20)
CHLORIDE: 101 mmol/L (ref 101–111)
CO2: 22 mmol/L (ref 22–32)
Calcium: 9.6 mg/dL (ref 8.9–10.3)
Creatinine, Ser: 0.79 mg/dL (ref 0.44–1.00)
GFR calc Af Amer: >60 mL/min (ref >60)
GLUCOSE: 113 mg/dL — AB (ref 65–99)
Potassium: 4.9 mmol/L (ref 3.5–5.1)
SODIUM: 135 mmol/L (ref 135–145)
Total Bilirubin: 0.7 mg/dL (ref 0.3–1.2)
Total Protein: 7.9 g/dL (ref 6.5–8.1)

## 2017-02-07 LAB — CBC WITH DIFFERENTIAL/PLATELET
Basophils Absolute: 0 10*3/uL (ref 0.0–0.1)
Basophils Relative: 0 %
EOS PCT: 3 %
Eosinophils Absolute: 0.2 10*3/uL (ref 0.0–0.7)
HCT: 35.5 % — ABNORMAL LOW (ref 36.0–46.0)
Hemoglobin: 11 g/dL — ABNORMAL LOW (ref 12.0–15.0)
LYMPHS ABS: 2.7 10*3/uL (ref 0.7–4.0)
LYMPHS PCT: 41 %
MCH: 26.8 pg (ref 26.0–34.0)
MCHC: 31 g/dL (ref 30.0–36.0)
MCV: 86.6 fL (ref 78.0–100.0)
Monocytes Absolute: 0.8 10*3/uL (ref 0.1–1.0)
Monocytes Relative: 12 %
Neutro Abs: 3 10*3/uL (ref 1.7–7.7)
Neutrophils Relative %: 44 %
PLATELETS: 212 10*3/uL (ref 150–400)
RBC: 4.1 MIL/uL (ref 3.87–5.11)
RDW: 15.5 % (ref 11.5–15.5)
WBC: 6.7 10*3/uL (ref 4.0–10.5)

## 2017-02-07 LAB — URINALYSIS, ROUTINE W REFLEX MICROSCOPIC
BACTERIA UA: NONE SEEN
BILIRUBIN URINE: NEGATIVE
Glucose, UA: 50 mg/dL — AB
Hgb urine dipstick: NEGATIVE
KETONES UR: NEGATIVE mg/dL
LEUKOCYTES UA: NEGATIVE
NITRITE: NEGATIVE
Protein, ur: 100 mg/dL — AB
SPECIFIC GRAVITY, URINE: 1.005 (ref 1.005–1.030)
Squamous Epithelial / LPF: NONE SEEN
pH: 7 (ref 5.0–8.0)

## 2017-02-07 LAB — I-STAT TROPONIN, ED: Troponin i, poc: 0.03 ng/mL (ref 0.00–0.08)

## 2017-02-07 LAB — TROPONIN I

## 2017-02-07 NOTE — ED Notes (Signed)
Pt sugar rechecked and is 71 pt stable for discharge

## 2017-02-07 NOTE — ED Triage Notes (Signed)
Per EMS pt coming from church pt has partial complex seizures, pt had several seizures at church, pt also has chest pain, pt had 1 nitro, 324 mg aspirin, pt has h/s of a-fib.

## 2017-02-07 NOTE — Discharge Instructions (Signed)
Please return with any concerns, increasing chest pain, or other concerns.

## 2017-02-07 NOTE — ED Provider Notes (Signed)
[facility] Provider Note   CSN: [id number] Arrival date & time: 02/07/17  1303     History   Chief Complaint Chief Complaint  Patient presents with  . Seizures    HPI Leah Small is a 76 y.o. female.  HPI   Patient's very pleasant 32 her old female presenting seizures. Patient has chronic seizures. Despite using with Keppra and Lacmictal. Patient has multiple seizures breakthrough. Patient followed by a neurologist as an outpatient. Patient also has extensive cardiac disease. Patient often  chest pain associated with the seizures. She reports that she's had this several times in the past. Patient and son at bedside are very familiar with troponins and chest pain workups. She has significant cardiac disease and has chronically occluded vein grafts diffusely.   The chest pain lasted likely less than 20 minutes. Did not go into the arms or neck. No associated diaphoresis or sweating or shortness of breath.  Past Medical History:  Diagnosis Date  . Acid reflux   . Arthritis    "all over"  . Chronic stable angina (HCC)   . Coronary artery disease   . Hypercholesterolemia   . Hypertension   . Myocardial infarction (HCC) 1991; 07/06/2015  . NSTEMI (non-ST elevated myocardial infarction) (HCC) 10/22/2015  . Psoriatic arthritis (HCC)   . 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 TIA (transient ischemic attack)    "several over the last 20 years" (10/22/2015)  . Type II diabetes mellitus [facility])     Patient Active Problem List   Diagnosis Date Noted  . Carotid stenosis 11/12/2016  . Hypertensive heart disease 01/29/2016  . Hypertensive urgency 01/29/2016  . Normocytic anemia 01/29/2016  . History of vaginal bleeding 01/29/2016  . GERD (gastroesophageal reflux disease) 01/29/2016  . NSTEMI (non-ST elevated myocardial infarction) (HCC) 10/22/2015  . Non-STEMI (non-ST elevated myocardial infarction) (HCC)  10/22/2015  . 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 Coronary atherosclerosis of native coronary artery 10/22/2015  . Angina pectoris (HCC)   . Memory deficit   . Confusional state   . Sepsis (HCC) 07/06/2015  . Stroke (HCC) 07/06/2015  . Diabetes mellitus with complication (HCC) 07/06/2015  . Essential hypertension 07/06/2015  . Hyperlipidemia 07/06/2015  . Unstable angina pectoris (HCC) 07/06/2015  . Left-sided weakness   . Stroke-like symptoms     Past Surgical History:  Procedure Laterality Date  . ABDOMINAL HYSTERECTOMY  1973  . APPENDECTOMY  1950s   elementary school  . CARDIAC CATHETERIZATION  X 2-3  . CARDIAC CATHETERIZATION N/A 11/21/2015   Procedure: Left Heart Cath and Coronary Angiography;  Surgeon: Corky Crafts, MD;  Location: [facility] INVASIVE CV LAB;  Service: Cardiovascular;  Laterality: N/A;  . CARDIAC CATHETERIZATION  11/21/2015   Procedure: Coronary Stent Intervention;  Surgeon: Corky Crafts, MD;  Location: [facility] INVASIVE CV LAB;  Service: Cardiovascular;;  . CARDIAC CATHETERIZATION N/A 01/29/2016   Procedure: Left Heart Cath and Cors/Grafts Angiography;  Surgeon: Tonny Bollman, MD;  Location: [facility] INVASIVE CV LAB;  Service: Cardiovascular;  Laterality: N/A;  . CARDIAC CATHETERIZATION N/A 07/22/2016   Procedure: Left Heart Cath and Coronary Angiography;  Surgeon: Iran Ouch, MD;  Location: [facility] INVASIVE CV LAB;  Service: Cardiovascular;  Laterality: N/A;  . CAROTID STENT INSERTION Bilateral 2011-2012   right-left  . CATARACT EXTRACTION W/ INTRAOCULAR LENS  IMPLANT, BILATERAL Bilateral 2009-2010  . CHOLECYSTECTOMY OPEN  ~ 2014  . CORONARY ANGIOPLASTY    .  CORONARY ANGIOPLASTY WITH STENT PLACEMENT  11/21/2015  . CORONARY ARTERY BYPASS GRAFT  08/1995  . DILATION AND CURETTAGE OF UTERUS  X 1  . 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 TONSILLECTOMY  ~ 1947   2nd grade    OB History    No  data available       Home Medications    Prior to Admission medications   Medication Sig Start Date End Date Taking? Authorizing Provider  aspirin EC 81 MG tablet Take 81 mg by mouth at bedtime.    [provider]  atorvastatin (LIPITOR) 80 MG tablet Take 1 tablet (80 mg total) by mouth at bedtime. Patient taking differently: Take 40 mg by mouth at bedtime.  11/22/15   Rai, Ripudeep K, MD  bimatoprost (LUMIGAN) 0.01 % SOLN Place 1 drop into both eyes at bedtime.    [provider]  brimonidine (ALPHAGAN) 0.2 % ophthalmic solution Place 1 drop into both eyes 2 (two) times daily.     [provider]  CALCIUM-MAGNESIUM-ZINC PO Take 1 tablet by mouth every morning.     [provider]  clopidogrel (PLAVIX) 75 MG tablet Take 75 mg by mouth every morning.     [provider]  gabapentin (NEURONTIN) 100 MG capsule Take 100-200 mg by mouth at bedtime as needed (for neuropathy).  03/26/16   [provider]  glipiZIDE (GLUCOTROL XL) 10 MG 24 hr tablet Take 10 mg by mouth every morning. 04/26/15   [provider]  Insulin Glargine (LANTUS SOLOSTAR) 100 UNIT/ML Solostar Pen Inject 24 Units into the skin at bedtime.    [provider]  insulin lispro (HUMALOG) 100 UNIT/ML injection Inject 6-8 Units into the skin 3 (three) times daily before meals. Per sliding scale: 6 units plus:   CBG 150-200 add 1 unit, 201-250 add 2 units, 251-300 add 3 units, 301-350 add 4 units 351-400 add 5 units, >400 add 6 units and call MD    [provider]  isosorbide mononitrate (IMDUR) 60 MG 24 hr tablet Take 30 mg by mouth 2 (two) times daily.  03/21/16   [provider]  levETIRAcetam (KEPPRA) 750 MG tablet Take 750 mg by mouth 2 (two) times daily.    [provider]  metFORMIN (GLUCOPHAGE) 1000 MG tablet Take 1 tablet (1,000 mg total) by mouth 2 (two) times daily. 01/30/16   Ok Anis, NP  metoprolol tartrate (LOPRESSOR) 25  MG tablet Take 1 tablet (25 mg total) by mouth at bedtime. 07/23/16   Little Ishikawa, NP  nitroGLYCERIN (NITRODUR - DOSED IN MG/24 HR) 0.4 mg/hr patch Place 0.4 mg onto the skin daily as needed (for chest pain).  02/19/16   [provider]  nitroGLYCERIN (NITROSTAT) 0.4 MG SL tablet Place 0.4 mg under the tongue every 5 (five) minutes as needed for chest pain.    [provider]  nystatin cream (MYCOSTATIN) Apply 1 application topically 2 (two) times daily as needed (yeast infection).  04/25/15   [provider]  prednisoLONE acetate (PRED FORTE) 1 % ophthalmic suspension Place 1 drop into the left eye 2 (two) times daily.    [provider]  ranitidine (ZANTAC) 150 MG tablet Take 150 mg by mouth every morning.     [provider]  timolol (BETIMOL) 0.5 % ophthalmic solution Place 1 drop into both eyes 2 (two) times daily.    [provider]  triamcinolone  ointment (KENALOG) 0.1 % Apply 1 application topically 3 (three) times daily as needed (itching from psoriasis).    [provider]  ustekinumab Marcy Panning) 45 MG/0.5ML SOSY injection Inject 45 mg into the skin every 4 (four) months.    [provider]  vitamin B-12 (CYANOCOBALAMIN) 1000 MCG tablet Take 1,000 mcg by mouth every morning.     [provider]  vitamin E 400 UNIT capsule Take 400 Units by mouth every morning.     [provider]    Family History Family History  Problem Relation Age of Onset  . Heart attack Father   . Diabetes Mother   . Cancer Mother   . Stroke Mother   . Breast cancer Cousin     Social History Social History  Substance Use Topics  . Smoking status: Never Smoker  . Smokeless tobacco: Never Used  . Alcohol use No     Allergies   Talwin [pentazocine]; Valium [diazepam]; Vicodin [hydrocodone-acetaminophen]; Adhesive [tape]; Levofloxacin; and Simvastatin   Review of Systems Review of Systems  Constitutional: Negative  for activity change.  Respiratory: Negative for chest tightness, shortness of breath and wheezing.   Cardiovascular: Positive for chest pain.  Gastrointestinal: Negative for abdominal pain.  Neurological: Positive for seizures.     Physical Exam Updated Vital Signs BP (!) 193/96   Pulse 68   Temp 97.8 F (36.6 C) (Oral)   Resp 20   Ht 5\' 3"  (1.6 m)   Wt 65.8 kg (145 lb)   SpO2 100%   BMI 25.69 kg/m   Physical Exam  Constitutional: She is oriented to person, place, and time. She appears well-developed and well-nourished.  HENT:  Head: Normocephalic and atraumatic.  Eyes: Right eye exhibits no discharge. Left eye exhibits no discharge.  Cardiovascular: Normal rate, regular rhythm and normal heart sounds.   No murmur heard. Pulmonary/Chest: Effort normal and breath sounds normal. She has no wheezes. She has no rales.  Scar midline.  Abdominal: Soft. She exhibits no distension. There is no tenderness.  Neurological: She is oriented to person, place, and time. No cranial nerve deficit. Coordination normal.  Skin: Skin is warm and dry. She is not diaphoretic.  Psychiatric: She has a normal mood and affect.  Nursing note and vitals reviewed.    ED Treatments / Results  Labs (all labs ordered are listed, but only abnormal results are displayed) Labs Reviewed  CBG MONITORING, ED - Abnormal; Notable for the following:       Result Value   Glucose-Capillary 124 (*)    All other components within normal limits  COMPREHENSIVE METABOLIC PANEL  CBC WITH DIFFERENTIAL/PLATELET  URINALYSIS, ROUTINE W REFLEX MICROSCOPIC  I-STAT TROPOININ, ED    EKG  EKG Interpretation None       Radiology No results found.  Procedures Procedures (including critical care time)  Medications Ordered in ED Medications - No data to display   Initial Impression / Assessment and Plan / ED Course  I have reviewed the triage vital signs and the nursing notes.  Pertinent labs & imaging  results that were available during my care of the patient were reviewed by me and considered in my medical decision making (see chart for details).     Patient's very pleasant 86 her old female presenting seizures. Patient has chronic seizures. Despite using with Keppra and Lacmictal. Patient has multiple seizures breakthrough. Patient followed by a neurologist as an outpatient. Patient also has extensive cardiac disease. Patient often  chest  pain associated with the seizures. She reports that she's had this several times in the past. Patient and son at bedside are very familiar with troponins and chest pain workups. She has significant cardiac disease and has chronically occluded vein grafts diffusely.   The chest pain lasted likely less than 20 minutes. Did not go into the arms or neck. No associated diaphoresis or sweating or shortness of breath.  As for patient seizures, I see no need to work this up. Patient has chronic seizures that are breakthrough seizures. We'll make sure the patient does not have urinary tract infection. Patient's had no cough or other symptoms to stress that she has a respiratory infection.  As for the chest pain. Patient and son are very familiar with serial troponins. They're requesting only to troponins. They understand the risk of discharge after negative troponins given her extensive cardiac disease. They are willing to take such risk and feel that 2 negative troponins sufficiently rule out cardiac ischemia.  Two negative trops.   Told to follow up with cards in the am.   Final Clinical Impressions(s) / ED Diagnoses   Final diagnoses:  None    New Prescriptions New Prescriptions   No medications on file     Abelino DerrickMackuen, Portia Wisdom Lyn, MD 02/07/17 1655

## 2017-02-07 NOTE — ED Notes (Signed)
Pt wanted sugar checked sugar was 48 given OJ and peanut butter crackers

## 2017-04-30 ENCOUNTER — Other Ambulatory Visit: Payer: Self-pay | Admitting: Internal Medicine

## 2017-04-30 DIAGNOSIS — Z1231 Encounter for screening mammogram for malignant neoplasm of breast: Secondary | ICD-10-CM

## 2017-06-26 ENCOUNTER — Emergency Department: Payer: Medicare Other

## 2017-06-26 ENCOUNTER — Encounter: Payer: Self-pay | Admitting: Emergency Medicine

## 2017-06-26 ENCOUNTER — Inpatient Hospital Stay
Admission: EM | Admit: 2017-06-26 | Discharge: 2017-06-29 | DRG: 303 | Disposition: A | Discharge status: Home / Self Care | Payer: Medicare Other | Attending: Internal Medicine | Admitting: Internal Medicine

## 2017-06-26 ENCOUNTER — Inpatient Hospital Stay
Admit: 2017-06-26 | Discharge: 2017-06-26 | Disposition: A | Discharge status: Home / Self Care | Payer: Medicare Other | Attending: Internal Medicine | Admitting: Internal Medicine

## 2017-06-26 ENCOUNTER — Encounter
Admission: EM | Disposition: A | Discharge status: Home / Self Care | Payer: Self-pay | Source: Home / Self Care | Attending: Internal Medicine

## 2017-06-26 DIAGNOSIS — I471 Supraventricular tachycardia: Secondary | ICD-10-CM | POA: Diagnosis present

## 2017-06-26 DIAGNOSIS — I200 Unstable angina: Secondary | ICD-10-CM | POA: Diagnosis present

## 2017-06-26 DIAGNOSIS — Z951 Presence of aortocoronary bypass graft: Secondary | ICD-10-CM

## 2017-06-26 DIAGNOSIS — R079 Chest pain, unspecified: Secondary | ICD-10-CM

## 2017-06-26 DIAGNOSIS — Z66 Do not resuscitate: Secondary | ICD-10-CM | POA: Diagnosis present

## 2017-06-26 DIAGNOSIS — I959 Hypotension, unspecified: Secondary | ICD-10-CM | POA: Diagnosis not present

## 2017-06-26 DIAGNOSIS — Z888 Allergy status to other drugs, medicaments and biological substances status: Secondary | ICD-10-CM

## 2017-06-26 DIAGNOSIS — E1151 Type 2 diabetes mellitus with diabetic peripheral angiopathy without gangrene: Secondary | ICD-10-CM | POA: Diagnosis present

## 2017-06-26 DIAGNOSIS — G40209 Localization-related (focal) (partial) symptomatic epilepsy and epileptic syndromes with complex partial seizures, not intractable, without status epilepticus: Secondary | ICD-10-CM | POA: Diagnosis present

## 2017-06-26 DIAGNOSIS — I10 Essential (primary) hypertension: Secondary | ICD-10-CM | POA: Diagnosis present

## 2017-06-26 DIAGNOSIS — M25512 Pain in left shoulder: Secondary | ICD-10-CM | POA: Diagnosis present

## 2017-06-26 DIAGNOSIS — I160 Hypertensive urgency: Secondary | ICD-10-CM | POA: Diagnosis present

## 2017-06-26 DIAGNOSIS — I252 Old myocardial infarction: Secondary | ICD-10-CM

## 2017-06-26 DIAGNOSIS — Z955 Presence of coronary angioplasty implant and graft: Secondary | ICD-10-CM

## 2017-06-26 DIAGNOSIS — M1990 Unspecified osteoarthritis, unspecified site: Secondary | ICD-10-CM | POA: Diagnosis present

## 2017-06-26 DIAGNOSIS — I25700 Atherosclerosis of coronary artery bypass graft(s), unspecified, with unstable angina pectoris: Secondary | ICD-10-CM | POA: Diagnosis not present

## 2017-06-26 DIAGNOSIS — E871 Hypo-osmolality and hyponatremia: Secondary | ICD-10-CM | POA: Diagnosis present

## 2017-06-26 DIAGNOSIS — J209 Acute bronchitis, unspecified: Secondary | ICD-10-CM | POA: Diagnosis present

## 2017-06-26 DIAGNOSIS — K219 Gastro-esophageal reflux disease without esophagitis: Secondary | ICD-10-CM | POA: Diagnosis present

## 2017-06-26 DIAGNOSIS — I2510 Atherosclerotic heart disease of native coronary artery without angina pectoris: Secondary | ICD-10-CM | POA: Diagnosis present

## 2017-06-26 DIAGNOSIS — Z8673 Personal history of transient ischemic attack (TIA), and cerebral infarction without residual deficits: Secondary | ICD-10-CM | POA: Diagnosis not present

## 2017-06-26 DIAGNOSIS — I213 ST elevation (STEMI) myocardial infarction of unspecified site: Secondary | ICD-10-CM

## 2017-06-26 DIAGNOSIS — R6884 Jaw pain: Secondary | ICD-10-CM | POA: Diagnosis present

## 2017-06-26 DIAGNOSIS — Z86718 Personal history of other venous thrombosis and embolism: Secondary | ICD-10-CM

## 2017-06-26 DIAGNOSIS — L4050 Arthropathic psoriasis, unspecified: Secondary | ICD-10-CM | POA: Diagnosis present

## 2017-06-26 DIAGNOSIS — Z79899 Other long term (current) drug therapy: Secondary | ICD-10-CM

## 2017-06-26 DIAGNOSIS — I081 Rheumatic disorders of both mitral and tricuspid valves: Secondary | ICD-10-CM | POA: Diagnosis present

## 2017-06-26 DIAGNOSIS — M25511 Pain in right shoulder: Secondary | ICD-10-CM | POA: Diagnosis present

## 2017-06-26 DIAGNOSIS — Z7982 Long term (current) use of aspirin: Secondary | ICD-10-CM

## 2017-06-26 DIAGNOSIS — Z794 Long term (current) use of insulin: Secondary | ICD-10-CM | POA: Diagnosis not present

## 2017-06-26 DIAGNOSIS — Z7902 Long term (current) use of antithrombotics/antiplatelets: Secondary | ICD-10-CM

## 2017-06-26 DIAGNOSIS — Z91048 Other nonmedicinal substance allergy status: Secondary | ICD-10-CM

## 2017-06-26 DIAGNOSIS — E785 Hyperlipidemia, unspecified: Secondary | ICD-10-CM | POA: Diagnosis present

## 2017-06-26 DIAGNOSIS — E118 Type 2 diabetes mellitus with unspecified complications: Secondary | ICD-10-CM | POA: Diagnosis present

## 2017-06-26 DIAGNOSIS — Z881 Allergy status to other antibiotic agents status: Secondary | ICD-10-CM

## 2017-06-26 DIAGNOSIS — Z885 Allergy status to narcotic agent status: Secondary | ICD-10-CM

## 2017-06-26 LAB — GLUCOSE, CAPILLARY
Glucose-Capillary: 205 mg/dL — ABNORMAL HIGH (ref 65–99)
Glucose-Capillary: 195 mg/dL — ABNORMAL HIGH (ref 65–99)

## 2017-06-26 LAB — CBC WITH DIFFERENTIAL/PLATELET
Basophils Absolute: 0 10*3/uL (ref 0–0.1)
Basophils Relative: 1 %
EOS ABS: 0.2 10*3/uL (ref 0–0.7)
EOS PCT: 5 %
HCT: 32.7 % — ABNORMAL LOW (ref 35.0–47.0)
Hemoglobin: 10.8 g/dL — ABNORMAL LOW (ref 12.0–16.0)
LYMPHS ABS: 2 10*3/uL (ref 1.0–3.6)
LYMPHS PCT: 38 %
MCH: 27.7 pg (ref 26.0–34.0)
MCHC: 32.9 g/dL (ref 32.0–36.0)
MCV: 84.1 fL (ref 80.0–100.0)
MONO ABS: 0.8 10*3/uL (ref 0.2–0.9)
MONOS PCT: 17 %
Neutro Abs: 2 10*3/uL (ref 1.4–6.5)
Neutrophils Relative %: 39 %
PLATELETS: 217 10*3/uL (ref 150–440)
RBC: 3.89 MIL/uL (ref 3.80–5.20)
RDW: 17.4 % — AB (ref 11.5–14.5)
WBC: 5.1 10*3/uL (ref 3.6–11.0)

## 2017-06-26 LAB — COMPREHENSIVE METABOLIC PANEL
ALK PHOS: 52 U/L (ref 38–126)
ALT: 17 U/L (ref 14–54)
ANION GAP: 10 (ref 5–15)
AST: 32 U/L (ref 15–41)
Albumin: 4.6 g/dL (ref 3.5–5.0)
BILIRUBIN TOTAL: 1 mg/dL (ref 0.3–1.2)
BUN: 15 mg/dL (ref 6–20)
CALCIUM: 9.1 mg/dL (ref 8.9–10.3)
CO2: 21 mmol/L — AB (ref 22–32)
CREATININE: 0.82 mg/dL (ref 0.44–1.00)
Chloride: 98 mmol/L — ABNORMAL LOW (ref 101–111)
GFR calc non Af Amer: >60 mL/min (ref >60)
Glucose, Bld: 220 mg/dL — ABNORMAL HIGH (ref 65–99)
Potassium: 4.6 mmol/L (ref 3.5–5.1)
Sodium: 129 mmol/L — ABNORMAL LOW (ref 135–145)
TOTAL PROTEIN: 8.5 g/dL — AB (ref 6.5–8.1)

## 2017-06-26 LAB — TROPONIN I
Troponin I: <0.03 ng/mL (ref <0.03)
Troponin I: <0.03 ng/mL (ref <0.03)

## 2017-06-26 LAB — LIPID PANEL
CHOL/HDL RATIO: 3.3 ratio
CHOLESTEROL: 158 mg/dL (ref 0–200)
HDL: 48 mg/dL (ref >40)
LDL Cholesterol: 47 mg/dL (ref 0–99)
TRIGLYCERIDES: 316 mg/dL — AB (ref <150)
VLDL: 63 mg/dL — AB (ref 0–40)

## 2017-06-26 LAB — APTT: aPTT: 30 seconds (ref 24–36)

## 2017-06-26 LAB — PROTIME-INR
INR: 1.06
PROTHROMBIN TIME: 13.7 s (ref 11.4–15.2)

## 2017-06-26 LAB — MRSA PCR SCREENING: MRSA BY PCR: NEGATIVE

## 2017-06-26 SURGERY — Surgical Case

## 2017-06-26 MED ORDER — INSULIN ASPART 100 UNIT/ML ~~LOC~~ SOLN
0.0000 [IU] | Freq: Three times a day (TID) | SUBCUTANEOUS | Status: DC
  Administered 2017-06-27: 2 [IU] via SUBCUTANEOUS
  Administered 2017-06-27 – 2017-06-28 (×2): 3 [IU] via SUBCUTANEOUS
  Administered 2017-06-29: 2 [IU] via SUBCUTANEOUS
  Filled 2017-06-26 (×4): qty 1

## 2017-06-26 MED ORDER — ACETAMINOPHEN 325 MG PO TABS: 650.0000 mg | ORAL_TABLET | Freq: Four times a day (QID) | ORAL | Status: DC | PRN

## 2017-06-26 MED ORDER — TIMOLOL MALEATE 0.5 % OP SOLN
1.0000 [drp] | Freq: Two times a day (BID) | OPHTHALMIC | Status: DC
  Administered 2017-06-26 – 2017-06-29 (×6): 1 [drp] via OPHTHALMIC
  Filled 2017-06-26: qty 5

## 2017-06-26 MED ORDER — BISACODYL 10 MG RE SUPP: 10.0000 mg | Freq: Every day | RECTAL | Status: DC | PRN

## 2017-06-26 MED ORDER — ONDANSETRON HCL 4 MG/2ML IJ SOLN
4.0000 mg | Freq: Four times a day (QID) | INTRAMUSCULAR | Status: DC | PRN
  Administered 2017-06-27: 4 mg via INTRAVENOUS
  Filled 2017-06-26: qty 2

## 2017-06-26 MED ORDER — LAMOTRIGINE 25 MG PO TABS: 50.0000 mg | ORAL_TABLET | Freq: Two times a day (BID) | ORAL | Status: DC

## 2017-06-26 MED ORDER — LAMOTRIGINE 25 MG PO TABS
50.0000 mg | ORAL_TABLET | Freq: Two times a day (BID) | ORAL | Status: DC
  Administered 2017-06-26 – 2017-06-29 (×6): 50 mg via ORAL
  Filled 2017-06-26 (×6): qty 2

## 2017-06-26 MED ORDER — FENTANYL CITRATE (PF) 100 MCG/2ML IJ SOLN
50.0000 ug | Freq: Once | INTRAMUSCULAR | Status: AC
  Administered 2017-06-26: 50 ug via INTRAVENOUS
  Filled 2017-06-26: qty 2

## 2017-06-26 MED ORDER — NITROGLYCERIN 5 MG/ML IV SOLN
INTRAVENOUS | Status: AC
  Filled 2017-06-26: qty 10

## 2017-06-26 MED ORDER — HEPARIN SODIUM (PORCINE) 5000 UNIT/ML IJ SOLN
INTRAMUSCULAR | Status: AC
  Filled 2017-06-26: qty 1

## 2017-06-26 MED ORDER — MIDAZOLAM HCL 2 MG/2ML IJ SOLN
INTRAMUSCULAR | Status: AC
  Filled 2017-06-26: qty 2

## 2017-06-26 MED ORDER — SODIUM CHLORIDE 0.9 % IV BOLUS (SEPSIS)
500.0000 mL | Freq: Once | INTRAVENOUS | Status: AC
  Administered 2017-06-26: 500 mL via INTRAVENOUS

## 2017-06-26 MED ORDER — SODIUM CHLORIDE 0.9 % IV SOLN
INTRAVENOUS | Status: DC
  Administered 2017-06-26 – 2017-06-27 (×2): via INTRAVENOUS

## 2017-06-26 MED ORDER — BRIMONIDINE TARTRATE 0.2 % OP SOLN
1.0000 [drp] | Freq: Two times a day (BID) | OPHTHALMIC | Status: DC
  Filled 2017-06-26: qty 5

## 2017-06-26 MED ORDER — ASPIRIN EC 81 MG PO TBEC: 81.0000 mg | DELAYED_RELEASE_TABLET | Freq: Every day | ORAL | Status: DC

## 2017-06-26 MED ORDER — CLOPIDOGREL BISULFATE 75 MG PO TABS
75.0000 mg | ORAL_TABLET | ORAL | Status: DC
  Administered 2017-06-27 – 2017-06-29 (×3): 75 mg via ORAL
  Filled 2017-06-26 (×3): qty 1

## 2017-06-26 MED ORDER — NITROGLYCERIN IN D5W 200-5 MCG/ML-% IV SOLN
0.0000 ug/min | Freq: Once | INTRAVENOUS | Status: AC
  Administered 2017-06-26: 10 ug/min via INTRAVENOUS

## 2017-06-26 MED ORDER — PHENYLEPHRINE HCL 10 MG/ML IJ SOLN
0.0000 ug/min | INTRAMUSCULAR | Status: DC
  Administered 2017-06-26: 10 ug/min via INTRAVENOUS
  Filled 2017-06-26: qty 10

## 2017-06-26 MED ORDER — ONDANSETRON HCL 4 MG PO TABS: 4.0000 mg | ORAL_TABLET | Freq: Four times a day (QID) | ORAL | Status: DC | PRN

## 2017-06-26 MED ORDER — ACETAMINOPHEN 650 MG RE SUPP: 650.0000 mg | Freq: Four times a day (QID) | RECTAL | Status: DC | PRN

## 2017-06-26 MED ORDER — METOPROLOL TARTRATE 25 MG PO TABS: 25.0000 mg | ORAL_TABLET | Freq: Every day | ORAL | Status: DC

## 2017-06-26 MED ORDER — INSULIN GLARGINE 100 UNIT/ML ~~LOC~~ SOLN
24.0000 [IU] | Freq: Every day | SUBCUTANEOUS | Status: DC
  Filled 2017-06-26: qty 0.24

## 2017-06-26 MED ORDER — METOPROLOL TARTRATE 25 MG PO TABS
25.0000 mg | ORAL_TABLET | Freq: Every day | ORAL | Status: DC
  Administered 2017-06-26: 25 mg via ORAL
  Filled 2017-06-26: qty 1

## 2017-06-26 MED ORDER — HEPARIN SODIUM (PORCINE) 1000 UNIT/ML IJ SOLN
INTRAMUSCULAR | Status: AC
  Filled 2017-06-26: qty 1

## 2017-06-26 MED ORDER — DOCUSATE SODIUM 100 MG PO CAPS
100.0000 mg | ORAL_CAPSULE | Freq: Two times a day (BID) | ORAL | Status: DC
  Administered 2017-06-27 – 2017-06-29 (×5): 100 mg via ORAL
  Filled 2017-06-26 (×5): qty 1

## 2017-06-26 MED ORDER — BRIMONIDINE TARTRATE 0.2 % OP SOLN
1.0000 [drp] | Freq: Two times a day (BID) | OPHTHALMIC | Status: DC
  Administered 2017-06-26 – 2017-06-29 (×6): 1 [drp] via OPHTHALMIC
  Filled 2017-06-26: qty 5

## 2017-06-26 MED ORDER — HEPARIN (PORCINE) IN NACL 100-0.45 UNIT/ML-% IJ SOLN
12.0000 [IU]/kg/h | INTRAMUSCULAR | Status: DC
  Administered 2017-06-26: 12 [IU]/kg/h via INTRAVENOUS
  Filled 2017-06-26: qty 250

## 2017-06-26 MED ORDER — ASPIRIN EC 81 MG PO TBEC
81.0000 mg | DELAYED_RELEASE_TABLET | Freq: Every day | ORAL | Status: DC
  Administered 2017-06-26 – 2017-06-28 (×3): 81 mg via ORAL
  Filled 2017-06-26 (×3): qty 1

## 2017-06-26 MED ORDER — PANTOPRAZOLE SODIUM 40 MG IV SOLR
40.0000 mg | Freq: Two times a day (BID) | INTRAVENOUS | Status: DC
  Administered 2017-06-26 – 2017-06-27 (×2): 40 mg via INTRAVENOUS
  Filled 2017-06-26 (×2): qty 40

## 2017-06-26 MED ORDER — FENTANYL CITRATE (PF) 100 MCG/2ML IJ SOLN
INTRAMUSCULAR | Status: AC
  Filled 2017-06-26: qty 2

## 2017-06-26 MED ORDER — ATORVASTATIN CALCIUM 20 MG PO TABS
40.0000 mg | ORAL_TABLET | Freq: Every day | ORAL | Status: DC
  Administered 2017-06-26 – 2017-06-27 (×2): 40 mg via ORAL
  Filled 2017-06-26 (×2): qty 2

## 2017-06-26 MED ORDER — INSULIN GLARGINE 100 UNIT/ML ~~LOC~~ SOLN
24.0000 [IU] | Freq: Every day | SUBCUTANEOUS | Status: DC
  Administered 2017-06-26 – 2017-06-27 (×2): 24 [IU] via SUBCUTANEOUS
  Filled 2017-06-26 (×3): qty 0.24

## 2017-06-26 MED ORDER — GLIPIZIDE ER 10 MG PO TB24
10.0000 mg | ORAL_TABLET | Freq: Every morning | ORAL | Status: DC
  Administered 2017-06-27 – 2017-06-29 (×3): 10 mg via ORAL
  Filled 2017-06-26 (×3): qty 1

## 2017-06-26 MED ORDER — ATORVASTATIN CALCIUM 20 MG PO TABS: 40.0000 mg | ORAL_TABLET | Freq: Every day | ORAL | Status: DC

## 2017-06-26 MED ORDER — HEPARIN SODIUM (PORCINE) 5000 UNIT/ML IJ SOLN
60.0000 [IU]/kg | Freq: Once | INTRAMUSCULAR | Status: AC
  Administered 2017-06-26: 3894 [IU] via INTRAVENOUS

## 2017-06-26 MED ORDER — LIDOCAINE HCL (PF) 1 % IJ SOLN
INTRAMUSCULAR | Status: AC
  Filled 2017-06-26: qty 30

## 2017-06-26 MED ORDER — NITROGLYCERIN IN D5W 200-5 MCG/ML-% IV SOLN
INTRAVENOUS | Status: AC
  Filled 2017-06-26: qty 250

## 2017-06-26 MED ORDER — ONDANSETRON HCL 4 MG/2ML IJ SOLN
4.0000 mg | Freq: Once | INTRAMUSCULAR | Status: AC
  Administered 2017-06-26: 4 mg via INTRAVENOUS
  Filled 2017-06-26: qty 2

## 2017-06-26 MED ORDER — MORPHINE SULFATE (PF) 2 MG/ML IV SOLN: 2.0000 mg | INTRAVENOUS | Status: DC | PRN

## 2017-06-26 MED ORDER — METOPROLOL TARTRATE 5 MG/5ML IV SOLN
5.0000 mg | Freq: Once | INTRAVENOUS | Status: AC
  Administered 2017-06-26: 5 mg via INTRAVENOUS

## 2017-06-26 MED ORDER — LEVETIRACETAM 750 MG PO TABS
750.0000 mg | ORAL_TABLET | Freq: Two times a day (BID) | ORAL | Status: DC
  Administered 2017-06-26 – 2017-06-29 (×6): 750 mg via ORAL
  Filled 2017-06-26 (×6): qty 1

## 2017-06-26 MED ORDER — LEVETIRACETAM 750 MG PO TABS
750.0000 mg | ORAL_TABLET | Freq: Two times a day (BID) | ORAL | Status: DC
  Filled 2017-06-26: qty 1

## 2017-06-26 MED ORDER — VERAPAMIL HCL 2.5 MG/ML IV SOLN
INTRAVENOUS | Status: AC
  Filled 2017-06-26: qty 2

## 2017-06-26 MED ORDER — PANTOPRAZOLE SODIUM 40 MG IV SOLR: 40.0000 mg | Freq: Two times a day (BID) | INTRAVENOUS | Status: DC

## 2017-06-26 MED ORDER — DOCUSATE SODIUM 100 MG PO CAPS: 100.0000 mg | ORAL_CAPSULE | Freq: Two times a day (BID) | ORAL | Status: DC

## 2017-06-26 MED ORDER — TIMOLOL MALEATE 0.5 % OP SOLN
1.0000 [drp] | Freq: Two times a day (BID) | OPHTHALMIC | Status: DC
  Filled 2017-06-26: qty 5

## 2017-06-26 MED ORDER — NITROGLYCERIN 0.4 MG SL SUBL
0.4000 mg | SUBLINGUAL_TABLET | SUBLINGUAL | Status: DC | PRN
  Administered 2017-06-26 – 2017-06-27 (×2): 0.4 mg via SUBLINGUAL
  Filled 2017-06-26: qty 1

## 2017-06-26 NOTE — Progress Notes (Addendum)
ANTICOAGULATION CONSULT NOTE - Initial Consult  Pharmacy Consult for heparin gtt Indication: chest pain/ACS  Allergies  Allergen Reactions  . Talwin [Pentazocine] Anaphylaxis and Swelling    Throat swelling  . Valium [Diazepam] Other (See Comments)    Makes her feel like she's flying  . Vicodin [Hydrocodone-Acetaminophen] Nausea And Vomiting  . Adhesive [Tape] Other (See Comments)    Bruising and TEARING!!!  Please use "paper" tape  . Beta Adrenergic Blockers Other (See Comments)    Pt states she not allergic  . Levofloxacin Swelling and Other (See Comments)    Tongue swells   . Simvastatin Swelling and Rash    Mouth swelling    Patient Measurements: Height: 5\' 3"  (160 cm) Weight: 143 lb (64.9 kg) IBW/kg (Calculated) : 52.4 Heparin Dosing Weight: 64.9kg   Vital Signs: Temp: 97.8 F (36.6 C) (11/03 1621) Temp Source: Oral (11/03 1621) BP: 154/76 (11/03 1715) Pulse Rate: 62 (11/03 1715)  Labs:  Recent Labs  06/26/17 1608  HGB 10.8*  HCT 32.7*  PLT 217  APTT 30  LABPROT 13.7  INR 1.06  CREATININE 0.82  TROPONINI <0.03    Estimated Creatinine Clearance: 52.1 mL/min (by C-G formula based on SCr of 0.82 mg/dL).   Medical History: Past Medical History:  Diagnosis Date  . Acid reflux   . Arthritis    "all over"  . Chronic stable angina (HCC)   . Coronary artery disease   . Hypercholesterolemia   . Hypertension   . Myocardial infarction (HCC) 1991; 07/06/2015  . NSTEMI (non-ST elevated myocardial infarction) (HCC) 10/22/2015  . Psoriatic arthritis (HCC)   . 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 TIA (transient ischemic attack)    "several over the last 20 years" (10/22/2015)  . Type II diabetes mellitus (HCC)     Medications:   (Not in a hospital admission) Scheduled:  . heparin      . [START ON 06/27/2017] insulin aspart  0-9 Units Subcutaneous TID WC  . ondansetron (ZOFRAN) IV  4 mg  Intravenous Once   Infusions:  . sodium chloride    . heparin    . nitroGLYCERIN 40 mcg/min (06/26/17 1712)   PRN: morphine injection, nitroGLYCERIN Anti-infectives    None      Assessment: 76 year old female with NSTEMI, pharmacy consulted for heparin gtt dosing after provider had first ordered heparin bolus of 3894units.    Goal of Therapy:  Heparin level 0.3-0.7 units/ml Monitor platelets by anticoagulation protocol: Yes   Plan:  Give 3894 (given already) units bolus x 1 Start heparin infusion at 900 units/hr Check anti-Xa level in 8 hours and daily while on heparin Continue to monitor H&H and platelets  Garrett Coffee 06/26/2017,5:53 PM   11/04 0120 heparin level 0.48. Continue current regimen. Recheck heparin level in 8 hours to confirm.  Fulton ReekMatt Sayre Witherington, PharmD, BCPS  06/27/17 3:13 AM

## 2017-06-26 NOTE — ED Provider Notes (Signed)
[facility] Emergency Department Provider Note  ____________________________________________   I have reviewed the triage vital signs and the nursing notes.   HISTORY  Chief Complaint Chest Pain    HPI Leah Small is a 76 y.o. female with a very significant coronary heart history which I have reviewed including stents and occlusions to her grafts etc.  Patient has her most recent cardiac cath in 2017, November, presents today with what she describes as her anginal equivalent.  Substernal rating to the right chest.  Happened at rest gradual onset did not radiate to the back.  Not tearing.  Positive shortness of breath.  Did start after a coughing fit.  Has been compliant with her medications.  States it feels like her normal angina.  Pressure, severe, began less than an hour prior to arrival.  EMS noted ST changes that were concerning the initiated code STEMI, they did give her aspirin and nitro en route    Past Medical History:  Diagnosis Date  . Acid reflux   . Arthritis    "all over"  . Chronic stable angina (HCC)   . Coronary artery disease   . Hypercholesterolemia   . Hypertension   . Myocardial infarction (HCC) 1991; 07/06/2015  . NSTEMI (non-ST elevated myocardial infarction) (HCC) 10/22/2015  . Psoriatic arthritis (HCC)   . 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 TIA (transient ischemic attack)    "several over the last 20 years" (10/22/2015)  . Type II diabetes mellitus [facility])     Patient Active Problem List   Diagnosis Date Noted  . Carotid stenosis 11/12/2016  . Hypertensive heart disease 01/29/2016  . Hypertensive urgency 01/29/2016  . Normocytic anemia 01/29/2016  . History of vaginal bleeding 01/29/2016  . GERD (gastroesophageal reflux disease) 01/29/2016  . NSTEMI (non-ST elevated myocardial infarction) (HCC) 10/22/2015  . Non-STEMI (non-ST elevated myocardial infarction)  (HCC) 10/22/2015  . 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 Coronary atherosclerosis of native coronary artery 10/22/2015  . Angina pectoris (HCC)   . Memory deficit   . Confusional state   . Sepsis (HCC) 07/06/2015  . Stroke (HCC) 07/06/2015  . Diabetes mellitus with complication (HCC) 07/06/2015  . Essential hypertension 07/06/2015  . Hyperlipidemia 07/06/2015  . Unstable angina pectoris (HCC) 07/06/2015  . Left-sided weakness   . Stroke-like symptoms     Past Surgical History:  Procedure Laterality Date  . ABDOMINAL HYSTERECTOMY  1973  . APPENDECTOMY  1950s   elementary school  . CARDIAC CATHETERIZATION  X 2-3  . CARDIAC CATHETERIZATION N/A 11/21/2015   Procedure: Left Heart Cath and Coronary Angiography;  Surgeon: Corky Crafts, MD;  Location[facility] INVASIVE CV LAB;  Service: Cardiovascular;  Laterality: N/A;  . CARDIAC CATHETERIZATION  11/21/2015   Procedure: Coronary Stent Intervention;  Surgeon: Corky Crafts, MD;  Location: [facility] INVASIVE CV LAB;  Service: Cardiovascular;;  . CARDIAC CATHETERIZATION N/A 01/29/2016   Procedure: Left Heart Cath and Cors/Grafts Angiography;  Surgeon: Tonny Bollman, MD;  Location: [facility] INVASIVE CV LAB;  Service: Cardiovascular;  Laterality: N/A;  . CARDIAC CATHETERIZATION N/A 07/22/2016   Procedure: Left Heart Cath and Coronary Angiography;  Surgeon: Iran Ouch, MD;  Location: [facility] INVASIVE CV LAB;  Service: Cardiovascular;  Laterality: N/A;  . CAROTID STENT INSERTION Bilateral 2011-2012   right-left  . CATARACT EXTRACTION W/ INTRAOCULAR LENS  IMPLANT, BILATERAL Bilateral 2009-2010  . CHOLECYSTECTOMY OPEN  ~  2014  . CORONARY ANGIOPLASTY    . CORONARY ANGIOPLASTY WITH STENT PLACEMENT  11/21/2015  . CORONARY ARTERY BYPASS GRAFT  08/1995  . DILATION AND CURETTAGE OF UTERUS  X 1  . 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 TONSILLECTOMY  ~ 1947   2nd grade    Prior to  Admission medications   Medication Sig Start Date End Date Taking? Authorizing Provider  Adalimumab (HUMIRA PEN [place]) Inject 1 application into the skin See admin instructions. Patient takes it twice monthly.The next one is due June 26th    [provider]  aspirin EC 81 MG tablet Take 81 mg by mouth at bedtime.    [provider]  atorvastatin (LIPITOR) 80 MG tablet Take 1 tablet (80 mg total) by mouth at bedtime. Patient taking differently: Take 40 mg by mouth at bedtime.  11/22/15   Rai, Ripudeep K, MD  brimonidine (ALPHAGAN) 0.2 % ophthalmic solution Place 1 drop into both eyes 2 (two) times daily.     [provider]  clopidogrel (PLAVIX) 75 MG tablet Take 75 mg by mouth every morning.     [provider]  gabapentin (NEURONTIN) 100 MG capsule Take 100-200 mg by mouth at bedtime as needed (for neuropathy).  03/26/16   [provider]  glipiZIDE (GLUCOTROL XL) 10 MG 24 hr tablet Take 10 mg by mouth every morning. 04/26/15   [provider]  Insulin Glargine (LANTUS SOLOSTAR) 100 UNIT/ML Solostar Pen Inject 24 Units into the skin at bedtime.    [provider]  insulin lispro (HUMALOG) 100 UNIT/ML injection Inject 6-8 Units into the skin 3 (three) times daily before meals. Per sliding scale: 6 units plus:   CBG 150-200 add 1 unit, 201-250 add 2 units, 251-300 add 3 units, 301-350 add 4 units 351-400 add 5 units, >400 add 6 units and call MD    [provider]  isosorbide mononitrate (IMDUR) 60 MG 24 hr tablet Take 30 mg by mouth 2 (two) times daily.  03/21/16   [provider]  levETIRAcetam (KEPPRA) 750 MG tablet Take 750 mg by mouth 2 (two) times daily.    [provider]  metFORMIN (GLUCOPHAGE) 1000 MG tablet Take 1 tablet (1,000 mg total) by mouth 2 (two) times daily. 01/30/16   Ok Anis, NP  metoprolol tartrate (LOPRESSOR) 25 MG tablet Take 1 tablet (25 mg total) by mouth at bedtime. 07/23/16   Little Ishikawa, NP  nitroGLYCERIN (NITRODUR - DOSED IN MG/24 HR) 0.4 mg/hr patch Place 0.4 mg onto the skin daily as needed (for chest pain).  02/19/16   [provider]  nitroGLYCERIN (NITROSTAT) 0.4 MG SL tablet Place 0.4 mg under the tongue every 5 (five) minutes as needed for chest pain.    [provider]  nystatin cream (MYCOSTATIN) Apply 1 application topically 2 (two) times daily as needed (yeast infection).  04/25/15   [provider]  ranitidine (ZANTAC) 150 MG tablet Take 150 mg by mouth every morning.     [provider]  triamcinolone ointment (KENALOG) 0.1 % Apply 1 application topically 3 (three) times daily as needed (itching from psoriasis).    [provider]  vitamin B-12 (CYANOCOBALAMIN) 1000 MCG tablet Take 1,000 mcg by mouth every morning.     [provider]  vitamin E 400 UNIT capsule Take 400 Units by mouth every morning.     [provider]  Allergies Talwin [pentazocine]; Valium [diazepam]; Vicodin [hydrocodone-acetaminophen]; Adhesive [tape]; Beta adrenergic blockers; Levofloxacin; and Simvastatin  Family History  Problem Relation Age of Onset  . Heart attack Father   . Diabetes Mother   . Cancer Mother   . Stroke Mother   . Breast cancer Cousin     Social History Social History  Substance Use Topics  . Smoking status: Never Smoker  . Smokeless tobacco: Never Used  . Alcohol use No    Review of Systems Constitutional: No fever/chills Eyes: No visual changes. ENT: No sore throat. No stiff neck no neck pain Cardiovascular: + chest pain. Respiratory: + shortness of breath. Gastrointestinal:   no vomiting.  No diarrhea.  No constipation. Genitourinary: Negative for dysuria. Musculoskeletal: Negative lower extremity swelling Skin: Negative for rash. Neurological: Negative for severe headaches, focal weakness or numbness.   ____________________________________________   PHYSICAL EXAM:  VITAL  SIGNS: ED Triage Vitals  Enc Vitals Group     BP --      Pulse Rate 06/26/17 1621 75     Resp 06/26/17 1621 16     Temp 06/26/17 1621 97.8 F (36.6 C)     Temp Source 06/26/17 1621 Oral     SpO2 --      Weight 06/26/17 1621 143 lb (64.9 kg)     Height 06/26/17 1621 5\' 3"  (1.6 m)     Head Circumference --      Peak Flow --      Pain Score 06/26/17 1618 9     Pain Loc --      Pain Edu? --      Excl. in GC? --     Constitutional: Alert and oriented. Well appearing and in no acute distress.  Quite calm and collected Eyes: Conjunctivae are normal Head: Atraumatic HEENT: No congestion/rhinnorhea. Mucous membranes are moist.  Oropharynx non-erythematous Neck:   Nontender with no meningismus, no masses, no stridor Cardiovascular: Normal rate, regular rhythm. Grossly normal heart sounds.  Good peripheral circulation. Chest: Tender to palpation the right chest wall which does seem to reproduce her chest pain, there is no flail chest there are no lesions noted Respiratory: Normal respiratory effort.  No retractions. Lungs CTAB. Abdominal: Soft and nontender. No distention. No guarding no rebound Back:  There is no focal tenderness or step off.  there is no midline tenderness there are no lesions noted. there is no CVA tenderness Musculoskeletal: No lower extremity tenderness, no upper extremity tenderness. No joint effusions, no DVT signs strong distal pulses no edema Neurologic:  Normal speech and language. No gross focal neurologic deficits are appreciated.  Skin:  Skin is warm, dry and intact. No rash noted. Psychiatric: Mood and affect are normal. Speech and behavior are normal.  ____________________________________________   LABS (all labs ordered are listed, but only abnormal results are displayed)  Labs Reviewed  CBC WITH DIFFERENTIAL/PLATELET - Abnormal; Notable for the following:       Result Value   Hemoglobin 10.8 (*)    HCT 32.7 (*)    RDW 17.4 (*)    All other  components within normal limits  PROTIME-INR  APTT  COMPREHENSIVE METABOLIC PANEL  TROPONIN I  LIPID PANEL    Pertinent labs  results that were available during my care of the patient were reviewed by me and considered in my medical decision making (see chart for details). ____________________________________________  EKG  I personally interpreted any EKGs ordered by me or triage Sinus rhythm, rate 73 bpm, acid  ST elevation in aVR, consistent with prior, normal axis, ST depression in lead II also noted.  Initial EKG, showed rate 74 but baseline artifact limiting interpretation, no significant change from second EKG. ____________________________________________  RADIOLOGY  Pertinent labs & imaging results that were available during my care of the patient were reviewed by me and considered in my medical decision making (see chart for details). If possible, patient and/or family made aware of any abnormal findings. ____________________________________________    PROCEDURES  Procedure(s) performed: None  Procedures  Critical Care performed: CRITICAL CARE Performed by: Jeanmarie PlantJAMES A Kyiah Canepa   Total critical care time: 52 minutes  Critical care time was exclusive of separately billable procedures and treating other patients.  Critical care was necessary to treat or prevent imminent or life-threatening deterioration.  Critical care was time spent personally by me on the following activities: development of treatment plan with patient and/or surrogate as well as nursing, discussions with consultants, evaluation of patient's response to treatment, examination of patient, obtaining history from patient or surrogate, ordering and performing treatments and interventions, ordering and review of laboratory studies, ordering and review of radiographic studies, pulse oximetry and re-evaluation of patient's condition.   ____________________________________________   INITIAL IMPRESSION /  ASSESSMENT AND PLAN / ED COURSE  Pertinent labs & imaging results that were available during my care of the patient were reviewed by me and considered in my medical decision making (see chart for details).  ----------------------------------------- 4:34 PM on 06/26/2017 -----------------------------------------  D/w dr. Excell Seltzerooper who agrees w/ heparin and ntg gtt which we are starting.   ----------------------------------------- 4:55 PM on 06/26/2017 -----------------------------------------  Cards feels medical mgt is best for optimizing her care.    ----------------------------------------- 5:08 PM on 06/26/2017 ----------------------------------------- Patient here with significant chest pain, she had some relief with nitro in route blood pressure is elevated cardiology, Dr. Excell Seltzerooper, at bedside appreciate consult.  He feels this is best managed medically.  Initial troponin is negative.  Her EKG does not meet STEMI criteria.  Most of her changes are old on our EKG.  We also reviewed EMS EKGs.  Patient has received nitro, heparin, and fentanyl her pain is greatly improved blood pressure is trending down.  We will continue her on a nitro drip patient will be admitted to the hospitalist for further evaluation   ____________________________________________   FINAL CLINICAL IMPRESSION(S) / ED DIAGNOSES  Final diagnoses:  STEMI (ST elevation myocardial infarction) [facility](HCC)      This chart was dictated using voice recognition software.  Despite best efforts to proofread,  errors can occur which can change meaning.      Jeanmarie PlantMcShane, Azalie Harbeck A, MD 06/26/17 [phone]

## 2017-06-26 NOTE — H&P (Signed)
History and Physical    Leah Small:[id number] DOB: [birth year] DOA: 06/26/2017  Referring physician: Dr. Alphonzo Lemmings PCP: Marguarite Arbour, MD  Specialists: Dr. Gwen Pounds  Chief Complaint: Chest pain  HPI: RYLEAH Small is a 76 y.o. female has a past medical history significant for CAD, HTN, DM and conversion d/o now with recurrent CP and non-specific EKG changes. BP elevated in ER. Still with intermittent pain. Troponin x 1 normal. Started on IV NTG and IV Heparin. She is now admitted. No fever. No N/V/D. Some SOB. Pain radiates to jaw and both shoulders. Some worsening with palpation  Review of Systems: The patient denies anorexia, fever, weight loss,, vision loss, decreased hearing, hoarseness,syncope, dyspnea on exertion, peripheral edema, balance deficits, hemoptysis, abdominal pain, melena, hematochezia, severe indigestion/heartburn, hematuria, incontinence, genital sores, muscle weakness, suspicious skin lesions, transient blindness, difficulty walking, depression, unusual weight change, abnormal bleeding, enlarged lymph nodes, angioedema, and breast masses.   Past Medical History:  Diagnosis Date  . Acid reflux   . Arthritis    "all over"  . Chronic stable angina (HCC)   . Coronary artery disease   . Hypercholesterolemia   . Hypertension   . Myocardial infarction (HCC) 1991; 07/06/2015  . NSTEMI (non-ST elevated myocardial infarction) (HCC) 10/22/2015  . Psoriatic arthritis (HCC)   . 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 TIA (transient ischemic attack)    "several over the last 20 years" (10/22/2015)  . Type II diabetes mellitus (HCC)    Past Surgical History:  Procedure Laterality Date  . ABDOMINAL HYSTERECTOMY  1973  . APPENDECTOMY  1950s   elementary school  . CARDIAC CATHETERIZATION  X 2-3  . CARDIAC CATHETERIZATION N/A 11/21/2015   Procedure: Left Heart Cath and Coronary Angiography;  Surgeon:  Corky Crafts, MD;  Location: [facility] INVASIVE CV LAB;  Service: Cardiovascular;  Laterality: N/A;  . CARDIAC CATHETERIZATION  11/21/2015   Procedure: Coronary Stent Intervention;  Surgeon: Corky Crafts, MD;  Location: [facility] INVASIVE CV LAB;  Service: Cardiovascular;;  . CARDIAC CATHETERIZATION N/A 01/29/2016   Procedure: Left Heart Cath and Cors/Grafts Angiography;  Surgeon: Tonny Bollman, MD;  Location: [facility] INVASIVE CV LAB;  Service: Cardiovascular;  Laterality: N/A;  . CARDIAC CATHETERIZATION N/A 07/22/2016   Procedure: Left Heart Cath and Coronary Angiography;  Surgeon: Iran Ouch, MD;  Location: [facility] INVASIVE CV LAB;  Service: Cardiovascular;  Laterality: N/A;  . CAROTID STENT INSERTION Bilateral 2011-2012   right-left  . CATARACT EXTRACTION W/ INTRAOCULAR LENS  IMPLANT, BILATERAL Bilateral 2009-2010  . CHOLECYSTECTOMY OPEN  ~ 2014  . CORONARY ANGIOPLASTY    . CORONARY ANGIOPLASTY WITH STENT PLACEMENT  11/21/2015  . CORONARY ARTERY BYPASS GRAFT  08/1995  . DILATION AND CURETTAGE OF UTERUS  X 1  . 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 TONSILLECTOMY  ~ 1947   2nd grade   Social History:  reports that she has never smoked. She has never used smokeless tobacco. She reports that she does not drink alcohol or use drugs.  Allergies  Allergen Reactions  . Talwin [Pentazocine] Anaphylaxis and Swelling    Throat swelling  . Valium [Diazepam] Other (See Comments)    Makes her feel like she's flying  . Vicodin [Hydrocodone-Acetaminophen] Nausea And Vomiting  . Adhesive [Tape] Other (See Comments)    Bruising and TEARING!!!  Please use "paper" tape  .  Beta Adrenergic Blockers Other (See Comments)    Pt states she not allergic  . Levofloxacin Swelling and Other (See Comments)    Tongue swells   . Simvastatin Swelling and Rash    Mouth swelling    Family History  Problem Relation Age of Onset  . Heart attack Father   .  Diabetes Mother   . Cancer Mother   . Stroke Mother   . Breast cancer Cousin     Prior to Admission medications   Medication Sig Start Date End Date Taking? Authorizing Provider  Adalimumab (HUMIRA PEN [place]) Inject 1 application into the skin See admin instructions. Patient takes it twice monthly.The next one is due June 26th   Yes [provider]  aspirin EC 81 MG tablet Take 81 mg by mouth at bedtime.   Yes [provider]  atorvastatin (LIPITOR) 80 MG tablet Take 1 tablet (80 mg total) by mouth at bedtime. Patient taking differently: Take 40 mg by mouth at bedtime.  11/22/15  Yes Rai, Ripudeep K, MD  brimonidine (ALPHAGAN) 0.2 % ophthalmic solution Place 1 drop into both eyes 2 (two) times daily.    Yes [provider]  clopidogrel (PLAVIX) 75 MG tablet Take 75 mg by mouth every morning.    Yes [provider]  glipiZIDE (GLUCOTROL XL) 10 MG 24 hr tablet Take 10 mg by mouth every morning. 04/26/15  Yes [provider]  Insulin Glargine (LANTUS SOLOSTAR) 100 UNIT/ML Solostar Pen Inject 24 Units into the skin at bedtime.   Yes [provider]  insulin lispro (HUMALOG) 100 UNIT/ML injection Inject 6-8 Units into the skin 3 (three) times daily before meals. Per sliding scale: 6 units plus:   CBG 150-200 add 1 unit, 201-250 add 2 units, 251-300 add 3 units, 301-350 add 4 units 351-400 add 5 units, >400 add 6 units and call MD   Yes [provider]  isosorbide mononitrate (IMDUR) 60 MG 24 hr tablet Take 60 mg by mouth 2 (two) times daily.  03/21/16  Yes [provider]  lamoTRIgine (LAMICTAL) 25 MG tablet Take 50 mg by mouth 2 (two) times daily. 06/14/17  Yes [provider]  levETIRAcetam (KEPPRA) 750 MG tablet Take 750 mg by mouth 2 (two) times daily.   Yes [provider]  metFORMIN (GLUCOPHAGE) 1000 MG tablet Take 1 tablet (1,000 mg total) by mouth 2 (two) times daily. 01/30/16  Yes Ok Anis, NP   metoprolol tartrate (LOPRESSOR) 25 MG tablet Take 1 tablet (25 mg total) by mouth at bedtime. 07/23/16  Yes Little Ishikawa, NP  ranitidine (ZANTAC) 150 MG tablet Take 150 mg by mouth every morning.    Yes [provider]  timolol (TIMOPTIC) 0.5 % ophthalmic solution Place 1 drop into both eyes 2 (two) times daily. 06/14/17  Yes [provider]  triamcinolone ointment (KENALOG) 0.1 % Apply 1 application topically 3 (three) times daily as needed (itching from psoriasis).   Yes [provider]  vitamin B-12 (CYANOCOBALAMIN) 1000 MCG tablet Take 1,000 mcg by mouth every morning.    Yes [provider]  vitamin E 400 UNIT capsule Take 400 Units by mouth every morning.    Yes [provider]  nitroGLYCERIN (NITRODUR - DOSED IN MG/24 HR) 0.4 mg/hr patch Place 0.4 mg onto the skin daily as needed (for chest pain).  02/19/16   [provider]  nitroGLYCERIN (NITROSTAT) 0.4 MG SL tablet Place 0.4 mg under the tongue every  5 (five) minutes as needed for chest pain.    [provider]  nystatin cream (MYCOSTATIN) Apply 1 application topically 2 (two) times daily as needed (yeast infection).  04/25/15   [provider]   Physical Exam: Vitals:   06/26/17 1639 06/26/17 1641 06/26/17 1707 06/26/17 1710  BP: (!) 166/62 (!) 183/84 (!) 203/99 (!) 180/95  Pulse: 73  71 (!) 59  Resp: 20 (!) 22 (!) 22 15  Temp:      TempSrc:      SpO2: 99% 100% 96% 97%  Weight:      Height:         General:  No apparent distress, [place]/AT, WDWN  Eyes: PERRL, EOMI, no scleral icterus, conjunctiva clear  ENT: moist oropharynx without exudate, TM's benign, dentition good  Neck: supple, no lymphadenopathy. No bruits or thyromegaly  Cardiovascular: regular rate without MRG; 2+ peripheral pulses, no JVD, no peripheral edema  Respiratory: CTA biL, good air movement without wheezing, rhonchi or crackled. Respiratory effort normal  Abdomen: soft, non tender to  palpation, positive bowel sounds, no guarding, no rebound  Skin: no rashes or lesions  Musculoskeletal: normal bulk and tone, no joint swelling  Psychiatric: normal mood and affect, A&OX3  Neurologic: CN 2-12 grossly intact, Motor strength 5/5 in all 4 groups with symmetric DTR's and non-focal sensory exam  Labs on Admission:  Basic Metabolic Panel:  Recent Labs Lab 06/26/17 1608  NA 129*  K 4.6  CL 98*  CO2 21*  GLUCOSE 220*  BUN 15  CREATININE 0.82  CALCIUM 9.1   Liver Function Tests:  Recent Labs Lab 06/26/17 1608  AST 32  ALT 17  ALKPHOS 52  BILITOT 1.0  PROT 8.5*  ALBUMIN 4.6   No results for input(s): LIPASE, AMYLASE in the last 168 hours. No results for input(s): AMMONIA in the last 168 hours. CBC:  Recent Labs Lab 06/26/17 1608  WBC 5.1  NEUTROABS 2.0  HGB 10.8*  HCT 32.7*  MCV 84.1  PLT 217   Cardiac Enzymes:  Recent Labs Lab 06/26/17 1608  TROPONINI <0.03    BNP (last 3 results)  Recent Labs  09/02/16 1918  BNP 305.3*    ProBNP (last 3 results) No results for input(s): PROBNP in the last 8760 hours.  CBG: No results for input(s): GLUCAP in the last 168 hours.  Radiological Exams on Admission: Dg Chest Portable 1 View  Result Date: 06/26/2017 CLINICAL DATA:  chest pain, non smoker. Hx of CAD, bypass 1997, NSTEMI 02- 2017, angio with stent placement 10-2015 EXAM: PORTABLE CHEST 1 VIEW COMPARISON:  09/02/2016 FINDINGS: Stable changes from prior CABG surgery. Cardiac silhouette is normal in size. No mediastinal or hilar masses. No evidence of adenopathy. Clear lungs. No pleural effusion or pneumothorax. Skeletal structures are demineralized but grossly intact. IMPRESSION: No acute cardiopulmonary disease. Electronically Signed   By: Amie Portlandavid  Ormond M.D.   On: 06/26/2017 17:06    EKG: Independently reviewed.  Assessment/Plan Principal Problem:   Unstable angina pectoris [facility](HCC) Active Problems:   Diabetes mellitus with complication  [facility](HCC)   Essential hypertension   Coronary atherosclerosis of native coronary artery   Will admit to Stepdown unit with IV NTG and IV Heparin. Follow enzymes. Follow sugars. Consult Cardiology. Echo ordered  Diet: clear liquids Fluids: NS@75  DVT Prophylaxis: IV Heparin  Code Status: DNR  Family Communication: yes  Disposition Plan: home  Time spent: 50 min

## 2017-06-26 NOTE — ED Triage Notes (Signed)
Arrives from home with c/o chest pain radiating to left shoulder.  Describes pain as crushing.  Onset of symptoms 1 hour PTA.  1 spray of NTG given and 324 mg Baby ASA given by EMS PTA.  Patient felt no relief of pain with interventions.

## 2017-06-26 NOTE — Consult Note (Addendum)
Name: Leah FishmanDonnie R Pollio MRN: [id number] DOB: 1940/09/29    ADMISSION DATE:  06/26/2017 CONSULTATION DATE: 06/26/2017  REFERRING MD : Dr. Judithann SheenSparks  CHIEF COMPLAINT: Chest Pain   BRIEF PATIENT DESCRIPTION: 76 yo female admitted 11/3 with unstable angina and hypertensive urgency requiring nitroglycerin gtt   SIGNIFICANT EVENTS  11/3-Pt admitted to the stepdown unit   STUDIES:  Echo>>  HISTORY OF PRESENT ILLNESS:   This is a 76 yo female with a PMH of Type II DM, TIA, Seizures, Psoriatic Arthritis, NSTEMI, MI (1991), Remote CABG, HTN, Hypercholesterolemia, CAD, Chronic Stable Angina, Arthritis, and GERD.  [facility] ER 11/3 with c/o a coughing spell and development of non radiating severe chest pressure in the center of her chest similar to chronic angina symptoms.  Due to symptoms EMS was notified and upon arrival to the ER a code STEMI was initiated due to EKG revealing ST elevation isolated in aVR.  It was also noted the pts systolic bp was significantly elevated at 220 mmHg.  In the ER she received sublingual nitro with slight improvement of pain and a nitroglycerin gtt was started. Cardiology evaluated pt and EKG determining the EKG was not a diagnostic STEMI, and placed pt on a heparin gtt.  [facility] team for further workup and treatment PCCM consulted.    PAST MEDICAL HISTORY :   has a past medical history of Acid reflux; Arthritis; Chronic stable angina (HCC); Coronary artery disease; Hypercholesterolemia; Hypertension; Myocardial infarction [facility](HCC) (1991; 07/06/2015); NSTEMI (non-ST elevated myocardial infarction) (HCC) (10/22/2015); Psoriatic arthritis (HCC); Seizures (HCC); TIA (transient ischemic attack); and Type II diabetes mellitus (HCC).  has a past surgical history that includes Tonsillectomy (~ 1947); Appendectomy (1950s); Cholecystectomy open (~ 2014); Abdominal hysterectomy (1973); Dilation and curettage of uterus  (X 1); Coronary artery bypass graft (08/1995); Carotid stent insertion (Bilateral, 2011-2012); Cataract extraction w/ intraocular lens  implant, bilateral (Bilateral, 2009-2010); Thrombectomy femoral artery (Right, ~ 2015); Cardiac catheterization (X 2-3); Coronary angioplasty; Coronary angioplasty with stent (11/21/2015); Cardiac catheterization (N/A, 11/21/2015); Cardiac catheterization (11/21/2015); Cardiac catheterization (N/A, 01/29/2016); and Cardiac catheterization (N/A, 07/22/2016). Prior to Admission medications   Medication Sig Start Date End Date Taking? Authorizing Provider  Adalimumab (HUMIRA PEN [place]) Inject 1 application into the skin See admin instructions. Patient takes it twice monthly.The next one is due June 26th   Yes [provider]  aspirin EC 81 MG tablet Take 81 mg by mouth at bedtime.   Yes [provider]  atorvastatin (LIPITOR) 80 MG tablet Take 1 tablet (80 mg total) by mouth at bedtime. Patient taking differently: Take 40 mg by mouth at bedtime.  11/22/15  Yes Rai, Ripudeep K, MD  brimonidine (ALPHAGAN) 0.2 % ophthalmic solution Place 1 drop into both eyes 2 (two) times daily.    Yes [provider]  clopidogrel (PLAVIX) 75 MG tablet Take 75 mg by mouth every morning.    Yes [provider]  glipiZIDE (GLUCOTROL XL) 10 MG 24 hr tablet Take 10 mg by mouth every morning. 04/26/15  Yes [provider]  Insulin Glargine (LANTUS SOLOSTAR) 100 UNIT/ML Solostar Pen Inject 24 Units into the skin at bedtime.   Yes [provider]  insulin lispro (HUMALOG) 100 UNIT/ML injection Inject 6-8 Units into the skin 3 (three) times daily before meals. Per sliding scale: 6 units plus:   CBG 150-200 add 1 unit, 201-250 add 2 units, 251-300 add 3 units, 301-350 add 4  units 351-400 add 5 units, >400 add 6 units and call MD   Yes [provider]  isosorbide mononitrate (IMDUR) 60 MG 24 hr tablet Take 60 mg by mouth 2 (two) times daily.  03/21/16   Yes [provider]  lamoTRIgine (LAMICTAL) 25 MG tablet Take 50 mg by mouth 2 (two) times daily. 06/14/17  Yes [provider]  levETIRAcetam (KEPPRA) 750 MG tablet Take 750 mg by mouth 2 (two) times daily.   Yes [provider]  metFORMIN (GLUCOPHAGE) 1000 MG tablet Take 1 tablet (1,000 mg total) by mouth 2 (two) times daily. 01/30/16  Yes Ok Anis, NP  metoprolol tartrate (LOPRESSOR) 25 MG tablet Take 1 tablet (25 mg total) by mouth at bedtime. 07/23/16  Yes Little Ishikawa, NP  ranitidine (ZANTAC) 150 MG tablet Take 150 mg by mouth every morning.    Yes [provider]  timolol (TIMOPTIC) 0.5 % ophthalmic solution Place 1 drop into both eyes 2 (two) times daily. 06/14/17  Yes [provider]  triamcinolone ointment (KENALOG) 0.1 % Apply 1 application topically 3 (three) times daily as needed (itching from psoriasis).   Yes [provider]  vitamin B-12 (CYANOCOBALAMIN) 1000 MCG tablet Take 1,000 mcg by mouth every morning.    Yes [provider]  vitamin E 400 UNIT capsule Take 400 Units by mouth every morning.    Yes [provider]  nitroGLYCERIN (NITRODUR - DOSED IN MG/24 HR) 0.4 mg/hr patch Place 0.4 mg onto the skin daily as needed (for chest pain).  02/19/16   [provider]  nitroGLYCERIN (NITROSTAT) 0.4 MG SL tablet Place 0.4 mg under the tongue every 5 (five) minutes as needed for chest pain.    [provider]  nystatin cream (MYCOSTATIN) Apply 1 application topically 2 (two) times daily as needed (yeast infection).  04/25/15   [provider]   Allergies  Allergen Reactions  . Talwin [Pentazocine] Anaphylaxis and Swelling    Throat swelling  . Valium [Diazepam] Other (See Comments)    Makes her feel like she's flying  . Vicodin [Hydrocodone-Acetaminophen] Nausea And Vomiting  . Adhesive [Tape] Other (See Comments)    Bruising and TEARING!!!  Please use "paper" tape  . Beta  Adrenergic Blockers Other (See Comments)    Pt states she not allergic  . Levofloxacin Swelling and Other (See Comments)    Tongue swells   . Simvastatin Swelling and Rash    Mouth swelling    FAMILY HISTORY:  family history includes Breast cancer in her cousin; Cancer in her mother; Diabetes in her mother; Heart attack in her father; Stroke in her mother. SOCIAL HISTORY:  reports that she has never smoked. She has never used smokeless tobacco. She reports that she does not drink alcohol or use drugs.  REVIEW OF SYSTEMS: Positives in BOLD  Constitutional: Negative for fever, chills, weight loss, malaise/fatigue and diaphoresis.  HENT: Negative for hearing loss, ear pain, nosebleeds, congestion, sore throat, neck pain, tinnitus and ear discharge.   Eyes: Negative for blurred vision, double vision, photophobia, pain, discharge and redness.  Respiratory: intermittent cough, hemoptysis, sputum production, shortness of breath, wheezing and stridor.   Cardiovascular: chest pain, palpitations, orthopnea, claudication, leg swelling and PND.  Gastrointestinal: Negative for heartburn, nausea, vomiting, abdominal pain, diarrhea, constipation, blood in stool and melena.  Genitourinary: Negative for dysuria, urgency, frequency, hematuria and flank pain.  Musculoskeletal: Negative for myalgias, back pain, joint pain and falls.  Skin: Negative for  itching and rash.  Neurological: Negative for dizziness, tingling, tremors, sensory change, speech change, focal weakness, seizures, loss of consciousness, weakness and headaches.  Endo/Heme/Allergies: Negative for environmental allergies and polydipsia. Does not bruise/bleed easily.  SUBJECTIVE:  Pt states chest pain has significantly improved no other complaints at this time.  VITAL SIGNS: Temp:  [97.8 F (36.6 C)] 97.8 F (36.6 C) (11/03 1621) Pulse Rate:  [59-82] 60 (11/03 1830) Resp:  [11-22] 20 (11/03 1830) BP: (124-203)/(61-139) 124/61 (11/03  1830) SpO2:  [95 %-100 %] 95 % (11/03 1830) Weight:  [64.9 kg (143 lb)] 64.9 kg (143 lb) (11/03 1621)  PHYSICAL EXAMINATION: General: well developed, well nourished female, NAD  Neuro: alert and oriented, follows commands  HEENT: supple, no JVD Cardiovascular: s1s2, rrr, no M/R/G  Lungs: clear throughout, even, non labored  Abdomen: +BS x4, soft, non tender, non distended  Musculoskeletal: normal bulk and tone, no edema  Skin: intact no rashes or lesions    Recent Labs Lab 06/26/17 1608  NA 129*  K 4.6  CL 98*  CO2 21*  BUN 15  CREATININE 0.82  GLUCOSE 220*    Recent Labs Lab 06/26/17 1608  HGB 10.8*  HCT 32.7*  WBC 5.1  PLT 217   Dg Chest Portable 1 View  Result Date: 06/26/2017 CLINICAL DATA:  chest pain, non smoker. Hx of CAD, bypass 1997, NSTEMI 02- 2017, angio with stent placement 10-2015 EXAM: PORTABLE CHEST 1 VIEW COMPARISON:  09/02/2016 FINDINGS: Stable changes from prior CABG surgery. Cardiac silhouette is normal in size. No mediastinal or hilar masses. No evidence of adenopathy. Clear lungs. No pleural effusion or pneumothorax. Skeletal structures are demineralized but grossly intact. IMPRESSION: No acute cardiopulmonary disease. Electronically Signed   By: Amie Portland M.D.   On: 06/26/2017 17:06    ASSESSMENT / PLAN: Unstable Angina Hypertensive Urgency Hyponatremia  Hx: Diabetes Mellitus, Chronic Stable Angina, Hypercholesteremia, and Seizures  P: Supplemental O2 for dyspnea and/or to maintain O2 sats >92% Cardiology consulted appreciate input  Trend troponin's Continuous telemetry monitoring  Continue heparin gtt dosing per pharmacy Trend CBC Monitor for s/sx of bleeding Transfuse for hgb <7 NS @75  ml/hr Continue oral cardiac medications Continue outpatient anticonvulsant medication Continue SSI, lantus, and glipizide   Sonda Rumble, AGNP  Pulmonary/Critical Care Pager [phone] (please enter 7 [phone]) PCCM Consult Pager [phone]  (please enter 7 digits)   PCCM ATTENDING ATTESTATION: I have evaluated patient with the APP [place], reviewed database in its entirety and discussed care plan in detail. I agree with the above findings, assessment and plan. She is now pain free. She required low dose phenylephrine for hypotension (after being hypertensive on admission). She has just been weaned off of pressors.    Important exam findings:  Gen: WDWN in NAD HEENT: NCAT, sclerae white, oropharynx normal Neck: NO LAN, no JVD noted Lungs: full BS, no adventitious sounds Cardiovascular: Reg, no M noted Abdomen: Soft, NT, +BS Ext: no C/C/E Neuro: PERRL, EOMI, motor/sensory grossly intact Skin: No lesions noted   Major problems addressed by PCCM team: USAP Hypertension, resolved Hypotension, resolved  PLAN/REC: Continue medical mgmt of known CAD DC heparin per Cards recs Will monitor off vasopressors for next few hours and if BP remains stable, she may be transferred to MedSurg   Billy Fischer, MD PCCM service Mobile [phone] Pager [phone] 06/27/2017 11:10 AM   ADD: Pt is off of all continuous infusions. She had one fleeting episode of CP after eating that was relieved by SL NTG.  Will transfer to Telemetry. After transfer, PCCM will sign off. Please call if we can be of further assistance    Billy Fischer, MD PCCM service Mobile [phone] Pager [phone] 06/27/2017 4:03 PM

## 2017-06-26 NOTE — Consult Note (Signed)
Cardiology Consultation:   Patient ID: Leah Small; [id number]; [birth year]   Admit date: 06/26/2017 Date of Consult: 06/26/2017  Primary Care Provider: Marguarite ArbourSparks, Jeffrey D, MD Primary Cardiologist: Dr Gwen PoundsKowalski   Patient Profile:   Leah Small is a 76 y.o. female with a hx of severe CAD, remote CABG who is being seen today for the evaluation of chest pain at the request of Dr Alphonzo LemmingsMcShane  History of Present Illness:   Leah Small has a history of extensive coronary artery disease.  She had remote bypass surgery greater than 20 years ago with chronic occlusion of all of her vein grafts conduits.  She has residual patency of the LIMA to LAD which collateralizes most of her heart.  She does have flow through the native left main into a stented ramus intermedius that was treated in March 2017 with a 2.25 x 14 mm resolute drug-eluting stent.  Other medical problems include type 2 diabetes, hypercholesterolemia, psoriatic arthritis, and complex partial seizures.  The patient has had long-standing severe angina.  She has been managed closely by Dr. Gwen PoundsKowalski with titration of her antianginal therapy as tolerated.  Today she was having a coughing spell and she developed severe chest discomfort typical of her angina in the center of her chest described as a pressure-like sensation, non-radiating.  EMS was called.  They initially paged her out as a code STEMI with ongoing chest pain and ST elevation isolated in aVR.  Her blood pressure was markedly elevated with a systolic blood pressure around 220 mmHg. Pain was slightly improved with sublingual NTG.   At the time of my evaluation here in the emergency room, the patient is more comfortable.  She continues to have chest pain but states that it is easing up.  She is on a nitroglycerin drip at 10 mics per minute.  She also complains of shortness of breath.  She has had lightheaded spells.  No edema, orthopnea, or PND.  She does state that her chest discomfort  is worse when she lies down on her back.  Past Medical History:  Diagnosis Date  . Acid reflux   . Arthritis    "all over"  . Chronic stable angina (HCC)   . Coronary artery disease   . Hypercholesterolemia   . Hypertension   . Myocardial infarction (HCC) 1991; 07/06/2015  . NSTEMI (non-ST elevated myocardial infarction) (HCC) 10/22/2015  . Psoriatic arthritis (HCC)   . 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 TIA (transient ischemic attack)    "several over the last 20 years" (10/22/2015)  . Type II diabetes mellitus (HCC)     Past Surgical History:  Procedure Laterality Date  . ABDOMINAL HYSTERECTOMY  1973  . APPENDECTOMY  1950s   elementary school  . CARDIAC CATHETERIZATION  X 2-3  . CARDIAC CATHETERIZATION N/A 11/21/2015   Procedure: Left Heart Cath and Coronary Angiography;  Surgeon: Corky CraftsJayadeep S Varanasi, MD;  [facility];  Service: Cardiovascular;  Laterality: N/A;  . CARDIAC CATHETERIZATION  11/21/2015   Procedure: Coronary Stent Intervention;  Surgeon: Corky CraftsJayadeep S Varanasi, MD;  [facility];  Service: Cardiovascular;;  . CARDIAC CATHETERIZATION N/A 01/29/2016   Procedure: Left Heart Cath and Cors/Grafts Angiography;  Surgeon: Tonny BollmanMichael Kyzer Blowe, MD;  [facility];  Service: Cardiovascular;  Laterality: N/A;  . CARDIAC CATHETERIZATION N/A 07/22/2016   Procedure: Left Heart Cath and Coronary Angiography;  Surgeon: Iran Ouch, MD;  Location: [facility] IN[facility] CV LAB;  Service: Cardiovascular;  Laterality: N/A;  . CAROTID STENT INSERTION Bilateral 2011-2012   right-left  . CATARACT EXTRACTION W/ INTRAOCULAR LENS  IMPLANT, BILATERAL Bilateral 2009-2010  . CHOLECYSTECTOMY OPEN  ~ 2014  . CORONARY ANGIOPLASTY    . CORONARY ANGIOPLASTY WITH STENT PLACEMENT  11/21/2015  . CORONARY ARTERY BYPASS GRAFT  08/1995  . DILATION AND CURETTAGE OF UTERUS  X 1  . THROMBECTOMY FEMORAL ARTERY  Right ~ 2015   right femoral artery occlusion/notes 12/13/2014  . TONSILLECTOMY  ~ 1947   2nd grade     Home Medications:  Prior to Admission medications   Medication Sig Start Date End Date Taking? Authorizing Provider  Adalimumab (HUMIRA PEN [place]) Inject 1 application into the skin See admin instructions. Patient takes it twice monthly.The next one is due June 26th    [provider]  aspirin EC 81 MG tablet Take 81 mg by mouth at bedtime.    [provider]  atorvastatin (LIPITOR) 80 MG tablet Take 1 tablet (80 mg total) by mouth at bedtime. Patient taking differently: Take 40 mg by mouth at bedtime.  11/22/15   Rai, Ripudeep K, MD  brimonidine (ALPHAGAN) 0.2 % ophthalmic solution Place 1 drop into both eyes 2 (two) times daily.     [provider]  clopidogrel (PLAVIX) 75 MG tablet Take 75 mg by mouth every morning.     [provider]  gabapentin (NEURONTIN) 100 MG capsule Take 100-200 mg by mouth at bedtime as needed (for neuropathy).  03/26/16   [provider]  glipiZIDE (GLUCOTROL XL) 10 MG 24 hr tablet Take 10 mg by mouth every morning. 04/26/15   [provider]  Insulin Glargine (LANTUS SOLOSTAR) 100 UNIT/ML Solostar Pen Inject 24 Units into the skin at bedtime.    [provider]  insulin lispro (HUMALOG) 100 UNIT/ML injection Inject 6-8 Units into the skin 3 (three) times daily before meals. Per sliding scale: 6 units plus:   CBG 150-200 add 1 unit, 201-250 add 2 units, 251-300 add 3 units, 301-350 add 4 units 351-400 add 5 units, >400 add 6 units and call MD    [provider]  isosorbide mononitrate (IMDUR) 60 MG 24 hr tablet Take 30 mg by mouth 2 (two) times daily.  03/21/16   [provider]  levETIRAcetam (KEPPRA) 750 MG tablet Take 750 mg by mouth 2 (two) times daily.    [provider]  metFORMIN (GLUCOPHAGE) 1000 MG tablet Take 1 tablet (1,000 mg total) by mouth 2 (two) times daily. 01/30/16    Ok Anis, NP  metoprolol tartrate (LOPRESSOR) 25 MG tablet Take 1 tablet (25 mg total) by mouth at bedtime. 07/23/16   Little Ishikawa, NP  nitroGLYCERIN (NITRODUR - DOSED IN MG/24 HR) 0.4 mg/hr patch Place 0.4 mg onto the skin daily as needed (for chest pain).  02/19/16   [provider]  nitroGLYCERIN (NITROSTAT) 0.4 MG SL tablet Place 0.4 mg under the tongue every 5 (five) minutes as needed for chest pain.    [provider]  nystatin cream (MYCOSTATIN) Apply 1 application topically 2 (two) times daily as needed (yeast infection).  04/25/15   [provider]  ranitidine (ZANTAC) 150 MG tablet Take 150 mg by mouth every morning.     [provider]  triamcinolone ointment (KENALOG) 0.1 % Apply 1 application topically 3 (three) times daily as needed (itching from  psoriasis).    [provider]  vitamin B-12 (CYANOCOBALAMIN) 1000 MCG tablet Take 1,000 mcg by mouth every morning.     [provider]  vitamin E 400 UNIT capsule Take 400 Units by mouth every morning.     [provider]    Inpatient Medications: Scheduled Meds: . heparin      . metoprolol tartrate  5 mg Intravenous Once   Continuous Infusions: . nitroGLYCERIN 20 mcg/min (06/26/17 1650)   PRN Meds: nitroGLYCERIN  Allergies:    Allergies  Allergen Reactions  . Talwin [Pentazocine] Anaphylaxis and Swelling    Throat swelling  . Valium [Diazepam] Other (See Comments)    Makes her feel like she's flying  . Vicodin [Hydrocodone-Acetaminophen] Nausea And Vomiting  . Adhesive [Tape] Other (See Comments)    Bruising and TEARING!!!  Please use "paper" tape  . Beta Adrenergic Blockers Other (See Comments)  . Levofloxacin Other (See Comments)    Unknown   . Simvastatin Swelling and Rash    Mouth swelling    Social History:   Social History   Social History  . Marital status: Widowed    Spouse name: N/A  . Number of children: N/A  . Years of  education: N/A   Occupational History  . Not on file.   Social History Main Topics  . Smoking status: Never Smoker  . Smokeless tobacco: Never Used  . Alcohol use No  . Drug use: No  . Sexual activity: No   Other Topics Concern  . Not on file   Social History Narrative  . No narrative on file    Family History:    Family History  Problem Relation Age of Onset  . Heart attack Father   . Diabetes Mother   . Cancer Mother   . Stroke Mother   . Breast cancer Cousin      ROS:  Please see the history of present illness.  ROS  All other ROS reviewed and negative.     Physical Exam/Data:   Vitals:   06/26/17 1621 06/26/17 1639 06/26/17 1641  BP:  (!) 166/62 (!) 183/84  Pulse: 75 73   Resp: 16 20 (!) 22  Temp: 97.8 F (36.6 C)    TempSrc: Oral    SpO2:  99% 100%  Weight: 143 lb (64.9 kg)    Height: 5\' 3"  (1.6 m)     No intake or output data in the 24 hours ending 06/26/17 1705 Filed Weights   06/26/17 1621  Weight: 143 lb (64.9 kg)   Body mass index is 25.33 kg/m.  General:  Pleasant elderly woman, in no acute distress HEENT: normal Lymph: no adenopathy Neck: no JVD Endocrine:  No thryomegaly Vascular: No carotid bruits Chest: chest wall tenderness to palpation Cardiac:  normal S1, S2; RRR; no murmur, positive S4 gallop Lungs:  clear to auscultation bilaterally, no wheezing, rhonchi or rales  Abd: soft, nontender, no hepatomegaly  Ext: no edema Musculoskeletal:  No deformities, BUE and BLE strength normal and equal Skin: warm and dry  Neuro:  CNs 2-12 intact, no focal abnormalities noted Psych:  Normal affect   EKG:  The EKG was personally reviewed and demonstrates: Normal sinus rhythm, ST and T wave abnormality consider lateral ischemia.  ST segment depression a little more pronounced than her baseline tracing from February 07, 2017 but no marked changes noted.  Telemetry:  Telemetry was personally reviewed and demonstrates:  Sinus rhythm  Relevant CV  Studies: Cardiac catheterization  July 22, 2016: 1. Significant underlying three-vessel coronary artery disease with known chronically occluded vein grafts. Patent LIMA to LAD and patent ramus stent. No significant change in coronary anatomy since most recent cardiac catheterization in June. The left circumflex and RCA territories are supplied by collaterals from the LIMA and ramus. 2. Moderately reduced LV systolic function with severe inferior wall hypokinesis and moderately elevated left ventricular end-diastolic pressure.  Recommendations: Continue aggressive medical therapy. The patient was noted to have short runs of atrial tachycardia while in the cath lab table. I increased the dose of metoprolol to 25 mg twice daily. Continue antianginal therapy and consider resuming Ranexa if the patient can afford.  Laboratory Data:  Chemistry Recent Labs Lab 06/26/17 1608  NA 129*  K 4.6  CL 98*  CO2 21*  GLUCOSE 220*  BUN 15  CREATININE 0.82  CALCIUM 9.1  GFRNONAA >60  GFRAA >60  ANIONGAP 10     Recent Labs Lab 06/26/17 1608  PROT 8.5*  ALBUMIN 4.6  AST 32  ALT 17  ALKPHOS 52  BILITOT 1.0   Hematology Recent Labs Lab 06/26/17 1608  WBC 5.1  RBC 3.89  HGB 10.8*  HCT 32.7*  MCV 84.1  MCH 27.7  MCHC 32.9  RDW 17.4*  PLT 217   Cardiac Enzymes Recent Labs Lab 06/26/17 1608  TROPONINI <0.03   No results for input(s): TROPIPOC in the last 168 hours.  BNPNo results for input(s): BNP, PROBNP in the last 168 hours.  DDimer No results for input(s): DDIMER in the last 168 hours.  Radiology/Studies:  No results found.  Assessment and Plan:   73. 76 year old woman with extensive coronary artery disease, remote CABG, known occlusion of all vein grafts, presenting with symptoms of unstable angina after a coughing spell today.  Her EKG is compared to previous tracings with significant ST depression laterally but no marked change from baseline.  Her EKG is not diagnostic  of STEMI.  I suspect we have very little to offer her from a percutaneous standpoint considering known occlusion of all of her bypasses other than the mammary graft which would be expected to demonstrate continued patency.  The patient has markedly elevated blood pressure and recommend working with IV nitroglycerin and beta-blockade to lower her blood pressure.  Will start IV heparin as well.  She should be continued on dual antiplatelet therapy with aspirin and clopidogrel.  We will cycle enzymes.  We will ask the hospitalist service to admit her and cardiology will follow her closely.  2.  Hypertensive urgency: As above, will treat with IV nitroglycerin and intravenous metoprolol.  Titrate antihypertensive medicines to achieve a systolic blood pressure less than 160 mmHg or chest pain-free.  3.  Type 2 diabetes: Insulin coverage per hospitalist service  4. Hyperlipidemia: continue atorvastatin 80 mg daily  Plan for medical management discussed with patient and her son who is at the bedside.   For questions or updates, please contact [facility] Please consult [email or web address] for contact info under Cardiology/STEMI.   Enzo Bi, MD  06/26/2017 5:05 PM

## 2017-06-27 ENCOUNTER — Other Ambulatory Visit: Payer: Self-pay

## 2017-06-27 DIAGNOSIS — I200 Unstable angina: Secondary | ICD-10-CM

## 2017-06-27 DIAGNOSIS — I959 Hypotension, unspecified: Secondary | ICD-10-CM

## 2017-06-27 DIAGNOSIS — I10 Essential (primary) hypertension: Secondary | ICD-10-CM

## 2017-06-27 LAB — COMPREHENSIVE METABOLIC PANEL
ALBUMIN: 3.4 g/dL — AB (ref 3.5–5.0)
ALT: 13 U/L — ABNORMAL LOW (ref 14–54)
ANION GAP: 6 (ref 5–15)
AST: 22 U/L (ref 15–41)
Alkaline Phosphatase: 45 U/L (ref 38–126)
BUN: 9 mg/dL (ref 6–20)
CALCIUM: 8.1 mg/dL — AB (ref 8.9–10.3)
CHLORIDE: 107 mmol/L (ref 101–111)
CO2: 24 mmol/L (ref 22–32)
Creatinine, Ser: 0.65 mg/dL (ref 0.44–1.00)
GFR calc non Af Amer: >60 mL/min (ref >60)
Glucose, Bld: 80 mg/dL (ref 65–99)
POTASSIUM: 4.2 mmol/L (ref 3.5–5.1)
SODIUM: 137 mmol/L (ref 135–145)
Total Bilirubin: 0.6 mg/dL (ref 0.3–1.2)
Total Protein: 6.5 g/dL (ref 6.5–8.1)

## 2017-06-27 LAB — ECHOCARDIOGRAM COMPLETE
CHL CUP MV DEC (S): 155
E/e' ratio: 39.3
EWDT: 155 ms
FS: 28 % (ref 28–44)
HEIGHTINCHES: 63 in
IV/PV OW: 1.1
LA ID, A-P, ES: 43 mm
LA diam index: 2.51 cm/m2
LA vol: 75.5 mL
LAVOLA4C: 74.9 mL
LAVOLIN: 44.1 mL/m2
LDCA: 2.84 cm2
LEFT ATRIUM END SYS DIAM: 43 mm
LV E/e'average: 39.3
LV PW d: 8.41 mm — AB (ref 0.6–1.1)
LV TDI E'MEDIAL: 3.41
LVEEMED: 39.3
LVOTD: 19 mm
MV pk A vel: 38.6 m/s
MV pk E vel: 134 m/s
MVAP: 4.89 cm2
MVPG: 7 mmHg
P 1/2 time: 45 ms
RV LATERAL S' VELOCITY: 9.24 cm/s
RV TAPSE: 18.1 mm
Weight: 2288 oz

## 2017-06-27 LAB — CBC
HEMATOCRIT: 28.6 % — AB (ref 35.0–47.0)
HEMOGLOBIN: 8.9 g/dL — AB (ref 12.0–16.0)
MCH: 26.2 pg (ref 26.0–34.0)
MCHC: 31.1 g/dL — AB (ref 32.0–36.0)
MCV: 84.3 fL (ref 80.0–100.0)
Platelets: 170 10*3/uL (ref 150–440)
RBC: 3.4 MIL/uL — AB (ref 3.80–5.20)
RDW: 17.7 % — ABNORMAL HIGH (ref 11.5–14.5)
WBC: 3.7 10*3/uL (ref 3.6–11.0)

## 2017-06-27 LAB — GLUCOSE, CAPILLARY
GLUCOSE-CAPILLARY: 213 mg/dL — AB (ref 65–99)
GLUCOSE-CAPILLARY: 206 mg/dL — AB (ref 65–99)
Glucose-Capillary: 76 mg/dL (ref 65–99)
Glucose-Capillary: 187 mg/dL — ABNORMAL HIGH (ref 65–99)

## 2017-06-27 LAB — HEPARIN LEVEL (UNFRACTIONATED)
HEPARIN UNFRACTIONATED: 0.69 [IU]/mL (ref 0.30–0.70)
Heparin Unfractionated: 0.48 IU/mL (ref 0.30–0.70)

## 2017-06-27 LAB — TROPONIN I: Troponin I: <0.03 ng/mL (ref <0.03)

## 2017-06-27 MED ORDER — FAMOTIDINE 20 MG PO TABS
20.0000 mg | ORAL_TABLET | Freq: Every day | ORAL | Status: DC
  Administered 2017-06-27 – 2017-06-29 (×3): 20 mg via ORAL
  Filled 2017-06-27 (×3): qty 1

## 2017-06-27 NOTE — Progress Notes (Signed)
Pt reports chest discomfort 3/10 at this time.  States "it's going away."

## 2017-06-27 NOTE — Progress Notes (Signed)
[employer] - [place] at [facility]                                                                                                                                                                                  Patient Demographics   Leah FrederickDonnie Small, is a 76 y.o. female, DOB - [birth year], [id number]  Admit date - 06/26/2017   Admitting Physician Marguarite ArbourJeffrey D Sparks, MD  Outpatient Primary MD for the patient is Marguarite ArbourSparks, Jeffrey D, MD   LOS - 1  Subjective: Patient admitted with chest pain and elevated blood pressure overnight patient's blood pressure was low had to be started on neo-synephrine She complains of cough and congestion chest pain is improved off nitroglycerin drip   Review of Systems:   CONSTITUTIONAL: No documented fever. No fatigue, weakness. No weight gain, no weight loss.  EYES: No blurry or double vision.  ENT: No tinnitus. No postnasal drip. No redness of the oropharynx.  RESPIRATORY: Positive cough, no wheeze, no hemoptysis.  Positive dyspnea.  CARDIOVASCULAR: No chest pain. No orthopnea. No palpitations. No syncope.  GASTROINTESTINAL: No nausea, no vomiting or diarrhea. No abdominal pain. No melena or hematochezia.  GENITOURINARY: No dysuria or hematuria.  ENDOCRINE: No polyuria or nocturia. No heat or cold intolerance.  HEMATOLOGY: No anemia. No bruising. No bleeding.  INTEGUMENTARY: No rashes. No lesions.  MUSCULOSKELETAL: No arthritis. No swelling. No gout.  NEUROLOGIC: No numbness, tingling, or ataxia. No seizure-type activity.  PSYCHIATRIC: No anxiety. No insomnia. No ADD.    Vitals:   Vitals:   06/27/17 0600 06/27/17 0800 06/27/17 0900 06/27/17 1000  BP: (!) 93/40 (!) 114/44 93/81 (!) 138/44  Pulse: (!) 50 (!) 50 (!) 55 (!) 54  Resp: 12 14 14  (!) 21  Temp:      TempSrc:      SpO2: 94% 97% 99% 100%  Weight:      Height:        Wt Readings from Last 3 Encounters:  06/27/17 142 lb 13.7 oz (64.8 kg)  02/07/17 145 lb (65.8 kg)   11/09/16 142 lb (64.4 kg)     Intake/Output Summary (Last 24 hours) at 06/27/2017 1416 Last data filed at 06/27/2017 1150 Gross per 24 hour  Intake 974.05 ml  Output 1200 ml  Net -225.95 ml    Physical Exam:   GENERAL: Pleasant-appearing in no apparent distress.  HEAD, EYES, EARS, NOSE AND THROAT: Atraumatic, normocephalic. Extraocular muscles are intact. Pupils equal and reactive to light. Sclerae anicteric. No conjunctival injection. No oro-pharyngeal erythema.  NECK: Supple. There is no jugular venous distention. No bruits, no lymphadenopathy, no thyromegaly.  HEART: Regular rate and rhythm,. No murmurs, no  rubs, no clicks.  LUNGS: Clear to auscultation bilaterally. No rales or rhonchi. No wheezes.  ABDOMEN: Soft, flat, nontender, nondistended. Has good bowel sounds. No hepatosplenomegaly appreciated.  EXTREMITIES: No evidence of any cyanosis, clubbing, or peripheral edema.  +2 pedal and radial pulses bilaterally.  NEUROLOGIC: The patient is alert, awake, and oriented x3 with no focal motor or sensory deficits appreciated bilaterally.  SKIN: Moist and warm with no rashes appreciated.  Psych: Not anxious, depressed LN: No inguinal LN enlargement    Antibiotics   Anti-infectives (From admission, onward)   None      Medications   Scheduled Meds: . aspirin EC  81 mg Oral Q2000  . atorvastatin  40 mg Oral Q2000  . brimonidine  1 drop Both Eyes BID  . clopidogrel  75 mg Oral BH-[id number]  . docusate sodium  100 mg Oral BID  . famotidine  20 mg Oral Daily  . glipiZIDE  10 mg Oral q morning - 10a  . insulin aspart  0-9 Units Subcutaneous TID WC  . insulin glargine  24 Units Subcutaneous Q2000  . lamoTRIgine  50 mg Oral BID  . levETIRAcetam  750 mg Oral BID  . timolol  1 drop Both Eyes BID   Continuous Infusions: . phenylephrine (NEO-SYNEPHRINE) Adult infusion Stopped (06/27/17 1043)   PRN Meds:.acetaminophen **OR** [DISCONTINUED] acetaminophen, bisacodyl, nitroGLYCERIN,  [DISCONTINUED] ondansetron **OR** ondansetron (ZOFRAN) IV   Data Review:   Micro Results Recent Results (from the past 240 hour(s))  MRSA PCR Screening     Status: None   Collection Time: 06/26/17  7:14 PM  Result Value Ref Range Status   MRSA by PCR NEGATIVE NEGATIVE Final    Comment:        The GeneXpert MRSA Assay (FDA approved for NASAL specimens only), is one component of a comprehensive MRSA colonization surveillance program. It is not intended to diagnose MRSA infection nor to guide or monitor treatment for MRSA infections.     Radiology Reports Dg Chest Portable 1 View  Result Date: 06/26/2017 CLINICAL DATA:  chest pain, non smoker. Hx of CAD, bypass 1997, NSTEMI 02- 2017, angio with stent placement 10-2015 EXAM: PORTABLE CHEST 1 VIEW COMPARISON:  09/02/2016 FINDINGS: Stable changes from prior CABG surgery. Cardiac silhouette is normal in size. No mediastinal or hilar masses. No evidence of adenopathy. Clear lungs. No pleural effusion or pneumothorax. Skeletal structures are demineralized but grossly intact. IMPRESSION: No acute cardiopulmonary disease. Electronically Signed   By: Amie Portland M.D.   On: 06/26/2017 17:06     CBC Recent Labs  Lab 06/26/17 1608 06/27/17 0732  WBC 5.1 3.7  HGB 10.8* 8.9*  HCT 32.7* 28.6*  PLT 217 170  MCV 84.1 84.3  MCH 27.7 26.2  MCHC 32.9 31.1*  RDW 17.4* 17.7*  LYMPHSABS 2.0  --   MONOABS 0.8  --   EOSABS 0.2  --   BASOSABS 0.0  --     Chemistries  Recent Labs  Lab 06/26/17 1608 06/27/17 0732  NA 129* 137  K 4.6 4.2  CL 98* 107  CO2 21* 24  GLUCOSE 220* 80  BUN 15 9  CREATININE 0.82 0.65  CALCIUM 9.1 8.1*  AST 32 22  ALT 17 13*  ALKPHOS 52 45  BILITOT 1.0 0.6   ------------------------------------------------------------------------------------------------------------------ estimated creatinine clearance is 53.4 mL/min (by C-G formula based on SCr of 0.65  mg/dL). ------------------------------------------------------------------------------------------------------------------ No results for input(s): HGBA1C in the last 72 hours. ------------------------------------------------------------------------------------------------------------------ Recent Labs    06/26/17  1608  CHOL 158  HDL 48  LDLCALC 47  TRIG 316*  CHOLHDL 3.3   ------------------------------------------------------------------------------------------------------------------ No results for input(s): TSH, T4TOTAL, T3FREE, THYROIDAB in the last 72 hours.  Invalid input(s): FREET3 ------------------------------------------------------------------------------------------------------------------ No results for input(s): VITAMINB12, FOLATE, FERRITIN, TIBC, IRON, RETICCTPCT in the last 72 hours.  Coagulation profile Recent Labs  Lab 06/26/17 1608  INR 1.06    No results for input(s): DDIMER in the last 72 hours.  Cardiac Enzymes Recent Labs  Lab 06/26/17 1927 06/27/17 0120 06/27/17 0732  TROPONINI <0.03 <0.03 <0.03   ------------------------------------------------------------------------------------------------------------------ Invalid input(s): POCBNP    Assessment & Plan  Patient is a 76 year old admitted with chest pain with known history of coronary artery disease     1. Unstable angina pectoris [facility]) patient seen by cardiology medical management recommended, patient nitroglycerin has been discontinued, we can add Ranexa on discharge cardiology plans no intervention at this point Continue aspirin Plavix 2.  Accelerated hypertension present on admission nitroglycerin has been discontinued pt bp was low last night  Now improved, can resume home meds per ICU md  3.  Cough likely due to acute bronchitis consider starting patient on azithromycin  4.   Diabetes mellitus with complication (HCC) continue sliding scale insulin, glipizide and Lantus   5.   Hyperlipidemia unspecified continue Lipitor      Code Status Orders  (From admission, onward)        Start     Ordered   06/26/17 1921  Do not attempt resuscitation (DNR)  Continuous    Question Answer Comment  In the event of cardiac or respiratory ARREST Do not call a "code blue"   In the event of cardiac or respiratory ARREST Do not perform Intubation, CPR, defibrillation or ACLS   In the event of cardiac or respiratory ARREST Use medication by any route, position, wound care, and other measures to relive pain and suffering. May use oxygen, suction and manual treatment of airway obstruction as needed for comfort.      06/26/17 1920    Code Status History    Date Active Date Inactive Code Status Order ID Comments User Context   07/21/2016 22:50 07/23/2016 16:05 DNR [id number]  Thurmon Fair, MD Inpatient   01/29/2016 00:31 01/30/2016 18:09 DNR [id number]  Quintella Reichert, MD Inpatient   11/20/2015 22:07 11/22/2015 15:25 DNR [id number]  Briscoe Deutscher, MD ED   10/22/2015 18:23 10/24/2015 14:14 DNR [id number]  Eddie North, MD Inpatient   07/06/2015 18:04 07/10/2015 16:56 Full Code [id number]  Ozella Rocks, MD ED    Advance Directive Documentation     Most Recent Value  Type of Advance Directive  Out of facility DNR (pink MOST or yellow form)  Pre-existing out of facility DNR order (yellow form or pink MOST form)  Yellow form placed in chart (order not valid for inpatient use)  "MOST" Form in Place?  No data           Consults cardiology intensivist  DVT Prophylaxis heparin  Lab Results  Component Value Date   PLT 170 06/27/2017     Time Spent in minutes   35 minutes greater than 50% of time spent in care coordination and counseling patient regarding the condition and plan of care.   Auburn Bilberry M.D on 06/27/2017 at 2:16 PM  Between 7am to 6pm - Pager - [phone]  After 6pm go to [email or web address] - password [email or web address] [facility]  [facility]   Office   [phone]

## 2017-06-27 NOTE — Consult Note (Signed)
[facility] Cardiology Consultation Note  Patient ID: Leah FishmanDonnie R [place], MRN: [id number], DOB/AGE: 10/31/1940 76 y.o. Admit date: 06/26/2017   Date of Consult: 06/27/2017 Primary Physician: Marguarite ArbourSparks, Jeffrey D, MD Primary Cardiologist: Gwen PoundsKowalski  Chief Complaint:  Chief Complaint  Patient presents with  . Chest Pain   Reason for Consult: chest pain  HPI: 76 y.o. female with known coronary artery disease status post previous coronary artery bypass graft and multiple arterial changes in the past for which the patient has received an intervention of the left and returned to mammary artery and maximized her medications for her small vessel coronary artery disease not amenable to further intervention at last catheter. She has been on dual antiplatelet therapy high intensity cholesterol therapy and medication management including nitrates. The patient has had multiple episodes of chest discomfort for which she is culminated in this severe chest discomfort substernal in nature radiating into her right jaw lasting longer than she wishes and having concerns of unstable angina versus myocardial infarction. Nitroglycerin finally did improve the symptoms and the patient is pain-free at this time. EKG shows normal sinus rhythm and echocardiogram shows normal LV systolic function with mild to moderate mitral and tricuspid regurgitation and no evidence of elevated troponin or myocardial infarction at this time. We have discussed at length further intervention which at this point would not be ideal due to previous diffuse three-vessel disease not of significantly amenable to further intervention. We also have a discussed reinstatement of Ranexa which has helped in the past  Past Medical History:  Diagnosis Date  . Acid reflux   . Arthritis    "all over"  . Chronic stable angina (HCC)   . Coronary artery disease   . Hypercholesterolemia   . Hypertension   . Myocardial infarction (HCC) 1991; 07/06/2015  .  NSTEMI (non-ST elevated myocardial infarction) (HCC) 10/22/2015  . Psoriatic arthritis (HCC)   . 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 TIA (transient ischemic attack)    "several over the last 20 years" (10/22/2015)  . Type II diabetes mellitus (HCC)       Surgical History:  Past Surgical History:  Procedure Laterality Date  . ABDOMINAL HYSTERECTOMY  1973  . APPENDECTOMY  1950s   elementary school  . CARDIAC CATHETERIZATION  X 2-3  . CAROTID STENT INSERTION Bilateral 2011-2012   right-left  . CATARACT EXTRACTION W/ INTRAOCULAR LENS  IMPLANT, BILATERAL Bilateral 2009-2010  . CHOLECYSTECTOMY OPEN  ~ 2014  . CORONARY ANGIOPLASTY    . CORONARY ANGIOPLASTY WITH STENT PLACEMENT  11/21/2015  . CORONARY ARTERY BYPASS GRAFT  08/1995  . DILATION AND CURETTAGE OF UTERUS  X 1  . 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 TONSILLECTOMY  ~ 1947   2nd grade     Home Meds: Prior to Admission medications   Medication Sig Start Date End Date Taking? Authorizing Provider  Adalimumab (HUMIRA PEN [place]) Inject 1 application into the skin See admin instructions. Patient takes it twice monthly.The next one is due June 26th   Yes [provider]  aspirin EC 81 MG tablet Take 81 mg by mouth at bedtime.   Yes [provider]  atorvastatin (LIPITOR) 80 MG tablet Take 1 tablet (80 mg total) by mouth at bedtime. Patient taking differently: Take 40 mg by mouth at bedtime.  11/22/15  Yes Rai, Delene Ruffiniipudeep K, MD  brimonidine [facility](ALPHAGAN)  0.2 % ophthalmic solution Place 1 drop into both eyes 2 (two) times daily.    Yes [provider]  clopidogrel (PLAVIX) 75 MG tablet Take 75 mg by mouth every morning.    Yes [provider]  glipiZIDE (GLUCOTROL XL) 10 MG 24 hr tablet Take 10 mg by mouth every morning. 04/26/15  Yes [provider]  Insulin Glargine (LANTUS SOLOSTAR)  100 UNIT/ML Solostar Pen Inject 24 Units into the skin at bedtime.   Yes [provider]  insulin lispro (HUMALOG) 100 UNIT/ML injection Inject 6-8 Units into the skin 3 (three) times daily before meals. Per sliding scale: 6 units plus:   CBG 150-200 add 1 unit, 201-250 add 2 units, 251-300 add 3 units, 301-350 add 4 units 351-400 add 5 units, >400 add 6 units and call MD   Yes [provider]  isosorbide mononitrate (IMDUR) 60 MG 24 hr tablet Take 60 mg by mouth 2 (two) times daily.  03/21/16  Yes [provider]  lamoTRIgine (LAMICTAL) 25 MG tablet Take 50 mg by mouth 2 (two) times daily. 06/14/17  Yes [provider]  levETIRAcetam (KEPPRA) 750 MG tablet Take 750 mg by mouth 2 (two) times daily.   Yes [provider]  metFORMIN (GLUCOPHAGE) 1000 MG tablet Take 1 tablet (1,000 mg total) by mouth 2 (two) times daily. 01/30/16  Yes Ok Anis, NP  metoprolol tartrate (LOPRESSOR) 25 MG tablet Take 1 tablet (25 mg total) by mouth at bedtime. 07/23/16  Yes Little Ishikawa, NP  ranitidine (ZANTAC) 150 MG tablet Take 150 mg by mouth every morning.    Yes [provider]  timolol (TIMOPTIC) 0.5 % ophthalmic solution Place 1 drop into both eyes 2 (two) times daily. 06/14/17  Yes [provider]  triamcinolone ointment (KENALOG) 0.1 % Apply 1 application topically 3 (three) times daily as needed (itching from psoriasis).   Yes [provider]  vitamin B-12 (CYANOCOBALAMIN) 1000 MCG tablet Take 1,000 mcg by mouth every morning.    Yes [provider]  vitamin E 400 UNIT capsule Take 400 Units by mouth every morning.    Yes [provider]  nitroGLYCERIN (NITRODUR - DOSED IN MG/24 HR) 0.4 mg/hr patch Place 0.4 mg onto the skin daily as needed (for chest pain).  02/19/16   [provider]  nitroGLYCERIN (NITROSTAT) 0.4 MG SL tablet Place 0.4 mg under the tongue every 5 (five) minutes as needed for chest pain.     [provider]  nystatin cream (MYCOSTATIN) Apply 1 application topically 2 (two) times daily as needed (yeast infection).  04/25/15   [provider]    Inpatient Medications:  . aspirin EC  81 mg Oral Q2000  . atorvastatin  40 mg Oral Q2000  . brimonidine  1 drop Both Eyes BID  . clopidogrel  75 mg Oral [id number]  . docusate sodium  100 mg Oral BID  . glipiZIDE  10 mg Oral q morning - 10a  . insulin aspart  0-9 Units Subcutaneous TID WC  . insulin glargine  24 Units Subcutaneous Q2000  . lamoTRIgine  50 mg Oral BID  . levETIRAcetam  750 mg Oral BID  . pantoprazole (PROTONIX) IV  40 mg Intravenous BID  . timolol  1 drop Both Eyes BID   . sodium chloride 75 mL/hr at 06/27/17 0500  . heparin 12 Units/kg/hr (06/27/17 0500)  . phenylephrine (NEO-SYNEPHRINE) Adult infusion 3 mcg/min (06/27/17 0622)    Allergies:  Allergies  Allergen Reactions  . Talwin [Pentazocine] Anaphylaxis and Swelling    Throat swelling  . Valium [Diazepam] Other (See Comments)    Makes her feel like she's flying  . Vicodin [Hydrocodone-Acetaminophen] Nausea And Vomiting  . Adhesive [Tape] Other (See Comments)    Bruising and TEARING!!!  Please use "paper" tape  . Beta Adrenergic Blockers Other (See Comments)    Pt states she not allergic  . Levofloxacin Swelling and Other (See Comments)    Tongue swells   . Simvastatin Swelling and Rash    Mouth swelling    Social History   Socioeconomic History  . Marital status: Widowed    Spouse name: Not on file  . Number of children: Not on file  . Years of education: Not on file  . Highest education level: Not on file  Social Needs  . Financial resource strain: Not on file  . Food insecurity - worry: Not on file  . Food insecurity - inability: Not on file  . Transportation needs - medical: Not on file  . Transportation needs - non-medical: Not on file  Occupational History  . Not on file  Tobacco Use  . Smoking status: Never Smoker  .  Smokeless tobacco: Never Used  Substance and Sexual Activity  . Alcohol use: No  . Drug use: No  . Sexual activity: No  Other Topics Concern  . Not on file  Social History Narrative  . Not on file     Family History  Problem Relation Age of Onset  . Heart attack Father   . Diabetes Mother   . Cancer Mother   . Stroke Mother   . Breast cancer Cousin      Review of Systems Positive for chest pain Negative for: General:  chills, fever, night sweats or weight changes.  Cardiovascular: PND orthopnea syncope dizziness  Dermatological skin lesions rashes Respiratory: Cough congestion Urologic: Frequent urination urination at night and hematuria Abdominal: negative for nausea, vomiting, diarrhea, bright red blood per rectum, melena, or hematemesis Neurologic: negative for visual changes, and/or hearing changes  All other systems reviewed and are otherwise negative except as noted above.  Labs: Recent Labs    06/26/17 1608 06/26/17 1927 06/27/17 0120 06/27/17 0732  TROPONINI <0.03 <0.03 <0.03 <0.03   Lab Results  Component Value Date   WBC 3.7 06/27/2017   HGB 8.9 (L) 06/27/2017   HCT 28.6 (L) 06/27/2017   MCV 84.3 06/27/2017   PLT 170 06/27/2017    Recent Labs  Lab 06/27/17 0732  NA 137  K 4.2  CL 107  CO2 24  BUN 9  CREATININE 0.65  CALCIUM 8.1*  PROT 6.5  BILITOT 0.6  ALKPHOS 45  ALT 13*  AST 22  GLUCOSE 80   Lab Results  Component Value Date   CHOL 158 06/26/2017   HDL 48 06/26/2017   LDLCALC 47 06/26/2017   TRIG 316 (H) 06/26/2017   No results found for: DDIMER  Radiology/Studies:  Dg Chest Portable 1 View  Result Date: 06/26/2017 CLINICAL DATA:  chest pain, non smoker. Hx of CAD, bypass 1997, NSTEMI 02- 2017, angio with stent placement 10-2015 EXAM: PORTABLE CHEST 1 VIEW COMPARISON:  09/02/2016 FINDINGS: Stable changes from prior CABG surgery. Cardiac silhouette is normal in size. No mediastinal or hilar masses. No evidence of adenopathy.  Clear lungs. No pleural effusion or pneumothorax. Skeletal structures are demineralized but grossly intact. IMPRESSION: No acute cardiopulmonary disease. Electronically Signed   By: Onalee Hua  Ormond M.D.   On: 06/26/2017 17:06    EKG: Normal sinus rhythm  Weights: Filed Weights   06/26/17 1621 06/27/17 0428  Weight: 64.9 kg (143 lb) 64.8 kg (142 lb 13.7 oz)     Physical Exam: Blood pressure (!) 93/40, pulse (!) 50, temperature 98.2 F (36.8 C), temperature source Oral, resp. rate 12, height 5\' 3"  (1.6 m), weight 64.8 kg (142 lb 13.7 oz), SpO2 94 %. Body mass index is 25.31 kg/m. General: Well developed, well nourished, in no acute distress. Head eyes ears nose throat: Normocephalic, atraumatic, sclera non-icteric, no xanthomas, nares are without discharge. No apparent thyromegaly and/or mass  Lungs: Normal respiratory effort.  no wheezes, no rales, no rhonchi.  Heart: RRR with normal S1 S2. no murmur gallop, no rub, PMI is normal size and placement, carotid upstroke normal without bruit, jugular venous pressure is normal Abdomen: Soft, non-tender, non-distended with normoactive bowel sounds. No hepatomegaly. No rebound/guarding. No obvious abdominal masses. Abdominal aorta is normal size without bruit Extremities: No edema. no cyanosis, no clubbing, no ulcers  Peripheral : 2+ bilateral upper extremity pulses, 2+ bilateral femoral pulses, 2+ bilateral dorsal pedal pulse Neuro: Alert and oriented. No facial asymmetry. No focal deficit. Moves all extremities spontaneously. Musculoskeletal: Normal muscle tone without kyphosis Psych:  Responds to questions appropriately with a normal affect.    Assessment: 76 year old female with diffuse three-vessel disease status post previous interventions in the past or currently with severe substernal chest discomfort worse than before although without evidence of myocardial infarction or heart failure  Plan: 1. Okay for discontinuation of heparin due to  no evidence of myocardial infarction 2. Continue dual antiplatelet therapy for further risk reduction cardiovascular event 3. High intensity cholesterol therapy without change 4. Nitrate 60 mg isosorbide in the a.m. and consideration of evening dose if necessary 5. Addition of Ranexa 500 mg twice per day for further treatment of chest discomfort 6. No further cardiac diagnostics necessary at this time  Signed, Lamar Blinks M.D. [facility] Cardiology 06/27/2017, 9:13 AM

## 2017-06-27 NOTE — Progress Notes (Signed)
Dr. Sung AmabileSimonds aware of seizure like activity.  No further orders at this time.

## 2017-06-27 NOTE — Progress Notes (Signed)
Pt sitting up on side of bed eating.  Began to cough, and is no reporting left arm pain, and chest tightness.  1 SL NTG given. She reports tightness  8/10.

## 2017-06-27 NOTE — Progress Notes (Signed)
Pt sleeping, but when awakened, she reports chest discomfort is completely resolved.

## 2017-06-27 NOTE — Progress Notes (Signed)
Called to patients room.  Son had stood patient up beside the bed.  She was doing well, when the son called for this RN.  Pt was found standing with son holding her arms.  She was bouncing on the balls of her feet and moaning.  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  He was unable to make her sit on the hospital bed for approximately 30 seconds.  When she finally sat, she appeared to go limp and lay her head against his chest.  She however did maintain her spinal posture.  Her arms/legs and neck only went limp.  We placed her back in hospital bed, and she started talking within about 1 minute.  Asking for her head to be raised and saying her chest hurt from her son performing a sternal rub on her.  She then became nauseated, and zofran was given.  She is currently resting in the bed, VSS.  NAD noted.  She appears somnolent, but intermittently talking to son.

## 2017-06-28 LAB — GLUCOSE, CAPILLARY
GLUCOSE-CAPILLARY: 115 mg/dL — AB (ref 65–99)
GLUCOSE-CAPILLARY: 245 mg/dL — AB (ref 65–99)
Glucose-Capillary: 109 mg/dL — ABNORMAL HIGH (ref 65–99)
Glucose-Capillary: 174 mg/dL — ABNORMAL HIGH (ref 65–99)

## 2017-06-28 LAB — HEMOGLOBIN A1C
HEMOGLOBIN A1C: 6.7 % — AB (ref 4.8–5.6)
Mean Plasma Glucose: 145.59 mg/dL

## 2017-06-28 MED ORDER — ATORVASTATIN CALCIUM 20 MG PO TABS
80.0000 mg | ORAL_TABLET | Freq: Every day | ORAL | Status: DC
  Administered 2017-06-28: 80 mg via ORAL
  Filled 2017-06-28: qty 4

## 2017-06-28 MED ORDER — GUAIFENESIN-DM 100-10 MG/5ML PO SYRP
5.0000 mL | ORAL_SOLUTION | ORAL | Status: DC | PRN
  Administered 2017-06-28 – 2017-06-29 (×3): 5 mL via ORAL
  Filled 2017-06-28 (×4): qty 5

## 2017-06-28 MED ORDER — INSULIN GLARGINE 100 UNIT/ML ~~LOC~~ SOLN
26.0000 [IU] | Freq: Every day | SUBCUTANEOUS | Status: DC
  Administered 2017-06-28: 26 [IU] via SUBCUTANEOUS
  Filled 2017-06-28 (×2): qty 0.26

## 2017-06-28 NOTE — Consult Note (Signed)
Name: Leah FishmanDonnie R [place] MRN: [id number] DOB: 03/23/1941    ADMISSION DATE:  06/26/2017 CONSULTATION DATE: 06/26/2017  REFERRING MD : Dr. Judithann SheenSparks  CHIEF COMPLAINT: Chest Pain   BRIEF PATIENT DESCRIPTION: 76 yo female admitted 11/3 with unstable angina and hypertensive urgency requiring nitroglycerin gtt   SIGNIFICANT EVENTS  11/3-Pt admitted to the stepdown unit   STUDIES:  Echo>>  HISTORY OF PRESENT ILLNESS:   This is a [age] yo female with a PMH of Type II DM, TIA, Seizures, Psoriatic Arthritis, NSTEMI, MI (1991), Remote CABG, HTN, Hypercholesterolemia, CAD, Chronic Stable Angina, Arthritis, and GERD.  [facility] with c/o a coughing spell and development of non radiating severe chest pressure in the center of her chest similar to chronic angina symptoms.  Due to symptoms EMS was notified and upon arrival to the ER a code STEMI was initiated due to EKG revealing ST elevation isolated in aVR.  It was also noted the pts systolic bp was significantly elevated at 220 mmHg.  In the ER she received sublingual nitro with slight improvement of pain and a nitroglycerin gtt was started. Cardiology evaluated pt and EKG determining the EKG was not a diagnostic STEMI, and placed pt on a heparin gtt.  [facility] hospital[facility] team for further workup and treatment PCCM consulted.     SUBJECTIVE Alert and awake NAD Ok to  Transfer to gen med floor Pt states chest pain has significantly improved no other complaints at this time.     REVIEW OF SYSTEMS: Positives in BOLD  Constitutional: Negative for fever, chills, weight loss, malaise/fatigue and diaphoresis.  HENT: Negative for hearing loss, ear pain, nosebleeds, congestion, sore throat, neck pain, tinnitus and ear discharge.   Eyes: Negative for blurred vision, double vision, photophobia, pain, discharge and redness.  Respiratory: intermittent cough, hemoptysis, sputum production, shortness  of breath, wheezing and stridor.   Cardiovascular: chest pain, palpitations, orthopnea, claudication, leg swelling and PND.  Gastrointestinal: Negative for heartburn, nausea, vomiting, abdominal pain, diarrhea, constipation, blood in stool and melena.  Genitourinary: Negative for dysuria, urgency, frequency, hematuria and flank pain.  Musculoskeletal: Negative for myalgias, back pain, joint pain and falls.  Skin: Negative for itching and rash.  Neurological: Negative for dizziness, tingling, tremors, sensory change, speech change, focal weakness, seizures, loss of consciousness, weakness and headaches.  Endo/Heme/Allergies: Negative for environmental allergies and polydipsia. Does not bruise/bleed easily.  SUBJECTIVE:   VITAL SIGNS: Temp:  [97.8 F (36.6 C)-98.1 F (36.7 C)] 98.1 F (36.7 C) (11/05 0814) Pulse Rate:  [53-69] 57 (11/05 0814) Resp:  [12-23] 13 (11/05 0814) BP: (96-179)/(33-66) 108/59 (11/05 0814) SpO2:  [94 %-100 %] 97 % (11/05 0814) PHYSICAL EXAMINATION: General: well developed, well nourished female, NAD  Neuro: alert and oriented, follows commands  HEENT: supple, no JVD Cardiovascular: s1s2, rrr, no M/R/G  Lungs: clear throughout, even, non labored  Abdomen: +BS x4, soft, non tender, non distended  Musculoskeletal: normal bulk and tone, no edema  Skin: intact no rashes or lesions    Recent Labs  Lab 06/26/17 1608 06/27/17 0732  NA 129* 137  K 4.6 4.2  CL 98* 107  CO2 21* 24  BUN 15 9  CREATININE 0.82 0.65  GLUCOSE 220* 80   Recent Labs  Lab 06/26/17 1608 06/27/17 0732  HGB 10.8* 8.9*  HCT 32.7* 28.6*  WBC 5.1 3.7  PLT 217 170   Dg Chest Portable 1 View  Result Date:  06/26/2017 CLINICAL DATA:  chest pain, non smoker. Hx of CAD, bypass 1997, NSTEMI 02- 2017, angio with stent placement 10-2015 EXAM: PORTABLE CHEST 1 VIEW COMPARISON:  09/02/2016 FINDINGS: Stable changes from prior CABG surgery. Cardiac silhouette is normal in size. No mediastinal or  hilar masses. No evidence of adenopathy. Clear lungs. No pleural effusion or pneumothorax. Skeletal structures are demineralized but grossly intact. IMPRESSION: No acute cardiopulmonary disease. Electronically Signed   By: Amie Portland M.D.   On: 06/26/2017 17:06    ASSESSMENT / PLAN: Unstable Angina Hypertensive Urgency Hyponatremia  Hx: Diabetes Mellitus, Chronic Stable Angina, Hypercholesteremia, and Seizures  P: Supplemental O2 for dyspnea and/or to maintain O2 sats >92% Cardiology consulted appreciate input  Follow up cardiology recs Monitor for s/sx of bleeding Transfuse for hgb <7 Continue oral cardiac medications Continue outpatient anticonvulsant medication Continue SSI, lantus, and glipizide     Lucie Leather, M.D.  [facility]  Medical Director [facility] Medical Director [facility] Cardio-Pulmonary Department

## 2017-06-28 NOTE — Progress Notes (Signed)
[facility] [place] [facility]   PATIENT NAME: Leah FrederickDonnie Lomba    MR#:  [id number]  DATE OF BIRTH:  October 25, 1940  SUBJECTIVE:   Denies any chest pain doing well.  Ate good lunch. REVIEW OF SYSTEMS:   Review of Systems  Constitutional: Negative for chills, fever and weight loss.  HENT: Negative for ear discharge, ear pain and nosebleeds.   Eyes: Negative for blurred vision, pain and discharge.  Respiratory: Positive for cough. Negative for sputum production, shortness of breath, wheezing and stridor.   Cardiovascular: Negative for chest pain, palpitations, orthopnea and PND.  Gastrointestinal: Negative for abdominal pain, diarrhea, nausea and vomiting.  Genitourinary: Negative for frequency and urgency.  Musculoskeletal: Negative for back pain and joint pain.  Neurological: Positive for weakness. Negative for sensory change, speech change and focal weakness.  Psychiatric/Behavioral: Negative for depression and hallucinations. The patient is not nervous/anxious.    Tolerating Diet:yes Tolerating PT: pending  DRUG ALLERGIES:   Allergies  Allergen Reactions  . Talwin [Pentazocine] Anaphylaxis and Swelling    Throat swelling  . Valium [Diazepam] Other (See Comments)    Makes her feel like she's flying  . Vicodin [Hydrocodone-Acetaminophen] Nausea And Vomiting  . Adhesive [Tape] Other (See Comments)    Bruising and TEARING!!!  Please use "paper" tape  . Beta Adrenergic Blockers Other (See Comments)    Pt states she not allergic  . Levofloxacin Swelling and Other (See Comments)    Tongue swells   . Simvastatin Swelling and Rash    Mouth swelling    VITALS:  Blood pressure 95/72, pulse (!) 51, temperature 98.4 F (36.9 C), temperature source Oral, resp. rate 17, height 5\' 3"  (1.6 m), weight 64.8 kg (142 lb 13.7 oz), SpO2 96 %.  PHYSICAL EXAMINATION:   Physical Exam  GENERAL:  76 y.o.-year-old patient lying in the bed with no acute distress.   EYES: Pupils equal, round, reactive to light and accommodation. No scleral icterus. Extraocular muscles intact.  HEENT: Head atraumatic, normocephalic. Oropharynx and nasopharynx clear.  NECK:  Supple, no jugular venous distention. No thyroid enlargement, no tenderness.  LUNGS: Normal breath sounds bilaterally, no wheezing, rales, rhonchi. No use of accessory muscles of respiration.  CARDIOVASCULAR: S1, S2 normal. No murmurs, rubs, or gallops.  ABDOMEN: Soft, nontender, nondistended. Bowel sounds present. No organomegaly or mass.  EXTREMITIES: No cyanosis, clubbing or edema b/l.    NEUROLOGIC: Cranial nerves II through XII are intact. No focal Motor or sensory deficits b/l.   PSYCHIATRIC:  patient is alert and oriented x 3.  SKIN: No obvious rash, lesion, or ulcer.   LABORATORY PANEL:  CBC Recent Labs  Lab 06/27/17 0732  WBC 3.7  HGB 8.9*  HCT 28.6*  PLT 170    Chemistries  Recent Labs  Lab 06/27/17 0732  NA 137  K 4.2  CL 107  CO2 24  GLUCOSE 80  BUN 9  CREATININE 0.65  CALCIUM 8.1*  AST 22  ALT 13*  ALKPHOS 45  BILITOT 0.6   Cardiac Enzymes Recent Labs  Lab 06/27/17 0732  TROPONINI <0.03   RADIOLOGY:  Dg Chest Portable 1 View  Result Date: 06/26/2017 CLINICAL DATA:  chest pain, non smoker. Hx of CAD, bypass 1997, NSTEMI 02- 2017, angio with stent placement 10-2015 EXAM: PORTABLE CHEST 1 VIEW COMPARISON:  09/02/2016 FINDINGS: Stable changes from prior CABG surgery. Cardiac silhouette is normal in size. No mediastinal or hilar masses. No evidence of adenopathy. Clear lungs. No pleural effusion  or pneumothorax. Skeletal structures are demineralized but grossly intact. IMPRESSION: No acute cardiopulmonary disease. Electronically Signed   By: Amie Portland M.D.   On: 06/26/2017 17:06   ASSESSMENT AND PLAN:  76 year old admitted with chest pain with known history of coronary artery disease    1. Unstable angina pectoris [facility]) patient seen by cardiology medical  management recommended, - patient nitroglycerin has been discontinued, -we can add Ranexa on discharge cardiology plans no intervention at this point -Continue aspirin Plavix -troponin x3 negative  2.  Accelerated hypertension present on admission nitroglycerin has been discontinued pt bp was low last night -  Now improved, can resume home meds per ICU md  3.Diabetes mellitus with complication (HCC) - continue sliding scale insulin, glipizide and Lantus   4. Hyperlipidemia unspecified continue Lipitor  Case discussed with Care Management/Social Worker. Management plans discussed with the patient, family and they are in agreement.  CODE STATUS: Dnr  DVT Prophylaxis: lovenox  TOTAL TIME TAKING CARE OF THIS PATIENT: 30 minutes.  >50% time spent on counselling and coordination of care  POSSIBLE D/C IN 1-2 DAYS, DEPENDING ON CLINICAL CONDITION.  Note: This dictation was prepared with Dragon dictation along with smaller phrase technology. Any transcriptional errors that result from this process are unintentional.  Lin Glazier M.D on 06/28/2017 at 3:03 PM  Between 7am to 6pm - Pager - [phone]  After 6pm go to [email or web address] - Social research officer, government  [facility]  Office  [phone]  CC: Primary care physician; Marguarite Arbour, MD

## 2017-06-28 NOTE — Progress Notes (Signed)
[facility]  Patient: Leah FishmanDonnie R Small / Admit Date: 06/26/2017 / Date of Encounter: 06/28/2017, 8:22 AM   Subjective: Patient had somewhat tenuous blood pressure yesterday and required discontinuation of nitrates. No evidence of further chest discomfort overnight. Patient has been on dual antiplatelet therapy and tolerating well. Patient has diffuse three-vessel disease and small vessel coronary artery disease with continued evidence of recurrent angina without evidence of myocardial infarction with a normal troponin today  Review of Systems: Positive for: Chest pain Negative for: Vision change, hearing change, syncope, dizziness, nausea, vomiting,diarrhea, bloody stool, stomach pain, cough, congestion, diaphoresis, urinary frequency, urinary pain,skin lesions, skin rashes Others previously listed  Objective: Telemetry: Normal sinus rhythm  Physical Exam: Blood pressure (!) 108/59, pulse (!) 57, temperature 98.1 F (36.7 C), temperature source Oral, resp. rate 13, height 5\' 3"  (1.6 m), weight 64.8 kg (142 lb 13.7 oz), SpO2 97 %. Body mass index is 25.31 kg/m. General: Well developed, well nourished, in no acute distress. Head: Normocephalic, atraumatic, sclera non-icteric, no xanthomas, nares are without discharge. Neck: No apparent masses Lungs: Normal respirations with no wheezes, no rhonchi, no rales , no crackles   Heart: Regular rate and rhythm, normal S1 S2, no murmur, no rub, no gallop, PMI is normal size and placement, carotid upstroke normal without bruit, jugular venous pressure normal Abdomen: Soft, non-tender, non-distended with normoactive bowel sounds. No hepatosplenomegaly. Abdominal aorta is normal size without bruit Extremities: No edema, no clubbing, no cyanosis, no ulcers,  Peripheral: 2+ radial, 2+ femoral, 2+ dorsal pedal pulses Neuro: Alert and oriented. Moves all extremities spontaneously. Psych:  Responds to questions  appropriately with a normal affect.   Intake/Output Summary (Last 24 hours) at 06/28/2017 0822 Last data filed at 06/27/2017 1801 Gross per 24 hour  Intake -  Output 825 ml  Net -825 ml    Inpatient Medications:  . aspirin EC  81 mg Oral Q2000  . atorvastatin  40 mg Oral Q2000  . brimonidine  1 drop Both Eyes BID  . clopidogrel  75 mg Oral BH-q7a  . docusate sodium  100 mg Oral BID  . famotidine  20 mg Oral Daily  . glipiZIDE  10 mg Oral q morning - 10a  . insulin aspart  0-9 Units Subcutaneous TID WC  . insulin glargine  24 Units Subcutaneous Q2000  . lamoTRIgine  50 mg Oral BID  . levETIRAcetam  750 mg Oral BID  . timolol  1 drop Both Eyes BID   Infusions:   Labs: Recent Labs    06/26/17 1608 06/27/17 0732  NA 129* 137  K 4.6 4.2  CL 98* 107  CO2 21* 24  GLUCOSE 220* 80  BUN 15 9  CREATININE 0.82 0.65  CALCIUM 9.1 8.1*   Recent Labs    06/26/17 1608 06/27/17 0732  AST 32 22  ALT 17 13*  ALKPHOS 52 45  BILITOT 1.0 0.6  PROT 8.5* 6.5  ALBUMIN 4.6 3.4*   Recent Labs    06/26/17 1608 06/27/17 0732  WBC 5.1 3.7  NEUTROABS 2.0  --   HGB 10.8* 8.9*  HCT 32.7* 28.6*  MCV 84.1 84.3  PLT 217 170   Recent Labs    06/26/17 1608 06/26/17 1927 06/27/17 0120 06/27/17 0732  TROPONINI <0.03 <0.03 <0.03 <0.03   Invalid input(s): POCBNP No results for input(s): HGBA1C in the last 72 hours.   Weights: Filed Weights   06/26/17 1621 06/27/17 0428  Weight: 64.9 kg (143  lb) 64.8 kg (142 lb 13.7 oz)     Radiology/Studies:  Dg Chest Portable 1 View  Result Date: 06/26/2017 CLINICAL DATA:  chest pain, non smoker. Hx of CAD, bypass 1997, NSTEMI 02- 2017, angio with stent placement 10-2015 EXAM: PORTABLE CHEST 1 VIEW COMPARISON:  09/02/2016 FINDINGS: Stable changes from prior CABG surgery. Cardiac silhouette is normal in size. No mediastinal or hilar masses. No evidence of adenopathy. Clear lungs. No pleural effusion or pneumothorax. Skeletal structures are  demineralized but grossly intact. IMPRESSION: No acute cardiopulmonary disease. Electronically Signed   By: Amie Portland M.D.   On: 06/26/2017 17:06     Assessment and Recommendation  76 y.o. female with known diffuse three-vessel coronary disease small vessel disease as well with recurrent episodes of severe chest discomfort without evidence of myocardial infarction or congestive heart failure 1. Continue supportive care including dual antiplatelet therapy for coronary artery disease 2. Continue high intensity cholesterol therapy with atorvastatin 3. Trial of reinstatement of isosorbide today 4. Consider reinstatement of Ranexa 500 mg twice per day as well for chest discomfort 5. Begin ambulation and follow for improvements of symptoms and possible discharge if able  Signed, Arnoldo Hooker M.D. FACC

## 2017-06-28 NOTE — Progress Notes (Signed)
Pt is alert and oriented x 4 and in stable condition. Pt states that her chest pain is "worse with coughing and taking deep breaths but, is better now." She feels like she has a lot of mucus in the back of her throat that she can't get out. Otherwise pt is in stable condition. Will continue to assess.

## 2017-06-29 LAB — GLUCOSE, CAPILLARY: GLUCOSE-CAPILLARY: 198 mg/dL — AB (ref 65–99)

## 2017-06-29 MED ORDER — ISOSORBIDE MONONITRATE ER 60 MG PO TB24: 60.0000 mg | ORAL_TABLET | Freq: Every day | ORAL | 0 refills | Status: DC

## 2017-06-29 MED ORDER — GUAIFENESIN-DM 100-10 MG/5ML PO SYRP: 5.0000 mL | ORAL_SOLUTION | ORAL | 0 refills | Status: DC | PRN

## 2017-06-29 MED ORDER — SENNOSIDES-DOCUSATE SODIUM 8.6-50 MG PO TABS: 1.0000 | ORAL_TABLET | Freq: Two times a day (BID) | ORAL | Status: DC

## 2017-06-29 NOTE — Discharge Instructions (Signed)
Acute Coronary Syndrome °Acute coronary syndrome (ACS) is a serious problem in which there is suddenly not enough blood and oxygen supplied to the heart. ACS may mean that one or more of the blood vessels in your heart (coronary arteries) may be blocked. ACS can result in chest pain or a heart attack (myocardial infarction or MI). °What are the causes? °This condition is caused by atherosclerosis, which is the buildup of fat and cholesterol (plaque) on the inside of the arteries. Over time, the plaque may narrow or block the artery, and this will lessen blood flow to the heart. Plaque can also become weak and break off within a coronary artery to form a clot and cause a sudden blockage. °What increases the risk? °The risk factors of this condition include: °· High cholesterol levels. °· High blood pressure (hypertension). °· Smoking. °· Diabetes. °· Age. °· Family history of chest pain, heart disease, or stroke. °· Lack of exercise. °What are the signs or symptoms? °The most common signs of this condition include: °· Chest pain, which can be: °¨ A crushing or squeezing in the chest. °¨ A tightness, pressure, fullness, or heaviness in the chest. °¨ Present for more than a few minutes, or it can stop and recur. °· Pain in the arms, neck, jaw, or back. °· Unexplained heartburn or indigestion. °· Shortness of breath. °· Nausea. °· Sudden cold sweats. °· Feeling light-headed or dizzy. °Sometimes, this condition has no symptoms. °How is this diagnosed? °ACS may be diagnosed through the following tests: °· Electrocardiogram (ECG). °· Blood tests. °· Coronary angiogram. This is a procedure to look at the coronary arteries to see if there is any blockage. °How is this treated? °Treatment for ACS may include: °· Healthy behavioral changes to reduce or control risk factors. °· Medicine. °· Coronary stenting. A stent helps to keep an artery open. °· Coronary angioplasty. This procedure widens a narrowed or blocked  artery. °· Coronary artery bypass surgery. This will allow your blood to pass the blockage (bypass) to reach your heart. °Follow these instructions at home: °Eating and drinking °· Follow a heart-healthy diet. A dietitian can you help to educate you about healthy food options and changes. °· Use healthy cooking methods such as roasting, grilling, broiling, baking, poaching, steaming, or stir-frying. Talk to a dietitian to learn more about healthy cooking methods. °Medicines °· Take medicines only as directed by your health care provider. °· Do not take the following medicines unless your health care provider approves: °¨ Nonsteroidal anti-inflammatory drugs (NSAIDs), such as ibuprofen, naproxen, or celecoxib. °¨ Vitamin supplements that contain vitamin A, vitamin E, or both. °¨ Hormone replacement therapy that contains estrogen with or without progestin. °· Stop illegal drug use. °Activity °· Follow an exercise program that is approved by your health care provider. °· Plan rest periods when you are fatigued. °Lifestyle °· Do not use any tobacco products, including cigarettes, chewing tobacco, or electronic cigarettes. If you need help quitting, ask your health care provider. °· If you drink alcohol, and your health care provider approves, limit your alcohol intake to no more than 1 drink per day. One drink equals 12 ounces of beer, 5 ounces of wine, or 1½ ounces of hard liquor. °· Learn to manage stress. °· Maintain a healthy weight. Lose weight as approved by your health care provider. °General instructions °· Manage other health conditions, such as hypertension and diabetes, as directed by your health care provider. °· Keep all follow-up visits as directed by your   as directed by your health care provider. This is important.  Your health care provider may ask you to monitor your blood pressure. A blood pressure reading consists of a higher number over a lower number, such as 110 over 72, written as 110/72. Ideally, your blood  pressure should be: ? Below 140/90 if you have no other medical conditions. ? Below 130/80 if you have diabetes or kidney disease. Get help right away if:  You have pain in your chest, neck, arm, jaw, stomach, or back that lasts more than a few minutes, is recurring, or is not relieved by taking medicine under your tongue (sublingual nitroglycerin).  You have profuse sweating without cause.  You have unexplained: ? Heartburn or indigestion. ? Shortness of breath or difficulty breathing. ? Nausea or vomiting. ? Fatigue. ? Feelings of nervousness or anxiety. ? Weakness. ? Diarrhea.  You have sudden light-headedness or dizziness.  You faint. These symptoms may represent a serious problem that is an emergency. Do not wait to see if the symptoms will go away. Get medical help right away. Call your local emergency services (911 in the [place] Do not drive yourself to the clinic or hospital. This information is not intended to replace advice given to you by your health care provider. Make sure you discuss any questions you have with your health care provider. Document Released: 08/10/2005 Document Revised: 01/22/2016 Document Reviewed: 12/12/2013 Elsevier Interactive Patient Education  2017 Elsevier Inc.   Acute Pain, Adult Acute pain is a type of pain that may last for just a few days or as long as six months. It is often related to an illness, injury, or medical procedure. Acute pain may be mild, moderate, or severe. It usually goes away once your injury has healed or you are no longer ill. Pain can make it hard for you to do daily activities. It can cause anxiety and lead to other problems if left untreated. Treatment depends on the cause and severity of your acute pain. Follow these instructions at home:  Check your pain level as told by your health care provider.  Take over-the-counter and prescription medicines only as told by your health care provider.  If you are taking  prescription pain medicine: ? Ask your health care provider about taking a stool softener or laxative to prevent constipation. ? Do not stop taking the medicine suddenly. Talk to your health care provider about how and when to discontinue prescription pain medicine. ? If your pain is severe, do not take more pills than instructed by your health care provider. ? Do not take other over-the-counter pain medicines in addition to this medicine unless told by your health care provider. ? Do not drive or operate heavy machinery while taking prescription pain medicine.  Apply ice or heat as told by your health care provider. These may reduce swelling and pain.  Ask your health care provider if other strategies such as distraction, relaxation, or physical therapies can help your pain.  Keep all follow-up visits as told by your health care provider. This is important. Contact a health care provider if:  You have pain that is not controlled by medicine.  Your pain does not improve or gets worse.  You have side effects from pain medicines, such as vomitingor confusion. Get help right away if:  You have severe pain.  You have trouble breathing.  You lose consciousness.  You have chest pain or pressure that lasts for more than a few minutes. Along with the  chest pain you may: ? Have pain or discomfort in one or both arms, your back, neck, jaw, or stomach. ? Have shortness of breath. ? Break out in a cold sweat. ? Feel nauseous. ? Become light-headed. These symptoms may represent a serious problem that is an emergency. Do not wait to see if the symptoms will go away. Get medical help right away. Call your local emergency services (911 in the [place] Do not drive yourself to the hospital. This information is not intended to replace advice given to you by your health care provider. Make sure you discuss any questions you have with your health care provider. Document Released: 08/25/2015 Document  Revised: 01/17/2016 Document Reviewed: 08/25/2015 Elsevier Interactive Patient Education  [employer]2018 Elsevier Inc.

## 2017-06-29 NOTE — Discharge Summary (Signed)
[facility] [place] [facility]   PATIENT NAME: Leah Small    MR#:  [id number]  DATE OF BIRTH:  December 13, 1940  DATE OF ADMISSION:  06/26/2017 ADMITTING PHYSICIAN: Marguarite ArbourJeffrey D Sparks, MD  DATE OF DISCHARGE: 06/29/2017  PRIMARY CARE PHYSICIAN: Marguarite ArbourSparks, Jeffrey D, MD    ADMISSION DIAGNOSIS:  STEMI (ST elevation myocardial infarction) (HCC) [I21.3] Chest pain, unspecified type [R07.9]  DISCHARGE DIAGNOSIS:  Unstable angina/Chest pain with h/o CAD Cough HTN SECONDARY DIAGNOSIS:   Past Medical History:  Diagnosis Date  . Acid reflux   . Arthritis    "all over"  . Chronic stable angina (HCC)   . Coronary artery disease   . Hypercholesterolemia   . Hypertension   . Myocardial infarction (HCC) 1991; 07/06/2015  . NSTEMI (non-ST elevated myocardial infarction) (HCC) 10/22/2015  . Psoriatic arthritis (HCC)   . 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 TIA (transient ischemic attack)    "several over the last 20 years" (10/22/2015)  . Type II diabetes mellitus [facility](HCC)     HOSPITAL COURSE:  76 year old admitted with chest pain with known history of coronary artery disease  1.Unstable angina pectoris (HCC)patient seen by cardiology medical management recommended, -Imdur,nitro patch and prn nitro (home meds) -spoke with cardiology Dr Gwen Poundskowalski plans no intervention at this point -Continue aspirin, Plavix -troponin x3 negative  2.Accelerated hypertension present on admission nitroglycerin has been discontinued pt bp was low last night - Now improved -resumed home meds  3.Diabetes mellitus with complication (HCC) -continue sliding scale insulin,glipizide and Lantus  4. Hyperlipidemia unspecified continue Lipitor  5. Cough prn Robitussin  D/c home with out pt f/u with DR K next Monday (pt has appt)  CONSULTS OBTAINED:  Treatment Team:  [facility], [facility], MD Lamar BlinksKowalski, Bruce  J, MD  DRUG ALLERGIES:   Allergies  Allergen Reactions  . Talwin [Pentazocine] Anaphylaxis and Swelling    Throat swelling  . Valium [Diazepam] Other (See Comments)    Makes her feel like she's flying  . Vicodin [Hydrocodone-Acetaminophen] Nausea And Vomiting  . Adhesive [Tape] Other (See Comments)    Bruising and TEARING!!!  Please use "paper" tape  . Beta Adrenergic Blockers Other (See Comments)    Pt states she not allergic  . Levofloxacin Swelling and Other (See Comments)    Tongue swells   . Simvastatin Swelling and Rash    Mouth swelling    DISCHARGE MEDICATIONS:   Current Discharge Medication List    START taking these medications   Details  guaiFENesin-dextromethorphan (ROBITUSSIN DM) 100-10 MG/5ML syrup Take 5 mLs every 4 (four) hours as needed by mouth for cough. Qty: 118 mL, Refills: 0      CONTINUE these medications which have CHANGED   Details  isosorbide mononitrate (IMDUR) 60 MG 24 hr tablet Take 1 tablet (60 mg total) daily by mouth. Qty: 30 tablet, Refills: 0      CONTINUE these medications which have NOT CHANGED   Details  Adalimumab (HUMIRA PEN [address]) Inject 1 application into the skin See admin instructions. Patient takes it twice monthly.The next one is due June 26th    aspirin EC 81 MG tablet Take 81 mg by mouth at bedtime.    atorvastatin (LIPITOR) 80 MG tablet Take 1 tablet (80 mg total) by mouth at bedtime. Qty: 30 tablet, Refills: 3    brimonidine (ALPHAGAN) 0.2 % ophthalmic solution Place 1 drop into both eyes 2 (two)  times daily.     clopidogrel (PLAVIX) 75 MG tablet Take 75 mg by mouth every morning.     glipiZIDE (GLUCOTROL XL) 10 MG 24 hr tablet Take 10 mg by mouth every morning.    Insulin Glargine (LANTUS SOLOSTAR) 100 UNIT/ML Solostar Pen Inject 24 Units into the skin at bedtime.    insulin lispro (HUMALOG) 100 UNIT/ML injection Inject 6-8 Units into the skin 3 (three) times daily before meals. Per sliding scale: 6 units plus:    CBG 150-200 add 1 unit, 201-250 add 2 units, 251-300 add 3 units, 301-350 add 4 units 351-400 add 5 units, >400 add 6 units and call MD    lamoTRIgine (LAMICTAL) 25 MG tablet Take 50 mg by mouth 2 (two) times daily.    levETIRAcetam (KEPPRA) 750 MG tablet Take 750 mg by mouth 2 (two) times daily.    metFORMIN (GLUCOPHAGE) 1000 MG tablet Take 1 tablet (1,000 mg total) by mouth 2 (two) times daily.    metoprolol tartrate (LOPRESSOR) 25 MG tablet Take 1 tablet (25 mg total) by mouth at bedtime. Qty: 30 tablet, Refills: 12    ranitidine (ZANTAC) 150 MG tablet Take 150 mg by mouth every morning.     timolol (TIMOPTIC) 0.5 % ophthalmic solution Place 1 drop into both eyes 2 (two) times daily.    triamcinolone ointment (KENALOG) 0.1 % Apply 1 application topically 3 (three) times daily as needed (itching from psoriasis).    vitamin B-12 (CYANOCOBALAMIN) 1000 MCG tablet Take 1,000 mcg by mouth every morning.     vitamin E 400 UNIT capsule Take 400 Units by mouth every morning.     nitroGLYCERIN (NITRODUR - DOSED IN MG/24 HR) 0.4 mg/hr patch Place 0.4 mg onto the skin daily as needed (for chest pain).     nitroGLYCERIN (NITROSTAT) 0.4 MG SL tablet Place 0.4 mg under the tongue every 5 (five) minutes as needed for chest pain.    nystatin cream (MYCOSTATIN) Apply 1 application topically 2 (two) times daily as needed (yeast infection).         If you experience worsening of your admission symptoms, develop shortness of breath, life threatening emergency, suicidal or homicidal thoughts you must seek medical attention immediately by calling 911 or calling your MD immediately  if symptoms less severe.  You Must read complete instructions/literature along with all the possible adverse reactions/side effects for all the Medicines you take and that have been prescribed to you. Take any new Medicines after you have completely understood and accept all the possible adverse reactions/side effects.    Please note  You were cared for by a hospitalist during your hospital stay. If you have any questions about your discharge medications or the care you received while you were in the hospital after you are discharged, you can call the unit and asked to speak with the hospitalist on call if the hospitalist that took care of you is not available. Once you are discharged, your primary care physician will handle any further medical issues. Please note that NO REFILLS for any discharge medications will be authorized once you are discharged, as it is imperative that you return to your primary care physician (or establish a relationship with a primary care physician if you do not have one) for your aftercare needs so that they can reassess your need for medications and monitor your lab values. Today   SUBJECTIVE   Doing well. No cp  VITAL SIGNS:  Blood pressure (!) 116/53, pulse 71,  temperature 97.9 F (36.6 C), temperature source Oral, resp. rate 19, height 5\' 3"  (1.6 m), weight 60.9 kg (134 lb 4.2 oz), SpO2 92 %.  I/O:    Intake/Output Summary (Last 24 hours) at 06/29/2017 1117 Last data filed at 06/29/2017 0100 Gross per 24 hour  Intake 268.75 ml  Output 750 ml  Net -481.25 ml    PHYSICAL EXAMINATION:  GENERAL:  76 y.o.-year-old patient lying in the bed with no acute distress.  EYES: Pupils equal, round, reactive to light and accommodation. No scleral icterus. Extraocular muscles intact.  HEENT: Head atraumatic, normocephalic. Oropharynx and nasopharynx clear.  NECK:  Supple, no jugular venous distention. No thyroid enlargement, no tenderness.  LUNGS: Normal breath sounds bilaterally, no wheezing, rales,rhonchi or crepitation. No use of accessory muscles of respiration.  CARDIOVASCULAR: S1, S2 normal. No murmurs, rubs, or gallops.  ABDOMEN: Soft, non-tender, non-distended. Bowel sounds present. No organomegaly or mass.  EXTREMITIES: No pedal edema, cyanosis, or clubbing.  NEUROLOGIC:  Cranial nerves II through XII are intact. Muscle strength 5/5 in all extremities. Sensation intact. Gait not checked.  PSYCHIATRIC: The patient is alert and oriented x 3.  SKIN: No obvious rash, lesion, or ulcer.   DATA REVIEW:   CBC  Recent Labs  Lab 06/27/17 0732  WBC 3.7  HGB 8.9*  HCT 28.6*  PLT 170    Chemistries  Recent Labs  Lab 06/27/17 0732  NA 137  K 4.2  CL 107  CO2 24  GLUCOSE 80  BUN 9  CREATININE 0.65  CALCIUM 8.1*  AST 22  ALT 13*  ALKPHOS 45  BILITOT 0.6    Microbiology Results   Recent Results (from the past 240 hour(s))  MRSA PCR Screening     Status: None   Collection Time: 06/26/17  7:14 PM  Result Value Ref Range Status   MRSA by PCR NEGATIVE NEGATIVE Final    Comment:        The GeneXpert MRSA Assay (FDA approved for NASAL specimens only), is one component of a comprehensive MRSA colonization surveillance program. It is not intended to diagnose MRSA infection nor to guide or monitor treatment for MRSA infections.     RADIOLOGY:  No results found.   Management plans discussed with the patient, family and they are in agreement.  CODE STATUS:     Code Status Orders  (From admission, onward)        Start     Ordered   06/26/17 1921  Do not attempt resuscitation (DNR)  Continuous    Question Answer Comment  In the event of cardiac or respiratory ARREST Do not call a "code blue"   In the event of cardiac or respiratory ARREST Do not perform Intubation, CPR, defibrillation or ACLS   In the event of cardiac or respiratory ARREST Use medication by any route, position, wound care, and other measures to relive pain and suffering. May use oxygen, suction and manual treatment of airway obstruction as needed for comfort.      06/26/17 1920    Code Status History    Date Active Date Inactive Code Status Order ID Comments User Context   07/21/2016 22:50 07/23/2016 16:05 DNR [id number]  Thurmon Fair, MD Inpatient   01/29/2016 00:31  01/30/2016 18:09 DNR [id number]  Quintella Reichert, MD Inpatient   11/20/2015 22:07 11/22/2015 15:25 DNR [id number]  Briscoe Deutscher, MD ED   10/22/2015 18:23 10/24/2015 14:14 DNR [id number]  Eddie North, MD Inpatient   07/06/2015 18:04 07/10/2015  16:56 Full Code [id number]  Ozella RocksMerrell, David J, MD ED    Advance Directive Documentation     Most Recent Value  Type of Advance Directive  Out of facility DNR (pink MOST or yellow form)  Pre-existing out of facility DNR order (yellow form or pink MOST form)  Yellow form placed in chart (order not valid for inpatient use)  "MOST" Form in Place?  No data      TOTAL TIME TAKING CARE OF THIS PATIENT: *40* minutes.    Marleah Beever M.D on 06/29/2017 at 11:17 AM  Between 7am to 6pm - Pager - [phone] After 6pm go to [email or web address] - Social research officer, [email or web address] [email or web address]  [facility]  Office  [phone]  CC: Primary care physician; Marguarite ArbourSparks, Jeffrey D, MD

## 2017-06-29 NOTE — Progress Notes (Signed)
Pt discharged in stable condition. All belongings with patient. Pt transported to visitor entrance by volunteer services. Pt being transported home by family member.

## 2017-07-06 ENCOUNTER — Ambulatory Visit
Admission: RE | Admit: 2017-07-06 | Discharge: 2017-07-06 | Disposition: A | Discharge status: Home / Self Care | Payer: Medicare Other | Source: Ambulatory Visit | Attending: Internal Medicine | Admitting: Internal Medicine

## 2017-07-06 DIAGNOSIS — Z1231 Encounter for screening mammogram for malignant neoplasm of breast: Secondary | ICD-10-CM | POA: Insufficient documentation

## 2017-07-12 IMAGING — CT CT HEAD W/O CM
2 series · 15 of 30 positions shown, 19 images · non-contrast
Comparison: MRI 02/06/2011, CT HEAD 08/09/2010

CLINICAL DATA: CODE STROKE, DR. DANIL [phone], LEFT SIDE
WEAKNESS, last seen normal at 8377, HX CVA.

EXAM:
CT HEAD WITHOUT CONTRAST
TECHNIQUE: Contiguous axial images were obtained from the base of the skull
through the vertex without intravenous contrast.

[Series 201: head w/o, idose (1) · axial · non-contrast · 0.44mm/px · z∈[+88,+208]mm · 13 of 30 slices shown, 17 images]
[im 3/30  brain]
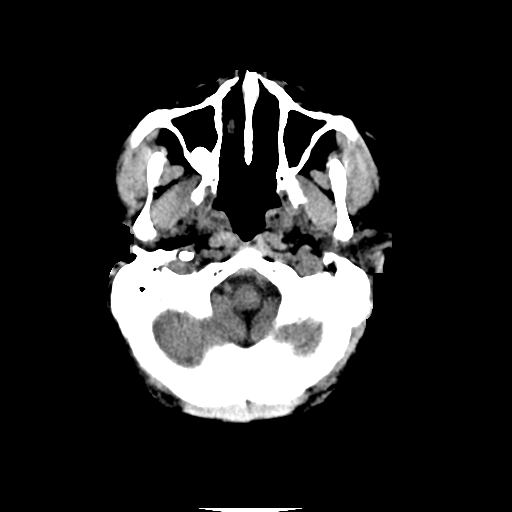
[im 3/30  bone]
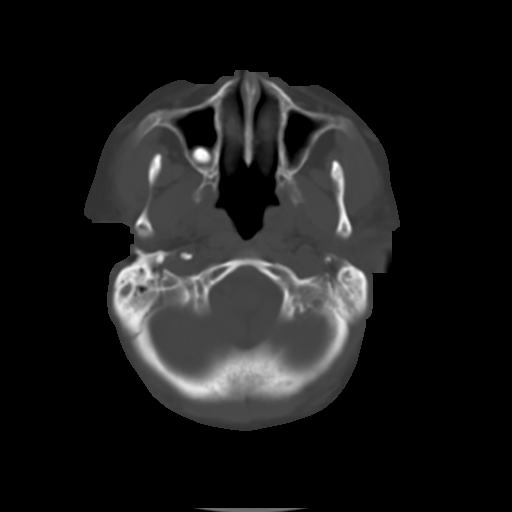
[im 5/30  brain]
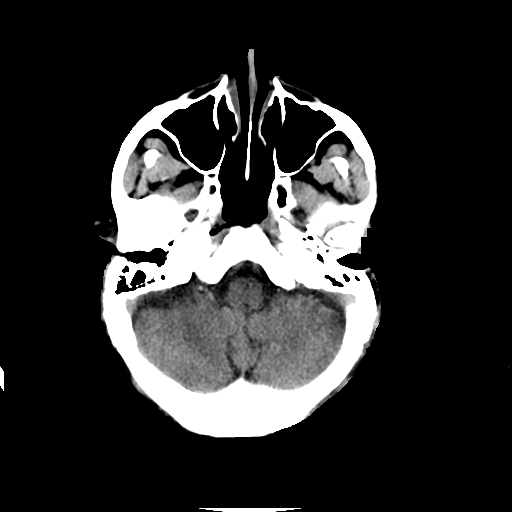
[im 7/30  brain]
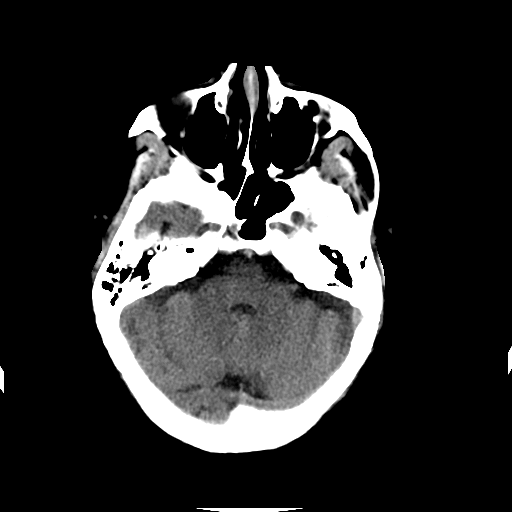
[im 9/30  brain]
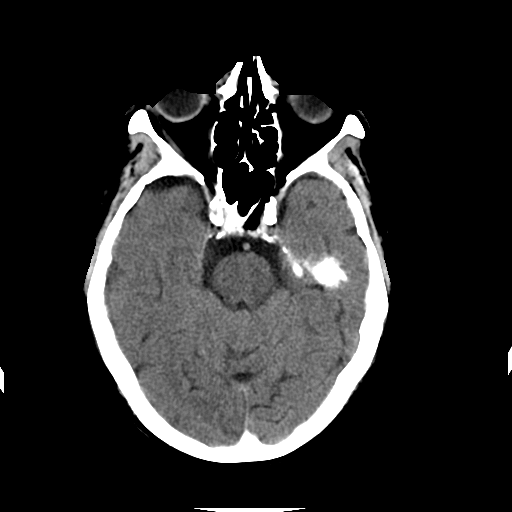
[im 11/30  brain]
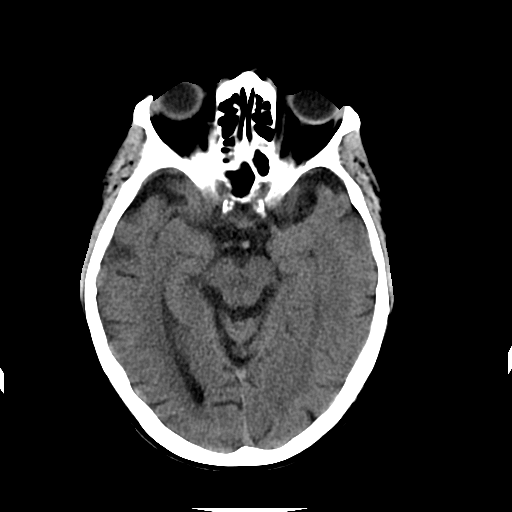
[im 11/30  bone]
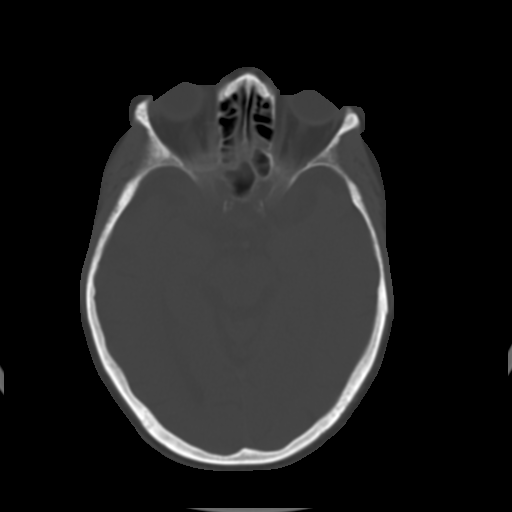
[im 13/30  brain]
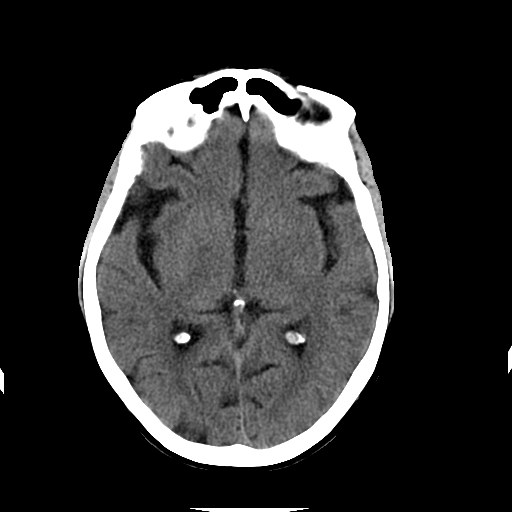
[im 15/30  brain]
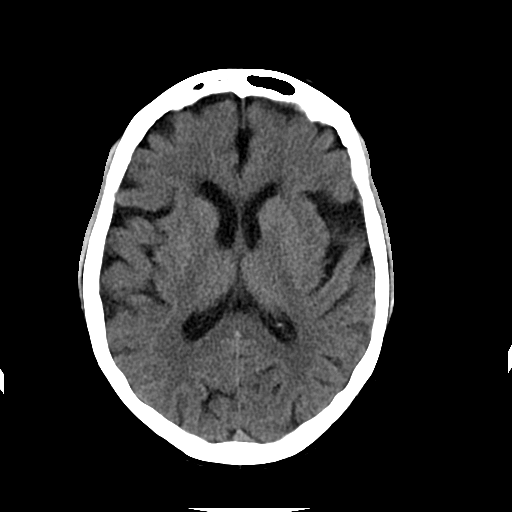
[im 17/30  brain]
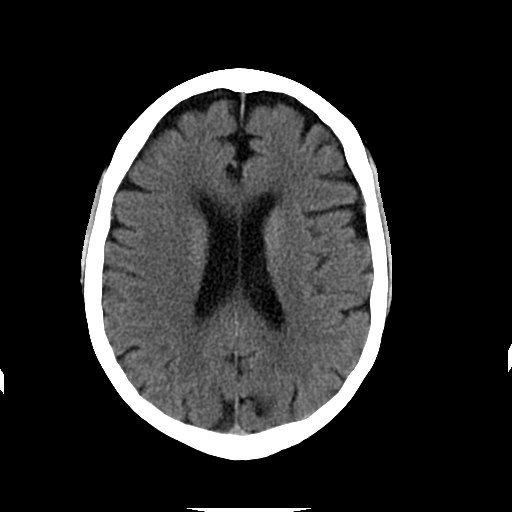
[im 19/30  brain]
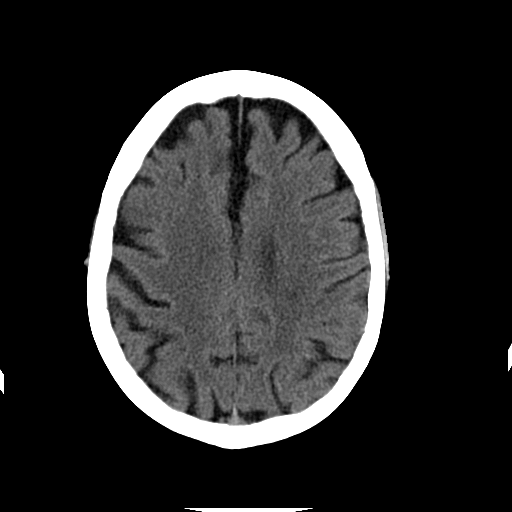
[im 19/30  bone]
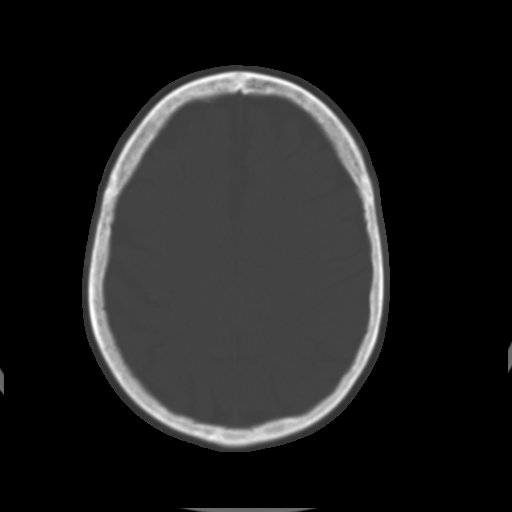
[im 21/30  brain]
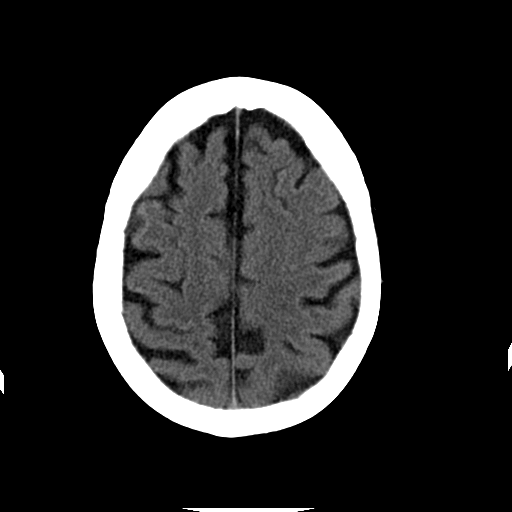
[im 23/30  brain]
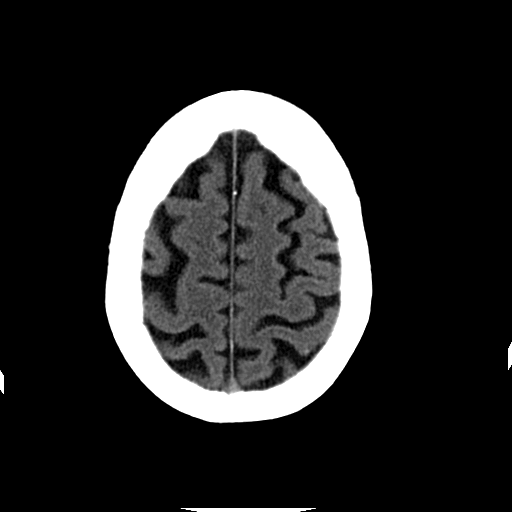
[im 25/30  brain]
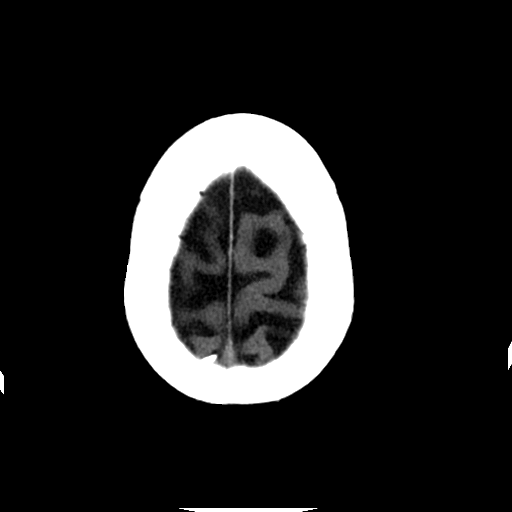
[im 27/30  brain]
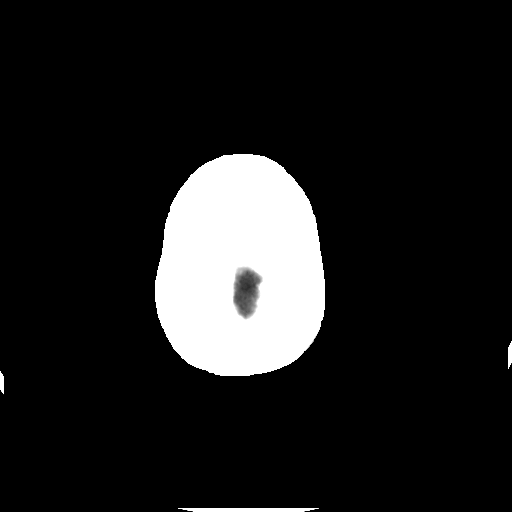
[im 27/30  bone]
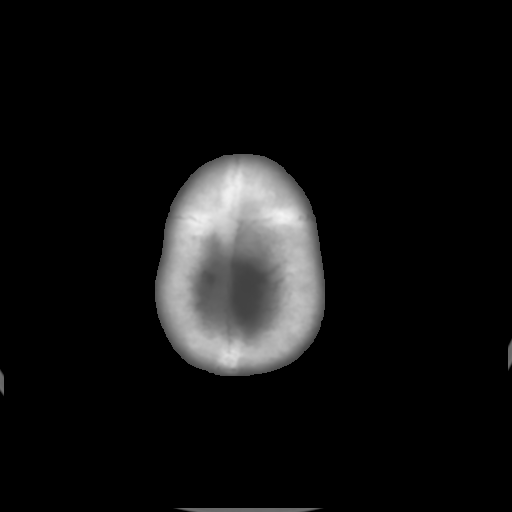

[Series 202: head w/o bone, idose (1) · axial · non-contrast · 0.44mm/px · z∈[+88,+108]mm · 2 of 30 slices shown]
[im 3/30  bone]
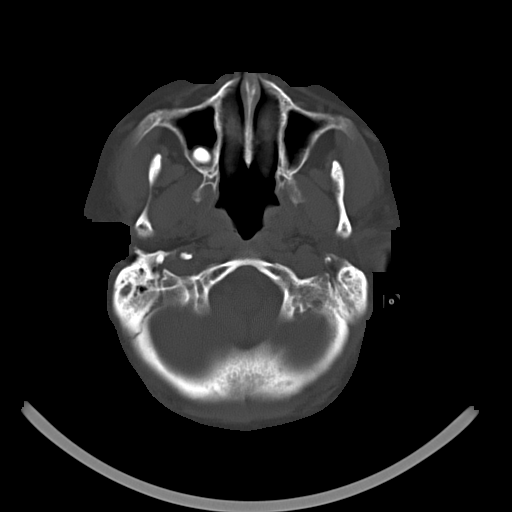
[im 7/30  bone]
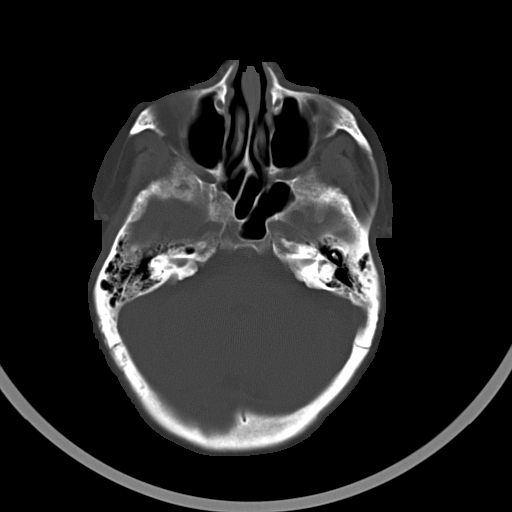

[15 of 30 positions shown; findings below may reference images not displayed]

FINDINGS: There is central and cortical atrophy. There is no intra or
extra-axial fluid collection or mass lesion. The basilar cisterns
and ventricles have a normal appearance. There is no CT evidence for
acute infarction or hemorrhage. Bone windows show mild
mucoperiosteal thickening of the ethmoid air cells. No acute
calvarial injury. Mastoid air cells are normally aerated.
IMPRESSION: 1. Atrophy.
2.  No evidence for acute intracranial abnormality.
3. Mild chronic sinusitis.

Critical Value/emergent results were called by telephone at the time
of interpretation on 07/06/2015 at [DATE] to Dr. Samuka, who
verbally acknowledged these results.

## 2017-07-20 ENCOUNTER — Other Ambulatory Visit: Payer: Self-pay | Admitting: Internal Medicine

## 2017-07-20 DIAGNOSIS — R053 Chronic cough: Secondary | ICD-10-CM

## 2017-07-20 DIAGNOSIS — R05 Cough: Secondary | ICD-10-CM

## 2017-07-26 ENCOUNTER — Ambulatory Visit
Admission: RE | Admit: 2017-07-26 | Discharge: 2017-07-26 | Disposition: A | Discharge status: Home / Self Care | Payer: Medicare Other | Source: Ambulatory Visit | Attending: Internal Medicine | Admitting: Internal Medicine

## 2017-07-26 DIAGNOSIS — R053 Chronic cough: Secondary | ICD-10-CM

## 2017-07-26 DIAGNOSIS — R918 Other nonspecific abnormal finding of lung field: Secondary | ICD-10-CM | POA: Diagnosis not present

## 2017-07-26 DIAGNOSIS — I700 Atherosclerosis of aorta: Secondary | ICD-10-CM | POA: Diagnosis not present

## 2017-07-26 DIAGNOSIS — R05 Cough: Secondary | ICD-10-CM | POA: Diagnosis not present

## 2017-08-10 ENCOUNTER — Encounter: Payer: Self-pay | Admitting: Emergency Medicine

## 2017-08-10 ENCOUNTER — Other Ambulatory Visit: Payer: Self-pay

## 2017-08-10 ENCOUNTER — Telehealth: Payer: Self-pay

## 2017-08-10 ENCOUNTER — Inpatient Hospital Stay
Admission: EM | Admit: 2017-08-10 | Discharge: 2017-08-14 | DRG: 302 | Disposition: A | Discharge status: Home Health | Payer: Medicare Other | Attending: Internal Medicine | Admitting: Internal Medicine

## 2017-08-10 ENCOUNTER — Emergency Department: Payer: Medicare Other

## 2017-08-10 DIAGNOSIS — G40209 Localization-related (focal) (partial) symptomatic epilepsy and epileptic syndromes with complex partial seizures, not intractable, without status epilepticus: Secondary | ICD-10-CM | POA: Diagnosis present

## 2017-08-10 DIAGNOSIS — Z955 Presence of coronary angioplasty implant and graft: Secondary | ICD-10-CM | POA: Diagnosis not present

## 2017-08-10 DIAGNOSIS — R4701 Aphasia: Secondary | ICD-10-CM | POA: Diagnosis not present

## 2017-08-10 DIAGNOSIS — K219 Gastro-esophageal reflux disease without esophagitis: Secondary | ICD-10-CM | POA: Diagnosis present

## 2017-08-10 DIAGNOSIS — Z8249 Family history of ischemic heart disease and other diseases of the circulatory system: Secondary | ICD-10-CM

## 2017-08-10 DIAGNOSIS — Z79899 Other long term (current) drug therapy: Secondary | ICD-10-CM

## 2017-08-10 DIAGNOSIS — R29810 Facial weakness: Secondary | ICD-10-CM | POA: Diagnosis present

## 2017-08-10 DIAGNOSIS — Z23 Encounter for immunization: Secondary | ICD-10-CM

## 2017-08-10 DIAGNOSIS — Z90710 Acquired absence of both cervix and uterus: Secondary | ICD-10-CM

## 2017-08-10 DIAGNOSIS — Z9049 Acquired absence of other specified parts of digestive tract: Secondary | ICD-10-CM

## 2017-08-10 DIAGNOSIS — I200 Unstable angina: Secondary | ICD-10-CM | POA: Diagnosis not present

## 2017-08-10 DIAGNOSIS — I255 Ischemic cardiomyopathy: Secondary | ICD-10-CM | POA: Diagnosis present

## 2017-08-10 DIAGNOSIS — Z833 Family history of diabetes mellitus: Secondary | ICD-10-CM

## 2017-08-10 DIAGNOSIS — Z7982 Long term (current) use of aspirin: Secondary | ICD-10-CM | POA: Diagnosis not present

## 2017-08-10 DIAGNOSIS — Z794 Long term (current) use of insulin: Secondary | ICD-10-CM | POA: Diagnosis not present

## 2017-08-10 DIAGNOSIS — I110 Hypertensive heart disease with heart failure: Secondary | ICD-10-CM | POA: Diagnosis present

## 2017-08-10 DIAGNOSIS — E7800 Pure hypercholesterolemia, unspecified: Secondary | ICD-10-CM | POA: Diagnosis present

## 2017-08-10 DIAGNOSIS — I252 Old myocardial infarction: Secondary | ICD-10-CM | POA: Diagnosis not present

## 2017-08-10 DIAGNOSIS — E785 Hyperlipidemia, unspecified: Secondary | ICD-10-CM | POA: Diagnosis present

## 2017-08-10 DIAGNOSIS — E1151 Type 2 diabetes mellitus with diabetic peripheral angiopathy without gangrene: Secondary | ICD-10-CM | POA: Diagnosis present

## 2017-08-10 DIAGNOSIS — I25110 Atherosclerotic heart disease of native coronary artery with unstable angina pectoris: Secondary | ICD-10-CM | POA: Diagnosis present

## 2017-08-10 DIAGNOSIS — Z823 Family history of stroke: Secondary | ICD-10-CM

## 2017-08-10 DIAGNOSIS — I5032 Chronic diastolic (congestive) heart failure: Secondary | ICD-10-CM | POA: Diagnosis present

## 2017-08-10 DIAGNOSIS — Z66 Do not resuscitate: Secondary | ICD-10-CM | POA: Diagnosis present

## 2017-08-10 DIAGNOSIS — I6381 Other cerebral infarction due to occlusion or stenosis of small artery: Secondary | ICD-10-CM | POA: Diagnosis not present

## 2017-08-10 DIAGNOSIS — Z8673 Personal history of transient ischemic attack (TIA), and cerebral infarction without residual deficits: Secondary | ICD-10-CM

## 2017-08-10 DIAGNOSIS — Z951 Presence of aortocoronary bypass graft: Secondary | ICD-10-CM

## 2017-08-10 DIAGNOSIS — D649 Anemia, unspecified: Secondary | ICD-10-CM | POA: Diagnosis present

## 2017-08-10 DIAGNOSIS — R079 Chest pain, unspecified: Secondary | ICD-10-CM

## 2017-08-10 DIAGNOSIS — Z7902 Long term (current) use of antithrombotics/antiplatelets: Secondary | ICD-10-CM | POA: Diagnosis not present

## 2017-08-10 DIAGNOSIS — E440 Moderate protein-calorie malnutrition: Secondary | ICD-10-CM | POA: Diagnosis present

## 2017-08-10 DIAGNOSIS — R569 Unspecified convulsions: Secondary | ICD-10-CM | POA: Diagnosis not present

## 2017-08-10 DIAGNOSIS — R570 Cardiogenic shock: Secondary | ICD-10-CM | POA: Diagnosis present

## 2017-08-10 DIAGNOSIS — G8384 Todd's paralysis (postepileptic): Secondary | ICD-10-CM | POA: Diagnosis present

## 2017-08-10 DIAGNOSIS — I959 Hypotension, unspecified: Secondary | ICD-10-CM

## 2017-08-10 DIAGNOSIS — Z6820 Body mass index (BMI) 20.0-20.9, adult: Secondary | ICD-10-CM

## 2017-08-10 DIAGNOSIS — R29715 NIHSS score 15: Secondary | ICD-10-CM | POA: Diagnosis not present

## 2017-08-10 DIAGNOSIS — L4050 Arthropathic psoriasis, unspecified: Secondary | ICD-10-CM | POA: Diagnosis present

## 2017-08-10 LAB — CBC
HCT: 31.4 % — ABNORMAL LOW (ref 35.0–47.0)
Hemoglobin: 10.2 g/dL — ABNORMAL LOW (ref 12.0–16.0)
MCH: 26.8 pg (ref 26.0–34.0)
MCHC: 32.5 g/dL (ref 32.0–36.0)
MCV: 82.6 fL (ref 80.0–100.0)
PLATELETS: 180 10*3/uL (ref 150–440)
RBC: 3.8 MIL/uL (ref 3.80–5.20)
RDW: 17.4 % — AB (ref 11.5–14.5)
WBC: 4.4 10*3/uL (ref 3.6–11.0)

## 2017-08-10 LAB — COMPREHENSIVE METABOLIC PANEL
ALT: 12 U/L — ABNORMAL LOW (ref 14–54)
ANION GAP: 7 (ref 5–15)
AST: 24 U/L (ref 15–41)
Albumin: 3.8 g/dL (ref 3.5–5.0)
Alkaline Phosphatase: 50 U/L (ref 38–126)
BUN: 17 mg/dL (ref 6–20)
CHLORIDE: 104 mmol/L (ref 101–111)
CO2: 25 mmol/L (ref 22–32)
Calcium: 9.1 mg/dL (ref 8.9–10.3)
Creatinine, Ser: 0.57 mg/dL (ref 0.44–1.00)
Glucose, Bld: 154 mg/dL — ABNORMAL HIGH (ref 65–99)
POTASSIUM: 4.5 mmol/L (ref 3.5–5.1)
Sodium: 136 mmol/L (ref 135–145)
Total Bilirubin: 0.6 mg/dL (ref 0.3–1.2)
Total Protein: 7.3 g/dL (ref 6.5–8.1)

## 2017-08-10 LAB — APTT: APTT: 29 s (ref 24–36)

## 2017-08-10 LAB — TROPONIN I: Troponin I: <0.03 ng/mL (ref <0.03)

## 2017-08-10 LAB — GLUCOSE, CAPILLARY
GLUCOSE-CAPILLARY: 247 mg/dL — AB (ref 65–99)
GLUCOSE-CAPILLARY: 37 mg/dL — AB (ref 65–99)
Glucose-Capillary: 39 mg/dL — CL (ref 65–99)
Glucose-Capillary: 64 mg/dL — ABNORMAL LOW (ref 65–99)
Glucose-Capillary: 48 mg/dL — ABNORMAL LOW (ref 65–99)

## 2017-08-10 LAB — PROTIME-INR
INR: 1.06
PROTHROMBIN TIME: 13.7 s (ref 11.4–15.2)

## 2017-08-10 LAB — MRSA PCR SCREENING: MRSA BY PCR: NEGATIVE

## 2017-08-10 MED ORDER — VITAMIN B-12 1000 MCG PO TABS
1000.0000 ug | ORAL_TABLET | ORAL | Status: DC
  Administered 2017-08-11 – 2017-08-14 (×4): 1000 ug via ORAL
  Filled 2017-08-10 (×4): qty 1

## 2017-08-10 MED ORDER — INSULIN ASPART 100 UNIT/ML ~~LOC~~ SOLN
0.0000 [IU] | Freq: Three times a day (TID) | SUBCUTANEOUS | Status: DC
  Administered 2017-08-11: 2 [IU] via SUBCUTANEOUS
  Administered 2017-08-11: 1 [IU] via SUBCUTANEOUS
  Administered 2017-08-11 – 2017-08-12 (×2): 2 [IU] via SUBCUTANEOUS
  Administered 2017-08-12 – 2017-08-13 (×3): 3 [IU] via SUBCUTANEOUS
  Administered 2017-08-13: 1 [IU] via SUBCUTANEOUS
  Administered 2017-08-13: 3 [IU] via SUBCUTANEOUS
  Administered 2017-08-14: 7 [IU] via SUBCUTANEOUS
  Administered 2017-08-14: 2 [IU] via SUBCUTANEOUS
  Filled 2017-08-10 (×10): qty 1

## 2017-08-10 MED ORDER — ATORVASTATIN CALCIUM 20 MG PO TABS
40.0000 mg | ORAL_TABLET | Freq: Every day | ORAL | Status: DC
  Administered 2017-08-11 – 2017-08-13 (×2): 40 mg via ORAL
  Filled 2017-08-10 (×2): qty 2

## 2017-08-10 MED ORDER — BRIMONIDINE TARTRATE 0.2 % OP SOLN
1.0000 [drp] | Freq: Two times a day (BID) | OPHTHALMIC | Status: DC
  Filled 2017-08-10: qty 5

## 2017-08-10 MED ORDER — VITAMIN E 180 MG (400 UNIT) PO CAPS
400.0000 [IU] | ORAL_CAPSULE | ORAL | Status: DC
  Administered 2017-08-11 – 2017-08-14 (×4): 400 [IU] via ORAL
  Filled 2017-08-10 (×4): qty 1

## 2017-08-10 MED ORDER — LAMOTRIGINE 25 MG PO TABS
50.0000 mg | ORAL_TABLET | Freq: Two times a day (BID) | ORAL | Status: DC
  Administered 2017-08-11 – 2017-08-12 (×3): 50 mg via ORAL
  Filled 2017-08-10 (×2): qty 1
  Filled 2017-08-10: qty 2

## 2017-08-10 MED ORDER — ACETAMINOPHEN 325 MG PO TABS: 650.0000 mg | ORAL_TABLET | Freq: Four times a day (QID) | ORAL | Status: DC | PRN

## 2017-08-10 MED ORDER — INSULIN GLARGINE 100 UNIT/ML ~~LOC~~ SOLN
24.0000 [IU] | Freq: Every day | SUBCUTANEOUS | Status: DC
  Filled 2017-08-10 (×2): qty 0.24

## 2017-08-10 MED ORDER — ACETAMINOPHEN 650 MG RE SUPP: 650.0000 mg | Freq: Four times a day (QID) | RECTAL | Status: DC | PRN

## 2017-08-10 MED ORDER — LEVETIRACETAM 750 MG PO TABS
750.0000 mg | ORAL_TABLET | Freq: Two times a day (BID) | ORAL | Status: DC
  Administered 2017-08-11 – 2017-08-12 (×3): 750 mg via ORAL
  Filled 2017-08-10 (×5): qty 1

## 2017-08-10 MED ORDER — SODIUM CHLORIDE 0.9 % IV BOLUS (SEPSIS)
1000.0000 mL | Freq: Once | INTRAVENOUS | Status: AC
  Administered 2017-08-10: 1000 mL via INTRAVENOUS

## 2017-08-10 MED ORDER — INSULIN ASPART 100 UNIT/ML ~~LOC~~ SOLN
0.0000 [IU] | Freq: Every day | SUBCUTANEOUS | Status: DC
  Administered 2017-08-13: 3 [IU] via SUBCUTANEOUS
  Filled 2017-08-10: qty 1

## 2017-08-10 MED ORDER — HEPARIN (PORCINE) IN NACL 100-0.45 UNIT/ML-% IJ SOLN
750.0000 [IU]/h | INTRAMUSCULAR | Status: DC
  Administered 2017-08-10: 750 [IU]/h via INTRAVENOUS
  Filled 2017-08-10: qty 250

## 2017-08-10 MED ORDER — CLOPIDOGREL BISULFATE 75 MG PO TABS
75.0000 mg | ORAL_TABLET | ORAL | Status: DC
  Administered 2017-08-11 – 2017-08-14 (×4): 75 mg via ORAL
  Filled 2017-08-10 (×4): qty 1

## 2017-08-10 MED ORDER — HEPARIN BOLUS VIA INFUSION
3500.0000 [IU] | Freq: Once | INTRAVENOUS | Status: AC
  Administered 2017-08-10: 3500 [IU] via INTRAVENOUS
  Filled 2017-08-10: qty 3500

## 2017-08-10 MED ORDER — DOPAMINE-DEXTROSE 3.2-5 MG/ML-% IV SOLN: 0.0000 ug/kg/min | INTRAVENOUS | Status: DC

## 2017-08-10 MED ORDER — FAMOTIDINE 20 MG PO TABS
20.0000 mg | ORAL_TABLET | Freq: Every day | ORAL | Status: DC
  Administered 2017-08-11: 20 mg via ORAL
  Filled 2017-08-10: qty 1

## 2017-08-10 MED ORDER — SENNOSIDES-DOCUSATE SODIUM 8.6-50 MG PO TABS
1.0000 | ORAL_TABLET | Freq: Two times a day (BID) | ORAL | Status: DC
  Administered 2017-08-13 – 2017-08-14 (×2): 1 via ORAL
  Filled 2017-08-10 (×5): qty 1

## 2017-08-10 MED ORDER — ASPIRIN EC 81 MG PO TBEC
81.0000 mg | DELAYED_RELEASE_TABLET | Freq: Every day | ORAL | Status: DC
Start: 2017-08-10 — End: 2017-08-14
  Administered 2017-08-11 – 2017-08-13 (×2): 81 mg via ORAL
  Filled 2017-08-10 (×2): qty 1

## 2017-08-10 MED ORDER — TIMOLOL MALEATE 0.5 % OP SOLN
1.0000 [drp] | Freq: Two times a day (BID) | OPHTHALMIC | Status: DC
  Filled 2017-08-10: qty 5

## 2017-08-10 MED ORDER — DEXTROSE 50 % IV SOLN
INTRAVENOUS | Status: AC
  Administered 2017-08-10: 50 mL via INTRAVENOUS
  Filled 2017-08-10: qty 50

## 2017-08-10 MED ORDER — ONDANSETRON HCL 4 MG/2ML IJ SOLN: 4.0000 mg | Freq: Four times a day (QID) | INTRAMUSCULAR | Status: DC | PRN

## 2017-08-10 MED ORDER — SODIUM CHLORIDE 0.9 % IV SOLN
INTRAVENOUS | Status: DC
  Administered 2017-08-10: 22:00:00 via INTRAVENOUS

## 2017-08-10 MED ORDER — ONDANSETRON HCL 4 MG PO TABS: 4.0000 mg | ORAL_TABLET | Freq: Four times a day (QID) | ORAL | Status: DC | PRN

## 2017-08-10 NOTE — ED Provider Notes (Signed)
[facility] Emergency Department Provider Note  Time seen: 6:56 PM  I have reviewed the triage vital signs and the nursing notes.   HISTORY  Chief Complaint Chest Pain    HPI Leah Small is a 76 y.o. female with a past medical history of gastric reflux, arthritis, hypertension, hyperlipidemia, prior MI, multiple stents, CABG, seizure disorder, presents to the emergency department for chest pain.  According to the patient around 3:30 PM today she developed central to left-sided chest pain with some left arm radiation.  Patient took nitroglycerin 2 or 12/10 pain she was experiencing.  States some resolution of pain but continued to have moderate discomfort.  Took a second nitroglycerin approximate 1 hour prior to arrival, patient continued to have pain so she called EMS who transported the patient to the emergency department.  Upon arrival patient states 5/10 central to left-sided chest pain/pressure.  Denies any nausea, vomiting, diaphoresis at any point today.  Does state mild shortness of breath currently.  Son states the patient has had a recent upper respiratory infection, treated for pneumonia approximately 1-2 weeks ago continues to have a mild cough.  No leg pain or swelling.   Past Medical History:  Diagnosis Date  . Acid reflux   . Arthritis    "all over"  . Chronic stable angina (HCC)   . Coronary artery disease   . Hypercholesterolemia   . Hypertension   . Myocardial infarction (HCC) 1991; 07/06/2015  . NSTEMI (non-ST elevated myocardial infarction) (HCC) 10/22/2015  . Psoriatic arthritis (HCC)   . 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 TIA (transient ischemic attack)    "several over the last 20 years" (10/22/2015)  . Type II diabetes mellitus [facility])     Patient Active Problem List   Diagnosis Date Noted  . Hypotension   . Unstable angina (HCC) 06/26/2017  . Carotid stenosis  11/12/2016  . Hypertensive heart disease 01/29/2016  . Hypertensive urgency 01/29/2016  . Normocytic anemia 01/29/2016  . History of vaginal bleeding 01/29/2016  . GERD (gastroesophageal reflux disease) 01/29/2016  . NSTEMI (non-ST elevated myocardial infarction) (HCC) 10/22/2015  . Non-STEMI (non-ST elevated myocardial infarction) (HCC) 10/22/2015  . 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 Coronary atherosclerosis of native coronary artery 10/22/2015  . Angina pectoris (HCC)   . Memory deficit   . Confusional state   . Sepsis (HCC) 07/06/2015  . Stroke (HCC) 07/06/2015  . Diabetes mellitus with complication (HCC) 07/06/2015  . Essential hypertension 07/06/2015  . Hyperlipidemia 07/06/2015  . Unstable angina pectoris (HCC) 07/06/2015  . Left-sided weakness   . Stroke-like symptoms     Past Surgical History:  Procedure Laterality Date  . ABDOMINAL HYSTERECTOMY  1973  . APPENDECTOMY  1950s   elementary school  . CARDIAC CATHETERIZATION  X 2-3  . CARDIAC CATHETERIZATION N/A 11/21/2015   Procedure: Left Heart Cath and Coronary Angiography;  Surgeon: Corky Crafts, MD;  Location: [facility] INVASIVE CV LAB;  Service: Cardiovascular;  Laterality: N/A;  . CARDIAC CATHETERIZATION  11/21/2015   Procedure: Coronary Stent Intervention;  Surgeon: Corky Crafts, MD;  Location: [facility] INVASIVE CV LAB;  Service: Cardiovascular;;  . CARDIAC CATHETERIZATION N/A 01/29/2016   Procedure: Left Heart Cath and Cors/Grafts Angiography;  Surgeon: Tonny Bollman, MD;  Location: [facility] INVASIVE CV LAB;  Service: Cardiovascular;  Laterality: N/A;  . CARDIAC CATHETERIZATION N/A 07/22/2016   Procedure: Left Heart Cath and  Coronary Angiography;  Surgeon: Iran Ouch, MD;  Location: [facility] INVASIVE CV LAB;  Service: Cardiovascular;  Laterality: N/A;  . CAROTID STENT INSERTION Bilateral 2011-2012   right-left  . CATARACT EXTRACTION W/ INTRAOCULAR LENS  IMPLANT, BILATERAL Bilateral 2009-2010  .  CHOLECYSTECTOMY OPEN  ~ 2014  . CORONARY ANGIOPLASTY    . CORONARY ANGIOPLASTY WITH STENT PLACEMENT  11/21/2015  . CORONARY ARTERY BYPASS GRAFT  08/1995  . DILATION AND CURETTAGE OF UTERUS  X 1  . 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 TONSILLECTOMY  ~ 1947   2nd grade    Prior to Admission medications   Medication Sig Start Date End Date Taking? Authorizing Provider  Adalimumab (HUMIRA PEN [place]) Inject 1 application into the skin See admin instructions. Patient takes it twice monthly.The next one is due June 26th    [provider]  aspirin EC 81 MG tablet Take 81 mg by mouth at bedtime.    [provider]  atorvastatin (LIPITOR) 80 MG tablet Take 1 tablet (80 mg total) by mouth at bedtime. Patient taking differently: Take 40 mg by mouth at bedtime.  11/22/15   Rai, Ripudeep K, MD  brimonidine (ALPHAGAN) 0.2 % ophthalmic solution Place 1 drop into both eyes 2 (two) times daily.     [provider]  clopidogrel (PLAVIX) 75 MG tablet Take 75 mg by mouth every morning.     [provider]  glipiZIDE (GLUCOTROL XL) 10 MG 24 hr tablet Take 10 mg by mouth every morning. 04/26/15   [provider]  guaiFENesin-dextromethorphan (ROBITUSSIN DM) 100-10 MG/5ML syrup Take 5 mLs every 4 (four) hours as needed by mouth for cough. 06/29/17   Enedina Finner, MD  Insulin Glargine (LANTUS SOLOSTAR) 100 UNIT/ML Solostar Pen Inject 24 Units into the skin at bedtime.    [provider]  insulin lispro (HUMALOG) 100 UNIT/ML injection Inject 6-8 Units into the skin 3 (three) times daily before meals. Per sliding scale: 6 units plus:   CBG 150-200 add 1 unit, 201-250 add 2 units, 251-300 add 3 units, 301-350 add 4 units 351-400 add 5 units, >400 add 6 units and call MD    [provider]  isosorbide mononitrate (IMDUR) 60 MG 24 hr tablet Take 1 tablet (60 mg total) daily by mouth. 06/29/17   Enedina Finner, MD   lamoTRIgine (LAMICTAL) 25 MG tablet Take 50 mg by mouth 2 (two) times daily. 06/14/17   [provider]  levETIRAcetam (KEPPRA) 750 MG tablet Take 750 mg by mouth 2 (two) times daily.    [provider]  metFORMIN (GLUCOPHAGE) 1000 MG tablet Take 1 tablet (1,000 mg total) by mouth 2 (two) times daily. 01/30/16   Creig Hines, NP  metoprolol tartrate (LOPRESSOR) 25 MG tablet Take 1 tablet (25 mg total) by mouth at bedtime. 07/23/16   Little Ishikawa, NP  nitroGLYCERIN (NITRODUR - DOSED IN MG/24 HR) 0.4 mg/hr patch Place 0.4 mg onto the skin daily as needed (for chest pain).  02/19/16   [provider]  nitroGLYCERIN (NITROSTAT) 0.4 MG SL tablet Place 0.4 mg under the tongue every 5 (five) minutes as needed for chest pain.    [provider]  nystatin cream (MYCOSTATIN) Apply 1 application topically 2 (two) times daily as needed (yeast infection).  04/25/15   [provider]  ranitidine (ZANTAC) 150 MG tablet Take 150 mg by mouth every morning.  [provider]  senna-docusate (SENOKOT-S) 8.6-50 MG tablet Take 1 tablet 2 (two) times daily by mouth. 06/29/17   Lewie Loronukov, Magadalene S, NP  timolol (TIMOPTIC) 0.5 % ophthalmic solution Place 1 drop into both eyes 2 (two) times daily. 06/14/17   [provider]  triamcinolone ointment (KENALOG) 0.1 % Apply 1 application topically 3 (three) times daily as needed (itching from psoriasis).    [provider]  vitamin B-12 (CYANOCOBALAMIN) 1000 MCG tablet Take 1,000 mcg by mouth every morning.     [provider]  vitamin E 400 UNIT capsule Take 400 Units by mouth every morning.     [provider]    Allergies  Allergen Reactions  . Talwin [Pentazocine] Anaphylaxis and Swelling    Throat swelling  . Valium [Diazepam] Other (See Comments)    Makes her feel like she's flying  . Vicodin [Hydrocodone-Acetaminophen] Nausea And Vomiting  . Adhesive [Tape] Other (See  Comments)    Bruising and TEARING!!!  Please use "paper" tape  . Beta Adrenergic Blockers Other (See Comments)    Pt states she not allergic  . Levofloxacin Swelling and Other (See Comments)    Tongue swells   . Simvastatin Swelling and Rash    Mouth swelling    Family History  Problem Relation Age of Onset  . Heart attack Father   . Diabetes Mother   . Cancer Mother   . Stroke Mother   . Breast cancer Cousin   . Breast cancer Cousin   . Breast cancer Cousin     Social History Social History   Tobacco Use  . Smoking status: Never Smoker  . Smokeless tobacco: Never Used  Substance Use Topics  . Alcohol use: No  . Drug use: No    Review of Systems Constitutional: Negative for fever. Cardiovascular: Positive for chest pain, improved but remains moderate 5/10. Respiratory: Mild shortness of breath Gastrointestinal: Negative for abdominal pain, vomiting.  Denies nausea. Musculoskeletal: Negative for leg pain or swelling Neurological: Negative for headache All other ROS negative  ____________________________________________   PHYSICAL EXAM:  VITAL SIGNS: ED Triage Vitals  Enc Vitals Group     BP 08/10/17 1830 (!) 130/113     Pulse Rate 08/10/17 1830 74     Resp 08/10/17 1830 (!) 22     Temp 08/10/17 1830 (!) 97.4 F (36.3 C)     Temp Source 08/10/17 1830 Oral     SpO2 08/10/17 1830 98 %     Weight 08/10/17 1831 135 lb (61.2 kg)     Height 08/10/17 1831 5\' 8"  (1.727 m)     Head Circumference --      Peak Flow --      Pain Score 08/10/17 1830 5     Pain Loc --      Pain Edu? --      Excl. in GC? --     Constitutional: Alert and oriented. Well appearing and in no distress. Eyes: Normal exam ENT   Head: Normocephalic and atraumatic.   Mouth/Throat: Mucous membranes are moist. Cardiovascular: Normal rate, regular rhythm.  Respiratory: Normal respiratory effort without tachypnea nor retractions. Breath sounds are clear Gastrointestinal: Soft and  nontender. No distention.  Musculoskeletal: Nontender with normal range of motion in all extremities. No lower extremity tenderness or edema. Neurologic:  Normal speech and language. No gross focal neurologic deficits Skin:  Skin is warm, dry and intact.  Psychiatric: Mood and affect are normal.   ____________________________________________  EKG  EKG reviewed and interpreted by myself shows normal sinus rhythm at 74 bpm, narrow QRS, normal axis, normal intervals, nonspecific ST changes including mild inferolateral mild ST depressions.  Reviewing the patient's old EKGs the lateral ST depressions do appear to be somewhat new for the patient.  ____________________________________________    RADIOLOGY  X-ray negative  ____________________________________________   INITIAL IMPRESSION / ASSESSMENT AND PLAN / ED COURSE  Pertinent labs & imaging results that were available during my care of the patient were reviewed by me and considered in my medical decision making (see chart for details).  Patient presents to the emergency department with chest pain.  Differential would include ACS, pneumonia, pneumothorax, chest wall pain, angina.  We will check labs including cardiac enzymes, chest x-ray and EKG.  Patient does have a significant cardiac history including a CABG, multiple stents including one placed last year.  Patient states moderate chest pain currently 5/10 although she appears calm, cooperative, no distress, playful and interactive.  X-rays negative.  Patient's labs are resulted largely within normal limits including a negative troponin.  However the patient has become quite hypotensive currently in the 60s systolic.  Receiving IV fluids.  Patient will require admission to the hospital.  Repeat EKG is largely unchanged from initial EKG.  His blood pressure has increased approximately 80 systolic with IV fluids.  We will continue with fluids patient will be admitted to the hospital.   Patient does have T wave inversions now on EKG in the inferolateral leads with mild ST depression in these leads as well.  This does appear to be new from the prior EKG.  We will start the patient on heparin and admit to the hospitalist service for further treatment.  Patient last received nitroglycerin at 5:30 PM.  At no time did the patient received nitroglycerin ointment or IV nitroglycerin.    ____________________________________________   FINAL CLINICAL IMPRESSION(S) / ED DIAGNOSES  Chest pain    Minna AntisPaduchowski, Agapita Savarino, MD 08/10/17 [phone]

## 2017-08-10 NOTE — Telephone Encounter (Signed)
Attempted to call patient Received referral from [facility]  Needs referral for Abnormal CT of chest Will place referral in referral file  Will try again at a later time

## 2017-08-10 NOTE — ED Notes (Signed)
Pt wanting to sit up and is talking to family and RN more easily. Pt does not appear to be as drowsy as she did an hour ago. Pt remains alert and oriented x4.

## 2017-08-10 NOTE — Progress Notes (Signed)
eLink Physician-Brief Progress Note Patient Name: Leah FishmanDonnie R Neace DOB: 30-Nov-1940 MRN: [id number]   Date of Service  08/10/2017  HPI/Events of Note  76 yo female with PMH of CAD - s/p CABG and PCI. Presents with CP with radiation to jaw associated with hypotension. LVEF = 35% so cautiously getting fluid. EKG with inferolateral ST segment depression. Cardiology being consulted. PCCM asked to assume care in the ICU. VSS.   eICU Interventions  No new orders.      Intervention Category Evaluation Type: New Patient Evaluation  Lenell AntuSommer,Karlyn Glasco [place] 08/10/2017, 9:43 PM

## 2017-08-10 NOTE — Consult Note (Signed)
Name: Leah Small MRN: [id number] DOB: [birth year]    ADMISSION DATE:  08/10/2017  CONSULTATION DATE:  08/10/17  REFERRING MD :  Dr. Nemiah Commander  CHIEF COMPLAINT:  Chest pain  BRIEF PATIENT DESCRIPTION: 76 year old female with multiple comorbidities and significant cardiac history with CAD status post CABG and PCI.  Presenting with an STEMI and hypotension.  LVEF = 50-55%.  Cardiology consulted.  SIGNIFICANT EVENTS  12/18>> patient presented to [facility] with NSTEMI>> admitted to the ICU for closer monitoring.  STUDIES:  06/26/17 ECHO>>The cavity size was normal. Systolic function was   normal. The estimated ejection fraction was in the range of 50%   to 55%.  11/29 Cardiac cath>>The left ventricular ejection fraction is 35-45% by visual estimate.LV end diastolic pressure is mildly elevated.There is moderate left ventricular systolic dysfunction   HISTORY OF PRESENT ILLNESS: Leah Small is a 76 year old female with multiple comorbidities including which includes GERD, diabetes mellitus, complex partial seizures, hypertension, psoriatic arthritis CAD status post CABG, stent placement in March 2017 and multiple angina .    Patient presented with chest pain in November and had cardiac catheterization that showed significant three-vessel disease which was medically managed.  Apparently patient was doing well up until 3 PM today where she  started to have chest pain which was radiating to her jaw. She took nitroglycerine tablet.   She had some relief however she continued to have chest pain therefore her son decided to call EMS.  Patient was initially hypertensive with systolic blood pressure in 200s.  However the patient became hypotensive.  She was given 2 L of fluid  . Her EKG was concerning for NSTEMI and was a change from prior EKG. Initial troponins were negative.  Patient was started on heparin.  Cardiology was consulted.  Patient was transferred to the ICU for closer  monitoring.    However PAST MEDICAL HISTORY :   has a past medical history of Acid reflux, Arthritis, Chronic stable angina (HCC), Coronary artery disease, Hypercholesterolemia, Hypertension, Myocardial infarction (HCC) (1991; 07/06/2015), NSTEMI (non-ST elevated myocardial infarction) (HCC) (10/22/2015), Psoriatic arthritis (HCC), Seizures (HCC), TIA (transient ischemic attack), and Type II diabetes mellitus (HCC).  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 Prior to Admission medications   Medication Sig Start Date End Date Taking? Authorizing Provider  ranolazine (RANEXA) 500 MG 12 hr tablet Take 1 tablet by mouth 2 (two) times daily. 03/26/14 07/05/18 Yes [provider]  Adalimumab (HUMIRA PEN [place]) Inject 1 application into the skin See admin instructions. Patient takes it twice monthly.The next one is due June 26th    [provider]  aspirin EC 81 MG tablet Take 81 mg by mouth at bedtime.    [provider]  atorvastatin (LIPITOR) 80 MG tablet Take 1 tablet (80 mg total) by mouth at bedtime. Patient taking differently: Take 40 mg by mouth at bedtime.  11/22/15   Rai, Ripudeep K, MD  brimonidine (ALPHAGAN) 0.2 % ophthalmic solution Place 1 drop into both eyes 2 (two) times daily.     [provider]  clopidogrel (PLAVIX) 75 MG tablet Take 75 mg by  mouth every morning.     [provider]  doxycycline (VIBRAMYCIN) 100 MG capsule Take 1 capsule by mouth 2 (two) times daily. 07/29/17   [provider]  glipiZIDE (GLUCOTROL XL) 10 MG 24 hr tablet Take 10 mg by mouth every morning. 04/26/15   [provider]  guaiFENesin-dextromethorphan (ROBITUSSIN DM) 100-10 MG/5ML syrup Take 5 mLs every 4 (four) hours as needed by mouth for cough. 06/29/17   Enedina FinnerPatel, Sona, MD  Insulin Glargine (LANTUS SOLOSTAR) 100 UNIT/ML Solostar Pen Inject 24 Units into the skin at bedtime.    [provider]  insulin lispro (HUMALOG) 100 UNIT/ML injection Inject 6-8 Units into the skin 3 (three) times daily before meals. Per sliding scale: 6 units plus:   CBG 150-200 add 1 unit, 201-250 add 2 units, 251-300 add 3 units, 301-350 add 4 units 351-400 add 5 units, >400 add 6 units and call MD    [provider]  isosorbide mononitrate (IMDUR) 60 MG 24 hr tablet Take 1 tablet (60 mg total) daily by mouth. 06/29/17   Enedina FinnerPatel, Sona, MD  lamoTRIgine (LAMICTAL) 25 MG tablet Take 50 mg by mouth 2 (two) times daily. 06/14/17   [provider]  levETIRAcetam (KEPPRA) 750 MG tablet Take 750 mg by mouth 2 (two) times daily.    [provider]  metFORMIN (GLUCOPHAGE) 1000 MG tablet Take 1 tablet (1,000 mg total) by mouth 2 (two) times daily. 01/30/16   Creig HinesBerge, Christopher Ronald, NP  metoprolol tartrate (LOPRESSOR) 25 MG tablet Take 1 tablet (25 mg total) by mouth at bedtime. 07/23/16   Little IshikawaSmith, Erin E, NP  nitroGLYCERIN (NITRODUR - DOSED IN MG/24 HR) 0.4 mg/hr patch Place 0.4 mg onto the skin daily as needed (for chest pain).  02/19/16   [provider]  nitroGLYCERIN (NITROSTAT) 0.4 MG SL tablet Place 0.4 mg under the tongue every 5 (five) minutes as needed for chest pain.    [provider]  nystatin cream (MYCOSTATIN) Apply 1 application topically 2 (two) times daily as needed (yeast infection).  04/25/15   [provider]  ranitidine (ZANTAC) 150 MG tablet Take 150 mg by mouth every morning.     [provider]  senna-docusate (SENOKOT-S) 8.6-50 MG tablet Take 1 tablet 2 (two) times daily by mouth. 06/29/17   Lewie Loronukov, Magadalene S, NP  timolol (TIMOPTIC) 0.5 % ophthalmic solution Place 1 drop into both eyes 2 (two) times daily. 06/14/17   [provider]  triamcinolone ointment (KENALOG) 0.1 % Apply 1 application topically 3 (three) times daily as needed (itching from psoriasis).    [provider]  vitamin B-12 (CYANOCOBALAMIN) 1000 MCG tablet Take 1,000 mcg by mouth every morning.     [provider]  vitamin E 400 UNIT capsule Take 400 Units by mouth every morning.     [provider]   Allergies  Allergen Reactions  . Talwin [Pentazocine] Anaphylaxis and Swelling    Throat swelling  . Valium [Diazepam] Other (See Comments)    Makes her feel like she's flying  . Vicodin [Hydrocodone-Acetaminophen] Nausea And Vomiting  . Adhesive [Tape] Other (See Comments)    Bruising and TEARING!!!  Please use "paper" tape  . Beta Adrenergic Blockers Other (See Comments)    Pt states she not allergic  . Levofloxacin Swelling and Other (See Comments)    Tongue swells   . Simvastatin Swelling and Rash    Mouth  swelling    FAMILY HISTORY:  family history includes Breast cancer in her cousin, cousin, and cousin; Cancer in her mother; Diabetes in her mother; Heart attack in her father; Stroke in her mother. SOCIAL HISTORY:  reports that  has never smoked. she has never used smokeless tobacco. She reports that she does not drink alcohol or use drugs.  SUBJECTIVE: Patient states that "her pain is better.  It is 4/10"  VITAL SIGNS: Temp:  [97.4 F (36.3 C)] 97.4 F (36.3 C) (12/18 1830) Pulse Rate:  [57-74] 59 (12/18 2100) Resp:  [14-24] 21 (12/18 2100) BP: (69-130)/(33-113) 95/46 (12/18 2100) SpO2:  [98 %-100 %] 100 % (12/18 2100) Weight:  [135 lb (61.2  kg)] 135 lb (61.2 kg) (12/18 1831)  PHYSICAL EXAMINATION: General:  Elderly,Caucasian female, in no acute distress Neuro: Awake,alert and oriented HEENT:  AT,[place],No jvd Cardiovascular: s1s2,regular,no m/r/g Lungs:  Clear bilaterally, no wheezes ,crackles and rhonchi Abdomen: soft,nt,nd Musculoskeletal:  No edema,cyanosis noted Skin:  Warm,dry and intact  Recent Labs  Lab 08/10/17 1829  NA 136  K 4.5  CL 104  CO2 25  BUN 17  CREATININE 0.57  GLUCOSE 154*   Recent Labs  Lab 08/10/17 1829  HGB 10.2*  HCT 31.4*  WBC 4.4  PLT 180   Dg Chest 2 View  Result Date: 08/10/2017 CLINICAL DATA:  Chest pain EXAM: CHEST  2 VIEW COMPARISON:  06/26/2017 FINDINGS: Prior CABG. Heart and mediastinal contours are within normal limits. No focal opacities or effusions. No acute bony abnormality. IMPRESSION: No active cardiopulmonary disease. Electronically Signed   By: Charlett NoseKevin  Dover M.D.   On: 08/10/2017 18:53    ASSESSMENT / PLAN:  NSTEMI Hypotension possibly related to cardiogenic shock Hx of CHF Hx of seizures Hx of hyperlipidemia Hx of Diabetes Melitus   Plan Continue heparin Trend troponins Cardiology consulted Continue aspirin and Plavix Will initiate dopamine if MAP<65 Follow-up echo Continue atorvastatin Blood glucose checks with sliding scale insulin coverage Continue home dose Lamictal/Keppra    Yadir Zentner,AG-ACNP Pulmonary and Critical Care Medicine [facility]   08/10/2017, 9:15 PM

## 2017-08-10 NOTE — H&P (Signed)
[employer] [facility] [facility] [place] at [facility]   PATIENT NAME: Leah Small    MR#:  [id number]  DATE OF BIRTH:  1940-12-26  DATE OF ADMISSION:  08/10/2017  PRIMARY CARE PHYSICIAN: Marguarite Arbour, MD   REQUESTING/REFERRING PHYSICIAN: Dr. Minna Antis  CHIEF COMPLAINT:   Chief Complaint  Patient presents with  . Chest Pain    HISTORY OF PRESENT ILLNESS:  Abriel Hattery  is a 76 y.o. female with a known history of CAD status post CABG several years ago, cardiac stent placement in March 2017, few admissions for unstable angina after that requiring medical management, hypertension, psoriatic arthritis, diabetes mellitus, complex partial seizures presents from home secondary to sudden onset of chest pain. Patient was here last month for chest pain and was ruled out, she was discharged. Her last cardiac catheterization was in November 2017 that showed significant three-vessel disease but medical management was recommended at that time. Patient takes Imdur, metoprolol at home. She took her medications this morning. She was doing fine up until this afternoon around 3:00 when she started feeling weakness, and started having pain in her left shoulder radiating to her jaw. She took 1 nitroglycerin that relieved her pain. She had couple of belching episodes with relief. One hour later pain started back again and she took another nitroglycerin without significant relief. She called her son who works as an Museum/gallery exhibitions officer, who brought her to the emergency room. Her blood pressure initially was high with systolic 200s, she had never had hypotension issues. In the ED blood pressure systolic is in the 90s in spite of 2 L of IV fluids. EKG shows significant ST depressions in the lateral leads V4 to V6 and also inferior leads 2 and 3. Which are new compared to her old EKGs. First troponin is still negative.  PAST MEDICAL HISTORY:   Past Medical History:  Diagnosis Date  . Acid reflux   . Arthritis    "all over"  . Chronic stable angina (HCC)   . Coronary artery disease   . Hypercholesterolemia   . Hypertension   . Myocardial infarction (HCC) 1991; 07/06/2015  . NSTEMI (non-ST elevated myocardial infarction) (HCC) 10/22/2015  . Psoriatic arthritis (HCC)   . 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 TIA (transient ischemic attack)    "several over the last 20 years" (10/22/2015)  . Type II diabetes mellitus (HCC)     PAST SURGICAL HISTORY:   Past Surgical History:  Procedure Laterality Date  . ABDOMINAL HYSTERECTOMY  1973  . APPENDECTOMY  1950s   elementary school  . CARDIAC CATHETERIZATION  X 2-3  . CARDIAC CATHETERIZATION N/A 11/21/2015   Procedure: Left Heart Cath and Coronary Angiography;  Surgeon: Corky Crafts, MD;  Location: [facility] INVASIVE CV LAB;  Service: Cardiovascular;  Laterality: N/A;  . CARDIAC CATHETERIZATION  11/21/2015   Procedure: Coronary Stent Intervention;  Surgeon: Corky Crafts, MD;  Location: [facility] INVASIVE CV LAB;  Service: Cardiovascular;;  . CARDIAC CATHETERIZATION N/A 01/29/2016   Procedure: Left Heart Cath and Cors/Grafts Angiography;  Surgeon: Tonny Bollman, MD;  Location: [facility] INVASIVE CV LAB;  Service: Cardiovascular;  Laterality: N/A;  . CARDIAC CATHETERIZATION N/A 07/22/2016   Procedure: Left Heart Cath and Coronary Angiography;  Surgeon: Iran Ouch, MD;  Location: [facility] INVASIVE CV LAB;  Service: Cardiovascular;  Laterality: N/A;  . CAROTID STENT INSERTION Bilateral 2011-2012   right-left  . CATARACT EXTRACTION W/  INTRAOCULAR LENS  IMPLANT, BILATERAL Bilateral 2009-2010  . CHOLECYSTECTOMY OPEN  ~ 2014  . CORONARY ANGIOPLASTY    . CORONARY ANGIOPLASTY WITH STENT PLACEMENT  11/21/2015  . CORONARY ARTERY BYPASS GRAFT  08/1995  . DILATION AND CURETTAGE OF UTERUS  X 1  . 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 TONSILLECTOMY  ~ 1947    2nd grade    SOCIAL HISTORY:   Social History   Tobacco Use  . Smoking status: Never Smoker  . Smokeless tobacco: Never Used  Substance Use Topics  . Alcohol use: No    FAMILY HISTORY:   Family History  Problem Relation Age of Onset  . Heart attack Father   . Diabetes Mother   . Cancer Mother   . Stroke Mother   . Breast cancer Cousin   . Breast cancer Cousin   . Breast cancer Cousin     DRUG ALLERGIES:   Allergies  Allergen Reactions  . Talwin [Pentazocine] Anaphylaxis and Swelling    Throat swelling  . Valium [Diazepam] Other (See Comments)    Makes her feel like she's flying  . Vicodin [Hydrocodone-Acetaminophen] Nausea And Vomiting  . Adhesive [Tape] Other (See Comments)    Bruising and TEARING!!!  Please use "paper" tape  . Beta Adrenergic Blockers Other (See Comments)    Pt states she not allergic  . Levofloxacin Swelling and Other (See Comments)    Tongue swells   . Simvastatin Swelling and Rash    Mouth swelling    REVIEW OF SYSTEMS:   Review of Systems  Constitutional: Negative for chills, fever, malaise/fatigue and weight loss.  HENT: Negative for ear discharge, ear pain, hearing loss and nosebleeds.   Eyes: Negative for blurred vision, double vision and photophobia.  Respiratory: Positive for shortness of breath. Negative for cough, hemoptysis and wheezing.   Cardiovascular: Positive for chest pain. Negative for palpitations, orthopnea and leg swelling.  Gastrointestinal: Negative for abdominal pain, constipation, diarrhea, heartburn, melena, nausea and vomiting.       Belching  Genitourinary: Negative for dysuria, frequency and urgency.  Musculoskeletal: Negative for myalgias and neck pain.  Skin: Negative for rash.  Neurological: Positive for dizziness. Negative for tingling, tremors, sensory change, speech change, focal weakness and headaches.  Endo/Heme/Allergies: Does not bruise/bleed easily.  Psychiatric/Behavioral: Negative for  depression.    MEDICATIONS AT HOME:   Prior to Admission medications   Medication Sig Start Date End Date Taking? Authorizing Provider  ranolazine (RANEXA) 500 MG 12 hr tablet Take 1 tablet by mouth 2 (two) times daily. 03/26/14 07/05/18 Yes [provider]  Adalimumab (HUMIRA PEN [place]) Inject 1 application into the skin See admin instructions. Patient takes it twice monthly.The next one is due June 26th    [provider]  aspirin EC 81 MG tablet Take 81 mg by mouth at bedtime.    [provider]  atorvastatin (LIPITOR) 80 MG tablet Take 1 tablet (80 mg total) by mouth at bedtime. Patient taking differently: Take 40 mg by mouth at bedtime.  11/22/15   Rai, Ripudeep K, MD  brimonidine (ALPHAGAN) 0.2 % ophthalmic solution Place 1 drop into both eyes 2 (two) times daily.     [provider]  clopidogrel (PLAVIX) 75 MG tablet Take 75 mg by mouth every morning.     [provider]  doxycycline (VIBRAMYCIN) 100 MG capsule Take 1 capsule by mouth 2 (two) times daily. 07/29/17   [provider]  glipiZIDE (GLUCOTROL XL) 10 MG 24 hr tablet Take 10 mg by mouth every morning. 04/26/15   [provider]  guaiFENesin-dextromethorphan (ROBITUSSIN DM) 100-10 MG/5ML syrup Take 5 mLs every 4 (four) hours as needed by mouth for cough. 06/29/17   Enedina FinnerPatel, Sona, MD  Insulin Glargine (LANTUS SOLOSTAR) 100 UNIT/ML Solostar Pen Inject 24 Units into the skin at bedtime.    [provider]  insulin lispro (HUMALOG) 100 UNIT/ML injection Inject 6-8 Units into the skin 3 (three) times daily before meals. Per sliding scale: 6 units plus:   CBG 150-200 add 1 unit, 201-250 add 2 units, 251-300 add 3 units, 301-350 add 4 units 351-400 add 5 units, >400 add 6 units and call MD    [provider]  isosorbide mononitrate (IMDUR) 60 MG 24 hr tablet Take 1 tablet (60 mg total) daily by mouth. 06/29/17   Enedina FinnerPatel, Sona, MD  lamoTRIgine (LAMICTAL) 25 MG tablet Take 50  mg by mouth 2 (two) times daily. 06/14/17   [provider]  levETIRAcetam (KEPPRA) 750 MG tablet Take 750 mg by mouth 2 (two) times daily.    [provider]  metFORMIN (GLUCOPHAGE) 1000 MG tablet Take 1 tablet (1,000 mg total) by mouth 2 (two) times daily. 01/30/16   Creig HinesBerge, Christopher Ronald, NP  metoprolol tartrate (LOPRESSOR) 25 MG tablet Take 1 tablet (25 mg total) by mouth at bedtime. 07/23/16   Little IshikawaSmith, Erin E, NP  nitroGLYCERIN (NITRODUR - DOSED IN MG/24 HR) 0.4 mg/hr patch Place 0.4 mg onto the skin daily as needed (for chest pain).  02/19/16   [provider]  nitroGLYCERIN (NITROSTAT) 0.4 MG SL tablet Place 0.4 mg under the tongue every 5 (five) minutes as needed for chest pain.    [provider]  nystatin cream (MYCOSTATIN) Apply 1 application topically 2 (two) times daily as needed (yeast infection).  04/25/15   [provider]  ranitidine (ZANTAC) 150 MG tablet Take 150 mg by mouth every morning.     [provider]  senna-docusate (SENOKOT-S) 8.6-50 MG tablet Take 1 tablet 2 (two) times daily by mouth. 06/29/17   Lewie Loronukov, Magadalene S, NP  timolol (TIMOPTIC) 0.5 % ophthalmic solution Place 1 drop into both eyes 2 (two) times daily. 06/14/17   [provider]  triamcinolone ointment (KENALOG) 0.1 % Apply 1 application topically 3 (three) times daily as needed (itching from psoriasis).    [provider]  vitamin B-12 (CYANOCOBALAMIN) 1000 MCG tablet Take 1,000 mcg by mouth every morning.     [provider]  vitamin E 400 UNIT capsule Take 400 Units by mouth every morning.     [provider]      VITAL SIGNS:  Blood pressure (!) 92/50, pulse (!) 57, temperature (!) 97.4 F (36.3 C), temperature source Oral, resp. rate 18, height 5\' 8"  (1.727 m), weight 61.2 kg (135 lb), SpO2 100 %.  PHYSICAL EXAMINATION:   Physical Exam  GENERAL:  76 y.o.-year-old patient lying in the bed with no acute distress.  Pale appearing EYES: Pupils equal, round, reactive to light and accommodation. No scleral icterus. Extraocular muscles intact.  HEENT: Head atraumatic, normocephalic. Oropharynx and nasopharynx clear.  NECK:  Supple, no jugular venous distention. No thyroid enlargement, no tenderness.  LUNGS: Normal breath sounds bilaterally, no wheezing, rales,rhonchi or crepitation. No use of accessory muscles of respiration. Decreased bibasilar breath sounds CARDIOVASCULAR: S1, S2 normal. No rubs, or gallops. 2/6 systolic murmur is present ABDOMEN: Soft,  nontender, nondistended. Bowel sounds present. No organomegaly or mass.  EXTREMITIES: No pedal edema, cyanosis, or clubbing.  NEUROLOGIC: Cranial nerves II through XII are intact. Muscle strength 5/5 in all extremities. Sensation intact. Gait not checked.  PSYCHIATRIC: The patient is alert and oriented x 3.  SKIN: No obvious rash, lesion, or ulcer.   LABORATORY PANEL:   CBC Recent Labs  Lab 08/10/17 1829  WBC 4.4  HGB 10.2*  HCT 31.4*  PLT 180   ------------------------------------------------------------------------------------------------------------------  Chemistries  Recent Labs  Lab 08/10/17 1829  NA 136  K 4.5  CL 104  CO2 25  GLUCOSE 154*  BUN 17  CREATININE 0.57  CALCIUM 9.1  AST 24  ALT 12*  ALKPHOS 50  BILITOT 0.6   ------------------------------------------------------------------------------------------------------------------  Cardiac Enzymes Recent Labs  Lab 08/10/17 1829  TROPONINI <0.03   ------------------------------------------------------------------------------------------------------------------  RADIOLOGY:  Dg Chest 2 View  Result Date: 08/10/2017 CLINICAL DATA:  Chest pain EXAM: CHEST  2 VIEW COMPARISON:  06/26/2017 FINDINGS: Prior CABG. Heart and mediastinal contours are within normal limits. No focal opacities or effusions. No acute bony abnormality. IMPRESSION: No active cardiopulmonary disease.  Electronically Signed   By: Charlett NoseKevin  Dover M.D.   On: 08/10/2017 18:53    EKG:   Orders placed or performed during the hospital encounter of 08/10/17  . ED EKG  . ED EKG  . EKG 12-Lead  . EKG 12-Lead    IMPRESSION AND PLAN:   Lorri FrederickDonnie Tuch  is a 76 y.o. female with a known history of CAD status post CABG several years ago, cardiac stent placement in March 2017, few admissions for unstable angina after that requiring medical management, hypertension, psoriatic arthritis, diabetes mellitus, complex partial seizures presents from home secondary to sudden onset of chest pain.  1. NSTEMI- started on heparin drip, recycle troponins. -Significant known 3 vessel disease. Cardiology consulted. NPO after midnight. -Unable to do nitro drip due to her blood pressure at this time. -Continue aspirin and Plavix. Hold other blood pressure medications.  2. Hypotension-could be cardiogenic shock. Received 2 L fluids already. -Started on IV fluids and monitor carefully due to her known history of CHF. -Started on dopamine drip. Echocardiogram. Monitor in the ICU. - hold BP meds -does not appear to be infected, UA pending - less likely to be PE as not hypoxic or dyspneic- monitor on heparin drip  3. Diabetes mellitus-on Lantus and also sliding scale insulin.  4. 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 Continue Lamictal and Keppra  5. DVT prophylaxis-already on heparin drip   Discussed with Dr. Arsenio LoaderSommer, [facility] and also cardiologist Dr. Darrold JunkerParaschos   All the records are reviewed and case discussed with ED provider. Management plans discussed with the patient, family and they are in agreement.  CODE STATUS: DNR- has a signed DO NOT RESUSCITATE in place  TOTAL CRITICAL CARE  TIME SPENT IN TAKING CARE OF THIS PATIENT: 65 minutes.    Enid BaasKALISETTI,Darlette Dubow M.D on 08/10/2017 at 8:27 PM  Between 7am to 6pm - Pager - [phone]  After 6pm go to [email or web address] - Social research officer, governmentpassword EPAS [facility]  [facility]  [place] [facility]  Office  [phone]  CC: Primary care physician; Marguarite ArbourSparks, Jeffrey D, MD

## 2017-08-10 NOTE — ED Notes (Signed)
Admitting MD at bedside with pt and pts family.  

## 2017-08-10 NOTE — Progress Notes (Signed)
ANTICOAGULATION CONSULT NOTE - Initial Consult  Pharmacy Consult for heparin drip Indication: chest pain/ACS  Allergies  Allergen Reactions  . Talwin [Pentazocine] Anaphylaxis and Swelling    Throat swelling  . Valium [Diazepam] Other (See Comments)    Makes her feel like she's flying  . Vicodin [Hydrocodone-Acetaminophen] Nausea And Vomiting  . Adhesive [Tape] Other (See Comments)    Bruising and TEARING!!!  Please use "paper" tape  . Beta Adrenergic Blockers Other (See Comments)    Pt states she not allergic  . Levofloxacin Swelling and Other (See Comments)    Tongue swells   . Simvastatin Swelling and Rash    Mouth swelling    Patient Measurements: Height: 5\' 8"  (172.7 cm) Weight: 135 lb (61.2 kg) IBW/kg (Calculated) : 63.9 Heparin Dosing Weight: 61 kg  Vital Signs: Temp: 97.4 F (36.3 C) (12/18 1830) Temp Source: Oral (12/18 1830) BP: 79/47 (12/18 1950) Pulse Rate: 57 (12/18 1950)  Labs: Recent Labs    08/10/17 1829  HGB 10.2*  HCT 31.4*  PLT 180  CREATININE 0.57  TROPONINI <0.03    Estimated Creatinine Clearance: 56.9 mL/min (by C-G formula based on SCr of 0.57 mg/dL).   Medical History: Past Medical History:  Diagnosis Date  . Acid reflux   . Arthritis    "all over"  . Chronic stable angina (HCC)   . Coronary artery disease   . Hypercholesterolemia   . Hypertension   . Myocardial infarction (HCC) 1991; 07/06/2015  . NSTEMI (non-ST elevated myocardial infarction) (HCC) 10/22/2015  . Psoriatic arthritis (HCC)   . 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 TIA (transient ischemic attack)    "several over the last 20 years" (10/22/2015)  . Type II diabetes mellitus (HCC)     Assessment: Pharmacy consulted to dose and monitor heparin drip in this 76 year old female for ACS. No anticoagulants prior to admission. Baseline labs ordered.  Goal of Therapy:  Heparin level 0.3-0.7  units/ml Monitor platelets by anticoagulation protocol: Yes   Plan:  Give 3500 units bolus x 1 Start heparin infusion at 750 units/hr Check anti-Xa level in 8 hours and daily while on heparin Continue to monitor H&H and platelets  Cindi CarbonMary M Elleah Hemsley, PharmD, BCPS Clinical Pharmacist 08/10/2017,8:06 PM

## 2017-08-10 NOTE — ED Triage Notes (Signed)
Patient from home via [facility]. Reports chest pain that started around 330pm. Reports taking 2 SL nitro at home with some relief. Patient has history of MI and catheterization with stent placement. Patient reports weakness and some shortness of breath. Alert and oriented upon arrival.

## 2017-08-11 ENCOUNTER — Inpatient Hospital Stay
Admit: 2017-08-11 | Discharge: 2017-08-11 | Disposition: A | Discharge status: Home / Self Care | Payer: Medicare Other | Attending: Pulmonary Disease | Admitting: Pulmonary Disease

## 2017-08-11 LAB — GLUCOSE, CAPILLARY
GLUCOSE-CAPILLARY: 44 mg/dL — AB (ref 65–99)
GLUCOSE-CAPILLARY: 163 mg/dL — AB (ref 65–99)
GLUCOSE-CAPILLARY: 192 mg/dL — AB (ref 65–99)
GLUCOSE-CAPILLARY: 181 mg/dL — AB (ref 65–99)
Glucose-Capillary: 44 mg/dL — CL (ref 65–99)
Glucose-Capillary: 182 mg/dL — ABNORMAL HIGH (ref 65–99)
Glucose-Capillary: 148 mg/dL — ABNORMAL HIGH (ref 65–99)

## 2017-08-11 LAB — BASIC METABOLIC PANEL
ANION GAP: 6 (ref 5–15)
BUN: 12 mg/dL (ref 6–20)
CHLORIDE: 109 mmol/L (ref 101–111)
CO2: 24 mmol/L (ref 22–32)
Calcium: 8.3 mg/dL — ABNORMAL LOW (ref 8.9–10.3)
Creatinine, Ser: 0.56 mg/dL (ref 0.44–1.00)
Glucose, Bld: 211 mg/dL — ABNORMAL HIGH (ref 65–99)
POTASSIUM: 4.6 mmol/L (ref 3.5–5.1)
SODIUM: 139 mmol/L (ref 135–145)

## 2017-08-11 LAB — TROPONIN I
TROPONIN I: 0.1 ng/mL — AB (ref <0.03)
TROPONIN I: 0.1 ng/mL — AB (ref <0.03)

## 2017-08-11 LAB — CBC
HCT: 30.8 % — ABNORMAL LOW (ref 35.0–47.0)
HEMOGLOBIN: 9.9 g/dL — AB (ref 12.0–16.0)
MCH: 27 pg (ref 26.0–34.0)
MCHC: 32.1 g/dL (ref 32.0–36.0)
MCV: 83.9 fL (ref 80.0–100.0)
PLATELETS: 159 10*3/uL (ref 150–440)
RBC: 3.67 MIL/uL — AB (ref 3.80–5.20)
RDW: 17.7 % — ABNORMAL HIGH (ref 11.5–14.5)
WBC: 4.3 10*3/uL (ref 3.6–11.0)

## 2017-08-11 LAB — HEPARIN LEVEL (UNFRACTIONATED): Heparin Unfractionated: 0.3 IU/mL (ref 0.30–0.70)

## 2017-08-11 MED ORDER — NITROGLYCERIN 0.4 MG SL SUBL
SUBLINGUAL_TABLET | SUBLINGUAL | Status: AC
  Filled 2017-08-11: qty 1

## 2017-08-11 MED ORDER — SODIUM CHLORIDE 0.9% FLUSH
3.0000 mL | Freq: Two times a day (BID) | INTRAVENOUS | Status: DC
  Administered 2017-08-11 – 2017-08-14 (×5): 3 mL via INTRAVENOUS

## 2017-08-11 MED ORDER — BRIMONIDINE TARTRATE 0.2 % OP SOLN
1.0000 [drp] | Freq: Two times a day (BID) | OPHTHALMIC | Status: DC
  Administered 2017-08-11: 1 [drp] via OPHTHALMIC
  Filled 2017-08-11: qty 5

## 2017-08-11 MED ORDER — DEXTROSE 50 % IV SOLN
1.0000 | Freq: Once | INTRAVENOUS | Status: AC
  Administered 2017-08-10: 50 mL via INTRAVENOUS

## 2017-08-11 MED ORDER — BRIMONIDINE TARTRATE 0.2 % OP SOLN
1.0000 [drp] | Freq: Two times a day (BID) | OPHTHALMIC | Status: DC
  Administered 2017-08-11 – 2017-08-14 (×6): 1 [drp] via OPHTHALMIC

## 2017-08-11 MED ORDER — INSULIN GLARGINE 100 UNIT/ML ~~LOC~~ SOLN
12.0000 [IU] | Freq: Every day | SUBCUTANEOUS | Status: DC
  Administered 2017-08-11 – 2017-08-13 (×3): 12 [IU] via SUBCUTANEOUS
  Filled 2017-08-11 (×4): qty 0.12

## 2017-08-11 MED ORDER — RANOLAZINE ER 500 MG PO TB12
500.0000 mg | ORAL_TABLET | Freq: Two times a day (BID) | ORAL | Status: DC
  Administered 2017-08-11 – 2017-08-14 (×5): 500 mg via ORAL
  Filled 2017-08-11 (×8): qty 1

## 2017-08-11 MED ORDER — PNEUMOCOCCAL VAC POLYVALENT 25 MCG/0.5ML IJ INJ
0.5000 mL | INJECTION | INTRAMUSCULAR | Status: AC
  Administered 2017-08-12: 0.5 mL via INTRAMUSCULAR
  Filled 2017-08-11: qty 0.5

## 2017-08-11 MED ORDER — NITROGLYCERIN 0.4 MG SL SUBL
0.4000 mg | SUBLINGUAL_TABLET | SUBLINGUAL | Status: DC | PRN
  Administered 2017-08-11: 0.4 mg via SUBLINGUAL

## 2017-08-11 MED ORDER — NITROGLYCERIN IN D5W 200-5 MCG/ML-% IV SOLN
0.0000 ug/min | INTRAVENOUS | Status: DC
  Administered 2017-08-11: 5 ug/min via INTRAVENOUS
  Filled 2017-08-11: qty 250

## 2017-08-11 MED ORDER — TIMOLOL MALEATE 0.5 % OP SOLN
1.0000 [drp] | Freq: Two times a day (BID) | OPHTHALMIC | Status: DC
  Administered 2017-08-11 – 2017-08-14 (×6): 1 [drp] via OPHTHALMIC

## 2017-08-11 MED ORDER — ENOXAPARIN SODIUM 40 MG/0.4ML ~~LOC~~ SOLN
40.0000 mg | SUBCUTANEOUS | Status: DC
  Administered 2017-08-11 – 2017-08-14 (×4): 40 mg via SUBCUTANEOUS
  Filled 2017-08-11 (×4): qty 0.4

## 2017-08-11 MED ORDER — NITROGLYCERIN 2 % TD OINT
1.0000 [in_us] | TOPICAL_OINTMENT | Freq: Four times a day (QID) | TRANSDERMAL | Status: DC
  Administered 2017-08-11 – 2017-08-12 (×4): 1 [in_us] via TOPICAL
  Filled 2017-08-11 (×4): qty 1

## 2017-08-11 NOTE — Progress Notes (Addendum)
[facility]Link Physician-Brief Progress Note Patient Name: Leah FishmanDonnie R Rosetti DOB: July 11, 1941 MRN: [id number]   Date of Service  08/11/2017  HPI/Events of Note  Chest Pain - Relieved earlier with NTG SL. Troponin #1 < 0.03. Troponin #2 = 0.10. Now with recurrent chest pain. BP = 128/59. Patient is already on a Heparin IV infusion and ASA.  eICU Interventions  Will order: 1. NTG 0.4 mg SL PRN chest pain.  2. NTG IV infusion. Titrate to relief of chest pain.      Intervention Category Major Interventions: Other:  Tramel Westbrook Dennard Nipugene 08/11/2017, 1:22 AM

## 2017-08-11 NOTE — Consult Note (Signed)
[facility]  CARDIOLOGY CONSULT NOTE  Patient ID: Leah FishmanDonnie R Golden MRN: [id number] DOB/AGE: 77/08/1940 77 y.o.  Admit date: 08/10/2017 Referring Physician Imogene Burnhen Primary Physician [place] Primary Cardiologist Gwen PoundsKowalski Reason for Consultation unstable angina  HPI: 76 year old female referred for evaluation of unstable angina.  The patient has known complex coronary artery disease.  She is status post [facility].  She has post status post non-STEMI 11/21/2015 at which time cardiac catheterization revealed severe three-vessel coronary artery disease with diffuse distal involvement, patent LIMA to LAD, chronically occluded vein grafts, high-grade stenosis ramus intermedius branch.  The patient underwent drug-eluting stent ramus intermedius branch.  She underwent repeat cardiac catheterization 01/29/2016 which again revealed similar anatomy, with patent stent.  The patient underwent another repeat cardiac catheterization 07/22/2016 which again showed the same anatomy, with patent stent ramus intermedius branch.  Left ventriculography revealed mild to moderate reduced left ventricular function, with LVEF of 35-40%.  According to the patient and son she has had recurrent episodes of chest pain, somewhat refractory to her antianginal regimen.  She did take Ranexa which appeared to help which she discontinued due to cost.  Last evening, the patient recurrent chest pain, took multiple nitroglycerin, [facility] where she had a hypotensive episode, requiring intravenous fluid resuscitation.  During that event EKG revealed sinus rhythm with nonspecific inferolateral ST depression possibly related to an episode of hypotension.  The patient was admitted to the ICU where troponins are 0.10, 0.10.  Patient currently denies chest pain.  Review of systems complete and found to be negative unless listed above     Past Medical History:  Diagnosis Date  . Acid reflux   . Arthritis     "all over"  . Chronic stable angina (HCC)   . Coronary artery disease   . Hypercholesterolemia   . Hypertension   . Myocardial infarction (HCC) 1991; 07/06/2015  . NSTEMI (non-ST elevated myocardial infarction) (HCC) 10/22/2015  . Psoriatic arthritis (HCC)   . 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 TIA (transient ischemic attack)    "several over the last 20 years" (10/22/2015)  . Type II diabetes mellitus (HCC)     Past Surgical History:  Procedure Laterality Date  . ABDOMINAL HYSTERECTOMY  1973  . APPENDECTOMY  1950s   elementary school  . CARDIAC CATHETERIZATION  X 2-3  . CARDIAC CATHETERIZATION N/A 11/21/2015   Procedure: Left Heart Cath and Coronary Angiography;  Surgeon: Corky CraftsJayadeep S Varanasi, MD;  [facility];  Service: Cardiovascular;  Laterality: N/A;  . CARDIAC CATHETERIZATION  11/21/2015   Procedure: Coronary Stent Intervention;  Surgeon: Corky CraftsJayadeep S Varanasi, MD;  [facility];  Service: Cardiovascular;;  . CARDIAC CATHETERIZATION N/A 01/29/2016   Procedure: Left Heart Cath and Cors/Grafts Angiography;  Surgeon: Tonny BollmanMichael Cooper, MD;  [facility];  Service: Cardiovascular;  Laterality: N/A;  . CARDIAC CATHETERIZATION N/A 07/22/2016   Procedure: Left Heart Cath and Coronary Angiography;  Surgeon: Iran OuchMuhammad A Arida, MD;  Location: [facility] INVASIVE CV LAB;  Service: Cardiovascular;  Laterality: N/A;  . CAROTID STENT INSERTION Bilateral 2011-2012   right-left  . CATARACT EXTRACTION W/ INTRAOCULAR LENS  IMPLANT, BILATERAL Bilateral 2009-2010  . CHOLECYSTECTOMY OPEN  ~ 2014  . CORONARY ANGIOPLASTY    . CORONARY ANGIOPLASTY WITH STENT PLACEMENT  11/21/2015  . CORONARY ARTERY BYPASS GRAFT  08/1995  . DILATION AND  CURETTAGE OF UTERUS  X 1  . 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 TONSILLECTOMY  ~ 1947   2nd grade    Medications  Prior to Admission  Medication Sig Dispense Refill Last Dose  . ranolazine (RANEXA) 500 MG 12 hr tablet Take 1 tablet by mouth 2 (two) times daily.     . Adalimumab (HUMIRA PEN [place]) Inject 1 application into the skin See admin instructions. Patient takes it twice monthly.The next one is due June 26th   Past Week at 0800  . aspirin EC 81 MG tablet Take 81 mg by mouth at bedtime.   06/25/2017 at 2100  . atorvastatin (LIPITOR) 80 MG tablet Take 1 tablet (80 mg total) by mouth at bedtime. (Patient taking differently: Take 40 mg by mouth at bedtime. ) 30 tablet 3 06/26/2017 at 0800  . brimonidine (ALPHAGAN) 0.2 % ophthalmic solution Place 1 drop into both eyes 2 (two) times daily.    06/26/2017 at 0800  . clopidogrel (PLAVIX) 75 MG tablet Take 75 mg by mouth every morning.    06/26/2017 at 0800  . doxycycline (VIBRAMYCIN) 100 MG capsule Take 1 capsule by mouth 2 (two) times daily.     [name] glipiZIDE (GLUCOTROL XL) 10 MG 24 hr tablet Take 10 mg by mouth every morning.   06/26/2017 at 0800  . guaiFENesin-dextromethorphan (ROBITUSSIN DM) 100-10 MG/5ML syrup Take 5 mLs every 4 (four) hours as needed by mouth for cough. 118 mL 0   . Insulin Glargine (LANTUS SOLOSTAR) 100 UNIT/ML Solostar Pen Inject 24 Units into the skin at bedtime.   06/25/2017 at 2100  . insulin lispro (HUMALOG) 100 UNIT/ML injection Inject 6-8 Units into the skin 3 (three) times daily before meals. Per sliding scale: 6 units plus:   CBG 150-200 add 1 unit, 201-250 add 2 units, 251-300 add 3 units, 301-350 add 4 units 351-400 add 5 units, >400 add 6 units and call MD   06/26/2017 at 0800  . isosorbide mononitrate (IMDUR) 60 MG 24 hr tablet Take 1 tablet (60 mg total) daily by mouth. 30 tablet 0   . lamoTRIgine (LAMICTAL) 25 MG tablet Take 50 mg by mouth 2 (two) times daily.   06/26/2017 at 0800  . levETIRAcetam (KEPPRA) 750 MG tablet Take 750 mg by mouth 2 (two) times daily.   06/26/2017 at 0800  . metFORMIN (GLUCOPHAGE) 1000 MG tablet Take 1 tablet  (1,000 mg total) by mouth 2 (two) times daily.   06/26/2017 at 0800  . metoprolol tartrate (LOPRESSOR) 25 MG tablet Take 1 tablet (25 mg total) by mouth at bedtime. 30 tablet 12 06/25/2017 at 2000  . nitroGLYCERIN (NITRODUR - DOSED IN MG/24 HR) 0.4 mg/hr patch Place 0.4 mg onto the skin daily as needed (for chest pain).    prn at prn  . nitroGLYCERIN (NITROSTAT) 0.4 MG SL tablet Place 0.4 mg under the tongue every 5 (five) minutes as needed for chest pain.   prn at prn  . nystatin cream (MYCOSTATIN) Apply 1 application topically 2 (two) times daily as needed (yeast infection).    prn at prn  . ranitidine (ZANTAC) 150 MG tablet Take 150 mg by mouth every morning.    06/26/2017 at 0800  . senna-docusate (SENOKOT-S) 8.6-50 MG tablet Take 1 tablet 2 (two) times daily by mouth. 1 tablet    . timolol (TIMOPTIC) 0.5 % ophthalmic solution Place 1 drop into both eyes 2 (two) times daily.  06/26/2017 at 0800  . triamcinolone ointment (KENALOG) 0.1 % Apply 1 application topically 3 (three) times daily as needed (itching from psoriasis).   06/26/2017 at 0800  . vitamin B-12 (CYANOCOBALAMIN) 1000 MCG tablet Take 1,000 mcg by mouth every morning.    06/26/2017 at 0800  . vitamin E 400 UNIT capsule Take 400 Units by mouth every morning.    06/26/2017 at 0800   Social History   Socioeconomic History  . Marital status: Widowed    Spouse name: Not on file  . Number of children: Not on file  . Years of education: Not on file  . Highest education level: Not on file  Social Needs  . Financial resource strain: Not on file  . Food insecurity - worry: Not on file  . Food insecurity - inability: Not on file  . Transportation needs - medical: Not on file  . Transportation needs - non-medical: Not on file  Occupational History  . Not on file  Tobacco Use  . Smoking status: Never Smoker  . Smokeless tobacco: Never Used  Substance and Sexual Activity  . Alcohol use: No  . Drug use: No  . Sexual activity: No  Other  Topics Concern  . Not on file  Social History Narrative   Lives at home with one of her sons. Independent at baseline.    Family History  Problem Relation Age of Onset  . Heart attack Father   . Diabetes Mother   . Cancer Mother   . Stroke Mother   . Breast cancer Cousin   . Breast cancer Cousin   . Breast cancer Cousin       Review of systems complete and found to be negative unless listed above      PHYSICAL EXAM  General: Well developed, well nourished, in no acute distress HEENT:  Normocephalic and atramatic Neck:  No JVD.  Lungs: Clear bilaterally to auscultation and percussion. Heart: HRRR . Normal S1 and S2 without gallops or murmurs.  Abdomen: Bowel sounds are positive, abdomen soft and non-tender  Msk:  Back normal, normal gait. Normal strength and tone for age. Extremities: No clubbing, cyanosis or edema.   Neuro: Alert and oriented X 3. Psych:  Good affect, responds appropriately  Labs:   Lab Results  Component Value Date   WBC 4.3 08/11/2017   HGB 9.9 (L) 08/11/2017   HCT 30.8 (L) 08/11/2017   MCV 83.9 08/11/2017   PLT 159 08/11/2017    Recent Labs  Lab 08/10/17 1829 08/11/17 0522  NA 136 139  K 4.5 4.6  CL 104 109  CO2 25 24  BUN 17 12  CREATININE 0.57 0.56  CALCIUM 9.1 8.3*  PROT 7.3  --   BILITOT 0.6  --   ALKPHOS 50  --   ALT 12*  --   AST 24  --   GLUCOSE 154* 211*   Lab Results  Component Value Date   CKTOTAL 91 12/14/2014   CKMB 4.6 12/14/2014   TROPONINI 0.10 (HH) 08/11/2017    Lab Results  Component Value Date   CHOL 158 06/26/2017   CHOL 140 07/22/2016   CHOL 156 07/21/2016   Lab Results  Component Value Date   HDL 48 06/26/2017   HDL 44 07/22/2016   HDL 49 07/21/2016   Lab Results  Component Value Date   LDLCALC 47 06/26/2017   LDLCALC 65 07/22/2016   LDLCALC 60 07/21/2016   Lab Results  Component Value Date  TRIG 316 (H) 06/26/2017   TRIG 153 (H) 07/22/2016   TRIG 235 (H) 07/21/2016   Lab Results   Component Value Date   CHOLHDL 3.3 06/26/2017   CHOLHDL 3.2 07/22/2016   CHOLHDL 3.2 07/21/2016   No results found for: LDLDIRECT    Radiology: Dg Chest 2 View  Result Date: 08/10/2017 CLINICAL DATA:  Chest pain EXAM: CHEST  2 VIEW COMPARISON:  06/26/2017 FINDINGS: Prior CABG. Heart and mediastinal contours are within normal limits. No focal opacities or effusions. No acute bony abnormality. IMPRESSION: No active cardiopulmonary disease. Electronically Signed   By: Charlett Nose M.D.   On: 08/10/2017 18:53   Ct Chest Wo Contrast  Result Date: 07/26/2017 CLINICAL DATA:  Persistent productive cough for 5 weeks. Right chest soreness. No reported history of malignancy or smoking. EXAM: CT CHEST WITHOUT CONTRAST TECHNIQUE: Multidetector CT imaging of the chest was performed following the standard protocol without IV contrast. COMPARISON:  Chest radiograph 06/26/2017 FINDINGS: Cardiovascular: Thoracic aortic and coronary artery atherosclerosis status post CABG. No aortic aneurysm. Upper limits of normal heart size. No pericardial effusion. Mediastinum/Nodes: Small calcifications in the medial left breast. No enlarged axillary, mediastinal, or hilar lymph nodes. Unremarkable thyroid and esophagus. Lungs/Pleura: No pleural effusion or pneumothorax. There is mild bronchial wall thickening diffusely. Scattered small clustered nodules are present throughout all lobes of the lungs bilaterally, at least some of which are centrilobular in distribution though others involve the subpleural region. The largest right lung nodule measures 8 mm in the medial right upper lobe and extends to the pleura (series 3, image 43). Basilar left lower lobe nodules measure up to 8 mm in size (series 3, image 122). There is no segmental consolidation or mass. Upper Abdomen: Prior cholecystectomy.  Atherosclerosis. Musculoskeletal: Thoracic spondylosis. No suspicious osseous lesion. IMPRESSION: 1. Bronchial wall thickening and  clustered small nodules throughout the lungs bilaterally, likely infectious/inflammatory. Non-contrast chest CT at 3-6 months is recommended. If the nodules are stable at time of repeat CT, then future CT at 18-24 months (from today's scan) is considered optional for low-risk patients, but is recommended for high-risk patients. This recommendation follows the consensus statement: Guidelines for Management of Incidental Pulmonary Nodules Detected on CT Images: From the Fleischner Society 2017; Radiology 2017; 284:228-243. 2. Aortic Atherosclerosis (ICD10-I70.0). Electronically Signed   By: Sebastian Ache M.D.   On: 07/26/2017 09:58    EKG: Normal sinus rhythm, nonspecific ST depression inferolaterally  ASSESSMENT AND PLAN:   1.  Unstable angina, minimal elevation in troponin without peak or trough, likely not non-ST elevation myocardial infarction, in patient with known complex coronary artery disease, with recurrent episodes of chest pain, with known small vessel coronary disease, occluded vein grafts, patent LIMA to LAD with patent stent ramus intermedius branch by 2 previous cardiac catheterizations.  We had a lengthy discussion about the potential for repeat cardiac catheterization at this time, and elected to proceed with initial conservative approach.  The patient has done well in the past on Ranexa for anginal relief. 2.  Hypotension, resolved after fluid resuscitation 3.  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  DC heparin 2.  DC nitro drip 3.  Start topical nitrates 4.  Restart Ranexa 500 mg twice daily 6.  The patient has recurrent chest pain despite initial medical management, then would reconsider cardiac catheterization.  Patient and patient's son who is an EMT and knowledgeable about her mother's condition, appear to have the appropriate expectations if a repeat cardiac catheterization  and no further intervention is needed.  Signed: Marcina Millard MD,PhD,  [facility] 08/11/2017, 9:06 AM

## 2017-08-11 NOTE — Progress Notes (Signed)
Inpatient Diabetes Program Recommendations  AACE/ADA: New Consensus Statement on Inpatient Glycemic Control (2015)  Target Ranges:  Prepandial:   less than 140 mg/dL      Peak postprandial:   less than 180 mg/dL (1-2 hours)      Critically ill patients:  140 - 180 mg/dL   Results for Leah Small, Leah Small (MRN [id number]) as of 08/11/2017 12:48  Ref. Range 08/10/2017 23:13 08/10/2017 23:32 08/11/2017 01:54 08/11/2017 07:34 08/11/2017 12:00  Glucose-Capillary Latest Ref Range: 65 - 99 mg/dL 37 (LL) 213247 (H) 086182 (H) 148 (H) 163 (H)   Review of Glycemic Control  Diabetes history: DM 2 Outpatient Diabetes medications: Glipizide 10 mg Daily, Lantus 24 units QHS, Humalog 6-8 units tid with meals, Metformin 1000 mg BID  Current orders for Inpatient glycemic control: Lantus 24 units, Novolog Sensitive Correction 0-9 units tid + Novolog HS scale 0-5 units  Inpatient Diabetes Program Recommendations:    A1c 6.7 on 06/27/17  Patient glucose just now within inpatient goal between 140-180. Patient glucose controlled with correction so far. Patient has not received basal insulin yet. Please consider holding Lantus dose tonight, or ordering half 12 units, until glucose in consistently above inpatient goal.  Thanks,  Christena DeemShannon Geana Walts RN, MSN, [facility] Inpatient Diabetes Coordinator Team Pager [phone] (8a-5p)

## 2017-08-11 NOTE — Progress Notes (Addendum)
At approximately 0115 ptn began to complain of 8/10 chest pain radiating down left arm and stated she had a crushing sensation in chest. Blood pressure was elevated and soon climbed up to a peak of 180/130, patient was also experiencing dyspnea, had shortness of breath and expiratory wheezes were auscultated.  [facility]-link doctor was contacted and Dr. Levada SchillingSummers placed orders for .4 mg PRN sublingual nitroglycerin and a nitroglycerin drip for sustained pain relief and blood pressure control. Nitrostat SL tab was given at 0130 and gave little pain relief but ptn stated that crushing sensation was being alleviated. About 15 min later pain began to climb back up to 10/10 and nitro drip was started. Nitro drip completely alleviated pain and brought down blood pressure within the hour. NP has been notified of event and has also been notified of elevated troponin of 0.10. No further orders were given. Will continue to titrate nitro drip and monitor patient status.

## 2017-08-11 NOTE — Progress Notes (Signed)
[employer] [facility] [place] [facility]   PATIENT NAME: Leah Small    MR#:  [id number]  DATE OF BIRTH:  November 21, 1940  SUBJECTIVE:  CHIEF COMPLAINT:   Chief Complaint  Patient presents with  . Chest Pain   No chest pain. REVIEW OF SYSTEMS:  Review of Systems  Constitutional: Negative for chills, fever and malaise/fatigue.  HENT: Negative for sore throat.   Eyes: Negative for blurred vision and double vision.  Respiratory: Negative for cough, hemoptysis, shortness of breath, wheezing and stridor.   Cardiovascular: Negative for chest pain, palpitations, orthopnea and leg swelling.  Gastrointestinal: Negative for abdominal pain, blood in stool, diarrhea, melena, nausea and vomiting.  Genitourinary: Negative for dysuria, flank pain and hematuria.  Musculoskeletal: Negative for back pain and joint pain.  Neurological: Negative for dizziness, sensory change, focal weakness, seizures, loss of consciousness, weakness and headaches.  Endo/Heme/Allergies: Negative for polydipsia.  Psychiatric/Behavioral: Negative for depression. The patient is not nervous/anxious.     DRUG ALLERGIES:   Allergies  Allergen Reactions  . Talwin [Pentazocine] Anaphylaxis and Swelling    Throat swelling  . Valium [Diazepam] Other (See Comments)    Makes her feel like she's flying  . Vicodin [Hydrocodone-Acetaminophen] Nausea And Vomiting  . Adhesive [Tape] Other (See Comments)    Bruising and TEARING!!!  Please use "paper" tape  . Beta Adrenergic Blockers Other (See Comments)    Pt states she not allergic  . Levofloxacin Swelling and Other (See Comments)    Tongue swells   . Simvastatin Swelling and Rash    Mouth swelling   VITALS:  Blood pressure 126/67, pulse 72, temperature (!) 97.5 F (36.4 C), temperature source Oral, resp. rate (!) 22, height 5\' 8"  (1.727 m), weight 135 lb (61.2 kg), SpO2 94 %. PHYSICAL EXAMINATION:  Physical Exam  Constitutional: She is oriented to  person, place, and time and well-developed, well-nourished, and in no distress.  HENT:  Head: Normocephalic.  Mouth/Throat: Oropharynx is clear and moist.  Eyes: Conjunctivae and EOM are normal. Pupils are equal, round, and reactive to light. No scleral icterus.  Neck: Normal range of motion. Neck supple. No JVD present. No tracheal deviation present.  Cardiovascular: Normal rate, regular rhythm and normal heart sounds. Exam reveals no gallop.  No murmur heard. Pulmonary/Chest: Effort normal and breath sounds normal. No respiratory distress. She has no wheezes. She has no rales.  Abdominal: Soft. Bowel sounds are normal. She exhibits no distension. There is no tenderness. There is no rebound.  Musculoskeletal: Normal range of motion. She exhibits no edema or tenderness.  Neurological: She is alert and oriented to person, place, and time. No cranial nerve deficit.  Skin: No rash noted. No erythema.  Psychiatric: Affect normal.   LABORATORY PANEL:  Small CBC Recent Labs  Lab 08/11/17 0522  WBC 4.3  HGB 9.9*  HCT 30.8*  PLT 159   ------------------------------------------------------------------------------------------------------------------ Chemistries  Recent Labs  Lab 08/10/17 1829 08/11/17 0522  NA 136 139  K 4.5 4.6  CL 104 109  CO2 25 24  GLUCOSE 154* 211*  BUN 17 12  CREATININE 0.57 0.56  CALCIUM 9.1 8.3*  AST 24  --   ALT 12*  --   ALKPHOS 50  --   BILITOT 0.6  --    RADIOLOGY:  Dg Chest 2 View  Result Date: 08/10/2017 CLINICAL DATA:  Chest pain EXAM: CHEST  2 VIEW COMPARISON:  06/26/2017 FINDINGS: Prior CABG. Heart and mediastinal contours are within normal limits.  No focal opacities or effusions. No acute bony abnormality. IMPRESSION: No active cardiopulmonary disease. Electronically Signed   By: Charlett NoseKevin  Dover M.D.   On: 08/10/2017 18:53   ASSESSMENT AND PLAN:   Leah Small  is a 76 y.o. Small with a known history of CAD status post CABG several years  ago, cardiac stent placement in March 2017, few admissions for unstable angina after that requiring medical management, hypertension, psoriatic arthritis, diabetes mellitus, complex partial seizures presents from home secondary to sudden onset of chest pain.  1. Unstable angina, minimal elevation in troponin without peak or trough, likely not non-ST elevation myocardial infarction per Dr. Darrold JunkerParaschos. -Significant known 3 vessel disease. Discontinue heparin drip and nitro drip, start topical nitrate and then restart Ranexa 500 mg twice daily per Dr. Darrold JunkerParaschos. -Continue aspirin and Plavix. Hold other blood pressure medications.  2. Hypotension-could be cardiogenic shock. Received 2 L fluids already. -Started on IV fluids and monitor carefully due to her known history of CHF. Discontinued dopamine drip. Echocardiogram. - hold BP meds  3. Diabetes mellitus-on Lantus and also sliding scale insulin.  4. 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 Continue Lamictal and Keppra.  All the records are reviewed and case discussed with Care Management/Social Worker. Management plans discussed with the patient, her son and they are in agreement.  CODE STATUS: DNR  TOTAL TIME TAKING CARE OF THIS PATIENT: 32 minutes.   More than 50% of the time was spent in counseling/coordination of care: YES  POSSIBLE D/C IN 1-2 DAYS, DEPENDING ON CLINICAL CONDITION.   Shaune PollackQing Eitan Doubleday M.D on 08/11/2017 at 1:47 PM  Between 7am to 6pm - Pager - [phone]  After 6pm go to [email or web address] - Therapist, nutritionalpassword [email or web address]  [employer] [place] [facility]

## 2017-08-11 NOTE — Progress Notes (Signed)
ANTICOAGULATION CONSULT NOTE - Initial Consult  Pharmacy Consult for heparin drip Indication: chest pain/ACS  Allergies  Allergen Reactions  . Talwin [Pentazocine] Anaphylaxis and Swelling    Throat swelling  . Valium [Diazepam] Other (See Comments)    Makes her feel like she's flying  . Vicodin [Hydrocodone-Acetaminophen] Nausea And Vomiting  . Adhesive [Tape] Other (See Comments)    Bruising and TEARING!!!  Please use "paper" tape  . Beta Adrenergic Blockers Other (See Comments)    Pt states she not allergic  . Levofloxacin Swelling and Other (See Comments)    Tongue swells   . Simvastatin Swelling and Rash    Mouth swelling    Patient Measurements: Height: 5\' 8"  (172.7 cm) Weight: 135 lb (61.2 kg) IBW/kg (Calculated) : 63.9 Heparin Dosing Weight: 61 kg  Vital Signs: Temp: 97.4 F (36.3 C) (12/18 1830) Temp Source: Oral (12/18 1830) BP: 98/49 (12/19 0000) Pulse Rate: 63 (12/19 0000)  Labs: Recent Labs    08/10/17 1829 08/11/17 0035 08/11/17 0310  HGB 10.2*  --   --   HCT 31.4*  --   --   PLT 180  --   --   APTT 29  --   --   LABPROT 13.7  --   --   INR 1.06  --   --   HEPARINUNFRC  --   --  0.30  CREATININE 0.57  --   --   TROPONINI <0.03 0.10* 0.10*    Estimated Creatinine Clearance: 56.9 mL/min (by C-G formula based on SCr of 0.57 mg/dL).   Medical History: Past Medical History:  Diagnosis Date  . Acid reflux   . Arthritis    "all over"  . Chronic stable angina (HCC)   . Coronary artery disease   . Hypercholesterolemia   . Hypertension   . Myocardial infarction (HCC) 1991; 07/06/2015  . NSTEMI (non-ST elevated myocardial infarction) (HCC) 10/22/2015  . Psoriatic arthritis (HCC)   . 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 TIA (transient ischemic attack)    "several over the last 20 years" (10/22/2015)  . Type II diabetes mellitus (HCC)     Assessment: Pharmacy consulted to dose  and monitor heparin drip in this 76 year old female for ACS. No anticoagulants prior to admission. Baseline labs ordered.  Goal of Therapy:  Heparin level 0.3-0.7 units/ml Monitor platelets by anticoagulation protocol: Yes   Plan:  Give 3500 units bolus x 1 Start heparin infusion at 750 units/hr Check anti-Xa level in 8 hours and daily while on heparin Continue to monitor H&H and platelets   12/19 @ 0300 HL 0.30 therapeutic. Will continue current rate and will recheck HL @ 1100.   Thomasene Rippleavid  Darrelle Barrell, PharmD, BCPS Clinical Pharmacist 08/11/2017,5:31 AM

## 2017-08-12 ENCOUNTER — Encounter: Payer: Self-pay | Admitting: *Deleted

## 2017-08-12 ENCOUNTER — Inpatient Hospital Stay: Payer: Medicare Other

## 2017-08-12 DIAGNOSIS — E440 Moderate protein-calorie malnutrition: Secondary | ICD-10-CM

## 2017-08-12 DIAGNOSIS — R079 Chest pain, unspecified: Secondary | ICD-10-CM

## 2017-08-12 LAB — GLUCOSE, CAPILLARY
GLUCOSE-CAPILLARY: 184 mg/dL — AB (ref 65–99)
Glucose-Capillary: 162 mg/dL — ABNORMAL HIGH (ref 65–99)
Glucose-Capillary: 217 mg/dL — ABNORMAL HIGH (ref 65–99)
Glucose-Capillary: 242 mg/dL — ABNORMAL HIGH (ref 65–99)
Glucose-Capillary: 223 mg/dL — ABNORMAL HIGH (ref 65–99)

## 2017-08-12 LAB — CBC
HCT: 33.4 % — ABNORMAL LOW (ref 35.0–47.0)
Hemoglobin: 10.9 g/dL — ABNORMAL LOW (ref 12.0–16.0)
MCH: 26.7 pg (ref 26.0–34.0)
MCHC: 32.5 g/dL (ref 32.0–36.0)
MCV: 82.2 fL (ref 80.0–100.0)
Platelets: 165 K/uL (ref 150–440)
RBC: 4.07 MIL/uL (ref 3.80–5.20)
RDW: 17.6 % — ABNORMAL HIGH (ref 11.5–14.5)
WBC: 5.2 K/uL (ref 3.6–11.0)

## 2017-08-12 LAB — COMPREHENSIVE METABOLIC PANEL
ALK PHOS: 54 U/L (ref 38–126)
ALT: 17 U/L (ref 14–54)
AST: 28 U/L (ref 15–41)
Albumin: 3.9 g/dL (ref 3.5–5.0)
Anion gap: 8 (ref 5–15)
BILIRUBIN TOTAL: 0.5 mg/dL (ref 0.3–1.2)
BUN: 14 mg/dL (ref 6–20)
CALCIUM: 8.6 mg/dL — AB (ref 8.9–10.3)
CO2: 23 mmol/L (ref 22–32)
CREATININE: 0.68 mg/dL (ref 0.44–1.00)
Chloride: 103 mmol/L (ref 101–111)
GFR calc Af Amer: >60 mL/min (ref >60)
Glucose, Bld: 211 mg/dL — ABNORMAL HIGH (ref 65–99)
POTASSIUM: 4.1 mmol/L (ref 3.5–5.1)
Sodium: 134 mmol/L — ABNORMAL LOW (ref 135–145)
TOTAL PROTEIN: 7.5 g/dL (ref 6.5–8.1)

## 2017-08-12 LAB — PROTIME-INR
INR: 0.98
Prothrombin Time: 12.9 s (ref 11.4–15.2)

## 2017-08-12 LAB — ECHOCARDIOGRAM COMPLETE
Height: 68 in
Weight: 2160 oz

## 2017-08-12 LAB — TROPONIN I: Troponin I: 0.14 ng/mL (ref <0.03)

## 2017-08-12 MED ORDER — SODIUM CHLORIDE 0.9 % IV SOLN
1000.0000 mg | INTRAVENOUS | Status: AC
  Administered 2017-08-12: 1000 mg via INTRAVENOUS
  Filled 2017-08-12: qty 10

## 2017-08-12 MED ORDER — CHLORHEXIDINE GLUCONATE 0.12 % MT SOLN
15.0000 mL | Freq: Two times a day (BID) | OROMUCOSAL | Status: DC
  Administered 2017-08-12 – 2017-08-14 (×3): 15 mL via OROMUCOSAL
  Filled 2017-08-12 (×2): qty 15

## 2017-08-12 MED ORDER — ORAL CARE MOUTH RINSE: 15.0000 mL | Freq: Two times a day (BID) | OROMUCOSAL | Status: DC

## 2017-08-12 MED ORDER — SODIUM CHLORIDE 0.9 % IV SOLN
INTRAVENOUS | Status: DC
  Administered 2017-08-12 – 2017-08-13 (×3): via INTRAVENOUS

## 2017-08-12 MED ORDER — ISOSORBIDE MONONITRATE ER 30 MG PO TB24: 30.0000 mg | ORAL_TABLET | Freq: Every day | ORAL | 2 refills | Status: DC

## 2017-08-12 MED ORDER — IOPAMIDOL (ISOVUE-370) INJECTION 76%
100.0000 mL | Freq: Once | INTRAVENOUS | Status: AC | PRN
  Administered 2017-08-12: 100 mL via INTRAVENOUS

## 2017-08-12 MED ORDER — CLOPIDOGREL BISULFATE 75 MG PO TABS: 75.0000 mg | ORAL_TABLET | ORAL | 2 refills | Status: DC

## 2017-08-12 MED ORDER — ISOSORBIDE MONONITRATE ER 30 MG PO TB24
30.0000 mg | ORAL_TABLET | Freq: Every day | ORAL | Status: DC
  Administered 2017-08-13 – 2017-08-14 (×2): 30 mg via ORAL
  Filled 2017-08-12 (×2): qty 1

## 2017-08-12 MED ORDER — LORAZEPAM 2 MG/ML IJ SOLN
2.0000 mg | Freq: Once | INTRAMUSCULAR | Status: AC
  Administered 2017-08-12: 2 mg via INTRAVENOUS

## 2017-08-12 MED ORDER — ENSURE ENLIVE PO LIQD
237.0000 mL | Freq: Two times a day (BID) | ORAL | Status: DC
  Administered 2017-08-12: 237 mL via ORAL

## 2017-08-12 MED ORDER — ADULT MULTIVITAMIN W/MINERALS CH
1.0000 | ORAL_TABLET | Freq: Every day | ORAL | Status: DC
  Administered 2017-08-12 – 2017-08-14 (×3): 1 via ORAL
  Filled 2017-08-12 (×3): qty 1

## 2017-08-12 MED ORDER — SODIUM CHLORIDE 0.9 % IV SOLN
1000.0000 mg | Freq: Two times a day (BID) | INTRAVENOUS | Status: DC
  Administered 2017-08-13: 1000 mg via INTRAVENOUS
  Filled 2017-08-12 (×2): qty 10

## 2017-08-12 MED ORDER — IPRATROPIUM-ALBUTEROL 0.5-2.5 (3) MG/3ML IN SOLN
RESPIRATORY_TRACT | Status: AC
  Administered 2017-08-12: 3 mL
  Filled 2017-08-12: qty 3

## 2017-08-12 NOTE — Progress Notes (Signed)
Chaplain responded to a code Stroke overhead page for pt in [phone]. Upon arrival the medical team was on the scene evaluating pt. No family was present. CH provided emotional support and a ministry of presence. CH is available to follow up pt as needed.   08/12/17 1600  Clinical Encounter Type  Visited With Patient;Health care provider  Visit Type Initial;Code  Referral From Nurse;Other (Comment)  Consult/Referral To Chaplain  Spiritual Encounters  Spiritual Needs Emotional;Other (Comment)

## 2017-08-12 NOTE — Progress Notes (Signed)
Discussed with son, Dorinda HillDonald over the phone about the sudden change in patient's condition.  Also discussed about possible transfer to ICU and consideration of TPA and stat imaging studies. 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  But they resolve spontaneously within 10-15 minutes.  She is on Keppra for that. So passed on this information to telemetry neurologist Dr. Shirline FreesMohammed and to ICU nurse practitioner.

## 2017-08-12 NOTE — Progress Notes (Signed)
Patient c/o chest pain. Annabelle Harmanana, NP at bedside, EKG ordered and completed. Will continue to monitor patient.

## 2017-08-12 NOTE — Significant Event (Signed)
Rapid Response Event Note  Overview: Time Called: 1541 Arrival Time: 1543 Event Type: Neurologic  Initial Focused Assessment: Rapid Response RN notified of Code Stroke called on 2A. Arrived in room 254 with patient in bed and expressive aphasia heard as patient only able to answer "ah ah ah ah" to questions asked. CBG just obtained by 2A NT and was  242. Initial vital signs 212/103, 94% on room air, RR 24, HR 92 SR. Code Stroke Coordinator RN, Therapist, occupationallivia Broomer, arrived as completing initial history and vital signs. Patient had been set for discharge and was walking around unit when patient's RN noted left sided weakness at 15:15. Patient was guided to wheelchair and patient  Patient was BE FAST and VAN positive. Noted left sided weakness and left sided drift and expressive aphasia. Patient able to follow commands and had no visual deficits. Dr. Imogene Burnhen approved CTA order.    Interventions: Patient taken to CT with this RN, Ladona Ridgelaylor RN (receiving RN from ICU), Serenity RN (patient's RN while she was on 2A), and Ambulance personlivia RN. Olivia RN obtained tele-neurology cart from ED during non contrast head CT and brought to CT room. CT without contrast obtained and CTA. Tele-neurologist ordered CT with cerebral perfusion and performed assessment with assistance from RNs.  so Loree Feeaylor RN present placed 18 gauge IV per requirements for that test communicated by CT tech. Light green, lavender, and blue lab tubes obtained. Patient still with left sided weakness but aphasia lessened and patient able to speak words with slurred speech. Son able to be contacted by MD and son stated patient had history of complex partial seizures that present with stroke like symptoms like those on display now. In light of seizures MD ordered 2 mg of ativan IV stat. Vital signs in CT 160/85, HR 101 SR, RR 22, and 95% o2 sat on room air.  Plan of Care (if not transferred): Transferred to ICU 5, IV ativan given upon arrival in ICU. Code stroke ended per  Tele-neurologist. Sonda Rumbleana Blakeney NP at ICU 5 upon patient's arrival to ICU, where she ordered a stat 12 lead EKG and troponin added to blood obtained while in CT since patient complained of some chest pain. In ICU 5 patient able to talk in full sentences and was able to start moving left leg and more movement in left arm.  Event Summary: Name of Physician Notified: Dr. Imogene Burnhen at [phone]  Name of Consulting Physician Notified: Tele Neurologist Dr. Lewis MoccasinMasud at 1603  Outcome: Transferred (Comment), Other (Comment)(ICU 5, Code Stroke)   End Time: 17:00  Christell ConstantMOORE, [facility] [place]

## 2017-08-12 NOTE — Code Documentation (Signed)
Code stroke called at 1541, pt on inpatient unit, nurse was walking pt when she became weak on ehr left sided, assisted to chair and then pt became aphasic, code stroke called, upon arrival rapid/icu RN at bedside, hospitaliast arrived for eval, pt taken to CT, tele-neuro cart brought to CT where pt was evaluated, CT, CTA, and CTP preformed, per son pt has hx of seizures that "mimic" strokes, tele-neuro spoke to son, pt given 2 mg Ativan, Not a TPA canidate due to likely seizure, per tele neuro TPA was discussed with her son who is EMT who states that this is typical of her complex partial seizure, pt taken to ICU from CT, Pt returned to baseline at 1650 while in ICU after ativan, see documentation for times

## 2017-08-12 NOTE — Progress Notes (Signed)
[employer] [facility] [place] [facility]   PATIENT NAME: Leah FrederickDonnie Small    MR#:  [id number]  DATE OF BIRTH:  09-01-1940  SUBJECTIVE:  CHIEF COMPLAINT:   Chief Complaint  Patient presents with  . Chest Pain   - Doing well today. No chest pain. -Ambulation planned for today. Blood pressure is low normal.  REVIEW OF SYSTEMS:  Review of Systems  Constitutional: Negative for chills, fever and malaise/fatigue.  HENT: Negative for congestion, hearing loss and nosebleeds.   Eyes: Negative for blurred vision and double vision.  Respiratory: Negative for cough, shortness of breath and wheezing.   Cardiovascular: Negative for chest pain, palpitations and leg swelling.  Gastrointestinal: Negative for abdominal pain, constipation, diarrhea, nausea and vomiting.  Genitourinary: Negative for dysuria.  Neurological: Negative for dizziness, speech change, focal weakness, seizures and headaches.  Psychiatric/Behavioral: Negative for depression.    DRUG ALLERGIES:   Allergies  Allergen Reactions  . Talwin [Pentazocine] Anaphylaxis and Swelling    Throat swelling  . Valium [Diazepam] Other (See Comments)    Makes her feel like she's flying  . Vicodin [Hydrocodone-Acetaminophen] Nausea And Vomiting  . Adhesive [Tape] Other (See Comments)    Bruising and TEARING!!!  Please use "paper" tape  . Beta Adrenergic Blockers Other (See Comments)    Pt states she not allergic  . Levofloxacin Swelling and Other (See Comments)    Tongue swells   . Simvastatin Swelling and Rash    Mouth swelling    VITALS:  Blood pressure (!) 133/54, pulse 75, temperature 97.8 F (36.6 C), temperature source Oral, resp. rate 18, height 5\' 8"  (1.727 m), weight 60.6 kg (133 lb 8 oz), SpO2 100 %.  PHYSICAL EXAMINATION:  Physical Exam  GENERAL:  76 y.o.-year-old patient lying in the bed with no acute distress.  EYES: Pupils equal, round, reactive to light and accommodation. No scleral icterus.  Extraocular muscles intact.  HEENT: Head atraumatic, normocephalic. Oropharynx and nasopharynx clear.  NECK:  Supple, no jugular venous distention. No thyroid enlargement, no tenderness.  LUNGS: Normal breath sounds bilaterally, no wheezing, rales,rhonchi or crepitation. No use of accessory muscles of respiration.  CARDIOVASCULAR: S1, S2 normal. No  rubs, or gallops. 2/6 systolic murmur present ABDOMEN: Soft, nontender, nondistended. Bowel sounds present. No organomegaly or mass.  EXTREMITIES: No pedal edema, cyanosis, or clubbing.  NEUROLOGIC: Cranial nerves II through XII are intact. Muscle strength 5/5 in all extremities. Sensation intact. Gait not checked.  PSYCHIATRIC: The patient is alert and oriented x 3.  SKIN: No obvious rash, lesion, or ulcer.    LABORATORY PANEL:   CBC Recent Labs  Lab 08/11/17 0522  WBC 4.3  HGB 9.9*  HCT 30.8*  PLT 159   ------------------------------------------------------------------------------------------------------------------  Chemistries  Recent Labs  Lab 08/10/17 1829 08/11/17 0522  NA 136 139  K 4.5 4.6  CL 104 109  CO2 25 24  GLUCOSE 154* 211*  BUN 17 12  CREATININE 0.57 0.56  CALCIUM 9.1 8.3*  AST 24  --   ALT 12*  --   ALKPHOS 50  --   BILITOT 0.6  --    ------------------------------------------------------------------------------------------------------------------  Cardiac Enzymes Recent Labs  Lab 08/11/17 0310  TROPONINI 0.10*   ------------------------------------------------------------------------------------------------------------------  RADIOLOGY:  Dg Chest 2 View  Result Date: 08/10/2017 CLINICAL DATA:  Chest pain EXAM: CHEST  2 VIEW COMPARISON:  06/26/2017 FINDINGS: Prior CABG. Heart and mediastinal contours are within normal limits. No focal opacities or effusions. No acute bony abnormality. IMPRESSION:  No active cardiopulmonary disease. Electronically Signed   By: Charlett NoseKevin  Dover M.D.   On: 08/10/2017  18:53    EKG:   Orders placed or performed during the hospital encounter of 08/10/17  . ED EKG  . ED EKG  . EKG 12-Lead  . EKG 12-Lead    ASSESSMENT AND PLAN:   Leah Small  is a 76 y.o. female with a known history of CAD status post CABG several years ago, cardiac stent placement in March 2017, few admissions for unstable angina after that requiring medical management, hypertension, psoriatic arthritis, diabetes mellitus, complex partial seizures presents from home secondary to sudden onset of chest pain.  1. Unstable angina-was significantly hypotensive with chest pain on admission. Admitted to ICU with ST depressions.. -Significant known 3 vessel disease. Last cardiac catheterization was in November 2017. Last stent was in March 2017. -Appreciate cardiology consult. Patient has ruled out for MI. -Blood pressure is improving. Started on low-dose Imdur and Ranexa at this time. -Symptoms have improved significantly. No further diagnostic cardiac testing to be performed. -Echocardiogram stable at baseline without any new wall motion changes.. -Continue aspirin and Plavix.   2. Hypotension-could be cardiogenic shock. Improved now. -On low-dose Imdur re-started if she can tolerate. Also continue low dose Toprol at bedtime at discharge. -Also will be and Ranexa. Received IV fluids during this admission.. -Echo with normal ejection fraction.  3. Diabetes mellitus-on Lantus and also sliding scale insulin.  4. 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 Continue Lamictal and Keppra  5. DVT prophylaxis-on lovenox   Updated patient and son at bedside. Encourage ambulation today.   All the records are reviewed and case discussed with Care Management/Social Workerr. Management plans discussed with the patient, family and they are in agreement.  CODE STATUS: Full code  TOTAL TIME TAKING CARE OF THIS PATIENT: 37 minutes.   POSSIBLE D/C TODAY OR TOMORROW, DEPENDING ON  CLINICAL CONDITION.   Enid BaasKALISETTI,Chanel Mcadams M.D on 08/12/2017 at 2:57 PM  Between 7am to 6pm - Pager - [phone]  After 6pm go to [email or web address] - Social research officer, governmentpassword [email or web address] [email or web address]  [facility]  Office  [phone]  CC: Primary care physician; Marguarite ArbourSparks, Jeffrey D, MD

## 2017-08-12 NOTE — Progress Notes (Signed)
[facility]  SUBJECTIVE: Patient lying in bed, denies chest pain   Vitals:   08/11/17 1947 08/11/17 2342 08/12/17 0504 08/12/17 0755  BP: (!) 160/95 (!) 125/53 (!) 109/59 (!) 98/46  Pulse: 83 69 73 66  Resp: 16  18   Temp: 98.1 F (36.7 C)  98.1 F (36.7 C) 97.7 F (36.5 C)  TempSrc: Oral  Oral   SpO2: 94%  98% 96%  Weight:   60.6 kg (133 lb 8 oz)   Height:         Intake/Output Summary (Last 24 hours) at 08/12/2017 0804 Last data filed at 08/12/2017 0504 Gross per 24 hour  Intake 346.15 ml  Output 2100 ml  Net -1753.85 ml      PHYSICAL EXAM  General: Well developed, well nourished, in no acute distress HEENT:  Normocephalic and atramatic Neck:  No JVD.  Lungs: Clear bilaterally to auscultation and percussion. Heart: HRRR . Normal S1 and S2 without gallops or murmurs.  Abdomen: Bowel sounds are positive, abdomen soft and non-tender  Msk:  Back normal, normal gait. Normal strength and tone for age. Extremities: No clubbing, cyanosis or edema.   Neuro: Alert and oriented X 3. Psych:  Good affect, responds appropriately   LABS: Basic Metabolic Panel: Recent Labs    08/10/17 1829 08/11/17 0522  NA 136 139  K 4.5 4.6  CL 104 109  CO2 25 24  GLUCOSE 154* 211*  BUN 17 12  CREATININE 0.57 0.56  CALCIUM 9.1 8.3*   Liver Function Tests: Recent Labs    08/10/17 1829  AST 24  ALT 12*  ALKPHOS 50  BILITOT 0.6  PROT 7.3  ALBUMIN 3.8   No results for input(s): LIPASE, AMYLASE in the last 72 hours. CBC: Recent Labs    08/10/17 1829 08/11/17 0522  WBC 4.4 4.3  HGB 10.2* 9.9*  HCT 31.4* 30.8*  MCV 82.6 83.9  PLT 180 159   Cardiac Enzymes: Recent Labs    08/10/17 1829 08/11/17 0035 08/11/17 0310  TROPONINI <0.03 0.10* 0.10*   BNP: Invalid input(s): POCBNP D-Dimer: No results for input(s): DDIMER in the last 72 hours. Hemoglobin A1C: No results for input(s): HGBA1C in the last 72 hours. Fasting Lipid Panel: No results for input(s): CHOL,  HDL, LDLCALC, TRIG, CHOLHDL, LDLDIRECT in the last 72 hours. Thyroid Function Tests: No results for input(s): TSH, T4TOTAL, T3FREE, THYROIDAB in the last 72 hours.  Invalid input(s): FREET3 Anemia Panel: No results for input(s): VITAMINB12, FOLATE, FERRITIN, TIBC, IRON, RETICCTPCT in the last 72 hours.  Dg Chest 2 View  Result Date: 08/10/2017 CLINICAL DATA:  Chest pain EXAM: CHEST  2 VIEW COMPARISON:  06/26/2017 FINDINGS: Prior CABG. Heart and mediastinal contours are within normal limits. No focal opacities or effusions. No acute bony abnormality. IMPRESSION: No active cardiopulmonary disease. Electronically Signed   By: Charlett NoseKevin  Dover M.D.   On: 08/10/2017 18:53     Echo   TELEMETRY: Normal sinus rhythm:  ASSESSMENT AND PLAN:  Active Problems:   Unstable angina (HCC)    1. Unstable angina, minimal elevation troponin, likely not due to non-ST elevation myocardial infarction, in patient with known severe three-vessel coronary artery disease, patent LIMA to LAD, and occluded vein grafts with patent stent ramus intermedius branch prior to recent previous cardiac catheterizations. The patient has known diffuse distal disease. I had a lengthy discussion with patient and patient's son and elected to proceed with conservative approach in light of patient's known coronary anatomy. Patient started  on high-dose isosorbide mononitrate and Ranexa with apparent relief of chest pain. 2. 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 Continue current therapy 2. Continue Ranexa 500 mg twice a day 3. Defer repeat cardiac catheterization at this time 4. Defer further cardiac diagnostics at this time 5. Increase activity, ambulate, if patient does well consider discharge later today or tomorrow  Sign off for now, please call if any questions   Marcina MillardAlexander Ilianna Bown, MD, PhD, [facility] Center[facility]ACC 08/12/2017 8:04 AM

## 2017-08-12 NOTE — Progress Notes (Signed)
Code Stroke called at this time.  MD Nemiah CommanderKalisetti and primary RN Serenity aware.

## 2017-08-12 NOTE — Consult Note (Signed)
Name: Leah FishmanDonnie R Small MRN: [id number] DOB: 1940/11/15    ADMISSION DATE:  08/10/2017  CONSULTATION DATE:  08/10/17  REFERRING MD :  Dr. Nemiah CommanderKalisetti  CHIEF COMPLAINT: Stroke Like Symptoms  BRIEF PATIENT DESCRIPTION:  76 year old female with multiple comorbidities and significant cardiac history with CAD status post CABG and PCI.  Presenting with an STEMI and hypotension.  LVEF = 50-55%.  Cardiology consulted recommendation for conservative approach will hold off on cardiac cath.  Pt transferred to telemetry unit 12/19.  Code Stroke initiated 12/20 due to pt developing left sided weakness, left facial droop, and aphasia at 1515, [facility]  SIGNIFICANT EVENTS  12/18-Patient [facility] with NSTEMI admitted to ICU for closer monitoring. 12/19-Pt transferred to telemetry unit 12/20-Code Stroke initiated due to developing left sided weakness, aphasia, and left facial droop at 1515 during ambulation prior to possible discharge home, however due to symptoms pt transferred to Stepdown Unit   STUDIES: Echo 12/19>>EF 50% to 55%  HISTORY OF PRESENT ILLNESS: Leah Small is a 76 year old female with multiple comorbidities including which includes GERD, diabetes mellitus, complex partial seizures, hypertension, psoriatic arthritis CAD status post CABG, stent placement in March 2017 and multiple angina .    Patient presented with chest pain in November and had cardiac catheterization that showed significant three-vessel disease which was medically managed.  Apparently patient was doing well up until 3 PM today where she  started to have chest pain which was radiating to her jaw. She took nitroglycerine tablet.   She had some relief however she continued to have chest pain therefore her son decided to call EMS.  Patient was initially hypertensive with systolic blood pressure in 200s.  However the patient became hypotensive.  She was given 2 L of fluid  . Her EKG was  concerning for NSTEMI and was a change from prior EKG. Initial troponins were negative.  Patient was started on heparin.  Cardiology was consulted.  Patient was transferred to the ICU for closer monitoring. She was transferred to telemetry unit 12/19.  Required transfer back to  ICU following initiation of Code Stroke due to new onset left sided weakness, aphasia, and facial droop during ambulation at 1515 concerning for possible CVA.      However PAST MEDICAL HISTORY :   has a past medical history of Acid reflux, Arthritis, Chronic stable angina (HCC), Coronary artery disease, Hypercholesterolemia, Hypertension, Myocardial infarction (HCC) (1991; 07/06/2015), NSTEMI (non-ST elevated myocardial infarction) (HCC) (10/22/2015), Psoriatic arthritis (HCC), Seizures (HCC), TIA (transient ischemic attack), and Type II diabetes mellitus (HCC).  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 Prior to Admission medications   Medication Sig Start Date End Date Taking? Authorizing Provider  ranolazine (RANEXA) 500 MG 12 hr tablet Take 1 tablet by mouth 2 (two) times daily. 03/26/14 07/05/18 Yes [provider]  Adalimumab (HUMIRA PEN [place]) Inject 1 application into the skin See admin instructions. Patient takes it twice monthly.The next one is due June 26th    [provider]  aspirin EC 81  MG tablet  Take 81 mg by mouth at bedtime.    [provider]  atorvastatin (LIPITOR) 80 MG tablet Take 1 tablet (80 mg total) by mouth at bedtime. Patient taking differently: Take 40 mg by mouth at bedtime.  11/22/15   Rai, Ripudeep K, MD  brimonidine (ALPHAGAN) 0.2 % ophthalmic solution Place 1 drop into both eyes 2 (two) times daily.     [provider]  clopidogrel (PLAVIX) 75 MG tablet Take 75 mg by mouth every morning.     [provider]  doxycycline (VIBRAMYCIN) 100 MG capsule Take 1 capsule by mouth 2 (two) times daily. 07/29/17   [provider]  glipiZIDE (GLUCOTROL XL) 10 MG 24 hr tablet Take 10 mg by mouth every morning. 04/26/15   [provider]  guaiFENesin-dextromethorphan (ROBITUSSIN DM) 100-10 MG/5ML syrup Take 5 mLs every 4 (four) hours as needed by mouth for cough. 06/29/17   Enedina Finner, MD  Insulin Glargine (LANTUS SOLOSTAR) 100 UNIT/ML Solostar Pen Inject 24 Units into the skin at bedtime.    [provider]  insulin lispro (HUMALOG) 100 UNIT/ML injection Inject 6-8 Units into the skin 3 (three) times daily before meals. Per sliding scale: 6 units plus:   CBG 150-200 add 1 unit, 201-250 add 2 units, 251-300 add 3 units, 301-350 add 4 units 351-400 add 5 units, >400 add 6 units and call MD    [provider]  isosorbide mononitrate (IMDUR) 60 MG 24 hr tablet Take 1 tablet (60 mg total) daily by mouth. 06/29/17   Enedina Finner, MD  lamoTRIgine (LAMICTAL) 25 MG tablet Take 50 mg by mouth 2 (two) times daily. 06/14/17   [provider]  levETIRAcetam (KEPPRA) 750 MG tablet Take 750 mg by mouth 2 (two) times daily.    [provider]  metFORMIN (GLUCOPHAGE) 1000 MG tablet Take 1 tablet (1,000 mg total) by mouth 2 (two) times daily. 01/30/16   Creig Hines, NP  metoprolol tartrate (LOPRESSOR) 25 MG tablet Take 1 tablet (25 mg total) by mouth at bedtime. 07/23/16   Little Ishikawa, NP  nitroGLYCERIN  (NITRODUR - DOSED IN MG/24 HR) 0.4 mg/hr patch Place 0.4 mg onto the skin daily as needed (for chest pain).  02/19/16   [provider]  nitroGLYCERIN (NITROSTAT) 0.4 MG SL tablet Place 0.4 mg under the tongue every 5 (five) minutes as needed for chest pain.    [provider]  nystatin cream (MYCOSTATIN) Apply 1 application topically 2 (two) times daily as needed (yeast infection).  04/25/15   [provider]  ranitidine (ZANTAC) 150 MG tablet Take 150 mg by mouth every morning.     [provider]  senna-docusate (SENOKOT-S) 8.6-50 MG tablet Take 1 tablet 2 (two) times daily by mouth. 06/29/17   Lewie Loron, NP  timolol (TIMOPTIC) 0.5 % ophthalmic solution Place 1 drop into both eyes 2 (two) times daily. 06/14/17   [provider]  triamcinolone ointment (KENALOG) 0.1 % Apply 1 application topically 3 (three) times daily as needed (itching from psoriasis).    [provider]  vitamin B-12 (CYANOCOBALAMIN) 1000 MCG tablet Take 1,000 mcg by mouth every morning.     [provider]  vitamin E 400 UNIT capsule Take 400 Units by mouth every morning.     [provider]   Allergies  Allergen Reactions  . Talwin [Pentazocine] Anaphylaxis and Swelling    Throat swelling  . Valium [Diazepam] Other (See Comments)  Makes her feel like she's flying  . Vicodin [Hydrocodone-Acetaminophen] Nausea And Vomiting  . Adhesive [Tape] Other (See Comments)    Bruising and TEARING!!!  Please use "paper" tape  . Beta Adrenergic Blockers Other (See Comments)    Pt states she not allergic  . Levofloxacin Swelling and Other (See Comments)    Tongue swells   . Simvastatin Swelling and Rash    Mouth swelling    FAMILY HISTORY:  family history includes Breast cancer in her cousin, cousin, and cousin; Cancer in her mother; Diabetes in her mother; Heart attack in her father; Stroke in her mother. SOCIAL HISTORY:  reports that  has never  smoked. she has never used smokeless tobacco. She reports that she does not drink alcohol or use drugs.   REVIEW OF SYSTEMS: Positives in BOLD  Gen: Denies fever, chills, weight change, fatigue, night sweats HEENT: Denies blurred vision, double vision, hearing loss, tinnitus, sinus congestion, rhinorrhea, sore throat, neck stiffness, dysphagia PULM: shortness of breath, cough, sputum production, hemoptysis, wheezing CV: pleuritic chest pain, edema, orthopnea, paroxysmal nocturnal dyspnea, palpitations GI: Denies abdominal pain, nausea, vomiting, diarrhea, hematochezia, melena, constipation, change in bowel habits GU: Denies dysuria, hematuria, polyuria, oliguria, urethral discharge Endocrine: Denies hot or cold intolerance, polyuria, polyphagia or appetite change Derm: Denies rash, dry skin, scaling or peeling skin change Heme: Denies easy bruising, bleeding, bleeding gums Neuro: headache, numbness, left sided weakness weakness, left facial droop, aphasia slurred speech, loss of memory or consciousness  SUBJECTIVE:  Pt states she her chest hurts with breathing   VITAL SIGNS: Temp:  [97.6 F (36.4 C)-98.1 F (36.7 C)] 97.6 F (36.4 C) (12/20 1539) Pulse Rate:  [66-97] 97 (12/20 1539) Resp:  [16-18] 18 (12/20 0504) BP: (98-209)/(46-111) 209/111 (12/20 1544) SpO2:  [94 %-100 %] 97 % (12/20 1544) Weight:  [60.6 kg (133 lb 8 oz)] 60.6 kg (133 lb 8 oz) (12/20 0504)  PHYSICAL EXAMINATION: General: elderly female resting in bed, NAD  Neuro: alert and oriented, follows commands, PERRLA, left sided upper and lower extremity weakness  HEENT:  AT,[place],No JVD  Cardiovascular: s1s2,regular,no m/r/g Lungs: wheezes throughout, even, non labored Abdomen: +BS x4, soft, non tender, non distended  Musculoskeletal: no edema,cyanosis noted Skin: warm,dry and intact  Recent Labs  Lab 08/10/17 1829 08/11/17 0522  NA 136 139  K 4.5 4.6  CL 104 109  CO2 25 24  BUN 17 12  CREATININE 0.57 0.56    GLUCOSE 154* 211*   Recent Labs  Lab 08/10/17 1829 08/11/17 0522  HGB 10.2* 9.9*  HCT 31.4* 30.8*  WBC 4.4 4.3  PLT 180 159   Dg Chest 2 View  Result Date: 08/10/2017 CLINICAL DATA:  Chest pain EXAM: CHEST  2 VIEW COMPARISON:  06/26/2017 FINDINGS: Prior CABG. Heart and mediastinal contours are within normal limits. No focal opacities or effusions. No acute bony abnormality. IMPRESSION: No active cardiopulmonary disease. Electronically Signed   By: Charlett NoseKevin  Dover M.D.   On: 08/10/2017 18:53    ASSESSMENT / PLAN: CVA although unlikely vs. TIA vs. Todds's Paralysis Secondary to Complex Partial Seizure  NSTEMI Hypotension possibly related to cardiogenic shock-resolved Pleuritic Chest Pain  Hx of CHF, Seizures, Hyperlipidemia, and Diabetes [place] Plan: Supplemental O2 for dyspnea and/or hypoxia maintain O2 sats >92% CT Angio Head/Neck , MR MRA Head/Neck, and CT Cerebral Perfusion results pending  Frequent neuro checks  Per Neurology recommendations will administer 1 gram IV Keppra x1 dose then increase EEG pending Keppra to 1 gram q12hrs  Allow for permissive HTN in the setting of possible CVA  Aspiration/Seizure precautions  Neurology consulted appreciate input-per neurology not a candidate to TPA  Continuous telemetry monitoring  Stat troponin and EKG  Cardiology consulted appreciate input  Continue aspirin, atorvastatin, and plavix Blood glucose checks with sliding scale insulin coverage Continue home dose lamictal/keppra Keep NPO for now   Sonda Rumble, [facility]  Pulmonary/Critical Care Pager [phone] (please enter 7 digits) PCCM Consult Pager [phone] (please enter 7 digits)

## 2017-08-12 NOTE — Progress Notes (Signed)
[employer] [facility] [facility] [place] [facility]   PATIENT NAME: Leah Small    MR#:  [id number]  DATE OF BIRTH:  October 09, 1940  SUBJECTIVE:  CHIEF COMPLAINT:   Chief Complaint  Patient presents with  . Chest Pain   Patient was found aphasia, facial droop and left-sided weakness 20 minutes ago. Code stroke was called. REVIEW OF SYSTEMS:  Review of Systems  Unable to perform ROS: Acuity of condition    DRUG ALLERGIES:   Allergies  Allergen Reactions  . Talwin [Pentazocine] Anaphylaxis and Swelling    Throat swelling  . Valium [Diazepam] Other (See Comments)    Makes her feel like she's flying  . Vicodin [Hydrocodone-Acetaminophen] Nausea And Vomiting  . Adhesive [Tape] Other (See Comments)    Bruising and TEARING!!!  Please use "paper" tape  . Beta Adrenergic Blockers Other (See Comments)    Pt states she not allergic  . Levofloxacin Swelling and Other (See Comments)    Tongue swells   . Simvastatin Swelling and Rash    Mouth swelling    VITALS:  Blood pressure (!) 209/111, pulse 97, temperature 97.6 F (36.4 C), temperature source Oral, resp. rate 18, height 5\' 8"  (1.727 m), weight 133 lb 8 oz (60.6 kg), SpO2 97 %.  PHYSICAL EXAMINATION:  Physical Exam  GENERAL:  76 y.o.-year-old patient lying in the bed with no acute distress.  EYES: Pupils equal, round, reactive to light and accommodation. No scleral icterus. Extraocular muscles intact.  HEENT: Head atraumatic, normocephalic.  NECK:  Supple, no jugular venous distention. No thyroid enlargement, no tenderness.  LUNGS: Normal breath sounds bilaterally, no wheezing, rales,rhonchi or crepitation. No use of accessory muscles of respiration.  CARDIOVASCULAR: S1, S2 normal. No  rubs, or gallops. 2/6 systolic murmur present ABDOMEN: Soft, nontender, nondistended. Bowel sounds present. No organomegaly or mass.  EXTREMITIES: No pedal edema, cyanosis, or clubbing.  NEUROLOGIC: The patient is alert and awake,  aphasia and facial droop, left-sided weakness.  PSYCHIATRIC: The patient is alert and awake.  SKIN: No obvious rash, lesion, or ulcer.    LABORATORY PANEL:   CBC Recent Labs  Lab 08/11/17 0522  WBC 4.3  HGB 9.9*  HCT 30.8*  PLT 159   ------------------------------------------------------------------------------------------------------------------  Chemistries  Recent Labs  Lab 08/10/17 1829 08/11/17 0522  NA 136 139  K 4.5 4.6  CL 104 109  CO2 25 24  GLUCOSE 154* 211*  BUN 17 12  CREATININE 0.57 0.56  CALCIUM 9.1 8.3*  AST 24  --   ALT 12*  --   ALKPHOS 50  --   BILITOT 0.6  --    ------------------------------------------------------------------------------------------------------------------  Cardiac Enzymes Recent Labs  Lab 08/11/17 0310  TROPONINI 0.10*   ------------------------------------------------------------------------------------------------------------------  RADIOLOGY:  Dg Chest 2 View  Result Date: 08/10/2017 CLINICAL DATA:  Chest pain EXAM: CHEST  2 VIEW COMPARISON:  06/26/2017 FINDINGS: Prior CABG. Heart and mediastinal contours are within normal limits. No focal opacities or effusions. No acute bony abnormality. IMPRESSION: No active cardiopulmonary disease. Electronically Signed   By: Charlett NoseKevin  Dover M.D.   On: 08/10/2017 18:53    EKG:   Orders placed or performed during the hospital encounter of 08/10/17  . ED EKG  . ED EKG  . EKG 12-Lead  . EKG 12-Lead    ASSESSMENT AND PLAN:   Leah Small  is a 76 y.o. female with a known history of CAD status post CABG several years ago, cardiac stent placement in March 2017, few admissions  for unstable angina after that requiring medical management, hypertension, psoriatic arthritis, diabetes mellitus, complex partial seizures presents from home secondary to sudden onset of chest pain.  1. Unstable angina-was significantly hypotensive with chest pain on admission. Admitted to ICU with ST  depressions.. -Significant known 3 vessel disease. Last cardiac catheterization was in November 2017. Last stent was in March 2017. -Appreciate cardiology consult. Patient has ruled out for MI. -Blood pressure is improving. Started on low-dose Imdur and Ranexa at this time. -Symptoms have improved significantly. No further diagnostic cardiac testing to be performed. -Echocardiogram stable at baseline without any new wall motion changes.. -Continue aspirin and Plavix.   2. Hypotension-could be cardiogenic shock. Improved now. -On low-dose Imdur re-started if she can tolerate. Also continue low dose Toprol at bedtime at discharge. -Also will be and Ranexa. Received IV fluids during this admission.. -Echo with normal ejection fraction.  3. Diabetes mellitus-on Lantus and also sliding scale insulin.  4. 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 Continue Lamictal and Keppra  5. DVT prophylaxis-on lovenox  Acute CVA.  She took aspirin and Plavix this am.  Follow-up CAT scan of the head, tele neurologist for further recommendations for possible TPA.  I discussed with  tele neurologist, Dr. Lewis MoccasinMasud just now. Waiting for CT head and his recommendation. I discussed with Dr. Nemiah CommanderKalisetti and RNs.  CODE STATUS: Full code  TOTAL TIME TAKING CARE OF THIS PATIENT: 52 minutes.   Shaune PollackQing Luvada Salamone M.D on 08/12/2017 at 4:05 PM  Between 7am to 6pm - Pager - [phone]  After 6pm go to [email or web address] - Social research officer, governmentpassword EPAS [facility]  [facility]  Office  [phone]  CC: Primary care physician; Marguarite ArbourSparks, Jeffrey D, MD

## 2017-08-12 NOTE — Progress Notes (Signed)
Informed by RN that after ambulation patient is having trouble moving her left leg and also expressive aphasia. Advised to call code stoke, hospitalist on call will assess the patient. MRI brain, MRA ordered Already on asa and plavix

## 2017-08-12 NOTE — Progress Notes (Addendum)
Initial Nutrition Assessment  DOCUMENTATION CODES:   Non-severe (moderate) malnutrition in context of chronic illness  INTERVENTION:   Ensure Enlive po BID, each supplement provides 350 kcal and 20 grams of protein  Liberalize diet  MVI daily   Bowel regimen as needed  NUTRITION DIAGNOSIS:   Moderate Malnutrition related to chronic illness(heart disease, chronic poor appetite ) as evidenced by moderate fat depletion, moderate muscle depletion.  GOAL:   Patient will meet greater than or equal to 90% of their needs  MONITOR:   PO intake, Supplement acceptance, Labs, Weight trends, I & O's  REASON FOR ASSESSMENT:   Malnutrition Screening Tool    ASSESSMENT:    76 y.o. female with a known history of CAD status post CABG several years ago, cardiac stent placement in March 2017, few admissions for unstable angina after that requiring medical management, hypertension, psoriatic arthritis, diabetes mellitus, complex partial seizures presents from home secondary to sudden onset of chest pain.   Met with pt in room today; pt reports poor appetite and oral intake for 3 months pta. Pt reports that food just does not taste good and she just doesn't want to eat. Pt eats mainly frozen meals at home [employer] brand). Pt does drink one chocolate Ensure per day at home. Pt does not take a daily MVI but reports that she takes B12 and vitamin E. Per chart, pt has lost 12lbs(8%) within the last 6 months; RD is unsure how recent this weight loss was. Pt is unaware that she has lost weight. Pt is currently eating 100% of meals in hospital. RD discussed with pt the importance of adequate protein needed to preserve lean muscle. RD also discussed a low sodium diet and recommended frozen meals that are lower in sodium. RD will order Ensure as pt drinks this at home; can switch to Premier Protein if pt's blood sugars are elevated. Pt noted to have type 1 stool today; recommend bowel regimen as needed  per MD discretion.   Medications reviewed and include: aspirin, lovenox, insulin, senokot, B12, Vit E  Labs reviewed: Ca 8.3(L) Hgb 9.9(L), Hct 30.8(L) cbgs- 154, 211 x 24 hrs AIC 6.7- 11/4  Nutrition-Focused physical exam completed. Findings are moderate fat depletions in chest and upper arms, moderate muscle depletions in clavicles, shoulders, hands, and BLE, and no edema.   Diet Order:  Diet 2 gram sodium Room service appropriate? Yes; Fluid consistency: Thin  EDUCATION NEEDS:   Education needs have been addressed  Skin: Reviewed RN Assessment  Last BM:  12/19- type 1  Height:   Ht Readings from Last 1 Encounters:  08/10/17 '5\' 8"'$  (1.727 m)    Weight:   Wt Readings from Last 1 Encounters:  08/12/17 133 lb 8 oz (60.6 kg)    Ideal Body Weight:  63.6 kg  BMI:  Body mass index is 20.3 kg/m.  Estimated Nutritional Needs:   Kcal:  1400-1600kcal/day   Protein:  72-84g/day   Fluid:  >1.5L/day or per MD  Koleen Distance MS, RD, LDN Pager #[phone] After Hours Pager: [phone]

## 2017-08-12 NOTE — Progress Notes (Signed)
[place], NP notified of patient's elevated troponin 0.14; acknowledged, no new orders. Also, patient has PO medications scheduled and is NPO, will hold PO medications per order.

## 2017-08-12 NOTE — Progress Notes (Addendum)
Patient was ambulated in hall when patient stated she felt like her left arm was giving out on her, patient placed in wheelchair and put back in room. After patient placed in room patient started exhibiting signs of expressive aphasia and left sided weakness. MD was paged and Code Stroke was called, stat MRI and Q2 hour neuro checks ordered.

## 2017-08-12 NOTE — Consult Note (Addendum)
   [facility] TeleNeurology Consult Services  Impression:  Complex partial seizure vs less likely stroke Not a tpa candidate due to: likely seizure. TPA was discussed with her son who is EMT who states that this is typical of her complex partial seizure. Symptoms  are consistent with LVO therefore  CTA Head and neck with CTP recommended.  Comments:   LKW: 15:15 [facility] contacted: 15:50 [facility] at bedside: 15:55 NIHSS assessment time: 16:10  Recommendations:  Ativan 2 mg now. If CTA/CTP does not show any intracranial thrombus or perfusion deficit consistent with stroke she would need EEG and seizure workup and treatment.  Inpatient neurology consultation Discussed with hospitalist  Please call with questions  -----------------------------------------------------------------------------------------  CC: left sided weakness and speech difficulty  History of Present Illness   Patient is a  76 year old female with history of complex partial seizures who was admitted with chest pain and was treated for NSTEMI.  She was on heparin drip that was stopped 2 days ago and she was getting ready to be discharged when she had sudden onset of difficulty talking and left-sided weakness and facial droop.  Her son who is an EMT states that she has a history of complex partial seizures and these are her typical episode of complex partial seizure.  I discussed the possibility of stroke and discussed the treatment option for stroke but states that this is typical of her complex partial seizure   Diagnostic: CT Head: No acute Intracranial findings.  Exam: NIH Stroke Scale/Score (NIHSS)   RESULT SUMMARY: 15 points NIH Stroke Scale   INPUTS: 1A: Level of consciousness -> 0 = Alert; keenly responsive 1B: Ask month and age -> 2 = 0 questions right  1C: 'Blink eyes' & 'squeeze hands' -> 2 = Performs 0 tasks 2: Horizontal extraocular movements -> 0 = Normal 3: Visual fields -> 0  = No visual loss 4: Facial palsy -> 1 = Minor paralysis (flat nasolabial fold, smile asymmetry) 5A: Left arm motor drift -> 2 = Some effort against gravity 5B: Right arm motor drift -> 0 = No drift for 10 seconds 6A: Left leg motor drift -> 4 = No movement 6B: Right leg motor drift -> 0 = No drift for 5 seconds 7: Limb Ataxia -> 0 = Does not understand 8: Sensation -> 0 = Normal; no sensory loss 9: Language/aphasia -> 2 = Severe aphasia: fragmentary expression, inference needed, cannot identify materials 10: Dysarthria -> 2 = Severe dysarthria: unintelligible slurring or out of proportion to dysphasia 11: Extinction/inattention -> 0 = No abnormality   Medical Decision Making:  - Extensive number of diagnosis or management options are considered above.   - Extensive amount of complex data reviewed.   - High risk of complication and/or morbidity or mortality are associated with differential diagnostic considerations above.  - There may be Uncertain outcome and increased probability of prolonged functional impairment or high probability of severe prolonged functional impairment associated with some of these differential diagnosis.  Medical Data Reviewed:  1.Data reviewed include clinical labs, radiology,  Medical Tests;   2.Tests results discussed w/performing or interpreting physician;   3.Obtaining/reviewing old medical records;  4.Obtaining case history from another source;  5.Independent review of image, tracing or specimen.    Patient was informed the Neurology Consult would happen via telehealth (remote video) and consented to receiving care in this manner.

## 2017-08-13 ENCOUNTER — Inpatient Hospital Stay: Payer: Medicare Other

## 2017-08-13 DIAGNOSIS — R569 Unspecified convulsions: Secondary | ICD-10-CM

## 2017-08-13 LAB — GLUCOSE, CAPILLARY
GLUCOSE-CAPILLARY: 225 mg/dL — AB (ref 65–99)
GLUCOSE-CAPILLARY: 289 mg/dL — AB (ref 65–99)
Glucose-Capillary: 150 mg/dL — ABNORMAL HIGH (ref 65–99)
Glucose-Capillary: 247 mg/dL — ABNORMAL HIGH (ref 65–99)

## 2017-08-13 MED ORDER — LEVETIRACETAM 750 MG PO TABS
750.0000 mg | ORAL_TABLET | Freq: Two times a day (BID) | ORAL | Status: DC
  Administered 2017-08-13 – 2017-08-14 (×3): 750 mg via ORAL
  Filled 2017-08-13 (×4): qty 1

## 2017-08-13 MED ORDER — LAMOTRIGINE 25 MG PO TABS
75.0000 mg | ORAL_TABLET | Freq: Two times a day (BID) | ORAL | Status: DC
Start: 2017-08-13 — End: 2017-08-14
  Administered 2017-08-13 – 2017-08-14 (×2): 75 mg via ORAL
  Filled 2017-08-13 (×3): qty 3

## 2017-08-13 MED ORDER — INSULIN GLARGINE 100 UNIT/ML SOLOSTAR PEN: 12.0000 [IU] | PEN_INJECTOR | Freq: Every day | SUBCUTANEOUS | 11 refills | Status: DC

## 2017-08-13 NOTE — Progress Notes (Signed)
[facility] [place] [facility]   PATIENT NAME: Leah Small    MR#:  [id number]  DATE OF BIRTH:  1941-02-08  SUBJECTIVE:  CHIEF COMPLAINT:   Chief Complaint  Patient presents with  . Chest Pain   - Code stroke was called on this patient yesterday afternoon for left-sided weakness and speech changes. However son confirmed that patient has complex partial seizures which may make a stroke. -This morning speech is back to normal. Very minimal left-sided weakness is present. -Patient is hoping to go home today  REVIEW OF SYSTEMS:  Review of Systems  Constitutional: Negative for chills, fever and malaise/fatigue.  HENT: Negative for congestion, hearing loss and nosebleeds.   Eyes: Negative for blurred vision and double vision.  Respiratory: Negative for cough, shortness of breath and wheezing.   Cardiovascular: Positive for chest pain. Negative for palpitations and leg swelling.  Gastrointestinal: Negative for abdominal pain, constipation, diarrhea, nausea and vomiting.  Genitourinary: Negative for dysuria and urgency.  Musculoskeletal: Negative for myalgias.  Neurological: Positive for speech change and focal weakness. Negative for dizziness, sensory change, seizures and headaches.  Psychiatric/Behavioral: Negative for depression.    DRUG ALLERGIES:   Allergies  Allergen Reactions  . Talwin [Pentazocine] Anaphylaxis and Swelling    Throat swelling  . Valium [Diazepam] Other (See Comments)    Makes her feel like she's flying  . Vicodin [Hydrocodone-Acetaminophen] Nausea And Vomiting  . Adhesive [Tape] Other (See Comments)    Bruising and TEARING!!!  Please use "paper" tape  . Beta Adrenergic Blockers Other (See Comments)    Pt states she not allergic  . Levofloxacin Swelling and Other (See Comments)    Tongue swells   . Simvastatin Swelling and Rash    Mouth swelling    VITALS:  Blood pressure (!) 92/50, pulse (!) 58, temperature 97.9 F (36.6  C), temperature source Oral, resp. rate 13, height 5\' 8"  (1.727 m), weight 60.1 kg (132 lb 7.9 oz), SpO2 99 %.  PHYSICAL EXAMINATION:  Physical Exam  GENERAL:  76 y.o.-year-old patient lying in the bed with no acute distress.  EYES: Pupils equal, round, reactive to light and accommodation. No scleral icterus. Extraocular muscles intact.  HEENT: Head atraumatic, normocephalic. Oropharynx and nasopharynx clear.  NECK:  Supple, no jugular venous distention. No thyroid enlargement, no tenderness.  LUNGS: Normal breath sounds bilaterally, no wheezing, rales,rhonchi or crepitation. No use of accessory muscles of respiration.  CARDIOVASCULAR: S1, S2 normal. No  rubs, or gallops. 2/6 systolic murmur present ABDOMEN: Soft, nontender, nondistended. Bowel sounds present. No organomegaly or mass.  EXTREMITIES: No pedal edema, cyanosis, or clubbing.  NEUROLOGIC: Cranial nerves II through XII are intact. Muscle strength 5/5 in all extremities , minimal left sided drift is noted on exam. Sensation intact. Gait not checked.  PSYCHIATRIC: The patient is alert and oriented x 3.  SKIN: No obvious rash, lesion, or ulcer.    LABORATORY PANEL:   CBC Recent Labs  Lab 08/12/17 1620  WBC 5.2  HGB 10.9*  HCT 33.4*  PLT 165   ------------------------------------------------------------------------------------------------------------------  Chemistries  Recent Labs  Lab 08/12/17 1620  NA 134*  K 4.1  CL 103  CO2 23  GLUCOSE 211*  BUN 14  CREATININE 0.68  CALCIUM 8.6*  AST 28  ALT 17  ALKPHOS 54  BILITOT 0.5   ------------------------------------------------------------------------------------------------------------------  Cardiac Enzymes Recent Labs  Lab 08/12/17 1620  TROPONINI 0.14*   ------------------------------------------------------------------------------------------------------------------  RADIOLOGY:  Ct Angio Head W Or Wo  Contrast  Result Date: 08/12/2017 CLINICAL  DATA:  Left-sided weakness.  Aphasia. EXAM: CT ANGIOGRAPHY HEAD AND NECK CT PERFUSION BRAIN TECHNIQUE: Multidetector CT imaging of the head and neck was performed using the standard protocol during bolus administration of intravenous contrast. Multiplanar CT image reconstructions and MIPs were obtained to evaluate the vascular anatomy. Carotid stenosis measurements (when applicable) are obtained utilizing NASCET criteria, using the distal internal carotid diameter as the denominator. Multiphase CT imaging of the brain was performed following IV bolus contrast injection. Subsequent parametric perfusion maps were calculated using RAPID software. CONTRAST:  100mL ISOVUE-370 IOPAMIDOL (ISOVUE-370) INJECTION 76% COMPARISON:  Head MRI/MRA 07/06/2015.  Neck CTA 08/10/2010. FINDINGS: CTA NECK FINDINGS Aortic arch: Standard 3 vessel aortic arch with mild atherosclerotic plaque. Widely patent arch vessel origins. Calcified plaque at the subclavian artery origins has mildly increased from 2011 but does not result in significant stenosis. Right carotid system: Patent with evidence of prior endarterectomy. Increased intimal thickening and new calcified plaque in the distal common carotid artery result in less than 50% stenosis. There is new mild calcified plaque in the proximal ICA without stenosis. Progressive calcified plaque in the distal cervical ICA does not result in significant stenosis. Left carotid system: Patent with evidence of interval endarterectomy since the 2011 CTA. Intimal thickening and noncalcified plaque in the proximal ICA with mild luminal irregularity and possible tiny foci of plaque ulceration. No stenosis. Vertebral arteries: Patent and codominant with mild scattered atherosclerosis but no significant stenosis. Skeleton: Cervical spondylosis.  No suspicious osseous lesion. Other neck: No mass or lymph node enlargement. Upper chest: Clear lung apices. Review of the MIP images confirms the above findings  CTA HEAD FINDINGS Anterior circulation: The internal carotid arteries are patent from skullbase to carotid termini. Siphon atherosclerosis bilaterally results in at most mild left cavernous and bilateral supraclinoid stenosis. ACAs and MCAs are patent without evidence of proximal branch occlusion or significant proximal stenosis. No aneurysm. Posterior circulation: The intracranial vertebral arteries are widely patent to the basilar. Patent bilateral PICA, right AICA, and bilateral SCA origins are identified. The basilar artery is patent with punctate focal calcified plaque at the vertebrobasilar junction which does not result in significant stenosis. Posterior communicating arteries are not identified. The PCAs are patent with a severe focal proximal P1 stenosis on the left which has increased from the 2016 MRA. The left PCA is patent distal to this. No significant proximal right PCA stenosis is seen. No aneurysm. Venous sinuses: Patent. Anatomic variants: None. Delayed phase: Not performed. Review of the MIP images confirms the above findings CT Brain Perfusion Findings: CBF (<30%) Volume:  0 mL Perfusion (Tmax>6.0s) volume:  0 mL Mismatch Volume: n/a Infarction Location: n/a IMPRESSION: 1. No emergent large vessel occlusion. 2. Severe proximal left PCA stenosis, progressed from 2016. 3. No flow limiting proximal anterior circulation stenosis. 4. Prior bilateral carotid endarterectomy. Right greater than left cervical carotid artery atherosclerosis without significant stenosis. 5.  Aortic Atherosclerosis (ICD10-I70.0). 6. No evidence of acute infarct or significant oligemia on CT perfusion imaging. These results were called by telephone at the time of interpretation on 08/12/2017 at 4:39 pm to Dr. Shaune PollackQING CHEN , who verbally acknowledged these results. Electronically Signed   By: Sebastian AcheAllen  Grady M.D.   On: 08/12/2017 16:55   Ct Angio Neck W Or Wo Contrast  Result Date: 08/12/2017 CLINICAL DATA:  Left-sided  weakness.  Aphasia. EXAM: CT ANGIOGRAPHY HEAD AND NECK CT PERFUSION BRAIN TECHNIQUE: Multidetector CT imaging of the head and neck  was performed using the standard protocol during bolus administration of intravenous contrast. Multiplanar CT image reconstructions and MIPs were obtained to evaluate the vascular anatomy. Carotid stenosis measurements (when applicable) are obtained utilizing NASCET criteria, using the distal internal carotid diameter as the denominator. Multiphase CT imaging of the brain was performed following IV bolus contrast injection. Subsequent parametric perfusion maps were calculated using RAPID software. CONTRAST:  ISOVUE-370 IOPAMIDOL (ISOVUE-370) INJECTION 76% COMPARISON:  Head MRI/MRA 07/06/2015.  Neck CTA 08/10/2010. FINDINGS: CTA NECK FINDINGS Aortic arch: Standard 3 vessel aortic arch with mild atherosclerotic plaque. Widely patent arch vessel origins. Calcified plaque at the subclavian artery origins has mildly increased from 2011 but does not result in significant stenosis. Right carotid system: Patent with evidence of prior endarterectomy. Increased intimal thickening and new calcified plaque in the distal common carotid artery result in less than 50% stenosis. There is new mild calcified plaque in the proximal ICA without stenosis. Progressive calcified plaque in the distal cervical ICA does not result in significant stenosis. Left carotid system: Patent with evidence of interval endarterectomy since the 2011 CTA. Intimal thickening and noncalcified plaque in the proximal ICA with mild luminal irregularity and possible tiny foci of plaque ulceration. No stenosis. Vertebral arteries: Patent and codominant with mild scattered atherosclerosis but no significant stenosis. Skeleton: Cervical spondylosis.  No suspicious osseous lesion. Other neck: No mass or lymph node enlargement. Upper chest: Clear lung apices. Review of the MIP images confirms the above findings CTA HEAD FINDINGS  Anterior circulation: The internal carotid arteries are patent from skullbase to carotid termini. Siphon atherosclerosis bilaterally results in at most mild left cavernous and bilateral supraclinoid stenosis. ACAs and MCAs are patent without evidence of proximal branch occlusion or significant proximal stenosis. No aneurysm. Posterior circulation: The intracranial vertebral arteries are widely patent to the basilar. Patent bilateral PICA, right AICA, and bilateral SCA origins are identified. The basilar artery is patent with punctate focal calcified plaque at the vertebrobasilar junction which does not result in significant stenosis. Posterior communicating arteries are not identified. The PCAs are patent with a severe focal proximal P1 stenosis on the left which has increased from the 2016 MRA. The left PCA is patent distal to this. No significant proximal right PCA stenosis is seen. No aneurysm. Venous sinuses: Patent. Anatomic variants: None. Delayed phase: Not performed. Review of the MIP images confirms the above findings CT Brain Perfusion Findings: CBF (<30%) Volume:  0 mL Perfusion (Tmax>6.0s) volume:  0 mL Mismatch Volume: n/a Infarction Location: n/a IMPRESSION: 1. No emergent large vessel occlusion. 2. Severe proximal left PCA stenosis, progressed from 2016. 3. No flow limiting proximal anterior circulation stenosis. 4. Prior bilateral carotid endarterectomy. Right greater than left cervical carotid artery atherosclerosis without significant stenosis. 5.  Aortic Atherosclerosis (ICD10-I70.0). 6. No evidence of acute infarct or significant oligemia on CT perfusion imaging. These results were called by telephone at the time of interpretation on 08/12/2017 at 4:39 pm to Dr. Shaune Pollack , who verbally acknowledged these results. Electronically Signed   By: Sebastian Ache M.D.   On: 08/12/2017 16:55   Ct Cerebral Perfusion W Contrast  Result Date: 08/12/2017 CLINICAL DATA:  Left-sided weakness.  Aphasia.  EXAM: CT ANGIOGRAPHY HEAD AND NECK CT PERFUSION BRAIN TECHNIQUE: Multidetector CT imaging of the head and neck was performed using the standard protocol during bolus administration of intravenous contrast. Multiplanar CT image reconstructions and MIPs were obtained to evaluate the vascular anatomy. Carotid stenosis measurements (when applicable) are obtained utilizing  NASCET criteria, using the distal internal carotid diameter as the denominator. Multiphase CT imaging of the brain was performed following IV bolus contrast injection. Subsequent parametric perfusion maps were calculated using RAPID software. CONTRAST:  ISOVUE-370 IOPAMIDOL (ISOVUE-370) INJECTION 76% COMPARISON:  Head MRI/MRA 07/06/2015.  Neck CTA 08/10/2010. FINDINGS: CTA NECK FINDINGS Aortic arch: Standard 3 vessel aortic arch with mild atherosclerotic plaque. Widely patent arch vessel origins. Calcified plaque at the subclavian artery origins has mildly increased from 2011 but does not result in significant stenosis. Right carotid system: Patent with evidence of prior endarterectomy. Increased intimal thickening and new calcified plaque in the distal common carotid artery result in less than 50% stenosis. There is new mild calcified plaque in the proximal ICA without stenosis. Progressive calcified plaque in the distal cervical ICA does not result in significant stenosis. Left carotid system: Patent with evidence of interval endarterectomy since the 2011 CTA. Intimal thickening and noncalcified plaque in the proximal ICA with mild luminal irregularity and possible tiny foci of plaque ulceration. No stenosis. Vertebral arteries: Patent and codominant with mild scattered atherosclerosis but no significant stenosis. Skeleton: Cervical spondylosis.  No suspicious osseous lesion. Other neck: No mass or lymph node enlargement. Upper chest: Clear lung apices. Review of the MIP images confirms the above findings CTA HEAD FINDINGS Anterior circulation:  The internal carotid arteries are patent from skullbase to carotid termini. Siphon atherosclerosis bilaterally results in at most mild left cavernous and bilateral supraclinoid stenosis. ACAs and MCAs are patent without evidence of proximal branch occlusion or significant proximal stenosis. No aneurysm. Posterior circulation: The intracranial vertebral arteries are widely patent to the basilar. Patent bilateral PICA, right AICA, and bilateral SCA origins are identified. The basilar artery is patent with punctate focal calcified plaque at the vertebrobasilar junction which does not result in significant stenosis. Posterior communicating arteries are not identified. The PCAs are patent with a severe focal proximal P1 stenosis on the left which has increased from the 2016 MRA. The left PCA is patent distal to this. No significant proximal right PCA stenosis is seen. No aneurysm. Venous sinuses: Patent. Anatomic variants: None. Delayed phase: Not performed. Review of the MIP images confirms the above findings CT Brain Perfusion Findings: CBF (<30%) Volume:  0 mL Perfusion (Tmax>6.0s) volume:  0 mL Mismatch Volume: n/a Infarction Location: n/a IMPRESSION: 1. No emergent large vessel occlusion. 2. Severe proximal left PCA stenosis, progressed from 2016. 3. No flow limiting proximal anterior circulation stenosis. 4. Prior bilateral carotid endarterectomy. Right greater than left cervical carotid artery atherosclerosis without significant stenosis. 5.  Aortic Atherosclerosis (ICD10-I70.0). 6. No evidence of acute infarct or significant oligemia on CT perfusion imaging. These results were called by telephone at the time of interpretation on 08/12/2017 at 4:39 pm to Dr. Shaune Pollack , who verbally acknowledged these results. Electronically Signed   By: Sebastian Ache M.D.   On: 08/12/2017 16:55   Ct Head Code Stroke Wo Contrast`  Result Date: 08/12/2017 CLINICAL DATA:  Code stroke.  Left-sided weakness.  Aphasia. EXAM: CT  HEAD WITHOUT CONTRAST TECHNIQUE: Contiguous axial images were obtained from the base of the skull through the vertex without intravenous contrast. COMPARISON:  03/29/2016 FINDINGS: Brain: There is no evidence of acute infarct, intracranial hemorrhage, mass, midline shift, or extra-axial fluid collection. There is mild cerebral atrophy. Periventricular white matter hypodensities are nonspecific but compatible with mild chronic small vessel ischemic disease. Vascular: Calcified atherosclerosis at the skullbase. No hyperdense vessel. Skull: No fracture or focal osseous lesion.  Sinuses/Orbits: Mild right and moderate left ethmoid air cell mucosal thickening. Clear mastoid air cells. Bilateral cataract extraction. Other: None. ASPECTS [facility]([place] Stroke Program Early CT Score) - Ganglionic level infarction (caudate, lentiform nuclei, internal capsule, insula, M1-M3 cortex): 7 - Supraganglionic infarction (M4-M6 cortex): 3 Total score (0-10 with 10 being normal): 10 IMPRESSION: 1. No evidence of acute intracranial abnormality. 2. ASPECTS is 10. Electronically Signed   By: Sebastian AcheAllen  Grady M.D.   On: 08/12/2017 16:21    EKG:   Orders placed or performed during the hospital encounter of 08/10/17  . ED EKG  . ED EKG  . EKG 12-Lead  . EKG 12-Lead  . EKG 12-Lead  . EKG 12-Lead    ASSESSMENT AND PLAN:   Lorri FrederickDonnie Kaltenbach  is a 76 y.o. female with a known history of CAD status post CABG several years ago, cardiac stent placement in March 2017, few admissions for unstable angina after that requiring medical management, hypertension, psoriatic arthritis, diabetes mellitus, complex partial seizures presents from home secondary to sudden onset of chest pain.  1. Unstable angina-was significantly hypotensive with chest pain on admission. Admitted to ICU with ST depressions.. -Significant known 3 vessel disease. Last cardiac catheterization was in November 2017. Last stent was in March 2017. -Appreciate cardiology consult.  Patient has ruled out for MI. -Blood pressure is improving. on low-dose Imdur and Ranexa at this time. -Symptoms have improved significantly. No further diagnostic cardiac testing to be performed. -Echocardiogram stable at baseline without any new wall motion changes.. -Continue aspirin and Plavix.  - Patient has occasional chest pain episodes from her stable angina.  2. Left-sided weakness and aphasia-TIA versus complex partial seizure. -Back to baseline. Ambulate today. -Neurology consult. Await to hear from neurology to see if the patient will need an MRI prior to discharge.  3. Hypotension-could be cardiogenic shock. Improved now. -On low-dose Imdur re-started if she can tolerate. Also continue low dose Toprol at bedtime at discharge. -on Ranexa.  -Echo with normal ejection fraction.  3. Diabetes mellitus-on Lantus and also sliding scale insulin.  4. 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 Continue Lamictal and Keppra  5. DVT prophylaxis-on lovenox   Encourage ambulation again today.   All the records are reviewed and case discussed with Care Management/Social Workerr. Management plans discussed with the patient, family and they are in agreement.  CODE STATUS: Full code  TOTAL TIME TAKING CARE OF THIS PATIENT: 37 minutes.   POSSIBLE D/C TODAY OR TOMORROW, DEPENDING ON CLINICAL CONDITION.   Enid BaasKALISETTI,[place] Sorn M.D on 08/13/2017 at 9:19 AM  Between 7am to 6pm - Pager - [phone]  After 6pm go to [email or web address] - Social research officer, governmentpassword EPAS [facility]  [facility]  Office  [phone]  CC: Primary care physician; Marguarite ArbourSparks, Jeffrey D, MD

## 2017-08-13 NOTE — Progress Notes (Signed)
Spoke with Annabelle Harmanana, NP about patient traveling down to MRI. Per Annabelle Harmanana, NP patient could travel to MRI without cardiac monitoring and RN did not have to travel with patient. Notified MRI.

## 2017-08-13 NOTE — Evaluation (Addendum)
Physical Therapy Evaluation Patient Details Name: Leah FishmanDonnie R Baranowski MRN: [id number] DOB: 09-Jun-1941 Today's Date: 08/13/2017   History of Present Illness  Pt is a [age] y.o. female presenting to hospital with chest pain radiating to jaw 08/10/17 and admitted with unstable angina.  08/12/17 code stroke called secondary pt developing L sided weakness, aphasia, and L facial droop when ambulating and transferred to CCU (new PT order received to see pt after transfer to CCU).  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  Clinical Impression  Prior to hospital admission, pt was modified independent ambulating with RW (started using RW about 2 weeks ago; [facility]).  Pt's son and girlfriend live with pt in 1 level home with ramp to enter.  Currently pt is modified independent with bed mobility; CGA with transfers; and CGA ambulating 100 feet with RW (initially pt with significant slower cadence and very "stiff" appearing gait and decreased L LE foot clearance but improved gait mechanics noted with distance).  Pt reports being at baseline L sided weakness and impaired L sided sensation but is no longer able to perform L LE heel to shin coordination like she used to.  Pt would benefit from skilled PT to address noted impairments and functional limitations (see below for any additional details).  Upon hospital discharge, recommend pt discharge to home with HHPT.    Follow Up Recommendations Home health PT    Equipment Recommendations  Rolling walker with 5" wheels    Recommendations for Other Services       Precautions / Restrictions Precautions Precautions: Fall Restrictions Weight Bearing Restrictions: No      Mobility  Bed Mobility Overal bed mobility: Modified  Independent             General bed mobility comments: Supine to/from sit with mild increased effort but no assist required; HOB elevated  Transfers Overall transfer level: Needs assistance Equipment used: Rolling walker (2 wheeled) Transfers: Sit to/from Stand Sit to Stand: Min guard         General transfer comment: initial increased time and effort to perform but steady with use of RW  Ambulation/Gait Ambulation/Gait assistance: Min guard Ambulation Distance (Feet): 100 Feet Assistive device: Rolling walker (2 wheeled)       General Gait Details: initially pt with significant slower cadence and very "stiff" appearing gait and decreased L LE foot clearance but improved gait mechanics noted with distance (not "stiff" appearing any more with improved L foot clearance and mild improved cadence)  Stairs            Wheelchair Mobility    Modified Rankin (Stroke Patients Only)       Balance Overall balance assessment: Needs assistance Sitting-balance support: No upper extremity supported;Feet supported Sitting balance-Leahy Scale: Normal Sitting balance - Comments: steady sitting reaching outside BOS   Standing balance support: Single extremity supported Standing balance-Leahy Scale: Poor Standing balance comment: requires at least single UE support for static standing balance                             Pertinent Vitals/Pain Pain Assessment: No/denies pain  Vitals (HR and O2 on room air) stable and WFL throughout treatment session.  BP 141/71 at rest beginning of session;  increased to 170/90 post ambulation; and decreased to 167/82 after a few minutes of rest (nursing notified of pt's vitals).    Home Living Family/patient expects to be discharged to:: Private residence Living Arrangements: Children(Pt's son, girlfriend, and dog (boxer)) Available Help at Discharge: Family;Available PRN/intermittently Type of Home: House Home Access: Ramped  entrance     Home Layout: One level Home Equipment: Walker - 4 wheels;Cane - single point;Shower seat;Grab bars - tub/shower      Prior Function Level of Independence: Independent with assistive device(s)         Comments: Last 2 weeks pt ambulating with RW (prior to that pt was ambulating with SPC).  Pt's son and girlfriend work 8am-6pm (pt home alone during this time).     Hand Dominance        Extremity/Trunk Assessment   Upper Extremity Assessment Upper Extremity Assessment: (R UE WFL; fair L hand squeeze; at least 3+/5 L shoulder flexion; 4/5 L elbow flexion; pt with difficulty initiating movement to perform L elbow extension--pt reports baseline for her (able to perform with AAROM and tactile cues))    Lower Extremity Assessment Lower Extremity Assessment: RLE deficits/detail;LLE deficits/detail(B LE proprioception intact; decreased L LE sensation compared to R (pt reports this is baseline for her)) RLE Deficits / Details: strength and ROM WFL LLE Deficits / Details: unable to perform L heel to shin (difficulty initiating movement); hip flexion 4/5; knee flexion at least 4/5; knee extension pt with difficulty initiating movement but eventually able to straighten with increased time and effort (shakiness noted in movement--pt reports baseline for her); DF at least 4/5    Cervical / Trunk Assessment Cervical / Trunk Assessment: Normal  Communication   Communication: No difficulties  Cognition Arousal/Alertness: Awake/alert Behavior During Therapy: WFL for tasks assessed/performed Overall Cognitive Status: Within Functional Limits for tasks assessed                                        General Comments General comments (skin integrity, edema, etc.): Pt sitting on edge of bed upon PT arrival.  Nursing cleared pt for participation in physical therapy.  Pt agreeable to PT session.    Exercises     Assessment/Plan    PT Assessment Patient needs  continued PT services  PT Problem List Decreased strength;Decreased balance;Decreased mobility       PT Treatment Interventions DME instruction;Gait training;Functional mobility training;Therapeutic activities;Therapeutic exercise;Balance training;Patient/family education    PT Goals (Current goals can be found in the Care Plan section)  Acute Rehab PT Goals Patient Stated Goal: to go home PT Goal Formulation: With patient Time For Goal Achievement: 08/27/17 Potential to Achieve Goals: Good    Frequency Min 2X/week   Barriers to discharge        Co-evaluation               AM-PAC PT "6 Clicks" Daily Activity  Outcome Measure Difficulty turning over in bed (including adjusting bedclothes, sheets and blankets)?: None Difficulty moving from lying on back to sitting on the side of the bed? : A Little Difficulty sitting down on and standing up from a chair with arms (e.g., wheelchair, bedside commode, etc,.)?: Unable Help needed moving to and from a bed to chair (including a wheelchair)?: A Little Help needed walking in hospital room?: A Little Help needed climbing 3-5 steps with a railing? : A Lot 6 Click  Score: 16    End of Session Equipment Utilized During Treatment: Gait belt Activity Tolerance: Patient tolerated treatment well Patient left: in bed;with call bell/phone within reach;with bed alarm set Nurse Communication: Mobility status;Precautions;Other (comment)(Pt's vitals during session) PT Visit Diagnosis: Other abnormalities of gait and mobility (R26.89);Muscle weakness (generalized) (M62.81)    Time: 1235-1300 PT Time Calculation (min) (ACUTE ONLY): 25 min   Charges:   PT Evaluation $PT Eval Low Complexity: 1 Low PT Treatments $Therapeutic Exercise: 8-22 mins   PT G Codes:   PT G-Codes **NOT FOR INPATIENT CLASS** Functional Assessment Tool Used: AM-PAC 6 Clicks Basic Mobility Functional Limitation: Mobility: Walking and moving around Mobility: Walking  and Moving Around Current Status ([id number]([id number]): At least 40 percent but less than 60 percent impaired, limited or restricted Mobility: Walking and Moving Around Goal Status [phone]): 0 percent impaired, limited or restricted    Hendricks Limesmily Caili Escalera, PT 08/13/17, 1:40 PM [phone]   Addendum:  Pt discussed with MD Kalisetti this morning and MD cleared pt for physical therapy (MD placed new PT order s/p transfer to CCU). Hendricks LimesEmily Sueko Dimichele, PT 08/13/17, 1:46 PM [phone]

## 2017-08-13 NOTE — Consult Note (Signed)
Reason for Consult:Left sided weakness and difficulty with speech Referring Physician: Nemiah Commander  ZO:[id number] sided weakness and difficulty with speech   HPI: Leah Small is an 76 y.o. female admitted with CP on 12/18.  On yesterday had acute onset of difficulty with speech and left sided weakness.  Code stroke was called.  From review of the chart it appears that the patient is being followed for seizure activity with this presentation.  Patient on Keppra and Lamictal.  Patient has had side effects on higher doses of Lamictal and is in the process of having her Lamictal doses increased.  She reports that her last seizure was about 3 months ago.    Past Medical History:  Diagnosis Date  . Acid reflux   . Arthritis    "all over"  . Chronic stable angina (HCC)   . Coronary artery disease   . Hypercholesterolemia   . Hypertension   . Myocardial infarction (HCC) 1991; 07/06/2015  . NSTEMI (non-ST elevated myocardial infarction) (HCC) 10/22/2015  . Psoriatic arthritis (HCC)   . 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 TIA (transient ischemic attack)    "several over the last 20 years" (10/22/2015)  . Type II diabetes mellitus (HCC)     Past Surgical History:  Procedure Laterality Date  . ABDOMINAL HYSTERECTOMY  1973  . APPENDECTOMY  1950s   elementary school  . CARDIAC CATHETERIZATION  X 2-3  . CARDIAC CATHETERIZATION N/A 11/21/2015   Procedure: Left Heart Cath and Coronary Angiography;  Surgeon: Corky Crafts, MD;  Location: [facility] INVASIVE CV LAB;  Service: Cardiovascular;  Laterality: N/A;  . CARDIAC CATHETERIZATION  11/21/2015   Procedure: Coronary Stent Intervention;  Surgeon: Corky Crafts, MD;  Location: [facility] INVASIVE CV LAB;  Service: Cardiovascular;;  . CARDIAC CATHETERIZATION N/A 01/29/2016   Procedure: Left Heart Cath and Cors/Grafts Angiography;  Surgeon: Tonny Bollman, MD;  Location: [facility] INVASIVE CV LAB;   Service: Cardiovascular;  Laterality: N/A;  . CARDIAC CATHETERIZATION N/A 07/22/2016   Procedure: Left Heart Cath and Coronary Angiography;  Surgeon: Iran Ouch, MD;  Location: [facility] INVASIVE CV LAB;  Service: Cardiovascular;  Laterality: N/A;  . CAROTID STENT INSERTION Bilateral 2011-2012   right-left  . CATARACT EXTRACTION W/ INTRAOCULAR LENS  IMPLANT, BILATERAL Bilateral 2009-2010  . CHOLECYSTECTOMY OPEN  ~ 2014  . CORONARY ANGIOPLASTY    . CORONARY ANGIOPLASTY WITH STENT PLACEMENT  11/21/2015  . CORONARY ARTERY BYPASS GRAFT  08/1995  . DILATION AND CURETTAGE OF UTERUS  X 1  . 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 TONSILLECTOMY  ~ 1947   2nd grade    Family History  Problem Relation Age of Onset  . Heart attack Father   . Diabetes Mother   . Cancer Mother   . Stroke Mother   . Breast cancer Cousin   . Breast cancer Cousin   . Breast cancer Cousin     Social History:  reports that  has never smoked. she has never used smokeless tobacco. She reports that she does not drink alcohol or use drugs.  Allergies  Allergen Reactions  . Talwin [Pentazocine] Anaphylaxis and Swelling    Throat swelling  . Valium [Diazepam] Other (See Comments)    Makes her feel like she's flying  . Vicodin [Hydrocodone-Acetaminophen] Nausea And Vomiting  . Adhesive [Tape] Other (See Comments)    Bruising and TEARING!!!  Please use "paper" tape  . Beta Adrenergic Blockers Other (See Comments)    Pt states she not allergic  . Levofloxacin Swelling and Other (See Comments)    Tongue swells   . Simvastatin Swelling and Rash    Mouth swelling    Medications:  I have reviewed the patient's current medications. Prior to Admission:  Medications Prior to Admission  Medication Sig Dispense Refill Last Dose  . Adalimumab (HUMIRA PEN [place]) Inject 1 application into the skin See admin instructions. Patient takes it twice monthly.The next one is due  June 26th   Past Week at Unknown time  . aspirin EC 81 MG tablet Take 81 mg by mouth at bedtime.   08/10/2017 at Unknown time  . atorvastatin (LIPITOR) 80 MG tablet Take 1 tablet (80 mg total) by mouth at bedtime. (Patient taking differently: Take 40 mg by mouth at bedtime. ) 30 tablet 3 08/10/2017 at Unknown time  . bimatoprost (LUMIGAN) 0.01 % SOLN Place 1 drop into both eyes at bedtime.   08/10/2017 at Unknown time  . gabapentin (NEURONTIN) 100 MG capsule Take 100-200 mg by mouth daily as needed.   prn at prn  . glipiZIDE (GLUCOTROL XL) 10 MG 24 hr tablet Take 10 mg by mouth every morning.   08/10/2017 at Unknown time  . insulin lispro (HUMALOG) 100 UNIT/ML injection Inject 0-6 Units into the skin 3 (three) times daily before meals.    08/10/2017 at Unknown time  . isosorbide mononitrate (IMDUR) 60 MG 24 hr tablet Take 1 tablet (60 mg total) daily by mouth. 30 tablet 0 08/10/2017 at Unknown time  . lamoTRIgine (LAMICTAL) 25 MG tablet Take 50 mg by mouth 2 (two) times daily.   08/10/2017 at Unknown time  . levETIRAcetam (KEPPRA) 750 MG tablet Take 750 mg by mouth 2 (two) times daily.   08/10/2017 at Unknown time  . metFORMIN (GLUCOPHAGE) 1000 MG tablet Take 1 tablet (1,000 mg total) by mouth 2 (two) times daily.   08/10/2017 at Unknown time  . metoprolol tartrate (LOPRESSOR) 25 MG tablet Take 1 tablet (25 mg total) by mouth at bedtime. 30 tablet 12 08/09/2017 at Unknown time  . ranitidine (ZANTAC) 150 MG tablet Take 150 mg by mouth every morning.    08/10/2017 at Unknown time  . ranolazine (RANEXA) 500 MG 12 hr tablet Take 1 tablet by mouth 2 (two) times daily.   08/10/2017 at Unknown time  . timolol (TIMOPTIC) 0.5 % ophthalmic solution Place 1 drop into both eyes 2 (two) times daily.   08/10/2017 at Unknown time  . vitamin B-12 (CYANOCOBALAMIN) 1000 MCG tablet Take 500 mcg by mouth every morning.    08/10/2017 at Unknown time  . vitamin E 400 UNIT capsule Take 400 Units by mouth every morning.     08/10/2017 at Unknown time  . guaiFENesin-dextromethorphan (ROBITUSSIN DM) 100-10 MG/5ML syrup Take 5 mLs every 4 (four) hours as needed by mouth for cough. 118 mL 0 prn at prn  . nitroGLYCERIN (NITRODUR - DOSED IN MG/24 HR) 0.4 mg/hr patch Place 0.4 mg onto the skin daily as needed (for chest pain).    prn at prn  . nitroGLYCERIN (NITROSTAT) 0.4 MG SL tablet Place 0.4 mg under the tongue every 5 (five) minutes as needed for chest pain.   prn at prn  . nystatin cream (MYCOSTATIN) Apply 1 application topically 2 (two) times daily as needed (yeast infection).    prn at prn  . senna-docusate (SENOKOT-S) 8.6-50 MG tablet  Take 1 tablet 2 (two) times daily by mouth. (Patient not taking: Reported on 08/11/2017) 1 tablet  Not Taking at Unknown time  . triamcinolone ointment (KENALOG) 0.1 % Apply 1 application topically 3 (three) times daily as needed (itching from psoriasis).   prn at prn   Scheduled: . aspirin EC  81 mg Oral QHS  . atorvastatin  40 mg Oral QHS  . brimonidine  1 drop Both Eyes BID  . chlorhexidine  15 mL Mouth Rinse BID  . clopidogrel  75 mg Oral BH-q7a  . enoxaparin (LOVENOX) injection  40 mg Subcutaneous Q24H  . feeding supplement (ENSURE ENLIVE)  237 mL Oral BID BM  . insulin aspart  0-5 Units Subcutaneous QHS  . insulin aspart  0-9 Units Subcutaneous TID WC  . insulin glargine  12 Units Subcutaneous QHS  . isosorbide mononitrate  30 mg Oral Daily  . lamoTRIgine  75 mg Oral BID  . levETIRAcetam  750 mg Oral BID  . mouth rinse  15 mL Mouth Rinse q12n4p  . multivitamin with minerals  1 tablet Oral Daily  . ranolazine  500 mg Oral BID  . senna-docusate  1 tablet Oral BID  . sodium chloride flush  3 mL Intravenous Q12H  . timolol  1 drop Both Eyes BID  . vitamin B-12  1,000 mcg Oral BH-q7a  . vitamin E  400 Units Oral BH-q7a    ROS: History obtained from the patient  General ROS: negative for - chills, fatigue, fever, night sweats, weight gain or weight loss Psychological  ROS: depression Ophthalmic ROS: negative for - blurry vision, double vision, eye pain or loss of vision ENT ROS: negative for - epistaxis, nasal discharge, oral lesions, sore throat, tinnitus or vertigo Allergy and Immunology ROS: negative for - hives or itchy/watery eyes Hematological and Lymphatic ROS: negative for - bleeding problems, bruising or swollen lymph nodes Endocrine ROS: negative for - galactorrhea, hair pattern changes, polydipsia/polyuria or temperature intolerance Respiratory ROS: negative for - cough, hemoptysis, shortness of breath or wheezing Cardiovascular ROS: chest pain Gastrointestinal ROS: negative for - abdominal pain, diarrhea, hematemesis, nausea/vomiting or stool incontinence Genito-Urinary ROS: negative for - dysuria, hematuria, incontinence or urinary frequency/urgency Musculoskeletal ROS: negative for - joint swelling or muscular weakness Neurological ROS: as noted in HPI Dermatological ROS: negative for rash and skin lesion changes  Physical Examination: Blood pressure (!) 92/50, pulse (!) 58, temperature 97.9 F (36.6 C), temperature source Oral, resp. rate 13, height 5\' 8"  (1.727 m), weight 60.1 kg (132 lb 7.9 oz), SpO2 99 %.  HEENT-  Normocephalic, no lesions, without obvious abnormality.  Normal external eye and conjunctiva.  Normal TM's bilaterally.  Normal auditory canals and external ears. Normal external nose, mucus membranes and septum.  Normal pharynx. Cardiovascular- S1, S2 normal, pulses palpable throughout   Lungs- chest clear, no wheezing, rales, normal symmetric air entry Abdomen- soft, non-tender; bowel sounds normal; no masses,  no organomegaly Extremities- no edema Lymph-no adenopathy palpable Musculoskeletal-no joint tenderness, deformity or swelling Skin-warm and dry, no hyperpigmentation, vitiligo, or suspicious lesions  Neurological Examination   Mental Status: Alert, oriented, thought content appropriate.  Speech fluent without  evidence of aphasia.  Some minimal word finding difficulties noted.  Able to follow 3 step commands without difficulty. Cranial Nerves: II: Discs flat bilaterally; Visual fields grossly normal, pupils equal, round, reactive to light and accommodation III,IV, VI: ptosis not present, extra-ocular motions intact bilaterally V,VII: smile symmetric, facial light touch sensation decreased on the left  VIII: hearing normal bilaterally IX,X: gag reflex present XI: bilateral shoulder shrug XII: midline tongue extension Motor: Right : Upper extremity   5/5    Left:     Upper extremity   5/5  Lower extremity   5/5     Lower extremity   5-/5 Tone and bulk:normal tone throughout; no atrophy noted Sensory: Pinprick and light touch intact decreased on the left Deep Tendon Reflexes: 2+ and symmetric with absent AJ's bilaterally Plantars: Right: downgoing   Left: downgoing Cerebellar: Normal finger-to-nose testing.  Difficulty with heel-to-shin testing using the LLE Gait: not tested due to safety concerns    Laboratory Studies:   Basic Metabolic Panel: Recent Labs  Lab 08/10/17 1829 08/11/17 0522 08/12/17 1620  NA 136 139 134*  K 4.5 4.6 4.1  CL 104 109 103  CO2 25 24 23   GLUCOSE 154* 211* 211*  BUN 17 12 14   CREATININE 0.57 0.56 0.68  CALCIUM 9.1 8.3* 8.6*    Liver Function Tests: Recent Labs  Lab 08/10/17 1829 08/12/17 1620  AST 24 28  ALT 12* 17  ALKPHOS 50 54  BILITOT 0.6 0.5  PROT 7.3 7.5  ALBUMIN 3.8 3.9   No results for input(s): LIPASE, AMYLASE in the last 168 hours. No results for input(s): AMMONIA in the last 168 hours.  CBC: Recent Labs  Lab 08/10/17 1829 08/11/17 0522 08/12/17 1620  WBC 4.4 4.3 5.2  HGB 10.2* 9.9* 10.9*  HCT 31.4* 30.8* 33.4*  MCV 82.6 83.9 82.2  PLT 180 159 165    Cardiac Enzymes: Recent Labs  Lab 08/10/17 1829 08/11/17 0035 08/11/17 0310 08/12/17 1620  TROPONINI <0.03 0.10* 0.10* 0.14*    BNP: Invalid input(s):  POCBNP  CBG: Recent Labs  Lab 08/12/17 1547 08/12/17 1708 08/12/17 2154 08/13/17 0732 08/13/17 1124  GLUCAP 242* 223* 184* 150* 247*    Microbiology: Results for orders placed or performed during the hospital encounter of 08/10/17  MRSA PCR Screening     Status: None   Collection Time: 08/10/17  9:43 PM  Result Value Ref Range Status   MRSA by PCR NEGATIVE NEGATIVE Final    Comment:        The GeneXpert MRSA Assay (FDA approved for NASAL specimens only), is one component of a comprehensive MRSA colonization surveillance program. It is not intended to diagnose MRSA infection nor to guide or monitor treatment for MRSA infections.     Coagulation Studies: Recent Labs    08/10/17 1829 08/12/17 1620  LABPROT 13.7 12.9  INR 1.06 0.98    Urinalysis: No results for input(s): COLORURINE, LABSPEC, PHURINE, GLUCOSEU, HGBUR, BILIRUBINUR, KETONESUR, PROTEINUR, UROBILINOGEN, NITRITE, LEUKOCYTESUR in the last 168 hours.  Invalid input(s): APPERANCEUR  Lipid Panel:     Component Value Date/Time   CHOL 158 06/26/2017 1608   TRIG 316 (H) 06/26/2017 1608   HDL 48 06/26/2017 1608   CHOLHDL 3.3 06/26/2017 1608   VLDL 63 (H) 06/26/2017 1608   LDLCALC 47 06/26/2017 1608    HgbA1C:  Lab Results  Component Value Date   HGBA1C 6.7 (H) 06/27/2017    Urine Drug Screen:      Component Value Date/Time   LABOPIA NONE DETECTED 03/29/2016 1427   COCAINSCRNUR NONE DETECTED 03/29/2016 1427   LABBENZ NONE DETECTED 03/29/2016 1427   AMPHETMU NONE DETECTED 03/29/2016 1427   THCU NONE DETECTED 03/29/2016 1427   LABBARB NONE DETECTED 03/29/2016 1427    Alcohol Level: No results for input(s): ETH in the last 168  hours.  Other results: EKG: normal sinus rhythm at 89 bpm.  Imaging: Ct Angio Head W Or Wo Contrast  Result Date: 08/12/2017 CLINICAL DATA:  Left-sided weakness.  Aphasia. EXAM: CT ANGIOGRAPHY HEAD AND NECK CT PERFUSION BRAIN TECHNIQUE: Multidetector CT imaging of the  head and neck was performed using the standard protocol during bolus administration of intravenous contrast. Multiplanar CT image reconstructions and MIPs were obtained to evaluate the vascular anatomy. Carotid stenosis measurements (when applicable) are obtained utilizing NASCET criteria, using the distal internal carotid diameter as the denominator. Multiphase CT imaging of the brain was performed following IV bolus contrast injection. Subsequent parametric perfusion maps were calculated using RAPID software. CONTRAST:  100mL ISOVUE-370 IOPAMIDOL (ISOVUE-370) INJECTION 76% COMPARISON:  Head MRI/MRA 07/06/2015.  Neck CTA 08/10/2010. FINDINGS: CTA NECK FINDINGS Aortic arch: Standard 3 vessel aortic arch with mild atherosclerotic plaque. Widely patent arch vessel origins. Calcified plaque at the subclavian artery origins has mildly increased from 2011 but does not result in significant stenosis. Right carotid system: Patent with evidence of prior endarterectomy. Increased intimal thickening and new calcified plaque in the distal common carotid artery result in less than 50% stenosis. There is new mild calcified plaque in the proximal ICA without stenosis. Progressive calcified plaque in the distal cervical ICA does not result in significant stenosis. Left carotid system: Patent with evidence of interval endarterectomy since the 2011 CTA. Intimal thickening and noncalcified plaque in the proximal ICA with mild luminal irregularity and possible tiny foci of plaque ulceration. No stenosis. Vertebral arteries: Patent and codominant with mild scattered atherosclerosis but no significant stenosis. Skeleton: Cervical spondylosis.  No suspicious osseous lesion. Other neck: No mass or lymph node enlargement. Upper chest: Clear lung apices. Review of the MIP images confirms the above findings CTA HEAD FINDINGS Anterior circulation: The internal carotid arteries are patent from skullbase to carotid termini. Siphon  atherosclerosis bilaterally results in at most mild left cavernous and bilateral supraclinoid stenosis. ACAs and MCAs are patent without evidence of proximal branch occlusion or significant proximal stenosis. No aneurysm. Posterior circulation: The intracranial vertebral arteries are widely patent to the basilar. Patent bilateral PICA, right AICA, and bilateral SCA origins are identified. The basilar artery is patent with punctate focal calcified plaque at the vertebrobasilar junction which does not result in significant stenosis. Posterior communicating arteries are not identified. The PCAs are patent with a severe focal proximal P1 stenosis on the left which has increased from the 2016 MRA. The left PCA is patent distal to this. No significant proximal right PCA stenosis is seen. No aneurysm. Venous sinuses: Patent. Anatomic variants: None. Delayed phase: Not performed. Review of the MIP images confirms the above findings CT Brain Perfusion Findings: CBF (<30%) Volume:  0 mL Perfusion (Tmax>6.0s) volume:  0 mL Mismatch Volume: n/a Infarction Location: n/a IMPRESSION: 1. No emergent large vessel occlusion. 2. Severe proximal left PCA stenosis, progressed from 2016. 3. No flow limiting proximal anterior circulation stenosis. 4. Prior bilateral carotid endarterectomy. Right greater than left cervical carotid artery atherosclerosis without significant stenosis. 5.  Aortic Atherosclerosis (ICD10-I70.0). 6. No evidence of acute infarct or significant oligemia on CT perfusion imaging. These results were called by telephone at the time of interpretation on 08/12/2017 at 4:39 pm to Dr. Shaune PollackQING CHEN , who verbally acknowledged these results. Electronically Signed   By: Sebastian AcheAllen  Grady M.D.   On: 08/12/2017 16:55   Ct Angio Neck W Or Wo Contrast  Result Date: 08/12/2017 CLINICAL DATA:  Left-sided weakness.  Aphasia.  EXAM: CT ANGIOGRAPHY HEAD AND NECK CT PERFUSION BRAIN TECHNIQUE: Multidetector CT imaging of the head and neck  was performed using the standard protocol during bolus administration of intravenous contrast. Multiplanar CT image reconstructions and MIPs were obtained to evaluate the vascular anatomy. Carotid stenosis measurements (when applicable) are obtained utilizing NASCET criteria, using the distal internal carotid diameter as the denominator. Multiphase CT imaging of the brain was performed following IV bolus contrast injection. Subsequent parametric perfusion maps were calculated using RAPID software. CONTRAST:  ISOVUE-370 IOPAMIDOL (ISOVUE-370) INJECTION 76% COMPARISON:  Head MRI/MRA 07/06/2015.  Neck CTA 08/10/2010. FINDINGS: CTA NECK FINDINGS Aortic arch: Standard 3 vessel aortic arch with mild atherosclerotic plaque. Widely patent arch vessel origins. Calcified plaque at the subclavian artery origins has mildly increased from 2011 but does not result in significant stenosis. Right carotid system: Patent with evidence of prior endarterectomy. Increased intimal thickening and new calcified plaque in the distal common carotid artery result in less than 50% stenosis. There is new mild calcified plaque in the proximal ICA without stenosis. Progressive calcified plaque in the distal cervical ICA does not result in significant stenosis. Left carotid system: Patent with evidence of interval endarterectomy since the 2011 CTA. Intimal thickening and noncalcified plaque in the proximal ICA with mild luminal irregularity and possible tiny foci of plaque ulceration. No stenosis. Vertebral arteries: Patent and codominant with mild scattered atherosclerosis but no significant stenosis. Skeleton: Cervical spondylosis.  No suspicious osseous lesion. Other neck: No mass or lymph node enlargement. Upper chest: Clear lung apices. Review of the MIP images confirms the above findings CTA HEAD FINDINGS Anterior circulation: The internal carotid arteries are patent from skullbase to carotid termini. Siphon atherosclerosis bilaterally  results in at most mild left cavernous and bilateral supraclinoid stenosis. ACAs and MCAs are patent without evidence of proximal branch occlusion or significant proximal stenosis. No aneurysm. Posterior circulation: The intracranial vertebral arteries are widely patent to the basilar. Patent bilateral PICA, right AICA, and bilateral SCA origins are identified. The basilar artery is patent with punctate focal calcified plaque at the vertebrobasilar junction which does not result in significant stenosis. Posterior communicating arteries are not identified. The PCAs are patent with a severe focal proximal P1 stenosis on the left which has increased from the 2016 MRA. The left PCA is patent distal to this. No significant proximal right PCA stenosis is seen. No aneurysm. Venous sinuses: Patent. Anatomic variants: None. Delayed phase: Not performed. Review of the MIP images confirms the above findings CT Brain Perfusion Findings: CBF (<30%) Volume:  0 mL Perfusion (Tmax>6.0s) volume:  0 mL Mismatch Volume: n/a Infarction Location: n/a IMPRESSION: 1. No emergent large vessel occlusion. 2. Severe proximal left PCA stenosis, progressed from 2016. 3. No flow limiting proximal anterior circulation stenosis. 4. Prior bilateral carotid endarterectomy. Right greater than left cervical carotid artery atherosclerosis without significant stenosis. 5.  Aortic Atherosclerosis (ICD10-I70.0). 6. No evidence of acute infarct or significant oligemia on CT perfusion imaging. These results were called by telephone at the time of interpretation on 08/12/2017 at 4:39 pm to Dr. Shaune Pollack , who verbally acknowledged these results. Electronically Signed   By: Sebastian Ache M.D.   On: 08/12/2017 16:55   Ct Cerebral Perfusion W Contrast  Result Date: 08/12/2017 CLINICAL DATA:  Left-sided weakness.  Aphasia. EXAM: CT ANGIOGRAPHY HEAD AND NECK CT PERFUSION BRAIN TECHNIQUE: Multidetector CT imaging of the head and neck was performed using the  standard protocol during bolus administration of intravenous contrast. Multiplanar CT  image reconstructions and MIPs were obtained to evaluate the vascular anatomy. Carotid stenosis measurements (when applicable) are obtained utilizing NASCET criteria, using the distal internal carotid diameter as the denominator. Multiphase CT imaging of the brain was performed following IV bolus contrast injection. Subsequent parametric perfusion maps were calculated using RAPID software. CONTRAST:  ISOVUE-370 IOPAMIDOL (ISOVUE-370) INJECTION 76% COMPARISON:  Head MRI/MRA 07/06/2015.  Neck CTA 08/10/2010. FINDINGS: CTA NECK FINDINGS Aortic arch: Standard 3 vessel aortic arch with mild atherosclerotic plaque. Widely patent arch vessel origins. Calcified plaque at the subclavian artery origins has mildly increased from 2011 but does not result in significant stenosis. Right carotid system: Patent with evidence of prior endarterectomy. Increased intimal thickening and new calcified plaque in the distal common carotid artery result in less than 50% stenosis. There is new mild calcified plaque in the proximal ICA without stenosis. Progressive calcified plaque in the distal cervical ICA does not result in significant stenosis. Left carotid system: Patent with evidence of interval endarterectomy since the 2011 CTA. Intimal thickening and noncalcified plaque in the proximal ICA with mild luminal irregularity and possible tiny foci of plaque ulceration. No stenosis. Vertebral arteries: Patent and codominant with mild scattered atherosclerosis but no significant stenosis. Skeleton: Cervical spondylosis.  No suspicious osseous lesion. Other neck: No mass or lymph node enlargement. Upper chest: Clear lung apices. Review of the MIP images confirms the above findings CTA HEAD FINDINGS Anterior circulation: The internal carotid arteries are patent from skullbase to carotid termini. Siphon atherosclerosis bilaterally results in at most mild  left cavernous and bilateral supraclinoid stenosis. ACAs and MCAs are patent without evidence of proximal branch occlusion or significant proximal stenosis. No aneurysm. Posterior circulation: The intracranial vertebral arteries are widely patent to the basilar. Patent bilateral PICA, right AICA, and bilateral SCA origins are identified. The basilar artery is patent with punctate focal calcified plaque at the vertebrobasilar junction which does not result in significant stenosis. Posterior communicating arteries are not identified. The PCAs are patent with a severe focal proximal P1 stenosis on the left which has increased from the 2016 MRA. The left PCA is patent distal to this. No significant proximal right PCA stenosis is seen. No aneurysm. Venous sinuses: Patent. Anatomic variants: None. Delayed phase: Not performed. Review of the MIP images confirms the above findings CT Brain Perfusion Findings: CBF (<30%) Volume:  0 mL Perfusion (Tmax>6.0s) volume:  0 mL Mismatch Volume: n/a Infarction Location: n/a IMPRESSION: 1. No emergent large vessel occlusion. 2. Severe proximal left PCA stenosis, progressed from 2016. 3. No flow limiting proximal anterior circulation stenosis. 4. Prior bilateral carotid endarterectomy. Right greater than left cervical carotid artery atherosclerosis without significant stenosis. 5.  Aortic Atherosclerosis (ICD10-I70.0). 6. No evidence of acute infarct or significant oligemia on CT perfusion imaging. These results were called by telephone at the time of interpretation on 08/12/2017 at 4:39 pm to Dr. Shaune Pollack , who verbally acknowledged these results. Electronically Signed   By: Sebastian Ache M.D.   On: 08/12/2017 16:55   Ct Head Code Stroke Wo Contrast`  Result Date: 08/12/2017 CLINICAL DATA:  Code stroke.  Left-sided weakness.  Aphasia. EXAM: CT HEAD WITHOUT CONTRAST TECHNIQUE: Contiguous axial images were obtained from the base of the skull through the vertex without intravenous  contrast. COMPARISON:  03/29/2016 FINDINGS: Brain: There is no evidence of acute infarct, intracranial hemorrhage, mass, midline shift, or extra-axial fluid collection. There is mild cerebral atrophy. Periventricular white matter hypodensities are nonspecific but compatible with mild chronic small  vessel ischemic disease. Vascular: Calcified atherosclerosis at the skullbase. No hyperdense vessel. Skull: No fracture or focal osseous lesion. Sinuses/Orbits: Mild right and moderate left ethmoid air cell mucosal thickening. Clear mastoid air cells. Bilateral cataract extraction. Other: None. ASPECTS [facility] Stroke Program Early CT Score) - Ganglionic level infarction (caudate, lentiform nuclei, internal capsule, insula, M1-M3 cortex): 7 - Supraganglionic infarction (M4-M6 cortex): 3 Total score (0-10 with 10 being normal): 10 IMPRESSION: 1. No evidence of acute intracranial abnormality. 2. ASPECTS is 10. Electronically Signed   By: Sebastian Ache M.D.   On: 08/12/2017 16:21     Assessment/Plan: 76 year old female presenting with chest pain.  Developed difficulty with speech and left sided weakness prior to discharge.  Code stroke was called and patient not administered tPA due to it not being felt that the patient was having a stroke.  Patient on Keppra and Lamictal.  Head CT reviewed and shows no acute changes.  CTA and CTP performed.  No evidence of LVO or perfusion mismatch.  Recommendations: 1.  Continue Keppra at 750mg  BID.  May change back to po 2.  Increase Lamictal to 75mg  BID 3.  PT evaluation 4.  MRI of the brain.  No MR imaging seen since 2016.  If unremarkable, no further work up recommended at this time.   5.  Seizure precautions 6.  Patient to continue follow up with neurology on an outpatient basis.  Patient unable to drive, operate heavy machinery, perform activities at heights and participate in water activities until release by outpatient physician.  Thana Farr,  MD Neurology [phone] 08/13/2017, 1:47 PM

## 2017-08-13 NOTE — Progress Notes (Signed)
  Discussed with neurology. 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 --Patient had trouble with increasing doses of Keppra in the past. We'll leave the keppra at 750 twice a day but  increase the Lamictal to 75 mg twice a day -Her last brain imaging studies were in 2016. We will get an MRI of the brain today -Physical therapy consult is still pending.

## 2017-08-13 NOTE — Progress Notes (Signed)
Inpatient Diabetes Program Recommendations  AACE/ADA: New Consensus Statement on Inpatient Glycemic Control (2015)  Target Ranges:  Prepandial:   less than 140 mg/dL      Peak postprandial:   less than 180 mg/dL (1-2 hours)      Critically ill patients:  140 - 180 mg/dL   Lab Results  Component Value Date   GLUCAP 150 (H) 08/13/2017   HGBA1C 6.7 (H) 06/27/2017    Review of Glycemic Control   Results for Leah FishmanWRIGHT, Kadedra R (MRN [id number]) as of 08/13/2017 09:50  Ref. Range 08/12/2017 11:57 08/12/2017 15:47 08/12/2017 17:08 08/12/2017 21:54 08/13/2017 07:32  Glucose-Capillary Latest Ref Range: 65 - 99 mg/dL 409217 (H) 811242 (H) 914223 (H) 184 (H) 150 (H)   Diabetes history: DM 2 Outpatient Diabetes medications: Glipizide 10 mg Daily, Lantus 24 units QHS, Humalog 6-8 units tid with meals, Metformin 1000 mg BID   Current orders for Inpatient glycemic control: Lantus 12 units, Novolog Sensitive Correction 0-9 units tid + Novolog HS scale 0-5 units  Inpatient Diabetes Program Recommendations:    Consider discharging patient on Lantus 12 units qhs, Humalog 6 units tid with meals (if CBG > 100mg /gl) plus Humalog sliding scale as ordered from Dr. Tedd SiasSolum, Metformin 1000mg  bid   Patient reports having about low blood sugars 3 times per week, at 12:30am or 2:30am- I recommend d/c Glipizide to eliminated hypoglycemia.  Susette RacerJulie Jasmain Ahlberg, RN, BA, MHA, CDE Diabetes Coordinator Inpatient Diabetes Program  [phone] (Team Pager) [phone] [facility] Office) 08/13/2017 10:09 AM

## 2017-08-13 NOTE — Evaluation (Addendum)
Clinical/Bedside Swallow Evaluation Patient Details  Name: Leah Small MRN: [id number] Date of Birth: 02/26/1941  Today's Date: 08/13/2017 Time: SLP Start Time (ACUTE ONLY): 0930 SLP Stop Time (ACUTE ONLY): 1030 SLP Time Calculation (min) (ACUTE ONLY): 60 min  Past Medical History:  Past Medical History:  Diagnosis Date  . Acid reflux   . Arthritis    "all over"  . Chronic stable angina (HCC)   . Coronary artery disease   . Hypercholesterolemia   . Hypertension   . Myocardial infarction (HCC) 1991; 07/06/2015  . NSTEMI (non-ST elevated myocardial infarction) (HCC) 10/22/2015  . Psoriatic arthritis (HCC)   . 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 TIA (transient ischemic attack)    "several over the last 20 years" (10/22/2015)  . Type II diabetes mellitus (HCC)    Past Surgical History:  Past Surgical History:  Procedure Laterality Date  . ABDOMINAL HYSTERECTOMY  1973  . APPENDECTOMY  1950s   elementary school  . CARDIAC CATHETERIZATION  X 2-3  . CARDIAC CATHETERIZATION N/A 11/21/2015   Procedure: Left Heart Cath and Coronary Angiography;  Surgeon: Corky CraftsJayadeep S Varanasi, MD;  [facility];  Service: Cardiovascular;  Laterality: N/A;  . CARDIAC CATHETERIZATION  11/21/2015   Procedure: Coronary Stent Intervention;  Surgeon: Corky CraftsJayadeep S Varanasi, MD;  [facility];  Service: Cardiovascular;;  . CARDIAC CATHETERIZATION N/A 01/29/2016   Procedure: Left Heart Cath and Cors/Grafts Angiography;  Surgeon: Tonny BollmanMichael Cooper, MD;  [facility];  Service: Cardiovascular;  Laterality: N/A;  . CARDIAC CATHETERIZATION N/A 07/22/2016   Procedure: Left Heart Cath and Coronary Angiography;  Surgeon: Iran OuchMuhammad A Arida, MD;  Location: [facility] INVASIVE CV LAB;  Service: Cardiovascular;  Laterality: N/A;  . CAROTID STENT INSERTION Bilateral 2011-2012   right-left  . CATARACT EXTRACTION W/ INTRAOCULAR  LENS  IMPLANT, BILATERAL Bilateral 2009-2010  . CHOLECYSTECTOMY OPEN  ~ 2014  . CORONARY ANGIOPLASTY    . CORONARY ANGIOPLASTY WITH STENT PLACEMENT  11/21/2015  . CORONARY ARTERY BYPASS GRAFT  08/1995  . DILATION AND CURETTAGE OF UTERUS  X 1  . 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 TONSILLECTOMY  ~ 1947   2nd grade   HPI:   Pt is a 76 y.o. female with a known history of CAD status post CABG several years ago, cardiac stent placement in March 2017, few admissions for unstable angina after that requiring medical management, acid reflux, hypertension, psoriatic arthritis, diabetes mellitus, complex partial seizures presents from home secondary to sudden onset of chest pain. Patient was here last month for chest pain and was ruled out, she was discharged. Her last cardiac catheterization was in November 2017 that showed significant three-vessel disease but medical management was recommended at that time. Patient takes Imdur, metoprolol at home. She took her medications this morning. She was doing fine up until this afternoon around 3:00 when she started feeling weakness, and started having pain in her left shoulder radiating to her jaw. She took 1 nitroglycerin that relieved her pain. She had couple of belching episodes with relief. One hour later pain started back again and she took another nitroglycerin without significant relief. She called her son who works as an Museum/gallery exhibitions officerMT, who brought her to the emergency room. Her blood pressure initially was high with systolic 200s, she had never had hypotension issues. In the ED blood pressure  systolic is in the 90s in spite of 2 L of IV fluids. EKG shows significant ST depressions in the lateral leads V4 to V6 and also inferior leads 2 and 3. Which are new compared to her old EKGs    Assessment / Plan / Recommendation Clinical Impression  Pt appeared to present w/ adequate oropharyngeal phase swallow function w/  adequate bolus management for transfer/clearing, then swallowing. No overt s/s of aspiration were noted w/ any bolus trial given - pt consumed ~4 ozs of thin liquids via cup(no ice), purees, softened solids(for easier mastication). Pt assisted in feeding self but needed min setup assistance d/t overall weakness. No oral phase deficits noted w/ all trials. Pt fed self. Recommend a regular diet w/ thin liquids(cut meats for easier mastication if needed) w/ general aspiration precautions; Pills in puree if easier for swallowing. Tray setup at meals. No further skilled ST Services indicated at this time. NSG updated.  SLP Visit Diagnosis: Dysphagia, unspecified (R13.10)    Aspiration Risk  (reduced following precautions)    Diet Recommendation  Mech Soft/Regular diet(meats cut well, moistened) w/ Thin liquids; general aspiration precautions; general Reflux precautions  Medication Administration: Whole meds with puree(for easier swallowing - pt c/o difficulty w/ pills)    Other  Recommendations Recommended Consults: (Dietician as needed) Oral Care Recommendations: Oral care BID;Patient independent with oral care Other Recommendations: (n/a)   Follow up Recommendations None      Frequency and Duration (n/a)  (n/a)       Prognosis Prognosis for Safe Diet Advancement: Good Barriers to Reach Goals: (none)      Swallow Study   General Date of Onset: 08/10/17 Type of Study: Bedside Swallow Evaluation Previous Swallow Assessment: none reported Diet Prior to this Study: Regular;Thin liquids(at home; NPO while intubated) Temperature Spikes Noted: No(wbc 5.2) Respiratory Status: Nasal cannula(2 liters) History of Recent Intubation: No Behavior/Cognition: Alert;Cooperative;Pleasant mood;Distractible(min) Oral Cavity Assessment: Within Functional Limits Oral Care Completed by SLP: Recent completion by staff Oral Cavity - Dentition: Adequate natural dentition Vision: Functional for  self-feeding Self-Feeding Abilities: Able to feed self;Needs set up Patient Positioning: Upright in bed Baseline Vocal Quality: Normal Volitional Cough: Strong Volitional Swallow: Able to elicit    Oral/Motor/Sensory Function Overall Oral Motor/Sensory Function: Within functional limits   Ice Chips Ice chips: Within functional limits Presentation: Spoon(fed; 1 trial)   Thin Liquid Thin Liquid: Within functional limits Presentation: Cup;Self Fed(~4 ozs)    Nectar Thick Nectar Thick Liquid: Not tested   Honey Thick Honey Thick Liquid: Not tested   Puree Puree: Within functional limits Presentation: Self Fed;Spoon(6+ trials)   Solid   GO   Solid: Within functional limits(mech soft foods; moistened) Presentation: Self Fed;Spoon(5 trials)    Functional Assessment Tool Used: clinical judgement Functional Limitations: Swallowing Swallow Current Status ([id number]): At least 1 percent but less than 20 percent impaired, limited or restricted Swallow Goal Status [phone]([id number]): At least 1 percent but less than 20 percent impaired, limited or restricted Swallow Discharge Status [phone]([id number]): At least 1 percent but less than 20 percent impaired, limited or restricted    Jerilynn SomKatherine Hagop Mccollam, MS, CCC-SLP [place] 08/13/2017,12:56 PM

## 2017-08-13 NOTE — Progress Notes (Signed)
Name: Leah Small MRN: [id number] DOB: Mar 12, 1941     CONSULTATION DATE: 08/10/2017  Subjective: No major issues last night  Objective: She is awake, no distress and no motor deficits  PAST MEDICAL HISTORY :   has a past medical history of Acid reflux, Arthritis, Chronic stable angina (HCC), Coronary artery disease, Hypercholesterolemia, Hypertension, Myocardial infarction (HCC) (1991; 07/06/2015), NSTEMI (non-ST elevated myocardial infarction) (HCC) (10/22/2015), Psoriatic arthritis (HCC), Seizures (HCC), TIA (transient ischemic attack), and Type II diabetes mellitus (HCC).  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 Prior to Admission medications   Medication Sig Start Date End Date Taking? Authorizing Provider  Adalimumab (HUMIRA PEN [place]) Inject 1 application into the skin See admin instructions. Patient takes it twice monthly.The next one is due June 26th   Yes [provider]  aspirin EC 81 MG tablet Take 81 mg by mouth at bedtime.   Yes [provider]  atorvastatin (LIPITOR) 80 MG tablet Take 1 tablet (80 mg total) by mouth at bedtime. Patient taking differently: Take 40 mg by mouth at bedtime.  11/22/15  Yes Rai, Ripudeep K, MD  bimatoprost (LUMIGAN) 0.01 % SOLN Place 1 drop into both eyes at bedtime.   Yes [provider]  gabapentin  (NEURONTIN) 100 MG capsule Take 100-200 mg by mouth daily as needed.   Yes [provider]  glipiZIDE (GLUCOTROL XL) 10 MG 24 hr tablet Take 10 mg by mouth every morning. 04/26/15  Yes [provider]  Insulin Glargine (LANTUS SOLOSTAR) 100 UNIT/ML Solostar Pen Inject 20 Units into the skin at bedtime.    Yes [provider]  insulin lispro (HUMALOG) 100 UNIT/ML injection Inject 0-6 Units into the skin 3 (three) times daily before meals.    Yes [provider]  isosorbide mononitrate (IMDUR) 60 MG 24 hr tablet Take 1 tablet (60 mg total) daily by mouth. 06/29/17  Yes Enedina Finner, MD  lamoTRIgine (LAMICTAL) 25 MG tablet Take 50 mg by mouth 2 (two) times daily. 06/14/17  Yes [provider]  levETIRAcetam (KEPPRA) 750 MG tablet Take 750 mg by mouth 2 (two) times daily.   Yes [provider]  metFORMIN (GLUCOPHAGE) 1000 MG tablet Take 1 tablet (1,000 mg total) by mouth 2 (two) times daily. 01/30/16  Yes Creig Hines, NP  metoprolol tartrate (LOPRESSOR) 25 MG tablet Take 1 tablet (25 mg total) by mouth at bedtime. 07/23/16  Yes Little Ishikawa, NP  ranitidine (ZANTAC) 150 MG tablet Take 150 mg by mouth every morning.    Yes [provider]  ranolazine (RANEXA) 500 MG 12 hr tablet Take 1 tablet by mouth 2 (two) times daily. 03/26/14 07/05/18 Yes [provider]  timolol (TIMOPTIC) 0.5 % ophthalmic solution Place 1 drop into both eyes 2 (two) times daily. 06/14/17  Yes [provider]  vitamin B-12 (CYANOCOBALAMIN) 1000 MCG tablet Take 500 mcg by mouth every morning.    Yes [provider]  vitamin E 400 UNIT capsule Take 400 Units by mouth every morning.    Yes [provider]  clopidogrel (PLAVIX) 75 MG tablet Take 1 tablet (75 mg total) by mouth every morning. 08/13/17   Enid BaasKalisetti, Radhika, MD  guaiFENesin-dextromethorphan (ROBITUSSIN DM) 100-10 MG/5ML syrup Take 5 mLs every 4 (four) hours as needed by  mouth for cough. 06/29/17   Enedina FinnerPatel, Sona, MD  isosorbide mononitrate (IMDUR) 30 MG 24 hr tablet Take 1 tablet (30 mg total) by mouth daily. 08/13/17   Enid BaasKalisetti, Radhika, MD  nitroGLYCERIN (NITRODUR - DOSED IN MG/24 HR) 0.4 mg/hr patch Place 0.4 mg onto the skin daily as needed (for chest pain).  02/19/16   [provider]  nitroGLYCERIN (NITROSTAT) 0.4 MG SL tablet Place 0.4 mg under the tongue every 5 (five) minutes as needed for chest pain.    [provider]  nystatin cream (MYCOSTATIN) Apply 1 application topically 2 (two) times daily as needed (yeast infection).  04/25/15   [provider]  senna-docusate (SENOKOT-S) 8.6-50 MG tablet Take 1 tablet 2 (two) times daily by mouth. Patient not taking: Reported on 08/11/2017 06/29/17   Lewie Loronukov, Magadalene S, NP  triamcinolone ointment (KENALOG) 0.1 % Apply 1 application topically 3 (three) times daily as needed (itching from psoriasis).    [provider]   Allergies  Allergen Reactions  . Talwin [Pentazocine] Anaphylaxis and Swelling    Throat swelling  . Valium [Diazepam] Other (See Comments)    Makes her feel like she's flying  . Vicodin [Hydrocodone-Acetaminophen] Nausea And Vomiting  . Adhesive [Tape] Other (See Comments)    Bruising and TEARING!!!  Please use "paper" tape  . Beta Adrenergic Blockers Other (See Comments)    Pt states she not allergic  . Levofloxacin Swelling and Other (See Comments)    Tongue swells   . Simvastatin Swelling and Rash    Mouth swelling       VITAL SIGNS: Temp:  [97.5 F (36.4 C)-97.9 F (36.6 C)] 97.9 F (36.6 C) (12/21 0500) Pulse Rate:  [58-97] 58 (12/21 0700) Resp:  [13-26] 13 (12/21 0700) BP: (91-209)/(48-111) 92/50 (12/21 0700) SpO2:  [94 %-100 %] 99 % (12/21 0700) Weight:  [60.1 kg (132 lb 7.9 oz)] 60.1 kg (132 lb 7.9 oz) (12/21 0600)  Physical Examination:  -Awake and oriented in no acute neurological deficits -Tolerating nasal cannula, no distress,  able to talk in full sentences.  BEAE and no adventitious sounds -S1 and S to order audible and no murmur -Benign abdomen and normal peristalsis -Wasted extremities and no edema   Recent Labs  Lab 08/10/17 1829 08/11/17 0522 08/12/17 1620  NA 136 139 134*  K 4.5 4.6 4.1  CL 104 109 103  CO2 25 24 23   BUN 17 12 14   CREATININE 0.57 0.56 0.68  GLUCOSE 154* 211* 211*   Recent Labs  Lab 08/10/17 1829 08/11/17 0522 08/12/17 1620  HGB 10.2* 9.9* 10.9*  HCT 31.4* 30.8* 33.4*  WBC 4.4 4.3 5.2  PLT 180 159 165   Ct Angio Head W Or Wo Contrast  Result Date: 08/12/2017 CLINICAL DATA:  Left-sided weakness.  Aphasia. EXAM: CT ANGIOGRAPHY HEAD AND NECK CT PERFUSION BRAIN TECHNIQUE: Multidetector CT imaging of the head and neck was performed using the standard protocol during bolus administration of intravenous contrast. Multiplanar CT image reconstructions and MIPs were obtained to evaluate the vascular anatomy. Carotid stenosis measurements (when applicable) are obtained utilizing NASCET criteria, using the distal internal carotid diameter as the denominator. Multiphase CT imaging of the brain was performed following IV bolus contrast injection. Subsequent parametric perfusion maps were  calculated using RAPID software. CONTRAST:  ISOVUE-370 IOPAMIDOL (ISOVUE-370) INJECTION 76% COMPARISON:  Head MRI/MRA 07/06/2015.  Neck CTA 08/10/2010. FINDINGS: CTA NECK FINDINGS Aortic arch: Standard 3 vessel aortic arch with mild atherosclerotic plaque. Widely patent arch vessel origins. Calcified plaque at the subclavian artery origins has mildly increased from 2011 but does not result in significant stenosis. Right carotid system: Patent with evidence of prior endarterectomy. Increased intimal thickening and new calcified plaque in the distal common carotid artery result in less than 50% stenosis. There is new mild calcified plaque in the proximal ICA without stenosis. Progressive calcified plaque in the  distal cervical ICA does not result in significant stenosis. Left carotid system: Patent with evidence of interval endarterectomy since the 2011 CTA. Intimal thickening and noncalcified plaque in the proximal ICA with mild luminal irregularity and possible tiny foci of plaque ulceration. No stenosis. Vertebral arteries: Patent and codominant with mild scattered atherosclerosis but no significant stenosis. Skeleton: Cervical spondylosis.  No suspicious osseous lesion. Other neck: No mass or lymph node enlargement. Upper chest: Clear lung apices. Review of the MIP images confirms the above findings CTA HEAD FINDINGS Anterior circulation: The internal carotid arteries are patent from skullbase to carotid termini. Siphon atherosclerosis bilaterally results in at most mild left cavernous and bilateral supraclinoid stenosis. ACAs and MCAs are patent without evidence of proximal branch occlusion or significant proximal stenosis. No aneurysm. Posterior circulation: The intracranial vertebral arteries are widely patent to the basilar. Patent bilateral PICA, right AICA, and bilateral SCA origins are identified. The basilar artery is patent with punctate focal calcified plaque at the vertebrobasilar junction which does not result in significant stenosis. Posterior communicating arteries are not identified. The PCAs are patent with a severe focal proximal P1 stenosis on the left which has increased from the 2016 MRA. The left PCA is patent distal to this. No significant proximal right PCA stenosis is seen. No aneurysm. Venous sinuses: Patent. Anatomic variants: None. Delayed phase: Not performed. Review of the MIP images confirms the above findings CT Brain Perfusion Findings: CBF (<30%) Volume:  0 mL Perfusion (Tmax>6.0s) volume:  0 mL Mismatch Volume: n/a Infarction Location: n/a IMPRESSION: 1. No emergent large vessel occlusion. 2. Severe proximal left PCA stenosis, progressed from 2016. 3. No flow limiting proximal anterior  circulation stenosis. 4. Prior bilateral carotid endarterectomy. Right greater than left cervical carotid artery atherosclerosis without significant stenosis. 5.  Aortic Atherosclerosis (ICD10-I70.0). 6. No evidence of acute infarct or significant oligemia on CT perfusion imaging. These results were called by telephone at the time of interpretation on 08/12/2017 at 4:39 pm to Dr. Shaune Pollack , who verbally acknowledged these results. Electronically Signed   By: Sebastian Ache M.D.   On: 08/12/2017 16:55   Ct Angio Neck W Or Wo Contrast  Result Date: 08/12/2017 CLINICAL DATA:  Left-sided weakness.  Aphasia. EXAM: CT ANGIOGRAPHY HEAD AND NECK CT PERFUSION BRAIN TECHNIQUE: Multidetector CT imaging of the head and neck was performed using the standard protocol during bolus administration of intravenous contrast. Multiplanar CT image reconstructions and MIPs were obtained to evaluate the vascular anatomy. Carotid stenosis measurements (when applicable) are obtained utilizing NASCET criteria, using the distal internal carotid diameter as the denominator. Multiphase CT imaging of the brain was performed following IV bolus contrast injection. Subsequent parametric perfusion maps were calculated using RAPID software. CONTRAST:  ISOVUE-370 IOPAMIDOL (ISOVUE-370) INJECTION 76% COMPARISON:  Head MRI/MRA 07/06/2015.  Neck CTA 08/10/2010. FINDINGS: CTA NECK FINDINGS Aortic arch: Standard 3 vessel aortic  arch with mild atherosclerotic plaque. Widely patent arch vessel origins. Calcified plaque at the subclavian artery origins has mildly increased from 2011 but does not result in significant stenosis. Right carotid system: Patent with evidence of prior endarterectomy. Increased intimal thickening and new calcified plaque in the distal common carotid artery result in less than 50% stenosis. There is new mild calcified plaque in the proximal ICA without stenosis. Progressive calcified plaque in the distal cervical ICA does not  result in significant stenosis. Left carotid system: Patent with evidence of interval endarterectomy since the 2011 CTA. Intimal thickening and noncalcified plaque in the proximal ICA with mild luminal irregularity and possible tiny foci of plaque ulceration. No stenosis. Vertebral arteries: Patent and codominant with mild scattered atherosclerosis but no significant stenosis. Skeleton: Cervical spondylosis.  No suspicious osseous lesion. Other neck: No mass or lymph node enlargement. Upper chest: Clear lung apices. Review of the MIP images confirms the above findings CTA HEAD FINDINGS Anterior circulation: The internal carotid arteries are patent from skullbase to carotid termini. Siphon atherosclerosis bilaterally results in at most mild left cavernous and bilateral supraclinoid stenosis. ACAs and MCAs are patent without evidence of proximal branch occlusion or significant proximal stenosis. No aneurysm. Posterior circulation: The intracranial vertebral arteries are widely patent to the basilar. Patent bilateral PICA, right AICA, and bilateral SCA origins are identified. The basilar artery is patent with punctate focal calcified plaque at the vertebrobasilar junction which does not result in significant stenosis. Posterior communicating arteries are not identified. The PCAs are patent with a severe focal proximal P1 stenosis on the left which has increased from the 2016 MRA. The left PCA is patent distal to this. No significant proximal right PCA stenosis is seen. No aneurysm. Venous sinuses: Patent. Anatomic variants: None. Delayed phase: Not performed. Review of the MIP images confirms the above findings CT Brain Perfusion Findings: CBF (<30%) Volume:  0 mL Perfusion (Tmax>6.0s) volume:  0 mL Mismatch Volume: n/a Infarction Location: n/a IMPRESSION: 1. No emergent large vessel occlusion. 2. Severe proximal left PCA stenosis, progressed from 2016. 3. No flow limiting proximal anterior circulation stenosis. 4.  Prior bilateral carotid endarterectomy. Right greater than left cervical carotid artery atherosclerosis without significant stenosis. 5.  Aortic Atherosclerosis (ICD10-I70.0). 6. No evidence of acute infarct or significant oligemia on CT perfusion imaging. These results were called by telephone at the time of interpretation on 08/12/2017 at 4:39 pm to Dr. Shaune Pollack , who verbally acknowledged these results. Electronically Signed   By: Sebastian Ache M.D.   On: 08/12/2017 16:55   Ct Cerebral Perfusion W Contrast  Result Date: 08/12/2017 CLINICAL DATA:  Left-sided weakness.  Aphasia. EXAM: CT ANGIOGRAPHY HEAD AND NECK CT PERFUSION BRAIN TECHNIQUE: Multidetector CT imaging of the head and neck was performed using the standard protocol during bolus administration of intravenous contrast. Multiplanar CT image reconstructions and MIPs were obtained to evaluate the vascular anatomy. Carotid stenosis measurements (when applicable) are obtained utilizing NASCET criteria, using the distal internal carotid diameter as the denominator. Multiphase CT imaging of the brain was performed following IV bolus contrast injection. Subsequent parametric perfusion maps were calculated using RAPID software. CONTRAST:  ISOVUE-370 IOPAMIDOL (ISOVUE-370) INJECTION 76% COMPARISON:  Head MRI/MRA 07/06/2015.  Neck CTA 08/10/2010. FINDINGS: CTA NECK FINDINGS Aortic arch: Standard 3 vessel aortic arch with mild atherosclerotic plaque. Widely patent arch vessel origins. Calcified plaque at the subclavian artery origins has mildly increased from 2011 but does not result in significant stenosis. Right carotid system: Patent  with evidence of prior endarterectomy. Increased intimal thickening and new calcified plaque in the distal common carotid artery result in less than 50% stenosis. There is new mild calcified plaque in the proximal ICA without stenosis. Progressive calcified plaque in the distal cervical ICA does not result in significant  stenosis. Left carotid system: Patent with evidence of interval endarterectomy since the 2011 CTA. Intimal thickening and noncalcified plaque in the proximal ICA with mild luminal irregularity and possible tiny foci of plaque ulceration. No stenosis. Vertebral arteries: Patent and codominant with mild scattered atherosclerosis but no significant stenosis. Skeleton: Cervical spondylosis.  No suspicious osseous lesion. Other neck: No mass or lymph node enlargement. Upper chest: Clear lung apices. Review of the MIP images confirms the above findings CTA HEAD FINDINGS Anterior circulation: The internal carotid arteries are patent from skullbase to carotid termini. Siphon atherosclerosis bilaterally results in at most mild left cavernous and bilateral supraclinoid stenosis. ACAs and MCAs are patent without evidence of proximal branch occlusion or significant proximal stenosis. No aneurysm. Posterior circulation: The intracranial vertebral arteries are widely patent to the basilar. Patent bilateral PICA, right AICA, and bilateral SCA origins are identified. The basilar artery is patent with punctate focal calcified plaque at the vertebrobasilar junction which does not result in significant stenosis. Posterior communicating arteries are not identified. The PCAs are patent with a severe focal proximal P1 stenosis on the left which has increased from the 2016 MRA. The left PCA is patent distal to this. No significant proximal right PCA stenosis is seen. No aneurysm. Venous sinuses: Patent. Anatomic variants: None. Delayed phase: Not performed. Review of the MIP images confirms the above findings CT Brain Perfusion Findings: CBF (<30%) Volume:  0 mL Perfusion (Tmax>6.0s) volume:  0 mL Mismatch Volume: n/a Infarction Location: n/a IMPRESSION: 1. No emergent large vessel occlusion. 2. Severe proximal left PCA stenosis, progressed from 2016. 3. No flow limiting proximal anterior circulation stenosis. 4. Prior bilateral carotid  endarterectomy. Right greater than left cervical carotid artery atherosclerosis without significant stenosis. 5.  Aortic Atherosclerosis (ICD10-I70.0). 6. No evidence of acute infarct or significant oligemia on CT perfusion imaging. These results were called by telephone at the time of interpretation on 08/12/2017 at 4:39 pm to Dr. Shaune PollackQING CHEN , who verbally acknowledged these results. Electronically Signed   By: Sebastian AcheAllen  Grady M.D.   On: 08/12/2017 16:55   Ct Head Code Stroke Wo Contrast`  Result Date: 08/12/2017 CLINICAL DATA:  Code stroke.  Left-sided weakness.  Aphasia. EXAM: CT HEAD WITHOUT CONTRAST TECHNIQUE: Contiguous axial images were obtained from the base of the skull through the vertex without intravenous contrast. COMPARISON:  03/29/2016 FINDINGS: Brain: There is no evidence of acute infarct, intracranial hemorrhage, mass, midline shift, or extra-axial fluid collection. There is mild cerebral atrophy. Periventricular white matter hypodensities are nonspecific but compatible with mild chronic small vessel ischemic disease. Vascular: Calcified atherosclerosis at the skullbase. No hyperdense vessel. Skull: No fracture or focal osseous lesion. Sinuses/Orbits: Mild right and moderate left ethmoid air cell mucosal thickening. Clear mastoid air cells. Bilateral cataract extraction. Other: None. ASPECTS [facility]([place] Stroke Program Early CT Score) - Ganglionic level infarction (caudate, lentiform nuclei, internal capsule, insula, M1-M3 cortex): 7 - Supraganglionic infarction (M4-M6 cortex): 3 Total score (0-10 with 10 being normal): 10 IMPRESSION: 1. No evidence of acute intracranial abnormality. 2. ASPECTS is 10. Electronically Signed   By: Sebastian AcheAllen  Grady M.D.   On: 08/12/2017 16:21    ASSESSMENT / PLAN: -TIA vs Todd's paralysis with partial complex  seizure (resolved).  Keppra and Lamictal,  and management as per neurology. CT brain perfusion: Severe proximal left PCA stenosis, status post a bilateral carotid  endarterectomy.  No evidence of acute infarct.  -Unstable angina and the cardiogenic shock (improved) with history of coronary artery disease status post PCI.  Double antiplatelet with aspirin Plavix, Ranexa, statin no invasive ischemic workup was advisedB and management as per cardiology.  -Diabetes mellitus.  Glycemic control.  -Anemia.  Hemoglobin more than 7 g/dL.  -DNR  -DVT and GI prophylaxis.  Continue with supportive care.  Critical care time 35 minutes   -

## 2017-08-13 NOTE — Progress Notes (Signed)
OT Cancellation Note  Patient Details Name: Cedric FishmanDonnie R Newton MRN: [id number] DOB: 26-Sep-1940   Cancelled Treatment:    Reason Eval/Treat Not Completed: Other (comment). Order received, chart reviewed. Spoke with ICU Licensed conveyancerunit secretary. RN on the phone calling report for pt to transfer to telemetry momentarily. Will re-attempt OT evaluation next date once pt is transferred to new room.  Richrd PrimeJamie Stiller, MPH, MS, OTR/L ascom [phone] 08/13/17, 4:39 PM

## 2017-08-14 LAB — GLUCOSE, CAPILLARY
GLUCOSE-CAPILLARY: 335 mg/dL — AB (ref 65–99)
Glucose-Capillary: 175 mg/dL — ABNORMAL HIGH (ref 65–99)

## 2017-08-14 MED ORDER — RANOLAZINE ER 500 MG PO TB12: 500.0000 mg | ORAL_TABLET | Freq: Two times a day (BID) | ORAL | 1 refills | Status: AC

## 2017-08-14 MED ORDER — ISOSORBIDE MONONITRATE ER 30 MG PO TB24: 30.0000 mg | ORAL_TABLET | Freq: Every day | ORAL | 2 refills | Status: DC

## 2017-08-14 MED ORDER — LAMOTRIGINE 25 MG PO TABS: 75.0000 mg | ORAL_TABLET | Freq: Two times a day (BID) | ORAL | 2 refills | Status: DC

## 2017-08-14 MED ORDER — INSULIN GLARGINE 100 UNIT/ML SOLOSTAR PEN: 18.0000 [IU] | PEN_INJECTOR | Freq: Every day | SUBCUTANEOUS | 11 refills | Status: DC

## 2017-08-14 MED ORDER — CLOPIDOGREL BISULFATE 75 MG PO TABS: 75.0000 mg | ORAL_TABLET | ORAL | 2 refills | Status: AC

## 2017-08-14 NOTE — Care Management Note (Signed)
Case Management Note  Patient Details  Name: Leah Small MRN: [id number] Date of Birth: Sep 16, 1940  Subjective/Objective:   [facility]yl [facility] for HH=PT, OT, RN.                  Action/Plan:   Expected Discharge Date:  08/14/17               Expected Discharge Plan:  Home w Home Health Services  In-House Referral:     Discharge planning Services  CM Consult  Post Acute Care Choice:  NA Choice offered to:  NA  DME Arranged:    DME Agency:     HH Arranged:  RN, OT HH [facility]  Status of Service:  Completed, signed off  If discussed at Long Length of Stay Meetings, dates discussed:    Additional Comments:  Leah Small A, RN 08/14/2017, 10:34 AM

## 2017-08-14 NOTE — Discharge Summary (Signed)
[facility] [place] at [facility]   PATIENT NAME: Leah FrederickDonnie Small    MR#:  [id number]  DATE OF BIRTH:  [birth year]  DATE OF ADMISSION:  08/10/2017   ADMITTING PHYSICIAN: Enid Baasadhika Kataryna Mcquilkin, MD  DATE OF DISCHARGE: 08/14/2017  PRIMARY CARE PHYSICIAN: Marguarite ArbourSparks, Jeffrey D, MD   ADMISSION DIAGNOSIS:   Hypotension, unspecified hypotension type [I95.9] Chest pain, unspecified type [R07.9]  DISCHARGE DIAGNOSIS:   Active Problems:   Unstable angina (HCC)   Malnutrition of moderate degree   SECONDARY DIAGNOSIS:   Past Medical History:  Diagnosis Date  . Acid reflux   . Arthritis    "all over"  . Chronic stable angina (HCC)   . Coronary artery disease   . Hypercholesterolemia   . Hypertension   . Myocardial infarction (HCC) 1991; 07/06/2015  . NSTEMI (non-ST elevated myocardial infarction) (HCC) 10/22/2015  . Psoriatic arthritis (HCC)   . 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 TIA (transient ischemic attack)    "several over the last 20 years" (10/22/2015)  . Type II diabetes mellitus [facility](HCC)     HOSPITAL COURSE:   DonnieWrightis a76 y.o.femalewith a known history of CAD status post CABG several years ago, cardiac stent placement in March 2017, few admissions for unstable angina after that requiring medical management, hypertension, psoriatic arthritis, diabetes mellitus, complex partial seizures presents from home secondary to sudden onset of chest pain.  1. Unstable angina-was significantly hypotensive with chest pain on admission. Admitted to ICU with ST depressions.. -Significant known 3 vessel disease. Last cardiac catheterization was in November 2017. Last stent was in March 2017. -Appreciate cardiology consult. Patient has ruled out for MI. -Blood pressure is improved. on low-dose Imdur , metoprolol and Ranexa at this time. -Symptoms have improved significantly. No further diagnostic  cardiac testing to be performed. -Echocardiogram stable at baseline without any new wall motion changes.. -Continue aspirin and Plavix.  - Patient has occasional chest pain episodes from her stable angina. -Follow-up with cardiology after discharge  2. Left-sided weakness and aphasia- had a transient episode after ambulation initially. It has completely resolved. -MRI showing tiny lacunar infarct in the right motor strip area. Already on aspirin, Plavix and statin. 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 -Lamictal dose has been increased. Outpatient follow-up with neurology recommended. -  physical therapy and occupational therapy. Maintain her blood pressure without any drops - CT angio of head and neck done as well with chronic atherosclerosis  3. Hypotension-could be cardiogenic shock. Improved now. -On low-dose Imdur re-started . Also continue low dose Toprol at bedtime at discharge. -on Ranexa.  -Echo with normal ejection fraction.  3. Diabetes mellitus-on Lantus and metformin at home. Glipizide has been discontinued due to low normal sugars in the hospital.  4. Complex partial seizures- Discussed with neurology. As it presented like Todd's paralysis after her complex partial seizure. Plan is to increase her anti epileptic medication --Patient had trouble with increasing doses of Keppra in the past. We'll leave the keppra at 750 twice a day but  increase the Lamictal to 75 mg twice a day -Her last brain imaging studies were in 2016.  -MRI this admission shows a septal acute lacunar infarct in the superior right motor strip. Patient already on aspirin, Plavix was added. -Likely secondary to hypotension and she presented. -Recovered completely at this time. Continue aspirin, Plavix and statin. -Physical  therapy recommended home health. -Updated both son and patient of this new finding right to discharge. Outpatient follow-up  with neurology recommended    Encourage ambulation. Anticipate discharge today    DISCHARGE CONDITIONS:   Guarded  CONSULTS OBTAINED:   Treatment Team:  Marcina Millard, MD Merwyn Katos, MD Thana Farr, MD  DRUG ALLERGIES:   Allergies  Allergen Reactions  . Talwin [Pentazocine] Anaphylaxis and Swelling    Throat swelling  . Valium [Diazepam] Other (See Comments)    Makes her feel like she's flying  . Vicodin [Hydrocodone-Acetaminophen] Nausea And Vomiting  . Adhesive [Tape] Other (See Comments)    Bruising and TEARING!!!  Please use "paper" tape  . Beta Adrenergic Blockers Other (See Comments)    Pt states she not allergic  . Levofloxacin Swelling and Other (See Comments)    Tongue swells   . Simvastatin Swelling and Rash    Mouth swelling   DISCHARGE MEDICATIONS:   Allergies as of 08/14/2017      Reactions   Talwin [pentazocine] Anaphylaxis, Swelling   Throat swelling   Valium [diazepam] Other (See Comments)   Makes her feel like she's flying   Vicodin [hydrocodone-acetaminophen] Nausea And Vomiting   Adhesive [tape] Other (See Comments)   Bruising and TEARING!!!  Please use "paper" tape   Beta Adrenergic Blockers Other (See Comments)   Pt states she not allergic   Levofloxacin Swelling, Other (See Comments)   Tongue swells   Simvastatin Swelling, Rash   Mouth swelling      Medication List    STOP taking these medications   glipiZIDE 10 MG 24 hr tablet Commonly known as:  GLUCOTROL XL     TAKE these medications   aspirin EC 81 MG tablet Take 81 mg by mouth at bedtime.   atorvastatin 80 MG tablet Commonly known as:  LIPITOR Take 1 tablet (80 mg total) by mouth at bedtime. What changed:  how much to take   bimatoprost 0.01 % Soln Commonly known as:  LUMIGAN Place 1 drop into both eyes at bedtime.   clopidogrel 75 MG tablet Commonly known as:  PLAVIX Take 1 tablet (75 mg total) by mouth every morning.   gabapentin 100 MG  capsule Commonly known as:  NEURONTIN Take 100-200 mg by mouth daily as needed.   guaiFENesin-dextromethorphan 100-10 MG/5ML syrup Commonly known as:  ROBITUSSIN DM Take 5 mLs every 4 (four) hours as needed by mouth for cough.   HUMIRA PEN [place] Inject 1 application into the skin See admin instructions. Patient takes it twice monthly.The next one is due June 26th   Insulin Glargine 100 UNIT/ML Solostar Pen Commonly known as:  LANTUS SOLOSTAR Inject 18 Units into the skin at bedtime. What changed:  how much to take   insulin lispro 100 UNIT/ML injection Commonly known as:  HUMALOG Inject 0-6 Units into the skin 3 (three) times daily before meals.   isosorbide mononitrate 30 MG 24 hr tablet Commonly known as:  IMDUR Take 1 tablet (30 mg total) by mouth daily. What changed:    medication strength  how much to take   lamoTRIgine 25 MG tablet Commonly known as:  LAMICTAL Take 3 tablets (75 mg total) by mouth 2 (two) times daily. What changed:  how much to take   levETIRAcetam 750 MG tablet Commonly known as:  KEPPRA Take 750 mg by mouth 2 (two) times daily.   metFORMIN 1000 MG tablet Commonly known as:  GLUCOPHAGE Take 1 tablet (1,000 mg  total) by mouth 2 (two) times daily.   metoprolol tartrate 25 MG tablet Commonly known as:  LOPRESSOR Take 1 tablet (25 mg total) by mouth at bedtime.   nitroGLYCERIN 0.4 MG SL tablet Commonly known as:  NITROSTAT Place 0.4 mg under the tongue every 5 (five) minutes as needed for chest pain. What changed:  Another medication with the same name was removed. Continue taking this medication, and follow the directions you see here.   nystatin cream Commonly known as:  MYCOSTATIN Apply 1 application topically 2 (two) times daily as needed (yeast infection).   ranitidine 150 MG tablet Commonly known as:  ZANTAC Take 150 mg by mouth every morning.   ranolazine 500 MG 12 hr tablet Commonly known as:  RANEXA Take 1 tablet (500 mg total) by  mouth 2 (two) times daily.   senna-docusate 8.6-50 MG tablet Commonly known as:  Senokot-S Take 1 tablet 2 (two) times daily by mouth.   timolol 0.5 % ophthalmic solution Commonly known as:  TIMOPTIC Place 1 drop into both eyes 2 (two) times daily.   triamcinolone ointment 0.1 % Commonly known as:  KENALOG Apply 1 application topically 3 (three) times daily as needed (itching from psoriasis).   vitamin B-12 1000 MCG tablet Commonly known as:  CYANOCOBALAMIN Take 500 mcg by mouth every morning.   vitamin E 400 UNIT capsule Take 400 Units by mouth every morning.        DISCHARGE INSTRUCTIONS:   1. PCP f/u in 1-2 weeks 2. Cardiology f/u in 2 weeks 3. Neurology f/u in 3 weeks  DIET:   Cardiac diet  ACTIVITY:   Activity as tolerated  OXYGEN:   Home Oxygen: No.  Oxygen Delivery: room air  DISCHARGE LOCATION:   home   If you experience worsening of your admission symptoms, develop shortness of breath, life threatening emergency, suicidal or homicidal thoughts you must seek medical attention immediately by calling 911 or calling your MD immediately  if symptoms less severe.  You Must read complete instructions/literature along with all the possible adverse reactions/side effects for all the Medicines you take and that have been prescribed to you. Take any new Medicines after you have completely understood and accpet all the possible adverse reactions/side effects.   Please note  You were cared for by a hospitalist during your hospital stay. If you have any questions about your discharge medications or the care you received while you were in the hospital after you are discharged, you can call the unit and asked to speak with the hospitalist on call if the hospitalist that took care of you is not available. Once you are discharged, your primary care physician will handle any further medical issues. Please note that NO REFILLS for any discharge medications will be  authorized once you are discharged, as it is imperative that you return to your primary care physician (or establish a relationship with a primary care physician if you do not have one) for your aftercare needs so that they can reassess your need for medications and monitor your lab values.    On the day of Discharge:  VITAL SIGNS:   Blood pressure (!) 160/71, pulse 73, temperature 97.6 F (36.4 C), temperature source Oral, resp. rate 18, height 5\' 8"  (1.727 m), weight 60.7 kg (133 lb 12.8 oz), SpO2 100 %.  PHYSICAL EXAMINATION:     GENERAL:  76 y.o.-year-old patient lying in the bed with no acute distress.  EYES: Pupils equal, round, reactive to light  and accommodation. No scleral icterus. Extraocular muscles intact.  HEENT: Head atraumatic, normocephalic. Oropharynx and nasopharynx clear.  NECK:  Supple, no jugular venous distention. No thyroid enlargement, no tenderness.  LUNGS: Normal breath sounds bilaterally, no wheezing, rales,rhonchi or crepitation. No use of accessory muscles of respiration.  CARDIOVASCULAR: S1, S2 normal. No  rubs, or gallops. 2/6 systolic murmur present ABDOMEN: Soft, nontender, nondistended. Bowel sounds present. No organomegaly or mass.  EXTREMITIES: No pedal edema, cyanosis, or clubbing.  NEUROLOGIC: Cranial nerves II through XII are intact. Muscle strength 5/5 in all extremities. Sensation intact. Gait not checked.  PSYCHIATRIC: The patient is alert and oriented x 3.  SKIN: No obvious rash, lesion, or ulcer.     DATA REVIEW:   CBC Recent Labs  Lab 08/12/17 1620  WBC 5.2  HGB 10.9*  HCT 33.4*  PLT 165    Chemistries  Recent Labs  Lab 08/12/17 1620  NA 134*  K 4.1  CL 103  CO2 23  GLUCOSE 211*  BUN 14  CREATININE 0.68  CALCIUM 8.6*  AST 28  ALT 17  ALKPHOS 54  BILITOT 0.5     Microbiology Results  Results for orders placed or performed during the hospital encounter of 08/10/17  MRSA PCR Screening     Status: None   Collection  Time: 08/10/17  9:43 PM  Result Value Ref Range Status   MRSA by PCR NEGATIVE NEGATIVE Final    Comment:        The GeneXpert MRSA Assay (FDA approved for NASAL specimens only), is one component of a comprehensive MRSA colonization surveillance program. It is not intended to diagnose MRSA infection nor to guide or monitor treatment for MRSA infections.     RADIOLOGY:  Mr Brain 47Wo Contrast  Result Date: 08/13/2017 CLINICAL DATA:  76 year old female with left side weakness and aphasia presenting yesterday. EXAM: MRI HEAD WITHOUT CONTRAST TECHNIQUE: Multiplanar, multiecho pulse sequences of the brain and surrounding structures were obtained without intravenous contrast. COMPARISON:  CTA head and neck and CT brain perfusion 08/12/2017. [facility] FINDINGS: Brain: Punctate, subtle focus of restricted diffusion at the superior right motor strip or subcortical white matter seen on both series 100, image 42 and series 101 image 15. There is punctate associated T2 and FLAIR hyperintensity with no mass effect or definite blood products. No other restricted diffusion or evidence of acute infarction elsewhere. No midline shift, mass effect, evidence of mass lesion, ventriculomegaly, extra-axial collection or acute intracranial hemorrhage. Cervicomedullary junction and pituitary are within normal limits. Stable to mildly decreased cerebral volume since the prior MRI. Outside of the acute findings stable gray and white matter signal since 2016. No cortical encephalomalacia or definite chronic cerebral blood products. Patchy nonspecific T2 hyperintensity in the bilateral globus pallidus is stable. Other deep gray matter nuclei remain within normal limits. Brainstem and cerebellum remain normal for age. Vascular: Major intracranial vascular flow voids are stable. Skull and upper cervical spine: Normal bone marrow signal. Negative for age visualized cervical spine.  Sinuses/Orbits: Stable postoperative changes to the orbits. Progressed paranasal sinus disease since 2016. Moderate bilateral ethmoid and right greater than left maxillary sinus mucosal thickening. Other: Mastoid air cells are stable and well pneumatized. Visible internal auditory structures appear normal. Scalp and face soft tissues appear negative. IMPRESSION: 1. Subtle acute lacunar type infarct at the superior right motor strip near left upper extremity motor representation. No associated hemorrhage or mass effect. 2. No other acute intracranial abnormality.  3. Moderate paranasal sinus inflammation. Electronically Signed   By: Odessa Fleming M.D.   On: 08/13/2017 15:04     Management plans discussed with the patient, family and they are in agreement.  CODE STATUS:     Code Status Orders  (From admission, onward)        Start     Ordered   08/10/17 2134  Do not attempt resuscitation (DNR)  Continuous    Question Answer Comment  In the event of cardiac or respiratory ARREST Do not call a "code blue"   In the event of cardiac or respiratory ARREST Do not perform Intubation, CPR, defibrillation or ACLS   In the event of cardiac or respiratory ARREST Use medication by any route, position, wound care, and other measures to relive pain and suffering. May use oxygen, suction and manual treatment of airway obstruction as needed for comfort.      08/10/17 2133    Code Status History    Date Active Date Inactive Code Status Order ID Comments User Context   06/26/2017 19:20 06/29/2017 15:49 DNR [id number]  Marguarite Arbour, MD Inpatient   07/21/2016 22:50 07/23/2016 16:05 DNR [id number]  Thurmon Fair, MD Inpatient   01/29/2016 00:31 01/30/2016 18:09 DNR [id number]  Quintella Reichert, MD Inpatient   11/20/2015 22:07 11/22/2015 15:25 DNR [id number]  Briscoe Deutscher, MD ED   10/22/2015 18:23 10/24/2015 14:14 DNR [id number]  Eddie North, MD Inpatient   07/06/2015 18:04 07/10/2015 16:56 Full Code [id number]   Ozella Rocks, MD ED    Advance Directive Documentation     Most Recent Value  Type of Advance Directive  Healthcare Power of Attorney, Living will, Out of facility DNR (pink MOST or yellow form)  Pre-existing out of facility DNR order (yellow form or pink MOST form)  Yellow form placed in chart (order not valid for inpatient use)  "MOST" Form in Place?  No data      TOTAL TIME TAKING CARE OF THIS PATIENT: 39 minutes.    Enid Baas M.D on 08/14/2017 at 9:58 AM  Between 7am to 6pm - Pager - [phone]  After 6pm go to [email or web address] - Social research officer, government  [facility] [place] [facility]  Office  [phone]  CC: Primary care physician; Marguarite Arbour, MD   Note: This dictation was prepared with Dragon dictation along with smaller phrase technology. Any transcriptional errors that result from this process are unintentional.

## 2017-08-14 NOTE — Progress Notes (Signed)
Patient stable for discharge.   PIVs discontinued, catheters intact; site clean, dry, intact. Discharge instructions and follow-up reviewed. Patient verbalized understanding.   Family at bedside to take patient home.

## 2017-08-14 NOTE — Evaluation (Signed)
Occupational Therapy Evaluation Patient Details Name: Leah FishmanDonnie R Small MRN: [id number] DOB: [birth year] Today's Date: 08/14/2017    History of Present Illness Pt is a 76 y.o. female presenting to hospital with chest pain radiating to jaw 08/10/17 and admitted with unstable angina.  08/12/17 code stroke called secondary pt developing L sided weakness, aphasia, and L facial droop when ambulating and transferred to CCU (new PT order received to see pt after transfer to CCU).  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   Clinical Impression   Pt. Presents with LUE weakness, decreased activity tolerance, glaucoma, and Diabetic Retinopathy. Pt. Resides at home with her son, and his girlfriend. Pt. Was independent with ADLs, and IADLs prior to onset. Pt. Has not driven in the past year due to seizures. Pt. had limitations in her LUE from a previous CVA numerous years ago per pt. Report. Pt. Reports she has learned to compensate for the limitations in her LUE.  Pt. Education was provided about energy conservation/work simplification techniques. Pt. Reports she plans to return today with her family to assist as needed. No further OT services are indicated at this time.      Follow Up Recommendations  No OT follow up    Equipment Recommendations       Recommendations for Other Services       Precautions / Restrictions Precautions Precautions: Fall Restrictions Weight Bearing Restrictions: No                                                    ADL either performed or assessed with clinical judgement   ADL Overall ADL's : Needs assistance/impaired Eating/Feeding: Set up   Grooming: Set up   Upper Body Bathing: Set up   Lower Body Bathing: Supervison/ safety       Lower Body Dressing: Supervision/safety               Functional mobility during ADLs: Supervision/safety General ADL Comments: Pt. education was provided about LUE functioning, ADLs, and work simplification strategies.     Vision         Perception     Praxis      Pertinent Vitals/Pain Pain Assessment: No/denies pain     Hand Dominance Right   Extremity/Trunk Assessment Upper Extremity Assessment Upper Extremity Assessment: LUE deficits/detail LUE Deficits / Details: Pt. presents with 3+/5 shoulder flexion, 3-/5 abduction, 4/5 elobow flexion, limited elbow extension.           Communication Communication Communication: No difficulties   Cognition Arousal/Alertness: Awake/alert Behavior During Therapy: WFL for tasks assessed/performed Overall Cognitive Status: Within Functional Limits for tasks assessed                                     General Comments       Exercises     Shoulder Instructions      Home Living Family/patient expects to be discharged to:: Private residence Living Arrangements: Children(Pt. son, and girlfriend) Available Help at Discharge: Family;Available PRN/intermittently Type of Home: House Home Access: Ramped entrance     Home  Layout: One level     Bathroom Shower/Tub: Producer, television/film/videoWalk-in shower   Bathroom Toilet: Handicapped height     Home Equipment: Environmental consultantWalker - 4 wheels;Cane - single point;Shower seat;Grab bars - tub/shower          Prior Functioning/Environment Level of Independence: Independent with assistive device(s)                 OT Problem List: Decreased strength;Impaired UE functional use;Decreased activity tolerance      OT Treatment/Interventions: Self-care/ADL training;Therapeutic exercise;DME and/or AE instruction;Patient/family education    OT Goals(Current goals can be found in the care plan section) Acute Rehab OT Goals Patient Stated Goal: To retun home today OT Goal Formulation:  With patient Potential to Achieve Goals: Good  OT Frequency: Min 1X/week   Barriers to D/C:            Co-evaluation              AM-PAC PT "6 Clicks" Daily Activity     Outcome Measure Help from another person eating meals?: None Help from another person taking care of personal grooming?: None Help from another person toileting, which includes using toliet, bedpan, or urinal?: None Help from another person bathing (including washing, rinsing, drying)?: A Little Help from another person to put on and taking off regular upper body clothing?: None Help from another person to put on and taking off regular lower body clothing?: None 6 Click Score: 23   End of Session    Activity Tolerance: Patient tolerated treatment well Patient left: in bed  OT Visit Diagnosis: Muscle weakness (generalized) (M62.81)                Time: [id number] OT Time Calculation (min): 27 min Charges:  OT General Charges $OT Visit: 1 Visit OT Evaluation $OT Eval Moderate Complexity: 1 Mod G-Codes: OT G-codes **NOT FOR INPATIENT CLASS** Functional Assessment Tool Used: AM-PAC 6 Clicks Daily Activity Functional Limitation: Self care Self Care Current Status (M0102(G8987): At least 20 percent but less than 40 percent impaired, limited or restricted Self Care Goal Status (V2536(G8988): At least 1 percent but less than 20 percent impaired, limited or restricted   Olegario MessierElaine Mao Lockner, MS, OTR/L   Olegario MessierElaine Philmore Lepore, MS, OTR/L 08/14/2017, 1:10 PM

## 2017-11-11 ENCOUNTER — Ambulatory Visit (INDEPENDENT_AMBULATORY_CARE_PROVIDER_SITE_OTHER): Payer: Medicare Other | Admitting: Vascular Surgery

## 2017-11-11 ENCOUNTER — Encounter (INDEPENDENT_AMBULATORY_CARE_PROVIDER_SITE_OTHER): Payer: Medicare Other

## 2017-11-26 IMAGING — DX DG CHEST 2V
2 series · 2 of 2 positions shown · non-contrast
Comparison: July 18, 2015

CLINICAL DATA: Chest pain for 1 day

EXAM:
CHEST  2 VIEW

[chest lat]
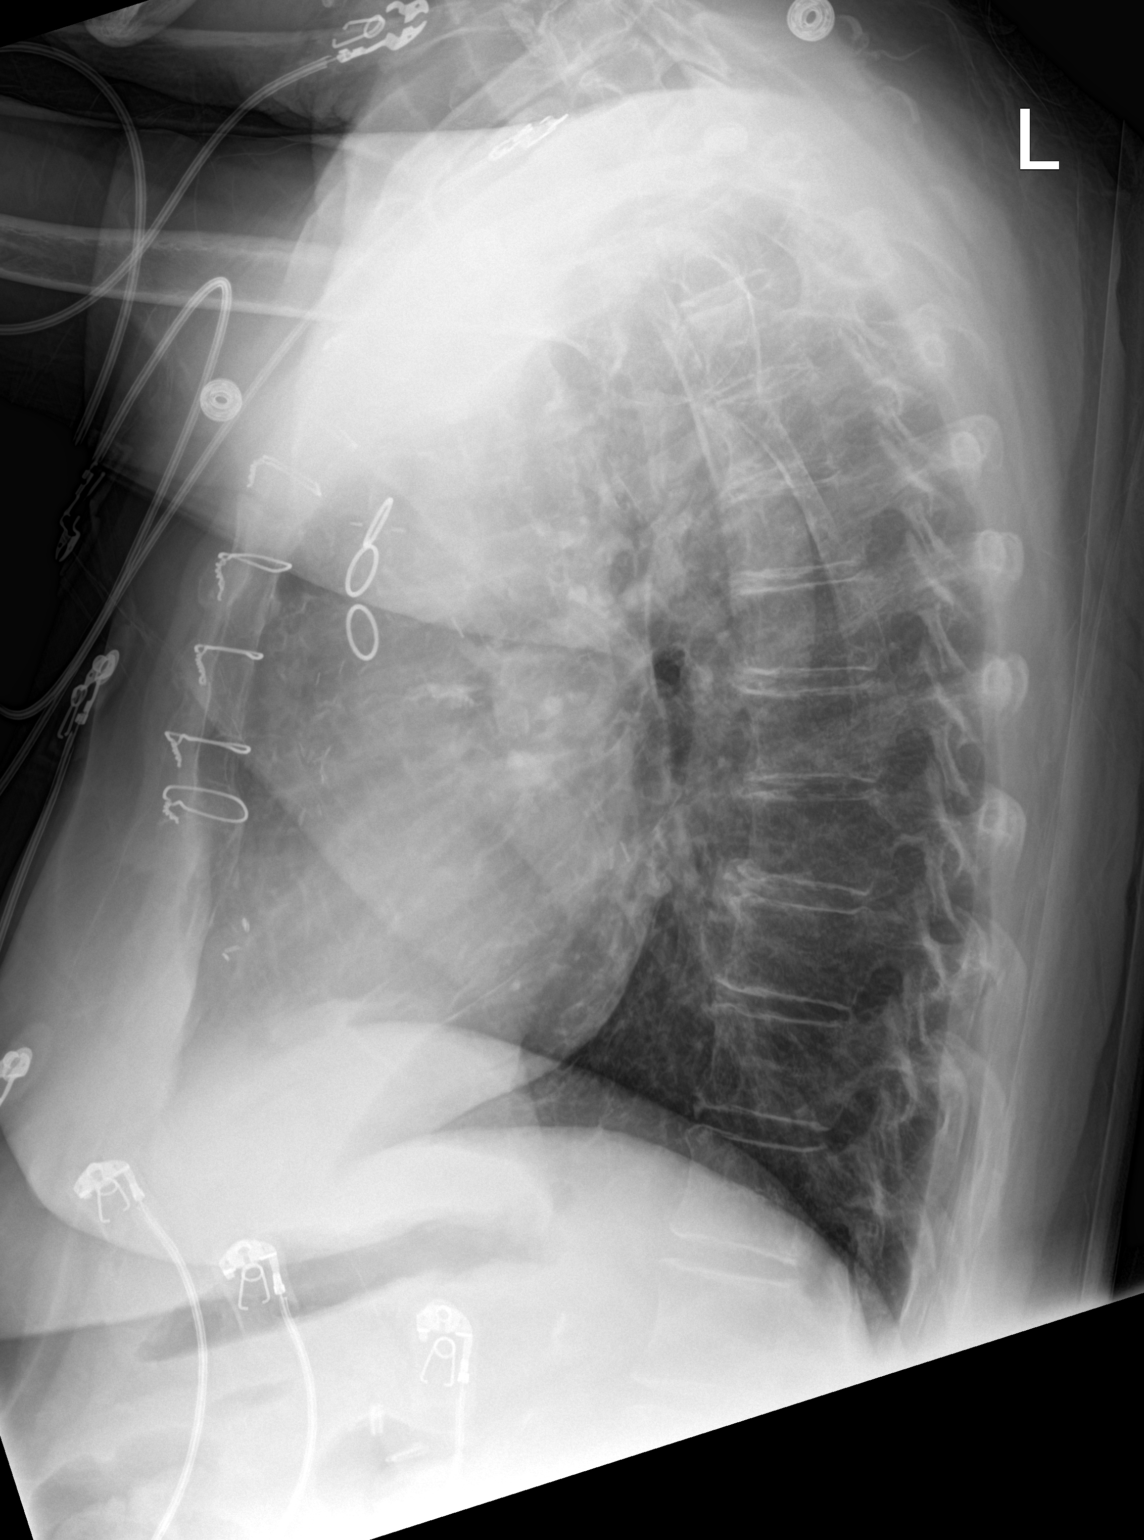

[chest ap]
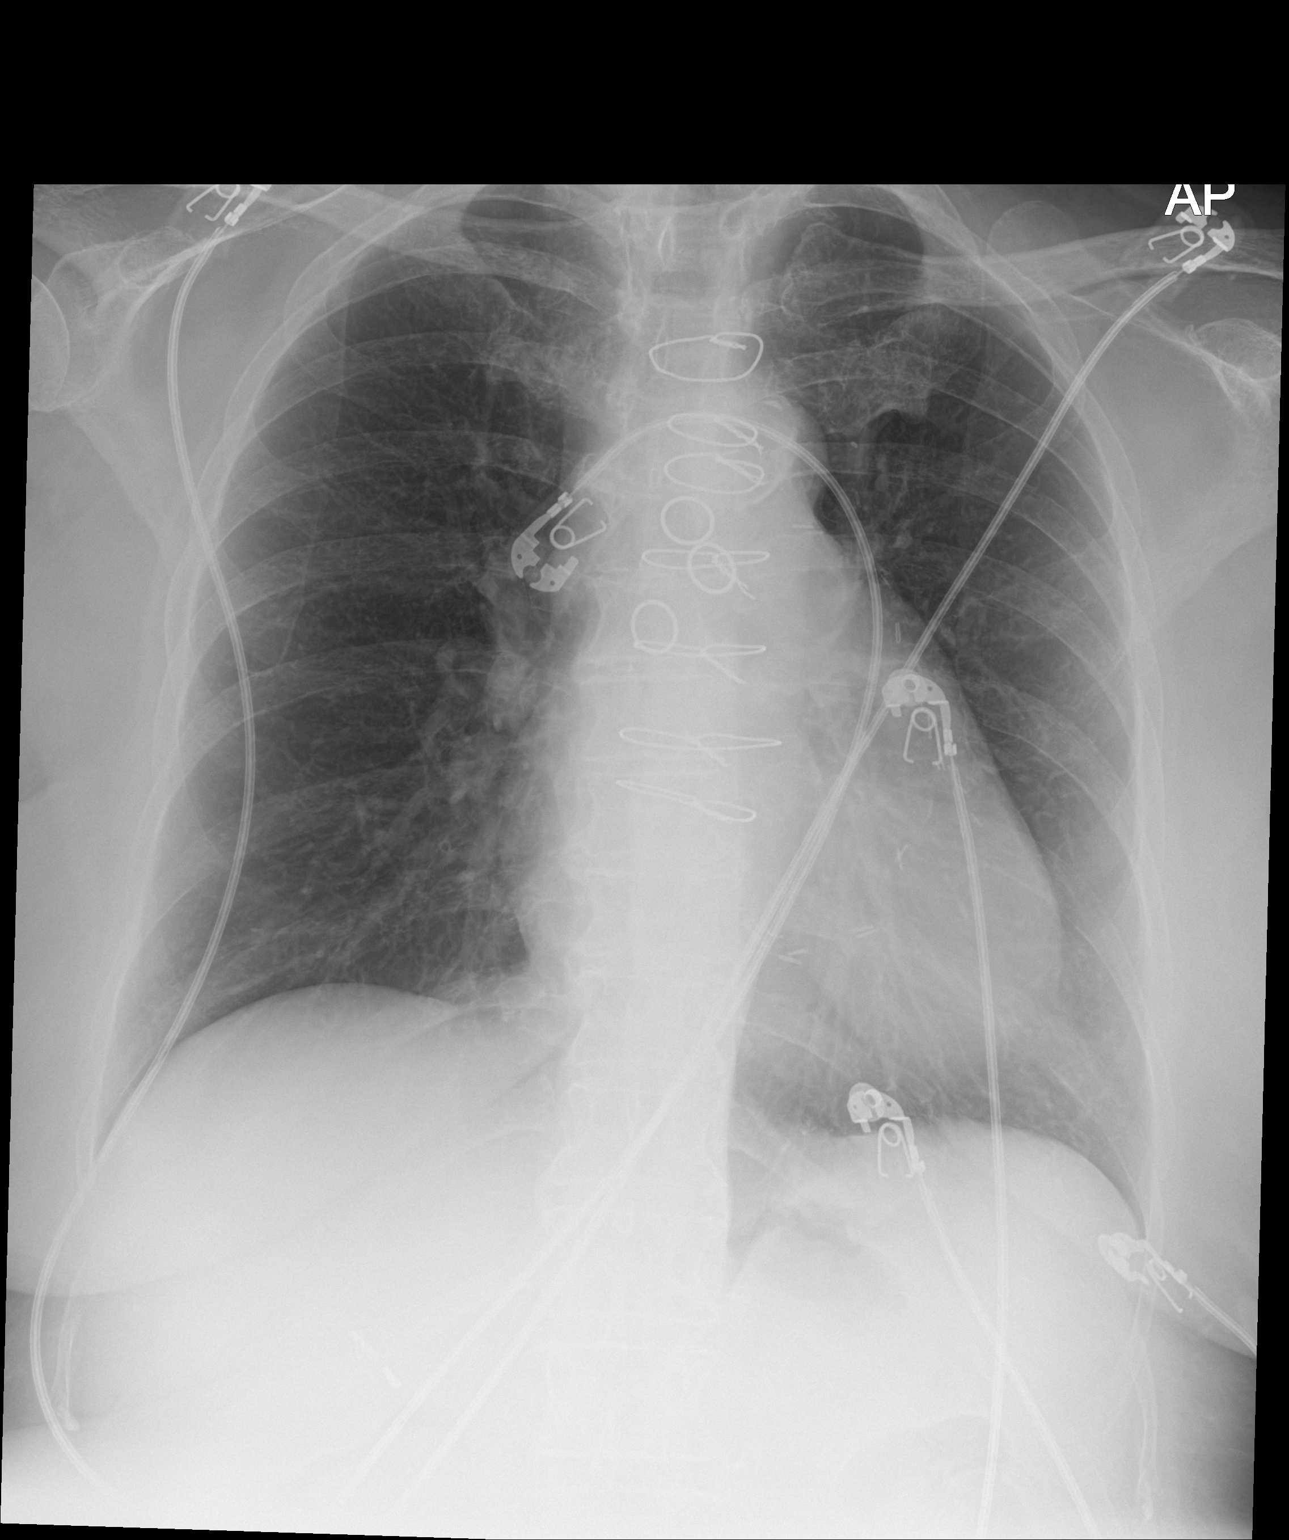

[2 of 2 positions shown; findings below may reference images not displayed]

FINDINGS: There is no edema or consolidation. Heart remains upper normal in
size with pulmonary vascular within normal limits. Patient is status
post coronary artery bypass grafting. No evidence of adenopathy. No
bone lesions. No pneumothorax apparent.
IMPRESSION: No edema or consolidation.  Stable cardiac silhouette.

## 2017-11-28 ENCOUNTER — Ambulatory Visit
Admission: EM | Admit: 2017-11-28 | Discharge: 2017-11-28 | Disposition: A | Discharge status: Home / Self Care | Payer: Medicare Other | Attending: Family Medicine | Admitting: Family Medicine

## 2017-11-28 ENCOUNTER — Other Ambulatory Visit: Payer: Self-pay

## 2017-11-28 ENCOUNTER — Ambulatory Visit (INDEPENDENT_AMBULATORY_CARE_PROVIDER_SITE_OTHER): Payer: Medicare Other

## 2017-11-28 ENCOUNTER — Encounter: Payer: Self-pay | Admitting: Gynecology

## 2017-11-28 DIAGNOSIS — R0781 Pleurodynia: Secondary | ICD-10-CM

## 2017-11-28 DIAGNOSIS — R42 Dizziness and giddiness: Secondary | ICD-10-CM | POA: Diagnosis not present

## 2017-11-28 DIAGNOSIS — J0100 Acute maxillary sinusitis, unspecified: Secondary | ICD-10-CM

## 2017-11-28 DIAGNOSIS — W19XXXA Unspecified fall, initial encounter: Secondary | ICD-10-CM

## 2017-11-28 DIAGNOSIS — R05 Cough: Secondary | ICD-10-CM

## 2017-11-28 DIAGNOSIS — R059 Cough, unspecified: Secondary | ICD-10-CM

## 2017-11-28 MED ORDER — AMOXICILLIN-POT CLAVULANATE 875-125 MG PO TABS: 1.0000 | ORAL_TABLET | Freq: Two times a day (BID) | ORAL | 0 refills | Status: AC

## 2017-11-28 NOTE — Discharge Instructions (Signed)
Recommend start Augmentin 875mg  twice a day as directed. Increase fluid intake to help loosen up mucus. May continue Robitussin DM as needed for cough. Continue to monitor symptoms. Call your PCP tomorrow AM to schedule follow-up for recheck on dizziness- may need repeat lab work and further testing. If worsening of symptoms occurs, go to ER ASAP. Otherwise, follow-up with your PCP as planned.

## 2017-11-28 NOTE — ED Provider Notes (Signed)
[facility]    CSN: [id number] Arrival date & time: 11/28/17  0934     History   Chief Complaint Chief Complaint  Patient presents with  . Dizziness  . Fall    HPI Leah Small is a 77 y.o. female.   77 year old female accompanied by one of her sons with concern over nasal congestion, cough and dizziness for over 2 weeks. Denies any distinct fever, chest pain, shortness of breath or GI symptoms. Has been taking Robitussin for cough with some success. Has felt light-headed and dizzy the past 2 weeks and fell about 1 1/2 weeks ago in the bathroom. No significant injury and recovered completely but last night was in the bathroom, taking a sponge bath, when she felt more dizzy and fell over backwards. Uncertain if LOC but hit the left side of her chest and her left knee. No bruising present on her head or chest but small abrasion present on her left patella. Today left side of her chest is sore and continues with congestion and more sinus pressure. Uncertain if dizziness if coming from sinus pressure or another etiology. Has history of multiple chronic health issues include CAD, HTN, DM, TIA, seizures, hyperlipidemia, arthritis, diabetic retinopathy and on multiple chronic medications. No new medications except new eye drops over 1 month ago. Last office visit 10/14/17 and no significant changes. Last labwork 07/2017. Has next  office visit and labwork scheduled for next month (May 2019).   The history is provided by the patient and a caregiver.    Past Medical History:  Diagnosis Date  . Acid reflux   . Arthritis    "all over"  . Chronic stable angina (HCC)   . Coronary artery disease   . Hypercholesterolemia   . Hypertension   . Myocardial infarction (HCC) 1991; 07/06/2015  . NSTEMI (non-ST elevated myocardial infarction) (HCC) 10/22/2015  . Psoriatic arthritis (HCC)   . Seizures (HCC)    "complex partial; 1st one was 07/06/2015; might have had one today" (10/22/2015)    . TIA (transient ischemic attack)    "several over the last 20 years" (10/22/2015)  . Type II diabetes mellitus [facility])     Patient Active Problem List   Diagnosis Date Noted  . Malnutrition of moderate degree 08/12/2017  . Hypotension   . Carotid stenosis 11/12/2016  . Hypertensive heart disease 01/29/2016  . Normocytic anemia 01/29/2016  . History of vaginal bleeding 01/29/2016  . GERD (gastroesophageal reflux disease) 01/29/2016  . NSTEMI (non-ST elevated myocardial infarction) (HCC) 10/22/2015  . Non-STEMI (non-ST elevated myocardial infarction) (HCC) 10/22/2015  . 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 Coronary atherosclerosis of native coronary artery 10/22/2015  . Angina pectoris (HCC)   . Memory deficit   . Stroke (HCC) 07/06/2015  . Diabetes mellitus with complication (HCC) 07/06/2015  . Essential hypertension 07/06/2015  . Hyperlipidemia 07/06/2015  . Left-sided weakness     Past Surgical History:  Procedure Laterality Date  . ABDOMINAL HYSTERECTOMY  1973  . APPENDECTOMY  1950s   elementary school  . CARDIAC CATHETERIZATION  X 2-3  . CARDIAC CATHETERIZATION N/A 11/21/2015   Procedure: Left Heart Cath and Coronary Angiography;  Surgeon: Corky Crafts, MD;  Location: [facility] INVASIVE CV LAB;  Service: Cardiovascular;  Laterality: N/A;  . CARDIAC CATHETERIZATION  11/21/2015   Procedure: Coronary Stent Intervention;  Surgeon: Corky Crafts, MD;  Location: [facility] INVASIVE CV LAB;  Service: Cardiovascular;;  . CARDIAC CATHETERIZATION N/A 01/29/2016  Procedure: Left Heart Cath and Cors/Grafts Angiography;  Surgeon: Tonny Bollman, MD;  Location: [facility] INVASIVE CV LAB;  Service: Cardiovascular;  Laterality: N/A;  . CARDIAC CATHETERIZATION N/A 07/22/2016   Procedure: Left Heart Cath and Coronary Angiography;  Surgeon: Iran Ouch, MD;  Location: [facility] INVASIVE CV LAB;  Service: Cardiovascular;  Laterality: N/A;  . CAROTID STENT INSERTION Bilateral 2011-2012    right-left  . CATARACT EXTRACTION W/ INTRAOCULAR LENS  IMPLANT, BILATERAL Bilateral 2009-2010  . CHOLECYSTECTOMY OPEN  ~ 2014  . CORONARY ANGIOPLASTY    . CORONARY ANGIOPLASTY WITH STENT PLACEMENT  11/21/2015  . CORONARY ARTERY BYPASS GRAFT  08/1995  . DILATION AND CURETTAGE OF UTERUS  X 1  . 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 TONSILLECTOMY  ~ 1947   2nd grade    OB History   None      Home Medications    Prior to Admission medications   Medication Sig Start Date End Date Taking? Authorizing Provider  Adalimumab (HUMIRA PEN [place]) Inject 1 application into the skin See admin instructions. Patient takes it twice monthly.The next one is due June 26th   Yes [provider]  aspirin EC 81 MG tablet Take 81 mg by mouth at bedtime.   Yes [provider]  atorvastatin (LIPITOR) 80 MG tablet Take 1 tablet (80 mg total) by mouth at bedtime. Patient taking differently: Take 40 mg by mouth at bedtime.  11/22/15  Yes Rai, Ripudeep K, MD  bimatoprost (LUMIGAN) 0.01 % SOLN Place 1 drop into both eyes at bedtime.   Yes [provider]  clopidogrel (PLAVIX) 75 MG tablet Take 1 tablet (75 mg total) by mouth every morning. 08/14/17  Yes Enid Baas, MD  gabapentin (NEURONTIN) 100 MG capsule Take 100-200 mg by mouth daily as needed.   Yes [provider]  Insulin Glargine (LANTUS SOLOSTAR) 100 UNIT/ML Solostar Pen Inject 18 Units into the skin at bedtime. 08/14/17  Yes Enid Baas, MD  insulin lispro (HUMALOG) 100 UNIT/ML injection Inject 0-6 Units into the skin 3 (three) times daily before meals.    Yes [provider]  isosorbide mononitrate (IMDUR) 30 MG 24 hr tablet Take 1 tablet (30 mg total) by mouth daily. 08/14/17  Yes Enid Baas, MD  lamoTRIgine (LAMICTAL) 25 MG tablet Take 3 tablets (75 mg total) by mouth 2 (two) times daily. 08/14/17  Yes Enid Baas, MD    levETIRAcetam (KEPPRA) 750 MG tablet Take 750 mg by mouth 2 (two) times daily.   Yes [provider]  metoprolol tartrate (LOPRESSOR) 25 MG tablet Take 1 tablet (25 mg total) by mouth at bedtime. 07/23/16  Yes Little Ishikawa, NP  nitroGLYCERIN (NITROSTAT) 0.4 MG SL tablet Place 0.4 mg under the tongue every 5 (five) minutes as needed for chest pain.   Yes [provider]  nystatin cream (MYCOSTATIN) Apply 1 application topically 2 (two) times daily as needed (yeast infection).  04/25/15  Yes [provider]  ranitidine (ZANTAC) 150 MG tablet Take 150 mg by mouth every morning.    Yes [provider]  senna-docusate (SENOKOT-S) 8.6-50 MG tablet Take 1 tablet 2 (two) times daily by mouth. 06/29/17  Yes Lewie Loron, NP  timolol (TIMOPTIC) 0.5 % ophthalmic solution Place 1 drop into both eyes 2 (two) times daily. 06/14/17  Yes [provider]  triamcinolone ointment (KENALOG) 0.1 % Apply 1 application topically 3 (three) times daily as needed (  itching from psoriasis).   Yes [provider]  vitamin B-12 (CYANOCOBALAMIN) 1000 MCG tablet Take 500 mcg by mouth every morning.    Yes [provider]  vitamin E 400 UNIT capsule Take 400 Units by mouth every morning.    Yes [provider]  amoxicillin-clavulanate (AUGMENTIN) 875-125 MG tablet Take 1 tablet by mouth every 12 (twelve) hours for 7 days. 11/28/17 12/05/17  Sudie Grumbling, NP  guaiFENesin-dextromethorphan (ROBITUSSIN DM) 100-10 MG/5ML syrup Take 5 mLs every 4 (four) hours as needed by mouth for cough. 06/29/17   Enedina Finner, MD  metFORMIN (GLUCOPHAGE) 1000 MG tablet Take 1 tablet (1,000 mg total) by mouth 2 (two) times daily. 01/30/16   Creig Hines, NP  ranolazine (RANEXA) 500 MG 12 hr tablet Take 1 tablet (500 mg total) by mouth 2 (two) times daily. 08/14/17 10/13/17  Enid Baas, MD    Family History Family History  Problem Relation Age of Onset  .  Heart attack Father   . Diabetes Mother   . Cancer Mother   . Stroke Mother   . Breast cancer Cousin   . Breast cancer Cousin   . Breast cancer Cousin     Social History Social History   Tobacco Use  . Smoking status: Never Smoker  . Smokeless tobacco: Never Used  Substance Use Topics  . Alcohol use: No  . Drug use: No     Allergies   Talwin [pentazocine]; Valium [diazepam]; Vicodin [hydrocodone-acetaminophen]; Adhesive [tape]; Beta adrenergic blockers; Levofloxacin; and Simvastatin   Review of Systems Review of Systems  Constitutional: Positive for fatigue. Negative for activity change, appetite change, chills, diaphoresis and fever.  HENT: Positive for congestion, postnasal drip, rhinorrhea and sinus pressure. Negative for ear discharge, ear pain, facial swelling, mouth sores, nosebleeds, sinus pain, sneezing, sore throat and trouble swallowing.   Eyes: Negative for pain, discharge, redness, itching and visual disturbance.  Respiratory: Positive for cough and chest tightness. Negative for shortness of breath and wheezing.   Cardiovascular: Positive for chest pain (left sided rib pain). Negative for palpitations.  Gastrointestinal: Negative for abdominal pain, diarrhea, nausea and vomiting.  Genitourinary: Negative for decreased urine volume, difficulty urinating and hematuria.  Musculoskeletal: Positive for arthralgias and myalgias. Negative for neck pain and neck stiffness.  Skin: Positive for wound. Negative for rash.  Allergic/Immunologic: Positive for immunocompromised state.  Neurological: Positive for dizziness, seizures (last seizure 2017), syncope, light-headedness and headaches. Negative for tremors, facial asymmetry, speech difficulty, weakness and numbness.  Hematological: Negative for adenopathy. Bruises/bleeds easily.     Physical Exam Triage Vital Signs ED Triage Vitals  Enc Vitals Group     BP 11/28/17 0949 (!) 136/53     Pulse Rate 11/28/17 0949 79      Resp 11/28/17 0949 16     Temp 11/28/17 0949 98 F (36.7 C)     Temp Source 11/28/17 0949 Oral     SpO2 11/28/17 0949 100 %     Weight --      Height --      Head Circumference --      Peak Flow --      Pain Score 11/28/17 0950 2     Pain Loc --      Pain Edu? --      Excl. in GC? --    No data found.  Updated Vital Signs BP (!) 136/53 (BP Location: Left Arm)   Pulse 79   Temp 98 F (36.7 C) (Oral)  Resp 16   SpO2 100%   Visual Acuity Right Eye Distance:   Left Eye Distance:   Bilateral Distance:    Right Eye Near:   Left Eye Near:    Bilateral Near:     Physical Exam  Constitutional: She is oriented to person, place, and time. She appears well-developed and well-nourished. She is cooperative. No distress.  Patient sitting comfortably on exam table in no acute distress. Speaking in complete sentences.   HENT:  Head: Normocephalic and atraumatic. Head is without abrasion, without contusion and without laceration.  Right Ear: Hearing, tympanic membrane, external ear and ear canal normal. Tympanic membrane is not erythematous.  Left Ear: Hearing, tympanic membrane, external ear and ear canal normal. Tympanic membrane is not erythematous.  Nose: Mucosal edema and rhinorrhea present. Right sinus exhibits maxillary sinus tenderness. Right sinus exhibits no frontal sinus tenderness. Left sinus exhibits maxillary sinus tenderness. Left sinus exhibits no frontal sinus tenderness.  Mouth/Throat: Uvula is midline and mucous membranes are normal. Posterior oropharyngeal erythema present.  No bruises, lacerations or swelling present on head or neck.   Eyes: Pupils are equal, round, and reactive to light. Conjunctivae and EOM are normal.  Neck: Normal range of motion. Neck supple. Normal carotid pulses and no JVD present. Carotid bruit is not present.  Cardiovascular: Normal rate, regular rhythm, normal heart sounds and intact distal pulses.  Pulmonary/Chest: Effort normal. No  accessory muscle usage. No respiratory distress. She has decreased breath sounds in the right upper field, the right lower field, the left upper field and the left lower field. She has no wheezes. She has rhonchi in the right upper field and the left upper field. She has no rales. She exhibits tenderness. She exhibits no mass, no laceration, no edema, no deformity and no swelling.  Tender along 10th and 11th left ribs- no swelling, bruising or other injury present.     Musculoskeletal:       Left knee: She exhibits swelling and ecchymosis. She exhibits normal range of motion, no laceration and no erythema.       Legs: Small abrasion present on left patella. Bleeding controlled. Has full range of motion of knee. Minimal swelling.   Lymphadenopathy:    She has no cervical adenopathy.  Neurological: She is alert and oriented to person, place, and time. She has normal strength. No cranial nerve deficit. Gait abnormal.  Slightly unsteady gait but no other significant neuro deficits noted.   Skin: Skin is warm and dry. Capillary refill takes less than 2 seconds. Abrasion (left knee) noted. No bruising, no ecchymosis and no laceration noted.  Psychiatric: She has a normal mood and affect. Her speech is normal and behavior is normal. Judgment and thought content normal. She exhibits normal recent memory.  Vitals reviewed.    UC Treatments / Results  Labs (all labs ordered are listed, but only abnormal results are displayed) Labs Reviewed - No data to display  EKG None Radiology Dg Chest 2 View  Result Date: 11/28/2017 CLINICAL DATA:  77 year old female with cough and congestion for the past 2 weeks. Fall last night with left-sided rib pain. EXAM: CHEST - 2 VIEW COMPARISON:  Prior chest x-ray 08/10/2017 FINDINGS: Stable cardiac mediastinal contours. Patient is status post median sternotomy with evidence of prior multivessel CABG. The lungs are clear. Trace atherosclerotic calcification present in  the transverse aorta. Node evidence of displaced rib fracture. Surgical clips in the right upper quadrant suggest prior cholecystectomy. IMPRESSION: 1. No acute cardiopulmonary  process. 2.  Aortic Atherosclerosis (ICD10-170.0) Electronically Signed   By: Malachy Moan M.D.   On: 11/28/2017 10:38    Procedures Procedures (including critical care time)  Medications Ordered in UC Medications - No data to display   Initial Impression / Assessment and Plan / UC Course  I have reviewed the triage vital signs and the nursing notes.  Pertinent labs & imaging results that were available during my care of the patient were reviewed by me and considered in my medical decision making (see chart for details).    Reviewed chest x-ray results with patient and son. No pneumonia or rib fractures seen. Discussed that dizziness may be due to a sinus infection but may be caused by other etiologies. Diastolic blood pressure is low today. Will treat with antibiotics and continue to monitor. Recommend start Augmentin 875mg  twice a day as directed. Increase fluid intake to help loosen up mucus. May continue Robitussin DM as needed for cough. Continue to monitor symptoms. Patient will call her PCP tomorrow AM to schedule follow-up for recheck on dizziness- may need repeat lab work and further testing. If worsening of symptoms occurs, go to ER ASAP. Otherwise, follow-up with her PCP as planned.    Final Clinical Impressions(s) / UC Diagnoses   Final diagnoses:  Acute non-recurrent maxillary sinusitis  Cough  Dizziness  Fall, initial encounter    ED Discharge Orders        Ordered    amoxicillin-clavulanate (AUGMENTIN) 875-125 MG tablet  Every 12 hours     11/28/17 1056       Controlled Substance Prescriptions [place] Controlled Substance Registry consulted? Not Applicable   Sudie Grumbling, NP 11/28/17 1318

## 2017-11-28 NOTE — ED Triage Notes (Signed)
Patient c/o dizziness on and off x couple weeks. Per patient fallen twice. Patient c/o had fallen last night in her bathroom after feeling dizzy and now with right rib pain. Patient c/o upper respiratory problem.

## 2017-12-17 ENCOUNTER — Emergency Department
Admission: EM | Admit: 2017-12-17 | Discharge: 2017-12-17 | Disposition: A | Discharge status: Home / Self Care | Payer: Medicare Other | Attending: Emergency Medicine | Admitting: Emergency Medicine

## 2017-12-17 ENCOUNTER — Encounter: Payer: Self-pay | Admitting: Emergency Medicine

## 2017-12-17 ENCOUNTER — Emergency Department: Payer: Medicare Other

## 2017-12-17 DIAGNOSIS — R0602 Shortness of breath: Secondary | ICD-10-CM

## 2017-12-17 DIAGNOSIS — J40 Bronchitis, not specified as acute or chronic: Secondary | ICD-10-CM | POA: Diagnosis not present

## 2017-12-17 DIAGNOSIS — Z79899 Other long term (current) drug therapy: Secondary | ICD-10-CM | POA: Diagnosis not present

## 2017-12-17 DIAGNOSIS — I110 Hypertensive heart disease with heart failure: Secondary | ICD-10-CM | POA: Insufficient documentation

## 2017-12-17 DIAGNOSIS — Z7901 Long term (current) use of anticoagulants: Secondary | ICD-10-CM | POA: Insufficient documentation

## 2017-12-17 DIAGNOSIS — I25118 Atherosclerotic heart disease of native coronary artery with other forms of angina pectoris: Secondary | ICD-10-CM | POA: Insufficient documentation

## 2017-12-17 DIAGNOSIS — G9331 Postviral fatigue syndrome: Secondary | ICD-10-CM

## 2017-12-17 DIAGNOSIS — Z794 Long term (current) use of insulin: Secondary | ICD-10-CM | POA: Insufficient documentation

## 2017-12-17 DIAGNOSIS — Z8673 Personal history of transient ischemic attack (TIA), and cerebral infarction without residual deficits: Secondary | ICD-10-CM | POA: Insufficient documentation

## 2017-12-17 DIAGNOSIS — I252 Old myocardial infarction: Secondary | ICD-10-CM | POA: Insufficient documentation

## 2017-12-17 DIAGNOSIS — Z7982 Long term (current) use of aspirin: Secondary | ICD-10-CM | POA: Diagnosis not present

## 2017-12-17 DIAGNOSIS — G933 Postviral fatigue syndrome: Secondary | ICD-10-CM | POA: Diagnosis not present

## 2017-12-17 DIAGNOSIS — I509 Heart failure, unspecified: Secondary | ICD-10-CM | POA: Insufficient documentation

## 2017-12-17 DIAGNOSIS — Z951 Presence of aortocoronary bypass graft: Secondary | ICD-10-CM | POA: Diagnosis not present

## 2017-12-17 DIAGNOSIS — E119 Type 2 diabetes mellitus without complications: Secondary | ICD-10-CM | POA: Diagnosis not present

## 2017-12-17 LAB — BASIC METABOLIC PANEL
ANION GAP: 8 (ref 5–15)
BUN: 14 mg/dL (ref 6–20)
CHLORIDE: 97 mmol/L — AB (ref 101–111)
CO2: 23 mmol/L (ref 22–32)
Calcium: 8.6 mg/dL — ABNORMAL LOW (ref 8.9–10.3)
Creatinine, Ser: 0.67 mg/dL (ref 0.44–1.00)
GFR calc Af Amer: >60 mL/min (ref >60)
GLUCOSE: 131 mg/dL — AB (ref 65–99)
POTASSIUM: 4.5 mmol/L (ref 3.5–5.1)
Sodium: 128 mmol/L — ABNORMAL LOW (ref 135–145)

## 2017-12-17 LAB — CBC
HEMATOCRIT: 29.5 % — AB (ref 35.0–47.0)
HEMOGLOBIN: 9.5 g/dL — AB (ref 12.0–16.0)
MCH: 25.3 pg — ABNORMAL LOW (ref 26.0–34.0)
MCHC: 32.2 g/dL (ref 32.0–36.0)
MCV: 78.4 fL — AB (ref 80.0–100.0)
Platelets: 226 10*3/uL (ref 150–440)
RBC: 3.76 MIL/uL — ABNORMAL LOW (ref 3.80–5.20)
RDW: 17.9 % — AB (ref 11.5–14.5)
WBC: 5.5 10*3/uL (ref 3.6–11.0)

## 2017-12-17 LAB — TROPONIN I: Troponin I: <0.03 ng/mL (ref <0.03)

## 2017-12-17 LAB — BRAIN NATRIURETIC PEPTIDE: B NATRIURETIC PEPTIDE 5: 217 pg/mL — AB (ref 0.0–100.0)

## 2017-12-17 MED ORDER — PANTOPRAZOLE SODIUM 40 MG PO TBEC
DELAYED_RELEASE_TABLET | ORAL | Status: AC
  Filled 2017-12-17: qty 1

## 2017-12-17 MED ORDER — BENZONATATE 100 MG PO CAPS: 100.0000 mg | ORAL_CAPSULE | Freq: Four times a day (QID) | ORAL | 0 refills | Status: AC | PRN

## 2017-12-17 MED ORDER — SODIUM CHLORIDE 0.9 % IV BOLUS
1000.0000 mL | Freq: Once | INTRAVENOUS | Status: AC
  Administered 2017-12-17: 1000 mL via INTRAVENOUS

## 2017-12-17 MED ORDER — FAMOTIDINE 20 MG PO TABS
ORAL_TABLET | ORAL | Status: AC
  Filled 2017-12-17: qty 2

## 2017-12-17 MED ORDER — SODIUM CHLORIDE 0.9 % IV BOLUS
1000.0000 mL | Freq: Once | INTRAVENOUS | Status: AC
Start: 2017-12-17 — End: 2017-12-17
  Administered 2017-12-17: 1000 mL via INTRAVENOUS

## 2017-12-17 MED ORDER — IPRATROPIUM-ALBUTEROL 0.5-2.5 (3) MG/3ML IN SOLN
3.0000 mL | Freq: Once | RESPIRATORY_TRACT | Status: AC
  Administered 2017-12-17: 3 mL via RESPIRATORY_TRACT
  Filled 2017-12-17: qty 3

## 2017-12-17 MED ORDER — ALBUTEROL SULFATE HFA 108 (90 BASE) MCG/ACT IN AERS: 2.0000 | INHALATION_SPRAY | Freq: Four times a day (QID) | RESPIRATORY_TRACT | 0 refills | Status: DC | PRN

## 2017-12-17 MED ORDER — LIDOCAINE HCL (PF) 1 % IJ SOLN
5.0000 mL | Freq: Once | INTRAMUSCULAR | Status: AC
  Administered 2017-12-17: 5 mL
  Filled 2017-12-17: qty 5

## 2017-12-17 NOTE — ED Notes (Signed)
Pt to complete fluid and be discharged post.

## 2017-12-17 NOTE — Discharge Instructions (Signed)
Please make sure you remain well-hydrated and use your inhaler as needed for cough and shortness of breath.  Please continue all of your antibiotics as previously prescribed and return to the emergency department for any concerns whatsoever.  It was a pleasure to take care of you today, and thank you for coming to our emergency department.  If you have any questions or concerns before leaving please ask the nurse to grab me and I'm more than happy to go through your aftercare instructions again.  If you were prescribed any opioid pain medication today such as Norco, Vicodin, Percocet, morphine, hydrocodone, or oxycodone please make sure you do not drive when you are taking this medication as it can alter your ability to drive safely.  If you have any concerns once you are home that you are not improving or are in fact getting worse before you can make it to your follow-up appointment, please do not hesitate to call 911 and come back for further evaluation.  Merrily Brittle, MD  Results for orders placed or performed during the hospital encounter of 12/17/17  Basic metabolic panel  Result Value Ref Range   Sodium 128 (L) 135 - 145 mmol/L   Potassium 4.5 3.5 - 5.1 mmol/L   Chloride 97 (L) 101 - 111 mmol/L   CO2 23 22 - 32 mmol/L   Glucose, Bld 131 (H) 65 - 99 mg/dL   BUN 14 6 - 20 mg/dL   Creatinine, Ser 1.61 0.44 - 1.00 mg/dL   Calcium 8.6 (L) 8.9 - 10.3 mg/dL   GFR calc non Af Amer >60 >60 mL/min   GFR calc Af Amer >60 >60 mL/min   Anion gap 8 5 - 15  CBC  Result Value Ref Range   WBC 5.5 3.6 - 11.0 K/uL   RBC 3.76 (L) 3.80 - 5.20 MIL/uL   Hemoglobin 9.5 (L) 12.0 - 16.0 g/dL   HCT 09.6 (L) 04.5 - 40.9 %   MCV 78.4 (L) 80.0 - 100.0 fL   MCH 25.3 (L) 26.0 - 34.0 pg   MCHC 32.2 32.0 - 36.0 g/dL   RDW 81.1 (H) 91.4 - 78.2 %   Platelets 226 150 - 440 K/uL  Troponin I  Result Value Ref Range   Troponin I <0.03 <0.03 ng/mL  Brain natriuretic peptide  Result Value Ref Range   B  Natriuretic Peptide 217.0 (H) 0.0 - 100.0 pg/mL   Dg Chest 2 View  Result Date: 11/28/2017 CLINICAL DATA:  77 year old female with cough and congestion for the past 2 weeks. Fall last night with left-sided rib pain. EXAM: CHEST - 2 VIEW COMPARISON:  Prior chest x-ray 08/10/2017 FINDINGS: Stable cardiac mediastinal contours. Patient is status post median sternotomy with evidence of prior multivessel CABG. The lungs are clear. Trace atherosclerotic calcification present in the transverse aorta. Node evidence of displaced rib fracture. Surgical clips in the right upper quadrant suggest prior cholecystectomy. IMPRESSION: 1. No acute cardiopulmonary process. 2.  Aortic Atherosclerosis (ICD10-170.0) Electronically Signed   By: Malachy Moan M.D.   On: 11/28/2017 10:38   Dg Chest Port 1 View  Result Date: 12/17/2017 CLINICAL DATA:  Shortness of breath and cough since December. Worsening shortness of breath and cough tonight. EXAM: PORTABLE CHEST 1 VIEW COMPARISON:  Chest radiograph November 28, 2017 FINDINGS: Cardiomediastinal silhouette is normal. Status post median sternotomy for CABG. Calcified aortic knob. No pleural effusions or focal consolidations. Trachea projects midline and there is no pneumothorax. Soft tissue planes and included  osseous structures are non-suspicious. Surgical clips in the included right abdomen compatible with cholecystectomy. IMPRESSION: 1. No acute cardiopulmonary process. 2.  Aortic Atherosclerosis (ICD10-I70.0). Electronically Signed   By: Awilda Metroourtnay  Bloomer M.D.   On: 12/17/2017 04:40

## 2017-12-17 NOTE — ED Triage Notes (Signed)
Pt arrived from home via EMS with SOB and cough. Pts family reports pt has had cough since December and has recently been placed on z-pac. Pt called out tonight due to increased cough and SOB.

## 2017-12-17 NOTE — ED Provider Notes (Signed)
[facility] Emergency Department Provider Note  ____________________________________________   First MD Initiated Contact with Patient 12/17/17 [phone]     (approximate)  I have reviewed the triage vital signs and the nursing notes.   HISTORY  Chief Complaint Shortness of Breath   HPI Leah Small is a 77 y.o. female who comes to the emergency department via EMS with shortness of breath and cough.  She has been sick for roughly 2 or 2-1/2 weeks.  She initially had a low-grade fever rhinorrhea sore throat and dry cough.  She improved initially and now has worsened somewhat.  Her primary care physician began her on azithromycin 3 days ago she feels like it helped somewhat.  She comes to the emergency department tonight because she had a hacking cough and was unable to get up the mucus.  She denies fevers.  She does report sore throat.  Her symptoms are somewhat worse with exertion and somewhat improved with rest.  She has bilateral lateral chest discomfort worse when coughing.  Past Medical History:  Diagnosis Date  . Acid reflux   . Arthritis    "all over"  . Chronic stable angina (HCC)   . Coronary artery disease   . Hypercholesterolemia   . Hypertension   . Myocardial infarction (HCC) 1991; 07/06/2015  . NSTEMI (non-ST elevated myocardial infarction) (HCC) 10/22/2015  . Psoriatic arthritis (HCC)   . 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 TIA (transient ischemic attack)    "several over the last 20 years" (10/22/2015)  . Type II diabetes mellitus [facility](HCC)     Patient Active Problem List   Diagnosis Date Noted  . Malnutrition of moderate degree 08/12/2017  . Hypotension   . Carotid stenosis 11/12/2016  . Hypertensive heart disease 01/29/2016  . Normocytic anemia 01/29/2016  . History of vaginal bleeding 01/29/2016  . GERD (gastroesophageal reflux disease) 01/29/2016  . NSTEMI (non-ST  elevated myocardial infarction) (HCC) 10/22/2015  . Non-STEMI (non-ST elevated myocardial infarction) (HCC) 10/22/2015  . 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 Coronary atherosclerosis of native coronary artery 10/22/2015  . Angina pectoris (HCC)   . Memory deficit   . Stroke (HCC) 07/06/2015  . Diabetes mellitus with complication (HCC) 07/06/2015  . Essential hypertension 07/06/2015  . Hyperlipidemia 07/06/2015  . Left-sided weakness     Past Surgical History:  Procedure Laterality Date  . ABDOMINAL HYSTERECTOMY  1973  . APPENDECTOMY  1950s   elementary school  . CARDIAC CATHETERIZATION  X 2-3  . CARDIAC CATHETERIZATION N/A 11/21/2015   Procedure: Left Heart Cath and Coronary Angiography;  Surgeon: Corky CraftsJayadeep S Varanasi, MD;  [facility];  Service: Cardiovascular;  Laterality: N/A;  . CARDIAC CATHETERIZATION  11/21/2015   Procedure: Coronary Stent Intervention;  Surgeon: Corky CraftsJayadeep S Varanasi, MD;  [facility];  Service: Cardiovascular;;  . CARDIAC CATHETERIZATION N/A 01/29/2016   Procedure: Left Heart Cath and Cors/Grafts Angiography;  Surgeon: Tonny BollmanMichael Cooper, MD;  [facility];  Service: Cardiovascular;  Laterality: N/A;  . CARDIAC CATHETERIZATION N/A 07/22/2016   Procedure: Left Heart Cath and Coronary Angiography;  Surgeon: Iran OuchMuhammad A Arida, MD;  Location: [facility] INVASIVE CV LAB;  Service: Cardiovascular;  Laterality: N/A;  . CAROTID STENT INSERTION Bilateral 2011-2012   right-left  . CATARACT EXTRACTION W/ INTRAOCULAR LENS  IMPLANT, BILATERAL Bilateral 2009-2010  . CHOLECYSTECTOMY OPEN  ~ 2014  . CORONARY  ANGIOPLASTY    . CORONARY ANGIOPLASTY WITH STENT PLACEMENT  11/21/2015  . CORONARY ARTERY BYPASS GRAFT  08/1995  . DILATION AND CURETTAGE OF UTERUS  X 1  . 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 TONSILLECTOMY  ~ 1947   2nd grade    Prior to Admission medications     Medication Sig Start Date End Date Taking? Authorizing Provider  Adalimumab (HUMIRA PEN [place]) Inject 1 application into the skin See admin instructions. Patient takes it twice monthly.The next one is due June 26th    [provider]  albuterol (PROVENTIL HFA;VENTOLIN HFA) 108 (90 Base) MCG/ACT inhaler Inhale 2 puffs into the lungs every 6 (six) hours as needed for wheezing or shortness of breath. 12/17/17   Merrily Brittle, MD  aspirin EC 81 MG tablet Take 81 mg by mouth at bedtime.    [provider]  atorvastatin (LIPITOR) 80 MG tablet Take 1 tablet (80 mg total) by mouth at bedtime. Patient taking differently: Take 40 mg by mouth at bedtime.  11/22/15   Rai, Ripudeep Kirtland Bouchard, MD  benzonatate (TESSALON PERLES) 100 MG capsule Take 1 capsule (100 mg total) by mouth every 6 (six) hours as needed for cough. 12/17/17 12/17/18  Merrily Brittle, MD  bimatoprost (LUMIGAN) 0.01 % SOLN Place 1 drop into both eyes at bedtime.    [provider]  clopidogrel (PLAVIX) 75 MG tablet Take 1 tablet (75 mg total) by mouth every morning. 08/14/17   Enid Baas, MD  gabapentin (NEURONTIN) 100 MG capsule Take 100-200 mg by mouth daily as needed.    [provider]  guaiFENesin-dextromethorphan (ROBITUSSIN DM) 100-10 MG/5ML syrup Take 5 mLs every 4 (four) hours as needed by mouth for cough. 06/29/17   Enedina Finner, MD  Insulin Glargine (LANTUS SOLOSTAR) 100 UNIT/ML Solostar Pen Inject 18 Units into the skin at bedtime. 08/14/17   Enid Baas, MD  insulin lispro (HUMALOG) 100 UNIT/ML injection Inject 0-6 Units into the skin 3 (three) times daily before meals.     [provider]  isosorbide mononitrate (IMDUR) 30 MG 24 hr tablet Take 1 tablet (30 mg total) by mouth daily. 08/14/17   Enid Baas, MD  lamoTRIgine (LAMICTAL) 25 MG tablet Take 3 tablets (75 mg total) by mouth 2 (two) times daily. 08/14/17   Enid Baas, MD  levETIRAcetam (KEPPRA) 750 MG tablet Take  750 mg by mouth 2 (two) times daily.    [provider]  metFORMIN (GLUCOPHAGE) 1000 MG tablet Take 1 tablet (1,000 mg total) by mouth 2 (two) times daily. 01/30/16   Creig Hines, NP  metoprolol tartrate (LOPRESSOR) 25 MG tablet Take 1 tablet (25 mg total) by mouth at bedtime. 07/23/16   Little Ishikawa, NP  nitroGLYCERIN (NITROSTAT) 0.4 MG SL tablet Place 0.4 mg under the tongue every 5 (five) minutes as needed for chest pain.    [provider]  nystatin cream (MYCOSTATIN) Apply 1 application topically 2 (two) times daily as needed (yeast infection).  04/25/15   [provider]  ranitidine (ZANTAC) 150 MG tablet Take 150 mg by mouth every morning.     [provider]  ranolazine (RANEXA) 500 MG 12 hr tablet Take 1 tablet (500 mg total) by mouth 2 (two) times daily. 08/14/17 10/13/17  Enid Baas, MD  senna-docusate (SENOKOT-S) 8.6-50 MG tablet Take 1 tablet 2 (two) times daily by mouth. 06/29/17   Tukov-Yual, Alroy Bailiff, NP  timolol (TIMOPTIC)  0.5 % ophthalmic solution Place 1 drop into both eyes 2 (two) times daily. 06/14/17   [provider]  triamcinolone ointment (KENALOG) 0.1 % Apply 1 application topically 3 (three) times daily as needed (itching from psoriasis).    [provider]  vitamin B-12 (CYANOCOBALAMIN) 1000 MCG tablet Take 500 mcg by mouth every morning.     [provider]  vitamin E 400 UNIT capsule Take 400 Units by mouth every morning.     [provider]    Allergies Talwin [pentazocine]; Valium [diazepam]; Vicodin [hydrocodone-acetaminophen]; Adhesive [tape]; Beta adrenergic blockers; Levofloxacin; and Simvastatin  Family History  Problem Relation Age of Onset  . Heart attack Father   . Diabetes Mother   . Cancer Mother   . Stroke Mother   . Breast cancer Cousin   . Breast cancer Cousin   . Breast cancer Cousin     Social History Social History   Tobacco Use  . Smoking status:  Never Smoker  . Smokeless tobacco: Never Used  Substance Use Topics  . Alcohol use: No  . Drug use: No    Review of Systems Constitutional: No fever/chills Eyes: No visual changes. ENT: Positive for sore throat. Cardiovascular: Positive for chest pain. Respiratory: Positive for shortness of breath. Gastrointestinal: No abdominal pain.  No nausea, no vomiting.  No diarrhea.  No constipation. Genitourinary: Negative for dysuria. Musculoskeletal: Negative for back pain. Skin: Negative for rash. Neurological: Negative for headaches, focal weakness or numbness.   ____________________________________________   PHYSICAL EXAM:  VITAL SIGNS: ED Triage Vitals  Enc Vitals Group     BP 12/17/17 0408 (!) 88/54     Pulse Rate 12/17/17 0408 70     Resp 12/17/17 0408 18     Temp 12/17/17 0408 (!) 97.4 F (36.3 C)     Temp Source 12/17/17 0408 Oral     SpO2 12/17/17 0408 96 %     Weight 12/17/17 0407 137 lb (62.1 kg)     Height --      Head Circumference --      Peak Flow --      Pain Score --      Pain Loc --      Pain Edu? --      Excl. in GC? --     Constitutional: Appears quite uncomfortable speaking in short sentences coughing a wet sounding cough.  Somewhat hoarse voice Eyes: PERRL EOMI. Head: Atraumatic. Nose: No congestion/rhinnorhea. Mouth/Throat: No trismus Neck: No stridor.   Cardiovascular: Normal rate, regular rhythm. Grossly normal heart sounds.  Good peripheral circulation. Respiratory: Increased respiratory effort with wheezing in all fields Gastrointestinal: Soft nontender Musculoskeletal: No lower extremity edema   Neurologic:   No gross focal neurologic deficits are appreciated. Skin:  Skin is warm, dry and intact. No rash noted. Psychiatric: Mood and affect are normal. Speech and behavior are normal.    ____________________________________________   DIFFERENTIAL includes but not limited to  Bronchitis, pneumonia, pneumothorax, pulmonary embolism,  laryngitis ____________________________________________   LABS (all labs ordered are listed, but only abnormal results are displayed)  Labs Reviewed  BASIC METABOLIC PANEL - Abnormal; Notable for the following components:      Result Value   Sodium 128 (*)    Chloride 97 (*)    Glucose, Bld 131 (*)    Calcium 8.6 (*)    All other components within normal limits  CBC - Abnormal; Notable for the following components:   RBC 3.76 (*)  Hemoglobin 9.5 (*)    HCT 29.5 (*)    MCV 78.4 (*)    MCH 25.3 (*)    RDW 17.9 (*)    All other components within normal limits  BRAIN NATRIURETIC PEPTIDE - Abnormal; Notable for the following components:   B Natriuretic Peptide 217.0 (*)    All other components within normal limits  TROPONIN I    Lab work reviewed by me with hypochloremic hyponatremia consistent with dehydration __________________________________________  EKG    ____________________________________________  RADIOLOGY  Chest x-ray reviewed by me with chronic changes but no acute disease ____________________________________________   PROCEDURES  Procedure(s) performed: no  Procedures  Critical Care performed: no  Observation: no ____________________________________________   INITIAL IMPRESSION / ASSESSMENT AND PLAN / ED COURSE  Pertinent labs & imaging results that were available during my care of the patient were reviewed by me and considered in my medical decision making (see chart for details).  The patient arrives somewhat wheezy coughing and short of breath.  Given a breathing treatment with lidocaine in it and then shortly thereafter her symptoms resolved.  Her blood pressure slightly soft will add on a liter of fluid and reevaluate.      __________----------------------------------------- 6:20 AM on 12/17/2017 -----------------------------------------  After breathing treatment the patient's lungs are nearly completely clear.  She is saturating  98% on room air.  I do think is reasonable for her to continue taking her a azithromycin.  The patient's son is an EMT at bedside and understands and agrees with the plan.  Albuterol and Tessalon Perles for symptomatic relief and strict return precautions.  __________________________________   FINAL CLINICAL IMPRESSION(S) / ED DIAGNOSES  Final diagnoses:  Bronchitis  Post viral syndrome      NEW MEDICATIONS STARTED DURING THIS VISIT:  New Prescriptions   ALBUTEROL (PROVENTIL HFA;VENTOLIN HFA) 108 (90 BASE) MCG/ACT INHALER    Inhale 2 puffs into the lungs every 6 (six) hours as needed for wheezing or shortness of breath.   BENZONATATE (TESSALON PERLES) 100 MG CAPSULE    Take 1 capsule (100 mg total) by mouth every 6 (six) hours as needed for cough.     Note:  This document was prepared using Dragon voice recognition software and may include unintentional dictation errors.     Merrily Brittle, MD 12/17/17 [phone]

## 2017-12-17 NOTE — ED Notes (Signed)
E-sign pad not working at this time. Paper discharge document signed.

## 2018-02-01 ENCOUNTER — Other Ambulatory Visit: Payer: Self-pay | Admitting: Internal Medicine

## 2018-02-01 DIAGNOSIS — R0602 Shortness of breath: Secondary | ICD-10-CM

## 2018-02-14 ENCOUNTER — Ambulatory Visit
Admission: RE | Admit: 2018-02-14 | Discharge: 2018-02-14 | Disposition: A | Discharge status: Home / Self Care | Payer: Medicare Other | Source: Ambulatory Visit | Attending: Internal Medicine | Admitting: Internal Medicine

## 2018-02-14 DIAGNOSIS — I2510 Atherosclerotic heart disease of native coronary artery without angina pectoris: Secondary | ICD-10-CM | POA: Insufficient documentation

## 2018-02-14 DIAGNOSIS — R0602 Shortness of breath: Secondary | ICD-10-CM | POA: Insufficient documentation

## 2018-02-14 DIAGNOSIS — R918 Other nonspecific abnormal finding of lung field: Secondary | ICD-10-CM | POA: Diagnosis not present

## 2018-02-14 DIAGNOSIS — I700 Atherosclerosis of aorta: Secondary | ICD-10-CM | POA: Diagnosis not present

## 2018-03-07 IMAGING — DX DG CHEST 2V
2 series · 2 of 2 positions shown · non-contrast
Comparison: January 28, 2016

CLINICAL DATA: Medial chest pain and shortness of breath

EXAM:
CHEST  2 VIEW

[chest pa]
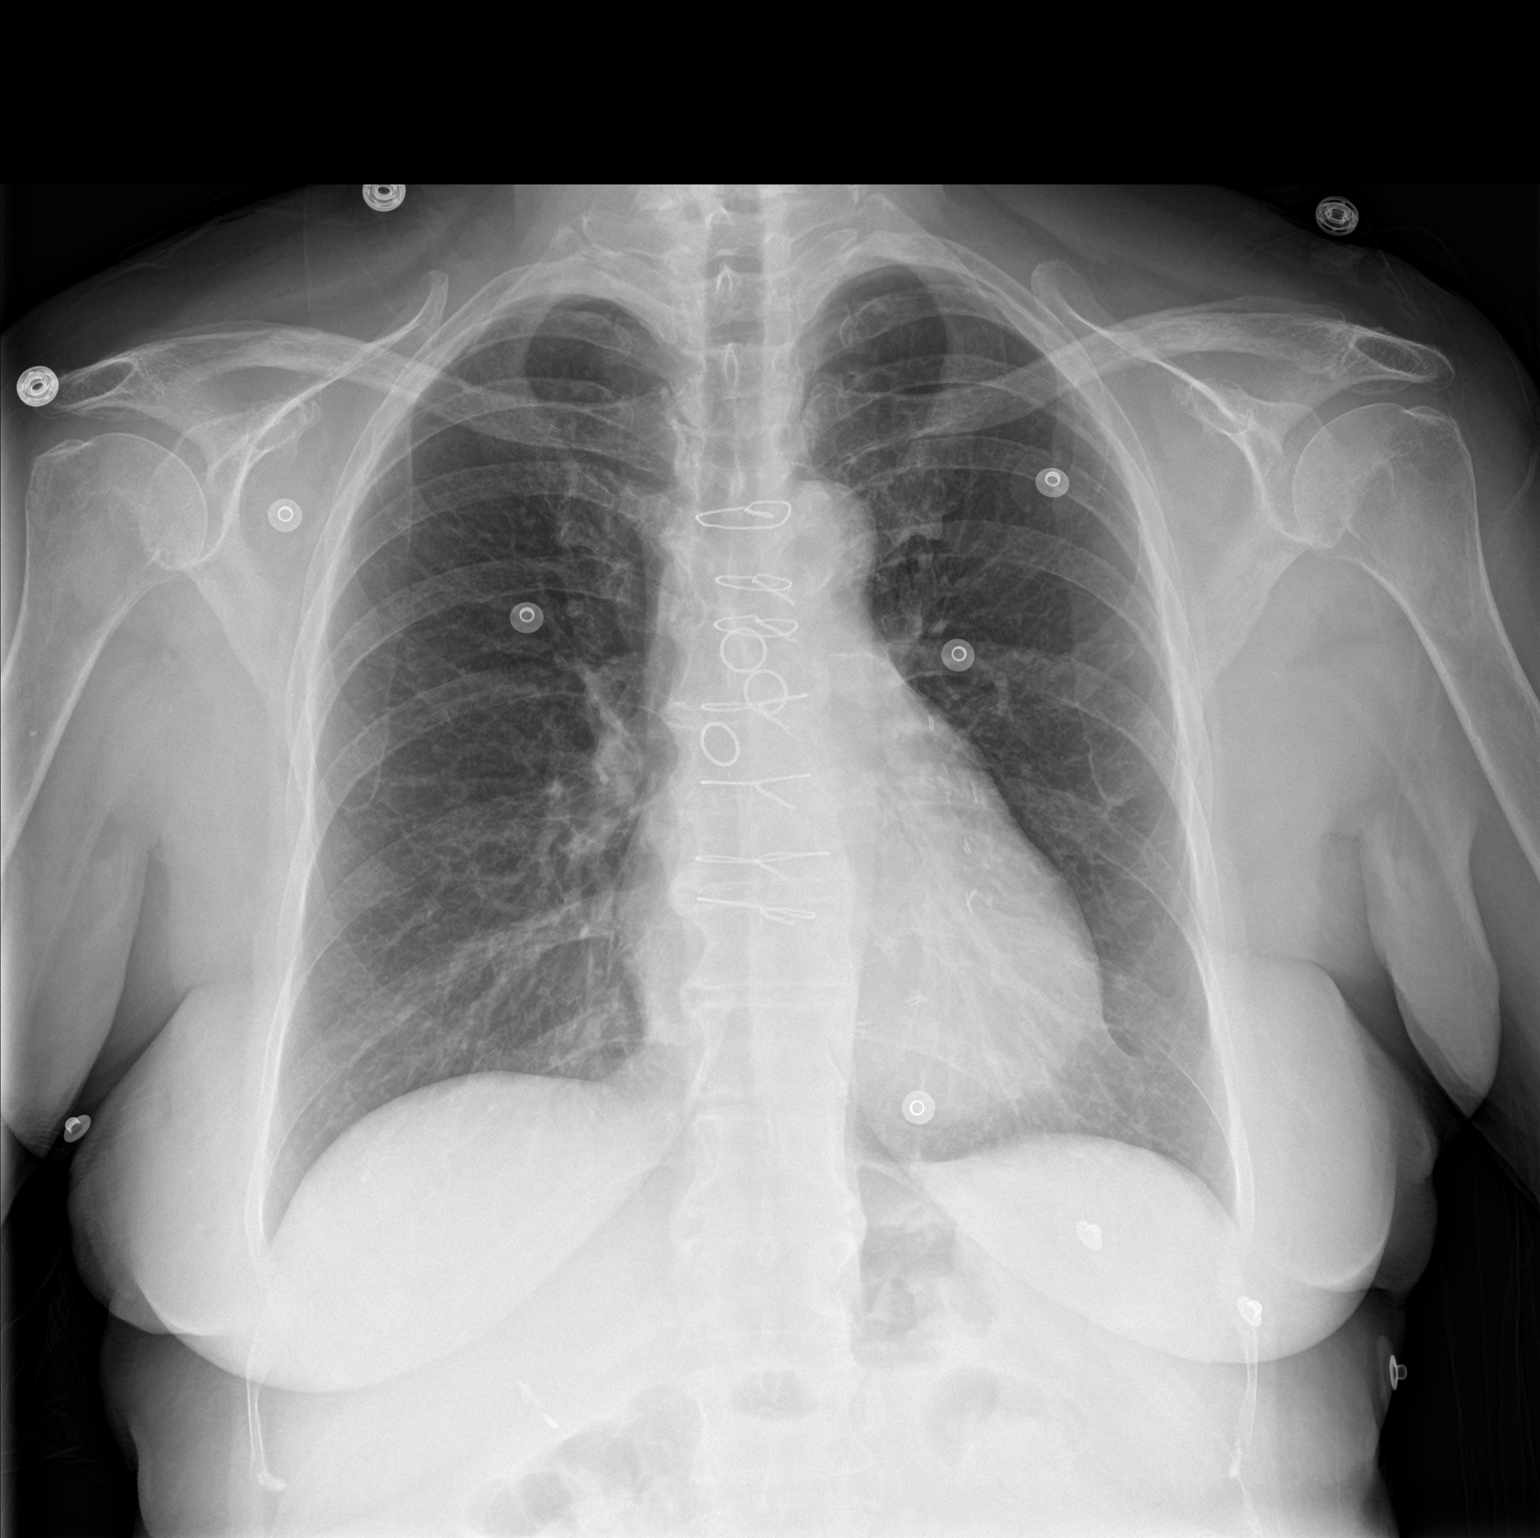

[chest lat]
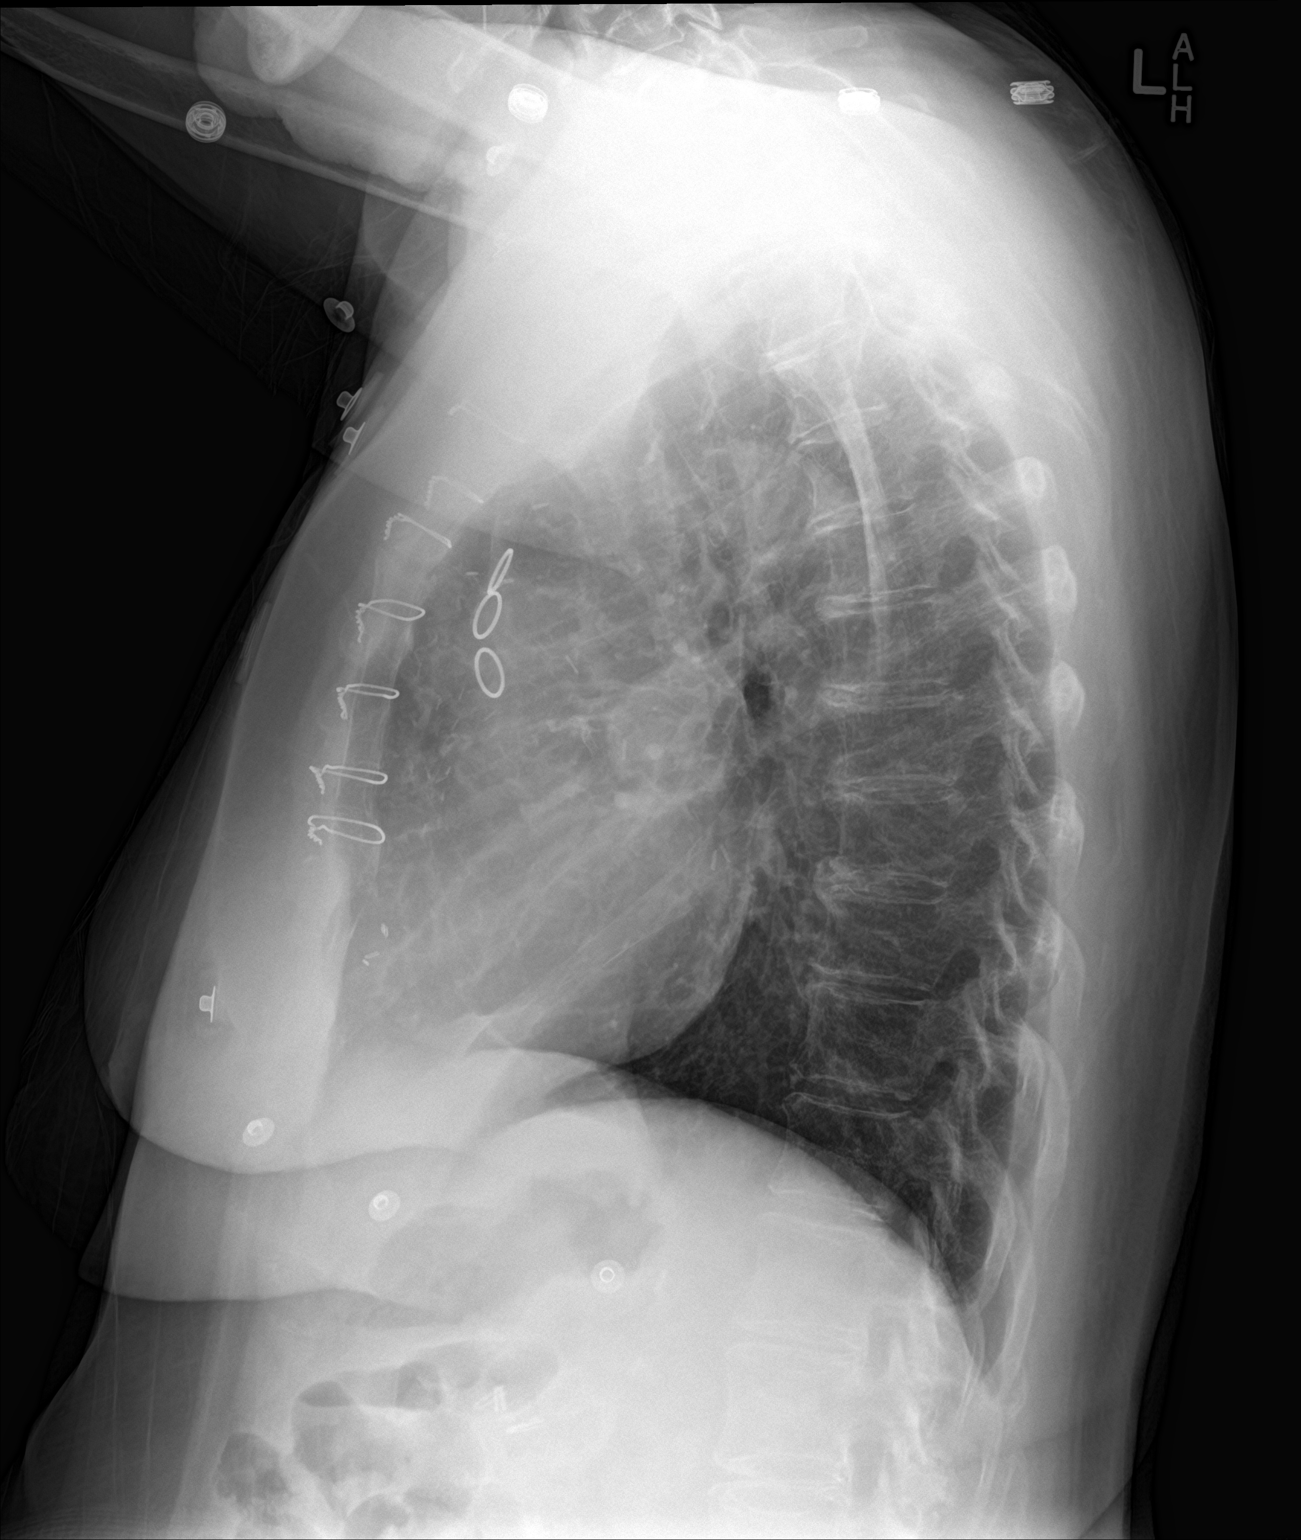

[2 of 2 positions shown; findings below may reference images not displayed]

FINDINGS: The heart size and mediastinal contours are within normal limits.
Both lungs are clear. The visualized skeletal structures are
unremarkable.
IMPRESSION: No acute abnormalities.

## 2018-05-24 ENCOUNTER — Other Ambulatory Visit: Payer: Self-pay | Admitting: Internal Medicine

## 2018-05-24 DIAGNOSIS — I824Y1 Acute embolism and thrombosis of unspecified deep veins of right proximal lower extremity: Secondary | ICD-10-CM

## 2018-05-25 ENCOUNTER — Ambulatory Visit: Payer: Medicare Other

## 2018-05-27 ENCOUNTER — Ambulatory Visit
Admission: RE | Admit: 2018-05-27 | Discharge: 2018-05-27 | Disposition: A | Discharge status: Home / Self Care | Payer: Medicare Other | Source: Ambulatory Visit | Attending: Internal Medicine | Admitting: Internal Medicine

## 2018-05-27 DIAGNOSIS — I824Y1 Acute embolism and thrombosis of unspecified deep veins of right proximal lower extremity: Secondary | ICD-10-CM | POA: Insufficient documentation

## 2019-12-18 ENCOUNTER — Other Ambulatory Visit: Payer: Self-pay

## 2019-12-18 ENCOUNTER — Encounter: Payer: Self-pay | Admitting: Oncology

## 2019-12-18 ENCOUNTER — Inpatient Hospital Stay: Payer: Medicare Other

## 2019-12-18 ENCOUNTER — Inpatient Hospital Stay: Payer: Medicare Other | Attending: Oncology | Admitting: Oncology

## 2019-12-18 VITALS — BP 154/66 | HR 73 | Temp 95.2°F | Resp 16 | Wt 122.0 lb

## 2019-12-18 DIAGNOSIS — Z881 Allergy status to other antibiotic agents status: Secondary | ICD-10-CM | POA: Diagnosis not present

## 2019-12-18 DIAGNOSIS — Z79899 Other long term (current) drug therapy: Secondary | ICD-10-CM

## 2019-12-18 DIAGNOSIS — Z885 Allergy status to narcotic agent status: Secondary | ICD-10-CM

## 2019-12-18 DIAGNOSIS — I119 Hypertensive heart disease without heart failure: Secondary | ICD-10-CM | POA: Diagnosis not present

## 2019-12-18 DIAGNOSIS — Z803 Family history of malignant neoplasm of breast: Secondary | ICD-10-CM | POA: Diagnosis not present

## 2019-12-18 DIAGNOSIS — E119 Type 2 diabetes mellitus without complications: Secondary | ICD-10-CM

## 2019-12-18 DIAGNOSIS — R5383 Other fatigue: Secondary | ICD-10-CM | POA: Diagnosis not present

## 2019-12-18 DIAGNOSIS — I252 Old myocardial infarction: Secondary | ICD-10-CM | POA: Diagnosis not present

## 2019-12-18 DIAGNOSIS — K219 Gastro-esophageal reflux disease without esophagitis: Secondary | ICD-10-CM

## 2019-12-18 DIAGNOSIS — E785 Hyperlipidemia, unspecified: Secondary | ICD-10-CM | POA: Diagnosis not present

## 2019-12-18 DIAGNOSIS — Z888 Allergy status to other drugs, medicaments and biological substances status: Secondary | ICD-10-CM

## 2019-12-18 DIAGNOSIS — Z8673 Personal history of transient ischemic attack (TIA), and cerebral infarction without residual deficits: Secondary | ICD-10-CM | POA: Diagnosis not present

## 2019-12-18 DIAGNOSIS — D649 Anemia, unspecified: Secondary | ICD-10-CM | POA: Diagnosis not present

## 2019-12-18 DIAGNOSIS — Z809 Family history of malignant neoplasm, unspecified: Secondary | ICD-10-CM | POA: Diagnosis not present

## 2019-12-18 DIAGNOSIS — Z596 Low income: Secondary | ICD-10-CM | POA: Diagnosis not present

## 2019-12-18 DIAGNOSIS — Z833 Family history of diabetes mellitus: Secondary | ICD-10-CM

## 2019-12-18 DIAGNOSIS — M1990 Unspecified osteoarthritis, unspecified site: Secondary | ICD-10-CM | POA: Diagnosis not present

## 2019-12-18 DIAGNOSIS — Z8249 Family history of ischemic heart disease and other diseases of the circulatory system: Secondary | ICD-10-CM | POA: Diagnosis not present

## 2019-12-18 DIAGNOSIS — R002 Palpitations: Secondary | ICD-10-CM | POA: Diagnosis not present

## 2019-12-18 DIAGNOSIS — I25119 Atherosclerotic heart disease of native coronary artery with unspecified angina pectoris: Secondary | ICD-10-CM

## 2019-12-18 LAB — CBC WITH DIFFERENTIAL/PLATELET
Abs Immature Granulocytes: 0.11 10*3/uL — ABNORMAL HIGH (ref 0.00–0.07)
Basophils Absolute: 0.1 10*3/uL (ref 0.0–0.1)
Basophils Relative: 1 %
Eosinophils Absolute: 0.1 10*3/uL (ref 0.0–0.5)
Eosinophils Relative: 1 %
HCT: 27.8 % — ABNORMAL LOW (ref 36.0–46.0)
Hemoglobin: 8.5 g/dL — ABNORMAL LOW (ref 12.0–15.0)
Immature Granulocytes: 2 %
Lymphocytes Relative: 18 %
Lymphs Abs: 1.3 10*3/uL (ref 0.7–4.0)
MCH: 30.4 pg (ref 26.0–34.0)
MCHC: 30.6 g/dL (ref 30.0–36.0)
MCV: 99.3 fL (ref 80.0–100.0)
Monocytes Absolute: 0.9 10*3/uL (ref 0.1–1.0)
Monocytes Relative: 12 %
Neutro Abs: 5 10*3/uL (ref 1.7–7.7)
Neutrophils Relative %: 66 %
Platelets: 537 10*3/uL — ABNORMAL HIGH (ref 150–400)
RBC: 2.8 MIL/uL — ABNORMAL LOW (ref 3.87–5.11)
RDW: 18.5 % — ABNORMAL HIGH (ref 11.5–15.5)
WBC: 7.4 10*3/uL (ref 4.0–10.5)
nRBC: 0 % (ref 0.0–0.2)

## 2019-12-18 LAB — COMPREHENSIVE METABOLIC PANEL
ALT: 14 U/L (ref 0–44)
AST: 16 U/L (ref 15–41)
Albumin: 4.4 g/dL (ref 3.5–5.0)
Alkaline Phosphatase: 87 U/L (ref 38–126)
Anion gap: 12 (ref 5–15)
BUN: 24 mg/dL — ABNORMAL HIGH (ref 8–23)
CO2: 21 mmol/L — ABNORMAL LOW (ref 22–32)
Calcium: 9.5 mg/dL (ref 8.9–10.3)
Chloride: 100 mmol/L (ref 98–111)
Creatinine, Ser: 1.13 mg/dL — ABNORMAL HIGH (ref 0.44–1.00)
GFR calc Af Amer: 54 mL/min — ABNORMAL LOW (ref >60)
GFR calc non Af Amer: 46 mL/min — ABNORMAL LOW (ref >60)
Glucose, Bld: 224 mg/dL — ABNORMAL HIGH (ref 70–99)
Potassium: 5 mmol/L (ref 3.5–5.1)
Sodium: 133 mmol/L — ABNORMAL LOW (ref 135–145)
Total Bilirubin: 0.7 mg/dL (ref 0.3–1.2)
Total Protein: 8.5 g/dL — ABNORMAL HIGH (ref 6.5–8.1)

## 2019-12-18 LAB — VITAMIN B12: Vitamin B-12: 2292 pg/mL — ABNORMAL HIGH (ref 180–914)

## 2019-12-18 LAB — FOLATE: Folate: 6.7 ng/mL (ref >5.9)

## 2019-12-18 LAB — LACTATE DEHYDROGENASE: LDH: 154 U/L (ref 98–192)

## 2019-12-18 LAB — IRON AND TIBC
Iron: 57 ug/dL (ref 28–170)
Saturation Ratios: 11 % (ref 10.4–31.8)
TIBC: 545 ug/dL — ABNORMAL HIGH (ref 250–450)
UIBC: 488 ug/dL

## 2019-12-18 LAB — DAT, POLYSPECIFIC AHG (ARMC ONLY): Polyspecific AHG test: NEGATIVE

## 2019-12-18 LAB — FERRITIN: Ferritin: 11 ng/mL (ref 11–307)

## 2019-12-18 LAB — TSH: TSH: 3.967 u[IU]/mL (ref 0.350–4.500)

## 2019-12-18 LAB — RETICULOCYTES
Immature Retic Fract: 16.4 % — ABNORMAL HIGH (ref 2.3–15.9)
RBC.: 2.65 MIL/uL — ABNORMAL LOW (ref 3.87–5.11)
Retic Count, Absolute: 133.5 10*3/uL (ref 19.0–186.0)
Retic Ct Pct: 5 % — ABNORMAL HIGH (ref 0.4–3.1)

## 2019-12-18 NOTE — Progress Notes (Signed)
Hematology/Oncology Consult note [facility] Telephone:([phone] [phone]  Patient Care Team: Idelle Crouch, MD as PCP - General (Internal Medicine)   Name of the patient: Leah Small  [id number]  April 10, 1941    Reason for referral-anemia   Referring physician-Dr. Doy Hutching  Date of visit: 12/18/19   History of presenting illness- Patient is a 79 year old female with a past medical history significant for coronary artery disease, CVA, CABG, hypertension hyperlipidemia diabetes GERD among other medical problems.  She was recently seen by cardiology for fatigue and palpitations.  She has been referred to hematology for anemia.  Most recent CBC from 12/04/2019 showed white cell count of 5.7, H&H of 7.5/23.3 and a platelet count of 162.  MCV was 99.  Looking back at her prior CBCs patient has had chronic anemia with a hemoglobin which was around 10 up until January 2021 after which it dropped to 7.8.  CMP showed normal liver and kidney functions.  Hepatitis B and C testing has been negative.  TSH in January was normal.  Patient reports ongoing fatigue.  Denies any blood loss in her stool or urine.  Denies any dark melanotic stools.  ECOG PS- 1  Pain scale- 0   Review of systems- Review of Systems  Constitutional: Positive for malaise/fatigue. Negative for chills, fever and weight loss.  HENT: Negative for congestion, ear discharge and nosebleeds.   Eyes: Negative for blurred vision.  Respiratory: Negative for cough, hemoptysis, sputum production, shortness of breath and wheezing.   Cardiovascular: Negative for chest pain, palpitations, orthopnea and claudication.  Gastrointestinal: Negative for abdominal pain, blood in stool, constipation, diarrhea, heartburn, melena, nausea and vomiting.  Genitourinary: Negative for dysuria, flank pain, frequency, hematuria and urgency.  Musculoskeletal: Negative for back pain, joint pain and myalgias.  Skin:  Negative for rash.  Neurological: Negative for dizziness, tingling, focal weakness, seizures, weakness and headaches.  Endo/Heme/Allergies: Does not bruise/bleed easily.  Psychiatric/Behavioral: Negative for depression and suicidal ideas. The patient does not have insomnia.     Allergies  Allergen Reactions  . Talwin [Pentazocine] Anaphylaxis and Swelling    Throat swelling  . Valium [Diazepam] Other (See Comments)    Makes her feel like she's flying  . Vicodin [Hydrocodone-Acetaminophen] Nausea And Vomiting  . Adhesive [Tape] Other (See Comments)    Bruising and TEARING!!!  Please use "paper" tape  . Beta Adrenergic Blockers Other (See Comments)    Pt states she not allergic  . Levofloxacin Swelling and Other (See Comments)    Tongue swells   . Simvastatin Swelling and Rash    Mouth swelling    Patient Active Problem List   Diagnosis Date Noted  . Malnutrition of moderate degree 08/12/2017  . Hypotension   . Carotid stenosis 11/12/2016  . Hypertensive heart disease 01/29/2016  . Normocytic anemia 01/29/2016  . History of vaginal bleeding 01/29/2016  . GERD (gastroesophageal reflux disease) 01/29/2016  . NSTEMI (non-ST elevated myocardial infarction) ([place]) 10/22/2015  . Non-STEMI (non-ST elevated myocardial infarction) ([place]) 10/22/2015  . 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 Coronary atherosclerosis of native coronary artery 10/22/2015  . Angina pectoris ([place])   . Memory deficit   . Stroke ([place]) 07/06/2015  . Diabetes mellitus with complication ([place]) 87/68/1157  . Essential hypertension 07/06/2015  . Hyperlipidemia 07/06/2015  . Left-sided weakness      Past Medical History:  Diagnosis Date  . Acid reflux   . Arthritis    "all over"  . Chronic  stable angina ([place])   . Coronary artery disease   . Hypercholesterolemia   . Hypertension   . Myocardial infarction ([place]) 1991; 07/06/2015  . NSTEMI (non-ST elevated myocardial infarction) ([place]) 10/22/2015   . Psoriatic arthritis ([place])   . Seizures ([place])    "complex partial; 1st one was 07/06/2015; might have had one today" (10/22/2015)  . TIA (transient ischemic attack)    "several over the last 20 years" (10/22/2015)  . Type II diabetes mellitus ([place])      Past Surgical History:  Procedure Laterality Date  . ABDOMINAL HYSTERECTOMY  1973  . APPENDECTOMY  1950s   elementary school  . CARDIAC CATHETERIZATION  X 2-3  . CARDIAC CATHETERIZATION N/A 11/21/2015   Procedure: Left Heart Cath and Coronary Angiography;  Surgeon: Jettie Booze, MD;  Location: [place] [facility];  Service: Cardiovascular;  Laterality: N/A;  . CARDIAC CATHETERIZATION  11/21/2015   Procedure: Coronary Stent Intervention;  Surgeon: Jettie Booze, MD;  Location: [place] [facility];  Service: Cardiovascular;;  . CARDIAC CATHETERIZATION N/A 01/29/2016   Procedure: Left Heart Cath and Cors/Grafts Angiography;  Surgeon: Sherren Mocha, MD;  Location: [place] [facility];  Service: Cardiovascular;  Laterality: N/A;  . CARDIAC CATHETERIZATION N/A 07/22/2016   Procedure: Left Heart Cath and Coronary Angiography;  Surgeon: Wellington Hampshire, MD;  Location: [place] [facility];  Service: Cardiovascular;  Laterality: N/A;  . CAROTID STENT INSERTION Bilateral 2011-2012   right-left  . CATARACT EXTRACTION W/ INTRAOCULAR LENS  IMPLANT, BILATERAL Bilateral 2009-2010  . CHOLECYSTECTOMY OPEN  ~ 2014  . CORONARY ANGIOPLASTY    . CORONARY ANGIOPLASTY WITH STENT PLACEMENT  11/21/2015  . CORONARY ARTERY BYPASS GRAFT  08/1995  . DILATION AND CURETTAGE OF UTERUS  X 1  . 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 TONSILLECTOMY  ~ 1947   2nd grade    Social History   Socioeconomic History  . Marital status: Widowed    Spouse name: Not on file  . Number of children: Not on file  . Years of education: Not on file  . Highest education level: Not on file  Occupational History  . Not  on file  Tobacco Use  . Smoking status: Never Smoker  . Smokeless tobacco: Never Used  Substance and Sexual Activity  . Alcohol use: No  . Drug use: No  . Sexual activity: Never  Other Topics Concern  . Not on file  Social History Narrative   Lives at home with one of her sons. Independent at baseline.   Social Determinants of Health   Financial Resource Strain:   . Difficulty of Paying Living Expenses:   Food Insecurity:   . Worried About Charity fundraiser in the Last Year:   . Arboriculturist in the Last Year:   Transportation Needs:   . Film/video editor (Medical):   [name] Lack of Transportation (Non-Medical):   Physical Activity:   . Days of Exercise per Week:   . Minutes of Exercise per Session:   Stress:   . Feeling of Stress :   Social Connections:   . Frequency of Communication with Friends and Family:   . Frequency of Social Gatherings with Friends and Family:   . Attends Religious Services:   . Active Member of Clubs or Organizations:   . Attends Archivist Meetings:   [name] Marital Status:   Intimate Partner Violence:   .  Fear of Current or Ex-Partner:   . Emotionally Abused:   [name] Physically Abused:   . Sexually Abused:      Family History  Problem Relation Age of Onset  . Heart attack Father   . Diabetes Mother   . Cancer Mother   . Stroke Mother   . Breast cancer Cousin   . Breast cancer Cousin   . Breast cancer Cousin      Current Outpatient Medications:  .  Adalimumab (HUMIRA PEN [place]), Inject 1 application into the skin See admin instructions. Patient takes it twice monthly.The next one is due June 26th, Disp: , Rfl:  .  albuterol (PROVENTIL HFA;VENTOLIN HFA) 108 (90 Base) MCG/ACT inhaler, Inhale 2 puffs into the lungs every 6 (six) hours as needed for wheezing or shortness of breath., Disp: 1 Inhaler, Rfl: 0 .  aspirin EC 81 MG tablet, Take 81 mg by mouth at bedtime., Disp: , Rfl:  .  atorvastatin (LIPITOR) 80 MG tablet, Take 1 tablet  (80 mg total) by mouth at bedtime. (Patient taking differently: Take 40 mg by mouth at bedtime. ), Disp: 30 tablet, Rfl: 3 .  bimatoprost (LUMIGAN) 0.01 % SOLN, Place 1 drop into both eyes at bedtime., Disp: , Rfl:  .  clopidogrel (PLAVIX) 75 MG tablet, Take 1 tablet (75 mg total) by mouth every morning., Disp: 30 tablet, Rfl: 2 .  gabapentin (NEURONTIN) 100 MG capsule, Take 100-200 mg by mouth daily as needed., Disp: , Rfl:  .  guaiFENesin-dextromethorphan (ROBITUSSIN DM) 100-10 MG/5ML syrup, Take 5 mLs every 4 (four) hours as needed by mouth for cough., Disp: 118 mL, Rfl: 0 .  Insulin Glargine (LANTUS SOLOSTAR) 100 UNIT/ML Solostar Pen, Inject 18 Units into the skin at bedtime., Disp: 15 mL, Rfl: 11 .  insulin lispro (HUMALOG) 100 UNIT/ML injection, Inject 0-6 Units into the skin 3 (three) times daily before meals. , Disp: , Rfl:  .  isosorbide mononitrate (IMDUR) 30 MG 24 hr tablet, Take 1 tablet (30 mg total) by mouth daily., Disp: 30 tablet, Rfl: 2 .  lamoTRIgine (LAMICTAL) 25 MG tablet, Take 3 tablets (75 mg total) by mouth 2 (two) times daily., Disp: 90 tablet, Rfl: 2 .  levETIRAcetam (KEPPRA) 750 MG tablet, Take 750 mg by mouth 2 (two) times daily., Disp: , Rfl:  .  metFORMIN (GLUCOPHAGE) 1000 MG tablet, Take 1 tablet (1,000 mg total) by mouth 2 (two) times daily., Disp: , Rfl:  .  metoprolol tartrate (LOPRESSOR) 25 MG tablet, Take 1 tablet (25 mg total) by mouth at bedtime., Disp: 30 tablet, Rfl: 12 .  nitroGLYCERIN (NITROSTAT) 0.4 MG SL tablet, Place 0.4 mg under the tongue every 5 (five) minutes as needed for chest pain., Disp: , Rfl:  .  nystatin cream (MYCOSTATIN), Apply 1 application topically 2 (two) times daily as needed (yeast infection). , Disp: , Rfl:  .  ranitidine (ZANTAC) 150 MG tablet, Take 150 mg by mouth every morning. , Disp: , Rfl:  .  ranolazine (RANEXA) 500 MG 12 hr tablet, Take 1 tablet (500 mg total) by mouth 2 (two) times daily., Disp: 60 tablet, Rfl: 1 .   senna-docusate (SENOKOT-S) 8.6-50 MG tablet, Take 1 tablet 2 (two) times daily by mouth., Disp: 1 tablet, Rfl:  .  timolol (TIMOPTIC) 0.5 % ophthalmic solution, Place 1 drop into both eyes 2 (two) times daily., Disp: , Rfl:  .  triamcinolone ointment (KENALOG) 0.1 %, Apply 1 application topically 3 (three) times daily as needed (itching  from psoriasis)., Disp: , Rfl:  .  vitamin B-12 (CYANOCOBALAMIN) 1000 MCG tablet, Take 500 mcg by mouth every morning. , Disp: , Rfl:  .  vitamin E 400 UNIT capsule, Take 400 Units by mouth every morning. , Disp: , Rfl:    Physical exam:  Vitals:   12/18/19 0942  BP: (!) 154/66  Pulse: 73  Resp: 16  Temp: (!) 95.2 F (35.1 C)  TempSrc: Oral  SpO2: 100%  Weight: 122 lb (55.3 kg)   Physical Exam Constitutional:      General: She is not in acute distress. HENT:     Head: Normocephalic and atraumatic.  Eyes:     Pupils: Pupils are equal, round, and reactive to light.  Cardiovascular:     Rate and Rhythm: Normal rate and regular rhythm.     Heart sounds: Normal heart sounds.  Pulmonary:     Effort: Pulmonary effort is normal.     Breath sounds: Normal breath sounds.  Abdominal:     General: Bowel sounds are normal.     Palpations: Abdomen is soft.  Musculoskeletal:     Cervical back: Normal range of motion.  Skin:    General: Skin is warm and dry.  Neurological:     Mental Status: She is alert and oriented to person, place, and time.        CMP Latest Ref Rng & Units 12/17/2017  Glucose 65 - 99 mg/dL 131(H)  BUN 6 - 20 mg/dL 14  Creatinine 0.44 - 1.00 mg/dL 0.67  Sodium 135 - 145 mmol/L 128(L)  Potassium 3.5 - 5.1 mmol/L 4.5  Chloride 101 - 111 mmol/L 97(L)  CO2 22 - 32 mmol/L 23  Calcium 8.9 - 10.3 mg/dL 8.6(L)  Total Protein 6.5 - 8.1 g/dL -  Total Bilirubin 0.3 - 1.2 mg/dL -  Alkaline Phos 38 - 126 U/L -  AST 15 - 41 U/L -  ALT 14 - 54 U/L -   CBC Latest Ref Rng & Units 12/17/2017  WBC 3.6 - 11.0 K/uL 5.5  Hemoglobin 12.0 -  16.0 g/dL 9.5(L)  Hematocrit 35.0 - 47.0 % 29.5(L)  Platelets 150 - 440 K/uL 226     Assessment and plan- Patient is a 79 y.o. female referred for anemia  Patient's most recent hemoglobin is showing significant anemia with a hemoglobin of 7.8.  It is normocytic.  Prior to that in January 2021 her hemoglobin was around 10.  Discussed that we need to do lab labs today to see if there is any reversible cause of anemia such as iron deficiency which can improve her hemoglobin.  Today I will do a complete anemia work-up including CBC with differential, CMP, ferritin and iron studies, B12 and folate, reticulocyte count, B1, B6, haptoglobin, LDH, myeloma panel and serum free light chains and TSH.  I will see her back in 2 weeks time to discuss the results of her blood work.  If peripheral blood work does not reveal an obvious cause of anemia given the significant anemia that she has I will consider doing a bone marrow biopsy  Video visit in 2 weeks time to discuss the results of blood work  Thank you for this kind referral and the opportunity to participate in the care of this patient   Visit Diagnosis 1. Normocytic anemia     Dr. Randa Evens, MD, MPH [facility] [phone] 12/18/2019 12:48 PM

## 2019-12-18 NOTE — Progress Notes (Signed)
Patient here for initial oncology appointment, complains of some shortness of breath upon exertion Otherwise expresses no complaints or concerns at this time.

## 2019-12-19 ENCOUNTER — Encounter: Payer: Self-pay | Admitting: Oncology

## 2019-12-19 LAB — HAPTOGLOBIN: Haptoglobin: 191 mg/dL (ref 42–346)

## 2019-12-20 LAB — ERYTHROPOIETIN: Erythropoietin: 54.3 m[IU]/mL — ABNORMAL HIGH (ref 2.6–18.5)

## 2019-12-21 LAB — MULTIPLE MYELOMA PANEL, SERUM
Albumin SerPl Elph-Mcnc: 4 g/dL (ref 2.9–4.4)
Albumin/Glob SerPl: 1.1 (ref 0.7–1.7)
Alpha 1: 0.3 g/dL (ref 0.0–0.4)
Alpha2 Glob SerPl Elph-Mcnc: 0.9 g/dL (ref 0.4–1.0)
B-Globulin SerPl Elph-Mcnc: 1.2 g/dL (ref 0.7–1.3)
Gamma Glob SerPl Elph-Mcnc: 1.3 g/dL (ref 0.4–1.8)
Globulin, Total: 3.7 g/dL (ref 2.2–3.9)
IgA: 312 mg/dL (ref 64–422)
IgG (Immunoglobin G), Serum: 1228 mg/dL (ref 586–1602)
IgM (Immunoglobulin M), Srm: 91 mg/dL (ref 26–217)
Total Protein ELP: 7.7 g/dL (ref 6.0–8.5)

## 2019-12-23 LAB — VITAMIN B1: Vitamin B1 (Thiamine): 125 nmol/L (ref 66.5–200.0)

## 2019-12-27 LAB — VITAMIN B6: Vitamin B6: 7.2 ug/L (ref 2.0–32.8)

## 2019-12-28 ENCOUNTER — Inpatient Hospital Stay: Payer: Medicare Other

## 2019-12-28 ENCOUNTER — Emergency Department: Payer: Medicare Other

## 2019-12-28 ENCOUNTER — Inpatient Hospital Stay
Admission: EM | Admit: 2019-12-28 | Discharge: 2020-01-03 | DRG: 280 | Disposition: A | Discharge status: Home / Self Care | Payer: Medicare Other | Attending: Internal Medicine | Admitting: Internal Medicine

## 2019-12-28 ENCOUNTER — Encounter
Admission: EM | Disposition: A | Discharge status: Home / Self Care | Payer: Self-pay | Source: Home / Self Care | Attending: Internal Medicine

## 2019-12-28 ENCOUNTER — Other Ambulatory Visit: Payer: Self-pay

## 2019-12-28 DIAGNOSIS — L8990 Pressure ulcer of unspecified site, unspecified stage: Secondary | ICD-10-CM | POA: Diagnosis present

## 2019-12-28 DIAGNOSIS — I959 Hypotension, unspecified: Secondary | ICD-10-CM | POA: Diagnosis not present

## 2019-12-28 DIAGNOSIS — Z833 Family history of diabetes mellitus: Secondary | ICD-10-CM

## 2019-12-28 DIAGNOSIS — K219 Gastro-esophageal reflux disease without esophagitis: Secondary | ICD-10-CM | POA: Diagnosis present

## 2019-12-28 DIAGNOSIS — U071 COVID-19: Secondary | ICD-10-CM | POA: Diagnosis not present

## 2019-12-28 DIAGNOSIS — Z9289 Personal history of other medical treatment: Secondary | ICD-10-CM | POA: Diagnosis not present

## 2019-12-28 DIAGNOSIS — E1165 Type 2 diabetes mellitus with hyperglycemia: Secondary | ICD-10-CM | POA: Diagnosis present

## 2019-12-28 DIAGNOSIS — E785 Hyperlipidemia, unspecified: Secondary | ICD-10-CM | POA: Diagnosis present

## 2019-12-28 DIAGNOSIS — D649 Anemia, unspecified: Secondary | ICD-10-CM | POA: Diagnosis not present

## 2019-12-28 DIAGNOSIS — L4050 Arthropathic psoriasis, unspecified: Secondary | ICD-10-CM | POA: Diagnosis present

## 2019-12-28 DIAGNOSIS — J9601 Acute respiratory failure with hypoxia: Secondary | ICD-10-CM | POA: Diagnosis not present

## 2019-12-28 DIAGNOSIS — I25810 Atherosclerosis of coronary artery bypass graft(s) without angina pectoris: Secondary | ICD-10-CM | POA: Diagnosis present

## 2019-12-28 DIAGNOSIS — M1990 Unspecified osteoarthritis, unspecified site: Secondary | ICD-10-CM | POA: Diagnosis present

## 2019-12-28 DIAGNOSIS — I208 Other forms of angina pectoris: Secondary | ICD-10-CM | POA: Diagnosis present

## 2019-12-28 DIAGNOSIS — I213 ST elevation (STEMI) myocardial infarction of unspecified site: Principal | ICD-10-CM

## 2019-12-28 DIAGNOSIS — Z9841 Cataract extraction status, right eye: Secondary | ICD-10-CM

## 2019-12-28 DIAGNOSIS — Z515 Encounter for palliative care: Secondary | ICD-10-CM

## 2019-12-28 DIAGNOSIS — E1151 Type 2 diabetes mellitus with diabetic peripheral angiopathy without gangrene: Secondary | ICD-10-CM | POA: Diagnosis not present

## 2019-12-28 DIAGNOSIS — I25119 Atherosclerotic heart disease of native coronary artery with unspecified angina pectoris: Secondary | ICD-10-CM | POA: Diagnosis present

## 2019-12-28 DIAGNOSIS — J80 Acute respiratory distress syndrome: Secondary | ICD-10-CM | POA: Diagnosis present

## 2019-12-28 DIAGNOSIS — I110 Hypertensive heart disease with heart failure: Secondary | ICD-10-CM | POA: Diagnosis present

## 2019-12-28 DIAGNOSIS — Z7982 Long term (current) use of aspirin: Secondary | ICD-10-CM

## 2019-12-28 DIAGNOSIS — J9602 Acute respiratory failure with hypercapnia: Secondary | ICD-10-CM | POA: Diagnosis not present

## 2019-12-28 DIAGNOSIS — Z79899 Other long term (current) drug therapy: Secondary | ICD-10-CM

## 2019-12-28 DIAGNOSIS — G40909 Epilepsy, unspecified, not intractable, without status epilepticus: Secondary | ICD-10-CM | POA: Diagnosis present

## 2019-12-28 DIAGNOSIS — I2582 Chronic total occlusion of coronary artery: Secondary | ICD-10-CM | POA: Diagnosis present

## 2019-12-28 DIAGNOSIS — Z794 Long term (current) use of insulin: Secondary | ICD-10-CM

## 2019-12-28 DIAGNOSIS — L8993 Pressure ulcer of unspecified site, stage 3: Secondary | ICD-10-CM | POA: Diagnosis present

## 2019-12-28 DIAGNOSIS — Z955 Presence of coronary angioplasty implant and graft: Secondary | ICD-10-CM

## 2019-12-28 DIAGNOSIS — J1282 Pneumonia due to coronavirus disease 2019: Secondary | ICD-10-CM

## 2019-12-28 DIAGNOSIS — Z881 Allergy status to other antibiotic agents status: Secondary | ICD-10-CM

## 2019-12-28 DIAGNOSIS — Z7189 Other specified counseling: Secondary | ICD-10-CM

## 2019-12-28 DIAGNOSIS — Z8673 Personal history of transient ischemic attack (TIA), and cerebral infarction without residual deficits: Secondary | ICD-10-CM

## 2019-12-28 DIAGNOSIS — Z91048 Other nonmedicinal substance allergy status: Secondary | ICD-10-CM

## 2019-12-28 DIAGNOSIS — Z7902 Long term (current) use of antithrombotics/antiplatelets: Secondary | ICD-10-CM

## 2019-12-28 DIAGNOSIS — Z885 Allergy status to narcotic agent status: Secondary | ICD-10-CM

## 2019-12-28 DIAGNOSIS — I5021 Acute systolic (congestive) heart failure: Secondary | ICD-10-CM | POA: Diagnosis present

## 2019-12-28 DIAGNOSIS — Z8249 Family history of ischemic heart disease and other diseases of the circulatory system: Secondary | ICD-10-CM

## 2019-12-28 DIAGNOSIS — Z90710 Acquired absence of both cervix and uterus: Secondary | ICD-10-CM

## 2019-12-28 DIAGNOSIS — E7800 Pure hypercholesterolemia, unspecified: Secondary | ICD-10-CM | POA: Diagnosis present

## 2019-12-28 DIAGNOSIS — Z9049 Acquired absence of other specified parts of digestive tract: Secondary | ICD-10-CM

## 2019-12-28 DIAGNOSIS — Z20822 Contact with and (suspected) exposure to covid-19: Secondary | ICD-10-CM | POA: Diagnosis present

## 2019-12-28 DIAGNOSIS — Z9842 Cataract extraction status, left eye: Secondary | ICD-10-CM

## 2019-12-28 DIAGNOSIS — R079 Chest pain, unspecified: Secondary | ICD-10-CM | POA: Diagnosis present

## 2019-12-28 DIAGNOSIS — Z803 Family history of malignant neoplasm of breast: Secondary | ICD-10-CM

## 2019-12-28 DIAGNOSIS — Z66 Do not resuscitate: Secondary | ICD-10-CM | POA: Diagnosis present

## 2019-12-28 DIAGNOSIS — I252 Old myocardial infarction: Secondary | ICD-10-CM

## 2019-12-28 DIAGNOSIS — Z823 Family history of stroke: Secondary | ICD-10-CM

## 2019-12-28 DIAGNOSIS — Z961 Presence of intraocular lens: Secondary | ICD-10-CM | POA: Diagnosis present

## 2019-12-28 DIAGNOSIS — J9600 Acute respiratory failure, unspecified whether with hypoxia or hypercapnia: Secondary | ICD-10-CM

## 2019-12-28 DIAGNOSIS — I255 Ischemic cardiomyopathy: Secondary | ICD-10-CM | POA: Diagnosis present

## 2019-12-28 HISTORY — PX: CORONARY/GRAFT ACUTE MI REVASCULARIZATION: CATH118305

## 2019-12-28 HISTORY — PX: LEFT HEART CATH AND CORONARY ANGIOGRAPHY: CATH118249

## 2019-12-28 LAB — BLOOD GAS, ARTERIAL
Acid-base deficit: 2.9 mmol/L — ABNORMAL HIGH (ref 0.0–2.0)
Bicarbonate: 20.9 mmol/L (ref 20.0–28.0)
FIO2: 50
MECHVT: 400 mL
Mechanical Rate: 20
O2 Saturation: 99.8 %
PEEP: 5 cmH2O
Patient temperature: 37
pCO2 arterial: 33 mmHg (ref 32.0–48.0)
pH, Arterial: 7.41 (ref 7.350–7.450)
pO2, Arterial: 221 mmHg — ABNORMAL HIGH (ref 83.0–108.0)

## 2019-12-28 LAB — CBC WITH DIFFERENTIAL/PLATELET
Abs Immature Granulocytes: 0.06 10*3/uL (ref 0.00–0.07)
Basophils Absolute: 0.1 10*3/uL (ref 0.0–0.1)
Basophils Relative: 1 %
Eosinophils Absolute: 0.1 10*3/uL (ref 0.0–0.5)
Eosinophils Relative: 1 %
HCT: 26.5 % — ABNORMAL LOW (ref 36.0–46.0)
Hemoglobin: 8.2 g/dL — ABNORMAL LOW (ref 12.0–15.0)
Immature Granulocytes: 1 %
Lymphocytes Relative: 7 %
Lymphs Abs: 0.9 10*3/uL (ref 0.7–4.0)
MCH: 30.5 pg (ref 26.0–34.0)
MCHC: 30.9 g/dL (ref 30.0–36.0)
MCV: 98.5 fL (ref 80.0–100.0)
Monocytes Absolute: 1 10*3/uL (ref 0.1–1.0)
Monocytes Relative: 8 %
Neutro Abs: 10.1 10*3/uL — ABNORMAL HIGH (ref 1.7–7.7)
Neutrophils Relative %: 82 %
Platelets: 310 10*3/uL (ref 150–400)
RBC: 2.69 MIL/uL — ABNORMAL LOW (ref 3.87–5.11)
RDW: 17.8 % — ABNORMAL HIGH (ref 11.5–15.5)
WBC: 12.1 10*3/uL — ABNORMAL HIGH (ref 4.0–10.5)
nRBC: 0 % (ref 0.0–0.2)

## 2019-12-28 LAB — COMPREHENSIVE METABOLIC PANEL
ALT: 23 U/L (ref 0–44)
AST: 29 U/L (ref 15–41)
Albumin: 4.4 g/dL (ref 3.5–5.0)
Alkaline Phosphatase: 73 U/L (ref 38–126)
Anion gap: 11 (ref 5–15)
BUN: 23 mg/dL (ref 8–23)
CO2: 18 mmol/L — ABNORMAL LOW (ref 22–32)
Calcium: 9.1 mg/dL (ref 8.9–10.3)
Chloride: 105 mmol/L (ref 98–111)
Creatinine, Ser: 0.91 mg/dL (ref 0.44–1.00)
GFR calc Af Amer: >60 mL/min (ref >60)
GFR calc non Af Amer: >60 mL/min (ref >60)
Glucose, Bld: 256 mg/dL — ABNORMAL HIGH (ref 70–99)
Potassium: 5.1 mmol/L (ref 3.5–5.1)
Sodium: 134 mmol/L — ABNORMAL LOW (ref 135–145)
Total Bilirubin: 0.9 mg/dL (ref 0.3–1.2)
Total Protein: 7.9 g/dL (ref 6.5–8.1)

## 2019-12-28 LAB — RESPIRATORY PANEL BY RT PCR (FLU A&B, COVID)
Influenza A by PCR: NEGATIVE
Influenza B by PCR: NEGATIVE
SARS Coronavirus 2 by RT PCR: POSITIVE — AB

## 2019-12-28 LAB — LIPID PANEL
Cholesterol: 137 mg/dL (ref 0–200)
HDL: 44 mg/dL (ref >40)
LDL Cholesterol: 68 mg/dL (ref 0–99)
Total CHOL/HDL Ratio: 3.1 RATIO
Triglycerides: 127 mg/dL (ref <150)
VLDL: 25 mg/dL (ref 0–40)

## 2019-12-28 LAB — FIBRIN DERIVATIVES D-DIMER (ARMC ONLY): Fibrin derivatives D-dimer (ARMC): 1467.69 ng/mL (FEU) — ABNORMAL HIGH (ref 0.00–499.00)

## 2019-12-28 LAB — HEMOGLOBIN A1C
Hgb A1c MFr Bld: 6.1 % — ABNORMAL HIGH (ref 4.8–5.6)
Mean Plasma Glucose: 128.37 mg/dL

## 2019-12-28 LAB — PROTIME-INR
INR: 1.2 (ref 0.8–1.2)
Prothrombin Time: 14.7 seconds (ref 11.4–15.2)

## 2019-12-28 LAB — TROPONIN I (HIGH SENSITIVITY)
Troponin I (High Sensitivity): 13 ng/L (ref <18)
Troponin I (High Sensitivity): 587 ng/L (ref <18)

## 2019-12-28 LAB — URINALYSIS, ROUTINE W REFLEX MICROSCOPIC
Bilirubin Urine: NEGATIVE
Glucose, UA: 50 mg/dL — AB
Hgb urine dipstick: NEGATIVE
Ketones, ur: NEGATIVE mg/dL
Leukocytes,Ua: NEGATIVE
Nitrite: NEGATIVE
Protein, ur: NEGATIVE mg/dL
Specific Gravity, Urine: 1.014 (ref 1.005–1.030)
pH: 6 (ref 5.0–8.0)

## 2019-12-28 LAB — APTT: aPTT: >160 seconds (ref 24–36)

## 2019-12-28 LAB — MRSA PCR SCREENING: MRSA by PCR: NEGATIVE

## 2019-12-28 LAB — TRIGLYCERIDES: Triglycerides: 72 mg/dL (ref <150)

## 2019-12-28 LAB — GLUCOSE, CAPILLARY: Glucose-Capillary: 213 mg/dL — ABNORMAL HIGH (ref 70–99)

## 2019-12-28 LAB — FERRITIN: Ferritin: 17 ng/mL (ref 11–307)

## 2019-12-28 LAB — FIBRINOGEN: Fibrinogen: 415 mg/dL (ref 210–475)

## 2019-12-28 SURGERY — CORONARY/GRAFT ACUTE MI REVASCULARIZATION
Anesthesia: Moderate Sedation

## 2019-12-28 MED ORDER — SODIUM CHLORIDE 0.9 % IV SOLN
250.0000 mL | INTRAVENOUS | Status: DC | PRN
  Administered 2020-01-03: 250 mL via INTRAVENOUS

## 2019-12-28 MED ORDER — HEPARIN SODIUM (PORCINE) 5000 UNIT/ML IJ SOLN
60.0000 [IU]/kg | Freq: Once | INTRAMUSCULAR | Status: AC
  Administered 2019-12-28: 3300 [IU] via INTRAVENOUS
  Filled 2019-12-28: qty 1

## 2019-12-28 MED ORDER — FUROSEMIDE 10 MG/ML IJ SOLN
INTRAMUSCULAR | Status: AC
  Filled 2019-12-28: qty 8

## 2019-12-28 MED ORDER — FUROSEMIDE 10 MG/ML IJ SOLN
INTRAMUSCULAR | Status: DC | PRN
  Administered 2019-12-28: 80 mg via INTRAVENOUS

## 2019-12-28 MED ORDER — TICAGRELOR 90 MG PO TABS
ORAL_TABLET | ORAL | Status: AC
  Filled 2019-12-28: qty 2

## 2019-12-28 MED ORDER — PROPOFOL 1000 MG/100ML IV EMUL
5.0000 ug/kg/min | INTRAVENOUS | Status: DC
  Administered 2019-12-28: 5 ug/kg/min via INTRAVENOUS
  Administered 2019-12-29 (×2): 40 ug/kg/min via INTRAVENOUS
  Filled 2019-12-28 (×3): qty 100

## 2019-12-28 MED ORDER — ZINC SULFATE 220 (50 ZN) MG PO CAPS
220.0000 mg | ORAL_CAPSULE | Freq: Every day | ORAL | Status: DC
  Administered 2019-12-29 – 2019-12-30 (×2): 220 mg
  Filled 2019-12-28 (×3): qty 1

## 2019-12-28 MED ORDER — ETOMIDATE 2 MG/ML IV SOLN
INTRAVENOUS | Status: DC | PRN
  Administered 2019-12-28: 20 mg via INTRAVENOUS

## 2019-12-28 MED ORDER — MIDAZOLAM HCL 2 MG/2ML IJ SOLN
INTRAMUSCULAR | Status: AC
  Filled 2019-12-28: qty 2

## 2019-12-28 MED ORDER — HEPARIN (PORCINE) IN NACL 1000-0.9 UT/500ML-% IV SOLN
INTRAVENOUS | Status: DC | PRN
  Administered 2019-12-28 (×2): 500 mL

## 2019-12-28 MED ORDER — ASPIRIN 81 MG PO CHEW
324.0000 mg | CHEWABLE_TABLET | Freq: Once | ORAL | Status: DC
  Filled 2019-12-28: qty 4

## 2019-12-28 MED ORDER — FAMOTIDINE IN NACL 20-0.9 MG/50ML-% IV SOLN
20.0000 mg | Freq: Two times a day (BID) | INTRAVENOUS | Status: DC
  Administered 2019-12-28 – 2020-01-02 (×10): 20 mg via INTRAVENOUS
  Filled 2019-12-28 (×11): qty 50

## 2019-12-28 MED ORDER — NITROGLYCERIN 1 MG/10 ML FOR IR/CATH LAB
INTRA_ARTERIAL | Status: AC
  Filled 2019-12-28: qty 10

## 2019-12-28 MED ORDER — FENTANYL CITRATE (PF) 100 MCG/2ML IJ SOLN: 25.0000 ug | INTRAMUSCULAR | Status: DC | PRN

## 2019-12-28 MED ORDER — SODIUM CHLORIDE 0.9 % IV SOLN
100.0000 mg | Freq: Every day | INTRAVENOUS | Status: AC
  Administered 2019-12-29 – 2020-01-01 (×4): 100 mg via INTRAVENOUS
  Filled 2019-12-28 (×4): qty 100

## 2019-12-28 MED ORDER — LIDOCAINE HCL (CARDIAC) PF 100 MG/5ML IV SOSY
PREFILLED_SYRINGE | INTRAVENOUS | Status: AC
  Filled 2019-12-28: qty 5

## 2019-12-28 MED ORDER — POLYETHYLENE GLYCOL 3350 17 G PO PACK
17.0000 g | PACK | Freq: Every day | ORAL | Status: DC
  Administered 2019-12-29 – 2020-01-03 (×2): 17 g via ORAL
  Filled 2019-12-28 (×6): qty 1

## 2019-12-28 MED ORDER — FENTANYL CITRATE (PF) 100 MCG/2ML IJ SOLN
INTRAMUSCULAR | Status: AC
  Filled 2019-12-28: qty 2

## 2019-12-28 MED ORDER — ONDANSETRON HCL 4 MG/2ML IJ SOLN: 4.0000 mg | Freq: Four times a day (QID) | INTRAMUSCULAR | Status: DC | PRN

## 2019-12-28 MED ORDER — PROPOFOL 1000 MG/100ML IV EMUL
INTRAVENOUS | Status: AC
  Filled 2019-12-28: qty 100

## 2019-12-28 MED ORDER — ETOMIDATE 2 MG/ML IV SOLN
INTRAVENOUS | Status: AC
  Filled 2019-12-28: qty 20

## 2019-12-28 MED ORDER — HEPARIN (PORCINE) 25000 UT/250ML-% IV SOLN
750.0000 [IU]/h | INTRAVENOUS | Status: DC
  Administered 2019-12-28: 650 [IU]/h via INTRAVENOUS
  Administered 2019-12-31: 750 [IU]/h via INTRAVENOUS
  Filled 2019-12-28 (×4): qty 250

## 2019-12-28 MED ORDER — METHYLPREDNISOLONE SODIUM SUCC 40 MG IJ SOLR
40.0000 mg | Freq: Two times a day (BID) | INTRAMUSCULAR | Status: DC
  Administered 2019-12-28 – 2019-12-29 (×3): 40 mg via INTRAVENOUS
  Filled 2019-12-28 (×3): qty 1

## 2019-12-28 MED ORDER — FENTANYL CITRATE (PF) 100 MCG/2ML IJ SOLN
INTRAMUSCULAR | Status: DC | PRN
  Administered 2019-12-28: 25 ug via INTRAVENOUS

## 2019-12-28 MED ORDER — LABETALOL HCL 5 MG/ML IV SOLN: 10.0000 mg | INTRAVENOUS | Status: AC | PRN

## 2019-12-28 MED ORDER — ASPIRIN 81 MG PO CHEW
81.0000 mg | CHEWABLE_TABLET | Freq: Every day | ORAL | Status: DC
  Administered 2019-12-29 – 2020-01-03 (×6): 81 mg via ORAL
  Filled 2019-12-28 (×6): qty 1

## 2019-12-28 MED ORDER — INSULIN ASPART 100 UNIT/ML ~~LOC~~ SOLN
0.0000 [IU] | SUBCUTANEOUS | Status: DC
  Administered 2019-12-28: 7 [IU] via SUBCUTANEOUS
  Administered 2019-12-29 (×3): 3 [IU] via SUBCUTANEOUS
  Administered 2019-12-29: 4 [IU] via SUBCUTANEOUS
  Administered 2019-12-30: 11 [IU] via SUBCUTANEOUS
  Administered 2019-12-30: 4 [IU] via SUBCUTANEOUS
  Administered 2019-12-30 (×2): 11 [IU] via SUBCUTANEOUS
  Administered 2019-12-31: 7 [IU] via SUBCUTANEOUS
  Administered 2019-12-31: 15 [IU] via SUBCUTANEOUS
  Administered 2019-12-31 – 2020-01-02 (×7): 7 [IU] via SUBCUTANEOUS
  Administered 2020-01-02: 21:00:00 4 [IU] via SUBCUTANEOUS
  Administered 2020-01-02: 3 [IU] via SUBCUTANEOUS
  Administered 2020-01-02: 7 [IU] via SUBCUTANEOUS
  Administered 2020-01-03: 3 [IU] via SUBCUTANEOUS
  Administered 2020-01-03: 15 [IU] via SUBCUTANEOUS
  Filled 2019-12-28 (×26): qty 1

## 2019-12-28 MED ORDER — IPRATROPIUM-ALBUTEROL 0.5-2.5 (3) MG/3ML IN SOLN: 3.0000 mL | RESPIRATORY_TRACT | Status: DC | PRN

## 2019-12-28 MED ORDER — SUCCINYLCHOLINE CHLORIDE 20 MG/ML IJ SOLN
INTRAMUSCULAR | Status: DC | PRN
  Administered 2019-12-28: 100 mg via INTRAVENOUS

## 2019-12-28 MED ORDER — BIVALIRUDIN TRIFLUOROACETATE 250 MG IV SOLR
INTRAVENOUS | Status: AC
  Filled 2019-12-28: qty 250

## 2019-12-28 MED ORDER — SODIUM CHLORIDE 0.9 % IV SOLN
200.0000 mg | Freq: Once | INTRAVENOUS | Status: AC
  Administered 2019-12-28: 200 mg via INTRAVENOUS
  Filled 2019-12-28: qty 40

## 2019-12-28 MED ORDER — ASCORBIC ACID 500 MG PO TABS
500.0000 mg | ORAL_TABLET | Freq: Every day | ORAL | Status: DC
  Administered 2019-12-29 – 2019-12-30 (×2): 500 mg
  Filled 2019-12-28 (×3): qty 1

## 2019-12-28 MED ORDER — HEPARIN (PORCINE) IN NACL 1000-0.9 UT/500ML-% IV SOLN
INTRAVENOUS | Status: AC
  Filled 2019-12-28: qty 1000

## 2019-12-28 MED ORDER — SODIUM CHLORIDE 0.9% FLUSH
3.0000 mL | Freq: Two times a day (BID) | INTRAVENOUS | Status: DC
  Administered 2019-12-29 – 2020-01-03 (×7): 3 mL via INTRAVENOUS

## 2019-12-28 MED ORDER — SODIUM CHLORIDE 0.9% FLUSH: 3.0000 mL | INTRAVENOUS | Status: DC | PRN

## 2019-12-28 MED ORDER — DOCUSATE SODIUM 50 MG/5ML PO LIQD
100.0000 mg | Freq: Two times a day (BID) | ORAL | Status: DC
  Administered 2019-12-28 – 2020-01-03 (×7): 100 mg via ORAL
  Filled 2019-12-28 (×12): qty 10

## 2019-12-28 MED ORDER — MIDAZOLAM HCL 2 MG/2ML IJ SOLN
INTRAMUSCULAR | Status: DC | PRN
  Administered 2019-12-28 (×3): 1 mg via INTRAVENOUS

## 2019-12-28 MED ORDER — SODIUM CHLORIDE 0.9 % IV SOLN: INTRAVENOUS | Status: DC

## 2019-12-28 MED ORDER — ACETAMINOPHEN 325 MG PO TABS: 650.0000 mg | ORAL_TABLET | ORAL | Status: DC | PRN

## 2019-12-28 MED ORDER — IOHEXOL 300 MG/ML  SOLN
INTRAMUSCULAR | Status: DC | PRN
  Administered 2019-12-28: 170 mL

## 2019-12-28 MED ORDER — HEPARIN (PORCINE) 25000 UT/250ML-% IV SOLN: 650.0000 [IU]/h | INTRAVENOUS | Status: DC

## 2019-12-28 MED ORDER — HYDRALAZINE HCL 20 MG/ML IJ SOLN: 10.0000 mg | INTRAMUSCULAR | Status: AC | PRN

## 2019-12-28 MED ORDER — CLOPIDOGREL BISULFATE 75 MG PO TABS
75.0000 mg | ORAL_TABLET | Freq: Every day | ORAL | Status: DC
  Administered 2019-12-29: 75 mg via ORAL
  Filled 2019-12-28: qty 1

## 2019-12-28 SURGICAL SUPPLY — 14 items
CATH INFINITI 5 FR MPA2 (CATHETERS) ×3 IMPLANT
CATH INFINITI 5FR ANG PIGTAIL (CATHETERS) ×3 IMPLANT
CATH INFINITI 5FR JL4 (CATHETERS) ×6 IMPLANT
CATH INFINITI JR4 5F (CATHETERS) ×6 IMPLANT
DEVICE CLOSURE MYNXGRIP 6/7F (Vascular Products) ×3 IMPLANT
DEVICE INFLAT 30 PLUS (MISCELLANEOUS) ×3 IMPLANT
DEVICE SAFEGUARD 24CM (GAUZE/BANDAGES/DRESSINGS) ×3 IMPLANT
KIT MANI 3VAL PERCEP (MISCELLANEOUS) ×3 IMPLANT
NEEDLE PERC 18GX7CM (NEEDLE) ×3 IMPLANT
OXISENSOR HEAD OXIMETRY MAXFAS (MISCELLANEOUS) ×3 IMPLANT
PACK CARDIAC CATH (CUSTOM PROCEDURE TRAY) ×3 IMPLANT
SHEATH AVANTI 6FR X 11CM (SHEATH) ×3 IMPLANT
WIRE GUIDERIGHT .035X150 (WIRE) ×3 IMPLANT
WIRE HITORQ VERSACORE ST 145CM (WIRE) ×3 IMPLANT

## 2019-12-28 NOTE — ED Triage Notes (Addendum)
Pt from home via [facility]. Per EMS, central chest pain radiated to left arm at 1130am. EMS given en-route nitro patch w/o relief and 324mg  ASA. EDP [place] at bedside upon arrival.

## 2019-12-28 NOTE — ED Notes (Addendum)
Pt taken to cath lab

## 2019-12-28 NOTE — ED Notes (Signed)
Cardiologist at bedside. MD Mayford Knife at bedside.

## 2019-12-28 NOTE — ED Provider Notes (Signed)
Department of Emergency Medicine   Code Blue CONSULT NOTE  Chief Complaint: Unresponsive, not breathing  Level V Caveat: Unresponsive  History of present illness: I was contacted by the hospital requesting intubation in the Cath Lab for a known STEMI patient.  Patient had been seen in the ER prior by me and was taken to the Cath Lab with subsequent deterioration.  ROS: Unable to obtain, Level V caveat  Scheduled Meds: . [MAR Hold] aspirin  324 mg Oral Once   Continuous Infusions: . sodium chloride    . heparin     PRN Meds:. Past Medical History:  Diagnosis Date  . Acid reflux   . Arthritis    "all over"  . Chronic stable angina (HCC)   . Coronary artery disease   . Hypercholesterolemia   . Hypertension   . Myocardial infarction (HCC) 1991; 07/06/2015  . NSTEMI (non-ST elevated myocardial infarction) (H[facility]) 10/22/2015  . Psoriatic arthritis (HCC)   . 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 TIA (transient ischemic attack)    "several over the last 20 years" (10/22/2015)  . Type II diabetes mellitus (HCC)    Past Surgical History:  Procedure Laterality Date  . ABDOMINAL HYSTERECTOMY  1973  . APPENDECTOMY  1950s   elementary school  . CARDIAC CATHETERIZATION  X 2-3  . CARDIAC CATHETERIZATION N/A 11/21/2015   Procedure: Left Heart Cath and Coronary Angiography;  Surgeon: Corky Crafts, MD;  [facility] INVAS[facility];  Service: Cardiovascular;  Laterality: N/A;  . CARDIAC CATHETERIZATION  11/21/2015   Procedure: Coronary Stent Intervention;  Surgeon: Corky Crafts, MD;  Location: [facility] INVAS[facility] LAB;  Service: Cardiovascular;;  . CARDIAC CATHETERIZATION N/A 01/29/2016   Procedure: Left Heart Cath and Cors/Grafts Angiography;  Surgeon: Tonny Bollman, MD;  Location: [facility] INVASIVE CV LAB;  Service: Cardiovascular;  Laterality: N/A;  . CARDIAC CATHETERIZATION N/A 07/22/2016   Procedure: Left Heart Cath and  Coronary Angiography;  Surgeon: Iran Ouch, MD;  Location: [facility] INVASIVE CV LAB;  Service: Cardiovascular;  Laterality: N/A;  . CAROTID STENT INSERTION Bilateral 2011-2012   right-left  . CATARACT EXTRACTION W/ INTRAOCULAR LENS  IMPLANT, BILATERAL Bilateral 2009-2010  . CHOLECYSTECTOMY OPEN  ~ 2014  . CORONARY ANGIOPLASTY    . CORONARY ANGIOPLASTY WITH STENT PLACEMENT  11/21/2015  . CORONARY ARTERY BYPASS GRAFT  08/1995  . DILATION AND CURETTAGE OF UTERUS  X 1  . 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 TONSILLECTOMY  ~ 1947   2nd grade   Social History   Socioeconomic History  . Marital status: Widowed    Spouse name: Not on file  . Number of children: Not on file  . Years of education: Not on file  . Highest education level: Not on file  Occupational History  . Not on file  Tobacco Use  . Smoking status: Never Smoker  . Smokeless tobacco: Never Used  Substance and Sexual Activity  . Alcohol use: No  . Drug use: No  . Sexual activity: Never  Other Topics Concern  . Not on file  Social History Narrative   Lives at home with one of her sons. Independent at baseline.   Social Determinants of Health   Financial Resource Strain:   . Difficulty of Paying Living Expenses:   Food Insecurity:   . Worried About Programme researcher, broadcasting/film/video in the Last  Year:   . Ran Out of Food in the Last Year:   Transportation Needs:   . Film/video editor (Medical):   [name] Lack of Transportation (Non-Medical):   Physical Activity:   . Days of Exercise per Week:   . Minutes of Exercise per Session:   Stress:   . Feeling of Stress :   Social Connections:   . Frequency of Communication with Friends and Family:   . Frequency of Social Gatherings with Friends and Family:   . Attends Religious Services:   . Active Member of Clubs or Organizations:   . Attends Archivist Meetings:   [name] Marital Status:   Intimate Partner Violence:   .  Fear of Current or Ex-Partner:   . Emotionally Abused:   [name] Physically Abused:   . Sexually Abused:    Allergies  Allergen Reactions  . Talwin [Pentazocine] Anaphylaxis and Swelling    Throat swelling  . Valium [Diazepam] Other (See Comments)    Makes her feel like she's flying  . Vicodin [Hydrocodone-Acetaminophen] Nausea And Vomiting  . Adhesive [Tape] Other (See Comments)    Bruising and TEARING!!!  Please use "paper" tape  . Beta Adrenergic Blockers Other (See Comments)    Pt states she not allergic  . Levofloxacin Swelling and Other (See Comments)    Tongue swells   . Simvastatin Swelling and Rash    Mouth swelling    Last set of Vital Signs (not current) Vitals:   12/28/19 1558 12/28/19 1617  BP: (!) 176/101   Pulse: (!) 105   Resp: 20   SpO2: 98% 99%      Physical Exam  Gen: unresponsive Cardiovascular: Rapid rate, regular rhythm Resp: apneic.  Rales, right greater than left Abd: nondistended  Neuro: Patient does have some gag reflex, is unresponsive and sedated at the time my evaluation HEENT: No blood in posterior pharynx, mild gag reflex Neck: No crepitus  Musculoskeletal: No deformity  Skin: warm, pallor is noted  Procedures  INTUBATION Performed by: Laurence Aly Required items: required blood products, implants, devices, and special equipment available Patient identity confirmed: provided demographic data and hospital-assigned identification number Time out: Immediately prior to procedure a "time out" was called to verify the correct patient, procedure, equipment, support staff and site/side marked as required. Indications: Definitive airway, respiratory arrest Intubation method: Rapid sequence intubation Preoxygenation: 100% BVM Sedatives: 20 mg of etomidate Paralytic: 100 mg of succinylcholine Tube Size: 7.5 cuffed Post-procedure assessment: chest rise and ETCO2 monitor Breath sounds: equal and absent over the epigastrium Tube secured by  Respiratory Therapy Patient tolerated the procedure well with no immediate complications.  Medical Decision making  Patient was in the Cath Lab for recent STEMI, apparent respiratory arrest with hypoxia while I was in the room.  Decision was made for rapid sequence intubation.  Assessment and Plan  ST elevation MI, flash pulmonary edema Patient was intubated in the Cath Lab and apparent flash pulmonary edema.  We had difficulty maintaining oxygen saturations prior to intubation on 100% bag-valve-mask.  She was intubated without difficulty and care was transferred back to the cardiologist.   Earleen Newport, MD 12/28/19 [phone]

## 2019-12-28 NOTE — ED Provider Notes (Signed)
[facility] Emergency Department Provider Note       Time seen: ----------------------------------------- 3:59 PM on 12/28/2019 -----------------------------------------   I have reviewed the triage vital signs and the nursing notes.  HISTORY   Chief Complaint Code STEMI    HPI Leah Small is a 79 y.o. female with a history of GERD, arthritis, chronic stable angina, coronary disease, hyperlipidemia, hypertension, MI, seizure, TIA who presents to the ED for central chest pain that radiated to the left arm beginning at 11:30 AM.  EMS gave nitro patch without relief and 324 mg of aspirin.  Patient with 10 out of 10 chest pain on arrival.  Has reportedly had some confusion.  Past Medical History:  Diagnosis Date  . Acid reflux   . Arthritis    "all over"  . Chronic stable angina ([place])   . Coronary artery disease   . Hypercholesterolemia   . Hypertension   . Myocardial infarction ([place]) 1991; 07/06/2015  . NSTEMI (non-ST elevated myocardial infarction) ([place]) 10/22/2015  . Psoriatic arthritis ([place])   . Seizures ([place])    "complex partial; 1st one was 07/06/2015; might have had one today" (10/22/2015)  . TIA (transient ischemic attack)    "several over the last 20 years" (10/22/2015)  . Type II diabetes mellitus [facility])     Patient Active Problem List   Diagnosis Date Noted  . Malnutrition of moderate degree 08/12/2017  . Hypotension   . Carotid stenosis 11/12/2016  . Hypertensive heart disease 01/29/2016  . Normocytic anemia 01/29/2016  . History of vaginal bleeding 01/29/2016  . GERD (gastroesophageal reflux disease) 01/29/2016  . NSTEMI (non-ST elevated myocardial infarction) ([place]) 10/22/2015  . Non-STEMI (non-ST elevated myocardial infarction) ([place]) 10/22/2015  . 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 Coronary atherosclerosis of native coronary artery 10/22/2015  . Angina pectoris ([place])   . Memory deficit   . Stroke ([place])  07/06/2015  . Diabetes mellitus with complication ([place]) 40/98/1191  . Essential hypertension 07/06/2015  . Hyperlipidemia 07/06/2015  . Left-sided weakness     Past Surgical History:  Procedure Laterality Date  . ABDOMINAL HYSTERECTOMY  1973  . APPENDECTOMY  1950s   elementary school  . CARDIAC CATHETERIZATION  X 2-3  . CARDIAC CATHETERIZATION N/A 11/21/2015   Procedure: Left Heart Cath and Coronary Angiography;  Surgeon: Jettie Booze, MD;  Location: [place] CV LAB;  Service: Cardiovascular;  Laterality: N/A;  . CARDIAC CATHETERIZATION  11/21/2015   Procedure: Coronary Stent Intervention;  Surgeon: Jettie Booze, MD;  Location: [place] CV LAB;  Service: Cardiovascular;;  . CARDIAC CATHETERIZATION N/A 01/29/2016   Procedure: Left Heart Cath and Cors/Grafts Angiography;  Surgeon: Sherren Mocha, MD;  Location: [place] CV LAB;  Service: Cardiovascular;  Laterality: N/A;  . CARDIAC CATHETERIZATION N/A 07/22/2016   Procedure: Left Heart Cath and Coronary Angiography;  Surgeon: Wellington Hampshire, MD;  Location: [place] CV LAB;  Service: Cardiovascular;  Laterality: N/A;  . CAROTID STENT INSERTION Bilateral 2011-2012   right-left  . CATARACT EXTRACTION W/ INTRAOCULAR LENS  IMPLANT, BILATERAL Bilateral 2009-2010  . CHOLECYSTECTOMY OPEN  ~ 2014  . CORONARY ANGIOPLASTY    . CORONARY ANGIOPLASTY WITH STENT PLACEMENT  11/21/2015  . CORONARY ARTERY BYPASS GRAFT  08/1995  . DILATION AND CURETTAGE OF UTERUS  X 1  . 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 TONSILLECTOMY  ~ 1947   2nd grade    Allergies  Talwin [pentazocine], Valium [diazepam], Vicodin [hydrocodone-acetaminophen], Adhesive [tape], Beta adrenergic blockers, Levofloxacin, and Simvastatin  Social History Social History   Tobacco Use  . Smoking status: Never Smoker  . Smokeless tobacco: Never Used  Substance Use Topics  . Alcohol use: No  . Drug use: No     Review of Systems Constitutional: Negative for fever. Cardiovascular: Positive for chest pain Respiratory: Positive for shortness of breath Gastrointestinal: Negative for abdominal pain, vomiting and diarrhea. Musculoskeletal: Negative for back pain. Skin: Positive for diaphoresis Neurological: Negative for headaches, focal weakness or numbness.  All systems negative/normal/unremarkable except as stated in the HPI  ____________________________________________   PHYSICAL EXAM:  VITAL SIGNS: ED Triage Vitals  Enc Vitals Group     BP      Pulse      Resp      Temp      Temp src      SpO2      Weight      Height      Head Circumference      Peak Flow      Pain Score      Pain Loc      Pain Edu?      Excl. in GC?     Constitutional: Alert and oriented.  Mild to moderate distress Eyes: Conjunctivae are normal. Normal extraocular movements. Cardiovascular: Normal rate, regular rhythm. No murmurs, rubs, or gallops. Respiratory: Normal respiratory effort without tachypnea nor retractions. Breath sounds are clear and equal bilaterally. No wheezes/rales/rhonchi. Gastrointestinal: Soft and nontender. Normal bowel sounds Musculoskeletal: Nontender with normal range of motion in extremities. No lower extremity tenderness nor edema. Neurologic:  Normal speech and language. No gross focal neurologic deficits are appreciated.  Skin:  Skin is warm, dry and intact.  Pallor is noted Psychiatric: Mood and affect are normal. Speech and behavior are normal.  ____________________________________________  EKG: Interpreted by me.  Sinus tachycardia with a rate of 107 bpm, possible septal infarct, nonspecific ST segment changes, ischemic ST depressions are noted, normal axis, normal QT  ____________________________________________  ED COURSE:  As part of my medical decision making, I reviewed the following data within the electronic MEDICAL RECORD NUMBER History obtained from family if  available, nursing notes, old chart and ekg, as well as notes from prior ED visits. Patient presented for chest pain, we will assess with labs and imaging as indicated at this time.  Code STEMI has been activated   Procedures  Leah Small was evaluated in Emergency Department on 12/28/2019 for the symptoms described in the history of present illness. She was evaluated in the context of the global COVID-19 pandemic, which necessitated consideration that the patient might be at risk for infection with the SARS-CoV-2 virus that causes COVID-19. Institutional protocols and algorithms that pertain to the evaluation of patients at risk for COVID-19 are in a state of rapid change based on information released by regulatory bodies including the CDC and federal and state organizations. These policies and algorithms were followed during the patient's care in the ED.  ____________________________________________   LABS (pertinent positives/negatives)  Labs Reviewed  RESPIRATORY PANEL BY RT PCR (FLU A&B, COVID)  HEMOGLOBIN A1C  CBC WITH DIFFERENTIAL/PLATELET  PROTIME-INR  APTT  COMPREHENSIVE METABOLIC PANEL  LIPID PANEL  TROPONIN I (HIGH SENSITIVITY)   CRITICAL CARE Performed by: Ulice Dash   Total critical care time: 15 minutes  Critical care time was exclusive of separately billable procedures and treating other patients.  Critical care was  necessary to treat or prevent imminent or life-threatening deterioration.  Critical care was time spent personally by me on the following activities: development of treatment plan with patient and/or surrogate as well as nursing, discussions with consultants, evaluation of patient's response to treatment, examination of patient, obtaining history from patient or surrogate, ordering and performing treatments and interventions, ordering and review of laboratory studies, ordering and review of radiographic studies, pulse oximetry and re-evaluation of  patient's condition.  RADIOLOGY  Chest x-ray Is pending at this time ____________________________________________   DIFFERENTIAL DIAGNOSIS   Unstable angina, MI, STEMI, dissection, PE  FINAL ASSESSMENT AND PLAN  Chest pain, ST elevation MI   Plan: The patient had presented for chest pain with symptoms resembling her prior heart attack.  Prehospital EKG was borderline for ST elevation, with her 10 out of 10 pain code STEMI has been activated.  And imaging are pending.  We have ordered heparin for her.  This seems to indicate an acute coronary event, possible STEMI.  Cardiology has been in the room and has evaluated her and will take her to the Cath Lab.   Ulice Dash, MD    Note: This note was generated in part or whole with voice recognition software. Voice recognition is usually quite accurate but there are transcription errors that can and very often do occur. I apologize for any typographical errors that were not detected and corrected.     Emily Filbert, MD 12/28/19 [id number]

## 2019-12-28 NOTE — Consult Note (Signed)
ANTICOAGULATION CONSULT NOTE - Follow Up Consult  Pharmacy Consult for Heparin  Indication: ACS / STEMI  Allergies  Allergen Reactions  . Talwin [Pentazocine] Anaphylaxis and Swelling    Throat swelling  . Valium [Diazepam] Other (See Comments)    Makes her feel like she's flying  . Vicodin [Hydrocodone-Acetaminophen] Nausea And Vomiting  . Adhesive [Tape] Other (See Comments)    Bruising and TEARING!!!  Please use "paper" tape  . Beta Adrenergic Blockers Other (See Comments)    Pt states she not allergic  . Levofloxacin Swelling and Other (See Comments)    Tongue swells   . Simvastatin Swelling and Rash    Mouth swelling    Patient Measurements:   Heparin Dosing Weight: 55.3 kg   Vital Signs:    Labs: Recent Labs    12/28/19 1601  HGB 8.2*  HCT 26.5*  PLT 310    CrCl cannot be calculated (Unknown ideal weight.).   Medications:  Per chart review, no prior anticoagulants at home.   Assessment: Pharmacy has been consulted for heparin dosing in patient who presented with chest pain that radiated to her left arm. Baseline aPTT, INR, and CBC have been ordered. Patient is in the cath lab at this time.    Goal of Therapy:  Heparin level 0.3-0.7 units/ml Monitor platelets by anticoagulation protocol: Yes   Plan:  Baseline labs have been ordered  Heparin DW: 55.3 kg  Give 3300 units bolus x 1 Start heparin infusion at 650 units/hr Check anti-Xa level in 8 hours and daily while on heparin, per protocol Continue to monitor H&H and platelets   Mozell Hardacre R Angelly Spearing 12/28/2019,4:20 PM

## 2019-12-28 NOTE — Consult Note (Signed)
CARDIOLOGY CONSULT NOTE               Patient ID: Leah Small MRN: [id number] DOB/AGE: May 24, 1941 79 y.o.  Admit date: 12/28/2019 Referring Physician Dr. Daryel November, ER Primary Physician Dr. Aletha Halim primary Primary Cardiologist Dr. Arnoldo Hooker Reason for Consultation STEMI  HPI: 79 year old female known coronary disease history of coronary bypass surgery history of non-STEMI last cath was 2017 by Dr. Kirke Corin  patient has chronic angina on Ranexa and nitrates diabetes hypertension hyperlipidemia seizure disorder.  Patient had acute onset of substernal chest discomfort 10 out of 10 she finally called rescue and was brought to the emergency room she taken nitroglycerin sublingual at home without significant improvement.  Patient also complained of shortness of breath not able to lie flat.  When the patient was brought in by rescue she had ST elevation on EKG no absolute no absolute change from previous EKG reviewed from 2019.  Patient has chronic diffuse changes of ST elevation and ST depression throughout but was different the patient had substernal chest crushing chest pain 10 out of 10 with shortness of breath and dyspnea.  Patient was brought to the Cath Lab with intention to treat Covid study was sent and was found to be positive patient had not been vaccinated according to her son.  During the course of cardiac cath procedure when access has been obtained the patient had respiratory failure and became apneic never lost a pulse of blood pressure but quit breathing requiring bagging and and subsequent intubation by the ER physician.  Cardiac cath was performed and was not very different from 2017 depressed left ventricular function around 35% mainly inferiorly but globally as well left ventricular enlargement.  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  Total occlusion of vein graft to RCA circumflex system and diagonal.   The only functioning graft was the LIMA to the mid LAD which appeared to be relatively normal.  Distal runoff of her lower vessels appeared to show no significant disease.  Because of respiratory failure no significant change in EKG or cath images the patient was then transferred to ICU under critical care team because of respiratory failure and Covid positive status Case was discussed with her son  Review of systems complete and found to be negative unless listed above     Past Medical History:  Diagnosis Date  . Acid reflux   . Arthritis    "all over"  . Chronic stable angina ([facility])   . Coronary artery disease   . Hypercholesterolemia   . Hypertension   . Myocardial infarction ([facility]) 1991; 07/06/2015  . NSTEMI (non-ST elevated myocardial infarction) ([facility]) 10/22/2015  . Psoriatic arthritis ([facility])   . 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 TIA (transient ischemic attack)    "several over the last 20 years" (10/22/2015)  . Type II diabetes mellitus ([facility])     Past Surgical History:  Procedure Laterality Date  . ABDOMINAL HYSTERECTOMY  1973  . APPENDECTOMY  1950s   elementary school  . CARDIAC CATHETERIZATION  X 2-3  . CARDIAC CATHETERIZATION N/A 11/21/2015   Procedure: Left Heart Cath and Coronary Angiography;  Surgeon: Corky Crafts, MD;  Location: [facility] INVASIVE [facility] LAB;  Service: Cardiovascular;  Laterality: N/A;  . CARDIAC CATHETERIZATION  11/21/2015   Procedure: Coronary Stent Intervention;  Surgeon: Corky Crafts, MD;  Location: [facility]  INVASIVE CV LAB;  Service: Cardiovascular;;  . CARDIAC CATHETERIZATION N/A 01/29/2016   Procedure: Left Heart Cath and Cors/Grafts Angiography;  Surgeon: Sherren Mocha, MD;  Location: [place] CV LAB;  Service: Cardiovascular;  Laterality: N/A;  . CARDIAC CATHETERIZATION N/A 07/22/2016   Procedure: Left Heart Cath and Coronary Angiography;  Surgeon: Wellington Hampshire, MD;  Location: [place] [facility] LAB;  Service: Cardiovascular;  Laterality: N/A;  . CAROTID STENT INSERTION Bilateral 2011-2012   right-left  . CATARACT EXTRACTION W/ INTRAOCULAR LENS  IMPLANT, BILATERAL Bilateral 2009-2010  . CHOLECYSTECTOMY OPEN  ~ 2014  . CORONARY ANGIOPLASTY    . CORONARY ANGIOPLASTY WITH STENT PLACEMENT  11/21/2015  . CORONARY ARTERY BYPASS GRAFT  08/1995  . DILATION AND CURETTAGE OF UTERUS  X 1  . 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 TONSILLECTOMY  ~ 1947   2nd grade    Medications Prior to Admission  Medication Sig Dispense Refill Last Dose  . Adalimumab (HUMIRA PEN [place]) Inject 1 application into the skin See admin instructions. Patient takes it twice monthly.The next one is due June 26th     . albuterol (PROVENTIL HFA;VENTOLIN HFA) 108 (90 Base) MCG/ACT inhaler Inhale 2 puffs into the lungs every 6 (six) hours as needed for wheezing or shortness of breath. (Patient not taking: Reported on 12/18/2019) 1 Inhaler 0   . aspirin EC 81 MG tablet Take 81 mg by mouth at bedtime.     [name] atorvastatin (LIPITOR) 80 MG tablet Take 1 tablet (80 mg total) by mouth at bedtime. (Patient taking differently: Take 40 mg by mouth at bedtime. ) 30 tablet 3   . bimatoprost (LUMIGAN) 0.01 % SOLN Place 1 drop into both eyes at bedtime.     . clopidogrel (PLAVIX) 75 MG tablet Take 1 tablet (75 mg total) by mouth every morning. 30 tablet 2   . gabapentin (NEURONTIN) 100 MG capsule Take 100-200 mg by mouth daily as needed.     [name] glipiZIDE (GLUCOTROL) 10 MG tablet Take 10 mg by mouth daily before breakfast.     . guaiFENesin-dextromethorphan (ROBITUSSIN DM) 100-10 MG/5ML syrup Take 5 mLs every 4 (four) hours as needed by mouth for cough. 118 mL 0   . Insulin Glargine (LANTUS SOLOSTAR) 100 UNIT/ML Solostar Pen Inject 18 Units into the skin at bedtime. (Patient not taking: Reported on 12/18/2019) 15 mL 11   . insulin lispro (HUMALOG) 100 UNIT/ML injection Inject 0-6  Units into the skin 3 (three) times daily before meals.      . isosorbide mononitrate (IMDUR) 30 MG 24 hr tablet Take 1 tablet (30 mg total) by mouth daily. (Patient not taking: Reported on 12/18/2019) 30 tablet 2   . lamoTRIgine (LAMICTAL) 25 MG tablet Take 3 tablets (75 mg total) by mouth 2 (two) times daily. 90 tablet 2   . levETIRAcetam (KEPPRA) 750 MG tablet Take 750 mg by mouth 2 (two) times daily.     . metFORMIN (GLUCOPHAGE) 1000 MG tablet Take 1 tablet (1,000 mg total) by mouth 2 (two) times daily.     . metoprolol tartrate (LOPRESSOR) 25 MG tablet Take 1 tablet (25 mg total) by mouth at bedtime. (Patient not taking: Reported on 12/18/2019) 30 tablet 12   . nitroGLYCERIN (NITROSTAT) 0.4 MG SL tablet Place 0.4 mg under the tongue every 5 (five) minutes as needed for chest pain.     [name] nystatin cream (MYCOSTATIN) Apply 1 application topically 2 (  two) times daily as needed (yeast infection).      . ranitidine (ZANTAC) 150 MG tablet Take 150 mg by mouth every morning.      . ranolazine (RANEXA) 500 MG 12 hr tablet Take 1 tablet (500 mg total) by mouth 2 (two) times daily. 60 tablet 1   . senna-docusate (SENOKOT-S) 8.6-50 MG tablet Take 1 tablet 2 (two) times daily by mouth. 1 tablet    . timolol (TIMOPTIC) 0.5 % ophthalmic solution Place 1 drop into both eyes 2 (two) times daily.     [name] triamcinolone ointment (KENALOG) 0.1 % Apply 1 application topically 3 (three) times daily as needed (itching from psoriasis).     . vitamin B-12 (CYANOCOBALAMIN) 1000 MCG tablet Take 500 mcg by mouth every morning.      . vitamin E 400 UNIT capsule Take 400 Units by mouth every morning.       Social History   Socioeconomic History  . Marital status: Widowed    Spouse name: Not on file  . Number of children: Not on file  . Years of education: Not on file  . Highest education level: Not on file  Occupational History  . Not on file  Tobacco Use  . Smoking status: Never Smoker  . Smokeless tobacco: Never  Used  Substance and Sexual Activity  . Alcohol use: No  . Drug use: No  . Sexual activity: Never  Other Topics Concern  . Not on file  Social History Narrative   Lives at home with one of her sons. Independent at baseline.   Social Determinants of Health   Financial Resource Strain:   . Difficulty of Paying Living Expenses:   Food Insecurity:   . Worried About Programme researcher, broadcasting/film/video in the Last Year:   . Barista in the Last Year:   Transportation Needs:   . Freight forwarder (Medical):   [name] Lack of Transportation (Non-Medical):   Physical Activity:   . Days of Exercise per Week:   . Minutes of Exercise per Session:   Stress:   . Feeling of Stress :   Social Connections:   . Frequency of Communication with Friends and Family:   . Frequency of Social Gatherings with Friends and Family:   . Attends Religious Services:   . Active Member of Clubs or Organizations:   . Attends Banker Meetings:   [name] Marital Status:   Intimate Partner Violence:   . Fear of Current or Ex-Partner:   . Emotionally Abused:   [name] Physically Abused:   . Sexually Abused:     Family History  Problem Relation Age of Onset  . Heart attack Father   . Diabetes Mother   . Cancer Mother   . Stroke Mother   . Breast cancer Cousin   . Breast cancer Cousin   . Breast cancer Cousin       Review of systems complete and found to be negative unless listed above      PHYSICAL EXAM  General: Well developed, well nourished, respiratory failure on the vent HEENT:  Normocephalic and atramatic Neck:  No JVD.  Lungs: Crackles bilaterally to auscultation and percussion. Heart: HRRR . Normal S1 and S2 without gallops or murmurs.  Abdomen: Bowel sounds are positive, abdomen soft and non-tender  Msk:  Back normal, normal gait. Normal strength and tone for age. Extremities: No clubbing, cyanosis or edema.   Neuro: Alert and oriented X 3. Psych:  Peri [place]  affect, responds  appropriately  Labs:   Lab Results  Component Value Date   WBC 12.1 (H) 12/28/2019   HGB 8.2 (L) 12/28/2019   HCT 26.5 (L) 12/28/2019   MCV 98.5 12/28/2019   PLT 310 12/28/2019    Recent Labs  Lab 12/28/19 1601  NA 134*  K 5.1  CL 105  CO2 18*  BUN 23  CREATININE 0.91  CALCIUM 9.1  PROT 7.9  BILITOT 0.9  ALKPHOS 73  ALT 23  AST 29  GLUCOSE 256*   Lab Results  Component Value Date   CKTOTAL 91 12/14/2014   CKMB 4.6 12/14/2014   TROPONINI <0.03 12/17/2017    Lab Results  Component Value Date   CHOL 137 12/28/2019   CHOL 158 06/26/2017   CHOL 140 07/22/2016   Lab Results  Component Value Date   HDL 44 12/28/2019   HDL 48 06/26/2017   HDL 44 07/22/2016   Lab Results  Component Value Date   LDLCALC 68 12/28/2019   LDLCALC 47 06/26/2017   LDLCALC 65 07/22/2016   Lab Results  Component Value Date   TRIG 127 12/28/2019   TRIG 316 (H) 06/26/2017   TRIG 153 (H) 07/22/2016   Lab Results  Component Value Date   CHOLHDL 3.1 12/28/2019   CHOLHDL 3.3 06/26/2017   CHOLHDL 3.2 07/22/2016   No results found for: LDLDIRECT    Radiology: No results found.  EKG: Normal sinus rhythm ST elevation inferior laterally with ST depression reciprocally rate of 80  ASSESSMENT AND PLAN:  STEMI presentation Covid positive Respiratory failure acute Anemia Coronary artery disease History of non-STEMI History of coronary bypass surgery Diabetes GERD History of TIA Hypertension Seizure disorder Systolic dysfunction . 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

## 2019-12-28 NOTE — H&P (Signed)
Name: Leah Small MRN: [id number] DOB: [birth year]     CONSULTATION DATE: 12/28/2019  REFERRING MD :  Clayborn Bigness  CHIEF COMPLAINT:  Chest pain    HISTORY OF PRESENT ILLNESS:   79 year old female known coronary disease history of coronary bypass surgery history of non-STEMI last cath was 2017 by Dr. Fletcher Anon  patient has chronic angina on Ranexa and nitrates diabetes hypertension hyperlipidemia seizure disorder.    Patient had acute onset of substernal chest discomfort 10 out of 10 she finally called rescue and was brought to the emergency room she taken nitroglycerin sublingual at home without significant improvement.    Patient also complained of shortness of breath not able to lie flat.  When the patient was brought in by rescue she had ST elevation on EKG no absolute no absolute change from previous EKG reviewed from 2019.    Patient has chronic diffuse changes of ST elevation and ST depression throughout but was different the patient had substernal chest crushing chest pain 10 out of 10 with shortness of breath and dyspnea.   COVID +  had not been vaccinated   During the course of cardiac cath procedure when access has been obtained the patient had respiratory failure and became apneic never lost a pulse of blood pressure but quit breathing requiring bagging and and subsequent intubation by the ER physician.     Cardiac cath was performed and was not very different from 2017 depressed left ventricular function around 35% mainly inferiorly but globally as well left ventricular enlargement.    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  Total occlusion of vein graft to RCA circumflex system and diagonal.   Patient arrives to ICU with severe multiorgan failure    BMP Latest Ref Rng & Units 12/28/2019 12/18/2019 12/17/2017  Glucose 70 - 99 mg/dL 256(H) 224(H) 131(H)  BUN 8 - 23 mg/dL 23 24(H) 14  Creatinine 0.44 - 1.00 mg/dL  0.91 1.13(H) 0.67  Sodium 135 - 145 mmol/L 134(L) 133(L) 128(L)  Potassium 3.5 - 5.1 mmol/L 5.1 5.0 4.5  Chloride 98 - 111 mmol/L 105 100 97(L)  CO2 22 - 32 mmol/L 18(L) 21(L) 23  Calcium 8.9 - 10.3 mg/dL 9.1 9.5 8.6(L)    PAST MEDICAL HISTORY :   has a past medical history of Acid reflux, Arthritis, Chronic stable angina ([place]), Coronary artery disease, Hypercholesterolemia, Hypertension, Myocardial infarction ([place]) (1991; 07/06/2015), NSTEMI (non-ST elevated myocardial infarction) ([place]) (10/22/2015), Psoriatic arthritis ([place]), Seizures ([place]), TIA (transient ischemic attack), and Type II diabetes mellitus ([place]).  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 Prior to Admission medications   Medication Sig Start Date End Date Taking? Authorizing Provider  Adalimumab (HUMIRA PEN [place]) Inject 1 application into the skin See admin instructions. Patient takes it twice monthly.The next one is due June 26th    [provider]  albuterol (PROVENTIL HFA;VENTOLIN HFA) 108 (90 Base) MCG/ACT inhaler Inhale 2 puffs into the lungs every 6 (six) hours as needed for wheezing or shortness of breath. Patient not taking:  Reported on 12/18/2019 12/17/17   Darel Hong, MD  aspirin EC 81 MG tablet Take 81 mg by mouth at bedtime.    [provider]  atorvastatin (LIPITOR) 80 MG tablet Take  1 tablet (80 mg total) by mouth at bedtime. Patient taking differently: Take 40 mg by mouth at bedtime.  11/22/15   Rai, Ripudeep K, MD  bimatoprost (LUMIGAN) 0.01 % SOLN Place 1 drop into both eyes at bedtime.    [provider]  clopidogrel (PLAVIX) 75 MG tablet Take 1 tablet (75 mg total) by mouth every morning. 08/14/17   Gladstone Lighter, MD  gabapentin (NEURONTIN) 100 MG capsule Take 100-200 mg by mouth daily as needed.    [provider]  glipiZIDE (GLUCOTROL) 10 MG tablet Take 10 mg by mouth daily before breakfast.    [provider]  guaiFENesin-dextromethorphan (ROBITUSSIN DM) 100-10 MG/5ML syrup Take 5 mLs every 4 (four) hours as needed by mouth for cough. 06/29/17   Fritzi Mandes, MD  Insulin Glargine (LANTUS SOLOSTAR) 100 UNIT/ML Solostar Pen Inject 18 Units into the skin at bedtime. Patient not taking: Reported on 12/18/2019 08/14/17   Gladstone Lighter, MD  insulin lispro (HUMALOG) 100 UNIT/ML injection Inject 0-6 Units into the skin 3 (three) times daily before meals.     [provider]  isosorbide mononitrate (IMDUR) 30 MG 24 hr tablet Take 1 tablet (30 mg total) by mouth daily. Patient not taking: Reported on 12/18/2019 08/14/17   Gladstone Lighter, MD  lamoTRIgine (LAMICTAL) 25 MG tablet Take 3 tablets (75 mg total) by mouth 2 (two) times daily. 08/14/17   Gladstone Lighter, MD  levETIRAcetam (KEPPRA) 750 MG tablet Take 750 mg by mouth 2 (two) times daily.    [provider]  metFORMIN (GLUCOPHAGE) 1000 MG tablet Take 1 tablet (1,000 mg total) by mouth 2 (two) times daily. 01/30/16   Theora Gianotti, NP  metoprolol tartrate (LOPRESSOR) 25 MG tablet Take 1 tablet (25 mg total) by mouth at bedtime. Patient not taking: Reported on 12/18/2019 07/23/16   Arbutus Leas, NP  nitroGLYCERIN (NITROSTAT) 0.4 MG SL tablet Place 0.4 mg under the tongue every 5 (five) minutes as needed for chest pain.    [provider]  nystatin  cream (MYCOSTATIN) Apply 1 application topically 2 (two) times daily as needed (yeast infection).  04/25/15   [provider]  ranitidine (ZANTAC) 150 MG tablet Take 150 mg by mouth every morning.     [provider]  ranolazine (RANEXA) 500 MG 12 hr tablet Take 1 tablet (500 mg total) by mouth 2 (two) times daily. 08/14/17 12/18/19  Gladstone Lighter, MD  senna-docusate (SENOKOT-S) 8.6-50 MG tablet Take 1 tablet 2 (two) times daily by mouth. 06/29/17   Tukov-Yual, Arlyss Gandy, NP  timolol (TIMOPTIC) 0.5 % ophthalmic solution Place 1 drop into both eyes 2 (two) times daily. 06/14/17   [provider]  triamcinolone ointment (KENALOG) 0.1 % Apply 1 application topically 3 (three) times daily as needed (itching from psoriasis).    [provider]  vitamin B-12 (CYANOCOBALAMIN) 1000 MCG tablet Take 500 mcg by mouth every morning.     [provider]  vitamin E 400 UNIT capsule Take 400 Units by mouth every morning.     [provider]   Allergies  Allergen Reactions  . Talwin [Pentazocine] Anaphylaxis and Swelling    Throat swelling  . Valium [Diazepam] Other (See Comments)    Makes her feel like she's flying  .  Vicodin [Hydrocodone-Acetaminophen] Nausea And Vomiting  . Adhesive [Tape] Other (See Comments)    Bruising and TEARING!!!  Please use "paper" tape  . Beta Adrenergic Blockers Other (See Comments)    Pt states she not allergic  . Levofloxacin Swelling and Other (See Comments)    Tongue swells   . Simvastatin Swelling and Rash    Mouth swelling    FAMILY HISTORY:  family history includes Breast cancer in her cousin, cousin, and cousin; Cancer in her mother; Diabetes in her mother; Heart attack in her father; Stroke in her mother. SOCIAL HISTORY:  reports that she has never smoked. She has never used smokeless tobacco. She reports that she does not drink alcohol or use drugs.  REVIEW OF SYSTEMS:   Unable to obtain due to critical  illness      Estimated body mass index is 18.55 kg/m as calculated from the following:   Height as of 08/10/17: '5\' 8"'$  (1.727 m).   Weight as of 12/18/19: 55.3 kg.    VITAL SIGNS: Pulse Rate:  [105] 105 (05/06 1558) Resp:  [20] 20 (05/06 1558) BP: (176)/(101) 176/101 (05/06 1558) SpO2:  [98 %-99 %] 99 % (05/06 1617) FiO2 (%):  [100 %] 100 % (05/06 1655)   No intake/output data recorded. No intake/output data recorded.   SpO2: 99 % O2 Flow Rate (L/min): 2 L/min FiO2 (%): 100 %   Physical Examination:  GENERAL:critically ill appearing, +resp distress HEAD: Normocephalic, atraumatic.  EYES: Pupils equal, round, reactive to light.  No scleral icterus.  MOUTH: Moist mucosal membrane. NECK: Supple. No JVD.  PULMONARY: +rhonchi, +wheezing CARDIOVASCULAR: S1 and S2. Regular rate and rhythm. No murmurs, rubs, or gallops.  GASTROINTESTINAL: Soft, nontender, -distended.  Positive bowel sounds.  MUSCULOSKELETAL: No swelling, clubbing, or edema.  NEUROLOGIC: obtunded SKIN:intact,warm,dry  I personally reviewed lab work that was obtained in last 24 hrs. CXR Independently reviewed-b/l infiltrates  MEDICATIONS: I have reviewed all medications and confirmed regimen as documented   CULTURE RESULTS   Recent Results (from the past 240 hour(s))  Respiratory Panel by RT PCR (Flu A&B, Covid) - Nasopharyngeal Swab     Status: Abnormal   Collection Time: 12/28/19  4:01 PM   Specimen: Nasopharyngeal Swab  Result Value Ref Range Status   SARS Coronavirus 2 by RT PCR POSITIVE (A) NEGATIVE Final    Comment: RESULT CALLED TO, READ BACK BY AND VERIFIED WITH: DR.COVERT(CATH) AT 1707 ON 12/28/2019 BY MOSLEY,J (NOTE) SARS-CoV-2 target nucleic acids are DETECTED. SARS-CoV-2 RNA is generally detectable in upper respiratory specimens  during the acute phase of infection. Positive results are indicative of the presence of the identified virus, but do not rule out bacterial infection or  co-infection with other pathogens not detected by the test. Clinical correlation with patient history and other diagnostic information is necessary to determine patient infection status. The expected result is Negative. Fact Sheet for Patients:  PinkCheek.be Fact Sheet for Healthcare Providers: GravelBags.it This test is not yet approved or cleared by the [place] FDA and  has been authorized for detection and/or diagnosis of SARS-CoV-2 by FDA under an Emergency Use Authorization (EUA).  This EUA will remain in effect (meaning this test ca n be used) for the duration of  the COVID-19 declaration under Section 564(b)(1) of the Act, 21 U.S.C. section 360bbb-3(b)(1), unless the authorization is terminated or revoked sooner.    Influenza A by PCR NEGATIVE NEGATIVE Final   Influenza B by PCR NEGATIVE NEGATIVE Final  Comment: (NOTE) The Xpert Xpress SARS-CoV-2/FLU/RSV assay is intended as an aid in  the diagnosis of influenza from Nasopharyngeal swab specimens and  should not be used as a sole basis for treatment. Nasal washings and  aspirates are unacceptable for Xpert Xpress SARS-CoV-2/FLU/RSV  testing. Fact Sheet for Patients: PinkCheek.be Fact Sheet for Healthcare Providers: GravelBags.it This test is not yet approved or cleared by the [place] FDA and  has been authorized for detection and/or diagnosis of SARS-CoV-2 by  FDA under an Emergency Use Authorization (EUA). This EUA will remain  in effect (meaning this test can be used) for the duration of the  Covid-19 declaration under Section 564(b)(1) of the Act, 21  U.S.C. section 360bbb-3(b)(1), unless the authorization is  terminated or revoked. Performed at [facility], [address] [place], [place] [address]            Indwelling Urinary Catheter continued, requirement due to    Reason to continue Indwelling Urinary Catheter strict Intake/Output monitoring for hemodynamic instability         Ventilator continued, requirement due to severe respiratory failure   Ventilator Sedation RASS 0 to -2      ASSESSMENT AND PLAN SYNOPSIS   Severe ACUTE Hypoxic and Hypercapnic Respiratory Failure due to STEMI and COVID 19 pneumonia -continue Full MV support -continue Bronchodilator Therapy -Wean Fio2 and PEEP as tolerated -will perform SAT/SBT when respiratory parameters are met -VAP/VENT bundle implementation  ACUTE SYSTOLIC CARDIAC FAILURE- EF 35% -oxygen as needed -Lasix as tolerated -follow up cardiology recs   Severe COVID-19 infection, ARDS and pneumonia/pneumonitis Continue IV steroids  IV remdisivir  proning as tolerated due to severe hypoxia   Maintain airborne and contact precautions  As needed bronchodilators (MDI) Vitamin C and zinc Antitussives High risk for death   NEUROLOGY - intubated and sedated - minimal sedation to achieve a RASS goal: -1    CARDIAC ICU monitoring  ID -continue IV abx as prescibed -follow up cultures  GI GI PROPHYLAXIS as indicated  NUTRITIONAL STATUS DIET-->TF's as tolerated Constipation protocol as indicated   ENDO - will use ICU hypoglycemic\Hyperglycemia protocol if needed    ELECTROLYTES -follow labs as needed -replace as needed -pharmacy consultation and following   DVT/GI PRX ordered TRANSFUSIONS AS NEEDED MONITOR FSBS ASSESS the need for LABS    Critical Care Time devoted to patient care services described in this note is 55 minutes.   Overall, patient is critically ill, prognosis is guarded.  Patient with Multiorgan failure and at high risk for cardiac arrest and death.    Corrin Parker, M.D.  [facility]  Medical Director [place] Director [facility] Cardio-Pulmonary Department

## 2019-12-28 NOTE — Consult Note (Signed)
Remdesivir - Pharmacy Brief Note   A/P:  Patient is COVID + and presented to the ED with substernal chest pain. During cardiac cath procedure patient became apneic which eventually led to intubation.    Remdesivir 200 mg IVPB once followed by 100 mg IVPB daily x 4 days.   Cephus Shelling, PharmD Clinical Pharmacist  12/28/2019 6:55 PM

## 2019-12-28 NOTE — Progress Notes (Signed)
Assisted tele visit to patient with family member.  Shakora Nordquist Samson, RN  

## 2019-12-28 NOTE — Progress Notes (Signed)
Ch arrived at room in response to Code STEMI to ED-1. Upon arrival, Cardiologist, physician, and RNs were inspecting Pt. Pt was on bed answering questions. Pt was taken to Cath Lab. Ch attempted to find presence of family, from RN and Secondary school teacher, they were unaware. Ch went to Triage nurse and found that Pt's son was present at the ED waiting area. Ch accompanied Pt's son Dorinda Hill while he was being screened, and took him to the Special Procedures Recovery area. Ch took [employer] phone number and left it at the Cath Lab control room for communication. Ch had to leave temporarily to ICU for a death. CH returned to [facility] Recovery waiting area to sit with Dorinda Hill for a while. Dorinda Hill reported that his mother's condition is not a surprise, and it has been an ongoing thing for a couple years now. Ch prayed with Dorinda Hill. Ch let him know that chaplains are available any time for support. Pt was later moved to ICU -11, intubated.

## 2019-12-29 ENCOUNTER — Inpatient Hospital Stay: Payer: Medicare Other

## 2019-12-29 ENCOUNTER — Encounter: Payer: Self-pay | Admitting: Cardiology

## 2019-12-29 ENCOUNTER — Inpatient Hospital Stay
Admit: 2019-12-29 | Discharge: 2019-12-29 | Disposition: A | Discharge status: Home / Self Care | Payer: Medicare Other | Attending: Internal Medicine | Admitting: Internal Medicine

## 2019-12-29 DIAGNOSIS — J9601 Acute respiratory failure with hypoxia: Secondary | ICD-10-CM

## 2019-12-29 LAB — COMPREHENSIVE METABOLIC PANEL
ALT: 31 U/L (ref 0–44)
AST: 48 U/L — ABNORMAL HIGH (ref 15–41)
Albumin: 4 g/dL (ref 3.5–5.0)
Alkaline Phosphatase: 73 U/L (ref 38–126)
Anion gap: 14 (ref 5–15)
BUN: 25 mg/dL — ABNORMAL HIGH (ref 8–23)
CO2: 19 mmol/L — ABNORMAL LOW (ref 22–32)
Calcium: 8.9 mg/dL (ref 8.9–10.3)
Chloride: 100 mmol/L (ref 98–111)
Creatinine, Ser: 0.94 mg/dL (ref 0.44–1.00)
GFR calc Af Amer: >60 mL/min (ref >60)
GFR calc non Af Amer: 58 mL/min — ABNORMAL LOW (ref >60)
Glucose, Bld: 228 mg/dL — ABNORMAL HIGH (ref 70–99)
Potassium: 3.4 mmol/L — ABNORMAL LOW (ref 3.5–5.1)
Sodium: 133 mmol/L — ABNORMAL LOW (ref 135–145)
Total Bilirubin: 0.9 mg/dL (ref 0.3–1.2)
Total Protein: 7.8 g/dL (ref 6.5–8.1)

## 2019-12-29 LAB — GLUCOSE, CAPILLARY
Glucose-Capillary: 253 mg/dL — ABNORMAL HIGH (ref 70–99)
Glucose-Capillary: 193 mg/dL — ABNORMAL HIGH (ref 70–99)
Glucose-Capillary: 140 mg/dL — ABNORMAL HIGH (ref 70–99)
Glucose-Capillary: 128 mg/dL — ABNORMAL HIGH (ref 70–99)
Glucose-Capillary: 121 mg/dL — ABNORMAL HIGH (ref 70–99)

## 2019-12-29 LAB — HEPARIN LEVEL (UNFRACTIONATED)
Heparin Unfractionated: 0.29 IU/mL — ABNORMAL LOW (ref 0.30–0.70)
Heparin Unfractionated: 0.67 IU/mL (ref 0.30–0.70)

## 2019-12-29 LAB — CBC WITH DIFFERENTIAL/PLATELET
Abs Immature Granulocytes: 0.05 10*3/uL (ref 0.00–0.07)
Basophils Absolute: 0 10*3/uL (ref 0.0–0.1)
Basophils Relative: 0 %
Eosinophils Absolute: 0 10*3/uL (ref 0.0–0.5)
Eosinophils Relative: 0 %
HCT: 25.5 % — ABNORMAL LOW (ref 36.0–46.0)
Hemoglobin: 8 g/dL — ABNORMAL LOW (ref 12.0–15.0)
Immature Granulocytes: 1 %
Lymphocytes Relative: 5 %
Lymphs Abs: 0.4 10*3/uL — ABNORMAL LOW (ref 0.7–4.0)
MCH: 30.1 pg (ref 26.0–34.0)
MCHC: 31.4 g/dL (ref 30.0–36.0)
MCV: 95.9 fL (ref 80.0–100.0)
Monocytes Absolute: 0.3 10*3/uL (ref 0.1–1.0)
Monocytes Relative: 4 %
Neutro Abs: 8.4 10*3/uL — ABNORMAL HIGH (ref 1.7–7.7)
Neutrophils Relative %: 90 %
Platelets: 224 10*3/uL (ref 150–400)
RBC: 2.66 MIL/uL — ABNORMAL LOW (ref 3.87–5.11)
RDW: 17.8 % — ABNORMAL HIGH (ref 11.5–15.5)
WBC: 9.3 10*3/uL (ref 4.0–10.5)
nRBC: 0 % (ref 0.0–0.2)

## 2019-12-29 LAB — MAGNESIUM: Magnesium: 1.4 mg/dL — ABNORMAL LOW (ref 1.7–2.4)

## 2019-12-29 LAB — FIBRINOGEN: Fibrinogen: 462 mg/dL (ref 210–475)

## 2019-12-29 LAB — C-REACTIVE PROTEIN
CRP: 1 mg/dL — ABNORMAL HIGH (ref <1.0)
CRP: 2.5 mg/dL — ABNORMAL HIGH (ref <1.0)

## 2019-12-29 LAB — ECHOCARDIOGRAM COMPLETE

## 2019-12-29 LAB — TROPONIN I (HIGH SENSITIVITY): Troponin I (High Sensitivity): 2712 ng/L (ref <18)

## 2019-12-29 LAB — FIBRIN DERIVATIVES D-DIMER (ARMC ONLY): Fibrin derivatives D-dimer (ARMC): 1055.62 ng/mL (FEU) — ABNORMAL HIGH (ref 0.00–499.00)

## 2019-12-29 MED ORDER — BRINZOLAMIDE 1 % OP SUSP
1.0000 [drp] | Freq: Two times a day (BID) | OPHTHALMIC | Status: DC
  Administered 2019-12-29 – 2020-01-03 (×4): 1 [drp] via OPHTHALMIC
  Filled 2019-12-29: qty 10

## 2019-12-29 MED ORDER — LEVETIRACETAM 100 MG/ML PO SOLN
750.0000 mg | Freq: Two times a day (BID) | ORAL | Status: DC
  Administered 2019-12-29: 750 mg
  Filled 2019-12-29 (×3): qty 7.5

## 2019-12-29 MED ORDER — POTASSIUM CHLORIDE 20 MEQ/15ML (10%) PO SOLN
20.0000 meq | Freq: Once | ORAL | Status: DC
  Filled 2019-12-29: qty 15

## 2019-12-29 MED ORDER — CHLORHEXIDINE GLUCONATE 0.12% ORAL RINSE (MEDLINE KIT)
15.0000 mL | Freq: Two times a day (BID) | OROMUCOSAL | Status: DC
  Administered 2019-12-29 – 2020-01-03 (×11): 15 mL via OROMUCOSAL

## 2019-12-29 MED ORDER — CHLORHEXIDINE GLUCONATE CLOTH 2 % EX PADS
6.0000 | MEDICATED_PAD | Freq: Every day | CUTANEOUS | Status: DC
  Administered 2019-12-30 – 2020-01-03 (×5): 6 via TOPICAL

## 2019-12-29 MED ORDER — BRIMONIDINE TARTRATE 0.2 % OP SOLN
1.0000 [drp] | Freq: Two times a day (BID) | OPHTHALMIC | Status: DC
  Administered 2019-12-29 – 2020-01-03 (×11): 1 [drp] via OPHTHALMIC
  Filled 2019-12-29: qty 5

## 2019-12-29 MED ORDER — LAMOTRIGINE 25 MG PO TABS
100.0000 mg | ORAL_TABLET | Freq: Every day | ORAL | Status: DC
  Administered 2019-12-29 – 2019-12-30 (×2): 100 mg
  Filled 2019-12-29 (×3): qty 4

## 2019-12-29 MED ORDER — ORAL CARE MOUTH RINSE
15.0000 mL | OROMUCOSAL | Status: DC
  Administered 2019-12-29 – 2019-12-31 (×15): 15 mL via OROMUCOSAL

## 2019-12-29 MED ORDER — MAGNESIUM SULFATE 2 GM/50ML IV SOLN
2.0000 g | Freq: Once | INTRAVENOUS | Status: AC
  Administered 2019-12-29: 2 g via INTRAVENOUS
  Filled 2019-12-29: qty 50

## 2019-12-29 MED ORDER — POTASSIUM CHLORIDE 20 MEQ/15ML (10%) PO SOLN
20.0000 meq | Freq: Once | ORAL | Status: AC
  Administered 2019-12-29: 20 meq
  Filled 2019-12-29: qty 15

## 2019-12-29 MED ORDER — TIMOLOL MALEATE 0.5 % OP SOLN
1.0000 [drp] | Freq: Two times a day (BID) | OPHTHALMIC | Status: DC
  Administered 2019-12-29 – 2020-01-03 (×11): 1 [drp] via OPHTHALMIC
  Filled 2019-12-29: qty 5

## 2019-12-29 NOTE — Progress Notes (Signed)
Pt extubated at this time. Alert and oriented. VSS. Room air.

## 2019-12-29 NOTE — Progress Notes (Signed)
Pt extubated with complications, no stridor noted, sats 98%, respiratory rate 18/min, will continue to monitor.

## 2019-12-29 NOTE — Consult Note (Signed)
PHARMACY CONSULT NOTE  Pharmacy Consult for Electrolyte Monitoring and Replacement   Recent Labs: Potassium (mmol/L)  Date Value  12/29/2019 3.4 (L)  12/15/2014 4.2   Magnesium (mg/dL)  Date Value  76/14/7092 1.4 (L)   Calcium (mg/dL)  Date Value  95/74/7340 8.9   Calcium, Total (mg/dL)  Date Value  37/04/6437 8.6 (L)   Albumin (g/dL)  Date Value  38/18/4037 4.0  12/14/2014 3.9   Sodium (mmol/L)  Date Value  12/29/2019 133 (L)  12/15/2014 133 (L)   Add-On magnesium: 1.4 mg/dL  Assessment: 79 year-old female who presented with chest pain that radiated to her left arm, determined to be a STEMI. The patient had respiratory failure on 5/6 during the cardiac cath procedure and was transferred to CCU. She is currently intubated.  Goal of Therapy:  Potassium 4.0 - 5.1 mmol/L Magnesium 2.0 - 2.4 mg/dL All Other Electrolytes WNL  Plan:   IV magnesium sulfate 2 grams x 1  KCl 20 mEq per tube BID x 2  Next electrolytes in am  Lowella Bandy ,PharmD Clinical Pharmacist 12/29/2019 7:26 AM

## 2019-12-29 NOTE — Progress Notes (Signed)
[facility] Encounter Note  Patient: Leah Small / Admit Date: 12/28/2019 / Date of Encounter: 12/29/2019, 3:46 PM   Subjective: Patient continues to have critical care due to acute respiratory failure and acute myocardial infarction. Cardiac catheterization showing severe diffuse three-vessel coronary artery disease with patent LIMA to the LAD as previously evaluated by cardiac catheterization in 2017 with no amenable intervention.  Review of Systems: Cannot assess   objective: Telemetry: Sinus tachycardia Physical Exam: Blood pressure (!) 165/86, pulse (!) 102, temperature 98.8 F (37.1 C), temperature source Axillary, resp. rate 20, SpO2 98 %. There is no height or weight on file to calculate BMI.   As per medicine  Intake/Output Summary (Last 24 hours) at 12/29/2019 1546 Last data filed at 12/29/2019 1157 Gross per 24 hour  Intake 904.44 ml  Output 1920 ml  Net -1015.56 ml    Inpatient Medications:  . vitamin C  500 mg Per Tube Daily  . aspirin  324 mg Oral Once  . aspirin  81 mg Oral Daily  . brimonidine  1 drop Both Eyes BID  . brinzolamide  1 drop Both Eyes BID  . chlorhexidine gluconate (MEDLINE KIT)  15 mL Mouth Rinse BID  . Chlorhexidine Gluconate Cloth  6 each Topical Daily  . clopidogrel  75 mg Oral Q breakfast  . docusate  100 mg Oral BID  . insulin aspart  0-20 Units Subcutaneous Q4H  . lamoTRIgine  100 mg Per Tube Daily  . levETIRAcetam  750 mg Per Tube BID  . mouth rinse  15 mL Mouth Rinse 10 times per day  . methylPREDNISolone (SOLU-MEDROL) injection  40 mg Intravenous Q12H  . polyethylene glycol  17 g Oral Daily  . potassium chloride  20 mEq Per Tube Once  . sodium chloride flush  3 mL Intravenous Q12H  . timolol  1 drop Both Eyes BID  . zinc sulfate  220 mg Per Tube Daily   Infusions:  . sodium chloride 10 mL/hr at 12/28/19 2000  . sodium chloride 10 mL/hr at 12/29/19 0600  . famotidine (PEPCID) IV Stopped (12/29/19 0849)  .  heparin 750 Units/hr (12/29/19 1157)  . propofol (DIPRIVAN) infusion 25 mcg/kg/min (12/29/19 1157)  . remdesivir 100 mg in NS 100 mL Stopped (12/29/19 0947)    Labs: Recent Labs    12/28/19 1601 12/29/19 0106 12/29/19 1009  NA 134* 133*  --   K 5.1 3.4*  --   CL 105 100  --   CO2 18* 19*  --   GLUCOSE 256* 228*  --   BUN 23 25*  --   CREATININE 0.91 0.94  --   CALCIUM 9.1 8.9  --   MG  --   --  1.4*   Recent Labs    12/28/19 1601 12/29/19 0106  AST 29 48*  ALT 23 31  ALKPHOS 73 73  BILITOT 0.9 0.9  PROT 7.9 7.8  ALBUMIN 4.4 4.0   Recent Labs    12/28/19 1601 12/29/19 0106  WBC 12.1* 9.3  NEUTROABS 10.1* 8.4*  HGB 8.2* 8.0*  HCT 26.5* 25.5*  MCV 98.5 95.9  PLT 310 224   No results for input(s): CKTOTAL, CKMB, TROPONINI in the last 72 hours. Invalid input(s): POCBNP Recent Labs    12/28/19 1601  HGBA1C 6.1*     Weights: There were no vitals filed for this visit.   Radiology/Studies:  DG Abd 1 View  Result Date: 12/28/2019 CLINICAL DATA:  Tube placement  EXAM: ABDOMEN - 1 VIEW COMPARISON:  None. FINDINGS: The enteric tube projects over the gastric body. There is a large amount of stool in the partially visualized colon. The visualized bowel gas pattern is unremarkable. IMPRESSION: Enteric tube projects over the gastric body. Large stool burden in the partially visualized colon. Electronically Signed   By: Constance Holster M.D.   On: 12/28/2019 18:50   CARDIAC CATHETERIZATION  Result Date: 12/28/2019  Prox LAD lesion is 100% stenosed.  Dist LAD lesion is 30% stenosed.  Non-stenotic Ost Ramus to Ramus lesion was previously treated.  Ramus lesion is 30% stenosed.  Mid Cx lesion is 100% stenosed.  Prox Cx lesion is 90% stenosed.  Prox RCA to Mid RCA lesion is 90% stenosed.  Mid RCA lesion is 100% stenosed.  LIMA. No significant disease with insertion into the mid LAD  Prox Graft lesion is 100% stenosed.  Prox Graft lesion is 100% stenosed.  Prox Graft  lesion is 100% stenosed.  Conclusion STEMI presentation with cardiac cath Left heart cath with grafts intervention was deferred Multivessel native coronary disease moderate disease in left main totally occluded ostial LAD proximal circumflex ostial right Totally occluded SVG to RCA circumflex and diagonal LIMA to mid LAD was normal Left ventriculogram showed moderately depressed left ventricular function around 35% globally Unsuccessful minx FemoStop was placed Patient was intubated sedated Case was then transferred to ICU critical care team for management Conservative cardiac input at this point   DG Chest Port 1 View  Result Date: 12/29/2019 CLINICAL DATA:  Acute respiratory failure EXAM: PORTABLE CHEST 1 VIEW COMPARISON:  Radiograph 12/28/2019 FINDINGS: Low positioning of the endotracheal tube which approximates the orifice of the right mainstem bronchus, recommend retraction at least 3 cm to the mid trachea. Transesophageal tube tip below the GE junction, beyond the level of imaging. Telemetry leads overlie the chest. Mild hyperexpansion of the lungs with some central vascular congestion. Increasing prominence of the interstitium with some more hazy opacity in the right lung base. No visible pneumothorax or effusion. 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 No acute osseous or soft tissue abnormality. Degenerative changes are present in the imaged spine and shoulders. IMPRESSION: 1. Low positioning of the endotracheal tube which approximates the orifice of the right mainstem bronchus. Recommend retraction at least 3 cm. 2. Satisfactory positioning of the transesophageal tube. 3. Increasing prominence of the interstitium with some more hazy opacity in the right lung base, could reflect developing edema or infection. These results will be called to the ordering clinician or representative by the Radiologist Assistant, and communication documented in the PACS or [employer]. Electronically Signed   By: Lovena Le M.D.   On: 12/29/2019 05:50   DG Chest Port 1 View  Result Date: 12/28/2019 CLINICAL DATA:  Dyspnea. EXAM: PORTABLE CHEST 1 VIEW COMPARISON:  12/17/2017 FINDINGS: The endotracheal tube terminates above the carina by approximately 2.6 cm. The enteric tube extends below the left hemidiaphragm. The heart size is mildly enlarged. The patient is status post prior median sternotomy. There is prominence of the pulmonary vasculature. There are atherosclerotic changes of the thoracic aorta. The lungs appear slightly hyperexpanded. There are prominent interstitial lung markings. There is no definite acute osseous abnormality. IMPRESSION: 1. Lines and tubes as above. 2. Findings suggestive of developing interstitial edema. 3.  Aortic Atherosclerosis (ICD10-I70.0). Electronically Signed   By: Constance Holster M.D.   On: 12/28/2019 18:50     Assessment and Recommendation  79 y.o. female with known three-vessel coronary artery disease hypertension hyperlipidemia with chronic anginal symptoms having acute respiratory failure multifactorial in nature including myocardial infarction status post intubation with no coronary artery disease amenable to intervention at this time 1.  Continue supportive care for respiratory failure and infection 2.  No further cardiac intervention and/or diagnostics necessary at this time 3.  Continue heparin for further risk reduction of deep venous thrombosis as well as myocardial infarction treatment with Plavix as able 4.  Reinstatement of Ranexa and isosorbide in the future when pressure allows 5.  Follow closely for concerns of poor prognosis  Signed, Serafina Royals M.D. FACC

## 2019-12-29 NOTE — Progress Notes (Signed)
*  PRELIMINARY RESULTS* Echocardiogram 2D Echocardiogram has been performed.  Leah Small [name] 12/29/2019, 10:23 AM

## 2019-12-29 NOTE — Progress Notes (Signed)
Follow up - Critical Care Medicine Note  Patient Details:    Leah Small is an 79 y.o. female patient with known advanced coronary disease presented with chest discomfort brought by EMS to [facility] ED noted had emergent cardiac catheterization, had apneic episode during catheterization and had to be intubated.  Now on ventilator.  Incidentally COVID-19 test was positive.  Lines, Airways, Drains: Urethral Catheter ashley williams RN Non-latex 16 Fr. (Active)  Indication for Insertion or Continuance of Catheter Therapy based on hourly urine output monitoring and documentation for critical condition (NOT STRICT I&O) 12/29/19 1600  Site Assessment Clean;Intact 12/29/19 1913  Catheter Maintenance Bag below level of bladder;Catheter secured;Drainage bag/tubing not touching floor;Insertion date on drainage bag;No dependent loops;Seal intact 12/29/19 1913  Collection Container Standard drainage bag 12/29/19 1913  Securement Method Securing device (Describe) 12/29/19 1913  Urinary Catheter Interventions (if applicable) Unclamped 15/17/61 1600  Output (mL) 350 mL 12/29/19 1959    Anti-infectives:  Anti-infectives (From admission, onward)   Start     Dose/Rate Route Frequency Ordered Stop   12/29/19 1000  remdesivir 100 mg in sodium chloride 0.9 % 100 mL IVPB     100 mg 200 mL/hr over 30 Minutes Intravenous Daily 12/28/19 1859 01/02/20 0959   12/28/19 2100  remdesivir 200 mg in sodium chloride 0.9% 250 mL IVPB     200 mg 580 mL/hr over 30 Minutes Intravenous Once 12/28/19 1859 12/28/19 2237      Microbiology: Results for orders placed or performed during the hospital encounter of 12/28/19  Respiratory Panel by RT PCR (Flu A&B, Covid) - Nasopharyngeal Swab     Status: Abnormal   Collection Time: 12/28/19  4:01 PM   Specimen: Nasopharyngeal Swab  Result Value Ref Range Status   SARS Coronavirus 2 by RT PCR POSITIVE (A) NEGATIVE Final    Comment: RESULT CALLED TO, READ BACK BY AND VERIFIED  WITH: DR.COVERT(CATH) AT 1707 ON 12/28/2019 BY MOSLEY,J (NOTE) SARS-CoV-2 target nucleic acids are DETECTED. SARS-CoV-2 RNA is generally detectable in upper respiratory specimens  during the acute phase of infection. Positive results are indicative of the presence of the identified virus, but do not rule out bacterial infection or co-infection with other pathogens not detected by the test. Clinical correlation with patient history and other diagnostic information is necessary to determine patient infection status. The expected result is Negative. Fact Sheet for Patients:  PinkCheek.be Fact Sheet for Healthcare Providers: GravelBags.it This test is not yet approved or cleared by the [place] FDA and  has been authorized for detection and/or diagnosis of SARS-CoV-2 by FDA under an Emergency Use Authorization (EUA).  This EUA will remain in effect (meaning this test ca n be used) for the duration of  the COVID-19 declaration under Section 564(b)(1) of the Act, 21 U.S.C. section 360bbb-3(b)(1), unless the authorization is terminated or revoked sooner.    Influenza A by PCR NEGATIVE NEGATIVE Final   Influenza B by PCR NEGATIVE NEGATIVE Final    Comment: (NOTE) The Xpert Xpress SARS-CoV-2/FLU/RSV assay is intended as an aid in  the diagnosis of influenza from Nasopharyngeal swab specimens and  should not be used as a sole basis for treatment. Nasal washings and  aspirates are unacceptable for Xpert Xpress SARS-CoV-2/FLU/RSV  testing. Fact Sheet for Patients: PinkCheek.be Fact Sheet for Healthcare Providers: GravelBags.it This test is not yet approved or cleared by the [place] FDA and  has been authorized for detection and/or diagnosis of SARS-CoV-2 by  FDA under an Emergency  Use Authorization (EUA). This EUA will remain  in effect (meaning this test can be used)  for the duration of the  Covid-19 declaration under Section 564(b)(1) of the Act, 21  U.S.C. section 360bbb-3(b)(1), unless the authorization is  terminated or revoked. Performed at [facility], [address], [place], [place] [address]   MRSA PCR Screening     Status: None   Collection Time: 12/28/19  6:16 PM   Specimen: Urine, Catheterized; Nasopharyngeal  Result Value Ref Range Status   MRSA by PCR NEGATIVE NEGATIVE Final    Comment:        The GeneXpert MRSA Assay (FDA approved for NASAL specimens only), is one component of a comprehensive MRSA colonization surveillance program. It is not intended to diagnose MRSA infection nor to guide or monitor treatment for MRSA infections. Performed at [facility], [address] [place], [place] [address]     Best Practice/Protocols:  VTE Prophylaxis: Heparin (drip) GI Prophylaxis: Antihistamine   Events:   Studies: DG Abd 1 View  Result Date: 12/28/2019 CLINICAL DATA:  Tube placement EXAM: ABDOMEN - 1 VIEW COMPARISON:  None. FINDINGS: The enteric tube projects over the gastric body. There is a large amount of stool in the partially visualized colon. The visualized bowel gas pattern is unremarkable. IMPRESSION: Enteric tube projects over the gastric body. Large stool burden in the partially visualized colon. Electronically Signed   By: Katherine Mantle M.D.   On: 12/28/2019 18:50   CARDIAC CATHETERIZATION  Result Date: 12/28/2019  Prox LAD lesion is 100% stenosed.  Dist LAD lesion is 30% stenosed.  Non-stenotic Ost Ramus to Ramus lesion was previously treated.  Ramus lesion is 30% stenosed.  Mid Cx lesion is 100% stenosed.  Prox Cx lesion is 90% stenosed.  Prox RCA to Mid RCA lesion is 90% stenosed.  Mid RCA lesion is 100% stenosed.  LIMA. No significant disease with insertion into the mid LAD  Prox Graft lesion is 100% stenosed.  Prox Graft lesion is 100% stenosed.  Prox Graft lesion is 100%  stenosed.  Conclusion STEMI presentation with cardiac cath Left heart cath with grafts intervention was deferred Multivessel native coronary disease moderate disease in left main totally occluded ostial LAD proximal circumflex ostial right Totally occluded SVG to RCA circumflex and diagonal LIMA to mid LAD was normal Left ventriculogram showed moderately depressed left ventricular function around 35% globally Unsuccessful minx FemoStop was placed Patient was intubated sedated Case was then transferred to ICU critical care team for management Conservative cardiac input at this point   DG Chest Port 1 View  Result Date: 12/29/2019 CLINICAL DATA:  Acute respiratory failure EXAM: PORTABLE CHEST 1 VIEW COMPARISON:  Radiograph 12/28/2019 FINDINGS: Low positioning of the endotracheal tube which approximates the orifice of the right mainstem bronchus, recommend retraction at least 3 cm to the mid trachea. Transesophageal tube tip below the GE junction, beyond the level of imaging. Telemetry leads overlie the chest. Mild hyperexpansion of the lungs with some central vascular congestion. Increasing prominence of the interstitium with some more hazy opacity in the right lung base. No visible pneumothorax or effusion. 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 No acute osseous or soft tissue abnormality. Degenerative changes are present in the imaged spine and shoulders. IMPRESSION: 1. Low positioning of the endotracheal tube which approximates the orifice of the right mainstem bronchus. Recommend retraction at least 3 cm. 2. Satisfactory positioning of the transesophageal tube. 3. Increasing prominence of  the interstitium with some more hazy opacity in the right lung base, could reflect developing edema or infection. These results will be called to the ordering clinician or representative by the Radiologist Assistant, and communication documented in the PACS or [employer].  Electronically Signed   By: Kreg Shropshire M.D.   On: 12/29/2019 05:50   DG Chest Port 1 View  Result Date: 12/28/2019 CLINICAL DATA:  Dyspnea. EXAM: PORTABLE CHEST 1 VIEW COMPARISON:  12/17/2017 FINDINGS: The endotracheal tube terminates above the carina by approximately 2.6 cm. The enteric tube extends below the left hemidiaphragm. The heart size is mildly enlarged. The patient is status post prior median sternotomy. There is prominence of the pulmonary vasculature. There are atherosclerotic changes of the thoracic aorta. The lungs appear slightly hyperexpanded. There are prominent interstitial lung markings. There is no definite acute osseous abnormality. IMPRESSION: 1. Lines and tubes as above. 2. Findings suggestive of developing interstitial edema. 3.  Aortic Atherosclerosis (ICD10-I70.0). Electronically Signed   By: Katherine Mantle M.D.   On: 12/28/2019 18:50   ECHOCARDIOGRAM COMPLETE  Result Date: 12/29/2019    ECHOCARDIOGRAM REPORT   Patient Name:   Leah Small Date of Exam: 12/29/2019 Medical Rec #:  [id number]       Height:       68.0 in Accession #:    [id number]      Weight:       122.0 lb Date of Birth:  1940-09-15       BSA:          1.657 m Patient Age:    78 years        BP:           111/61 mmHg Patient Gender: F               HR:           74 bpm. Exam Location:  [facility] Procedure: 2D Echo, Color Doppler and Cardiac Doppler Indications:     I21.3 STEMI  History:         Patient has prior history of Echocardiogram examinations. CAD,                  TIA; Risk Factors:Diabetes and HCL.  Sonographer:     Humphrey Rolls RDCS (AE) Referring Phys:  [id number] [facility] D CALLWOOD Diagnosing Phys: Arnoldo Hooker MD  Sonographer Comments: Suboptimal apical window, suboptimal subcostal window and echo performed with patient supine and on artificial respirator. IMPRESSIONS  1. Left ventricular ejection fraction, by estimation, is <20%. The left ventricle has severely decreased function. The left ventricle  demonstrates global hypokinesis. The left ventricular internal cavity size was mildly to moderately dilated. Left ventricular diastolic parameters are consistent with Grade I diastolic dysfunction (impaired relaxation).  2. Right ventricular systolic function is normal. The right ventricular size is normal. There is normal pulmonary artery systolic pressure.  3. Left atrial size was mildly dilated.  4. Right atrial size was mildly dilated.  5. The mitral valve is normal in structure. Mild to moderate mitral valve regurgitation.  6. The aortic valve is normal in structure. Aortic valve regurgitation is trivial. FINDINGS  Left Ventricle: Left ventricular ejection fraction, by estimation, is <20%. The left ventricle has severely decreased function. The left ventricle demonstrates global hypokinesis. The left ventricular internal cavity size was mildly to moderately dilated. There is borderline left ventricular hypertrophy. Left ventricular diastolic parameters are consistent with Grade I diastolic dysfunction (impaired relaxation). Right Ventricle: The right  ventricular size is normal. No increase in right ventricular wall thickness. Right ventricular systolic function is normal. There is normal pulmonary artery systolic pressure. The tricuspid regurgitant velocity is 2.19 m/s, and  with an assumed right atrial pressure of 10 mmHg, the estimated right ventricular systolic pressure is 29.2 mmHg. Left Atrium: Left atrial size was mildly dilated. Right Atrium: Right atrial size was mildly dilated. Pericardium: There is no evidence of pericardial effusion. Mitral Valve: The mitral valve is normal in structure. Mild to moderate mitral valve regurgitation. MV peak gradient, 2.4 mmHg. The mean mitral valve gradient is 1.0 mmHg. Tricuspid Valve: The tricuspid valve is normal in structure. Tricuspid valve regurgitation is mild. Aortic Valve: The aortic valve is normal in structure. Aortic valve regurgitation is trivial. Aortic  valve mean gradient measures 1.0 mmHg. Aortic valve peak gradient measures 2.5 mmHg. Aortic valve area, by VTI measures 1.92 cm. Pulmonic Valve: The pulmonic valve was normal in structure. Pulmonic valve regurgitation is not visualized. Aorta: The aortic root and ascending aorta are structurally normal, with no evidence of dilitation. IAS/Shunts: No atrial level shunt detected by color flow Doppler.  LEFT VENTRICLE PLAX 2D LVIDd:         4.98 cm  Diastology LVIDs:         4.43 cm  LV e' lateral:   5.11 cm/s LV PW:         0.83 cm  LV E/e' lateral: 11.4 LV IVS:        0.79 cm  LV e' medial:    3.05 cm/s LVOT diam:     1.60 cm  LV E/e' medial:  19.1 LV SV:         20 LV SV Index:   12 LVOT Area:     2.01 cm  LEFT ATRIUM         Index LA diam:    3.30 cm 1.99 cm/m  AORTIC VALVE                   PULMONIC VALVE AV Area (Vmax):    1.67 cm    PV Vmax:       0.69 m/s AV Area (Vmean):   1.87 cm    PV Vmean:      47.600 cm/s AV Area (VTI):     1.92 cm    PV VTI:        0.101 m AV Vmax:           79.20 cm/s  PV Peak grad:  1.9 mmHg AV Vmean:          47.000 cm/s PV Mean grad:  1.0 mmHg AV VTI:            0.106 m AV Peak Grad:      2.5 mmHg AV Mean Grad:      1.0 mmHg LVOT Vmax:         65.80 cm/s LVOT Vmean:        43.700 cm/s LVOT VTI:          0.101 m LVOT/AV VTI ratio: 0.95  AORTA Ao Root diam: 2.40 cm MITRAL VALVE               TRICUSPID VALVE MV Area (PHT): 3.91 cm    TR Peak grad:   19.2 mmHg MV Peak grad:  2.4 mmHg    TR Vmax:        219.00 cm/s MV Mean grad:  1.0 mmHg MV Vmax:  0.78 m/s    SHUNTS MV Vmean:      48.7 cm/s   Systemic VTI:  0.10 m MV Decel Time: 194 msec    Systemic Diam: 1.60 cm MV E velocity: 58.20 cm/s MV A velocity: 34.90 cm/s MV E/A ratio:  1.67 Arnoldo HookerBruce Kowalski MD Electronically signed by Arnoldo HookerBruce Kowalski MD Signature Date/Time: 12/29/2019/3:56:35 PM    Final     Consults: Cardiology  Subjective:    Overnight Issues: No overnight issues, remains on the ventilator.  Does awaken on SAT.   Will attempt SBT.  Objective:  Vital signs for last 24 hours: Temp:  [97.8 F (36.6 C)-98.8 F (37.1 C)] 98.8 F (37.1 C) (05/07 1600) Pulse Rate:  [75-102] 87 (05/07 1800) Resp:  [6-29] 23 (05/07 1800) BP: (94-171)/(60-86) 156/70 (05/07 1800) SpO2:  [98 %-100 %] 99 % (05/07 1800) FiO2 (%):  [21 %-50 %] 21 % (05/07 1308)  Hemodynamic parameters for last 24 hours:    Intake/Output from previous day: 05/06 0701 - 05/07 0700 In: 662.6 [I.V.:334.9; IV Piggyback:327.7] Out: 1750 [Urine:1300; Emesis/NG output:450]  Intake/Output this shift: Total I/O In: 26.4 [I.V.:26.4] Out: 350 [Urine:350]  Vent settings for last 24 hours: Vent Mode: Spontaneous FiO2 (%):  [21 %-50 %] 21 % Set Rate:  [8 bmp-20 bmp] 8 bmp Vt Set:  [400 mL] 400 mL PEEP:  [5 cmH20] 5 cmH20 Pressure Support:  [5 cmH20] 5 cmH20 Plateau Pressure:  [17 cmH20] 17 cmH20  Physical Exam:  GENERAL: Frail-appearing woman, intubated mechanically ventilated, lightly sedated but does respond, follows commands. HEAD: Normocephalic, atraumatic.  EYES: Pupils equal, round, reactive to light.  No scleral icterus.  MOUTH: Orotracheally intubated, OG in place. NECK: Supple. No thyromegaly.  Trachea midline. No JVD.  PULMONARY: Limited exam due to PPE/CAPR synchronous with vent. CARDIOVASCULAR: Monitor shows sinus rhythm  GASTROINTESTINAL: Nondistended abdomen, soft.  Nontender. MUSCULOSKELETAL: No joint deformity, no clubbing, no edema.  NEUROLOGIC: No focal deficits. SKIN: Intact,warm,dry. PSYCH: Cannot assess, patient intubated  Assessment/Plan:   Acute hypoxic respiratory failure, apneic episode during cardiac cath On ventilator support Status post STEMI in setting of chronic anginal symptoms Continue ventilator support Able to follow commands during SAT Initiate SBT as tolerated FiO2 requirements low Hopefully extubate today  Coronary artery disease Chronic angina Ischemic cardiomyopathy EF <20% Status post  STEMI Significant reduction in EF from prior Cardiology following, appreciate input Continue heparin infusion Discontinue IV steroids Diuretics as needed/tolerated Resume Ranexa and isosorbide when able Continue Plavix  Diabetes mellitus type 2 Sliding scale insulin Discontinue steroids  COVID-19 infection No overt pneumonitis on chest x-ray Oxygen requirements low Continue remdesivir Will withhold steroids due to STEMI, poor glycemic control  Seizure disorder Continue Keppra Continue Lamictal   Pressure Injury 12/28/19 Buttocks Left Stage 3 -  Full thickness tissue loss. Subcutaneous fat may be visible but bone, tendon or muscle are NOT exposed. (Active)  12/28/19 1800  Location: Buttocks  Location Orientation: Left  Staging: Stage 3 -  Full thickness tissue loss. Subcutaneous fat may be visible but bone, tendon or muscle are NOT exposed.  Wound Description (Comments):   Present on Admission: Yes      LOS: 1 day   Additional comments: Discussed during multidisciplinary rounds, discussed plan for weaning/extubation with RT.  Critical Care Total Time*: 40 Minutes  C. Danice GoltzLaura Krissia Schreier, MD [facility] 12/29/2019  *Care during the described time interval was provided by me and/or other providers on the critical care team.  I have reviewed this patient's  available data, including medical history, events of note, physical examination and test results as part of my evaluation.   **This note was dictated using voice recognition software/Dragon.  Despite best efforts to proofread, errors can occur which can change the meaning.  Any change was purely unintentional.

## 2019-12-29 NOTE — Consult Note (Signed)
ANTICOAGULATION CONSULT NOTE - Follow Up Consult  Pharmacy Consult for Heparin  Indication: ACS / STEMI  Allergies  Allergen Reactions  . Talwin [Pentazocine] Anaphylaxis and Swelling    Throat swelling  . Valium [Diazepam] Other (See Comments)    Makes her feel like she's flying  . Vicodin [Hydrocodone-Acetaminophen] Nausea And Vomiting  . Adhesive [Tape] Other (See Comments)    Bruising and TEARING!!!  Please use "paper" tape  . Beta Adrenergic Blockers Other (See Comments)    Pt states she not allergic  . Levofloxacin Swelling and Other (See Comments)    Tongue swells   . Simvastatin Swelling and Rash    Mouth swelling    Patient Measurements:   Heparin Dosing Weight: 55.3 kg   Vital Signs: Temp: 97.8 F (36.6 C) (05/07 0000) Temp Source: Oral (05/07 0000) BP: 122/63 (05/07 0000) Pulse Rate: 76 (05/07 0000)  Labs: Recent Labs    12/28/19 1601 12/28/19 1822 12/28/19 2321 12/29/19 0106  HGB 8.2*  --   --  8.0*  HCT 26.5*  --   --  25.5*  PLT 310  --   --  224  APTT >160*  --   --   --   LABPROT 14.7  --   --   --   INR 1.2  --   --   --   HEPARINUNFRC  --   --   --  0.29*  CREATININE 0.91  --   --  0.94  TROPONINIHS 13 587* 2,712*  --     CrCl cannot be calculated (Unknown ideal weight.).   Medications:  Per chart review, no prior anticoagulants at home.   Assessment: Pharmacy has been consulted for heparin dosing in patient who presented with chest pain that radiated to her left arm. Baseline aPTT, INR, and CBC have been ordered. Patient is in the cath lab at this time.    Goal of Therapy:  Heparin level 0.3-0.7 units/ml Monitor platelets by anticoagulation protocol: Yes   Plan:  05/07 @ 0100 HL 0.29 subtherapeutic. Will increase rate to 800 units/hr and will recheck HL at 1000 and continue to monitor, CBC low stable will continue to monitor.  Thomasene Ripple, PharmD, BCPS Clinical Pharmacist 12/29/2019,1:57 AM

## 2019-12-29 NOTE — Consult Note (Signed)
WOC Nurse Consult Note: Reason for Consult: Consult requested for left buttock.  Pt is critically ill and on isolation for Covid. Wound type: stage 3 pressure injury Pressure Injury POA: Yes Measurement: 1X1X.2cm to left upper buttock, red and moist Dressing procedure/placement/frequency: Topical treatment orders provided for staff nurses to perform to protect and promote healing as follows: Foam dressing to left buttock, change Q 3 days or PRN soiling. Please re-consult if further assistance is needed.  Thank-you,  Cammie Mcgee MSN, RN, CWOCN, [place], CNS [phone]

## 2019-12-29 NOTE — Progress Notes (Signed)
Pt ETT was secured 23cm at lip. NP called and placed order to pull ETT back 3cm. ETT now secured 20cm at the lip. Pt tol well.

## 2019-12-29 NOTE — Consult Note (Signed)
ANTICOAGULATION CONSULT NOTE  Pharmacy Consult for Heparin  Indication: ACS / STEMI  Patient Measurements:   Heparin Dosing Weight: 55.3 kg   Vital Signs: Temp: 98.8 F (37.1 C) (05/07 0500) Temp Source: Oral (05/07 0500) BP: 121/67 (05/07 0700) Pulse Rate: 79 (05/07 0700)  Labs: Recent Labs    12/28/19 1601 12/28/19 1822 12/28/19 2321 12/29/19 0106  HGB 8.2*  --   --  8.0*  HCT 26.5*  --   --  25.5*  PLT 310  --   --  224  APTT >160*  --   --   --   LABPROT 14.7  --   --   --   INR 1.2  --   --   --   HEPARINUNFRC  --   --   --  0.29*  CREATININE 0.91  --   --  0.94  TROPONINIHS 13 587* 2,712*  --     Medications:  Per chart review, no prior anticoagulants at home.   Assessment: Pharmacy has been consulted for heparin dosing in patient who presented with chest pain that radiated to her left arm. The patient had respiratory failure on 5/6 during the cardiac cath procedure and was transferred to CCU. She is currently intubated. Currently H&H is low but stable and platelets are WNL  Heparin Course: 5/6 initiation: 3300 unit bolus then 650 units/hr 5/7 0106 HL 0.29: inc to 800 units/hr 5/7 1009 HL 0.67: dec to 750 units/hr  Goal of Therapy:  Heparin level 0.3-0.7 units/ml Monitor platelets by anticoagulation protocol: Yes   Plan:   Heparin Level is therapeutic but borderline maximum allowable value: decrease heparin rate slightly to 750 units/hr  recheck heparin level 8 hours after rate change  Next CBC in am  Burnis Medin, PharmD, BCPS Clinical Pharmacist 12/29/2019,7:06 AM

## 2019-12-30 ENCOUNTER — Other Ambulatory Visit: Payer: Self-pay

## 2019-12-30 DIAGNOSIS — L8990 Pressure ulcer of unspecified site, unspecified stage: Secondary | ICD-10-CM | POA: Diagnosis present

## 2019-12-30 LAB — GLUCOSE, CAPILLARY
Glucose-Capillary: 130 mg/dL — ABNORMAL HIGH (ref 70–99)
Glucose-Capillary: 149 mg/dL — ABNORMAL HIGH (ref 70–99)
Glucose-Capillary: 158 mg/dL — ABNORMAL HIGH (ref 70–99)
Glucose-Capillary: 226 mg/dL — ABNORMAL HIGH (ref 70–99)
Glucose-Capillary: 269 mg/dL — ABNORMAL HIGH (ref 70–99)
Glucose-Capillary: 272 mg/dL — ABNORMAL HIGH (ref 70–99)
Glucose-Capillary: 299 mg/dL — ABNORMAL HIGH (ref 70–99)
Glucose-Capillary: 62 mg/dL — ABNORMAL LOW (ref 70–99)
Glucose-Capillary: 70 mg/dL (ref 70–99)

## 2019-12-30 LAB — COMPREHENSIVE METABOLIC PANEL
ALT: 35 U/L (ref 0–44)
AST: 44 U/L — ABNORMAL HIGH (ref 15–41)
Albumin: 3.7 g/dL (ref 3.5–5.0)
Alkaline Phosphatase: 64 U/L (ref 38–126)
Anion gap: 13 (ref 5–15)
BUN: 26 mg/dL — ABNORMAL HIGH (ref 8–23)
CO2: 19 mmol/L — ABNORMAL LOW (ref 22–32)
Calcium: 8.5 mg/dL — ABNORMAL LOW (ref 8.9–10.3)
Chloride: 103 mmol/L (ref 98–111)
Creatinine, Ser: 0.82 mg/dL (ref 0.44–1.00)
GFR calc Af Amer: >60 mL/min (ref >60)
GFR calc non Af Amer: >60 mL/min (ref >60)
Glucose, Bld: 144 mg/dL — ABNORMAL HIGH (ref 70–99)
Potassium: 3.7 mmol/L (ref 3.5–5.1)
Sodium: 135 mmol/L (ref 135–145)
Total Bilirubin: 0.7 mg/dL (ref 0.3–1.2)
Total Protein: 7.4 g/dL (ref 6.5–8.1)

## 2019-12-30 LAB — CBC WITH DIFFERENTIAL/PLATELET
Abs Immature Granulocytes: 0.09 10*3/uL — ABNORMAL HIGH (ref 0.00–0.07)
Basophils Absolute: 0 10*3/uL (ref 0.0–0.1)
Basophils Relative: 0 %
Eosinophils Absolute: 0 10*3/uL (ref 0.0–0.5)
Eosinophils Relative: 0 %
HCT: 29 % — ABNORMAL LOW (ref 36.0–46.0)
Hemoglobin: 9.2 g/dL — ABNORMAL LOW (ref 12.0–15.0)
Immature Granulocytes: 1 %
Lymphocytes Relative: 7 %
Lymphs Abs: 0.9 10*3/uL (ref 0.7–4.0)
MCH: 30.4 pg (ref 26.0–34.0)
MCHC: 31.7 g/dL (ref 30.0–36.0)
MCV: 95.7 fL (ref 80.0–100.0)
Monocytes Absolute: 0.7 10*3/uL (ref 0.1–1.0)
Monocytes Relative: 5 %
Neutro Abs: 10.5 10*3/uL — ABNORMAL HIGH (ref 1.7–7.7)
Neutrophils Relative %: 87 %
Platelets: 236 10*3/uL (ref 150–400)
RBC: 3.03 MIL/uL — ABNORMAL LOW (ref 3.87–5.11)
RDW: 18 % — ABNORMAL HIGH (ref 11.5–15.5)
WBC: 12.2 10*3/uL — ABNORMAL HIGH (ref 4.0–10.5)
nRBC: 0 % (ref 0.0–0.2)

## 2019-12-30 LAB — HEPARIN LEVEL (UNFRACTIONATED)
Heparin Unfractionated: 0.43 IU/mL (ref 0.30–0.70)
Heparin Unfractionated: 0.71 IU/mL — ABNORMAL HIGH (ref 0.30–0.70)
Heparin Unfractionated: 0.64 IU/mL (ref 0.30–0.70)

## 2019-12-30 LAB — C-REACTIVE PROTEIN: CRP: 7.8 mg/dL — ABNORMAL HIGH (ref <1.0)

## 2019-12-30 LAB — FIBRIN DERIVATIVES D-DIMER (ARMC ONLY): Fibrin derivatives D-dimer (ARMC): 682.92 ng/mL (FEU) — ABNORMAL HIGH (ref 0.00–499.00)

## 2019-12-30 LAB — PHOSPHORUS: Phosphorus: 5 mg/dL — ABNORMAL HIGH (ref 2.5–4.6)

## 2019-12-30 LAB — MAGNESIUM: Magnesium: 2 mg/dL (ref 1.7–2.4)

## 2019-12-30 LAB — TROPONIN I (HIGH SENSITIVITY)
Troponin I (High Sensitivity): 682 ng/L (ref <18)
Troponin I (High Sensitivity): 763 ng/L (ref <18)

## 2019-12-30 LAB — FIBRINOGEN: Fibrinogen: 459 mg/dL (ref 210–475)

## 2019-12-30 MED ORDER — NITROGLYCERIN 0.4 MG SL SUBL
0.4000 mg | SUBLINGUAL_TABLET | SUBLINGUAL | Status: DC | PRN
  Administered 2019-12-30 – 2020-01-03 (×9): 0.4 mg via SUBLINGUAL
  Filled 2019-12-30 (×7): qty 1

## 2019-12-30 MED ORDER — DEXTROSE 50 % IV SOLN: 25.0000 mL | Freq: Once | INTRAVENOUS | Status: AC

## 2019-12-30 MED ORDER — NITROGLYCERIN 0.4 MG SL SUBL
SUBLINGUAL_TABLET | SUBLINGUAL | Status: AC
  Filled 2019-12-30: qty 3

## 2019-12-30 MED ORDER — POTASSIUM CHLORIDE 20 MEQ/15ML (10%) PO SOLN
20.0000 meq | Freq: Once | ORAL | Status: AC
  Administered 2019-12-30: 20 meq
  Filled 2019-12-30: qty 15

## 2019-12-30 MED ORDER — SODIUM CHLORIDE 0.9 % IV BOLUS
250.0000 mL | Freq: Once | INTRAVENOUS | Status: AC
  Administered 2019-12-30 – 2019-12-31 (×2): 250 mL via INTRAVENOUS

## 2019-12-30 MED ORDER — DEXTROSE 50 % IV SOLN
INTRAVENOUS | Status: AC
  Administered 2019-12-30: 25 mL via INTRAVENOUS
  Filled 2019-12-30: qty 50

## 2019-12-30 MED ORDER — DOXYCYCLINE HYCLATE 100 MG PO TABS
100.0000 mg | ORAL_TABLET | Freq: Two times a day (BID) | ORAL | Status: AC
  Administered 2019-12-30 – 2020-01-02 (×7): 100 mg via ORAL
  Filled 2019-12-30 (×8): qty 1

## 2019-12-30 MED ORDER — LEVETIRACETAM IN NACL 1500 MG/100ML IV SOLN
1500.0000 mg | INTRAVENOUS | Status: DC
  Administered 2019-12-30 – 2020-01-03 (×5): 1500 mg via INTRAVENOUS
  Filled 2019-12-30 (×7): qty 100

## 2019-12-30 NOTE — Progress Notes (Signed)
[facility] Encounter Note  Patient: Leah Small / Admit Date: 12/28/2019 / Date of Encounter: 12/30/2019, 7:35 AM   Subjective: Patient continues to be critical with acute cardiorespiratory failure. Patient is Covid positive with likely inflammatory abnormalities by chest x-ray with possible mild amount of edema. Oxygen saturation currently good. Urine output significantly improved over the last 2 days. Troponin elevation consistent with myocardial infarction at 2712. Patient also remains anemic without evidence of significant concerns of overt bleeding. Echocardiogram showing severe LV systolic dysfunction with ejection fraction of 15 to 20% after cardiac catheterization showing diffuse coronary artery disease with only a LIMA to the LAD patent which is unchanged from cardiac catheterization in 2017 with no amenable intervention  Review of Systems: Cannot assess   objective: Telemetry: Sinus tachycardia Physical Exam: Blood pressure (!) 155/70, pulse 75, temperature 98 F (36.7 C), temperature source Oral, resp. rate (!) 0, SpO2 100 %. There is no height or weight on file to calculate BMI.   As per medicine  Intake/Output Summary (Last 24 hours) at 12/30/2019 0735 Last data filed at 12/30/2019 0700 Gross per 24 hour  Intake 587.08 ml  Output 1380 ml  Net -792.92 ml    Inpatient Medications:  . vitamin C  500 mg Per Tube Daily  . aspirin  81 mg Oral Daily  . brimonidine  1 drop Both Eyes BID  . brinzolamide  1 drop Both Eyes BID  . chlorhexidine gluconate (MEDLINE KIT)  15 mL Mouth Rinse BID  . Chlorhexidine Gluconate Cloth  6 each Topical Daily  . docusate  100 mg Oral BID  . insulin aspart  0-20 Units Subcutaneous Q4H  . lamoTRIgine  100 mg Per Tube Daily  . mouth rinse  15 mL Mouth Rinse 10 times per day  . polyethylene glycol  17 g Oral Daily  . potassium chloride  20 mEq Per Tube Once  . sodium chloride flush  3 mL Intravenous Q12H  . timolol  1 drop  Both Eyes BID  . zinc sulfate  220 mg Per Tube Daily   Infusions:  . sodium chloride 10 mL/hr at 12/28/19 2000  . sodium chloride 10 mL/hr at 12/29/19 0600  . famotidine (PEPCID) IV Stopped (12/29/19 2257)  . heparin 750 Units/hr (12/30/19 0700)  . levETIRAcetam Stopped (12/30/19 0107)  . remdesivir 100 mg in NS 100 mL Stopped (12/29/19 0947)    Labs: Recent Labs    12/28/19 1601 12/29/19 0106 12/29/19 1009  NA 134* 133*  --   K 5.1 3.4*  --   CL 105 100  --   CO2 18* 19*  --   GLUCOSE 256* 228*  --   BUN 23 25*  --   CREATININE 0.91 0.94  --   CALCIUM 9.1 8.9  --   MG  --   --  1.4*   Recent Labs    12/28/19 1601 12/29/19 0106  AST 29 48*  ALT 23 31  ALKPHOS 73 73  BILITOT 0.9 0.9  PROT 7.9 7.8  ALBUMIN 4.4 4.0   Recent Labs    12/28/19 1601 12/29/19 0106  WBC 12.1* 9.3  NEUTROABS 10.1* 8.4*  HGB 8.2* 8.0*  HCT 26.5* 25.5*  MCV 98.5 95.9  PLT 310 224   No results for input(s): CKTOTAL, CKMB, TROPONINI in the last 72 hours. Invalid input(s): POCBNP Recent Labs    12/28/19 1601  HGBA1C 6.1*     Weights: There were no vitals filed for  this visit.   Radiology/Studies:  DG Abd 1 View  Result Date: 12/28/2019 CLINICAL DATA:  Tube placement EXAM: ABDOMEN - 1 VIEW COMPARISON:  None. FINDINGS: The enteric tube projects over the gastric body. There is a large amount of stool in the partially visualized colon. The visualized bowel gas pattern is unremarkable. IMPRESSION: Enteric tube projects over the gastric body. Large stool burden in the partially visualized colon. Electronically Signed   By: Constance Holster M.D.   On: 12/28/2019 18:50   CARDIAC CATHETERIZATION  Result Date: 12/28/2019  Prox LAD lesion is 100% stenosed.  Dist LAD lesion is 30% stenosed.  Non-stenotic Ost Ramus to Ramus lesion was previously treated.  Ramus lesion is 30% stenosed.  Mid Cx lesion is 100% stenosed.  Prox Cx lesion is 90% stenosed.  Prox RCA to Mid RCA lesion is 90%  stenosed.  Mid RCA lesion is 100% stenosed.  LIMA. No significant disease with insertion into the mid LAD  Prox Graft lesion is 100% stenosed.  Prox Graft lesion is 100% stenosed.  Prox Graft lesion is 100% stenosed.  Conclusion STEMI presentation with cardiac cath Left heart cath with grafts intervention was deferred Multivessel native coronary disease moderate disease in left main totally occluded ostial LAD proximal circumflex ostial right Totally occluded SVG to RCA circumflex and diagonal LIMA to mid LAD was normal Left ventriculogram showed moderately depressed left ventricular function around 35% globally Unsuccessful minx FemoStop was placed Patient was intubated sedated Case was then transferred to ICU critical care team for management Conservative cardiac input at this point   DG Chest Port 1 View  Result Date: 12/29/2019 CLINICAL DATA:  Acute respiratory failure EXAM: PORTABLE CHEST 1 VIEW COMPARISON:  Radiograph 12/28/2019 FINDINGS: Low positioning of the endotracheal tube which approximates the orifice of the right mainstem bronchus, recommend retraction at least 3 cm to the mid trachea. Transesophageal tube tip below the GE junction, beyond the level of imaging. Telemetry leads overlie the chest. Mild hyperexpansion of the lungs with some central vascular congestion. Increasing prominence of the interstitium with some more hazy opacity in the right lung base. No visible pneumothorax or effusion. 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 No acute osseous or soft tissue abnormality. Degenerative changes are present in the imaged spine and shoulders. IMPRESSION: 1. Low positioning of the endotracheal tube which approximates the orifice of the right mainstem bronchus. Recommend retraction at least 3 cm. 2. Satisfactory positioning of the transesophageal tube. 3. Increasing prominence of the interstitium with some more hazy opacity in the right lung base,  could reflect developing edema or infection. These results will be called to the ordering clinician or representative by the Radiologist Assistant, and communication documented in the PACS or [employer]. Electronically Signed   By: Lovena Le M.D.   On: 12/29/2019 05:50   DG Chest Port 1 View  Result Date: 12/28/2019 CLINICAL DATA:  Dyspnea. EXAM: PORTABLE CHEST 1 VIEW COMPARISON:  12/17/2017 FINDINGS: The endotracheal tube terminates above the carina by approximately 2.6 cm. The enteric tube extends below the left hemidiaphragm. The heart size is mildly enlarged. The patient is status post prior median sternotomy. There is prominence of the pulmonary vasculature. There are atherosclerotic changes of the thoracic aorta. The lungs appear slightly hyperexpanded. There are prominent interstitial lung markings. There is no definite acute osseous abnormality. IMPRESSION: 1. Lines and tubes as above. 2. Findings suggestive of developing interstitial edema. 3.  Aortic Atherosclerosis (ICD10-I70.0). Electronically Signed  By: Constance Holster M.D.   On: 12/28/2019 18:50   ECHOCARDIOGRAM COMPLETE  Result Date: 12/29/2019    ECHOCARDIOGRAM REPORT   Patient Name:   Leah Small Date of Exam: 12/29/2019 Medical Rec #:  [id number]       Height:       68.0 in Accession #:    [id number]      Weight:       122.0 lb Date of Birth:  December 14, 1940       BSA:          1.657 m Patient Age:    79 years        BP:           111/61 mmHg Patient Gender: F               HR:           74 bpm. Exam Location:  [facility] Procedure: 2D Echo, Color Doppler and Cardiac Doppler Indications:     I21.3 STEMI  History:         Patient has prior history of Echocardiogram examinations. CAD,                  TIA; Risk Factors:Diabetes and HCL.  Sonographer:     Charmayne Sheer RDCS (AE) Referring Phys:  [id number] [facility] D CALLWOOD Diagnosing Phys: Serafina Royals MD  Sonographer Comments: Suboptimal apical window, suboptimal subcostal window and echo  performed with patient supine and on artificial respirator. IMPRESSIONS  1. Left ventricular ejection fraction, by estimation, is <20%. The left ventricle has severely decreased function. The left ventricle demonstrates global hypokinesis. The left ventricular internal cavity size was mildly to moderately dilated. Left ventricular diastolic parameters are consistent with Grade I diastolic dysfunction (impaired relaxation).  2. Right ventricular systolic function is normal. The right ventricular size is normal. There is normal pulmonary artery systolic pressure.  3. Left atrial size was mildly dilated.  4. Right atrial size was mildly dilated.  5. The mitral valve is normal in structure. Mild to moderate mitral valve regurgitation.  6. The aortic valve is normal in structure. Aortic valve regurgitation is trivial. FINDINGS  Left Ventricle: Left ventricular ejection fraction, by estimation, is <20%. The left ventricle has severely decreased function. The left ventricle demonstrates global hypokinesis. The left ventricular internal cavity size was mildly to moderately dilated. There is borderline left ventricular hypertrophy. Left ventricular diastolic parameters are consistent with Grade I diastolic dysfunction (impaired relaxation). Right Ventricle: The right ventricular size is normal. No increase in right ventricular wall thickness. Right ventricular systolic function is normal. There is normal pulmonary artery systolic pressure. The tricuspid regurgitant velocity is 2.19 m/s, and  with an assumed right atrial pressure of 10 mmHg, the estimated right ventricular systolic pressure is 31.5 mmHg. Left Atrium: Left atrial size was mildly dilated. Right Atrium: Right atrial size was mildly dilated. Pericardium: There is no evidence of pericardial effusion. Mitral Valve: The mitral valve is normal in structure. Mild to moderate mitral valve regurgitation. MV peak gradient, 2.4 mmHg. The mean mitral valve gradient is 1.0  mmHg. Tricuspid Valve: The tricuspid valve is normal in structure. Tricuspid valve regurgitation is mild. Aortic Valve: The aortic valve is normal in structure. Aortic valve regurgitation is trivial. Aortic valve mean gradient measures 1.0 mmHg. Aortic valve peak gradient measures 2.5 mmHg. Aortic valve area, by VTI measures 1.92 cm. Pulmonic Valve: The pulmonic valve was normal in structure. Pulmonic valve regurgitation is not visualized. Aorta:  The aortic root and ascending aorta are structurally normal, with no evidence of dilitation. IAS/Shunts: No atrial level shunt detected by color flow Doppler.  LEFT VENTRICLE PLAX 2D LVIDd:         4.98 cm  Diastology LVIDs:         4.43 cm  LV e' lateral:   5.11 cm/s LV PW:         0.83 cm  LV E/e' lateral: 11.4 LV IVS:        0.79 cm  LV e' medial:    3.05 cm/s LVOT diam:     1.60 cm  LV E/e' medial:  19.1 LV SV:         20 LV SV Index:   12 LVOT Area:     2.01 cm  LEFT ATRIUM         Index LA diam:    3.30 cm 1.99 cm/m  AORTIC VALVE                   PULMONIC VALVE AV Area (Vmax):    1.67 cm    PV Vmax:       0.69 m/s AV Area (Vmean):   1.87 cm    PV Vmean:      47.600 cm/s AV Area (VTI):     1.92 cm    PV VTI:        0.101 m AV Vmax:           79.20 cm/s  PV Peak grad:  1.9 mmHg AV Vmean:          47.000 cm/s PV Mean grad:  1.0 mmHg AV VTI:            0.106 m AV Peak Grad:      2.5 mmHg AV Mean Grad:      1.0 mmHg LVOT Vmax:         65.80 cm/s LVOT Vmean:        43.700 cm/s LVOT VTI:          0.101 m LVOT/AV VTI ratio: 0.95  AORTA Ao Root diam: 2.40 cm MITRAL VALVE               TRICUSPID VALVE MV Area (PHT): 3.91 cm    TR Peak grad:   19.2 mmHg MV Peak grad:  2.4 mmHg    TR Vmax:        219.00 cm/s MV Mean grad:  1.0 mmHg MV Vmax:       0.78 m/s    SHUNTS MV Vmean:      48.7 cm/s   Systemic VTI:  0.10 m MV Decel Time: 194 msec    Systemic Diam: 1.60 cm MV E velocity: 58.20 cm/s MV A velocity: 34.90 cm/s MV E/A ratio:  1.67 Serafina Royals MD Electronically signed  by Serafina Royals MD Signature Date/Time: 12/29/2019/3:56:35 PM    Final      Assessment and Recommendation  79 y.o. female with known three-vessel coronary artery disease hypertension hyperlipidemia with chronic anginal symptoms having acute cardio respiratory failure multifactorial in nature including acute myocardial infarction with Covid infection possibly causing worsening inflammatory lung disease with no coronary artery disease amenable to intervention at this time and severe LV systolic dysfunction due to above 1.  Continue supportive care for respiratory failure and infection with current evidence of good oxygenation 2.  No further cardiac intervention and/or diagnostics necessary at this time due to no evidence of amenable PCI and/or  stent placement 3.  Continue heparin for further risk reduction of deep venous thrombosis as well as myocardial infarction treatment but will discontinue Plavix due to concerns of anemia and possible bleeding complications exacerbating above 4. Further consideration of Lasix depending on chest x-ray today but urine output appears to be relatively stable with no acute kidney failure 5. Further treatment options after above with overall poor long-term prognosis Signed, Serafina Royals M.D. FACC

## 2019-12-30 NOTE — Consult Note (Signed)
ANTICOAGULATION CONSULT NOTE  Pharmacy Consult for Heparin  Indication: ACS / STEMI  Patient Measurements:   Heparin Dosing Weight: 55.3 kg   Vital Signs: Temp: 97.4 F (36.3 C) (05/07 2000) Temp Source: Oral (05/07 2000) BP: 105/52 (05/07 2300) Pulse Rate: 72 (05/07 2300)  Labs: Recent Labs    12/28/19 1601 12/28/19 1822 12/28/19 2321 12/29/19 0106 12/29/19 1009 12/29/19 2329  HGB 8.2*  --   --  8.0*  --   --   HCT 26.5*  --   --  25.5*  --   --   PLT 310  --   --  224  --   --   APTT >160*  --   --   --   --   --   LABPROT 14.7  --   --   --   --   --   INR 1.2  --   --   --   --   --   HEPARINUNFRC  --   --   --  0.29* 0.67 0.43  CREATININE 0.91  --   --  0.94  --   --   TROPONINIHS 13 587* 2,712*  --   --   --     Medications:  Per chart review, no prior anticoagulants at home.   Assessment: Pharmacy has been consulted for heparin dosing in patient who presented with chest pain that radiated to her left arm. The patient had respiratory failure on 5/6 during the cardiac cath procedure and was transferred to CCU. She is currently intubated. Currently H&H is low but stable and platelets are WNL  Heparin Course: 5/6 initiation: 3300 unit bolus then 650 units/hr 5/7 0106 HL 0.29: inc to 800 units/hr 5/7 1009 HL 0.67: dec to 750 units/hr  Goal of Therapy:  Heparin level 0.3-0.7 units/ml Monitor platelets by anticoagulation protocol: Yes   Plan:  05/07 @ 2330 HL 0.43 therapeutic. Will continue current rate and will recheck HL w/ am labs, CBC check w/ am labs.  Thomasene Ripple, PharmD, BCPS Clinical Pharmacist 12/30/2019,12:27 AM

## 2019-12-30 NOTE — Evaluation (Signed)
Clinical/Bedside Swallow Evaluation Patient Details  Name: Leah Small MRN: [id number] Date of Birth: [birth year]  Today's Date: 12/30/2019 Time: SLP Start Time (ACUTE ONLY): 0915 SLP Stop Time (ACUTE ONLY): 1015 SLP Time Calculation (min) (ACUTE ONLY): 60 min  Past Medical History:  Past Medical History:  Diagnosis Date  . Acid reflux   . Arthritis    "all over"  . Chronic stable angina (HCC)   . Coronary artery disease   . Hypercholesterolemia   . Hypertension   . Myocardial infarction (HCC) 1991; 07/06/2015  . NSTEMI (non-ST elevated myocardial infarction) (HCC) 10/22/2015  . Psoriatic arthritis (HCC)   . 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 TIA (transient ischemic attack)    "several over the last 20 years" (10/22/2015)  . Type II diabetes mellitus (HCC)    Past Surgical History:  Past Surgical History:  Procedure Laterality Date  . ABDOMINAL HYSTERECTOMY  1973  . APPENDECTOMY  1950s   elementary school  . CARDIAC CATHETERIZATION  X 2-3  . CARDIAC CATHETERIZATION N/A 11/21/2015   Procedure: Left Heart Cath and Coronary Angiography;  Surgeon: Corky Crafts, MD;  Location: [facility] INVASIVE CV LAB;  Service: Cardiovascular;  Laterality: N/A;  . CARDIAC CATHETERIZATION  11/21/2015   Procedure: Coronary Stent Intervention;  Surgeon: Corky Crafts, MD;  Location: [facility] INVASIVE CV LAB;  Service: Cardiovascular;;  . CARDIAC CATHETERIZATION N/A 01/29/2016   Procedure: Left Heart Cath and Cors/Grafts Angiography;  Surgeon: Tonny Bollman, MD;  Location: [facility] INVASIVE CV LAB;  Service: Cardiovascular;  Laterality: N/A;  . CARDIAC CATHETERIZATION N/A 07/22/2016   Procedure: Left Heart Cath and Coronary Angiography;  Surgeon: Iran Ouch, MD;  Location: [facility] INVASIVE CV LAB;  Service: Cardiovascular;  Laterality: N/A;  . CAROTID STENT INSERTION Bilateral 2011-2012   right-left  . CATARACT EXTRACTION W/ INTRAOCULAR  LENS  IMPLANT, BILATERAL Bilateral 2009-2010  . CHOLECYSTECTOMY OPEN  ~ 2014  . CORONARY ANGIOPLASTY    . CORONARY ANGIOPLASTY WITH STENT PLACEMENT  11/21/2015  . CORONARY ARTERY BYPASS GRAFT  08/1995  . CORONARY/GRAFT ACUTE MI REVASCULARIZATION N/A 12/28/2019   Procedure: Coronary/Graft Acute MI Revascularization;  Surgeon: Alwyn Pea, MD;  Location: [facility] INVASIVE CV LAB;  Service: Cardiovascular;  Laterality: N/A;  . DILATION AND CURETTAGE OF UTERUS  X 1  . LEFT HEART CATH AND CORONARY ANGIOGRAPHY N/A 12/28/2019   Procedure: LEFT HEART CATH AND CORONARY ANGIOGRAPHY;  Surgeon: Alwyn Pea, MD;  Location: [facility] INVASIVE CV LAB;  Service: Cardiovascular;  Laterality: N/A;  . 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 TONSILLECTOMY  ~ 1947   2nd grade   HPI:  Pt is a 79 y.o. female with a history of Multiple medical issues including GERD, Memory deficits, arthritis, chronic stable angina, coronary disease, hyperlipidemia, hypertension, MI, seizure, TIAs who presents to the ED for central chest pain that radiated to the left arm beginning at 11:30 AM. Patient with 10 out of 10 chest pain on arrival. Pt was taken to the Cath Lab with subsequent deterioration and oral intubation; extubated on 12/30/2019. Pt has Baseline Memory Deficits per chart.    Assessment / Plan / Recommendation Clinical Impression  Pt appears to present w/ Mild+ oropharyngeal phase dysphagia w/ delayed overt, clinical s/s of aspiration noted post po trials of thin liquids given despite aspiration precautions in place. Unsure if any impact from recent oral  intubation/extubation increasing pt's airway irritation during swallowing. No decline in vocal quality or O2 sats were noted post trials of thin liquids, just the delayed, mild cough. Pt consumed po trials of Nectar liquids via cup/straw and purees/softened solids w/ No overt, clinical s/s of aspiration during/post po  trials. Oral phase appeared [facility] w/ timely bolus management and control of bolus propulsion for A-P transfer for swallowing. Oral clearing achieved w/ all trial consistencies. Min increased mastication time noted w/ increased texture - pt is missing Dentition/molars. OM Exam appeared [facility] w/ no unilateral weakness noted. Min bruised area along R lateral tongue noted. Speech Clear. Pt fed self w/ setup support. Recommend a mech soft consistency diet w/ Minced meats, moistened foods; Nectar liquidsvia cup/straw; recommend general aspiration precautions, Pills WHOLE in Puree for safer, easier swallowing. Support at meals as needed. ST services will f/u w/ ongoing assessment of diet; trials to upgrade. MD/NSG agreed SLP Visit Diagnosis: Dysphagia, pharyngeal phase (R13.13)    Aspiration Risk  Mild aspiration risk;Risk for inadequate nutrition/hydration    Diet Recommendation  Dysphagia level 3 (Minced meats w/ gravies), Nectar consistency liquids; general aspiration and Reflux precautions; support at meals  Medication Administration: Whole meds with puree(for safer swallowing)    Other  Recommendations Recommended Consults: (Dietician f/u) Oral Care Recommendations: Oral care BID;Oral care before and after PO;Staff/trained caregiver to provide oral care Other Recommendations: Order thickener from pharmacy;Prohibited food (jello, ice cream, thin soups);Remove water pitcher;Have oral suction available   Follow up Recommendations (TBD)      Frequency and Duration min 3x week  2 weeks       Prognosis Prognosis for Safe Diet Advancement: Good Barriers to Reach Goals: Cognitive deficits;Time post onset;Severity of deficits      Swallow Study   General Date of Onset: 12/28/19 HPI: Pt is a 79 y.o. female with a history of Multiple medical issues including GERD, Memory deficits, arthritis, chronic stable angina, coronary disease, hyperlipidemia, hypertension, MI, seizure, TIAs who presents to the ED  for central chest pain that radiated to the left arm beginning at 11:30 AM. Patient with 10 out of 10 chest pain on arrival. Pt was taken to the Cath Lab with subsequent deterioration and oral intubation; extubated on 12/30/2019. Pt has Baseline Memory Deficits per chart.  Type of Study: Bedside Swallow Evaluation Previous Swallow Assessment: none  Diet Prior to this Study: NPO(regular diet at home) Temperature Spikes Noted: No(wbc 12.2) Respiratory Status: Nasal cannula(2L) History of Recent Intubation: Yes Length of Intubations (days): 2 days Date extubated: 12/29/19 Behavior/Cognition: Alert;Cooperative;Pleasant mood;Distractible;Requires cueing(baseline Memory deficits) Oral Cavity Assessment: Dry;Erythema(right side of tongue) Oral Care Completed by SLP: Yes Oral Cavity - Dentition: Adequate natural dentition;Missing dentition Vision: Functional for self-feeding Self-Feeding Abilities: Able to feed self;Needs assist;Needs set up Patient Positioning: Upright in bed(needed positioning) Baseline Vocal Quality: Hoarse(raspy) Volitional Cough: Strong;Congested(min) Volitional Swallow: Able to elicit    Oral/Motor/Sensory Function Overall Oral Motor/Sensory Function: Within functional limits   Ice Chips Ice chips: Within functional limits Presentation: Spoon(fed; 3 trials)   Thin Liquid Thin Liquid: Impaired Presentation: Cup;Self Fed(4 trials) Oral Phase Impairments: (none ) Oral Phase Functional Implications: (none) Pharyngeal  Phase Impairments: Cough - Delayed(x3/4 trials) Other Comments: no decline in vocal quality or O2 sats during/post    Nectar Thick Nectar Thick Liquid: Within functional limits Presentation: Cup;Self Fed;Straw(~2 ozs)   Honey Thick Honey Thick Liquid: Not tested   Puree Puree: Within functional limits Presentation: Self Fed;Spoon(~4 ozs)   Solid  Solid: Impaired Presentation: Spoon;Self Fed(5 trials) Oral Phase Impairments: Impaired  mastication(missing molars) Oral Phase Functional Implications: Impaired mastication(min) Pharyngeal Phase Impairments: (none)       Orinda Kenner, MS, CCC-SLP Timmie Calix 12/30/2019,10:55 AM

## 2019-12-30 NOTE — Consult Note (Addendum)
PHARMACY CONSULT NOTE  Pharmacy Consult for Electrolyte Monitoring and Replacement   Recent Labs: Potassium (mmol/L)  Date Value  12/30/2019 3.7  12/15/2014 4.2   Magnesium (mg/dL)  Date Value  56/86/1683 2.0   Calcium (mg/dL)  Date Value  72/90/2111 8.5 (L)   Calcium, Total (mg/dL)  Date Value  55/20/8022 8.6 (L)   Albumin (g/dL)  Date Value  33/61/2244 3.7  12/14/2014 3.9   Phosphorus (mg/dL)  Date Value  97/53/0051 5.0 (H)   Sodium (mmol/L)  Date Value  12/30/2019 135  12/15/2014 133 (L)    Assessment: 79 year-old female who presented with chest pain that radiated to her left arm, determined to be a STEMI. The patient had respiratory failure on 5/6 during the cardiac cath procedure and was transferred to CCU. She is currently intubated.  Goal of Therapy:  Potassium 4.0 - 5.1 mmol/L Magnesium 2.0 - 2.4 mg/dL All Other Electrolytes WNL  Plan:   KCl 20 mEq x 1 dose   Next electrolytes in am  Gardner Candle, PharmD, BCPS Clinical Pharmacist 12/30/2019 9:07 AM

## 2019-12-30 NOTE — Care Management (Signed)
This is a no charge note  Picck up from Dr. Jayme Cloud of PCCM  79 year old lady with past medical history of CAD, non-STEMI, hypertension, hyperlipidemia, diabetes mellitus, TIA, GERD, seizure, who was admitted to ICU due to STEMI and COVID-19 pneumonia.  Cardiology is seeing patient. Cardiac catheter is performed, but no intervention was done.  Patient is on IV heparin.  Patient is treated with remdesivir.  Patient was intubated during the procedure, but currently extubated.  Stabilized.  Blood pressure 145/97. We need to pick up this pt tomorrow morning.   Lorretta Harp, MD  [facility]   If 7PM-7AM, please contact night-coverage [email or web address] 12/30/2019, 10:39 AM

## 2019-12-30 NOTE — Evaluation (Signed)
Physical Therapy Evaluation Patient Details Name: Leah Small MRN: [id number] DOB: 09-Sep-1940 Today's Date: 12/30/2019   History of Present Illness  presented to ER secondary to chest pain, SOB; admitted for management of severe acute hypoxic and hypercapnic respiratory failure related to STEMI and COVID-19 PNA.  S/P cardiac cath 5/6 siginficant for multivessel native coronary disease moderate disease and recommended for medical/conservative managment; complicated by respiratory failure requiring intubation 5/6; extubated 5/7.  Clinical Impression  Upon evaluation, patient alert and oriented; follows commands and demonstrates good effort with mobility tasks.  Eager for OOB activities, but does fatigue quickly with exertion. Mild L UE > LE weakness appreciated (residual CVA effects), but strength/ROM grossly functional for basic transfers and mobility.  Currently requiring close sup for bed mobility; mod assist for sit/stand, basic transfers and gait (5') with RW.  Demonstrates fair step height/length, mild forward trunk flexion; limited balance reactions with intermittent posterior sway.  Vitals stable and WFL, but fatigued with minimal exertion.  Will continue to assess/progress as medically appropriate. Would benefit from skilled PT to address above deficits and promote optimal return to PLOF.; recommend transition to STR upon discharge from acute hospitalization.  Will continue to monitor and update as medical status improves.     Follow Up Recommendations SNF    Equipment Recommendations       Recommendations for Other Services       Precautions / Restrictions Precautions Precautions: Fall Restrictions Weight Bearing Restrictions: No      Mobility  Bed Mobility Overal bed mobility: Needs Assistance Bed Mobility: Supine to Sit     Supine to sit: Supervision     General bed mobility comments: increased time and effort, but able to complete without physical  assist  Transfers Overall transfer level: Needs assistance   Transfers: Sit to/from Stand Sit to Stand: Mod assist         General transfer comment: assist for lift off, anterior weight translation and initial balance with transition to upright (to prevent posterior LOB)  Ambulation/Gait Ambulation/Gait assistance: Min assist Gait Distance (Feet): 5 Feet Assistive device: Rolling walker (2 wheeled)       General Gait Details: fair step height/length, mild forward trunk flexion; limited balance reactions with intermittent posterior sway.  Vitals stable and WFL, but fatigued with minimal exertion  Stairs            Wheelchair Mobility    Modified Rankin (Stroke Patients Only)       Balance Overall balance assessment: Needs assistance Sitting-balance support: No upper extremity supported;Feet supported Sitting balance-Leahy Scale: Good     Standing balance support: Bilateral upper extremity supported Standing balance-Leahy Scale: Fair                               Pertinent Vitals/Pain Pain Assessment: No/denies pain    Home Living Family/patient expects to be discharged to:: Private residence Living Arrangements: Children Available Help at Discharge: Family;Available PRN/intermittently Type of Home: House Home Access: Ramped entrance     Home Layout: One level        Prior Function Level of Independence: Independent with assistive device(s)         Comments: Mod indep with 4WRW for ADLs (sponge bath) and household mobilization; assist from family for meals, household chores as needed. No home O2.  Endorses multiple fall history, most recent 4 weeks prior.     Hand Dominance   Dominant Hand: Right  Extremity/Trunk Assessment   Upper Extremity Assessment Upper Extremity Assessment: (R UE grossly 4+/5, L UE grossly 4-/5 (residual CVA))    Lower Extremity Assessment Lower Extremity Assessment: (R LE grossly 4+/5, L LE grossly  4-/5 (residual CVA effects))       Communication   Communication: No difficulties  Cognition Arousal/Alertness: Awake/alert Behavior During Therapy: WFL for tasks assessed/performed Overall Cognitive Status: Within Functional Limits for tasks assessed                                        General Comments      Exercises Other Exercises Other Exercises: Supine bilat LE therex, 1x10, active ROM: ankle pumps, heel slides, hip abduct/adduct and SLR Other Exercises: Reviewed use of incentive spirometer and flutter valve, 1x10, for pulmonary hygiene   Assessment/Plan    PT Assessment Patient needs continued PT services  PT Problem List Decreased strength;Decreased activity tolerance;Decreased balance;Decreased mobility;Decreased knowledge of use of DME;Decreased safety awareness;Decreased knowledge of precautions;Cardiopulmonary status limiting activity       PT Treatment Interventions DME instruction;Gait training;Functional mobility training;Therapeutic activities;Therapeutic exercise;Balance training;Neuromuscular re-education;Patient/family education    PT Goals (Current goals can be found in the Care Plan section)  Acute Rehab PT Goals Patient Stated Goal: to get stronger and do things myself again PT Goal Formulation: With patient Time For Goal Achievement: 01/13/20 Potential to Achieve Goals: Good    Frequency Min 2X/week   Barriers to discharge Decreased caregiver support      Co-evaluation               AM-PAC PT "6 Clicks" Mobility  Outcome Measure Help needed turning from your back to your side while in a flat bed without using bedrails?: None Help needed moving from lying on your back to sitting on the side of a flat bed without using bedrails?: None Help needed moving to and from a bed to a chair (including a wheelchair)?: A Little Help needed standing up from a chair using your arms (e.g., wheelchair or bedside chair)?: A Lot Help  needed to walk in hospital room?: A Little Help needed climbing 3-5 steps with a railing? : A Lot 6 Click Score: 18    End of Session Equipment Utilized During Treatment: Gait belt Activity Tolerance: Patient tolerated treatment well Patient left: in chair;with call bell/phone within reach(RN aware of position; patient educated in need for assist, agrees to call for assist as needed) Nurse Communication: Mobility status PT Visit Diagnosis: Muscle weakness (generalized) (M62.81);Difficulty in walking, not elsewhere classified (R26.2)    Time: 5170-0174 PT Time Calculation (min) (ACUTE ONLY): 31 min   Charges:   PT Evaluation $PT Eval Moderate Complexity: 1 Mod PT Treatments $Therapeutic Exercise: 8-22 mins $Therapeutic Activity: 8-22 mins        [place]anger Chui H. Owens Shark, PT, DPT, NCS 12/30/19, 12:11 PM [phone]

## 2019-12-30 NOTE — Plan of Care (Signed)

## 2019-12-30 NOTE — Evaluation (Signed)
Occupational Therapy Evaluation Patient Details Name: ETHYLENE REZNICK MRN: [id number] DOB: 29-Sep-1940 Today's Date: 12/30/2019    History of Present Illness presented to ER secondary to chest pain, SOB; admitted for management of severe acute hypoxic and hypercapnic respiratory failure related to STEMI and COVID-19 PNA.  S/P cardiac cath 5/6 siginficant for multivessel native coronary disease moderate disease and recommended for medical/conservative managment; complicated by respiratory failure requiring intubation 5/6; extubated 5/7.   Clinical Impression   Mrs Cauthon was seen for OT evaluation this date. Prior to hospital admission, pt was MOD I for mobility and ADLs c family assisting for IADLs. Pt presents to acute OT demonstrating impaired ADL performance and functional mobility 2/2 decreased activity tolerance and functional balance/strength deficits. Pt currently requires SETUP for face washing seated in chair. MOD A for SPT chair<>BSC and SETUP for perihygiene at [facility]. SBA don/doff B socks seated EOC. Pt was fatigued after toileting c vitals stable t/o. Pt would benefit from skilled OT to address noted impairments and functional limitations (see below for any additional details) in order to maximize safety and independence while minimizing falls risk and caregiver burden. Upon hospital discharge, recommend STR to maximize pt safety and return to PLOF.     Follow Up Recommendations  SNF    Equipment Recommendations  Other (comment)(TBD at next venue of care)    Recommendations for Other Services       Precautions / Restrictions Precautions Precautions: Fall Restrictions Weight Bearing Restrictions: No      Mobility Bed Mobility Overal bed mobility: Needs Assistance Bed Mobility: Supine to Sit     Supine to sit: Supervision     General bed mobility comments: Pt received and left up in chair   Transfers Overall transfer level: Needs assistance Equipment used: Rolling  walker (2 wheeled) Transfers: Sit to/from [employer] Sit to Stand: Mod assist Stand pivot transfers: Min assist       General transfer comment: assist for lift off, anterior weight translation and initial balance with transition to upright (to prevent posterior LOB)    Balance Overall balance assessment: Needs assistance Sitting-balance support: No upper extremity supported;Feet supported Sitting balance-Leahy Scale: Good     Standing balance support: Bilateral upper extremity supported Standing balance-Leahy Scale: Fair                             ADL either performed or assessed with clinical judgement   ADL Overall ADL's : Needs assistance/impaired                                       General ADL Comments: SETUP face washing seated in chair. MOD A BSC t/f c SETUP for perihygiene. SBA don/doff B socks seated EOC     Vision Baseline Vision/History: Wears glasses Wears Glasses: At all times       Perception     Praxis      Pertinent Vitals/Pain Pain Assessment: No/denies pain     Hand Dominance Right   Extremity/Trunk Assessment Upper Extremity Assessment Upper Extremity Assessment: (R UE grossly 4+/5, L UE grossly 4-/5 (residual CVA))   Lower Extremity Assessment Lower Extremity Assessment: ( R LE grossly 4+/5, L LE grossly 4-/5 (residual CVA effects))       Communication Communication Communication: No difficulties   Cognition Arousal/Alertness: Awake/alert Behavior During Therapy: WFL for tasks  assessed/performed Overall Cognitive Status: Within Functional Limits for tasks assessed                                     General Comments       Exercises Exercises: Other exercises Other Exercises Other Exercises: Pt educated re: IS, falls prevention, RW technique Other Exercises: Toileting, face washing, hand washing, sit<>stand x2, SPT chair<>BSC   Shoulder Instructions      Home Living  Family/patient expects to be discharged to:: Private residence Living Arrangements: Children Available Help at Discharge: Family;Available PRN/intermittently Type of Home: House Home Access: Ramped entrance     Home Layout: One level     Bathroom Shower/Tub: Walk-in shower         Home Equipment: Shower seat          Prior Functioning/Environment Level of Independence: Independent with assistive device(s)        Comments: Mod indep with 4WRW for ADLs (sponge bath) and household mobilization; assist from family for meals, household chores as needed. No home O2.  Endorses multiple fall history, most recent 4 weeks prior.        OT Problem List: Decreased activity tolerance;Impaired balance (sitting and/or standing);Decreased knowledge of use of DME or AE      OT Treatment/Interventions: Self-care/ADL training;Therapeutic exercise;Energy conservation;Neuromuscular education;DME and/or AE instruction;Therapeutic activities;Patient/family education;Balance training    OT Goals(Current goals can be found in the care plan section) Acute Rehab OT Goals Patient Stated Goal: to get stronger and do things myself again OT Goal Formulation: With patient Time For Goal Achievement: 01/13/20 Potential to Achieve Goals: Good ADL Goals Pt Will Perform Grooming: with set-up;sitting Pt Will Perform Lower Body Dressing: with modified independence;sit to/from stand(c LRAD PRN) Pt Will Transfer to Toilet: with supervision;stand pivot transfer;bedside commode(c LRAD PRN)  OT Frequency: Min 2X/week   Barriers to D/C: Decreased caregiver support          Co-evaluation              AM-PAC OT "6 Clicks" Daily Activity     Outcome Measure Help from another person eating meals?: None Help from another person taking care of personal grooming?: None Help from another person toileting, which includes using toliet, bedpan, or urinal?: A Little Help from another person bathing (including  washing, rinsing, drying)?: A Little Help from another person to put on and taking off regular upper body clothing?: A Little Help from another person to put on and taking off regular lower body clothing?: A Little 6 Click Score: 20   End of Session Equipment Utilized During Treatment: Rolling walker  Activity Tolerance: Patient tolerated treatment well Patient left: in chair;with call bell/phone within reach  OT Visit Diagnosis: Unsteadiness on feet (R26.81);Other abnormalities of gait and mobility (R26.89)                Time: 1335-1400 OT Time Calculation (min): 25 min Charges:  OT General Charges $OT Visit: 1 Visit OT Evaluation $OT Eval Moderate Complexity: 1 Mod OT Treatments $Self Care/Home Management : 23-37 mins  Dessie Coma, M.S. OTR/L  12/30/19, 2:45 PM

## 2019-12-30 NOTE — Progress Notes (Signed)
Pt started complaining of chest pain around 2040. EKG was obtained and B. Jon Billings NP was notified. Pt described chest pain as her angina in the past. I asked the pt for more details and pt stated it was sharp radiating to her left jaw and left arm. Pt states when the pain originally started to was 10/10 then came down to 7/10. Orders were given for SL nitro which was administered. Pt states the jaw pain is gone after the first nitro. Second nitro was administered and the pt pain was relieved. About an hour to hour half pt became hypotensive. Cliffton Asters NP notified and pt given NS bolus. Pt is still hypotensive will continue to monitor per orders.

## 2019-12-30 NOTE — Progress Notes (Signed)
Follow up - Critical Care Medicine Note  Patient Details:    Leah Small is an 79 y.o. female patient with known advanced coronary disease presented with chest discomfort brought by EMS to [facility] ED noted had emergent cardiac catheterization, had apneic episode during catheterization and had to be intubated.  Now on ventilator.  Incidentally COVID-19 test was positive.  Lines, Airways, Drains: Urethral Catheter ashley williams RN Non-latex 16 Fr. (Active)  Indication for Insertion or Continuance of Catheter Therapy based on hourly urine output monitoring and documentation for critical condition (NOT STRICT I&O) 12/29/19 1600  Site Assessment Clean;Intact 12/29/19 1913  Catheter Maintenance Bag below level of bladder;Catheter secured;Drainage bag/tubing not touching floor;Insertion date on drainage bag;No dependent loops;Seal intact 12/29/19 1913  Collection Container Standard drainage bag 12/29/19 1913  Securement Method Securing device (Describe) 12/29/19 1913  Urinary Catheter Interventions (if applicable) Unclamped 12/29/19 1600  Output (mL) 350 mL 12/29/19 1959    Anti-infectives:  Anti-infectives (From admission, onward)   Start     Dose/Rate Route Frequency Ordered Stop   12/30/19 1030  doxycycline (VIBRA-TABS) tablet 100 mg     100 mg Oral Every 12 hours 12/30/19 1028 01/02/20 2159   12/29/19 1000  remdesivir 100 mg in sodium chloride 0.9 % 100 mL IVPB     100 mg 200 mL/hr over 30 Minutes Intravenous Daily 12/28/19 1859 01/02/20 0959   12/28/19 2100  remdesivir 200 mg in sodium chloride 0.9% 250 mL IVPB     200 mg 580 mL/hr over 30 Minutes Intravenous Once 12/28/19 1859 12/28/19 2237      Microbiology: Results for orders placed or performed during the hospital encounter of 12/28/19  Respiratory Panel by RT PCR (Flu A&B, Covid) - Nasopharyngeal Swab     Status: Abnormal   Collection Time: 12/28/19  4:01 PM   Specimen: Nasopharyngeal Swab  Result Value Ref Range Status    SARS Coronavirus 2 by RT PCR POSITIVE (A) NEGATIVE Final    Comment: RESULT CALLED TO, READ BACK BY AND VERIFIED WITH: DR.COVERT(CATH) AT 1707 ON 12/28/2019 BY MOSLEY,J (NOTE) SARS-CoV-2 target nucleic acids are DETECTED. SARS-CoV-2 RNA is generally detectable in upper respiratory specimens  during the acute phase of infection. Positive results are indicative of the presence of the identified virus, but do not rule out bacterial infection or co-infection with other pathogens not detected by the test. Clinical correlation with patient history and other diagnostic information is necessary to determine patient infection status. The expected result is Negative. Fact Sheet for Patients:  https://www.moore.com/ Fact Sheet for Healthcare Providers: https://www.young.biz/ This test is not yet approved or cleared by the [place] FDA and  has been authorized for detection and/or diagnosis of SARS-CoV-2 by FDA under an Emergency Use Authorization (EUA).  This EUA will remain in effect (meaning this test ca n be used) for the duration of  the COVID-19 declaration under Section 564(b)(1) of the Act, 21 U.S.C. section 360bbb-3(b)(1), unless the authorization is terminated or revoked sooner.    Influenza A by PCR NEGATIVE NEGATIVE Final   Influenza B by PCR NEGATIVE NEGATIVE Final    Comment: (NOTE) The Xpert Xpress SARS-CoV-2/FLU/RSV assay is intended as an aid in  the diagnosis of influenza from Nasopharyngeal swab specimens and  should not be used as a sole basis for treatment. Nasal washings and  aspirates are unacceptable for Xpert Xpress SARS-CoV-2/FLU/RSV  testing. Fact Sheet for Patients: https://www.moore.com/ Fact Sheet for Healthcare Providers: https://www.young.biz/ This test is not yet approved  or cleared by the [place] and  has been authorized for detection and/or diagnosis of SARS-CoV-2 by   FDA under an Emergency Use Authorization (EUA). This EUA will remain  in effect (meaning this test can be used) for the duration of the  Covid-19 declaration under Section 564(b)(1) of the Act, 21  U.S.C. section 360bbb-3(b)(1), unless the authorization is  terminated or revoked. Performed at [facility], [address], [place], [place] [address]   MRSA PCR Screening     Status: None   Collection Time: 12/28/19  6:16 PM   Specimen: Urine, Catheterized; Nasopharyngeal  Result Value Ref Range Status   MRSA by PCR NEGATIVE NEGATIVE Final    Comment:        The GeneXpert MRSA Assay (FDA approved for NASAL specimens only), is one component of a comprehensive MRSA colonization surveillance program. It is not intended to diagnose MRSA infection nor to guide or monitor treatment for MRSA infections. Performed at [facility], [address] [place], [place] [address]     Best Practice/Protocols:  VTE Prophylaxis: Heparin (drip) GI Prophylaxis: Antihistamine   Events:   Studies: DG Abd 1 View  Result Date: 12/28/2019 CLINICAL DATA:  Tube placement EXAM: ABDOMEN - 1 VIEW COMPARISON:  None. FINDINGS: The enteric tube projects over the gastric body. There is a large amount of stool in the partially visualized colon. The visualized bowel gas pattern is unremarkable. IMPRESSION: Enteric tube projects over the gastric body. Large stool burden in the partially visualized colon. Electronically Signed   By: Constance Holster M.D.   On: 12/28/2019 18:50   CARDIAC CATHETERIZATION  Result Date: 12/28/2019  Prox LAD lesion is 100% stenosed.  Dist LAD lesion is 30% stenosed.  Non-stenotic Ost Ramus to Ramus lesion was previously treated.  Ramus lesion is 30% stenosed.  Mid Cx lesion is 100% stenosed.  Prox Cx lesion is 90% stenosed.  Prox RCA to Mid RCA lesion is 90% stenosed.  Mid RCA lesion is 100% stenosed.  LIMA. No significant disease with insertion into the  mid LAD  Prox Graft lesion is 100% stenosed.  Prox Graft lesion is 100% stenosed.  Prox Graft lesion is 100% stenosed.  Conclusion STEMI presentation with cardiac cath Left heart cath with grafts intervention was deferred Multivessel native coronary disease moderate disease in left main totally occluded ostial LAD proximal circumflex ostial right Totally occluded SVG to RCA circumflex and diagonal LIMA to mid LAD was normal Left ventriculogram showed moderately depressed left ventricular function around 35% globally Unsuccessful minx FemoStop was placed Patient was intubated sedated Case was then transferred to ICU critical care team for management Conservative cardiac input at this point   DG Chest Port 1 View  Result Date: 12/29/2019 CLINICAL DATA:  Acute respiratory failure EXAM: PORTABLE CHEST 1 VIEW COMPARISON:  Radiograph 12/28/2019 FINDINGS: Low positioning of the endotracheal tube which approximates the orifice of the right mainstem bronchus, recommend retraction at least 3 cm to the mid trachea. Transesophageal tube tip below the GE junction, beyond the level of imaging. Telemetry leads overlie the chest. Mild hyperexpansion of the lungs with some central vascular congestion. Increasing prominence of the interstitium with some more hazy opacity in the right lung base. No visible pneumothorax or effusion. 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 No acute osseous or soft tissue abnormality. Degenerative changes are present in the imaged spine and shoulders. IMPRESSION: 1. Low positioning of the endotracheal tube which approximates  the orifice of the right mainstem bronchus. Recommend retraction at least 3 cm. 2. Satisfactory positioning of the transesophageal tube. 3. Increasing prominence of the interstitium with some more hazy opacity in the right lung base, could reflect developing edema or infection. These results will be called to the ordering clinician or  representative by the Radiologist Assistant, and communication documented in the PACS or [employer]. Electronically Signed   By: Kreg Shropshire M.D.   On: 12/29/2019 05:50   DG Chest Port 1 View  Result Date: 12/28/2019 CLINICAL DATA:  Dyspnea. EXAM: PORTABLE CHEST 1 VIEW COMPARISON:  12/17/2017 FINDINGS: The endotracheal tube terminates above the carina by approximately 2.6 cm. The enteric tube extends below the left hemidiaphragm. The heart size is mildly enlarged. The patient is status post prior median sternotomy. There is prominence of the pulmonary vasculature. There are atherosclerotic changes of the thoracic aorta. The lungs appear slightly hyperexpanded. There are prominent interstitial lung markings. There is no definite acute osseous abnormality. IMPRESSION: 1. Lines and tubes as above. 2. Findings suggestive of developing interstitial edema. 3.  Aortic Atherosclerosis (ICD10-I70.0). Electronically Signed   By: Katherine Mantle M.D.   On: 12/28/2019 18:50   ECHOCARDIOGRAM COMPLETE  Result Date: 12/29/2019    ECHOCARDIOGRAM REPORT   Patient Name:   Leah Small Date of Exam: 12/29/2019 Medical Rec #:  [id number]       Height:       68.0 in Accession #:    [id number]      Weight:       122.0 lb Date of Birth:  [birth year]       BSA:          1.657 m Patient Age:    78 years        BP:           111/61 mmHg Patient Gender: F               HR:           74 bpm. Exam Location:  [facility] Procedure: 2D Echo, Color Doppler and Cardiac Doppler Indications:     I21.3 STEMI  History:         Patient has prior history of Echocardiogram examinations. CAD,                  TIA; Risk Factors:Diabetes and HCL.  Sonographer:     Humphrey Rolls RDCS (AE) Referring Phys:  [id number] [facility] CALL[facility]OD Diagnosing Phys: Arnoldo Hooker MD  Sonographer Comments: Suboptimal apical window, suboptimal subcostal window and echo performed with patient supine and on artificial respirator. IMPRESSIONS  1. Left ventricular ejection  fraction, by estimation, is <20%. The left ventricle has severely decreased function. The left ventricle demonstrates global hypokinesis. The left ventricular internal cavity size was mildly to moderately dilated. Left ventricular diastolic parameters are consistent with Grade I diastolic dysfunction (impaired relaxation).  2. Right ventricular systolic function is normal. The right ventricular size is normal. There is normal pulmonary artery systolic pressure.  3. Left atrial size was mildly dilated.  4. Right atrial size was mildly dilated.  5. The mitral valve is normal in structure. Mild to moderate mitral valve regurgitation.  6. The aortic valve is normal in structure. Aortic valve regurgitation is trivial. FINDINGS  Left Ventricle: Left ventricular ejection fraction, by estimation, is <20%. The left ventricle has severely decreased function. The left ventricle demonstrates global hypokinesis. The left ventricular internal cavity size was mildly to moderately  dilated. There is borderline left ventricular hypertrophy. Left ventricular diastolic parameters are consistent with Grade I diastolic dysfunction (impaired relaxation). Right Ventricle: The right ventricular size is normal. No increase in right ventricular wall thickness. Right ventricular systolic function is normal. There is normal pulmonary artery systolic pressure. The tricuspid regurgitant velocity is 2.19 m/s, and  with an assumed right atrial pressure of 10 mmHg, the estimated right ventricular systolic pressure is 29.2 mmHg. Left Atrium: Left atrial size was mildly dilated. Right Atrium: Right atrial size was mildly dilated. Pericardium: There is no evidence of pericardial effusion. Mitral Valve: The mitral valve is normal in structure. Mild to moderate mitral valve regurgitation. MV peak gradient, 2.4 mmHg. The mean mitral valve gradient is 1.0 mmHg. Tricuspid Valve: The tricuspid valve is normal in structure. Tricuspid valve regurgitation is  mild. Aortic Valve: The aortic valve is normal in structure. Aortic valve regurgitation is trivial. Aortic valve mean gradient measures 1.0 mmHg. Aortic valve peak gradient measures 2.5 mmHg. Aortic valve area, by VTI measures 1.92 cm. Pulmonic Valve: The pulmonic valve was normal in structure. Pulmonic valve regurgitation is not visualized. Aorta: The aortic root and ascending aorta are structurally normal, with no evidence of dilitation. IAS/Shunts: No atrial level shunt detected by color flow Doppler.  LEFT VENTRICLE PLAX 2D LVIDd:         4.98 cm  Diastology LVIDs:         4.43 cm  LV e' lateral:   5.11 cm/s LV PW:         0.83 cm  LV E/e' lateral: 11.4 LV IVS:        0.79 cm  LV e' medial:    3.05 cm/s LVOT diam:     1.60 cm  LV E/e' medial:  19.1 LV SV:         20 LV SV Index:   12 LVOT Area:     2.01 cm  LEFT ATRIUM         Index LA diam:    3.30 cm 1.99 cm/m  AORTIC VALVE                   PULMONIC VALVE AV Area (Vmax):    1.67 cm    PV Vmax:       0.69 m/s AV Area (Vmean):   1.87 cm    PV Vmean:      47.600 cm/s AV Area (VTI):     1.92 cm    PV VTI:        0.101 m AV Vmax:           79.20 cm/s  PV Peak grad:  1.9 mmHg AV Vmean:          47.000 cm/s PV Mean grad:  1.0 mmHg AV VTI:            0.106 m AV Peak Grad:      2.5 mmHg AV Mean Grad:      1.0 mmHg LVOT Vmax:         65.80 cm/s LVOT Vmean:        43.700 cm/s LVOT VTI:          0.101 m LVOT/AV VTI ratio: 0.95  AORTA Ao Root diam: 2.40 cm MITRAL VALVE               TRICUSPID VALVE MV Area (PHT): 3.91 cm    TR Peak grad:   19.2 mmHg MV Peak grad:  2.4 mmHg  TR Vmax:        219.00 cm/s MV Mean grad:  1.0 mmHg MV Vmax:       0.78 m/s    SHUNTS MV Vmean:      48.7 cm/s   Systemic VTI:  0.10 m MV Decel Time: 194 msec    Systemic Diam: 1.60 cm MV E velocity: 58.20 cm/s MV A velocity: 34.90 cm/s MV E/A ratio:  1.67 Arnoldo Hooker MD Electronically signed by Arnoldo Hooker MD Signature Date/Time: 12/29/2019/3:56:35 PM    Final      Consults: Cardiology  Subjective:    Overnight Issues: No overnight issues, remains on the ventilator.  Does awaken on SAT.  Will attempt SBT.  Objective:  Vital signs for last 24 hours: Temp:  [97.4 F (36.3 C)-98 F (36.7 C)] 97.6 F (36.4 C) (05/08 1600) Pulse Rate:  [43-96] 68 (05/08 1600) Resp:  [0-22] 18 (05/08 1900) BP: (96-155)/(46-97) 139/56 (05/08 1900) SpO2:  [92 %-100 %] 100 % (05/08 1600)  Hemodynamic parameters for last 24 hours:    Intake/Output from previous day: 05/07 0701 - 05/08 0700 In: 587.1 [I.V.:264.8; IV Piggyback:322.3] Out: 1380 [Urine:1380]  Intake/Output this shift: No intake/output data recorded.  Vent settings for last 24 hours:    Physical Exam:  GENERAL: Frail-appearing woman, no distress, mild hoarseness HEAD: Normocephalic, atraumatic.  EYES: Pupils equal, round, reactive to light.  No scleral icterus.  MOUTH: Edentulous, oral mucosa moist NECK: Supple. No thyromegaly.  Trachea midline. No JVD.  PULMONARY: Limited exam due to PPE/CAPR no increased work of breathing.  Expectorating grayish-looking sputum. CARDIOVASCULAR: Monitor shows sinus rhythm  GASTROINTESTINAL: Nondistended abdomen, soft.  Nontender. MUSCULOSKELETAL: No joint deformity, no clubbing, no edema.  NEUROLOGIC: No focal deficits. SKIN: Intact,warm,dry. PSYCH: Mood and behavior appropriate  Assessment/Plan:   Acute hypoxic respiratory failure, apneic episode during cardiac cath Status post STEMI in setting of chronic anginal symptoms Extubated yesterday No sequela SpO2: 100 % O2 Flow Rate (L/min): 2 L/min FiO2 (%): 21 % Comfortable on 2 L/min via nasal cannula Wean oxygen as tolerated for saturations of 92% or better   Coronary artery disease Chronic angina Ischemic cardiomyopathy EF <20% Status post STEMI Significant reduction in EF from prior Cardiology following, appreciate input Continue heparin infusion Discontinued IV steroids Diuretics as  needed/tolerated Resume Ranexa and isosorbide when able-per cardiology Continue Plavix  Diabetes mellitus type 2 Sliding scale insulin Discontinued steroids  COVID-19 infection Increased sputum production No overt pneumonitis on chest x-ray Oxygen requirements low Continue remdesivir Will withhold steroids due to STEMI, poor glycemic control Empiric doxycycline 100 mg twice daily  Seizure disorder Continue Keppra Continue Lamictal   Pressure Injury 12/28/19 Buttocks Left Stage 3 -  Full thickness tissue loss. Subcutaneous fat may be visible but bone, tendon or muscle are NOT exposed. (Active)  12/28/19 1800  Location: Buttocks  Location Orientation: Left  Staging: Stage 3 -  Full thickness tissue loss. Subcutaneous fat may be visible but bone, tendon or muscle are NOT exposed.  Wound Description (Comments):   Present on Admission: Yes      LOS: 2 days   Additional comments: Patient to be transferred to hospitalist service in the morning, may transfer to telemetry floor.  Critical Care Total Time*:    C. Danice Goltz, MD [facility] 12/30/2019  *Care during the described time interval was provided by me and/or other providers on the critical care team.  I have reviewed this patient's available data, including medical history, events of note,  physical examination and test results as part of my evaluation.   **This note was dictated using voice recognition software/Dragon.  Despite best efforts to proofread, errors can occur which can change the meaning.  Any change was purely unintentional.

## 2019-12-30 NOTE — Progress Notes (Addendum)
Cross cover brief note Patient with chest pain sharp, radiating to left jaw and arm similar to angina  previously experienced. BP stable . EKG without changes from prior. Partial relief with first sl nitro, almost complete relief with second.  Troponin pending. Will monitor with serial troponins and EKG given recent STEMI.  Remains on IV heparin

## 2019-12-30 NOTE — Consult Note (Signed)
ANTICOAGULATION CONSULT NOTE  Pharmacy Consult for Heparin  Indication: ACS / STEMI  Patient Measurements:   Heparin Dosing Weight: 55.3 kg   Vital Signs: Temp: 97.6 F (36.4 C) (05/08 1600) Temp Source: Oral (05/08 1600) BP: 139/56 (05/08 1900) Pulse Rate: 68 (05/08 1600)  Labs: Recent Labs    12/28/19 1601 12/28/19 1601 12/28/19 1822 12/28/19 2321 12/29/19 0106 12/29/19 1009 12/29/19 2329 12/30/19 0721 12/30/19 1720  HGB 8.2*   < >  --   --  8.0*  --   --  9.2*  --   HCT 26.5*  --   --   --  25.5*  --   --  29.0*  --   PLT 310  --   --   --  224  --   --  236  --   APTT >160*  --   --   --   --   --   --   --   --   LABPROT 14.7  --   --   --   --   --   --   --   --   INR 1.2  --   --   --   --   --   --   --   --   HEPARINUNFRC  --   --   --   --  0.29*   < > 0.43 0.71* 0.64  CREATININE 0.91  --   --   --  0.94  --   --  0.82  --   TROPONINIHS 13  --  587* 2,712*  --   --   --   --   --    < > = values in this interval not displayed.    Medications:  Per chart review, no prior anticoagulants at home.   Assessment: Pharmacy has been consulted for heparin dosing in patient who presented with chest pain that radiated to her left arm. The patient had respiratory failure on 5/6 during the cardiac cath procedure and was transferred to CCU. She is currently intubated. Currently H&H is low but stable and platelets are WNL  Heparin Course: 5/6 initiation: 3300 unit bolus then 650 units/hr 5/7 0106 HL 0.29: inc to 800 units/hr 5/7 1009 HL 0.67: dec to 750 units/hr 05/07 @ 2330 HL 0.43 therapeutic.  Goal of Therapy:  Heparin level 0.3-0.7 units/ml Monitor platelets by anticoagulation protocol: Yes   Plan:  05/08 @  HL 0.71 Level is supreatherapeutic. Will decrease heparin infusion rate to 650 units/hr and will recheck HL in 8 hours.  Continue to monitor Hgb and Plts.  CBC check w/ am labs.  5/8: HL @ 1720 = 0.64 Will continue pt on current rate and recheck HL in  8 hrs on 5/9 @ 0100.   Scherrie Gerlach, PharmD Clinical Pharmacist 12/30/2019 8:01 PM

## 2019-12-30 NOTE — Consult Note (Addendum)
ANTICOAGULATION CONSULT NOTE  Pharmacy Consult for Heparin  Indication: ACS / STEMI  Patient Measurements:   Heparin Dosing Weight: 55.3 kg   Vital Signs: Temp: 97.9 F (36.6 C) (05/08 0749) Temp Source: Oral (05/08 0749) BP: 145/97 (05/08 0800) Pulse Rate: 96 (05/08 0800)  Labs: Recent Labs    12/28/19 1601 12/28/19 1601 12/28/19 1822 12/28/19 2321 12/29/19 0106 12/29/19 0106 12/29/19 1009 12/29/19 2329 12/30/19 0721  HGB 8.2*   < >  --   --  8.0*  --   --   --  9.2*  HCT 26.5*  --   --   --  25.5*  --   --   --  29.0*  PLT 310  --   --   --  224  --   --   --  236  APTT >160*  --   --   --   --   --   --   --   --   LABPROT 14.7  --   --   --   --   --   --   --   --   INR 1.2  --   --   --   --   --   --   --   --   HEPARINUNFRC  --   --   --   --  0.29*   < > 0.67 0.43 0.71*  CREATININE 0.91  --   --   --  0.94  --   --   --  0.82  TROPONINIHS 13  --  587* 2,712*  --   --   --   --   --    < > = values in this interval not displayed.    Medications:  Per chart review, no prior anticoagulants at home.   Assessment: Pharmacy has been consulted for heparin dosing in patient who presented with chest pain that radiated to her left arm. The patient had respiratory failure on 5/6 during the cardiac cath procedure and was transferred to CCU. She is currently intubated. Currently H&H is low but stable and platelets are WNL  Heparin Course: 5/6 initiation: 3300 unit bolus then 650 units/hr 5/7 0106 HL 0.29: inc to 800 units/hr 5/7 1009 HL 0.67: dec to 750 units/hr 05/07 @ 2330 HL 0.43 therapeutic.  Goal of Therapy:  Heparin level 0.3-0.7 units/ml Monitor platelets by anticoagulation protocol: Yes   Plan:  05/08 @  HL 0.71 Level is supreatherapeutic. Will decrease heparin infusion rate to 650 units/hr and will recheck HL in 8 hours.  Continue to monitor Hgb and Plts.  CBC check w/ am labs.  Gardner Candle, PharmD, BCPS Clinical Pharmacist 12/30/2019 8:59  AM

## 2019-12-30 NOTE — Progress Notes (Addendum)
Shift summary:  - Patient is AA+Ox4. - Room air. Aggressive pulm hygiene measures, including: incentive spirometry, flutter valve, cough/deep breathing, and oral care. - SLP consult pending for diet. - Foley catheter removed this AM; waiting void. - Plan to request PT, OT today.

## 2019-12-31 ENCOUNTER — Other Ambulatory Visit: Payer: Self-pay

## 2019-12-31 DIAGNOSIS — D649 Anemia, unspecified: Secondary | ICD-10-CM

## 2019-12-31 LAB — CBC WITH DIFFERENTIAL/PLATELET
Abs Immature Granulocytes: 0.05 10*3/uL (ref 0.00–0.07)
Basophils Absolute: 0 10*3/uL (ref 0.0–0.1)
Basophils Relative: 0 %
Eosinophils Absolute: 0 10*3/uL (ref 0.0–0.5)
Eosinophils Relative: 0 %
HCT: 27.6 % — ABNORMAL LOW (ref 36.0–46.0)
Hemoglobin: 8.6 g/dL — ABNORMAL LOW (ref 12.0–15.0)
Immature Granulocytes: 1 %
Lymphocytes Relative: 14 %
Lymphs Abs: 1.3 10*3/uL (ref 0.7–4.0)
MCH: 30.2 pg (ref 26.0–34.0)
MCHC: 31.2 g/dL (ref 30.0–36.0)
MCV: 96.8 fL (ref 80.0–100.0)
Monocytes Absolute: 0.8 10*3/uL (ref 0.1–1.0)
Monocytes Relative: 9 %
Neutro Abs: 7 10*3/uL (ref 1.7–7.7)
Neutrophils Relative %: 76 %
Platelets: 205 10*3/uL (ref 150–400)
RBC: 2.85 MIL/uL — ABNORMAL LOW (ref 3.87–5.11)
RDW: 18 % — ABNORMAL HIGH (ref 11.5–15.5)
WBC: 9.1 10*3/uL (ref 4.0–10.5)
nRBC: 0 % (ref 0.0–0.2)

## 2019-12-31 LAB — BASIC METABOLIC PANEL
Anion gap: 7 (ref 5–15)
BUN: 40 mg/dL — ABNORMAL HIGH (ref 8–23)
CO2: 22 mmol/L (ref 22–32)
Calcium: 8.8 mg/dL — ABNORMAL LOW (ref 8.9–10.3)
Chloride: 108 mmol/L (ref 98–111)
Creatinine, Ser: 0.97 mg/dL (ref 0.44–1.00)
GFR calc Af Amer: >60 mL/min (ref >60)
GFR calc non Af Amer: 56 mL/min — ABNORMAL LOW (ref >60)
Glucose, Bld: 183 mg/dL — ABNORMAL HIGH (ref 70–99)
Potassium: 3.6 mmol/L (ref 3.5–5.1)
Sodium: 137 mmol/L (ref 135–145)

## 2019-12-31 LAB — C-REACTIVE PROTEIN: CRP: 6.1 mg/dL — ABNORMAL HIGH (ref <1.0)

## 2019-12-31 LAB — COMPREHENSIVE METABOLIC PANEL
ALT: 29 U/L (ref 0–44)
AST: 30 U/L (ref 15–41)
Albumin: 3.7 g/dL (ref 3.5–5.0)
Alkaline Phosphatase: 61 U/L (ref 38–126)
Anion gap: 8 (ref 5–15)
BUN: 40 mg/dL — ABNORMAL HIGH (ref 8–23)
CO2: 21 mmol/L — ABNORMAL LOW (ref 22–32)
Calcium: 8.8 mg/dL — ABNORMAL LOW (ref 8.9–10.3)
Chloride: 108 mmol/L (ref 98–111)
Creatinine, Ser: 0.96 mg/dL (ref 0.44–1.00)
GFR calc Af Amer: >60 mL/min (ref >60)
GFR calc non Af Amer: 56 mL/min — ABNORMAL LOW (ref >60)
Glucose, Bld: 207 mg/dL — ABNORMAL HIGH (ref 70–99)
Potassium: 3.7 mmol/L (ref 3.5–5.1)
Sodium: 137 mmol/L (ref 135–145)
Total Bilirubin: 0.5 mg/dL (ref 0.3–1.2)
Total Protein: 7.3 g/dL (ref 6.5–8.1)

## 2019-12-31 LAB — FIBRIN DERIVATIVES D-DIMER (ARMC ONLY): Fibrin derivatives D-dimer (ARMC): 601.58 ng/mL (FEU) — ABNORMAL HIGH (ref 0.00–499.00)

## 2019-12-31 LAB — GLUCOSE, CAPILLARY
Glucose-Capillary: 116 mg/dL — ABNORMAL HIGH (ref 70–99)
Glucose-Capillary: 211 mg/dL — ABNORMAL HIGH (ref 70–99)
Glucose-Capillary: 236 mg/dL — ABNORMAL HIGH (ref 70–99)
Glucose-Capillary: 241 mg/dL — ABNORMAL HIGH (ref 70–99)
Glucose-Capillary: 334 mg/dL — ABNORMAL HIGH (ref 70–99)
Glucose-Capillary: 84 mg/dL (ref 70–99)

## 2019-12-31 LAB — HEPARIN LEVEL (UNFRACTIONATED)
Heparin Unfractionated: 0.31 IU/mL (ref 0.30–0.70)
Heparin Unfractionated: 0.18 IU/mL — ABNORMAL LOW (ref 0.30–0.70)
Heparin Unfractionated: >3.6 IU/mL — ABNORMAL HIGH (ref 0.30–0.70)
Heparin Unfractionated: 0.38 IU/mL (ref 0.30–0.70)

## 2019-12-31 LAB — CORTISOL: Cortisol, Plasma: 21 ug/dL

## 2019-12-31 LAB — FIBRINOGEN: Fibrinogen: 457 mg/dL (ref 210–475)

## 2019-12-31 LAB — MAGNESIUM: Magnesium: 2.1 mg/dL (ref 1.7–2.4)

## 2019-12-31 LAB — TRIGLYCERIDES: Triglycerides: 137 mg/dL (ref <150)

## 2019-12-31 MED ORDER — ASCORBIC ACID 500 MG PO TABS
500.0000 mg | ORAL_TABLET | Freq: Every day | ORAL | Status: DC
  Administered 2019-12-31 – 2020-01-03 (×4): 500 mg via ORAL
  Filled 2019-12-31 (×3): qty 1

## 2019-12-31 MED ORDER — MIDODRINE HCL 5 MG PO TABS
5.0000 mg | ORAL_TABLET | Freq: Three times a day (TID) | ORAL | Status: DC
  Administered 2019-12-31 – 2020-01-01 (×5): 5 mg via ORAL
  Filled 2019-12-31 (×6): qty 1

## 2019-12-31 MED ORDER — HEPARIN BOLUS VIA INFUSION
1000.0000 [IU] | Freq: Once | INTRAVENOUS | Status: AC
  Administered 2019-12-31: 1000 [IU] via INTRAVENOUS
  Filled 2019-12-31: qty 1000

## 2019-12-31 MED ORDER — NOREPINEPHRINE 4 MG/250ML-% IV SOLN
0.0000 ug/min | INTRAVENOUS | Status: DC
  Administered 2019-12-31: 2 ug/min via INTRAVENOUS
  Filled 2019-12-31: qty 250

## 2019-12-31 MED ORDER — POTASSIUM CHLORIDE 20 MEQ/15ML (10%) PO SOLN
20.0000 meq | Freq: Once | ORAL | Status: DC
  Filled 2019-12-31: qty 15

## 2019-12-31 MED ORDER — ZINC SULFATE 220 (50 ZN) MG PO CAPS
220.0000 mg | ORAL_CAPSULE | Freq: Every day | ORAL | Status: DC
  Administered 2019-12-31 – 2020-01-03 (×4): 220 mg via ORAL
  Filled 2019-12-31 (×3): qty 1

## 2019-12-31 MED ORDER — POTASSIUM CHLORIDE 20 MEQ/15ML (10%) PO SOLN
20.0000 meq | Freq: Once | ORAL | Status: AC
  Administered 2019-12-31: 20 meq via ORAL
  Filled 2019-12-31: qty 15

## 2019-12-31 MED ORDER — SODIUM CHLORIDE 0.9 % IV BOLUS
1000.0000 mL | Freq: Once | INTRAVENOUS | Status: AC
  Administered 2019-12-31: 1000 mL via INTRAVENOUS

## 2019-12-31 MED ORDER — LAMOTRIGINE 100 MG PO TABS
100.0000 mg | ORAL_TABLET | Freq: Every day | ORAL | Status: DC
  Administered 2019-12-31 – 2020-01-03 (×4): 100 mg via ORAL
  Filled 2019-12-31: qty 4
  Filled 2019-12-31: qty 1
  Filled 2019-12-31: qty 4

## 2019-12-31 MED ORDER — RANOLAZINE ER 500 MG PO TB12
500.0000 mg | ORAL_TABLET | Freq: Two times a day (BID) | ORAL | Status: DC
  Administered 2019-12-31 – 2020-01-01 (×2): 500 mg via ORAL
  Filled 2019-12-31 (×4): qty 1

## 2019-12-31 NOTE — Consult Note (Signed)
ANTICOAGULATION CONSULT NOTE  Pharmacy Consult for Heparin  Indication: ACS / STEMI  Patient Measurements:   Heparin Dosing Weight: 55.3 kg   Vital Signs: Temp: 97.3 F (36.3 C) (05/09 2013) Temp Source: Oral (05/09 2013) BP: 123/45 (05/09 1815) Pulse Rate: 88 (05/09 1700)  Labs: Recent Labs     0000 12/28/19 2321 12/29/19 0106 12/29/19 1009 12/30/19 0721 12/30/19 1720 12/30/19 2121 12/30/19 2233 12/31/19 0123 12/31/19 0903 12/31/19 1849  HGB   < >  --  8.0*  --  9.2*  --   --   --  8.6*  --   --   HCT  --   --  25.5*  --  29.0*  --   --   --  27.6*  --   --   PLT  --   --  224  --  236  --   --   --  205  --   --   HEPARINUNFRC  --   --  0.29*   < > 0.71*   < >  --   --  0.31 0.18* >3.60*  CREATININE  --   --  0.94  --  0.82  --   --   --  0.96  0.97  --   --   TROPONINIHS  --  2,712*  --   --   --   --  682* 763*  --   --   --    < > = values in this interval not displayed.    Medications:  Per chart review, no prior anticoagulants at home.   Assessment: Pharmacy has been consulted for heparin dosing in patient who presented with chest pain that radiated to her left arm. The patient had respiratory failure on 5/6 during the cardiac cath procedure and was transferred to CCU. Currently H&H is low but stable and platelets are WNL  Heparin Course: 5/9 0123 HL 0.31  5/9 0903 HL 0.18   Goal of Therapy:  Heparin level 0.3-0.7 units/ml Monitor platelets by anticoagulation protocol: Yes   Plan:  Heparin level is subtherapeutic. Will give a 1000 unit bolus and increased heparin rate to 750 units/hr. CBC stable. CBC daily while on heparin. Heparin level ordered in 8 hours.   5/9:  HL @ 1849 = 3.6 Suspect this was drawn in error since the HL went from 0.18 to 3.6 with minor increase in heparin drip rate.  Will repeat HL stat ,  Called lab and reminded them not to draw from same line that heparin is infusing.   Scherrie Gerlach, PharmD Clinical  Pharmacist 12/31/2019 10:33 PM

## 2019-12-31 NOTE — Consult Note (Signed)
ANTICOAGULATION CONSULT NOTE  Pharmacy Consult for Heparin  Indication: ACS / STEMI  Patient Measurements:   Heparin Dosing Weight: 55.3 kg   Vital Signs: Temp: 97.9 F (36.6 C) (05/09 0800) Temp Source: Oral (05/09 0800) BP: 145/105 (05/09 0900) Pulse Rate: 78 (05/09 0800)  Labs: Recent Labs    12/28/19 1601 12/28/19 1601 12/28/19 1822 12/28/19 2321 12/29/19 0106 12/29/19 1009 12/30/19 0721 12/30/19 0721 12/30/19 1720 12/30/19 2121 12/30/19 2233 12/31/19 0123 12/31/19 0903  HGB 8.2*   < >  --   --  8.0*  --  9.2*  --   --   --   --  8.6*  --   HCT 26.5*   < >  --   --  25.5*  --  29.0*  --   --   --   --  27.6*  --   PLT 310   < >  --   --  224  --  236  --   --   --   --  205  --   APTT >160*  --   --   --   --   --   --   --   --   --   --   --   --   LABPROT 14.7  --   --   --   --   --   --   --   --   --   --   --   --   INR 1.2  --   --   --   --   --   --   --   --   --   --   --   --   HEPARINUNFRC  --   --   --   --  0.29*   < > 0.71*   < > 0.64  --   --  0.31 0.18*  CREATININE 0.91   < >  --   --  0.94  --  0.82  --   --   --   --  0.96  0.97  --   TROPONINIHS 13  --    < > 2,712*  --   --   --   --   --  682* 763*  --   --    < > = values in this interval not displayed.    Medications:  Per chart review, no prior anticoagulants at home.   Assessment: Pharmacy has been consulted for heparin dosing in patient who presented with chest pain that radiated to her left arm. The patient had respiratory failure on 5/6 during the cardiac cath procedure and was transferred to CCU. Currently H&H is low but stable and platelets are WNL  Heparin Course: 5/9 0123 HL 0.31  5/9 0903 HL 0.18   Goal of Therapy:  Heparin level 0.3-0.7 units/ml Monitor platelets by anticoagulation protocol: Yes   Plan:  Heparin level is subtherapeutic. Will give a 1000 unit bolus and increased heparin rate to 750 units/hr. CBC stable. CBC daily while on heparin. Heparin level  ordered in 8 hours.   Ronnald Ramp, PharmD, BCPS Clinical Pharmacist 12/31/2019 10:13 AM

## 2019-12-31 NOTE — Progress Notes (Signed)
Patient c/o left sided chest pain 8/10 that radiated up to her left jaw. Nitro SL administered x 2. Patient currently states her pain level is 1/10. Manuela Schwartz, NP notified. Will continue to monitor.

## 2019-12-31 NOTE — Consult Note (Signed)
PHARMACY CONSULT NOTE  Pharmacy Consult for Electrolyte Monitoring and Replacement   Recent Labs: Potassium (mmol/L)  Date Value  12/31/2019 3.7  12/31/2019 3.6  12/15/2014 4.2   Magnesium (mg/dL)  Date Value  57/09/2024 2.1   Calcium (mg/dL)  Date Value  69/16/7561 8.8 (L)  12/31/2019 8.8 (L)   Calcium, Total (mg/dL)  Date Value  25/48/3234 8.6 (L)   Albumin (g/dL)  Date Value  68/87/3730 3.7  12/14/2014 3.9   Phosphorus (mg/dL)  Date Value  81/68/3870 5.0 (H)   Sodium (mmol/L)  Date Value  12/31/2019 137  12/31/2019 137  12/15/2014 133 (L)    Assessment: 79 year-old female who presented with chest pain that radiated to her left arm, determined to be a STEMI. The patient had respiratory failure on 5/6 during the cardiac cath procedure and was transferred to CCU. She has been extubated.   Goal of Therapy:  Potassium 4.0 - 5.1 mmol/L Magnesium 2.0 - 2.4 mg/dL All Other Electrolytes WNL  Plan:   KCl 20 mEq x 1 dose   Next electrolytes in am  Ronnald Ramp, PharmD, BCPS Clinical Pharmacist 12/31/2019 7:43 AM

## 2019-12-31 NOTE — Progress Notes (Signed)
Patient B/P remained hypotensive after 1L bolus NS.  Notified physician and delivered 5mg  midodrine.  Waited approx and then began infusion of Levo at .  Patient B/P responded well to Normal limits.  Pressures have been saved in patient file.  Patient became alert and consumed her dinner meal.  She remains AOx4.

## 2019-12-31 NOTE — Progress Notes (Signed)
[facility] Encounter Note  Patient: Leah Small / Admit Date: 12/28/2019 / Date of Encounter: 12/31/2019, 6:08 AM   Subjective: Patient continues to be critical with acute cardiorespiratory failure which has now improved and patient has been extubated. Patient is Covid positive with likely inflammatory abnormalities by chest x-ray with possible mild amount of edema. Oxygen saturation currently good. Urine output significantly improved over the last few days. Troponin elevation consistent with myocardial infarction at 2712 and still evaluated to 763 after episode of chest pain. Patient also remains anemic without evidence of significant concerns of overt bleeding. Echocardiogram showing severe LV systolic dysfunction with ejection fraction of 15 to 20% after cardiac catheterization showing diffuse coronary artery disease with only a LIMA to the LAD patent which is unchanged from cardiac catheterization in 2017 with no amenable intervention. Blood pressure has been low normal with some concerns of addition of nitrate and/or Ranexa at this time  Review of Systems: Cannot assess   objective: Telemetry: Sinus tachycardia Physical Exam: Blood pressure (!) 155/65, pulse 71, temperature 98.1 F (36.7 C), temperature source Oral, resp. rate (!) 0, SpO2 100 %. There is no height or weight on file to calculate BMI.   As per medicine  Intake/Output Summary (Last 24 hours) at 12/31/2019 5374 Last data filed at 12/31/2019 0200 Gross per 24 hour  Intake 993.79 ml  Output 370 ml  Net 623.79 ml    Inpatient Medications:  . vitamin C  500 mg Per Tube Daily  . aspirin  81 mg Oral Daily  . brimonidine  1 drop Both Eyes BID  . brinzolamide  1 drop Both Eyes BID  . chlorhexidine gluconate (MEDLINE KIT)  15 mL Mouth Rinse BID  . Chlorhexidine Gluconate Cloth  6 each Topical Daily  . docusate  100 mg Oral BID  . doxycycline  100 mg Oral Q12H  . insulin aspart  0-20 Units Subcutaneous  Q4H  . lamoTRIgine  100 mg Per Tube Daily  . mouth rinse  15 mL Mouth Rinse 10 times per day  . nitroGLYCERIN      . polyethylene glycol  17 g Oral Daily  . sodium chloride flush  3 mL Intravenous Q12H  . timolol  1 drop Both Eyes BID  . zinc sulfate  220 mg Per Tube Daily   Infusions:  . sodium chloride 10 mL/hr at 12/31/19 0200  . sodium chloride 10 mL/hr at 12/29/19 0600  . famotidine (PEPCID) IV Stopped (12/30/19 2335)  . heparin 650 Units/hr (12/31/19 0200)  . levETIRAcetam Stopped (12/31/19 0129)  . remdesivir 100 mg in NS 100 mL Stopped (12/30/19 0952)    Labs: Recent Labs    12/30/19 0721 12/31/19 0123  NA 135 137  137  K 3.7 3.7  3.6  CL 103 108  108  CO2 19* 21*  22  GLUCOSE 144* 207*  183*  BUN 26* 40*  40*  CREATININE 0.82 0.96  0.97  CALCIUM 8.5* 8.8*  8.8*  MG 2.0 2.1  PHOS 5.0*  --    Recent Labs    12/30/19 0721 12/31/19 0123  AST 44* 30  ALT 35 29  ALKPHOS 64 61  BILITOT 0.7 0.5  PROT 7.4 7.3  ALBUMIN 3.7 3.7   Recent Labs    12/30/19 0721 12/31/19 0123  WBC 12.2* 9.1  NEUTROABS 10.5* 7.0  HGB 9.2* 8.6*  HCT 29.0* 27.6*  MCV 95.7 96.8  PLT 236 205   No results for  input(s): CKTOTAL, CKMB, TROPONINI in the last 72 hours. Invalid input(s): POCBNP Recent Labs    12/28/19 1601  HGBA1C 6.1*     Weights: There were no vitals filed for this visit.   Radiology/Studies:  DG Abd 1 View  Result Date: 12/28/2019 CLINICAL DATA:  Tube placement EXAM: ABDOMEN - 1 VIEW COMPARISON:  None. FINDINGS: The enteric tube projects over the gastric body. There is a large amount of stool in the partially visualized colon. The visualized bowel gas pattern is unremarkable. IMPRESSION: Enteric tube projects over the gastric body. Large stool burden in the partially visualized colon. Electronically Signed   By: Constance Holster M.D.   On: 12/28/2019 18:50   CARDIAC CATHETERIZATION  Result Date: 12/28/2019  Prox LAD lesion is 100% stenosed.  Dist  LAD lesion is 30% stenosed.  Non-stenotic Ost Ramus to Ramus lesion was previously treated.  Ramus lesion is 30% stenosed.  Mid Cx lesion is 100% stenosed.  Prox Cx lesion is 90% stenosed.  Prox RCA to Mid RCA lesion is 90% stenosed.  Mid RCA lesion is 100% stenosed.  LIMA. No significant disease with insertion into the mid LAD  Prox Graft lesion is 100% stenosed.  Prox Graft lesion is 100% stenosed.  Prox Graft lesion is 100% stenosed.  Conclusion STEMI presentation with cardiac cath Left heart cath with grafts intervention was deferred Multivessel native coronary disease moderate disease in left main totally occluded ostial LAD proximal circumflex ostial right Totally occluded SVG to RCA circumflex and diagonal LIMA to mid LAD was normal Left ventriculogram showed moderately depressed left ventricular function around 35% globally Unsuccessful minx FemoStop was placed Patient was intubated sedated Case was then transferred to ICU critical care team for management Conservative cardiac input at this point   DG Chest Port 1 View  Result Date: 12/29/2019 CLINICAL DATA:  Acute respiratory failure EXAM: PORTABLE CHEST 1 VIEW COMPARISON:  Radiograph 12/28/2019 FINDINGS: Low positioning of the endotracheal tube which approximates the orifice of the right mainstem bronchus, recommend retraction at least 3 cm to the mid trachea. Transesophageal tube tip below the GE junction, beyond the level of imaging. Telemetry leads overlie the chest. Mild hyperexpansion of the lungs with some central vascular congestion. Increasing prominence of the interstitium with some more hazy opacity in the right lung base. No visible pneumothorax or effusion. 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 No acute osseous or soft tissue abnormality. Degenerative changes are present in the imaged spine and shoulders. IMPRESSION: 1. Low positioning of the endotracheal tube which approximates the  orifice of the right mainstem bronchus. Recommend retraction at least 3 cm. 2. Satisfactory positioning of the transesophageal tube. 3. Increasing prominence of the interstitium with some more hazy opacity in the right lung base, could reflect developing edema or infection. These results will be called to the ordering clinician or representative by the Radiologist Assistant, and communication documented in the PACS or [employer]. Electronically Signed   By: Lovena Le M.D.   On: 12/29/2019 05:50   DG Chest Port 1 View  Result Date: 12/28/2019 CLINICAL DATA:  Dyspnea. EXAM: PORTABLE CHEST 1 VIEW COMPARISON:  12/17/2017 FINDINGS: The endotracheal tube terminates above the carina by approximately 2.6 cm. The enteric tube extends below the left hemidiaphragm. The heart size is mildly enlarged. The patient is status post prior median sternotomy. There is prominence of the pulmonary vasculature. There are atherosclerotic changes of the thoracic aorta. The lungs appear slightly hyperexpanded. There are  prominent interstitial lung markings. There is no definite acute osseous abnormality. IMPRESSION: 1. Lines and tubes as above. 2. Findings suggestive of developing interstitial edema. 3.  Aortic Atherosclerosis (ICD10-I70.0). Electronically Signed   By: Constance Holster M.D.   On: 12/28/2019 18:50   ECHOCARDIOGRAM COMPLETE  Result Date: 12/29/2019    ECHOCARDIOGRAM REPORT   Patient Name:   Leah Small Date of Exam: 12/29/2019 Medical Rec #:  [id number]       Height:       68.0 in Accession #:    [id number]      Weight:       122.0 lb Date of Birth:  1941/06/22       BSA:          1.657 m Patient Age:    79 years        BP:           111/61 mmHg Patient Gender: F               HR:           74 bpm. Exam Location:  [facility] Procedure: 2D Echo, Color Doppler and Cardiac Doppler Indications:     I21.3 STEMI  History:         Patient has prior history of Echocardiogram examinations. CAD,                  TIA; Risk  Factors:Diabetes and HCL.  Sonographer:     Charmayne Sheer RDCS (AE) Referring Phys:  [id number] [facility] D CALLWOOD Diagnosing Phys: Serafina Royals MD  Sonographer Comments: Suboptimal apical window, suboptimal subcostal window and echo performed with patient supine and on artificial respirator. IMPRESSIONS  1. Left ventricular ejection fraction, by estimation, is <20%. The left ventricle has severely decreased function. The left ventricle demonstrates global hypokinesis. The left ventricular internal cavity size was mildly to moderately dilated. Left ventricular diastolic parameters are consistent with Grade I diastolic dysfunction (impaired relaxation).  2. Right ventricular systolic function is normal. The right ventricular size is normal. There is normal pulmonary artery systolic pressure.  3. Left atrial size was mildly dilated.  4. Right atrial size was mildly dilated.  5. The mitral valve is normal in structure. Mild to moderate mitral valve regurgitation.  6. The aortic valve is normal in structure. Aortic valve regurgitation is trivial. FINDINGS  Left Ventricle: Left ventricular ejection fraction, by estimation, is <20%. The left ventricle has severely decreased function. The left ventricle demonstrates global hypokinesis. The left ventricular internal cavity size was mildly to moderately dilated. There is borderline left ventricular hypertrophy. Left ventricular diastolic parameters are consistent with Grade I diastolic dysfunction (impaired relaxation). Right Ventricle: The right ventricular size is normal. No increase in right ventricular wall thickness. Right ventricular systolic function is normal. There is normal pulmonary artery systolic pressure. The tricuspid regurgitant velocity is 2.19 m/s, and  with an assumed right atrial pressure of 10 mmHg, the estimated right ventricular systolic pressure is 40.8 mmHg. Left Atrium: Left atrial size was mildly dilated. Right Atrium: Right atrial size was mildly  dilated. Pericardium: There is no evidence of pericardial effusion. Mitral Valve: The mitral valve is normal in structure. Mild to moderate mitral valve regurgitation. MV peak gradient, 2.4 mmHg. The mean mitral valve gradient is 1.0 mmHg. Tricuspid Valve: The tricuspid valve is normal in structure. Tricuspid valve regurgitation is mild. Aortic Valve: The aortic valve is normal in structure. Aortic valve regurgitation is trivial. Aortic valve mean gradient  measures 1.0 mmHg. Aortic valve peak gradient measures 2.5 mmHg. Aortic valve area, by VTI measures 1.92 cm. Pulmonic Valve: The pulmonic valve was normal in structure. Pulmonic valve regurgitation is not visualized. Aorta: The aortic root and ascending aorta are structurally normal, with no evidence of dilitation. IAS/Shunts: No atrial level shunt detected by color flow Doppler.  LEFT VENTRICLE PLAX 2D LVIDd:         4.98 cm  Diastology LVIDs:         4.43 cm  LV e' lateral:   5.11 cm/s LV PW:         0.83 cm  LV E/e' lateral: 11.4 LV IVS:        0.79 cm  LV e' medial:    3.05 cm/s LVOT diam:     1.60 cm  LV E/e' medial:  19.1 LV SV:         20 LV SV Index:   12 LVOT Area:     2.01 cm  LEFT ATRIUM         Index LA diam:    3.30 cm 1.99 cm/m  AORTIC VALVE                   PULMONIC VALVE AV Area (Vmax):    1.67 cm    PV Vmax:       0.69 m/s AV Area (Vmean):   1.87 cm    PV Vmean:      47.600 cm/s AV Area (VTI):     1.92 cm    PV VTI:        0.101 m AV Vmax:           79.20 cm/s  PV Peak grad:  1.9 mmHg AV Vmean:          47.000 cm/s PV Mean grad:  1.0 mmHg AV VTI:            0.106 m AV Peak Grad:      2.5 mmHg AV Mean Grad:      1.0 mmHg LVOT Vmax:         65.80 cm/s LVOT Vmean:        43.700 cm/s LVOT VTI:          0.101 m LVOT/AV VTI ratio: 0.95  AORTA Ao Root diam: 2.40 cm MITRAL VALVE               TRICUSPID VALVE MV Area (PHT): 3.91 cm    TR Peak grad:   19.2 mmHg MV Peak grad:  2.4 mmHg    TR Vmax:        219.00 cm/s MV Mean grad:  1.0 mmHg MV Vmax:        0.78 m/s    SHUNTS MV Vmean:      48.7 cm/s   Systemic VTI:  0.10 m MV Decel Time: 194 msec    Systemic Diam: 1.60 cm MV E velocity: 58.20 cm/s MV A velocity: 34.90 cm/s MV E/A ratio:  1.67 Serafina Royals MD Electronically signed by Serafina Royals MD Signature Date/Time: 12/29/2019/3:56:35 PM    Final      Assessment and Recommendation  79 y.o. female with known three-vessel coronary artery disease hypertension hyperlipidemia with chronic anginal symptoms having acute cardio respiratory failure multifactorial in nature including acute myocardial infarction with Covid infection possibly causing worsening inflammatory lung disease with no coronary artery disease amenable to intervention at this time and severe LV systolic dysfunction due to above slightly more  stable over the last 24 hours 1.  Continue supportive care for respiratory failure and infection with current evidence of good oxygenation 2.  No further cardiac intervention and/or diagnostics necessary at this time due to no evidence of amenable PCI and/or stent placement 3.  Continue heparin for further risk reduction of deep venous thrombosis as well as myocardial infarction treatment but will discontinue Plavix due to concerns of anemia and possible bleeding complications exacerbating above.  It is possible chest discomfort may also be related to low hemoglobin 4. Further consideration of Lasix depending on chest x-ray today but urine output appears to be relatively stable with no acute kidney failure 5.  Reinstatement of Ranexa isosorbide when able although currently appears the blood pressure may be of concern.  Could potentially use topical nitrate as well Signed, Serafina Royals M.D. FACC

## 2019-12-31 NOTE — Progress Notes (Addendum)
Patient presents lethargic upon noon assessment.  This is a change from this mornings baseline of AOx4.  Notified physician of B/P extreme Hypotension with MAP of 51 following multiple readings on both arms.  Bolus 1L normal saline.  Set monitor to q76min B/P checks.  Patient sitting upright in bed.

## 2019-12-31 NOTE — Consult Note (Signed)
ANTICOAGULATION CONSULT NOTE  Pharmacy Consult for Heparin  Indication: ACS / STEMI  Patient Measurements:   Heparin Dosing Weight: 55.3 kg   Vital Signs: Temp: 97.6 F (36.4 C) (05/08 1600) Temp Source: Oral (05/08 1600) BP: 157/70 (05/09 0200) Pulse Rate: 79 (05/09 0200)  Labs: Recent Labs    12/28/19 1601 12/28/19 1601 12/28/19 1822 12/28/19 2321 12/29/19 0106 12/29/19 1009 12/30/19 0721 12/30/19 1720 12/30/19 2121 12/30/19 2233 12/31/19 0123  HGB 8.2*   < >  --   --  8.0*  --  9.2*  --   --   --  8.6*  HCT 26.5*   < >  --   --  25.5*  --  29.0*  --   --   --  27.6*  PLT 310   < >  --   --  224  --  236  --   --   --  205  APTT >160*  --   --   --   --   --   --   --   --   --   --   LABPROT 14.7  --   --   --   --   --   --   --   --   --   --   INR 1.2  --   --   --   --   --   --   --   --   --   --   HEPARINUNFRC  --   --   --   --  0.29*   < > 0.71* 0.64  --   --  0.31  CREATININE 0.91   < >  --   --  0.94  --  0.82  --   --   --  0.96  0.97  TROPONINIHS 13  --    < > 2,712*  --   --   --   --  682* 763*  --    < > = values in this interval not displayed.    Medications:  Per chart review, no prior anticoagulants at home.   Assessment: Pharmacy has been consulted for heparin dosing in patient who presented with chest pain that radiated to her left arm. The patient had respiratory failure on 5/6 during the cardiac cath procedure and was transferred to CCU. She is currently intubated. Currently H&H is low but stable and platelets are WNL  Heparin Course: 5/6 initiation: 3300 unit bolus then 650 units/hr 5/7 0106 HL 0.29: inc to 800 units/hr 5/7 1009 HL 0.67: dec to 750 units/hr 05/07 @ 2330 HL 0.43 therapeutic.  Goal of Therapy:  Heparin level 0.3-0.7 units/ml Monitor platelets by anticoagulation protocol: Yes   Plan:  05/09 @ 0130 HL 0.31 therapeutic, but trending down will recheck HL at 0900 to ensure patient doesn't fall subtherapeutic. H/h low  stable will continue to monitor.  Thomasene Ripple, PharmD Clinical Pharmacist 12/31/2019 3:43 AM

## 2019-12-31 NOTE — Progress Notes (Signed)
PROGRESS NOTE    Leah Small  TOI:[id number] DOB: [birth year] DOA: 12/28/2019 PCP: Idelle Crouch, MD       Assessment & Plan:   Active Problems:   STEMI (ST elevation myocardial infarction) ([place])   Pressure ulcer   COVID19 pneumonia: continue on IV remdesivir, vit c and zinc. D/c steroids secondary to STEMI & poorly controlled DM2. Encourage incentive spirometry   STEMI: s/p cardiac cath but unable to place stents. Continue w/ medical management as per cardio. Continue on IV heparin. Will restart ranexa. Nitro prn. Cardio following and recs apprec  DM2: poorly controlled. Continue on SSI w/ accuchecks   GERD: continue on famotidine   Seizures: continue on keppra, lamictal   Normocytic anemia: no need for a transfusion at this time. Will continue to monitor    DVT prophylaxis: IV heparin  Code Status: DNR Family Communication:  Disposition Plan: depends on PT/OT recs   Consultants:   Cardio    Procedures:    Antimicrobials: empiric doxycycline    Subjective: Pt c/o malaise   Objective: Vitals:   12/31/19 0300 12/31/19 0400 12/31/19 0500 12/31/19 0600  BP: (!) 136/58 (!) 159/65 (!) 155/65 (!) 167/77  Pulse: 70 77 71 85  Resp: 17 18 (!) 0 (!) 23  Temp:      TempSrc:      SpO2: 100% 100% 100% 100%    Intake/Output Summary (Last 24 hours) at 12/31/2019 0805 Last data filed at 12/31/2019 0644 Gross per 24 hour  Intake 1046.87 ml  Output 250 ml  Net 796.87 ml   There were no vitals filed for this visit.  Examination:  General exam: Appears calm and comfortable. Frail appearing  Respiratory system: Clear to auscultation. No wheezes, rales Cardiovascular system: S1 & S2 + No rubs, gallops or clicks.  Gastrointestinal system: Abdomen is nondistended, soft and nontender.  Normal bowel sounds heard. Central nervous system: Alert and oriented. Moves all 4 extremities  Psychiatry: Judgement and insight appear normal. Flat mood and affect.     Data  Reviewed: I have personally reviewed following labs and imaging studies  CBC: Recent Labs  Lab 12/28/19 1601 12/29/19 0106 12/30/19 0721 12/31/19 0123  WBC 12.1* 9.3 12.2* 9.1  NEUTROABS 10.1* 8.4* 10.5* 7.0  HGB 8.2* 8.0* 9.2* 8.6*  HCT 26.5* 25.5* 29.0* 27.6*  MCV 98.5 95.9 95.7 96.8  PLT 310 224 236 833   Basic Metabolic Panel: Recent Labs  Lab 12/28/19 1601 12/29/19 0106 12/29/19 1009 12/30/19 0721 12/31/19 0123  NA 134* 133*  --  135 137  137  K 5.1 3.4*  --  3.7 3.7  3.6  CL 105 100  --  103 108  108  CO2 18* 19*  --  19* 21*  22  GLUCOSE 256* 228*  --  144* 207*  183*  BUN 23 25*  --  26* 40*  40*  CREATININE 0.91 0.94  --  0.82 0.96  0.97  CALCIUM 9.1 8.9  --  8.5* 8.8*  8.8*  MG  --   --  1.4* 2.0 2.1  PHOS  --   --   --  5.0*  --    GFR: CrCl cannot be calculated (Unknown ideal weight.). Liver Function Tests: Recent Labs  Lab 12/28/19 1601 12/29/19 0106 12/30/19 0721 12/31/19 0123  AST 29 48* 44* 30  ALT 23 31 35 29  ALKPHOS 73 73 64 61  BILITOT 0.9 0.9 0.7 0.5  PROT 7.9 7.8 7.4 7.3  ALBUMIN 4.4 4.0 3.7 3.7   No results for input(s): LIPASE, AMYLASE in the last 168 hours. No results for input(s): AMMONIA in the last 168 hours. Coagulation Profile: Recent Labs  Lab 12/28/19 1601  INR 1.2   Cardiac Enzymes: No results for input(s): CKTOTAL, CKMB, CKMBINDEX, TROPONINI in the last 168 hours. BNP (last 3 results) No results for input(s): PROBNP in the last 8760 hours. HbA1C: Recent Labs    12/28/19 1601  HGBA1C 6.1*   CBG: Recent Labs  Lab 12/30/19 1601 12/30/19 1930 12/30/19 2352 12/31/19 0403 12/31/19 0721  GLUCAP 299* 272* 226* 116* 84   Lipid Profile: Recent Labs    12/28/19 1601 12/28/19 1822  CHOL 137  --   HDL 44  --   LDLCALC 68  --   TRIG 127 72  CHOLHDL 3.1  --    Thyroid Function Tests: No results for input(s): TSH, T4TOTAL, FREET4, T3FREE, THYROIDAB in the last 72 hours. Anemia Panel: Recent Labs     12/28/19 2016  FERRITIN 17   Sepsis Labs: No results for input(s): PROCALCITON, LATICACIDVEN in the last 168 hours.  Recent Results (from the past 240 hour(s))  Respiratory Panel by RT PCR (Flu A&B, Covid) - Nasopharyngeal Swab     Status: Abnormal   Collection Time: 12/28/19  4:01 PM   Specimen: Nasopharyngeal Swab  Result Value Ref Range Status   SARS Coronavirus 2 by RT PCR POSITIVE (A) NEGATIVE Final    Comment: RESULT CALLED TO, READ BACK BY AND VERIFIED WITH: DR.COVERT(CATH) AT 1707 ON 12/28/2019 BY MOSLEY,J (NOTE) SARS-CoV-2 target nucleic acids are DETECTED. SARS-CoV-2 RNA is generally detectable in upper respiratory specimens  during the acute phase of infection. Positive results are indicative of the presence of the identified virus, but do not rule out bacterial infection or co-infection with other pathogens not detected by the test. Clinical correlation with patient history and other diagnostic information is necessary to determine patient infection status. The expected result is Negative. Fact Sheet for Patients:  PinkCheek.be Fact Sheet for Healthcare Providers: GravelBags.it This test is not yet approved or cleared by the [place] FDA and  has been authorized for detection and/or diagnosis of SARS-CoV-2 by FDA under an Emergency Use Authorization (EUA).  This EUA will remain in effect (meaning this test ca n be used) for the duration of  the COVID-19 declaration under Section 564(b)(1) of the Act, 21 U.S.C. section 360bbb-3(b)(1), unless the authorization is terminated or revoked sooner.    Influenza A by PCR NEGATIVE NEGATIVE Final   Influenza B by PCR NEGATIVE NEGATIVE Final    Comment: (NOTE) The Xpert Xpress SARS-CoV-2/FLU/RSV assay is intended as an aid in  the diagnosis of influenza from Nasopharyngeal swab specimens and  should not be used as a sole basis for treatment. Nasal washings and   aspirates are unacceptable for Xpert Xpress SARS-CoV-2/FLU/RSV  testing. Fact Sheet for Patients: PinkCheek.be Fact Sheet for Healthcare Providers: GravelBags.it This test is not yet approved or cleared by the [place] FDA and  has been authorized for detection and/or diagnosis of SARS-CoV-2 by  FDA under an Emergency Use Authorization (EUA). This EUA will remain  in effect (meaning this test can be used) for the duration of the  Covid-19 declaration under Section 564(b)(1) of the Act, 21  U.S.C. section 360bbb-3(b)(1), unless the authorization is  terminated or revoked. Performed at [facility], [address] [place], [place] [address]   MRSA PCR Screening  Status: None   Collection Time: 12/28/19  6:16 PM   Specimen: Urine, Catheterized; Nasopharyngeal  Result Value Ref Range Status   MRSA by PCR NEGATIVE NEGATIVE Final    Comment:        The GeneXpert MRSA Assay (FDA approved for NASAL specimens only), is one component of a comprehensive MRSA colonization surveillance program. It is not intended to diagnose MRSA infection nor to guide or monitor treatment for MRSA infections. Performed at [facility], [address], [place], [place] [address]          Radiology Studies: ECHOCARDIOGRAM COMPLETE  Result Date: 12/29/2019    ECHOCARDIOGRAM REPORT   Patient Name:   Leah Small Date of Exam: 12/29/2019 Medical Rec #:  [id number]       Height:       68.0 in Accession #:    [id number]      Weight:       122.0 lb Date of Birth:  [birth year]       BSA:          1.657 m Patient Age:    79 years        BP:           111/61 mmHg Patient Gender: F               HR:           74 bpm. Exam Location:  [facility] Procedure: 2D Echo, Color Doppler and Cardiac Doppler Indications:     I21.3 STEMI  History:         Patient has prior history of Echocardiogram examinations. CAD,                  TIA; Risk  Factors:Diabetes and HCL.  Sonographer:     Charmayne Sheer RDCS (AE) Referring Phys:  [id number] [facility] D CALLWOOD Diagnosing Phys: Serafina Royals MD  Sonographer Comments: Suboptimal apical window, suboptimal subcostal window and echo performed with patient supine and on artificial respirator. IMPRESSIONS  1. Left ventricular ejection fraction, by estimation, is <20%. The left ventricle has severely decreased function. The left ventricle demonstrates global hypokinesis. The left ventricular internal cavity size was mildly to moderately dilated. Left ventricular diastolic parameters are consistent with Grade I diastolic dysfunction (impaired relaxation).  2. Right ventricular systolic function is normal. The right ventricular size is normal. There is normal pulmonary artery systolic pressure.  3. Left atrial size was mildly dilated.  4. Right atrial size was mildly dilated.  5. The mitral valve is normal in structure. Mild to moderate mitral valve regurgitation.  6. The aortic valve is normal in structure. Aortic valve regurgitation is trivial. FINDINGS  Left Ventricle: Left ventricular ejection fraction, by estimation, is <20%. The left ventricle has severely decreased function. The left ventricle demonstrates global hypokinesis. The left ventricular internal cavity size was mildly to moderately dilated. There is borderline left ventricular hypertrophy. Left ventricular diastolic parameters are consistent with Grade I diastolic dysfunction (impaired relaxation). Right Ventricle: The right ventricular size is normal. No increase in right ventricular wall thickness. Right ventricular systolic function is normal. There is normal pulmonary artery systolic pressure. The tricuspid regurgitant velocity is 2.19 m/s, and  with an assumed right atrial pressure of 10 mmHg, the estimated right ventricular systolic pressure is 48.1 mmHg. Left Atrium: Left atrial size was mildly dilated. Right Atrium: Right atrial size was mildly  dilated. Pericardium: There is no evidence of pericardial effusion. Mitral Valve: The mitral valve  is normal in structure. Mild to moderate mitral valve regurgitation. MV peak gradient, 2.4 mmHg. The mean mitral valve gradient is 1.0 mmHg. Tricuspid Valve: The tricuspid valve is normal in structure. Tricuspid valve regurgitation is mild. Aortic Valve: The aortic valve is normal in structure. Aortic valve regurgitation is trivial. Aortic valve mean gradient measures 1.0 mmHg. Aortic valve peak gradient measures 2.5 mmHg. Aortic valve area, by VTI measures 1.92 cm. Pulmonic Valve: The pulmonic valve was normal in structure. Pulmonic valve regurgitation is not visualized. Aorta: The aortic root and ascending aorta are structurally normal, with no evidence of dilitation. IAS/Shunts: No atrial level shunt detected by color flow Doppler.  LEFT VENTRICLE PLAX 2D LVIDd:         4.98 cm  Diastology LVIDs:         4.43 cm  LV e' lateral:   5.11 cm/s LV PW:         0.83 cm  LV E/e' lateral: 11.4 LV IVS:        0.79 cm  LV e' medial:    3.05 cm/s LVOT diam:     1.60 cm  LV E/e' medial:  19.1 LV SV:         20 LV SV Index:   12 LVOT Area:     2.01 cm  LEFT ATRIUM         Index LA diam:    3.30 cm 1.99 cm/m  AORTIC VALVE                   PULMONIC VALVE AV Area (Vmax):    1.67 cm    PV Vmax:       0.69 m/s AV Area (Vmean):   1.87 cm    PV Vmean:      47.600 cm/s AV Area (VTI):     1.92 cm    PV VTI:        0.101 m AV Vmax:           79.20 cm/s  PV Peak grad:  1.9 mmHg AV Vmean:          47.000 cm/s PV Mean grad:  1.0 mmHg AV VTI:            0.106 m AV Peak Grad:      2.5 mmHg AV Mean Grad:      1.0 mmHg LVOT Vmax:         65.80 cm/s LVOT Vmean:        43.700 cm/s LVOT VTI:          0.101 m LVOT/AV VTI ratio: 0.95  AORTA Ao Root diam: 2.40 cm MITRAL VALVE               TRICUSPID VALVE MV Area (PHT): 3.91 cm    TR Peak grad:   19.2 mmHg MV Peak grad:  2.4 mmHg    TR Vmax:        219.00 cm/s MV Mean grad:  1.0 mmHg MV Vmax:        0.78 m/s    SHUNTS MV Vmean:      48.7 cm/s   Systemic VTI:  0.10 m MV Decel Time: 194 msec    Systemic Diam: 1.60 cm MV E velocity: 58.20 cm/s MV A velocity: 34.90 cm/s MV E/A ratio:  1.67 Serafina Royals MD Electronically signed by Serafina Royals MD Signature Date/Time: 12/29/2019/3:56:35 PM    Final         Scheduled Meds: .  vitamin C  500 mg Oral Daily  . aspirin  81 mg Oral Daily  . brimonidine  1 drop Both Eyes BID  . brinzolamide  1 drop Both Eyes BID  . chlorhexidine gluconate (MEDLINE KIT)  15 mL Mouth Rinse BID  . Chlorhexidine Gluconate Cloth  6 each Topical Daily  . docusate  100 mg Oral BID  . doxycycline  100 mg Oral Q12H  . insulin aspart  0-20 Units Subcutaneous Q4H  . lamoTRIgine  100 mg Oral Daily  . nitroGLYCERIN      . polyethylene glycol  17 g Oral Daily  . potassium chloride  20 mEq Oral Once  . sodium chloride flush  3 mL Intravenous Q12H  . timolol  1 drop Both Eyes BID  . zinc sulfate  220 mg Oral Daily   Continuous Infusions: . sodium chloride 10 mL/hr at 12/31/19 0644  . sodium chloride 10 mL/hr at 12/29/19 0600  . famotidine (PEPCID) IV Stopped (12/30/19 2335)  . heparin 650 Units/hr (12/31/19 0644)  . levETIRAcetam Stopped (12/31/19 0129)  . remdesivir 100 mg in NS 100 mL Stopped (12/30/19 0952)     LOS: 3 days    Time spent: 32 mins     Wyvonnia Dusky, MD [facility] Pager [phone]  If 7PM-7AM, please contact night-coverage [email or web address] 12/31/2019, 8:05 AM

## 2020-01-01 ENCOUNTER — Telehealth: Payer: Self-pay

## 2020-01-01 ENCOUNTER — Inpatient Hospital Stay: Payer: Medicare Other | Admitting: Oncology

## 2020-01-01 DIAGNOSIS — I959 Hypotension, unspecified: Secondary | ICD-10-CM

## 2020-01-01 LAB — CBC WITH DIFFERENTIAL/PLATELET
Abs Immature Granulocytes: 0.05 10*3/uL (ref 0.00–0.07)
Basophils Absolute: 0 10*3/uL (ref 0.0–0.1)
Basophils Relative: 0 %
Eosinophils Absolute: 0.1 10*3/uL (ref 0.0–0.5)
Eosinophils Relative: 1 %
HCT: 25 % — ABNORMAL LOW (ref 36.0–46.0)
Hemoglobin: 7.7 g/dL — ABNORMAL LOW (ref 12.0–15.0)
Immature Granulocytes: 1 %
Lymphocytes Relative: 19 %
Lymphs Abs: 1.5 10*3/uL (ref 0.7–4.0)
MCH: 30.2 pg (ref 26.0–34.0)
MCHC: 30.8 g/dL (ref 30.0–36.0)
MCV: 98 fL (ref 80.0–100.0)
Monocytes Absolute: 0.9 10*3/uL (ref 0.1–1.0)
Monocytes Relative: 12 %
Neutro Abs: 5.1 10*3/uL (ref 1.7–7.7)
Neutrophils Relative %: 67 %
Platelets: 185 10*3/uL (ref 150–400)
RBC: 2.55 MIL/uL — ABNORMAL LOW (ref 3.87–5.11)
RDW: 17.6 % — ABNORMAL HIGH (ref 11.5–15.5)
WBC: 7.6 10*3/uL (ref 4.0–10.5)
nRBC: 0 % (ref 0.0–0.2)

## 2020-01-01 LAB — COMPREHENSIVE METABOLIC PANEL
ALT: 19 U/L (ref 0–44)
AST: 22 U/L (ref 15–41)
Albumin: 3.1 g/dL — ABNORMAL LOW (ref 3.5–5.0)
Alkaline Phosphatase: 62 U/L (ref 38–126)
Anion gap: 8 (ref 5–15)
BUN: 33 mg/dL — ABNORMAL HIGH (ref 8–23)
CO2: 19 mmol/L — ABNORMAL LOW (ref 22–32)
Calcium: 8.3 mg/dL — ABNORMAL LOW (ref 8.9–10.3)
Chloride: 111 mmol/L (ref 98–111)
Creatinine, Ser: 0.82 mg/dL (ref 0.44–1.00)
GFR calc Af Amer: >60 mL/min (ref >60)
GFR calc non Af Amer: >60 mL/min (ref >60)
Glucose, Bld: 89 mg/dL (ref 70–99)
Potassium: 3.8 mmol/L (ref 3.5–5.1)
Sodium: 138 mmol/L (ref 135–145)
Total Bilirubin: 0.7 mg/dL (ref 0.3–1.2)
Total Protein: 6.2 g/dL — ABNORMAL LOW (ref 6.5–8.1)

## 2020-01-01 LAB — GLUCOSE, CAPILLARY
Glucose-Capillary: 76 mg/dL (ref 70–99)
Glucose-Capillary: 85 mg/dL (ref 70–99)
Glucose-Capillary: 228 mg/dL — ABNORMAL HIGH (ref 70–99)
Glucose-Capillary: 209 mg/dL — ABNORMAL HIGH (ref 70–99)

## 2020-01-01 LAB — FIBRIN DERIVATIVES D-DIMER (ARMC ONLY): Fibrin derivatives D-dimer (ARMC): 425.53 ng/mL (FEU) (ref 0.00–499.00)

## 2020-01-01 LAB — HEPARIN LEVEL (UNFRACTIONATED): Heparin Unfractionated: 0.47 IU/mL (ref 0.30–0.70)

## 2020-01-01 LAB — PHOSPHORUS: Phosphorus: 3.2 mg/dL (ref 2.5–4.6)

## 2020-01-01 LAB — FIBRINOGEN: Fibrinogen: 386 mg/dL (ref 210–475)

## 2020-01-01 LAB — C-REACTIVE PROTEIN: CRP: 4 mg/dL — ABNORMAL HIGH (ref <1.0)

## 2020-01-01 MED ORDER — SODIUM CHLORIDE 0.9 % IV BOLUS
1000.0000 mL | Freq: Once | INTRAVENOUS | Status: AC
  Administered 2020-01-01: 1000 mL via INTRAVENOUS

## 2020-01-01 NOTE — TOC Initial Note (Addendum)
Transition of Care [facility]) - Initial/Assessment Note    Patient Details  Name: Leah Small MRN: [id number] Date of Birth: 07-28-1941  [facility] CM/SW Contact:    Magnus Ivan, LCSW Phone Number: 01/01/2020, 11:19 AM  Clinical Narrative:               Patient is on airborne precautions. CSW called to patient's room and spoke with patient. Patient reported she lives in her home with her adult son Leah Small) and his girlfriend. Patient reported her other son, Leah Small, is very involved and supportive and takes her to appointments, manages her care, etc. Patient stated she has a cane and a rollator. PCP is Dr. Doy Hutching. Pharmacy is [facility]. Patient reported she has been to a SNF in the past (she thinks it was [facility]) and had [facility] services in the past (unsure of which agency she used). CSW informed patient that PT and OT are recommending SNF at discharge. Patient declined. Patient also declined Home Health. Patient reported she does not feel she needs additional support when she goes home. CSW asked if patient would like CSW to talk with Leah Small about this as well, patient said no. Patient agreed to reach out if she changes her mind or does have any needs before discharge.    CSW will continue to follow.    Expected Discharge Plan: Home/Self Care Barriers to Discharge: Continued Medical Work up   Patient Goals and CMS Choice Patient states their goals for this hospitalization and ongoing recovery are:: to return home   Choice offered to / list presented to : Patient  Expected Discharge Plan and Services Expected Discharge Plan: Home/Self Care       Living arrangements for the past 2 months: Single Family Home                                      Prior Living Arrangements/Services Living arrangements for the past 2 months: Single Family Home Lives with:: Adult Children Patient language and need for interpreter reviewed:: Yes Do you feel safe going back to the  place where you live?: Yes      Need for Family Participation in Patient Care: Yes (Comment) Care giver support system in place?: Yes (comment) Current home services: DME Criminal Activity/Legal Involvement Pertinent to Current Situation/Hospitalization: No - Comment as needed  Activities of Daily Living      Permission Sought/Granted                  Emotional Assessment   Attitude/Demeanor/Rapport: Engaged, Gracious Affect (typically observed): Calm, Appropriate Orientation: : Oriented to Self, Oriented to Place, Oriented to  Time, Oriented to Situation Alcohol / Substance Use: Not Applicable Psych Involvement: No (comment)  Admission diagnosis:  ST elevation myocardial infarction (STEMI), unspecified artery ([place]) [I21.3] STEMI (ST elevation myocardial infarction) [facility]) [I21.3] Patient Active Problem List   Diagnosis Date Noted  . Pressure ulcer 12/30/2019  . STEMI (ST elevation myocardial infarction) ([place]) 12/28/2019  . Malnutrition of moderate degree 08/12/2017  . Hypotension   . Carotid stenosis 11/12/2016  . Hypertensive heart disease 01/29/2016  . Normocytic anemia 01/29/2016  . History of vaginal bleeding 01/29/2016  . GERD (gastroesophageal reflux disease) 01/29/2016  . NSTEMI (non-ST elevated myocardial infarction) ([place]) 10/22/2015  . Non-STEMI (non-ST elevated myocardial infarction) ([place]) 10/22/2015  . 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 Coronary atherosclerosis of native  coronary artery 10/22/2015  . Angina pectoris (H[facility])   . Memory deficit   . Stroke (HCC) 07/06/2015  . Diabetes mellitus with complication (HCC) 07/06/2015  . Essential hypertension 07/06/2015  . Hyperlipidemia 07/06/2015  . Left-sided weakness    PCP:  Marguarite Arbour, MD Pharmacy:   [facility], [place] - [address] [place] [place] [address] Phone: [phone] Fax: [phone]     Social Determinants of Health (SDOH) Interventions     Readmission Risk Interventions No flowsheet data found.

## 2020-01-01 NOTE — Progress Notes (Signed)
PROGRESS NOTE    Leah Small  TKW:[id number] DOB: 07/19/1941 DOA: 12/28/2019 PCP: Idelle Crouch, MD       Assessment & Plan:   Active Problems:   STEMI (ST elevation myocardial infarction) ([place])   Pressure ulcer   COVID19 pneumonia: continue on IV remdesivir, vit c and zinc. D/c steroids secondary to STEMI & poorly controlled DM2. Encourage incentive spirometry   STEMI: s/p cardiac cath but unable to place stents. Continue w/ medical management as per cardio. Continue on IV heparin as per cardio. Will continue on ranexa. Nitro prn. Cardio following and recs apprec  Hypotension: resolved. Weaned off of levophed overnight. Will restart home dose of ranexa. Keep MAP >65   DM2: poorly controlled. Continue on SSI w/ accuchecks   GERD: continue on famotidine   Seizures: continue on keppra, lamictal   Normocytic anemia: no need for a transfusion at this time but H&H is trending down. Will continue to monitor    DVT prophylaxis: IV heparin  Code Status: DNR Family Communication:  Disposition Plan: depends on PT/OT recs   Consultants:   Cardio    Procedures:    Antimicrobials: empiric doxycycline    Subjective: Pt c/o feeling cold and fatigue  Objective: Vitals:   12/31/19 1800 12/31/19 1815 12/31/19 2013 01/01/20 0016  BP: (!) 131/46 (!) 123/45    Pulse:      Resp: (!) 0 16    Temp:   (!) 97.3 F (36.3 C) (!) 97.5 F (36.4 C)  TempSrc:   Oral Oral  SpO2:        Intake/Output Summary (Last 24 hours) at 01/01/2020 0813 Last data filed at 01/01/2020 0600 Gross per 24 hour  Intake 1420.67 ml  Output 625 ml  Net 795.67 ml   There were no vitals filed for this visit.  Examination:  General exam: Appears calm and comfortable. Frail appearing  Respiratory system: decreased breath sounds b/l. No rales  Cardiovascular system: S1 & S2 + No rubs, gallops or clicks.  Gastrointestinal system: Abdomen is nondistended, soft and nontender.  Normal bowel  sounds heard. Central nervous system: Alert and oriented. Moves all 4 extremities  Psychiatry: Judgement and insight appear normal. Flat mood and affect.     Data Reviewed: I have personally reviewed following labs and imaging studies  CBC: Recent Labs  Lab 12/28/19 1601 12/29/19 0106 12/30/19 0721 12/31/19 0123 01/01/20 0441  WBC 12.1* 9.3 12.2* 9.1 7.6  NEUTROABS 10.1* 8.4* 10.5* 7.0 5.1  HGB 8.2* 8.0* 9.2* 8.6* 7.7*  HCT 26.5* 25.5* 29.0* 27.6* 25.0*  MCV 98.5 95.9 95.7 96.8 98.0  PLT 310 224 236 205 924   Basic Metabolic Panel: Recent Labs  Lab 12/28/19 1601 12/29/19 0106 12/29/19 1009 12/30/19 0721 12/31/19 0123 01/01/20 0441  NA 134* 133*  --  135 137  137 138  K 5.1 3.4*  --  3.7 3.7  3.6 3.8  CL 105 100  --  103 108  108 111  CO2 18* 19*  --  19* 21*  22 19*  GLUCOSE 256* 228*  --  144* 207*  183* 89  BUN 23 25*  --  26* 40*  40* 33*  CREATININE 0.91 0.94  --  0.82 0.96  0.97 0.82  CALCIUM 9.1 8.9  --  8.5* 8.8*  8.8* 8.3*  MG  --   --  1.4* 2.0 2.1  --   PHOS  --   --   --  5.0*  --  3.2   GFR: CrCl cannot be calculated (Unknown ideal weight.). Liver Function Tests: Recent Labs  Lab 12/28/19 1601 12/29/19 0106 12/30/19 0721 12/31/19 0123 01/01/20 0441  AST 29 48* 44* 30 22  ALT 23 31 35 29 19  ALKPHOS 73 73 64 61 62  BILITOT 0.9 0.9 0.7 0.5 0.7  PROT 7.9 7.8 7.4 7.3 6.2*  ALBUMIN 4.4 4.0 3.7 3.7 3.1*   No results for input(s): LIPASE, AMYLASE in the last 168 hours. No results for input(s): AMMONIA in the last 168 hours. Coagulation Profile: Recent Labs  Lab 12/28/19 1601  INR 1.2   Cardiac Enzymes: No results for input(s): CKTOTAL, CKMB, CKMBINDEX, TROPONINI in the last 168 hours. BNP (last 3 results) No results for input(s): PROBNP in the last 8760 hours. HbA1C: No results for input(s): HGBA1C in the last 72 hours. CBG: Recent Labs  Lab 12/31/19 1637 12/31/19 1950 12/31/19 2341 01/01/20 0428 01/01/20 0741  GLUCAP 236*  334* 211* 76 85   Lipid Profile: Recent Labs    12/31/19 1849  TRIG 137   Thyroid Function Tests: No results for input(s): TSH, T4TOTAL, FREET4, T3FREE, THYROIDAB in the last 72 hours. Anemia Panel: No results for input(s): VITAMINB12, FOLATE, FERRITIN, TIBC, IRON, RETICCTPCT in the last 72 hours. Sepsis Labs: No results for input(s): PROCALCITON, LATICACIDVEN in the last 168 hours.  Recent Results (from the past 240 hour(s))  Respiratory Panel by RT PCR (Flu A&B, Covid) - Nasopharyngeal Swab     Status: Abnormal   Collection Time: 12/28/19  4:01 PM   Specimen: Nasopharyngeal Swab  Result Value Ref Range Status   SARS Coronavirus 2 by RT PCR POSITIVE (A) NEGATIVE Final    Comment: RESULT CALLED TO, READ BACK BY AND VERIFIED WITH: DR.COVERT(CATH) AT 1707 ON 12/28/2019 BY MOSLEY,J (NOTE) SARS-CoV-2 target nucleic acids are DETECTED. SARS-CoV-2 RNA is generally detectable in upper respiratory specimens  during the acute phase of infection. Positive results are indicative of the presence of the identified virus, but do not rule out bacterial infection or co-infection with other pathogens not detected by the test. Clinical correlation with patient history and other diagnostic information is necessary to determine patient infection status. The expected result is Negative. Fact Sheet for Patients:  PinkCheek.be Fact Sheet for Healthcare Providers: GravelBags.it This test is not yet approved or cleared by the [place] FDA and  has been authorized for detection and/or diagnosis of SARS-CoV-2 by FDA under an Emergency Use Authorization (EUA).  This EUA will remain in effect (meaning this test ca n be used) for the duration of  the COVID-19 declaration under Section 564(b)(1) of the Act, 21 U.S.C. section 360bbb-3(b)(1), unless the authorization is terminated or revoked sooner.    Influenza A by PCR NEGATIVE NEGATIVE Final    Influenza B by PCR NEGATIVE NEGATIVE Final    Comment: (NOTE) The Xpert Xpress SARS-CoV-2/FLU/RSV assay is intended as an aid in  the diagnosis of influenza from Nasopharyngeal swab specimens and  should not be used as a sole basis for treatment. Nasal washings and  aspirates are unacceptable for Xpert Xpress SARS-CoV-2/FLU/RSV  testing. Fact Sheet for Patients: PinkCheek.be Fact Sheet for Healthcare Providers: GravelBags.it This test is not yet approved or cleared by the [place] FDA and  has been authorized for detection and/or diagnosis of SARS-CoV-2 by  FDA under an Emergency Use Authorization (EUA). This EUA will remain  in effect (meaning this test can be used) for the duration of the  Covid-19 declaration  under Section 564(b)(1) of the Act, 21  U.S.C. section 360bbb-3(b)(1), unless the authorization is  terminated or revoked. Performed at [facility], [address], [place], [place] [address]   MRSA PCR Screening     Status: None   Collection Time: 12/28/19  6:16 PM   Specimen: Urine, Catheterized; Nasopharyngeal  Result Value Ref Range Status   MRSA by PCR NEGATIVE NEGATIVE Final    Comment:        The GeneXpert MRSA Assay (FDA approved for NASAL specimens only), is one component of a comprehensive MRSA colonization surveillance program. It is not intended to diagnose MRSA infection nor to guide or monitor treatment for MRSA infections. Performed at [facility], [address], [place], [place] [address]          Radiology Studies: No results found.      Scheduled Meds: . vitamin C  500 mg Oral Daily  . aspirin  81 mg Oral Daily  . brimonidine  1 drop Both Eyes BID  . brinzolamide  1 drop Both Eyes BID  . chlorhexidine gluconate (MEDLINE KIT)  15 mL Mouth Rinse BID  . Chlorhexidine Gluconate Cloth  6 each Topical Daily  . docusate  100 mg Oral BID  . doxycycline   100 mg Oral Q12H  . insulin aspart  0-20 Units Subcutaneous Q4H  . lamoTRIgine  100 mg Oral Daily  . midodrine  5 mg Oral TID  . polyethylene glycol  17 g Oral Daily  . ranolazine  500 mg Oral BID  . sodium chloride flush  3 mL Intravenous Q12H  . timolol  1 drop Both Eyes BID  . zinc sulfate  220 mg Oral Daily   Continuous Infusions: . sodium chloride 10 mL/hr at 01/01/20 0600  . sodium chloride 10 mL/hr at 12/29/19 0600  . famotidine (PEPCID) IV 20 mg (12/31/19 2305)  . heparin 750 Units/hr (01/01/20 0600)  . levETIRAcetam 1,500 mg (01/01/20 0017)  . norepinephrine (LEVOPHED) Adult infusion Stopped (12/31/19 2317)  . remdesivir 100 mg in NS 100 mL Stopped (12/31/19 1116)     LOS: 4 days    Time spent: 30 mins     Wyvonnia Dusky, MD [facility] Pager [phone]  If 7PM-7AM, please contact night-coverage [email or web address] 01/01/2020, 8:13 AM

## 2020-01-01 NOTE — Consult Note (Signed)
ANTICOAGULATION CONSULT NOTE  Pharmacy Consult for Heparin  Indication: ACS / STEMI  Patient Measurements:   Heparin Dosing Weight: 55.3 kg   Vital Signs: Temp: 97.5 F (36.4 C) (05/10 0016) Temp Source: Oral (05/10 0016)  Labs: Recent Labs     0000 12/30/19 0721 12/30/19 1720 12/30/19 2121 12/30/19 2233 12/31/19 0123 12/31/19 0903 12/31/19 1849 12/31/19 2308 01/01/20 0441  HGB   < > 9.2*  --   --   --  8.6*  --   --   --  7.7*  HCT  --  29.0*  --   --   --  27.6*  --   --   --  25.0*  PLT  --  236  --   --   --  205  --   --   --  185  HEPARINUNFRC  --  0.71*   < >  --   --  0.31   < > >3.60* 0.38 0.47  CREATININE  --  0.82  --   --   --  0.96  0.97  --   --   --  0.82  TROPONINIHS  --   --   --  682* 763*  --   --   --   --   --    < > = values in this interval not displayed.    Medications:  Per chart review, no prior anticoagulants at home.   Assessment: Pharmacy has been consulted for heparin dosing in patient who presented with chest pain that radiated to her left arm. The patient had respiratory failure on 5/6 during the cardiac cath procedure and was transferred to CCU. Currently H&H is low but stable and platelets are WNL  Heparin Course: 5/9 0123 HL 0.31  5/9 0903 HL 0.18   Goal of Therapy:  Heparin level 0.3-0.7 units/ml Monitor platelets by anticoagulation protocol: Yes   Plan:  05/11 @ 0500 HL 0.47 therapeutic. Will continue current rate and will recheck w/ am labs, hgb dropping will continue to monitor.  Thomasene Ripple, PharmD Clinical Pharmacist 01/01/2020 7:05 AM

## 2020-01-01 NOTE — Consult Note (Signed)
PHARMACY CONSULT NOTE  Pharmacy Consult for Electrolyte Monitoring and Replacement   Recent Labs: Potassium (mmol/L)  Date Value  01/01/2020 3.8  12/15/2014 4.2   Magnesium (mg/dL)  Date Value  15/87/2761 2.1   Calcium (mg/dL)  Date Value  84/85/9276 8.3 (L)   Calcium, Total (mg/dL)  Date Value  39/43/2003 8.6 (L)   Albumin (g/dL)  Date Value  79/44/4619 3.1 (L)  12/14/2014 3.9   Phosphorus (mg/dL)  Date Value  09/14/2409 3.2   Sodium (mmol/L)  Date Value  01/01/2020 138  12/15/2014 133 (L)    Assessment: 80 year-old female who presented with chest pain that radiated to her left arm, determined to be a STEMI. The patient had respiratory failure on 5/6 during the cardiac cath procedure and was transferred to CCU. She has been extubated.   Goal of Therapy:  Potassium 4.0 - 5.1 mmol/L Magnesium 2.0 - 2.4 mg/dL All Other Electrolytes WNL  Plan:  No electrolyte replacement indicated today. BMP has been ordered daily for now. Will continue to follow along.  Pricilla Riffle, PharmD Clinical Pharmacist 01/01/2020 1:45 PM

## 2020-01-01 NOTE — Progress Notes (Signed)
Vital signs reviewed, ICU needs resolved  Will sign off at this time. No further recommendations at this time.  Please call [phone] for further questions. Thank you.    Katheline Brendlinger David Callaway Hardigree, M.D.  [facility]  Medical Director ICU-[facility] Medical Director [facility]io[facility] Department   

## 2020-01-01 NOTE — Progress Notes (Signed)
  Speech Language Pathology Treatment: Dysphagia  Patient Details Name: Leah Small MRN: [id number] DOB: 1941-02-05 Today's Date: 01/01/2020 Time: 3532-9924 SLP Time Calculation (min) (ACUTE ONLY): 45 min  Assessment / Plan / Recommendation Clinical Impression  Pt seen today for ongoing assessment of swallowing, toleration of diet, and trials to upgrade liquid consistency in diet. Pt appears to be improving; she is sitting in chair today w/ clear, and stronger, vocal quality. Pt stated she is ready for "Hot coffee". NSG reported no difficulty w/ the Nectar liquids, dys. 3 diet over the weekend.  Discussed w/ pt the importance of sitting Fully upright, even in a chair, for oral intake, meals. Discussed this precaution, and other general aspiration precautions, to increase her safety during oral intake and reduce risk for aspiration. Pt agreed and verbally repeated back the general aspiration precautions discussed. Pt then consumed po trials of thin liquids via Cup and purees w/ No overt, clinical s/s of aspiration during/post po trials. O2 sats remained in upper 90s, no changes in HR/RR. Vocal quality clear b/t trials. Oral phase appeared [facility] w/ timely bolus management and control of bolus propulsion for A-P transfer for swallowing. Pt fed self w/ setup support.  Recommend a mech soft consistency diet w/ Minced meats, moistened foods; Thin liquidsvia Cup; recommend general aspiration precautions, Pills WHOLE in Puree for safer, easier swallowing. Support at meals sitting in a chair as much as possible. ST services will be available for any new issues while admitted. MD/NSG agreed.    HPI HPI: Pt is a 79 y.o. female with a history of Multiple medical issues including GERD, Memory deficits, arthritis, chronic stable angina, coronary disease, hyperlipidemia, hypertension, MI, seizure, TIAs who presents to the ED for central chest pain that radiated to the left arm beginning at 11:30 AM. Patient with 10  out of 10 chest pain on arrival. Pt was taken to the Cath Lab with subsequent deterioration and oral intubation; extubated on 12/30/2019. Pt has Baseline Memory Deficits per chart.       SLP Plan  Continue with current plan of care       Recommendations  Diet recommendations: Dysphagia 3 (mechanical soft);Thin liquid Liquids provided via: Cup(monitor straw use and do not use if coughing noted) Medication Administration: Whole meds with puree(for safer swallowing at this time) Supervision: Patient able to self feed(tray setup and positioning) Compensations: Minimize environmental distractions;Slow rate;Small sips/bites;Follow solids with liquid Postural Changes and/or Swallow Maneuvers: Seated upright 90 degrees;Upright 30-60 min after meal                General recommendations: (Dietician f/u) Oral Care Recommendations: Oral care BID;Oral care before and after PO;Patient independent with oral care Follow up Recommendations: None SLP Visit Diagnosis: Dysphagia, pharyngeal phase (R13.13) Plan: Continue with current plan of care       GO                Jerilynn Som, MS, CCC-SLP Sila S[facility]field 01/01/2020, 10:26 AM

## 2020-01-01 NOTE — Progress Notes (Signed)
Inpatient Diabetes Program Recommendations  AACE/ADA: New Consensus Statement on Inpatient Glycemic Control (2015)  Target Ranges:  Prepandial:   less than 140 mg/dL      Peak postprandial:   less than 180 mg/dL (1-2 hours)      Critically ill patients:  140 - 180 mg/dL   Results for ARLET, MARTER (MRN [id number]) as of 01/01/2020 12:25  Ref. Range 12/30/2019 23:52 12/31/2019 04:03 12/31/2019 07:21 12/31/2019 11:13 12/31/2019 16:37 12/31/2019 19:50  Glucose-Capillary Latest Ref Range: 70 - 99 mg/dL 413 (H)  7 units NOVOLOG  116 (H) 84 241 (H)  7 units NOVOLOG  236 (H)  7 units NOVOLOG  334 (H)  15 units NOVOLOG    Results for MADELEINE, FENN (MRN [id number]) as of 01/01/2020 12:25  Ref. Range 12/31/2019 23:41 01/01/2020 04:28 01/01/2020 07:41 01/01/2020 11:33  Glucose-Capillary Latest Ref Range: 70 - 99 mg/dL 536 (H)  7 units NOVOLOG  76 85 228 (H)  7 units NOVOLOG     Home DM Meds: Glipizide 5 mg Daily        Lantus 18 units QHS (NOT taking)        Metformin 1000 mg BID  Current Orders: Novolog Resistant Correction Scale/ SSI (0-20 units) Q4 hours   Endocrinologist: Dr. Tedd Sias with [facility]--Last seen 04/28/2019--At that visit, Lantus and Humalog stopped--Pt advised to decrease Glipizide to 5 mg Daily and Continue Metformin 1000 mg BID    Steroids stopped--last dose given 05/07.    MD- Note patient having glucose elevations in the afternoon.  Please consider the following:  1. Change timing of the Novolog SSi to TID AC + HS (currently ordered Q4 hours and pt allowed PO diet)  2. Start Novolog Meal Coverage: Novolog 4 units TID with meals  (Please add the following Hold Parameters: Hold if pt eats <50% of meal, Hold if pt NPO)    --Will follow patient during hospitalization--  Ambrose Finland RN, MSN, CDE Diabetes Coordinator Inpatient Glycemic Control Team Team Pager: [phone] (8a-5p)

## 2020-01-01 NOTE — Progress Notes (Signed)
[facility] Encounter Note  Patient: Leah Small / Admit Date: 12/28/2019 / Date of Encounter: 01/01/2020, 1:00 PM   Subjective: Patient continues to be critical with acute cardiorespiratory failure which has now improved and patient has been extubated andy now ambulating . Patient is Covid positive with likely inflammatory abnormalities by chest x-ray with possible mild amount of edema. Oxygen saturation currently good. Urine output significantly improved over the last few days. Troponin elevation consistent with myocardial infarction at 2712 and still evaluated to 763 after episode of chest pain on sturday which was her typical angina from years bfore. Patient also remains anemic without evidence of significant concerns of overt bleeding. Echocardiogram showing severe LV systolic dysfunction with ejection fraction of 15 to 20% after cardiac catheterization showing diffuse coronary artery disease with only a LIMA to the LAD patent which is unchanged from cardiac catheterization in 2017 with no amenable intervention. Blood pressure has been low normal with some concerns of addition of nitrate and/or Ranexa at this time  Review of Systems: Cannot assess   objective: Telemetry: Sinus tachycardia Physical Exam: Blood pressure (!) 123/45, pulse 88, temperature (!) 97.5 F (36.4 C), temperature source Oral, resp. rate 16, SpO2 (!) 69 %. There is no height or weight on file to calculate BMI.   As per medicine  Intake/Output Summary (Last 24 hours) at 01/01/2020 1300 Last data filed at 01/01/2020 0600 Gross per 24 hour  Intake 1148.26 ml  Output --  Net 1148.26 ml    Inpatient Medications:  . vitamin C  500 mg Oral Daily  . aspirin  81 mg Oral Daily  . brimonidine  1 drop Both Eyes BID  . brinzolamide  1 drop Both Eyes BID  . chlorhexidine gluconate (MEDLINE KIT)  15 mL Mouth Rinse BID  . Chlorhexidine Gluconate Cloth  6 each Topical Daily  . docusate  100 mg Oral  BID  . doxycycline  100 mg Oral Q12H  . insulin aspart  0-20 Units Subcutaneous Q4H  . lamoTRIgine  100 mg Oral Daily  . midodrine  5 mg Oral TID  . polyethylene glycol  17 g Oral Daily  . sodium chloride flush  3 mL Intravenous Q12H  . timolol  1 drop Both Eyes BID  . zinc sulfate  220 mg Oral Daily   Infusions:  . sodium chloride 10 mL/hr at 01/01/20 0600  . sodium chloride 10 mL/hr at 12/29/19 0600  . famotidine (PEPCID) IV 20 mg (01/01/20 1011)  . heparin 750 Units/hr (01/01/20 0600)  . levETIRAcetam Stopped (01/01/20 0800)  . norepinephrine (LEVOPHED) Adult infusion Stopped (12/31/19 2317)    Labs: Recent Labs    12/30/19 0721 12/30/19 0721 12/31/19 0123 01/01/20 0441  NA 135   < > 137  137 138  K 3.7   < > 3.7  3.6 3.8  CL 103   < > 108  108 111  CO2 19*   < > 21*  22 19*  GLUCOSE 144*   < > 207*  183* 89  BUN 26*   < > 40*  40* 33*  CREATININE 0.82   < > 0.96  0.97 0.82  CALCIUM 8.5*   < > 8.8*  8.8* 8.3*  MG 2.0  --  2.1  --   PHOS 5.0*  --   --  3.2   < > = values in this interval not displayed.   Recent Labs    12/31/19 0123 01/01/20 0441  AST  30 22  ALT 29 19  ALKPHOS 61 62  BILITOT 0.5 0.7  PROT 7.3 6.2*  ALBUMIN 3.7 3.1*   Recent Labs    12/31/19 0123 01/01/20 0441  WBC 9.1 7.6  NEUTROABS 7.0 5.1  HGB 8.6* 7.7*  HCT 27.6* 25.0*  MCV 96.8 98.0  PLT 205 185   No results for input(s): CKTOTAL, CKMB, TROPONINI in the last 72 hours. Invalid input(s): POCBNP No results for input(s): HGBA1C in the last 72 hours.   Weights: There were no vitals filed for this visit.   Radiology/Studies:  DG Abd 1 View  Result Date: 12/28/2019 CLINICAL DATA:  Tube placement EXAM: ABDOMEN - 1 VIEW COMPARISON:  None. FINDINGS: The enteric tube projects over the gastric body. There is a large amount of stool in the partially visualized colon. The visualized bowel gas pattern is unremarkable. IMPRESSION: Enteric tube projects over the gastric body. Large  stool burden in the partially visualized colon. Electronically Signed   By: Constance Holster M.D.   On: 12/28/2019 18:50   CARDIAC CATHETERIZATION  Result Date: 12/28/2019  Prox LAD lesion is 100% stenosed.  Dist LAD lesion is 30% stenosed.  Non-stenotic Ost Ramus to Ramus lesion was previously treated.  Ramus lesion is 30% stenosed.  Mid Cx lesion is 100% stenosed.  Prox Cx lesion is 90% stenosed.  Prox RCA to Mid RCA lesion is 90% stenosed.  Mid RCA lesion is 100% stenosed.  LIMA. No significant disease with insertion into the mid LAD  Prox Graft lesion is 100% stenosed.  Prox Graft lesion is 100% stenosed.  Prox Graft lesion is 100% stenosed.  Conclusion STEMI presentation with cardiac cath Left heart cath with grafts intervention was deferred Multivessel native coronary disease moderate disease in left main totally occluded ostial LAD proximal circumflex ostial right Totally occluded SVG to RCA circumflex and diagonal LIMA to mid LAD was normal Left ventriculogram showed moderately depressed left ventricular function around 35% globally Unsuccessful minx FemoStop was placed Patient was intubated sedated Case was then transferred to ICU critical care team for management Conservative cardiac input at this point   DG Chest Port 1 View  Result Date: 12/29/2019 CLINICAL DATA:  Acute respiratory failure EXAM: PORTABLE CHEST 1 VIEW COMPARISON:  Radiograph 12/28/2019 FINDINGS: Low positioning of the endotracheal tube which approximates the orifice of the right mainstem bronchus, recommend retraction at least 3 cm to the mid trachea. Transesophageal tube tip below the GE junction, beyond the level of imaging. Telemetry leads overlie the chest. Mild hyperexpansion of the lungs with some central vascular congestion. Increasing prominence of the interstitium with some more hazy opacity in the right lung base. No visible pneumothorax or effusion. Cardiomediastinal contours are stable with evidence of  prior sternotomy and CABG. No acute osseous or soft tissue abnormality. Degenerative changes are present in the imaged spine and shoulders. IMPRESSION: 1. Low positioning of the endotracheal tube which approximates the orifice of the right mainstem bronchus. Recommend retraction at least 3 cm. 2. Satisfactory positioning of the transesophageal tube. 3. Increasing prominence of the interstitium with some more hazy opacity in the right lung base, could reflect developing edema or infection. These results will be called to the ordering clinician or representative by the Radiologist Assistant, and communication documented in the PACS or [employer]. Electronically Signed   By: Lovena Le M.D.   On: 12/29/2019 05:50   DG Chest Port 1 View  Result Date: 12/28/2019 CLINICAL DATA:  Dyspnea. EXAM: PORTABLE CHEST 1  VIEW COMPARISON:  12/17/2017 FINDINGS: The endotracheal tube terminates above the carina by approximately 2.6 cm. The enteric tube extends below the left hemidiaphragm. The heart size is mildly enlarged. The patient is status post prior median sternotomy. There is prominence of the pulmonary vasculature. There are atherosclerotic changes of the thoracic aorta. The lungs appear slightly hyperexpanded. There are prominent interstitial lung markings. There is no definite acute osseous abnormality. IMPRESSION: 1. Lines and tubes as above. 2. Findings suggestive of developing interstitial edema. 3.  Aortic Atherosclerosis (ICD10-I70.0). Electronically Signed   By: Constance Holster M.D.   On: 12/28/2019 18:50   ECHOCARDIOGRAM COMPLETE  Result Date: 12/29/2019    ECHOCARDIOGRAM REPORT   Patient Name:   Leah Small Date of Exam: 12/29/2019 Medical Rec #:  [id number]       Height:       68.0 in Accession #:    [id number]      Weight:       122.0 lb Date of Birth:  03/17/1941       BSA:          1.657 m Patient Age:    78 years        BP:           111/61 mmHg Patient Gender: F               HR:           74  bpm. Exam Location:  [facility] Procedure: 2D Echo, Color Doppler and Cardiac Doppler Indications:     I21.3 STEMI  History:         Patient has prior history of Echocardiogram examinations. CAD,                  TIA; Risk Factors:Diabetes and HCL.  Sonographer:     Charmayne Sheer RDCS (AE) Referring Phys:  [id number] [facility] D CALLWOOD Diagnosing Phys: Serafina Royals MD  Sonographer Comments: Suboptimal apical window, suboptimal subcostal window and echo performed with patient supine and on artificial respirator. IMPRESSIONS  1. Left ventricular ejection fraction, by estimation, is <20%. The left ventricle has severely decreased function. The left ventricle demonstrates global hypokinesis. The left ventricular internal cavity size was mildly to moderately dilated. Left ventricular diastolic parameters are consistent with Grade I diastolic dysfunction (impaired relaxation).  2. Right ventricular systolic function is normal. The right ventricular size is normal. There is normal pulmonary artery systolic pressure.  3. Left atrial size was mildly dilated.  4. Right atrial size was mildly dilated.  5. The mitral valve is normal in structure. Mild to moderate mitral valve regurgitation.  6. The aortic valve is normal in structure. Aortic valve regurgitation is trivial. FINDINGS  Left Ventricle: Left ventricular ejection fraction, by estimation, is <20%. The left ventricle has severely decreased function. The left ventricle demonstrates global hypokinesis. The left ventricular internal cavity size was mildly to moderately dilated. There is borderline left ventricular hypertrophy. Left ventricular diastolic parameters are consistent with Grade I diastolic dysfunction (impaired relaxation). Right Ventricle: The right ventricular size is normal. No increase in right ventricular wall thickness. Right ventricular systolic function is normal. There is normal pulmonary artery systolic pressure. The tricuspid regurgitant velocity is 2.19 m/s,  and  with an assumed right atrial pressure of 10 mmHg, the estimated right ventricular systolic pressure is 18.2 mmHg. Left Atrium: Left atrial size was mildly dilated. Right Atrium: Right atrial size was mildly dilated. Pericardium: There is no evidence of pericardial effusion.  Mitral Valve: The mitral valve is normal in structure. Mild to moderate mitral valve regurgitation. MV peak gradient, 2.4 mmHg. The mean mitral valve gradient is 1.0 mmHg. Tricuspid Valve: The tricuspid valve is normal in structure. Tricuspid valve regurgitation is mild. Aortic Valve: The aortic valve is normal in structure. Aortic valve regurgitation is trivial. Aortic valve mean gradient measures 1.0 mmHg. Aortic valve peak gradient measures 2.5 mmHg. Aortic valve area, by VTI measures 1.92 cm. Pulmonic Valve: The pulmonic valve was normal in structure. Pulmonic valve regurgitation is not visualized. Aorta: The aortic root and ascending aorta are structurally normal, with no evidence of dilitation. IAS/Shunts: No atrial level shunt detected by color flow Doppler.  LEFT VENTRICLE PLAX 2D LVIDd:         4.98 cm  Diastology LVIDs:         4.43 cm  LV e' lateral:   5.11 cm/s LV PW:         0.83 cm  LV E/e' lateral: 11.4 LV IVS:        0.79 cm  LV e' medial:    3.05 cm/s LVOT diam:     1.60 cm  LV E/e' medial:  19.1 LV SV:         20 LV SV Index:   12 LVOT Area:     2.01 cm  LEFT ATRIUM         Index LA diam:    3.30 cm 1.99 cm/m  AORTIC VALVE                   PULMONIC VALVE AV Area (Vmax):    1.67 cm    PV Vmax:       0.69 m/s AV Area (Vmean):   1.87 cm    PV Vmean:      47.600 cm/s AV Area (VTI):     1.92 cm    PV VTI:        0.101 m AV Vmax:           79.20 cm/s  PV Peak grad:  1.9 mmHg AV Vmean:          47.000 cm/s PV Mean grad:  1.0 mmHg AV VTI:            0.106 m AV Peak Grad:      2.5 mmHg AV Mean Grad:      1.0 mmHg LVOT Vmax:         65.80 cm/s LVOT Vmean:        43.700 cm/s LVOT VTI:          0.101 m LVOT/AV VTI ratio: 0.95   AORTA Ao Root diam: 2.40 cm MITRAL VALVE               TRICUSPID VALVE MV Area (PHT): 3.91 cm    TR Peak grad:   19.2 mmHg MV Peak grad:  2.4 mmHg    TR Vmax:        219.00 cm/s MV Mean grad:  1.0 mmHg MV Vmax:       0.78 m/s    SHUNTS MV Vmean:      48.7 cm/s   Systemic VTI:  0.10 m MV Decel Time: 194 msec    Systemic Diam: 1.60 cm MV E velocity: 58.20 cm/s MV A velocity: 34.90 cm/s MV E/A ratio:  1.67 Serafina Royals MD Electronically signed by Serafina Royals MD Signature Date/Time: 12/29/2019/3:56:35 PM    Final      Assessment  and Recommendation  79 y.o. female with known three-vessel coronary artery disease hypertension hyperlipidemia with chronic anginal symptoms having acute cardio respiratory failure multifactorial in nature including acute myocardial infarction with Covid infection possibly causing worsening inflammatory lung disease with no coronary artery disease amenable to intervention at this time and severe LV systolic dysfunction due to above slightly more stable over the last 24 hours 1.  Continue supportive care for respiratory failure and infection with current evidence of good oxygenation 2.  No further cardiac intervention and/or diagnostics necessary at this time due to no evidence of amenable PCI and/or stent placement 3.  Continue heparin for further risk reduction of deep venous thrombosis as well as myocardial infarction treatment but will discontinue Plavix due to concerns of anemia and possible bleeding complications exacerbating above.  And can use single atiplatelt for now 4. Lasix as needed 5.  Reinstatement of Ranexa isosorbide when able although currently appears the blood pressure may be of concern.  Could potentially use topical nitrate as well 6. Diamond for treansfer to floor Signed, Serafina Royals M.D. FACC

## 2020-01-01 NOTE — Consult Note (Signed)
ANTICOAGULATION CONSULT NOTE  Pharmacy Consult for Heparin  Indication: ACS / STEMI  Patient Measurements:   Heparin Dosing Weight: 55.3 kg   Vital Signs: Temp: 97.5 F (36.4 C) (05/10 0016) Temp Source: Oral (05/10 0016) BP: 123/45 (05/09 1815) Pulse Rate: 88 (05/09 1700)  Labs: Recent Labs    12/29/19 0106 12/29/19 1009 12/30/19 0721 12/30/19 1720 12/30/19 2121 12/30/19 2233 12/31/19 0123 12/31/19 0123 12/31/19 0903 12/31/19 1849 12/31/19 2308  HGB 8.0*  --  9.2*  --   --   --  8.6*  --   --   --   --   HCT 25.5*  --  29.0*  --   --   --  27.6*  --   --   --   --   PLT 224  --  236  --   --   --  205  --   --   --   --   HEPARINUNFRC 0.29*   < > 0.71*   < >  --   --  0.31   < > 0.18* >3.60* 0.38  CREATININE 0.94  --  0.82  --   --   --  0.96  0.97  --   --   --   --   TROPONINIHS  --   --   --   --  682* 763*  --   --   --   --   --    < > = values in this interval not displayed.    Medications:  Per chart review, no prior anticoagulants at home.   Assessment: Pharmacy has been consulted for heparin dosing in patient who presented with chest pain that radiated to her left arm. The patient had respiratory failure on 5/6 during the cardiac cath procedure and was transferred to CCU. Currently H&H is low but stable and platelets are WNL  Heparin Course: 5/9 0123 HL 0.31  5/9 0903 HL 0.18   Goal of Therapy:  Heparin level 0.3-0.7 units/ml Monitor platelets by anticoagulation protocol: Yes   Plan:  05/10 @ 2300 HL (repeat) 0.38 therapeutic. Will continue current rate and will recheck HL w/ am labs, CBC trending stable will continue to monitor. There was a HL that was >3.60 and repeat was drawn confirming it was lab error.  Thomasene Ripple, PharmD Clinical Pharmacist 01/01/2020 12:52 AM

## 2020-01-02 ENCOUNTER — Encounter: Payer: Self-pay | Admitting: Internal Medicine

## 2020-01-02 LAB — CBC
HCT: 23.9 % — ABNORMAL LOW (ref 36.0–46.0)
Hemoglobin: 7.3 g/dL — ABNORMAL LOW (ref 12.0–15.0)
MCH: 30 pg (ref 26.0–34.0)
MCHC: 30.5 g/dL (ref 30.0–36.0)
MCV: 98.4 fL (ref 80.0–100.0)
Platelets: 180 10*3/uL (ref 150–400)
RBC: 2.43 MIL/uL — ABNORMAL LOW (ref 3.87–5.11)
RDW: 17.6 % — ABNORMAL HIGH (ref 11.5–15.5)
WBC: 6.4 10*3/uL (ref 4.0–10.5)
nRBC: 0 % (ref 0.0–0.2)

## 2020-01-02 LAB — BASIC METABOLIC PANEL
Anion gap: 6 (ref 5–15)
BUN: 30 mg/dL — ABNORMAL HIGH (ref 8–23)
CO2: 18 mmol/L — ABNORMAL LOW (ref 22–32)
Calcium: 8.2 mg/dL — ABNORMAL LOW (ref 8.9–10.3)
Chloride: 113 mmol/L — ABNORMAL HIGH (ref 98–111)
Creatinine, Ser: 0.82 mg/dL (ref 0.44–1.00)
GFR calc Af Amer: >60 mL/min (ref >60)
GFR calc non Af Amer: >60 mL/min (ref >60)
Glucose, Bld: 78 mg/dL (ref 70–99)
Potassium: 4.1 mmol/L (ref 3.5–5.1)
Sodium: 137 mmol/L (ref 135–145)

## 2020-01-02 LAB — GLUCOSE, CAPILLARY
Glucose-Capillary: 122 mg/dL — ABNORMAL HIGH (ref 70–99)
Glucose-Capillary: 181 mg/dL — ABNORMAL HIGH (ref 70–99)
Glucose-Capillary: 208 mg/dL — ABNORMAL HIGH (ref 70–99)
Glucose-Capillary: 223 mg/dL — ABNORMAL HIGH (ref 70–99)
Glucose-Capillary: 243 mg/dL — ABNORMAL HIGH (ref 70–99)
Glucose-Capillary: 84 mg/dL (ref 70–99)
Glucose-Capillary: 85 mg/dL (ref 70–99)

## 2020-01-02 LAB — HEPARIN LEVEL (UNFRACTIONATED): Heparin Unfractionated: 0.21 IU/mL — ABNORMAL LOW (ref 0.30–0.70)

## 2020-01-02 MED ORDER — RANOLAZINE ER 500 MG PO TB12
500.0000 mg | ORAL_TABLET | Freq: Two times a day (BID) | ORAL | Status: DC
  Administered 2020-01-02 – 2020-01-03 (×3): 500 mg via ORAL
  Filled 2020-01-02 (×4): qty 1

## 2020-01-02 MED ORDER — MIDODRINE HCL 5 MG PO TABS
2.5000 mg | ORAL_TABLET | Freq: Once | ORAL | Status: AC
  Administered 2020-01-02: 2.5 mg via ORAL
  Filled 2020-01-02: qty 1

## 2020-01-02 MED ORDER — SODIUM CHLORIDE 0.9 % IV BOLUS
250.0000 mL | Freq: Once | INTRAVENOUS | Status: AC
  Administered 2020-01-02: 250 mL via INTRAVENOUS

## 2020-01-02 NOTE — Progress Notes (Signed)
Report called to RN [id number]. Patient with no complaints at the current time. Will transfer with nurse via WC.

## 2020-01-02 NOTE — Progress Notes (Signed)
[facility] Encounter Note  Patient: Leah Small / Admit Date: 12/28/2019 / Date of Encounter: 01/02/2020, 1:45 PM   Subjective: Patient continues to be in critical care with acute cardiorespiratory failure which has now improved and patient has been extubated andy now ambulating in room with meals . Patient is Covid positive with likely inflammatory abnormalities by chest x-ray with possible mild amount of edema. Oxygen saturation currently good. Urine output significantly improved over the last few days. Troponin elevation consistent with myocardial infarction at 2712 and still evaluated to 763 after episode of chest pain on saturday which was her typical angina from years bfore but now less frequent . Patient also remains anemic without evidence of significant concerns of overt bleeding for which she may need transfusion. Echocardiogram showing severe LV systolic dysfunction with ejection fraction of 15 to 20% after cardiac catheterization showing diffuse coronary artery disease with only a LIMA to the LAD patent which is unchanged from cardiac catheterization in 2017 with no amenable intervention. Blood pressure has been low normal with some concerns of addition of nitrate and/or Ranexa at this time   Review of Systems: Cannot assess   objective: Telemetry: Sinus tachycardia Physical Exam: Blood pressure 112/62, pulse 69, temperature (!) 97.1 F (36.2 C), temperature source Oral, resp. rate 12, SpO2 93 %. There is no height or weight on file to calculate BMI.   As per medicine  Intake/Output Summary (Last 24 hours) at 01/02/2020 1345 Last data filed at 01/02/2020 0831 Gross per 24 hour  Intake 1351.37 ml  Output --  Net 1351.37 ml    Inpatient Medications:  . vitamin C  500 mg Oral Daily  . aspirin  81 mg Oral Daily  . brimonidine  1 drop Both Eyes BID  . brinzolamide  1 drop Both Eyes BID  . chlorhexidine gluconate (MEDLINE KIT)  15 mL Mouth Rinse BID  .  Chlorhexidine Gluconate Cloth  6 each Topical Daily  . docusate  100 mg Oral BID  . insulin aspart  0-20 Units Subcutaneous Q4H  . lamoTRIgine  100 mg Oral Daily  . polyethylene glycol  17 g Oral Daily  . ranolazine  500 mg Oral BID  . sodium chloride flush  3 mL Intravenous Q12H  . timolol  1 drop Both Eyes BID  . zinc sulfate  220 mg Oral Daily   Infusions:  . sodium chloride 10 mL/hr at 01/02/20 0831  . sodium chloride 10 mL/hr at 12/29/19 0600  . famotidine (PEPCID) IV 20 mg (01/02/20 0943)  . heparin 750 Units/hr (01/02/20 1038)  . levETIRAcetam 1,500 mg (01/02/20 0024)  . norepinephrine (LEVOPHED) Adult infusion Stopped (01/01/20 1458)    Labs: Recent Labs    12/31/19 0123 12/31/19 0123 01/01/20 0441 01/02/20 0604  NA 137  137   < > 138 137  K 3.7  3.6   < > 3.8 4.1  CL 108  108   < > 111 113*  CO2 21*  22   < > 19* 18*  GLUCOSE 207*  183*   < > 89 78  BUN 40*  40*   < > 33* 30*  CREATININE 0.96  0.97   < > 0.82 0.82  CALCIUM 8.8*  8.8*   < > 8.3* 8.2*  MG 2.1  --   --   --   PHOS  --   --  3.2  --    < > = values in this interval not displayed.  Recent Labs    12/31/19 0123 01/01/20 0441  AST 30 22  ALT 29 19  ALKPHOS 61 62  BILITOT 0.5 0.7  PROT 7.3 6.2*  ALBUMIN 3.7 3.1*   Recent Labs    12/31/19 0123 12/31/19 0123 01/01/20 0441 01/02/20 0604  WBC 9.1   < > 7.6 6.4  NEUTROABS 7.0  --  5.1  --   HGB 8.6*   < > 7.7* 7.3*  HCT 27.6*   < > 25.0* 23.9*  MCV 96.8   < > 98.0 98.4  PLT 205   < > 185 180   < > = values in this interval not displayed.   No results for input(s): CKTOTAL, CKMB, TROPONINI in the last 72 hours. Invalid input(s): POCBNP No results for input(s): HGBA1C in the last 72 hours.   Weights: There were no vitals filed for this visit.   Radiology/Studies:  DG Abd 1 View  Result Date: 12/28/2019 CLINICAL DATA:  Tube placement EXAM: ABDOMEN - 1 VIEW COMPARISON:  None. FINDINGS: The enteric tube projects over the  gastric body. There is a large amount of stool in the partially visualized colon. The visualized bowel gas pattern is unremarkable. IMPRESSION: Enteric tube projects over the gastric body. Large stool burden in the partially visualized colon. Electronically Signed   By: Constance Holster M.D.   On: 12/28/2019 18:50   CARDIAC CATHETERIZATION  Result Date: 12/28/2019  Prox LAD lesion is 100% stenosed.  Dist LAD lesion is 30% stenosed.  Non-stenotic Ost Ramus to Ramus lesion was previously treated.  Ramus lesion is 30% stenosed.  Mid Cx lesion is 100% stenosed.  Prox Cx lesion is 90% stenosed.  Prox RCA to Mid RCA lesion is 90% stenosed.  Mid RCA lesion is 100% stenosed.  LIMA. No significant disease with insertion into the mid LAD  Prox Graft lesion is 100% stenosed.  Prox Graft lesion is 100% stenosed.  Prox Graft lesion is 100% stenosed.  Conclusion STEMI presentation with cardiac cath Left heart cath with grafts intervention was deferred Multivessel native coronary disease moderate disease in left main totally occluded ostial LAD proximal circumflex ostial right Totally occluded SVG to RCA circumflex and diagonal LIMA to mid LAD was normal Left ventriculogram showed moderately depressed left ventricular function around 35% globally Unsuccessful minx FemoStop was placed Patient was intubated sedated Case was then transferred to ICU critical care team for management Conservative cardiac input at this point   DG Chest Port 1 View  Result Date: 12/29/2019 CLINICAL DATA:  Acute respiratory failure EXAM: PORTABLE CHEST 1 VIEW COMPARISON:  Radiograph 12/28/2019 FINDINGS: Low positioning of the endotracheal tube which approximates the orifice of the right mainstem bronchus, recommend retraction at least 3 cm to the mid trachea. Transesophageal tube tip below the GE junction, beyond the level of imaging. Telemetry leads overlie the chest. Mild hyperexpansion of the lungs with some central vascular  congestion. Increasing prominence of the interstitium with some more hazy opacity in the right lung base. No visible pneumothorax or effusion. 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 No acute osseous or soft tissue abnormality. Degenerative changes are present in the imaged spine and shoulders. IMPRESSION: 1. Low positioning of the endotracheal tube which approximates the orifice of the right mainstem bronchus. Recommend retraction at least 3 cm. 2. Satisfactory positioning of the transesophageal tube. 3. Increasing prominence of the interstitium with some more hazy opacity in the right lung base, could reflect developing edema or infection.  These results will be called to the ordering clinician or representative by the Radiologist Assistant, and communication documented in the PACS or [employer]. Electronically Signed   By: Lovena Le M.D.   On: 12/29/2019 05:50   DG Chest Port 1 View  Result Date: 12/28/2019 CLINICAL DATA:  Dyspnea. EXAM: PORTABLE CHEST 1 VIEW COMPARISON:  12/17/2017 FINDINGS: The endotracheal tube terminates above the carina by approximately 2.6 cm. The enteric tube extends below the left hemidiaphragm. The heart size is mildly enlarged. The patient is status post prior median sternotomy. There is prominence of the pulmonary vasculature. There are atherosclerotic changes of the thoracic aorta. The lungs appear slightly hyperexpanded. There are prominent interstitial lung markings. There is no definite acute osseous abnormality. IMPRESSION: 1. Lines and tubes as above. 2. Findings suggestive of developing interstitial edema. 3.  Aortic Atherosclerosis (ICD10-I70.0). Electronically Signed   By: Constance Holster M.D.   On: 12/28/2019 18:50   ECHOCARDIOGRAM COMPLETE  Result Date: 12/29/2019    ECHOCARDIOGRAM REPORT   Patient Name:   TYSHEA IMEL Date of Exam: 12/29/2019 Medical Rec #:  [id number]       Height:       68.0 in Accession #:     [id number]      Weight:       122.0 lb Date of Birth:  February 26, 1941       BSA:          1.657 m Patient Age:    79 years        BP:           111/61 mmHg Patient Gender: F               HR:           74 bpm. Exam Location:  [facility] Procedure: 2D Echo, Color Doppler and Cardiac Doppler Indications:     I21.3 STEMI  History:         Patient has prior history of Echocardiogram examinations. CAD,                  TIA; Risk Factors:Diabetes and HCL.  Sonographer:     Charmayne Sheer RDCS (AE) Referring Phys:  [id number] [facility] CALLWOOD Diagnosing Phys: Serafina Royals MD  Sonographer Comments: Suboptimal apical window, suboptimal subcostal window and echo performed with patient supine and on artificial respirator. IMPRESSIONS  1. Left ventricular ejection fraction, by estimation, is <20%. The left ventricle has severely decreased function. The left ventricle demonstrates global hypokinesis. The left ventricular internal cavity size was mildly to moderately dilated. Left ventricular diastolic parameters are consistent with Grade I diastolic dysfunction (impaired relaxation).  2. Right ventricular systolic function is normal. The right ventricular size is normal. There is normal pulmonary artery systolic pressure.  3. Left atrial size was mildly dilated.  4. Right atrial size was mildly dilated.  5. The mitral valve is normal in structure. Mild to moderate mitral valve regurgitation.  6. The aortic valve is normal in structure. Aortic valve regurgitation is trivial. FINDINGS  Left Ventricle: Left ventricular ejection fraction, by estimation, is <20%. The left ventricle has severely decreased function. The left ventricle demonstrates global hypokinesis. The left ventricular internal cavity size was mildly to moderately dilated. There is borderline left ventricular hypertrophy. Left ventricular diastolic parameters are consistent with Grade I diastolic dysfunction (impaired relaxation). Right Ventricle: The right ventricular size is  normal. No increase in right ventricular wall thickness. Right ventricular systolic function is normal. There  is normal pulmonary artery systolic pressure. The tricuspid regurgitant velocity is 2.19 m/s, and  with an assumed right atrial pressure of 10 mmHg, the estimated right ventricular systolic pressure is 96.7 mmHg. Left Atrium: Left atrial size was mildly dilated. Right Atrium: Right atrial size was mildly dilated. Pericardium: There is no evidence of pericardial effusion. Mitral Valve: The mitral valve is normal in structure. Mild to moderate mitral valve regurgitation. MV peak gradient, 2.4 mmHg. The mean mitral valve gradient is 1.0 mmHg. Tricuspid Valve: The tricuspid valve is normal in structure. Tricuspid valve regurgitation is mild. Aortic Valve: The aortic valve is normal in structure. Aortic valve regurgitation is trivial. Aortic valve mean gradient measures 1.0 mmHg. Aortic valve peak gradient measures 2.5 mmHg. Aortic valve area, by VTI measures 1.92 cm. Pulmonic Valve: The pulmonic valve was normal in structure. Pulmonic valve regurgitation is not visualized. Aorta: The aortic root and ascending aorta are structurally normal, with no evidence of dilitation. IAS/Shunts: No atrial level shunt detected by color flow Doppler.  LEFT VENTRICLE PLAX 2D LVIDd:         4.98 cm  Diastology LVIDs:         4.43 cm  LV e' lateral:   5.11 cm/s LV PW:         0.83 cm  LV E/e' lateral: 11.4 LV IVS:        0.79 cm  LV e' medial:    3.05 cm/s LVOT diam:     1.60 cm  LV E/e' medial:  19.1 LV SV:         20 LV SV Index:   12 LVOT Area:     2.01 cm  LEFT ATRIUM         Index LA diam:    3.30 cm 1.99 cm/m  AORTIC VALVE                   PULMONIC VALVE AV Area (Vmax):    1.67 cm    PV Vmax:       0.69 m/s AV Area (Vmean):   1.87 cm    PV Vmean:      47.600 cm/s AV Area (VTI):     1.92 cm    PV VTI:        0.101 m AV Vmax:           79.20 cm/s  PV Peak grad:  1.9 mmHg AV Vmean:          47.000 cm/s PV Mean grad:   1.0 mmHg AV VTI:            0.106 m AV Peak Grad:      2.5 mmHg AV Mean Grad:      1.0 mmHg LVOT Vmax:         65.80 cm/s LVOT Vmean:        43.700 cm/s LVOT VTI:          0.101 m LVOT/AV VTI ratio: 0.95  AORTA Ao Root diam: 2.40 cm MITRAL VALVE               TRICUSPID VALVE MV Area (PHT): 3.91 cm    TR Peak grad:   19.2 mmHg MV Peak grad:  2.4 mmHg    TR Vmax:        219.00 cm/s MV Mean grad:  1.0 mmHg MV Vmax:       0.78 m/s    SHUNTS MV Vmean:      48.7 cm/s  Systemic VTI:  0.10 m MV Decel Time: 194 msec    Systemic Diam: 1.60 cm MV E velocity: 58.20 cm/s MV A velocity: 34.90 cm/s MV E/A ratio:  1.67 Serafina Royals MD Electronically signed by Serafina Royals MD Signature Date/Time: 12/29/2019/3:56:35 PM    Final      Assessment and Recommendation  79 y.o. female with known three-vessel coronary artery disease hypertension hyperlipidemia with chronic anginal symptoms having acute cardio respiratory failure multifactorial in nature including acute myocardial infarction with Covid infection possibly causing worsening inflammatory lung disease with no coronary artery disease amenable to intervention at this time and severe LV systolic dysfunction due to above slightly more stable over the last 24 hours 1.  Continue supportive care for respiratory failure and infection with current evidence of good oxygenation and ok to transfer to floor if needed 2.  No further cardiac intervention and/or diagnostics necessary at this time due to no evidence of amenable PCI and/or stent placement 3.  disContinue heparin for further risk reduction of  myocardial infarction treatment and l discontinue Plavix due to concerns of anemia and possible bleeding complications exacerbating above.  And can use single atiplatelt for now if dvt prophylaxis needed than can give lovenox but still concerns of anemia 4. Lasix as needed 5.  Reinstatement of Ranexa isosorbide when able although currently appears the blood pressure may be of  concern.  Could potentially use topical nitrate as well if  Needed  6. [place] for treansfer to floor Signed, Serafina Royals M.D. FACC

## 2020-01-02 NOTE — Progress Notes (Signed)
ntg 0.4mg  given x 3 now.  CP is 6/10.

## 2020-01-02 NOTE — Progress Notes (Signed)
Chest pain now 7/10.  Bp came down to 120's.  Pt states she has chest pain every day and takes ntg daily.  She went on to say that her chest pain never gets any better than a five.

## 2020-01-02 NOTE — Consult Note (Signed)
PHARMACY CONSULT NOTE  Pharmacy Consult for Electrolyte Monitoring and Replacement   Recent Labs: Potassium (mmol/L)  Date Value  01/02/2020 4.1  12/15/2014 4.2   Magnesium (mg/dL)  Date Value  84/07/8207 2.1   Calcium (mg/dL)  Date Value  13/88/7195 8.2 (L)   Calcium, Total (mg/dL)  Date Value  97/47/1855 8.6 (L)   Albumin (g/dL)  Date Value  01/58/6825 3.1 (L)  12/14/2014 3.9   Phosphorus (mg/dL)  Date Value  74/93/5521 3.2   Sodium (mmol/L)  Date Value  01/02/2020 137  12/15/2014 133 (L)    Assessment: 79 year-old female who presented with chest pain that radiated to her left arm, determined to be a STEMI. The patient had respiratory failure on 5/6 during the cardiac cath procedure and was transferred to CCU. She has been extubated.   Goal of Therapy:  Potassium 4.0 - 5.1 mmol/L Magnesium 2.0 - 2.4 mg/dL All Other Electrolytes WNL  Plan:  No electrolyte replacement indicated today. BMP has been ordered daily for now. Will continue to follow along.  Pricilla Riffle, PharmD Clinical Pharmacist 01/02/2020 1:54 PM

## 2020-01-02 NOTE — Progress Notes (Signed)
PROGRESS NOTE    Leah Small  MWU:[id number] DOB: [birth year] DOA: 12/28/2019 PCP: Idelle Crouch, MD    HPI was taken from Dr.Kasa:  79 year old female known coronary disease history of coronary bypass surgery history of non-STEMI last cath was 2017 by Dr.Aridapatient has chronic angina on Ranexa and nitrates diabetes hypertension hyperlipidemia seizure disorder.  Patient had acute onset of substernal chest discomfort 10 out of 10 she finally called rescue and was brought to the emergency room she taken nitroglycerin sublingual at home without significant improvement.   Patient also complained of shortness of breath not able to lie flat. When the patient was brought in by rescue she had ST elevation on EKG no absolute no absolute change from previous EKG reviewed from 2019.   Patient has chronic diffuse changes of ST elevation and ST depression throughout but was different the patient had substernal chest crushing chest pain 10 out of 10 with shortness of breath and dyspnea.   COVID +  had not been vaccinated   During the course of cardiac cath procedure when access has been obtained the patient had respiratory failure and became apneic never lost a pulse of blood pressure but quit breathing requiring bagging and and subsequent intubation by the ER physician.   Cardiac cath was performed and was not very different from 2017 depressed left ventricular function around 35% mainly inferiorly but globally as well left ventricular enlargement.   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 Total occlusion of vein graft to RCA circumflex system and diagonal.   Patient arrives to ICU with severe multiorgan failure   Assessment & Plan:   Active Problems:   STEMI (ST elevation myocardial infarction) ([place])   Pressure ulcer   COVID19 pneumonia: completed IV remdesivir. Continue on vit c and zinc. D/c steroids  secondary to STEMI & poorly controlled DM2. Encourage incentive spirometry   STEMI: s/p cardiac cath but unable to place stents. Continue w/ medical management as per cardio. D/c IV heparin  & continue on aspirin as per cardio. Will continue on ranexa but hold for MAP <65. Nitro prn. Cardio following and recs apprec  Hypotension: resolved. Weaned off of levophed. Will restart home dose of ranexa. Keep MAP >65   DM2: poorly controlled. Continue on SSI w/ accuchecks   GERD: continue on famotidine   Seizures: continue on keppra, lamictal   Normocytic anemia: no need for a transfusion at this time but H&H is trending down slightly each day. Fecal occult ordered Will continue to monitor   Generalized weakness: PT/OT recs SNF   DVT prophylaxis: SCDs Code Status: DNR Family Communication: discussed pt's care w/ pt's son, Elenore Rota, and answered his questions. Please call Elenore Rota daily for updates  Disposition Plan: PT/OT recs SNF but pt's declines but is ok w/ home health. Will place home health orders   Consultants:   Cardio    Procedures:    Antimicrobials:   Status is: Inpatient  Remains inpatient appropriate because:Hemodynamically unstable, monitoring BP w/ restarting of BP/cardiac meds as pt had a STEMI    Dispo: The patient is from: Home              Anticipated d/c is to: Home w/ home health               Anticipated d/c date is: 2 days              Patient currently is not medically  stable to d/c.    Subjective: Pt c/o malaise   Objective: Vitals:   01/01/20 1800 01/01/20 1830 01/01/20 2000 01/02/20 0200  BP: (!) 158/73 (!) 156/69    Pulse: 74 72    Resp: 17 14    Temp:   (!) 96.5 F (35.8 C) (!) 97 F (36.1 C)  TempSrc:   Axillary Axillary  SpO2: 100% 100%      Intake/Output Summary (Last 24 hours) at 01/02/2020 0817 Last data filed at 01/02/2020 0600 Gross per 24 hour  Intake 1277.35 ml  Output --  Net 1277.35 ml   There were no vitals filed for this  visit.  Examination:  General exam: Appears calm and comfortable. Frail appearing  Respiratory system: diminished breath sounds b/l. No wheezes, rales Cardiovascular system: S1 & S2 + No rubs, gallops or clicks.  Gastrointestinal system: Abdomen is nondistended, soft and nontender.  Normal bowel sounds heard. Central nervous system: Alert and oriented. Moves all 4 extremities  Psychiatry: Judgement and insight appear normal. Flat mood and affect.     Data Reviewed: I have personally reviewed following labs and imaging studies  CBC: Recent Labs  Lab 12/28/19 1601 12/28/19 1601 12/29/19 0106 12/30/19 0721 12/31/19 0123 01/01/20 0441 01/02/20 0604  WBC 12.1*   < > 9.3 12.2* 9.1 7.6 6.4  NEUTROABS 10.1*  --  8.4* 10.5* 7.0 5.1  --   HGB 8.2*   < > 8.0* 9.2* 8.6* 7.7* 7.3*  HCT 26.5*   < > 25.5* 29.0* 27.6* 25.0* 23.9*  MCV 98.5   < > 95.9 95.7 96.8 98.0 98.4  PLT 310   < > 224 236 205 185 180   < > = values in this interval not displayed.   Basic Metabolic Panel: Recent Labs  Lab 12/29/19 0106 12/29/19 1009 12/30/19 0721 12/31/19 0123 01/01/20 0441 01/02/20 0604  NA 133*  --  135 137  137 138 137  K 3.4*  --  3.7 3.7  3.6 3.8 4.1  CL 100  --  103 108  108 111 113*  CO2 19*  --  19* 21*  22 19* 18*  GLUCOSE 228*  --  144* 207*  183* 89 78  BUN 25*  --  26* 40*  40* 33* 30*  CREATININE 0.94  --  0.82 0.96  0.97 0.82 0.82  CALCIUM 8.9  --  8.5* 8.8*  8.8* 8.3* 8.2*  MG  --  1.4* 2.0 2.1  --   --   PHOS  --   --  5.0*  --  3.2  --    GFR: CrCl cannot be calculated (Unknown ideal weight.). Liver Function Tests: Recent Labs  Lab 12/28/19 1601 12/29/19 0106 12/30/19 0721 12/31/19 0123 01/01/20 0441  AST 29 48* 44* 30 22  ALT 23 31 35 29 19  ALKPHOS 73 73 64 61 62  BILITOT 0.9 0.9 0.7 0.5 0.7  PROT 7.9 7.8 7.4 7.3 6.2*  ALBUMIN 4.4 4.0 3.7 3.7 3.1*   No results for input(s): LIPASE, AMYLASE in the last 168 hours. No results for input(s): AMMONIA in  the last 168 hours. Coagulation Profile: Recent Labs  Lab 12/28/19 1601  INR 1.2   Cardiac Enzymes: No results for input(s): CKTOTAL, CKMB, CKMBINDEX, TROPONINI in the last 168 hours. BNP (last 3 results) No results for input(s): PROBNP in the last 8760 hours. HbA1C: No results for input(s): HGBA1C in the last 72 hours. CBG: Recent Labs  Lab 01/01/20 1642 01/01/20  2017 01/02/20 0038 01/02/20 0354 01/02/20 0802  GLUCAP 209* 208* 122* 84 85   Lipid Profile: Recent Labs    12/31/19 1849  TRIG 137   Thyroid Function Tests: No results for input(s): TSH, T4TOTAL, FREET4, T3FREE, THYROIDAB in the last 72 hours. Anemia Panel: No results for input(s): VITAMINB12, FOLATE, FERRITIN, TIBC, IRON, RETICCTPCT in the last 72 hours. Sepsis Labs: No results for input(s): PROCALCITON, LATICACIDVEN in the last 168 hours.  Recent Results (from the past 240 hour(s))  Respiratory Panel by RT PCR (Flu A&B, Covid) - Nasopharyngeal Swab     Status: Abnormal   Collection Time: 12/28/19  4:01 PM   Specimen: Nasopharyngeal Swab  Result Value Ref Range Status   SARS Coronavirus 2 by RT PCR POSITIVE (A) NEGATIVE Final    Comment: RESULT CALLED TO, READ BACK BY AND VERIFIED WITH: DR.COVERT(CATH) AT 1707 ON 12/28/2019 BY MOSLEY,J (NOTE) SARS-CoV-2 target nucleic acids are DETECTED. SARS-CoV-2 RNA is generally detectable in upper respiratory specimens  during the acute phase of infection. Positive results are indicative of the presence of the identified virus, but do not rule out bacterial infection or co-infection with other pathogens not detected by the test. Clinical correlation with patient history and other diagnostic information is necessary to determine patient infection status. The expected result is Negative. Fact Sheet for Patients:  PinkCheek.be Fact Sheet for Healthcare Providers: GravelBags.it This test is not yet approved or  cleared by the [place] FDA and  has been authorized for detection and/or diagnosis of SARS-CoV-2 by FDA under an Emergency Use Authorization (EUA).  This EUA will remain in effect (meaning this test ca n be used) for the duration of  the COVID-19 declaration under Section 564(b)(1) of the Act, 21 U.S.C. section 360bbb-3(b)(1), unless the authorization is terminated or revoked sooner.    Influenza A by PCR NEGATIVE NEGATIVE Final   Influenza B by PCR NEGATIVE NEGATIVE Final    Comment: (NOTE) The Xpert Xpress SARS-CoV-2/FLU/RSV assay is intended as an aid in  the diagnosis of influenza from Nasopharyngeal swab specimens and  should not be used as a sole basis for treatment. Nasal washings and  aspirates are unacceptable for Xpert Xpress SARS-CoV-2/FLU/RSV  testing. Fact Sheet for Patients: PinkCheek.be Fact Sheet for Healthcare Providers: GravelBags.it This test is not yet approved or cleared by the [place] FDA and  has been authorized for detection and/or diagnosis of SARS-CoV-2 by  FDA under an Emergency Use Authorization (EUA). This EUA will remain  in effect (meaning this test can be used) for the duration of the  Covid-19 declaration under Section 564(b)(1) of the Act, 21  U.S.C. section 360bbb-3(b)(1), unless the authorization is  terminated or revoked. Performed at [facility], [address], [place], [place] [address]   MRSA PCR Screening     Status: None   Collection Time: 12/28/19  6:16 PM   Specimen: Urine, Catheterized; Nasopharyngeal  Result Value Ref Range Status   MRSA by PCR NEGATIVE NEGATIVE Final    Comment:        The GeneXpert MRSA Assay (FDA approved for NASAL specimens only), is one component of a comprehensive MRSA colonization surveillance program. It is not intended to diagnose MRSA infection nor to guide or monitor treatment for MRSA infections. Performed at [facility], [address], [place], [place] [address]          Radiology Studies: No results found.      Scheduled Meds: . vitamin C  500 mg Oral Daily  . aspirin  81 mg Oral Daily  . brimonidine  1 drop Both Eyes BID  . brinzolamide  1 drop Both Eyes BID  . chlorhexidine gluconate (MEDLINE KIT)  15 mL Mouth Rinse BID  . Chlorhexidine Gluconate Cloth  6 each Topical Daily  . docusate  100 mg Oral BID  . doxycycline  100 mg Oral Q12H  . insulin aspart  0-20 Units Subcutaneous Q4H  . lamoTRIgine  100 mg Oral Daily  . polyethylene glycol  17 g Oral Daily  . ranolazine  500 mg Oral BID  . sodium chloride flush  3 mL Intravenous Q12H  . timolol  1 drop Both Eyes BID  . zinc sulfate  220 mg Oral Daily   Continuous Infusions: . sodium chloride 10 mL/hr at 01/01/20 0600  . sodium chloride 10 mL/hr at 12/29/19 0600  . famotidine (PEPCID) IV 20 mg (01/01/20 2146)  . heparin 750 Units/hr (01/02/20 0600)  . levETIRAcetam 1,500 mg (01/02/20 0024)  . norepinephrine (LEVOPHED) Adult infusion Stopped (12/31/19 2317)     LOS: 5 days    Time spent: 31 mins     Wyvonnia Dusky, MD [facility] Pager [phone]  If 7PM-7AM, please contact night-coverage [email or web address] 01/02/2020, 8:17 AM

## 2020-01-02 NOTE — Progress Notes (Signed)
Ntg 0.4 mg  given sl for 8/10 chest pressure.  O2 placed at 2 liters for comfort.

## 2020-01-03 ENCOUNTER — Encounter: Payer: Self-pay | Admitting: Internal Medicine

## 2020-01-03 DIAGNOSIS — Z7189 Other specified counseling: Secondary | ICD-10-CM

## 2020-01-03 DIAGNOSIS — Z515 Encounter for palliative care: Secondary | ICD-10-CM

## 2020-01-03 DIAGNOSIS — J9601 Acute respiratory failure with hypoxia: Secondary | ICD-10-CM

## 2020-01-03 DIAGNOSIS — Z9289 Personal history of other medical treatment: Secondary | ICD-10-CM

## 2020-01-03 LAB — BASIC METABOLIC PANEL
Anion gap: 6 (ref 5–15)
BUN: 32 mg/dL — ABNORMAL HIGH (ref 8–23)
CO2: 19 mmol/L — ABNORMAL LOW (ref 22–32)
Calcium: 8.6 mg/dL — ABNORMAL LOW (ref 8.9–10.3)
Chloride: 110 mmol/L (ref 98–111)
Creatinine, Ser: 0.87 mg/dL (ref 0.44–1.00)
GFR calc Af Amer: >60 mL/min (ref >60)
GFR calc non Af Amer: >60 mL/min (ref >60)
Glucose, Bld: 120 mg/dL — ABNORMAL HIGH (ref 70–99)
Potassium: 4.8 mmol/L (ref 3.5–5.1)
Sodium: 135 mmol/L (ref 135–145)

## 2020-01-03 LAB — CBC
HCT: 22.6 % — ABNORMAL LOW (ref 36.0–46.0)
Hemoglobin: 7 g/dL — ABNORMAL LOW (ref 12.0–15.0)
MCH: 29.5 pg (ref 26.0–34.0)
MCHC: 31 g/dL (ref 30.0–36.0)
MCV: 95.4 fL (ref 80.0–100.0)
Platelets: 175 10*3/uL (ref 150–400)
RBC: 2.37 MIL/uL — ABNORMAL LOW (ref 3.87–5.11)
RDW: 17.6 % — ABNORMAL HIGH (ref 11.5–15.5)
WBC: 7 10*3/uL (ref 4.0–10.5)
nRBC: 0 % (ref 0.0–0.2)

## 2020-01-03 LAB — GLUCOSE, CAPILLARY
Glucose-Capillary: 111 mg/dL — ABNORMAL HIGH (ref 70–99)
Glucose-Capillary: 115 mg/dL — ABNORMAL HIGH (ref 70–99)
Glucose-Capillary: 129 mg/dL — ABNORMAL HIGH (ref 70–99)
Glucose-Capillary: 309 mg/dL — ABNORMAL HIGH (ref 70–99)

## 2020-01-03 LAB — PREPARE RBC (CROSSMATCH)

## 2020-01-03 LAB — ABO/RH: ABO/RH(D): A POS

## 2020-01-03 MED ORDER — ZINC SULFATE 220 (50 ZN) MG PO CAPS: 220.0000 mg | ORAL_CAPSULE | Freq: Every day | ORAL | 0 refills | Status: DC

## 2020-01-03 MED ORDER — ENOXAPARIN SODIUM 40 MG/0.4ML ~~LOC~~ SOLN: 40.0000 mg | SUBCUTANEOUS | Status: DC

## 2020-01-03 MED ORDER — ENSURE ENLIVE PO LIQD: 237.0000 mL | Freq: Three times a day (TID) | ORAL | Status: DC

## 2020-01-03 MED ORDER — ASCORBIC ACID 500 MG PO TABS: 500.0000 mg | ORAL_TABLET | Freq: Every day | ORAL | 0 refills | Status: DC

## 2020-01-03 MED ORDER — ADULT MULTIVITAMIN W/MINERALS CH: 1.0000 | ORAL_TABLET | Freq: Every day | ORAL | Status: DC

## 2020-01-03 MED ORDER — SODIUM CHLORIDE 0.9% IV SOLUTION: Freq: Once | INTRAVENOUS | Status: AC

## 2020-01-03 NOTE — Consult Note (Signed)
Consultation Note Date: 01/03/2020   Patient Name: Leah Small  DOB: [birth year]  MRN: [id number]  Age / Sex: 79 y.o., female  PCP: Idelle Crouch, MD Referring Physician: Max Sane, MD  Reason for Consultation: Establishing goals of care and Psychosocial/spiritual support  HPI/Patient Profile: 79 y.o. female, COVID-19 positive, with past medical history of CABG 1997, NSTEMI with last cath 2017, cath March 2017 with stenting, June 2017, November 2017, chronic stable angina, CAD, HTN/HLD, TIA, psoriatic arthritis, DM, admitted on 12/28/2019 with NSTEMI, emergency cath 5/6 not very different from 2017, depressed left ventricular function 35%, severe multivessel native disease with occlusion proximal of the LAD circumflex and RCA total occlusion of vein graft to the RCA circumflex and diagonal.  During cardiac cath procedure patient experienced respiratory failure and became apneic, although she never lost pulse or blood pressure.  Stop breathing requiring bagging and intubation.  Transition to the ICU with severe multiorgan failure.  She was extubated 5/7 to room air, out of ICU on 5/11.    Clinical Assessment and Goals of Care: I have reviewed medical records including EPIC notes, labs and imaging, received report from transition of care team.  Mrs. Rosenkranz is on airborne precautions for COVID-19.  Call made to her room, no answer.  PMT will continue to reach out to patient/family to discuss diagnosis prognosis, GOC, EOL wishes, disposition and options.  Per transitional care team notes, Mrs. Polack lives in her own home with her adult son Elta Guadeloupe and his girlfriend.  She also has another son Elenore Rota who is involved in supportive, assisting with appointments and managing care.    Mrs. Slaubaugh has been qualified for short-term rehab, but declines both inpatient rehab and home health services.  Mrs. Heffner echo  cardiogram showing severe left ventricular systolic dysfunction with an ejection fraction of 15 to 20% after cardiac cath which revealed diffuse coronary artery disease, unchanged from cath of 2017 with no amenable interventions.  She would qualify for at home, "treat the treatable", hospice care, based on low EF.  Currently DNR status.  PMT to continue to reach out to patient.  HCPOA    NEXT OF KIN -2 adult sons, Elenore Rota and Aubrea Meixner.    SUMMARY OF RECOMMENDATIONS   Continue to treat the treatable but no CPR or intubation. Would qualify for at home, treat the treatable hospice care.   Hospice services have NOT been discussed with patient.   Code Status/Advance Care Planning:  DNR  Symptom Management:   Per hospitalist, no additional needs at this time.   Palliative Prophylaxis:   Oral Care  Additional Recommendations (Limitations, Scope, Preferences):  treat the treatable, but no CPR or intubation   Psycho-social/Spiritual:   Desire for further Chaplaincy support:no  Additional Recommendations: Caregiving  Support/Resources and Education on Hospice  Prognosis:   < 6 months, or less is expected based on chronic illness burden, in particular cardiac disease.  Discharge Planning: Patient declines rehab or home health.  Would encourage outpatient palliative  Primary Diagnoses: Present on Admission: . STEMI (ST elevation myocardial infarction) ([place]) . Pressure ulcer   I have reviewed the medical record, interviewed the patient and family, and examined the patient. The following aspects are pertinent.  Past Medical History:  Diagnosis Date  . Acid reflux   . Arthritis    "all over"  . Chronic stable angina ([place])   . Coronary artery disease   . Hypercholesterolemia   . Hypertension   . Myocardial infarction ([place]) 1991; 07/06/2015  . NSTEMI (non-ST elevated myocardial infarction) ([place]) 10/22/2015  . Psoriatic arthritis ([place])   . Seizures ([place])     "complex partial; 1st one was 07/06/2015; might have had one today" (10/22/2015)  . TIA (transient ischemic attack)    "several over the last 20 years" (10/22/2015)  . Type II diabetes mellitus ([place])    Social History   Socioeconomic History  . Marital status: Widowed    Spouse name: Not on file  . Number of children: Not on file  . Years of education: Not on file  . Highest education level: Not on file  Occupational History  . Not on file  Tobacco Use  . Smoking status: Never Smoker  . Smokeless tobacco: Never Used  Substance and Sexual Activity  . Alcohol use: No  . Drug use: No  . Sexual activity: Never  Other Topics Concern  . Not on file  Social History Narrative   Lives at home with one of her sons. Independent at baseline.   Social Determinants of Health   Financial Resource Strain:   . Difficulty of Paying Living Expenses:   Food Insecurity:   . Worried About Charity fundraiser in the Last Year:   . Arboriculturist in the Last Year:   Transportation Needs:   . Film/video editor (Medical):   [name] Lack of Transportation (Non-Medical):   Physical Activity:   . Days of Exercise per Week:   . Minutes of Exercise per Session:   Stress:   . Feeling of Stress :   Social Connections:   . Frequency of Communication with Friends and Family:   . Frequency of Social Gatherings with Friends and Family:   . Attends Religious Services:   . Active Member of Clubs or Organizations:   . Attends Archivist Meetings:   [name] Marital Status:    Family History  Problem Relation Age of Onset  . Heart attack Father   . Diabetes Mother   . Cancer Mother   . Stroke Mother   . Breast cancer Cousin   . Breast cancer Cousin   . Breast cancer Cousin    Scheduled Meds: . vitamin C  500 mg Oral Daily  . aspirin  81 mg Oral Daily  . brimonidine  1 drop Both Eyes BID  . brinzolamide  1 drop Both Eyes BID  . chlorhexidine gluconate (MEDLINE KIT)  15 mL Mouth Rinse BID  .  Chlorhexidine Gluconate Cloth  6 each Topical Daily  . docusate  100 mg Oral BID  . enoxaparin (LOVENOX) injection  40 mg Subcutaneous Q24H  . feeding supplement (ENSURE ENLIVE)  237 mL Oral TID BM  . insulin aspart  0-20 Units Subcutaneous Q4H  . lamoTRIgine  100 mg Oral Daily  . [START ON 01/04/2020] multivitamin with minerals  1 tablet Oral Daily  . polyethylene glycol  17 g Oral Daily  . ranolazine  500 mg Oral BID  . sodium chloride flush  3 mL Intravenous Q12H  . timolol  1 drop Both Eyes BID  . zinc sulfate  220 mg Oral Daily   Continuous Infusions: . sodium chloride 10 mL/hr at 01/02/20 1500  . sodium chloride 250 mL (01/03/20 0034)  . famotidine (PEPCID) IV Stopped (01/02/20 1013)  . levETIRAcetam 1,500 mg (01/03/20 0037)   PRN Meds:.sodium chloride, acetaminophen, ipratropium-albuterol, nitroGLYCERIN, ondansetron (ZOFRAN) IV, sodium chloride flush Medications Prior to Admission:  Prior to Admission medications   Medication Sig Start Date End Date Taking? Authorizing Provider  aspirin EC 81 MG tablet Take 81 mg by mouth at bedtime.   Yes [provider]  atorvastatin (LIPITOR) 40 MG tablet Take 40 mg by mouth daily. 12/04/19  Yes [provider]  clopidogrel (PLAVIX) 75 MG tablet Take 1 tablet (75 mg total) by mouth every morning. Patient taking differently: Take 75 mg by mouth daily.  08/14/17  Yes Gladstone Lighter, MD  diltiazem (DILACOR XR) 240 MG 24 hr capsule Take 240 mg by mouth daily.   Yes [provider]  glipiZIDE (GLUCOTROL XL) 5 MG 24 hr tablet Take 5 mg by mouth daily with breakfast.   Yes [provider]  isosorbide mononitrate (IMDUR) 30 MG 24 hr tablet Take 1 tablet (30 mg total) by mouth daily. 08/14/17  Yes Gladstone Lighter, MD  lamoTRIgine (LAMICTAL) 100 MG tablet Take 100 mg by mouth daily. 11/13/19  Yes [provider]  levETIRAcetam (KEPPRA) 750 MG tablet Take 750 mg by mouth 2 (two) times daily.  11/28/19  Yes  [provider]  metFORMIN (GLUCOPHAGE) 1000 MG tablet Take 1 tablet (1,000 mg total) by mouth 2 (two) times daily. 01/30/16  Yes Theora Gianotti, NP  metoprolol tartrate (LOPRESSOR) 25 MG tablet Take 1 tablet (25 mg total) by mouth at bedtime. 07/23/16  Yes Jettie Booze E, NP  ramipril (ALTACE) 5 MG capsule Take 5 mg by mouth daily. 10/19/19  Yes [provider]  ranitidine (ZANTAC) 150 MG tablet Take 150 mg by mouth every morning.    Yes [provider]  ranolazine (RANEXA) 500 MG 12 hr tablet Take 1 tablet (500 mg total) by mouth 2 (two) times daily. 08/14/17 12/29/19 Yes Gladstone Lighter, MD  SIMBRINZA 1-0.2 % SUSP Place 1 drop into both eyes 2 (two) times daily.  11/29/19  Yes [provider]  timolol (TIMOPTIC) 0.5 % ophthalmic solution Place 1 drop into both eyes 2 (two) times daily. 06/14/17  Yes [provider]  vitamin B-12 (CYANOCOBALAMIN) 1000 MCG tablet Take 500 mcg by mouth every morning.    Yes [provider]  albuterol (PROVENTIL HFA;VENTOLIN HFA) 108 (90 Base) MCG/ACT inhaler Inhale 2 puffs into the lungs every 6 (six) hours as needed for wheezing or shortness of breath. Patient not taking: Reported on 12/18/2019 12/17/17   Darel Hong, MD  gabapentin (NEURONTIN) 100 MG capsule Take 100-200 mg by mouth daily as needed.    [provider]  guaiFENesin-dextromethorphan (ROBITUSSIN DM) 100-10 MG/5ML syrup Take 5 mLs every 4 (four) hours as needed by mouth for cough. 06/29/17   Fritzi Mandes, MD  Insulin Glargine (LANTUS SOLOSTAR) 100 UNIT/ML Solostar Pen Inject 18 Units into the skin at bedtime. Patient not taking: Reported on 12/18/2019 08/14/17   Gladstone Lighter, MD  nitroGLYCERIN (NITROSTAT) 0.4 MG SL tablet Place 0.4 mg under the tongue every 5 (five) minutes as needed for chest pain.    [provider]  nystatin cream (MYCOSTATIN) Apply 1 application topically 2 (  two) times daily as needed (yeast  infection).  04/25/15   [provider]  senna-docusate (SENOKOT-S) 8.6-50 MG tablet Take 1 tablet 2 (two) times daily by mouth. 06/29/17   Tukov-Yual, Arlyss Gandy, NP  triamcinolone ointment (KENALOG) 0.1 % Apply 1 application topically 3 (three) times daily as needed (itching from psoriasis).    [provider]  vitamin E 400 UNIT capsule Take 400 Units by mouth every morning.     [provider]   Allergies  Allergen Reactions  . Talwin [Pentazocine] Anaphylaxis and Swelling    Throat swelling  . Valium [Diazepam] Other (See Comments)    Makes her feel like she's flying  . Vicodin [Hydrocodone-Acetaminophen] Nausea And Vomiting  . Adhesive [Tape] Other (See Comments)    Bruising and TEARING!!!  Please use "paper" tape  . Beta Adrenergic Blockers Other (See Comments)    Pt states she not allergic  . Levofloxacin Swelling and Other (See Comments)    Tongue swells   . Simvastatin Swelling and Rash    Mouth swelling   Review of Systems  Unable to perform ROS: Acuity of condition    Physical Exam Vitals and nursing note reviewed.     Vital Signs: BP (!) 94/47   Pulse 67   Temp 97.6 F (36.4 C) (Axillary)   Resp 18   Ht 5' 3" (1.6 m)   Wt 57.4 kg   SpO2 100%   BMI 22.43 kg/m  Pain Scale: 0-10 POSS *See Group Information*: 1-Acceptable,Awake and alert Pain Score: 0-No pain   SpO2: SpO2: 100 % O2 Device:SpO2: 100 % O2 Flow Rate: .O2 Flow Rate (L/min): 2 L/min  IO: Intake/output summary:   Intake/Output Summary (Last 24 hours) at 01/03/2020 1350 Last data filed at 01/03/2020 0756 Gross per 24 hour  Intake 636.73 ml  Output 0 ml  Net 636.73 ml    LBM: Last BM Date: 01/02/20 Baseline Weight: Weight: 57.4 kg Most recent weight: Weight: 57.4 kg     Palliative Assessment/Data:   Flowsheet Rows     Most Recent Value  Intake Tab  Referral Department  Hospitalist  Unit at Time of Referral  Intermediate Care Unit  Palliative Care Primary  Diagnosis  Cardiac  Date Notified  01/03/20  Palliative Care Type  New Palliative care  Reason for referral  Clarify Goals of Care  Date of Admission  12/28/19  Date first seen by Palliative Care  01/03/20  # of days Palliative referral response time  0 Day(s)  # of days IP prior to Palliative referral  6  Clinical Assessment  Palliative Performance Scale Score  30%  Pain Max last 24 hours  Not able to report  Pain Min Last 24 hours  Not able to report  Dyspnea Max Last 24 Hours  Not able to report  Dyspnea Min Last 24 hours  Not able to report  Psychosocial & Spiritual Assessment  Palliative Care Outcomes      Time In: 1340 Time Out: 1410 Time Total: 30 minutes  Greater than 50%  of this time was spent counseling and coordinating care related to the above assessment and plan.  Signed by: Drue Novel, NP   Please contact Palliative Medicine Team phone at [phone] for questions and concerns.  For individual provider: See Shea Evans

## 2020-01-03 NOTE — Progress Notes (Signed)
PT Cancellation Note  Patient Details Name: Leah Small MRN: [id number] DOB: [birth year]   Cancelled Treatment:     Per PT protocol will hold PT until after transfusion. Pt current HgB 7.0. Acute PT will continue to follow per  POC.    Rushie Chestnut 01/03/2020, 8:59 AM

## 2020-01-03 NOTE — TOC Progression Note (Signed)
Transition of Care [facility]) - Progression Note    Patient Details  Name: ADDILYNNE OLHEISER MRN: [id number] Date of Birth: 1940-09-22  Transition of Care [facility]) CM/SW Contact  Maree Krabbe, LCSW Phone Number: 01/03/2020, 10:24 AM  Clinical Narrative:   Pt has refused both SNF and HH.    Expected Discharge Plan: Home/Self Care Barriers to Discharge: Continued Medical Work up  Expected Discharge Plan and Services Expected Discharge Plan: Home/Self Care       Living arrangements for the past 2 months: Single Family Home                                       Social Determinants of Health (SDOH) Interventions    Readmission Risk Interventions No flowsheet data found.

## 2020-01-03 NOTE — Progress Notes (Signed)
Initial Nutrition Assessment  DOCUMENTATION CODES:   Not applicable  INTERVENTION:   Ensure Enlive po TID, each supplement provides 350 kcal and 20 grams of protein  MVI daily   NUTRITION DIAGNOSIS:   Increased nutrient needs related to catabolic illness(COVID 19) as evidenced by increased estimated needs.  GOAL:   Patient will meet greater than or equal to 90% of their needs  MONITOR:   PO intake, Labs, Weight trends, Skin, Supplement acceptance, I & O's  REASON FOR ASSESSMENT:   Malnutrition Screening Tool    ASSESSMENT:   79 year old female with past medical history of CAD, non-STEMI, hypertension, hyperlipidemia, diabetes mellitus, TIA, GERD, seizure, who was admitted to ICU due to STEMI and COVID-19 pneumonia.   Spoke with pt via phone. Pt reports decreased appetite and oral intake pta and during the first part of her admission but reports that her appetite is improved now. Pt reports "I reckon I am doing pretty good". Pt is documented to be eating 50-75% of meals in hospital. Pt reports that she enjoys chocolate Ensure and Glucerna and she drinks these at home. RD will add supplements and MVI in hospital to help pt meet her estimated needs and support wound healing. Pt seen by SLP and placed on a dysphagia 3/thin liquid diet. Per chart pt appears fairly weight stable at baseline; pt's UBW is around 120-125lbs. Palliative care consult pending.   Medications reviewed and include: vitamin C, aspirin, colace, insulin, miralax, zinc, pepcid  Labs reviewed: BUN 32(H) Hgb 7.0(L), Hct 22.6(L) cbgs- 111, 129, 115 x 24hrs AIC 6.1(H)- 5/6  NUTRITION - FOCUSED PHYSICAL EXAM: Unable to perform at this time as pt with COVID 19  Diet Order:   Diet Order            DIET DYS 3 Room service appropriate? Yes with Assist; Fluid consistency: Thin  Diet effective now             EDUCATION NEEDS:   Education needs have been addressed  Skin:  Skin Assessment: Reviewed RN  Assessment(ecchymosis, Stage III buttocks)  Last BM:  5/11- type 2  Height:   Ht Readings from Last 1 Encounters:  01/02/20 5\' 3"  (1.6 m)    Weight:   Wt Readings from Last 1 Encounters:  01/02/20 57.4 kg    Ideal Body Weight:  52.3 kg  BMI:  Body mass index is 22.43 kg/m.  Estimated Nutritional Needs:   Kcal:  1400-1600kcal/day  Protein:  70-80g/day  Fluid:  >1.4L/day  03/03/20 MS, RD, LDN Please refer to [facility] for RD and/or RD on-call/weekend/after hours pager

## 2020-01-03 NOTE — Progress Notes (Signed)
  Speech Language Pathology Treatment: Dysphagia  Patient Details Name: Leah Small MRN: [id number] DOB: 1941-01-28 Today's Date: 01/03/2020 Time: 5329-9242 SLP Time Calculation (min) (ACUTE ONLY): 40 min  Assessment / Plan / Recommendation Clinical Impression  Pt seen today for ongoing assessment of swallowing, toleration of diet. Pt has been on a mech soft diet w/ thin liquids and tolerating this diet well per NSG report. Pt appears much improved in strength and appearance w/ clear, and strong, vocal quality. Pt stated she is eating and drinking "well". She requested "hot coffee".  Discussed w/ pt the importance of sitting Fully upright, even in a chair, for oral intake, meals. Discussed this precaution, and other general aspiration precautions, to increase her safety during oral intake and reduce risk for aspiration. Pt agreed and verbally repeated back the general aspiration precautions discussed. Pt then consumed po trials of thin liquids via Cup w/ No overt, clinical s/s of aspiration during/post po trials. Vocal quality was clear b/t trials. Oral phase appeared [facility] w/ timely bolus management and control of bolus propulsion for A-P transfer for swallowing. Pt fed self w/ setup support.  Recommend continue a mech soft consistency diet w/ Minced meats, moistened foods; Thin liquidsvia Cup; recommend general aspiration precautions, Pills WHOLE in Puree for safer, easier swallowing. Support at meals sitting in a chair as much as possible. NSG to reconsult if any new issues while admitted. NSG agreed.   HPI HPI: Pt is a 79 y.o. female with a history of Multiple medical issues including GERD, Memory deficits, arthritis, chronic stable angina, coronary disease, hyperlipidemia, hypertension, MI, seizure, TIAs who presents to the ED for central chest pain that radiated to the left arm beginning at 11:30 AM. Patient with 10 out of 10 chest pain on arrival. Pt was taken to the Cath Lab with subsequent  deterioration and oral intubation; extubated on 12/30/2019. Pt has Baseline Memory Deficits per chart.       SLP Plan  All goals met       Recommendations  Diet recommendations: Dysphagia 3 (mechanical soft);Thin liquid(moistened foods; cut meat/foods) Liquids provided via: Cup Medication Administration: Whole meds with puree(for easier, safer swallowing) Supervision: Patient able to self feed(setup support) Compensations: Minimize environmental distractions;Slow rate;Small sips/bites;Follow solids with liquid Postural Changes and/or Swallow Maneuvers: Seated upright 90 degrees;Upright 30-60 min after meal                General recommendations: (dietician f/u as needed) Oral Care Recommendations: Oral care BID;Oral care before and after PO;Patient independent with oral care Follow up Recommendations: None SLP Visit Diagnosis: Dysphagia, unspecified (R13.10) Plan: All goals met       GO                Orinda Kenner, MS, CCC-SLP Ebenezer Mccaskey 01/03/2020, 4:04 PM

## 2020-01-03 NOTE — Discharge Instructions (Signed)
Heart Attack A heart attack occurs when blood and oxygen supply to the heart is cut off. A heart attack causes damage to the heart that cannot be fixed. A heart attack is also called a myocardial infarction, or MI. If you think you are having a heart attack, do not wait to see if the symptoms will go away. Get medical help right away. What are the causes? This condition may be caused by:  A fatty substance (plaque) in the blood vessels (arteries). This can block the flow of blood to the heart.  A blood clot in the blood vessels that go to the heart. The blood clot blocks blood flow.  Low blood pressure.  An abnormal heartbeat.  Some diseases, such as problems in red blood cells (anemia)orproblems in breathing (respiratory failure).  Tightening (spasm) of a blood vessel that cuts off blood to the heart.  A tear in a blood vessel of the heart.  High blood pressure. What increases the risk? The following factors may make you more likely to develop this condition:  Aging. The older you are, the higher your risk.  Having a personal or family history of chest pain, heart attack, stroke, or narrowing of the arteries in the legs, arms, head, or stomach (peripheral artery disease).  Being female.  Smoking.  Not getting regular exercise.  Being overweight or obese.  Having high blood pressure.  Having high cholesterol.  Having diabetes.  Drinking too much alcohol.  Using illegal drugs, such as cocaine or methamphetamine. What are the signs or symptoms? Symptoms of this condition include:  Chest pain. It may feel like: ? Crushing or squeezing. ? Tightness, pressure, fullness, or heaviness.  Pain in the arm, neck, jaw, back, or upper body.  Shortness of breath.  Heartburn.  Upset stomach (indigestion).  Feeling like you may vomit (nauseous).  Cold sweats.  Feeling tired.  Sudden light-headedness. How is this treated? A heart attack must be treated as soon as  possible. Treatment may include:  Medicines to: ? Break up or dissolve blood clots. ? Thin blood and help prevent blood clots. ? Treat blood pressure. ? Improve blood flow to the heart. ? Reduce pain. ? Reduce cholesterol.  Procedures to widen a blocked artery and keep it open.  Open heart surgery.  Receiving oxygen.  Making your heart strong again (cardiac rehabilitation) through exercise, education, and counseling. Follow these instructions at home: Medicines  Take over-the-counter and prescription medicines only as told by your doctor. You may need to take medicine: ? To keep your blood from clotting too easily. ? To control blood pressure. ? To lower cholesterol. ? To control heart rhythms.  Do not take these medicines unless your doctor says it is okay: ? NSAIDs, such as ibuprofen. ? Supplements that have vitamin A, vitamin E, or both. ? Hormone replacement therapy that has estrogen with or without progestin. Lifestyle      Do not use any products that have nicotine or tobacco, such as cigarettes, e-cigarettes, and chewing tobacco. If you need help quitting, ask your doctor.  Avoid secondhand smoke.  Exercise regularly. Ask your doctor about a cardiac rehab program.  Eat heart-healthy foods. Your doctor will tell you what foods to eat.  Stay at a healthy weight.  Lower your stress level.  Do not use illegal drugs. Alcohol use  Do not drink alcohol if: ? Your doctor tells you not to drink. ? You are pregnant, may be pregnant, or are planning to become pregnant.    If you drink alcohol: ? Limit how much you use to:  0-1 drink a day for women.  0-2 drinks a day for men. ? Know how much alcohol is in your drink. In the [place] one drink equals one 12 oz bottle of beer (355 mL), one 5 oz glass of wine (148 mL), or one 1 oz glass of hard liquor (44 mL). General instructions  Work with your doctor to treat other problems you may have, such as diabetes or high  blood pressure.  Get screened for depression. Get treatment if needed.  Keep your vaccines up to date. Get the flu shot (influenza vaccine) every year.  Keep all follow-up visits as told by your doctor. This is important. Contact a doctor if:  You feel very sad.  You have trouble doing your daily activities. Get help right away if:  You have sudden, unexplained discomfort in your chest, arms, back, neck, jaw, or upper body.  You have shortness of breath.  You have sudden sweating or clammy skin.  You feel like you may vomit.  You vomit.  You feel tired or weak.  You get light-headed or dizzy.  You feel your heart beating fast.  You feel your heart skipping beats.  You have blood pressure that is higher than 180/120. These symptoms may be an emergency. Do not wait to see if the symptoms will go away. Get medical help right away. Call your local emergency services (911 in the [place] Do not drive yourself to the hospital. Summary  A heart attack occurs when blood and oxygen supply to the heart is cut off.  Do not take NSAIDs unless your doctor says it is okay.  Do not smoke. Avoid secondhand smoke.  Exercise regularly. Ask your doctor about a cardiac rehab program. This information is not intended to replace advice given to you by your health care provider. Make sure you discuss any questions you have with your health care provider. Document Revised: 11/21/2018 Document Reviewed: 11/21/2018 Elsevier Patient Education  2020 [employer]  

## 2020-01-03 NOTE — Consult Note (Addendum)
PHARMACY CONSULT NOTE  Pharmacy Consult for Electrolyte Monitoring and Replacement   Recent Labs: Potassium (mmol/L)  Date Value  01/03/2020 4.8  12/15/2014 4.2   Magnesium (mg/dL)  Date Value  94/85/4627 2.1   Calcium (mg/dL)  Date Value  03/50/0938 8.6 (L)   Calcium, Total (mg/dL)  Date Value  18/29/9371 8.6 (L)   Albumin (g/dL)  Date Value  69/67/8938 3.1 (L)  12/14/2014 3.9   Phosphorus (mg/dL)  Date Value  06/09/5101 3.2   Sodium (mmol/L)  Date Value  01/03/2020 135  12/15/2014 133 (L)    Assessment: 79 year-old female who presented with chest pain that radiated to her left arm, determined to be a STEMI. She has been extubated, transferred to the floor.   Goal of Therapy:  Potassium 4.0 - 5.1 mmol/L Magnesium 2.0 - 2.4 mg/dL All Other Electrolytes WNL   Plan:  No electrolyte replacement indicated today. Follow up with BMP with AM labs.    Ronnald Ramp, PharmD, BCPS Clinical Pharmacist 01/03/2020 7:26 AM

## 2020-01-04 ENCOUNTER — Telehealth: Payer: Self-pay | Admitting: Adult Health Nurse Practitioner

## 2020-01-04 LAB — TYPE AND SCREEN
ABO/RH(D): A POS
Antibody Screen: NEGATIVE
Unit division: 0

## 2020-01-04 LAB — BPAM RBC
Blood Product Expiration Date: 202105252359
ISSUE DATE / TIME: 202105121110
Unit Type and Rh: 6200

## 2020-01-04 NOTE — Telephone Encounter (Signed)
Spoke with patient's son Dorinda Hill regarding Palliative services and all questions were answered and he was in agreement with this.  I have scheduled a Telehealth Zoom Consult for 01/09/20 @ 3:30 PM.

## 2020-01-06 NOTE — Discharge Summary (Signed)
[facility] [place] NAME: Leah Small    MR#:  [id number]  DATE OF BIRTH:  [birth year]  DATE OF ADMISSION:  12/28/2019   ADMITTING PHYSICIAN: Yolonda Kida, MD  DATE OF DISCHARGE: 01/03/2020  3:47 PM  PRIMARY CARE PHYSICIAN: Idelle Crouch, MD   ADMISSION DIAGNOSIS:  ST elevation myocardial infarction (STEMI), unspecified artery ([place]) [I21.3] STEMI (ST elevation myocardial infarction) ([place]) [I21.3] DISCHARGE DIAGNOSIS:  Active Problems:   STEMI (ST elevation myocardial infarction) ([place])   Pressure ulcer   History of ETT   Acute respiratory failure with hypoxia ([place])   Palliative care by specialist   Goals of care, counseling/discussion  SECONDARY DIAGNOSIS:   Past Medical History:  Diagnosis Date  . Acid reflux   . Arthritis    "all over"  . Chronic stable angina ([place])   . Coronary artery disease   . Hypercholesterolemia   . Hypertension   . Myocardial infarction ([place]) 1991; 07/06/2015  . NSTEMI (non-ST elevated myocardial infarction) ([place]) 10/22/2015  . Psoriatic arthritis ([place])   . Seizures ([place])    "complex partial; 1st one was 07/06/2015; might have had one today" (10/22/2015)  . TIA (transient ischemic attack)    "several over the last 20 years" (10/22/2015)  . Type II diabetes mellitus [facility])    HOSPITAL COURSE:  79 year old female known coronary disease history of coronary bypass surgery history of non-STEMI last cath was 2017 by Dr.Aridapatient has chronic angina on Ranexa and nitrates diabetes hypertension hyperlipidemia seizure disorder.Patient had acute onset of substernal chest discomfort 10 out of 10 she finally called rescue and was brought to the emergency room she taken nitroglycerin sublingual at home without significant improvement.Patient also complained of shortness of breath not able to lie flat. When the patient was brought in by rescue she had ST elevation on EKG no absolute no absolute change from  previous EKG reviewed from 2019. Patient has chronic diffuse changes of ST elevation and ST depression throughout but was different the patient had substernal chest crushing chest pain 10 out of 10 with shortness of breath and dyspnea.  COVID19 pneumonia: completed IV remdesivir. Continue on vit c and zinc. D/c steroids secondary to STEMI & poorly controlled DM2. Encourage incentive spirometry - not been vaccinated   STEMI: s/p cardiac cath but unable to place stents. Continue w/ medical management as per cardio. D/c IV heparin  & continue on aspirin as per cardio. Will continue on ranexa but hold for MAP <65.  - During the course of cardiac cath procedure when access has been obtained the patient had respiratory failure and became apneic never lost a pulse of blood pressure but quit breathing requiring bagging and and subsequent intubation by the ER physician. Admitted to ICU, subsequently extubated and transferred to hospitalist. - Cardiac cath was performed and was not very different from 2017 depressed left ventricular function around 35% mainly inferiorly but globally as well left ventricular enlargement. 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 Total occlusion of vein graft to RCA circumflex system and diagonal.  Hypotension: resolved.   DM2: poorly controlled.   GERD: continue on famotidine   Seizures: continue on keppra, lamictal   Normocytic anemia: Was given 1 unit of packed red blood cell transfusion on the day of discharge for hemoglobin of 7.  There was no obvious bleeding.  Patient follows with Dr. Janese Banks as an outpatient  at cancer center  Generalized weakness: PT/OT recs SNF but Pt has refused both SNF and HH.  Stage III pressure ulcer present on admission Pressure Injury 12/28/19 Buttocks Left Stage 3 -  Full thickness tissue loss. Subcutaneous fat may be visible but bone, tendon or muscle are NOT exposed.  (Active)  12/28/19 1800  Location: Buttocks  Location Orientation: Left  Staging: Stage 3 -  Full thickness tissue loss. Subcutaneous fat may be visible but bone, tendon or muscle are NOT exposed.  Wound Description (Comments):   Present on Admission: Yes   DISCHARGE CONDITIONS:  Fair CONSULTS OBTAINED:   DRUG ALLERGIES:   Allergies  Allergen Reactions  . Talwin [Pentazocine] Anaphylaxis and Swelling    Throat swelling  . Valium [Diazepam] Other (See Comments)    Makes her feel like she's flying  . Vicodin [Hydrocodone-Acetaminophen] Nausea And Vomiting  . Adhesive [Tape] Other (See Comments)    Bruising and TEARING!!!  Please use "paper" tape  . Beta Adrenergic Blockers Other (See Comments)    Pt states she not allergic  . Levofloxacin Swelling and Other (See Comments)    Tongue swells   . Simvastatin Swelling and Rash    Mouth swelling   DISCHARGE MEDICATIONS:   Allergies as of 01/03/2020      Reactions   Talwin [pentazocine] Anaphylaxis, Swelling   Throat swelling   Valium [diazepam] Other (See Comments)   Makes her feel like she's flying   Vicodin [hydrocodone-acetaminophen] Nausea And Vomiting   Adhesive [tape] Other (See Comments)   Bruising and TEARING!!!  Please use "paper" tape   Beta Adrenergic Blockers Other (See Comments)   Pt states she not allergic   Levofloxacin Swelling, Other (See Comments)   Tongue swells   Simvastatin Swelling, Rash   Mouth swelling      Medication List    STOP taking these medications   albuterol 108 (90 Base) MCG/ACT inhaler Commonly known as: VENTOLIN HFA   insulin glargine 100 UNIT/ML Solostar Pen Commonly known as: Lantus SoloStar     TAKE these medications   ascorbic acid 500 MG tablet Commonly known as: VITAMIN C Take 1 tablet (500 mg total) by mouth daily.   aspirin EC 81 MG tablet Take 81 mg by mouth at bedtime.   atorvastatin 40 MG tablet Commonly known as: LIPITOR Take 40 mg by mouth daily.    clopidogrel 75 MG tablet Commonly known as: PLAVIX Take 1 tablet (75 mg total) by mouth every morning. What changed: when to take this   diltiazem 240 MG 24 hr capsule Commonly known as: DILACOR XR Take 240 mg by mouth daily.   gabapentin 100 MG capsule Commonly known as: NEURONTIN Take 100-200 mg by mouth daily as needed.   glipiZIDE 5 MG 24 hr tablet Commonly known as: GLUCOTROL XL Take 5 mg by mouth daily with breakfast.   guaiFENesin-dextromethorphan 100-10 MG/5ML syrup Commonly known as: ROBITUSSIN DM Take 5 mLs every 4 (four) hours as needed by mouth for cough.   isosorbide mononitrate 30 MG 24 hr tablet Commonly known as: IMDUR Take 1 tablet (30 mg total) by mouth daily.   lamoTRIgine 100 MG tablet Commonly known as: LAMICTAL Take 100 mg by mouth daily.   levETIRAcetam 750 MG tablet Commonly known as: KEPPRA Take 750 mg by mouth 2 (two) times daily.   metFORMIN 1000 MG tablet Commonly known as: GLUCOPHAGE Take 1 tablet (1,000 mg total) by mouth 2 (two) times daily.   metoprolol tartrate 25 MG  tablet Commonly known as: LOPRESSOR Take 1 tablet (25 mg total) by mouth at bedtime.   nitroGLYCERIN 0.4 MG SL tablet Commonly known as: NITROSTAT Place 0.4 mg under the tongue every 5 (five) minutes as needed for chest pain.   nystatin cream Commonly known as: MYCOSTATIN Apply 1 application topically 2 (two) times daily as needed (yeast infection).   ramipril 5 MG capsule Commonly known as: ALTACE Take 5 mg by mouth daily.   ranitidine 150 MG tablet Commonly known as: ZANTAC Take 150 mg by mouth every morning.   ranolazine 500 MG 12 hr tablet Commonly known as: Ranexa Take 1 tablet (500 mg total) by mouth 2 (two) times daily.   senna-docusate 8.6-50 MG tablet Commonly known as: Senokot-S Take 1 tablet 2 (two) times daily by mouth.   Simbrinza 1-0.2 % Susp Generic drug: Brinzolamide-Brimonidine Place 1 drop into both eyes 2 (two) times daily.    timolol 0.5 % ophthalmic solution Commonly known as: TIMOPTIC Place 1 drop into both eyes 2 (two) times daily.   triamcinolone ointment 0.1 % Commonly known as: KENALOG Apply 1 application topically 3 (three) times daily as needed (itching from psoriasis).   vitamin B-12 1000 MCG tablet Commonly known as: CYANOCOBALAMIN Take 500 mcg by mouth every morning.   vitamin E 180 MG (400 UNITS) capsule Take 400 Units by mouth every morning.   zinc sulfate 220 (50 Zn) MG capsule Take 1 capsule (220 mg total) by mouth daily.      DISCHARGE INSTRUCTIONS:   DIET:  Cardiac diet DISCHARGE CONDITION:  Stable ACTIVITY:  Activity as tolerated OXYGEN:  Home Oxygen: No.  Oxygen Delivery: room air DISCHARGE LOCATION:  home with palliative care  If you experience worsening of your admission symptoms, develop shortness of breath, life threatening emergency, suicidal or homicidal thoughts you must seek medical attention immediately by calling 911 or calling your MD immediately  if symptoms less severe.  You Must read complete instructions/literature along with all the possible adverse reactions/side effects for all the Medicines you take and that have been prescribed to you. Take any new Medicines after you have completely understood and accpet all the possible adverse reactions/side effects.   Please note  You were cared for by a hospitalist during your hospital stay. If you have any questions about your discharge medications or the care you received while you were in the hospital after you are discharged, you can call the unit and asked to speak with the hospitalist on call if the hospitalist that took care of you is not available. Once you are discharged, your primary care physician will handle any further medical issues. Please note that NO REFILLS for any discharge medications will be authorized once you are discharged, as it is imperative that you return to your primary care physician (or  establish a relationship with a primary care physician if you do not have one) for your aftercare needs so that they can reassess your need for medications and monitor your lab values.    On the day of Discharge:  VITAL SIGNS:  Blood pressure 140/61, pulse 68, temperature (!) 97.5 F (36.4 C), temperature source Oral, resp. rate 17, height 5\' 3"  (1.6 m), weight 57.4 kg, SpO2 100 %. PHYSICAL EXAMINATION:  GENERAL:  79 y.o.-year-old patient lying in the bed with no acute distress.  EYES: Pupils equal, round, reactive to light and accommodation. No scleral icterus. Extraocular muscles intact.  HEENT: Head atraumatic, normocephalic. Oropharynx and nasopharynx clear.  NECK:  Supple, no jugular venous distention. No thyroid enlargement, no tenderness.  LUNGS: Normal breath sounds bilaterally, no wheezing, rales,rhonchi or crepitation. No use of accessory muscles of respiration.  CARDIOVASCULAR: S1, S2 normal. No murmurs, rubs, or gallops.  ABDOMEN: Soft, non-tender, non-distended. Bowel sounds present. No organomegaly or mass.  EXTREMITIES: No pedal edema, cyanosis, or clubbing.  NEUROLOGIC: Cranial nerves II through XII are intact. Muscle strength 5/5 in all extremities. Sensation intact. Gait not checked.  PSYCHIATRIC: The patient is alert and oriented x 3.  SKIN: No obvious rash, lesion, or ulcer.  DATA REVIEW:   CBC Recent Labs  Lab 01/03/20 0525  WBC 7.0  HGB 7.0*  HCT 22.6*  PLT 175    Chemistries  Recent Labs  Lab 12/31/19 0123 12/31/19 0123 01/01/20 0441 01/02/20 0604 01/03/20 0525  NA 137  137   < > 138   < > 135  K 3.7  3.6   < > 3.8   < > 4.8  CL 108  108   < > 111   < > 110  CO2 21*  22   < > 19*   < > 19*  GLUCOSE 207*  183*   < > 89   < > 120*  BUN 40*  40*   < > 33*   < > 32*  CREATININE 0.96  0.97   < > 0.82   < > 0.87  CALCIUM 8.8*  8.8*   < > 8.3*   < > 8.6*  MG 2.1  --   --   --   --   AST 30   < > 22  --   --   ALT 29   < > 19  --   --    ALKPHOS 61   < > 62  --   --   BILITOT 0.5   < > 0.7  --   --    < > = values in this interval not displayed.     Outpatient follow-up Follow-up Information    Marguarite Arbour, MD. Go on 01/05/2020.   Specialty: Internal Medicine Why: Appointment at 2:45pm Contact information: [address] [facility] [place] [address] [phone]        Creig Hines, MD. Go on 01/16/2020.   Specialty: Oncology Why: 10am  Contact information: [address] [place] [address] [phone]        Lamar Blinks, MD. Go on 01/17/2020.   Specialty: Cardiology Why: appointment at 2:30pm Contact information: [address] [facility] [place] [address] [phone]            Management plans discussed with the patient, family and they are in agreement.  CODE STATUS: Prior   TOTAL TIME TAKING CARE OF THIS PATIENT: 45 minutes.    Delfino Lovett M.D on 01/06/2020 at 3:18 PM  [facility]   CC: Primary care physician; Marguarite Arbour, MD   Note: This dictation was prepared with Dragon dictation along with smaller phrase technology. Any transcriptional errors that result from this process are unintentional.

## 2020-01-09 ENCOUNTER — Other Ambulatory Visit: Payer: Medicare Other | Admitting: Adult Health Nurse Practitioner

## 2020-01-09 ENCOUNTER — Other Ambulatory Visit: Payer: Self-pay

## 2020-01-09 DIAGNOSIS — I119 Hypertensive heart disease without heart failure: Secondary | ICD-10-CM

## 2020-01-09 DIAGNOSIS — Z515 Encounter for palliative care: Secondary | ICD-10-CM

## 2020-01-09 NOTE — Progress Notes (Signed)
Therapist, nutritional Palliative Care Consult Note Telephone: [phone]  Fax: [phone]  PATIENT NAME: Leah Small DOB: [birth year] MRN: [id number]  PRIMARY CARE PROVIDER:   Marguarite Arbour, MD  REFERRING PROVIDER:  Marguarite Arbour, MD [address] [facility] [place],  [place] [address]  RESPONSIBLE PARTY:   Self [phone] [place], [place] [phone]  Due to the COVID-19 crisis, this visit was done via telemedicine and it was initiated and consent by this patient and or family. Video-audio (telehealth) contact was unable to be done due to technical barriers from the patient's side.    RECOMMENDATIONS and PLAN:  1.  Advanced care planning.  Patient is a DNR.  Son states that they do have DNR in the home.  Stated that she has a MOST form and living will.  States that she would mostly like to focus on comfort care.  Have asked that they have these documents available at our next in person visit.   2.  Cardiac issues.  Patient has history of CAD, HTN, multiple MI and stent placement h/o TIA. Most recent MI was on 12/28/19 with EF at this time of 15-20%.She is on plavix, aspirin, diltiazem, isosorbide mononitrate, metoprolol tartrate, ramipril, ranolazine.  She has chest pain at rest and with activity.  States that she has a nitro patch and has nitro as needed. States having DOE but this is chronic and stable. Does have to take frequent rest breaks to conserve energy. Denies edema, palpitations, PND, orthopnea.   She is being followed by cardiology.  Continue follow up and recommendations by cardiology.  3.  Anemia.  Patient was hospitalized 5/6-5/12/21 with MI and COVID pneumonia.  She was also found to be anemic with hgb of 7 and required a blood transfusion.  She has been referred to Dr. Smith Robert with oncology for evaluation as no source of bleeding was found. Her SOB and weakness have been worsened by her anemia and she takes frequent rest breaks.   She walks with rollator and does required help with ADLs at times when she is feeling weak.  She does state that she would not chemotherapy if that is what is recommended but she would be willing to have iron infusions if needed.  Will discuss further after her appointment with Dr. Smith Robert on January 23, 2020.    4.  Appetite.  Patient's appetite is decreased.  Son states that she has slowly lost about 30 pounds over the past 2 years.  Her current weight is 126.28 pounds with BMI of 22.4.  She states that she eats 1 meal a day and does supplement with glucerna.  Have suggested trying something like remeron to stimulate her appetite.  She declined adding any medications to help with appetite.   5.  Insomnia.  She states having problems staying asleep at night.  Has not tried anything to help with sleep.  Have suggested trying melatonin but not sure if she will try it if she does not want to add any other medications. Will continue to monitor.  Palliative will continue to monitor for symptom management/decline and make recommendations as needed.  Son to call back to set up next appointment which will be in person.  Encouraged to call with any questions or concerns.  I spent 80 minutes providing this consultation,  from 3:30 to 4:50 including time spent with patient/family, chart review, provider coordination, documentation. More than 50% of the time in this consultation was spent  coordinating communication.   HISTORY OF PRESENT ILLNESS:  Leah Small is a 79 y.o. year old female with multiple medical problems including CAD, HTN, anemia, seizures, DMT2, h/o MI and TIAs. Palliative Care was asked to help address goals of care.   CODE STATUS: DNR  PPS: 40% HOSPICE ELIGIBILITY/DIAGNOSIS: TBD  PHYSICAL EXAM:   Deferred  PAST MEDICAL HISTORY:  Past Medical History:  Diagnosis Date  . Acid reflux   . Arthritis    "all over"  . Chronic stable angina ([facility])   . Coronary artery disease   .  Hypercholesterolemia   . Hypertension   . Myocardial infarction ([facility]) 1991; 07/06/2015  . NSTEMI (non-ST elevated myocardial infarction) ([facility]) 10/22/2015  . Psoriatic arthritis ([facility])   . 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 TIA (transient ischemic attack)    "several over the last 20 years" (10/22/2015)  . Type II diabetes mellitus ([facility])     SOCIAL HX:  Social History   Tobacco Use  . Smoking status: Never Smoker  . Smokeless tobacco: Never Used  Substance Use Topics  . Alcohol use: No    ALLERGIES:  Allergies  Allergen Reactions  . Talwin [Pentazocine] Anaphylaxis and Swelling    Throat swelling  . Valium [Diazepam] Other (See Comments)    Makes her feel like she's flying  . Vicodin [Hydrocodone-Acetaminophen] Nausea And Vomiting  . Adhesive [Tape] Other (See Comments)    Bruising and TEARING!!!  Please use "paper" tape  . Beta Adrenergic Blockers Other (See Comments)    Pt states she not allergic  . Levofloxacin Swelling and Other (See Comments)    Tongue swells   . Simvastatin Swelling and Rash    Mouth swelling     PERTINENT MEDICATIONS:  Outpatient Encounter Medications as of 01/09/2020  Medication Sig  . ascorbic acid (VITAMIN C) 500 MG tablet Take 1 tablet (500 mg total) by mouth daily.  [name] aspirin EC 81 MG tablet Take 81 mg by mouth at bedtime.  [name] atorvastatin (LIPITOR) 40 MG tablet Take 40 mg by mouth daily.  . clopidogrel (PLAVIX) 75 MG tablet Take 1 tablet (75 mg total) by mouth every morning. (Patient taking differently: Take 75 mg by mouth daily. )  . diltiazem (DILACOR XR) 240 MG 24 hr capsule Take 240 mg by mouth daily.  [name] gabapentin (NEURONTIN) 100 MG capsule Take 100-200 mg by mouth daily as needed.  [name] glipiZIDE (GLUCOTROL XL) 5 MG 24 hr tablet Take 5 mg by mouth daily with breakfast.  . guaiFENesin-dextromethorphan (ROBITUSSIN DM) 100-10 MG/5ML syrup Take 5 mLs every 4 (four) hours as needed  by mouth for cough.  . isosorbide mononitrate (IMDUR) 30 MG 24 hr tablet Take 1 tablet (30 mg total) by mouth daily.  [name] lamoTRIgine (LAMICTAL) 100 MG tablet Take 100 mg by mouth daily.  [name] levETIRAcetam (KEPPRA) 750 MG tablet Take 750 mg by mouth 2 (two) times daily.   . metFORMIN (GLUCOPHAGE) 1000 MG tablet Take 1 tablet (1,000 mg total) by mouth 2 (two) times daily.  . metoprolol tartrate (LOPRESSOR) 25 MG tablet Take 1 tablet (25 mg total) by mouth at bedtime.  . nitroGLYCERIN (NITROSTAT) 0.4 MG SL tablet Place 0.4 mg under the tongue every 5 (five) minutes as needed for chest pain.  [name] nystatin cream (MYCOSTATIN) Apply 1 application topically 2 (two) times daily as needed (yeast infection).   . ramipril (ALTACE) 5 MG capsule  Take 5 mg by mouth daily.  . ranitidine (ZANTAC) 150 MG tablet Take 150 mg by mouth every morning.   . ranolazine (RANEXA) 500 MG 12 hr tablet Take 1 tablet (500 mg total) by mouth 2 (two) times daily.  [name] senna-docusate (SENOKOT-S) 8.6-50 MG tablet Take 1 tablet 2 (two) times daily by mouth.  [name] SIMBRINZA 1-0.2 % SUSP Place 1 drop into both eyes 2 (two) times daily.   . timolol (TIMOPTIC) 0.5 % ophthalmic solution Place 1 drop into both eyes 2 (two) times daily.  [name] triamcinolone ointment (KENALOG) 0.1 % Apply 1 application topically 3 (three) times daily as needed (itching from psoriasis).  . vitamin B-12 (CYANOCOBALAMIN) 1000 MCG tablet Take 500 mcg by mouth every morning.   . vitamin E 400 UNIT capsule Take 400 Units by mouth every morning.   . zinc sulfate 220 (50 Zn) MG capsule Take 1 capsule (220 mg total) by mouth daily.   No facility-administered encounter medications on file as of 01/09/2020.      Naiomi Musto Jenetta Downer, NP

## 2020-01-14 ENCOUNTER — Other Ambulatory Visit: Payer: Self-pay | Admitting: *Deleted

## 2020-01-14 DIAGNOSIS — D649 Anemia, unspecified: Secondary | ICD-10-CM

## 2020-01-15 ENCOUNTER — Telehealth: Payer: Self-pay | Admitting: Oncology

## 2020-01-15 NOTE — Telephone Encounter (Signed)
Patient's son phoned on this date and stated that patient was rapid-tested for COVID on 01-14-20. Appt for 01-16-20 was cancelled and rescheduled for 01-23-20.

## 2020-01-16 ENCOUNTER — Inpatient Hospital Stay: Payer: Medicare Other | Admitting: Oncology

## 2020-01-16 ENCOUNTER — Inpatient Hospital Stay: Payer: Medicare Other

## 2020-01-23 ENCOUNTER — Telehealth: Payer: Medicare Other | Admitting: Oncology

## 2020-01-23 ENCOUNTER — Inpatient Hospital Stay: Payer: Medicare Other | Attending: Oncology | Admitting: Oncology

## 2020-01-23 ENCOUNTER — Other Ambulatory Visit: Payer: Self-pay

## 2020-01-23 ENCOUNTER — Inpatient Hospital Stay: Payer: Medicare Other | Attending: Oncology

## 2020-01-23 ENCOUNTER — Encounter: Payer: Self-pay | Admitting: Oncology

## 2020-01-23 VITALS — BP 171/71 | HR 66 | Temp 97.6°F | Resp 18 | Wt 117.8 lb

## 2020-01-23 DIAGNOSIS — E785 Hyperlipidemia, unspecified: Secondary | ICD-10-CM | POA: Diagnosis not present

## 2020-01-23 DIAGNOSIS — Z8616 Personal history of COVID-19: Secondary | ICD-10-CM | POA: Insufficient documentation

## 2020-01-23 DIAGNOSIS — Z888 Allergy status to other drugs, medicaments and biological substances status: Secondary | ICD-10-CM | POA: Diagnosis not present

## 2020-01-23 DIAGNOSIS — D509 Iron deficiency anemia, unspecified: Secondary | ICD-10-CM | POA: Diagnosis present

## 2020-01-23 DIAGNOSIS — D649 Anemia, unspecified: Secondary | ICD-10-CM | POA: Diagnosis not present

## 2020-01-23 DIAGNOSIS — I25810 Atherosclerosis of coronary artery bypass graft(s) without angina pectoris: Secondary | ICD-10-CM | POA: Insufficient documentation

## 2020-01-23 DIAGNOSIS — K219 Gastro-esophageal reflux disease without esophagitis: Secondary | ICD-10-CM | POA: Insufficient documentation

## 2020-01-23 DIAGNOSIS — E119 Type 2 diabetes mellitus without complications: Secondary | ICD-10-CM | POA: Diagnosis not present

## 2020-01-23 DIAGNOSIS — I252 Old myocardial infarction: Secondary | ICD-10-CM | POA: Diagnosis not present

## 2020-01-23 DIAGNOSIS — Z79899 Other long term (current) drug therapy: Secondary | ICD-10-CM | POA: Diagnosis not present

## 2020-01-23 DIAGNOSIS — Z8673 Personal history of transient ischemic attack (TIA), and cerebral infarction without residual deficits: Secondary | ICD-10-CM | POA: Insufficient documentation

## 2020-01-23 DIAGNOSIS — I2510 Atherosclerotic heart disease of native coronary artery without angina pectoris: Secondary | ICD-10-CM | POA: Insufficient documentation

## 2020-01-23 DIAGNOSIS — Z885 Allergy status to narcotic agent status: Secondary | ICD-10-CM | POA: Insufficient documentation

## 2020-01-23 DIAGNOSIS — D638 Anemia in other chronic diseases classified elsewhere: Secondary | ICD-10-CM

## 2020-01-23 DIAGNOSIS — I10 Essential (primary) hypertension: Secondary | ICD-10-CM | POA: Insufficient documentation

## 2020-01-23 LAB — CBC WITH DIFFERENTIAL/PLATELET
Abs Immature Granulocytes: 0.04 10*3/uL (ref 0.00–0.07)
Basophils Absolute: 0 10*3/uL (ref 0.0–0.1)
Basophils Relative: 0 %
Eosinophils Absolute: 0.2 10*3/uL (ref 0.0–0.5)
Eosinophils Relative: 2 %
HCT: 30.4 % — ABNORMAL LOW (ref 36.0–46.0)
Hemoglobin: 9.8 g/dL — ABNORMAL LOW (ref 12.0–15.0)
Immature Granulocytes: 1 %
Lymphocytes Relative: 15 %
Lymphs Abs: 1.3 10*3/uL (ref 0.7–4.0)
MCH: 30.5 pg (ref 26.0–34.0)
MCHC: 32.2 g/dL (ref 30.0–36.0)
MCV: 94.7 fL (ref 80.0–100.0)
Monocytes Absolute: 0.6 10*3/uL (ref 0.1–1.0)
Monocytes Relative: 7 %
Neutro Abs: 6.1 10*3/uL (ref 1.7–7.7)
Neutrophils Relative %: 75 %
Platelets: 251 10*3/uL (ref 150–400)
RBC: 3.21 MIL/uL — ABNORMAL LOW (ref 3.87–5.11)
RDW: 15.8 % — ABNORMAL HIGH (ref 11.5–15.5)
WBC: 8.2 10*3/uL (ref 4.0–10.5)
nRBC: 0 % (ref 0.0–0.2)

## 2020-01-23 NOTE — Progress Notes (Signed)
No concerns at this time. Pt is feeling anxious about this appt.

## 2020-01-25 ENCOUNTER — Other Ambulatory Visit: Payer: Self-pay | Admitting: Oncology

## 2020-01-25 DIAGNOSIS — D509 Iron deficiency anemia, unspecified: Secondary | ICD-10-CM

## 2020-01-25 NOTE — Progress Notes (Signed)
Hematology/Oncology Consult note [facility]  [phone] [phone]  Patient Care Team: Idelle Crouch, MD as PCP - General (Internal Medicine) Sindy Guadeloupe, MD as Consulting Physician (Oncology)   Name of the patient: Leah Small  [id number]  [birth year]   Date of visit: 01/25/20  Diagnosis-anemia multifactorial likely secondary to chronic disease as well as iron deficiency  Chief complaint/ Reason for visit-discuss results of blood work  Heme/Onc history: Patient is a 79 year old female with a past medical history significant for coronary artery disease, CVA, CABG, hypertension hyperlipidemia diabetes GERD among other medical problems.  She was recently seen by cardiology for fatigue and palpitations.  She has been referred to hematology for anemia.  Most recent CBC from 12/04/2019 showed white cell count of 5.7, H&H of 7.5/23.3 and a platelet count of 162.  MCV was 99.  Looking back at her prior CBCs patient has had chronic anemia with a hemoglobin which was around 10 up until January 2021 after which it dropped to 7.8.  CMP showed normal liver and kidney functions.  Hepatitis B and C testing has been negative.  TSH in January was normal.  Results of blood work from 12/18/2019 were as follows: CBC showed white count of 7.4, H&H of 8.5/27.8 and a platelet count of 537.  CMP was significant for mildly elevated creatinine of 1.1.  B12 levels were elevated.  Folate was normal.  TSH was normal.  Haptoglobin was normal.  Myeloma panel showed no M protein.  B1 and B6 levels were normal.Coombs test was negative.  EPO levels were elevated at 54.  LDH was normal.  Iron study showed a low ferritin of 11 with elevated TIBC of 545.  Patient was admitted following to the hospital following that visit and was found to have Covid pneumonia.  She presented with chest pain but during cardiac cath had a complications for which the procedure was aborted and  medical management was recommended.  She was given 1 unit of blood transfusion while she was in the hospital when her Hemoglobin dropped to 7  Interval history-patient is here with her son today.  She is doing well since her hospitalization but still reports significant fatigue  ECOG PS- 2 Pain scale- 0   Review of systems- Review of Systems  Constitutional: Positive for malaise/fatigue. Negative for chills, fever and weight loss.  HENT: Negative for congestion, ear discharge and nosebleeds.   Eyes: Negative for blurred vision.  Respiratory: Negative for cough, hemoptysis, sputum production, shortness of breath and wheezing.   Cardiovascular: Negative for chest pain, palpitations, orthopnea and claudication.  Gastrointestinal: Negative for abdominal pain, blood in stool, constipation, diarrhea, heartburn, melena, nausea and vomiting.  Genitourinary: Negative for dysuria, flank pain, frequency, hematuria and urgency.  Musculoskeletal: Negative for back pain, joint pain and myalgias.  Skin: Negative for rash.  Neurological: Negative for dizziness, tingling, focal weakness, seizures, weakness and headaches.  Endo/Heme/Allergies: Does not bruise/bleed easily.  Psychiatric/Behavioral: Negative for depression and suicidal ideas. The patient does not have insomnia.        Allergies  Allergen Reactions  . Talwin [Pentazocine] Anaphylaxis and Swelling    Throat swelling  . Valium [Diazepam] Other (See Comments)    Makes her feel like she's flying  . Vicodin [Hydrocodone-Acetaminophen] Nausea And Vomiting  . Adhesive [Tape] Other (See Comments)    Bruising and TEARING!!!  Please use "paper" tape  . Beta Adrenergic Blockers Other (See Comments)    Pt  states she not allergic  . Levofloxacin Swelling and Other (See Comments)    Tongue swells   . Simvastatin Swelling and Rash    Mouth swelling     Past Medical History:  Diagnosis Date  . Acid reflux   . Arthritis    "all over"  .  Chronic stable angina ([place])   . Coronary artery disease   . Hypercholesterolemia   . Hypertension   . Myocardial infarction ([place]) 1991; 07/06/2015  . NSTEMI (non-ST elevated myocardial infarction) ([place]) 10/22/2015  . Psoriatic arthritis ([place])   . Seizures ([place])    "complex partial; 1st one was 07/06/2015; might have had one today" (10/22/2015)  . TIA (transient ischemic attack)    "several over the last 20 years" (10/22/2015)  . Type II diabetes mellitus ()      Past Surgical History:  Procedure Laterality Date  . ABDOMINAL HYSTERECTOMY  1973  . APPENDECTOMY  1950s   elementary school  . CARDIAC CATHETERIZATION  X 2-3  . CARDIAC CATHETERIZATION N/A 11/21/2015   Procedure: Left Heart Cath and Coronary Angiography;  Surgeon: Jettie Booze, MD;  Location: [place] [facility];  Service: Cardiovascular;  Laterality: N/A;  . CARDIAC CATHETERIZATION  11/21/2015   Procedure: Coronary Stent Intervention;  Surgeon: Jettie Booze, MD;  Location: [place] [facility];  Service: Cardiovascular;;  . CARDIAC CATHETERIZATION N/A 01/29/2016   Procedure: Left Heart Cath and Cors/Grafts Angiography;  Surgeon: Sherren Mocha, MD;  Location: [place] [facility];  Service: Cardiovascular;  Laterality: N/A;  . CARDIAC CATHETERIZATION N/A 07/22/2016   Procedure: Left Heart Cath and Coronary Angiography;  Surgeon: Wellington Hampshire, MD;  Location: [place] [facility];  Service: Cardiovascular;  Laterality: N/A;  . CAROTID STENT INSERTION Bilateral 2011-2012   right-left  . CATARACT EXTRACTION W/ INTRAOCULAR LENS  IMPLANT, BILATERAL Bilateral 2009-2010  . CHOLECYSTECTOMY OPEN  ~ 2014  . CORONARY ANGIOPLASTY    . CORONARY ANGIOPLASTY WITH STENT PLACEMENT  11/21/2015  . CORONARY ARTERY BYPASS GRAFT  08/1995  . CORONARY/GRAFT ACUTE MI REVASCULARIZATION N/A 12/28/2019   Procedure: Coronary/Graft Acute MI Revascularization;  Surgeon: Yolonda Kida, MD;  Location: [place] [facility];  Service:  Cardiovascular;  Laterality: N/A;  . DILATION AND CURETTAGE OF UTERUS  X 1  . LEFT HEART CATH AND CORONARY ANGIOGRAPHY N/A 12/28/2019   Procedure: LEFT HEART CATH AND CORONARY ANGIOGRAPHY;  Surgeon: Yolonda Kida, MD;  Location: [place] [facility];  Service: Cardiovascular;  Laterality: N/A;  . 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 TONSILLECTOMY  ~ 1947   2nd grade    Social History   Socioeconomic History  . Marital status: Widowed    Spouse name: Not on file  . Number of children: Not on file  . Years of education: Not on file  . Highest education level: Not on file  Occupational History  . Not on file  Tobacco Use  . Smoking status: Never Smoker  . Smokeless tobacco: Never Used  Substance and Sexual Activity  . Alcohol use: No  . Drug use: No  . Sexual activity: Never  Other Topics Concern  . Not on file  Social History Narrative   Lives at home with one of her sons. Independent at baseline.   Social Determinants of Health   Financial Resource Strain:   . Difficulty of Paying Living Expenses:   Food Insecurity:   . Worried About Estate manager/land agent  of Food in the Last Year:   . [place] in the Last Year:   Transportation Needs:   . Lack of Transportation (Medical):   [name] Lack of Transportation (Non-Medical):   Physical Activity:   . Days of Exercise per Week:   . Minutes of Exercise per Session:   Stress:   . Feeling of Stress :   Social Connections:   . Frequency of Communication with Friends and Family:   . Frequency of Social Gatherings with Friends and Family:   . Attends Religious Services:   . Active Member of Clubs or Organizations:   . Attends Archivist Meetings:   [name] Marital Status:   Intimate Partner Violence:   . Fear of Current or Ex-Partner:   . Emotionally Abused:   [name] Physically Abused:   . Sexually Abused:     Family History  Problem Relation Age of Onset  . Heart attack  Father   . Diabetes Mother   . Cancer Mother   . Stroke Mother   . Breast cancer Cousin   . Breast cancer Cousin   . Breast cancer Cousin      Current Outpatient Medications:  .  ascorbic acid (VITAMIN C) 500 MG tablet, Take 1 tablet (500 mg total) by mouth daily., Disp: 30 tablet, Rfl: 0 .  aspirin EC 81 MG tablet, Take 81 mg by mouth at bedtime., Disp: , Rfl:  .  atorvastatin (LIPITOR) 40 MG tablet, Take 40 mg by mouth daily., Disp: , Rfl:  .  clopidogrel (PLAVIX) 75 MG tablet, Take 1 tablet (75 mg total) by mouth every morning. (Patient taking differently: Take 75 mg by mouth daily. ), Disp: 30 tablet, Rfl: 2 .  diltiazem (DILACOR XR) 240 MG 24 hr capsule, Take 240 mg by mouth daily., Disp: , Rfl:  .  glipiZIDE (GLUCOTROL XL) 5 MG 24 hr tablet, Take 5 mg by mouth daily with breakfast., Disp: , Rfl:  .  guaiFENesin-dextromethorphan (ROBITUSSIN DM) 100-10 MG/5ML syrup, Take 5 mLs every 4 (four) hours as needed by mouth for cough., Disp: 118 mL, Rfl: 0 .  lamoTRIgine (LAMICTAL) 100 MG tablet, Take 100 mg by mouth daily., Disp: , Rfl:  .  levETIRAcetam (KEPPRA) 750 MG tablet, Take 750 mg by mouth 2 (two) times daily. , Disp: , Rfl:  .  metFORMIN (GLUCOPHAGE) 1000 MG tablet, Take 1 tablet (1,000 mg total) by mouth 2 (two) times daily., Disp: , Rfl:  .  nitroGLYCERIN (NITROSTAT) 0.4 MG SL tablet, Place 0.4 mg under the tongue every 5 (five) minutes as needed for chest pain., Disp: , Rfl:  .  nystatin cream (MYCOSTATIN), Apply 1 application topically 2 (two) times daily as needed (yeast infection). , Disp: , Rfl:  .  ramipril (ALTACE) 5 MG capsule, Take 5 mg by mouth daily., Disp: , Rfl:  .  SIMBRINZA 1-0.2 % SUSP, Place 1 drop into both eyes 2 (two) times daily. , Disp: , Rfl:  .  timolol (TIMOPTIC) 0.5 % ophthalmic solution, Place 1 drop into both eyes 2 (two) times daily., Disp: , Rfl:  .  triamcinolone ointment (KENALOG) 0.1 %, Apply 1 application topically 3 (three) times daily as needed  (itching from psoriasis)., Disp: , Rfl:  .  vitamin B-12 (CYANOCOBALAMIN) 1000 MCG tablet, Take 500 mcg by mouth every morning. , Disp: , Rfl:  .  vitamin E 400 UNIT capsule, Take 400 Units by mouth every morning. , Disp: , Rfl:  .  zinc sulfate 220 (50 Zn) MG capsule, Take 1 capsule (220 mg total) by mouth daily., Disp: 30 capsule, Rfl: 0 .  gabapentin (NEURONTIN) 100 MG capsule, Take 100-200 mg by mouth daily as needed., Disp: , Rfl:  .  isosorbide mononitrate (IMDUR) 30 MG 24 hr tablet, Take 1 tablet (30 mg total) by mouth daily. (Patient not taking: Reported on 01/23/2020), Disp: 30 tablet, Rfl: 2 .  metoprolol tartrate (LOPRESSOR) 25 MG tablet, Take 1 tablet (25 mg total) by mouth at bedtime. (Patient not taking: Reported on 01/23/2020), Disp: 30 tablet, Rfl: 12 .  ranitidine (ZANTAC) 150 MG tablet, Take 150 mg by mouth every morning. , Disp: , Rfl:  .  ranolazine (RANEXA) 500 MG 12 hr tablet, Take 1 tablet (500 mg total) by mouth 2 (two) times daily., Disp: 60 tablet, Rfl: 1 .  senna-docusate (SENOKOT-S) 8.6-50 MG tablet, Take 1 tablet 2 (two) times daily by mouth. (Patient not taking: Reported on 01/23/2020), Disp: 1 tablet, Rfl:   Physical exam:  Vitals:   01/23/20 1115  BP: (!) 171/71  Pulse: 66  Resp: 18  Temp: 97.6 F (36.4 C)  TempSrc: Oral  SpO2: 100%  Weight: 117 lb 12.8 oz (53.4 kg)   Physical Exam Constitutional:      General: She is not in acute distress. Cardiovascular:     Rate and Rhythm: Normal rate and regular rhythm.     Heart sounds: Normal heart sounds.  Pulmonary:     Effort: Pulmonary effort is normal.     Breath sounds: Normal breath sounds.  Abdominal:     General: Bowel sounds are normal.     Palpations: Abdomen is soft.  Skin:    General: Skin is warm and dry.  Neurological:     Mental Status: She is alert and oriented to person, place, and time.      CMP Latest Ref Rng & Units 01/03/2020  Glucose 70 - 99 mg/dL 120(H)  BUN 8 - 23 mg/dL 32(H)    Creatinine 0.44 - 1.00 mg/dL 0.87  Sodium 135 - 145 mmol/L 135  Potassium 3.5 - 5.1 mmol/L 4.8  Chloride 98 - 111 mmol/L 110  CO2 22 - 32 mmol/L 19(L)  Calcium 8.9 - 10.3 mg/dL 8.6(L)  Total Protein 6.5 - 8.1 g/dL -  Total Bilirubin 0.3 - 1.2 mg/dL -  Alkaline Phos 38 - 126 U/L -  AST 15 - 41 U/L -  ALT 0 - 44 U/L -   CBC Latest Ref Rng & Units 01/23/2020  WBC 4.0 - 10.5 K/uL 8.2  Hemoglobin 12.0 - 15.0 g/dL 9.8(L)  Hematocrit 36.0 - 46.0 % 30.4(L)  Platelets 150 - 400 K/uL 251    No images are attached to the encounter.  DG Abd 1 View  Result Date: 12/28/2019 CLINICAL DATA:  Tube placement EXAM: ABDOMEN - 1 VIEW COMPARISON:  None. FINDINGS: The enteric tube projects over the gastric body. There is a large amount of stool in the partially visualized colon. The visualized bowel gas pattern is unremarkable. IMPRESSION: Enteric tube projects over the gastric body. Large stool burden in the partially visualized colon. Electronically Signed   By: Constance Holster M.D.   On: 12/28/2019 18:50   CARDIAC CATHETERIZATION  Result Date: 12/28/2019  Prox LAD lesion is 100% stenosed.  Dist LAD lesion is 30% stenosed.  Non-stenotic Ost Ramus to Ramus lesion was previously treated.  Ramus lesion is 30% stenosed.  Mid Cx lesion is 100% stenosed.  Prox Cx  lesion is 90% stenosed.  Prox RCA to Mid RCA lesion is 90% stenosed.  Mid RCA lesion is 100% stenosed.  LIMA. No significant disease with insertion into the mid LAD  Prox Graft lesion is 100% stenosed.  Prox Graft lesion is 100% stenosed.  Prox Graft lesion is 100% stenosed.  Conclusion STEMI presentation with cardiac cath Left heart cath with grafts intervention was deferred Multivessel native coronary disease moderate disease in left main totally occluded ostial LAD proximal circumflex ostial right Totally occluded SVG to RCA circumflex and diagonal LIMA to mid LAD was normal Left ventriculogram showed moderately depressed left ventricular  function around 35% globally Unsuccessful minx FemoStop was placed Patient was intubated sedated Case was then transferred to ICU critical care team for management Conservative cardiac input at this point   DG Chest Port 1 View  Result Date: 12/29/2019 CLINICAL DATA:  Acute respiratory failure EXAM: PORTABLE CHEST 1 VIEW COMPARISON:  Radiograph 12/28/2019 FINDINGS: Low positioning of the endotracheal tube which approximates the orifice of the right mainstem bronchus, recommend retraction at least 3 cm to the mid trachea. Transesophageal tube tip below the GE junction, beyond the level of imaging. Telemetry leads overlie the chest. Mild hyperexpansion of the lungs with some central vascular congestion. Increasing prominence of the interstitium with some more hazy opacity in the right lung base. No visible pneumothorax or effusion. 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 No acute osseous or soft tissue abnormality. Degenerative changes are present in the imaged spine and shoulders. IMPRESSION: 1. Low positioning of the endotracheal tube which approximates the orifice of the right mainstem bronchus. Recommend retraction at least 3 cm. 2. Satisfactory positioning of the transesophageal tube. 3. Increasing prominence of the interstitium with some more hazy opacity in the right lung base, could reflect developing edema or infection. These results will be called to the ordering clinician or representative by the Radiologist Assistant, and communication documented in the PACS or [employer]. Electronically Signed   By: Lovena Le M.D.   On: 12/29/2019 05:50   DG Chest Port 1 View  Result Date: 12/28/2019 CLINICAL DATA:  Dyspnea. EXAM: PORTABLE CHEST 1 VIEW COMPARISON:  12/17/2017 FINDINGS: The endotracheal tube terminates above the carina by approximately 2.6 cm. The enteric tube extends below the left hemidiaphragm. The heart size is mildly enlarged. The patient is  status post prior median sternotomy. There is prominence of the pulmonary vasculature. There are atherosclerotic changes of the thoracic aorta. The lungs appear slightly hyperexpanded. There are prominent interstitial lung markings. There is no definite acute osseous abnormality. IMPRESSION: 1. Lines and tubes as above. 2. Findings suggestive of developing interstitial edema. 3.  Aortic Atherosclerosis (ICD10-I70.0). Electronically Signed   By: Constance Holster M.D.   On: 12/28/2019 18:50   ECHOCARDIOGRAM COMPLETE  Result Date: 12/29/2019    ECHOCARDIOGRAM REPORT   Patient Name:   JACYNDA BRUNKE Date of Exam: 12/29/2019 Medical Rec #:  [id number]       Height:       68.0 in Accession #:    [id number]      Weight:       122.0 lb Date of Birth:  08-27-1940       BSA:          1.657 m Patient Age:    20 years        BP:           111/61 mmHg Patient Gender: F  HR:           74 bpm. Exam Location:  [facility] Procedure: 2D Echo, Color Doppler and Cardiac Doppler Indications:     I21.3 STEMI  History:         Patient has prior history of Echocardiogram examinations. CAD,                  TIA; Risk Factors:Diabetes and HCL.  Sonographer:     Charmayne Sheer RDCS (AE) Referring Phys:  [id number] [facility] Diagnosing Phys: Serafina Royals MD  Sonographer Comments: Suboptimal apical window, suboptimal subcostal window and echo performed with patient supine and on artificial respirator. IMPRESSIONS  1. Left ventricular ejection fraction, by estimation, is <20%. The left ventricle has severely decreased function. The left ventricle demonstrates global hypokinesis. The left ventricular internal cavity size was mildly to moderately dilated. Left ventricular diastolic parameters are consistent with Grade I diastolic dysfunction (impaired relaxation).  2. Right ventricular systolic function is normal. The right ventricular size is normal. There is normal pulmonary artery systolic pressure.  3. Left atrial size was mildly  dilated.  4. Right atrial size was mildly dilated.  5. The mitral valve is normal in structure. Mild to moderate mitral valve regurgitation.  6. The aortic valve is normal in structure. Aortic valve regurgitation is trivial. FINDINGS  Left Ventricle: Left ventricular ejection fraction, by estimation, is <20%. The left ventricle has severely decreased function. The left ventricle demonstrates global hypokinesis. The left ventricular internal cavity size was mildly to moderately dilated. There is borderline left ventricular hypertrophy. Left ventricular diastolic parameters are consistent with Grade I diastolic dysfunction (impaired relaxation). Right Ventricle: The right ventricular size is normal. No increase in right ventricular wall thickness. Right ventricular systolic function is normal. There is normal pulmonary artery systolic pressure. The tricuspid regurgitant velocity is 2.19 m/s, and  with an assumed right atrial pressure of 10 mmHg, the estimated right ventricular systolic pressure is 65.7 mmHg. Left Atrium: Left atrial size was mildly dilated. Right Atrium: Right atrial size was mildly dilated. Pericardium: There is no evidence of pericardial effusion. Mitral Valve: The mitral valve is normal in structure. Mild to moderate mitral valve regurgitation. MV peak gradient, 2.4 mmHg. The mean mitral valve gradient is 1.0 mmHg. Tricuspid Valve: The tricuspid valve is normal in structure. Tricuspid valve regurgitation is mild. Aortic Valve: The aortic valve is normal in structure. Aortic valve regurgitation is trivial. Aortic valve mean gradient measures 1.0 mmHg. Aortic valve peak gradient measures 2.5 mmHg. Aortic valve area, by VTI measures 1.92 cm. Pulmonic Valve: The pulmonic valve was normal in structure. Pulmonic valve regurgitation is not visualized. Aorta: The aortic root and ascending aorta are structurally normal, with no evidence of dilitation. IAS/Shunts: No atrial level shunt detected by color  flow Doppler.  LEFT VENTRICLE PLAX 2D LVIDd:         4.98 cm  Diastology LVIDs:         4.43 cm  LV e' lateral:   5.11 cm/s LV PW:         0.83 cm  LV E/e' lateral: 11.4 LV IVS:        0.79 cm  LV e' medial:    3.05 cm/s LVOT diam:     1.60 cm  LV E/e' medial:  19.1 LV SV:         20 LV SV Index:   12 LVOT Area:     2.01 cm  LEFT ATRIUM  Index LA diam:    3.30 cm 1.99 cm/m  AORTIC VALVE                   PULMONIC VALVE AV Area (Vmax):    1.67 cm    PV Vmax:       0.69 m/s AV Area (Vmean):   1.87 cm    PV Vmean:      47.600 cm/s AV Area (VTI):     1.92 cm    PV VTI:        0.101 m AV Vmax:           79.20 cm/s  PV Peak grad:  1.9 mmHg AV Vmean:          47.000 cm/s PV Mean grad:  1.0 mmHg AV VTI:            0.106 m AV Peak Grad:      2.5 mmHg AV Mean Grad:      1.0 mmHg LVOT Vmax:         65.80 cm/s LVOT Vmean:        43.700 cm/s LVOT VTI:          0.101 m LVOT/AV VTI ratio: 0.95  AORTA Ao Root diam: 2.40 cm MITRAL VALVE               TRICUSPID VALVE MV Area (PHT): 3.91 cm    TR Peak grad:   19.2 mmHg MV Peak grad:  2.4 mmHg    TR Vmax:        219.00 cm/s MV Mean grad:  1.0 mmHg MV Vmax:       0.78 m/s    SHUNTS MV Vmean:      48.7 cm/s   Systemic VTI:  0.10 m MV Decel Time: 194 msec    Systemic Diam: 1.60 cm MV E velocity: 58.20 cm/s MV A velocity: 34.90 cm/s MV E/A ratio:  1.67 Serafina Royals MD Electronically signed by Serafina Royals MD Signature Date/Time: 12/29/2019/3:56:35 PM    Final      Assessment and plan- Patient is a 79 y.o. female referred for anemia likely multifactorial secondary to chronic disease as well as component of iron deficiency  I discussed the results of anemia work-up with the patient and her son in detail.  Based on her work-up she has evidence of iron deficiency but no other reversible cause.  Following my initial visit patient was admitted to the hospital for Covid pneumonia as well as chest pain and was recently discharged.  She received a unit of blood transfusion in  the hospital.  Today her hemoglobin is better at 9.8.  I do think that she would benefit from IV iron given her evidence of iron deficiency and ongoing fatigue.  I recommend 2 doses of Feraheme 510 mg weekly x2 doses.  Discussed risks and benefits of Feraheme including all but not limited to nausea, headache, leg swelling and possible risk of infusion reaction.  Patient understands and agrees to proceed as planned.  I will plan to repeat CBC ferritin and iron studies in 8 weeks time and see her thereafter.  If her hemoglobin consistently remains less than 9 in the future I will consider doing a bone marrow biopsy at that time.  I suspect her anemia is multifactorial secondary to chronic disease and iron deficiency and unlikely to completely normalize despite IV iron infusion   Visit Diagnosis 1. Iron deficiency anemia, unspecified iron deficiency anemia type  2. Anemia of chronic disease      Dr. Randa Evens, MD, MPH [facility] [phone] 01/25/2020 8:30 AM

## 2020-01-26 ENCOUNTER — Other Ambulatory Visit: Payer: Self-pay

## 2020-01-26 ENCOUNTER — Inpatient Hospital Stay: Payer: Medicare Other

## 2020-01-26 VITALS — BP 174/67 | HR 75 | Temp 96.0°F | Resp 18

## 2020-01-26 DIAGNOSIS — D509 Iron deficiency anemia, unspecified: Secondary | ICD-10-CM | POA: Diagnosis not present

## 2020-01-26 MED ORDER — SODIUM CHLORIDE 0.9 % IV SOLN
510.0000 mg | Freq: Once | INTRAVENOUS | Status: AC
  Administered 2020-01-26: 510 mg via INTRAVENOUS
  Filled 2020-01-26: qty 17

## 2020-01-26 MED ORDER — SODIUM CHLORIDE 0.9 % IV SOLN
Freq: Once | INTRAVENOUS | Status: AC
  Filled 2020-01-26: qty 250

## 2020-01-26 NOTE — Progress Notes (Signed)
1400: B/P 165/88, Per Dr. Smith Robert okay to proceed with [facility] today.   1533: Pt tolerated infusion well. No s/s of distress or reaction noted. VS remain at baseline. Pt stable at discharge.

## 2020-02-02 ENCOUNTER — Other Ambulatory Visit: Payer: Self-pay

## 2020-02-02 ENCOUNTER — Inpatient Hospital Stay: Payer: Medicare Other

## 2020-02-02 VITALS — BP 176/77 | HR 81 | Temp 96.0°F | Resp 18

## 2020-02-02 DIAGNOSIS — D509 Iron deficiency anemia, unspecified: Secondary | ICD-10-CM

## 2020-02-02 MED ORDER — SODIUM CHLORIDE 0.9 % IV SOLN
Freq: Once | INTRAVENOUS | Status: AC
  Filled 2020-02-02: qty 250

## 2020-02-02 MED ORDER — SODIUM CHLORIDE 0.9 % IV SOLN
510.0000 mg | Freq: Once | INTRAVENOUS | Status: AC
  Administered 2020-02-02: 510 mg via INTRAVENOUS
  Filled 2020-02-02: qty 510

## 2020-02-02 NOTE — Progress Notes (Signed)
1412: 174/78. AT this time pt states that when her b/p was 190/78 that her left arm was hurting ( pt reports this is WNL for when her b/p increases). Pt states that pain has resolved and NO symptoms present at this time. Dr. Smith Robert aware, and per Dr. Smith Robert okay to continue with feraheme.  1547: Pt tolerated infusion well. Pt denies any complaints, no s/s of distress or reaction noted. Pt stable and VS at baseline at discharge.

## 2020-03-19 ENCOUNTER — Other Ambulatory Visit: Payer: Medicare Other

## 2020-03-19 ENCOUNTER — Ambulatory Visit: Payer: Medicare Other | Admitting: Oncology

## 2020-04-04 ENCOUNTER — Ambulatory Visit: Payer: Medicare Other | Admitting: Oncology

## 2020-04-04 ENCOUNTER — Inpatient Hospital Stay: Payer: Medicare Other | Attending: Oncology

## 2020-04-04 ENCOUNTER — Inpatient Hospital Stay: Payer: Medicare Other | Admitting: Oncology

## 2020-04-04 ENCOUNTER — Encounter: Payer: Self-pay | Admitting: Oncology

## 2020-04-04 ENCOUNTER — Other Ambulatory Visit: Payer: Self-pay

## 2020-04-04 ENCOUNTER — Other Ambulatory Visit: Payer: Medicare Other

## 2020-04-04 VITALS — BP 157/56 | HR 70 | Temp 96.0°F | Resp 16 | Wt 115.9 lb

## 2020-04-04 DIAGNOSIS — E119 Type 2 diabetes mellitus without complications: Secondary | ICD-10-CM | POA: Insufficient documentation

## 2020-04-04 DIAGNOSIS — Z8249 Family history of ischemic heart disease and other diseases of the circulatory system: Secondary | ICD-10-CM | POA: Diagnosis not present

## 2020-04-04 DIAGNOSIS — K219 Gastro-esophageal reflux disease without esophagitis: Secondary | ICD-10-CM | POA: Diagnosis not present

## 2020-04-04 DIAGNOSIS — R5383 Other fatigue: Secondary | ICD-10-CM | POA: Insufficient documentation

## 2020-04-04 DIAGNOSIS — E785 Hyperlipidemia, unspecified: Secondary | ICD-10-CM | POA: Insufficient documentation

## 2020-04-04 DIAGNOSIS — D638 Anemia in other chronic diseases classified elsewhere: Secondary | ICD-10-CM

## 2020-04-04 DIAGNOSIS — Z79899 Other long term (current) drug therapy: Secondary | ICD-10-CM | POA: Diagnosis not present

## 2020-04-04 DIAGNOSIS — D696 Thrombocytopenia, unspecified: Secondary | ICD-10-CM | POA: Insufficient documentation

## 2020-04-04 DIAGNOSIS — I2510 Atherosclerotic heart disease of native coronary artery without angina pectoris: Secondary | ICD-10-CM | POA: Diagnosis not present

## 2020-04-04 DIAGNOSIS — Z9049 Acquired absence of other specified parts of digestive tract: Secondary | ICD-10-CM | POA: Insufficient documentation

## 2020-04-04 DIAGNOSIS — D72819 Decreased white blood cell count, unspecified: Secondary | ICD-10-CM | POA: Insufficient documentation

## 2020-04-04 DIAGNOSIS — Z881 Allergy status to other antibiotic agents status: Secondary | ICD-10-CM | POA: Insufficient documentation

## 2020-04-04 DIAGNOSIS — Z803 Family history of malignant neoplasm of breast: Secondary | ICD-10-CM | POA: Insufficient documentation

## 2020-04-04 DIAGNOSIS — Z823 Family history of stroke: Secondary | ICD-10-CM | POA: Insufficient documentation

## 2020-04-04 DIAGNOSIS — M1990 Unspecified osteoarthritis, unspecified site: Secondary | ICD-10-CM | POA: Insufficient documentation

## 2020-04-04 DIAGNOSIS — Z888 Allergy status to other drugs, medicaments and biological substances status: Secondary | ICD-10-CM | POA: Insufficient documentation

## 2020-04-04 DIAGNOSIS — Z8673 Personal history of transient ischemic attack (TIA), and cerebral infarction without residual deficits: Secondary | ICD-10-CM | POA: Insufficient documentation

## 2020-04-04 DIAGNOSIS — D509 Iron deficiency anemia, unspecified: Secondary | ICD-10-CM | POA: Diagnosis not present

## 2020-04-04 DIAGNOSIS — Z809 Family history of malignant neoplasm, unspecified: Secondary | ICD-10-CM | POA: Diagnosis not present

## 2020-04-04 DIAGNOSIS — Z833 Family history of diabetes mellitus: Secondary | ICD-10-CM | POA: Diagnosis not present

## 2020-04-04 DIAGNOSIS — I10 Essential (primary) hypertension: Secondary | ICD-10-CM | POA: Diagnosis not present

## 2020-04-04 DIAGNOSIS — I252 Old myocardial infarction: Secondary | ICD-10-CM | POA: Insufficient documentation

## 2020-04-04 DIAGNOSIS — Z885 Allergy status to narcotic agent status: Secondary | ICD-10-CM | POA: Diagnosis not present

## 2020-04-04 LAB — IRON AND TIBC
Iron: 177 ug/dL — ABNORMAL HIGH (ref 28–170)
Saturation Ratios: 48 % — ABNORMAL HIGH (ref 10.4–31.8)
TIBC: 368 ug/dL (ref 250–450)
UIBC: 191 ug/dL

## 2020-04-04 LAB — CBC WITH DIFFERENTIAL/PLATELET
Abs Immature Granulocytes: 0.02 10*3/uL (ref 0.00–0.07)
Basophils Absolute: 0 10*3/uL (ref 0.0–0.1)
Basophils Relative: 1 %
Eosinophils Absolute: 0 10*3/uL (ref 0.0–0.5)
Eosinophils Relative: 1 %
HCT: 26 % — ABNORMAL LOW (ref 36.0–46.0)
Hemoglobin: 9.1 g/dL — ABNORMAL LOW (ref 12.0–15.0)
Immature Granulocytes: 1 %
Lymphocytes Relative: 26 %
Lymphs Abs: 0.9 10*3/uL (ref 0.7–4.0)
MCH: 33.7 pg (ref 26.0–34.0)
MCHC: 35 g/dL (ref 30.0–36.0)
MCV: 96.3 fL (ref 80.0–100.0)
Monocytes Absolute: 0.2 10*3/uL (ref 0.1–1.0)
Monocytes Relative: 6 %
Neutro Abs: 2.3 10*3/uL (ref 1.7–7.7)
Neutrophils Relative %: 65 %
Platelets: 115 10*3/uL — ABNORMAL LOW (ref 150–400)
RBC: 2.7 MIL/uL — ABNORMAL LOW (ref 3.87–5.11)
RDW: 18.9 % — ABNORMAL HIGH (ref 11.5–15.5)
WBC: 3.5 10*3/uL — ABNORMAL LOW (ref 4.0–10.5)
nRBC: 0.8 % — ABNORMAL HIGH (ref 0.0–0.2)

## 2020-04-04 LAB — FERRITIN: Ferritin: 279 ng/mL (ref 11–307)

## 2020-04-04 NOTE — Progress Notes (Signed)
Hematology/Oncology Consult note [facility]  Telephone:([phone] [phone]  Patient Care Team: Idelle Crouch, MD as PCP - General (Internal Medicine) Sindy Guadeloupe, MD as Consulting Physician (Oncology)   Name of the patient: Leah Small  [id number]  1941-06-25   Date of visit: 04/04/20  Diagnosis-anemia multifactorial likely secondary to chronic disease as well as iron deficiency   Chief complaint/ Reason for visit-routine follow-up of anemia  Heme/Onc history: Patient is a 79 year old female with a past medical history significant for coronary artery disease, CVA, CABG, hypertension hyperlipidemia diabetes GERD among other medical problems. She was recently seen by cardiology for fatigue and palpitations. She has been referred to hematology for anemia. Most recent CBC from 12/04/2019 showed white cell count of 5.7, H&H of 7.5/23.3 and a platelet count of 162. MCV was 99. Looking back at her prior CBCs patient has had chronic anemia with a hemoglobin which was around 10 up until January 2021 after which it dropped to 7.8. CMP showed normal liver and kidney functions. Hepatitis B and C testing has been negative. TSH in January was normal.  Results of blood work from 12/18/2019 were as follows: CBC showed white count of 7.4, H&H of 8.5/27.8 and a platelet count of 537.  CMP was significant for mildly elevated creatinine of 1.1.  B12 levels were elevated.  Folate was normal.  TSH was normal.  Haptoglobin was normal.  Myeloma panel showed no M protein.  B1 and B6 levels were normal.Coombs test was negative.  EPO levels were elevated at 54.  LDH was normal.  Iron study showed a low ferritin of 11 with elevated TIBC of 545.  Patient was admitted following to the hospital following that visit and was found to have Covid pneumonia.  She presented with chest pain but during cardiac cath had a complications for which the procedure was aborted and  medical management was recommended.  She was given 1 unit of blood transfusion while she was in the hospital when her Hemoglobin dropped to 7. She also received IV iron.    Interval history-reports ongoing fatigue denies other complaints  ECOG PS- 2 Pain scale- 0   Review of systems- Review of Systems  Constitutional: Positive for malaise/fatigue. Negative for chills, fever and weight loss.  HENT: Negative for congestion, ear discharge and nosebleeds.   Eyes: Negative for blurred vision.  Respiratory: Negative for cough, hemoptysis, sputum production, shortness of breath and wheezing.   Cardiovascular: Negative for chest pain, palpitations, orthopnea and claudication.  Gastrointestinal: Negative for abdominal pain, blood in stool, constipation, diarrhea, heartburn, melena, nausea and vomiting.  Genitourinary: Negative for dysuria, flank pain, frequency, hematuria and urgency.  Musculoskeletal: Negative for back pain, joint pain and myalgias.  Skin: Negative for rash.  Neurological: Negative for dizziness, tingling, focal weakness, seizures, weakness and headaches.  Endo/Heme/Allergies: Does not bruise/bleed easily.  Psychiatric/Behavioral: Negative for depression and suicidal ideas. The patient does not have insomnia.       Allergies  Allergen Reactions  . Talwin [Pentazocine] Anaphylaxis and Swelling    Throat swelling  . Valium [Diazepam] Other (See Comments)    Makes her feel like she's flying  . Vicodin [Hydrocodone-Acetaminophen] Nausea And Vomiting  . Adhesive [Tape] Other (See Comments)    Bruising and TEARING!!!  Please use "paper" tape  . Beta Adrenergic Blockers Other (See Comments)    Pt states she not allergic  . Levofloxacin Swelling and Other (See Comments)    Tongue  swells   . Simvastatin Swelling and Rash    Mouth swelling     Past Medical History:  Diagnosis Date  . Acid reflux   . Arthritis    "all over"  . Chronic stable angina ([place])   . Coronary  artery disease   . Hypercholesterolemia   . Hypertension   . Myocardial infarction ([place]) 1991; 07/06/2015  . NSTEMI (non-ST elevated myocardial infarction) ([place]) 10/22/2015  . Psoriatic arthritis ([place])   . Seizures ([place])    "complex partial; 1st one was 07/06/2015; might have had one today" (10/22/2015)  . TIA (transient ischemic attack)    "several over the last 20 years" (10/22/2015)  . Type II diabetes mellitus ([place])      Past Surgical History:  Procedure Laterality Date  . ABDOMINAL HYSTERECTOMY  1973  . APPENDECTOMY  1950s   elementary school  . CARDIAC CATHETERIZATION  X 2-3  . CARDIAC CATHETERIZATION N/A 11/21/2015   Procedure: Left Heart Cath and Coronary Angiography;  Surgeon: Jettie Booze, MD;  Location: [place] [facility];  Service: Cardiovascular;  Laterality: N/A;  . CARDIAC CATHETERIZATION  11/21/2015   Procedure: Coronary Stent Intervention;  Surgeon: Jettie Booze, MD;  Location: [place] [facility];  Service: Cardiovascular;;  . CARDIAC CATHETERIZATION N/A 01/29/2016   Procedure: Left Heart Cath and Cors/Grafts Angiography;  Surgeon: Sherren Mocha, MD;  Location: [place] [facility];  Service: Cardiovascular;  Laterality: N/A;  . CARDIAC CATHETERIZATION N/A 07/22/2016   Procedure: Left Heart Cath and Coronary Angiography;  Surgeon: Wellington Hampshire, MD;  Location: [place] [facility];  Service: Cardiovascular;  Laterality: N/A;  . CAROTID STENT INSERTION Bilateral 2011-2012   right-left  . CATARACT EXTRACTION W/ INTRAOCULAR LENS  IMPLANT, BILATERAL Bilateral 2009-2010  . CHOLECYSTECTOMY OPEN  ~ 2014  . CORONARY ANGIOPLASTY    . CORONARY ANGIOPLASTY WITH STENT PLACEMENT  11/21/2015  . CORONARY ARTERY BYPASS GRAFT  08/1995  . CORONARY/GRAFT ACUTE MI REVASCULARIZATION N/A 12/28/2019   Procedure: Coronary/Graft Acute MI Revascularization;  Surgeon: Yolonda Kida, MD;  Location: [place] [facility];  Service: Cardiovascular;  Laterality: N/A;  . DILATION AND  CURETTAGE OF UTERUS  X 1  . LEFT HEART CATH AND CORONARY ANGIOGRAPHY N/A 12/28/2019   Procedure: LEFT HEART CATH AND CORONARY ANGIOGRAPHY;  Surgeon: Yolonda Kida, MD;  Location: [place] [facility];  Service: Cardiovascular;  Laterality: N/A;  . 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 TONSILLECTOMY  ~ 1947   2nd grade    Social History   Socioeconomic History  . Marital status: Widowed    Spouse name: Not on file  . Number of children: Not on file  . Years of education: Not on file  . Highest education level: Not on file  Occupational History  . Not on file  Tobacco Use  . Smoking status: Never Smoker  . Smokeless tobacco: Never Used  Substance and Sexual Activity  . Alcohol use: No  . Drug use: No  . Sexual activity: Never  Other Topics Concern  . Not on file  Social History Narrative   Lives at home with one of her sons. Independent at baseline.   Social Determinants of Health   Financial Resource Strain:   . Difficulty of Paying Living Expenses:   Food Insecurity:   . Worried About Charity fundraiser in the Last Year:   . [place] in the Last  Year:   Transportation Needs:   . Film/video editor (Medical):   [name] Lack of Transportation (Non-Medical):   Physical Activity:   . Days of Exercise per Week:   . Minutes of Exercise per Session:   Stress:   . Feeling of Stress :   Social Connections:   . Frequency of Communication with Friends and Family:   . Frequency of Social Gatherings with Friends and Family:   . Attends Religious Services:   . Active Member of Clubs or Organizations:   . Attends Archivist Meetings:   [name] Marital Status:   Intimate Partner Violence:   . Fear of Current or Ex-Partner:   . Emotionally Abused:   [name] Physically Abused:   . Sexually Abused:     Family History  Problem Relation Age of Onset  . Heart attack Father   . Diabetes Mother   . Cancer Mother     . Stroke Mother   . Breast cancer Cousin   . Breast cancer Cousin   . Breast cancer Cousin      Current Outpatient Medications:  .  ascorbic acid (VITAMIN C) 500 MG tablet, Take 1 tablet (500 mg total) by mouth daily., Disp: 30 tablet, Rfl: 0 .  aspirin EC 81 MG tablet, Take 81 mg by mouth at bedtime., Disp: , Rfl:  .  atorvastatin (LIPITOR) 40 MG tablet, Take 40 mg by mouth daily., Disp: , Rfl:  .  clopidogrel (PLAVIX) 75 MG tablet, Take 1 tablet (75 mg total) by mouth every morning. (Patient taking differently: Take 75 mg by mouth daily. ), Disp: 30 tablet, Rfl: 2 .  diltiazem (DILACOR XR) 240 MG 24 hr capsule, Take 240 mg by mouth daily., Disp: , Rfl:  .  gabapentin (NEURONTIN) 100 MG capsule, Take 100-200 mg by mouth daily as needed., Disp: , Rfl:  .  glipiZIDE (GLUCOTROL XL) 5 MG 24 hr tablet, Take 5 mg by mouth daily with breakfast., Disp: , Rfl:  .  guaiFENesin-dextromethorphan (ROBITUSSIN DM) 100-10 MG/5ML syrup, Take 5 mLs every 4 (four) hours as needed by mouth for cough., Disp: 118 mL, Rfl: 0 .  lamoTRIgine (LAMICTAL) 100 MG tablet, Take 100 mg by mouth daily., Disp: , Rfl:  .  levETIRAcetam (KEPPRA) 750 MG tablet, Take 750 mg by mouth 2 (two) times daily. , Disp: , Rfl:  .  metFORMIN (GLUCOPHAGE) 1000 MG tablet, Take 1 tablet (1,000 mg total) by mouth 2 (two) times daily., Disp: , Rfl:  .  nitroGLYCERIN (NITROSTAT) 0.4 MG SL tablet, Place 0.4 mg under the tongue every 5 (five) minutes as needed for chest pain., Disp: , Rfl:  .  nystatin cream (MYCOSTATIN), Apply 1 application topically 2 (two) times daily as needed (yeast infection). , Disp: , Rfl:  .  ramipril (ALTACE) 5 MG capsule, Take 5 mg by mouth daily., Disp: , Rfl:  .  ranitidine (ZANTAC) 150 MG tablet, Take 150 mg by mouth every morning. , Disp: , Rfl:  .  SIMBRINZA 1-0.2 % SUSP, Place 1 drop into both eyes 2 (two) times daily. , Disp: , Rfl:  .  timolol (TIMOPTIC) 0.5 % ophthalmic solution, Place 1 drop into both eyes  2 (two) times daily., Disp: , Rfl:  .  triamcinolone ointment (KENALOG) 0.1 %, Apply 1 application topically 3 (three) times daily as needed (itching from psoriasis)., Disp: , Rfl:  .  vitamin B-12 (CYANOCOBALAMIN) 1000 MCG tablet, Take 500 mcg by mouth every morning. , Disp: ,  Rfl:  .  vitamin E 400 UNIT capsule, Take 400 Units by mouth every morning. , Disp: , Rfl:  .  ranolazine (RANEXA) 500 MG 12 hr tablet, Take 1 tablet (500 mg total) by mouth 2 (two) times daily., Disp: 60 tablet, Rfl: 1 .  zinc sulfate 220 (50 Zn) MG capsule, Take 1 capsule (220 mg total) by mouth daily., Disp: 30 capsule, Rfl: 0  Physical exam:  Vitals:   04/04/20 1041  BP: (!) 157/56  Pulse: 70  Resp: 16  Temp: (!) 96 F (35.6 C)  TempSrc: Tympanic  SpO2: 100%  Weight: 115 lb 14.4 oz (52.6 kg)   Physical Exam Constitutional:      General: She is not in acute distress. Pulmonary:     Effort: Pulmonary effort is normal.  Skin:    General: Skin is warm and dry.  Neurological:     Mental Status: She is alert and oriented to person, place, and time.      CMP Latest Ref Rng & Units 01/03/2020  Glucose 70 - 99 mg/dL 120(H)  BUN 8 - 23 mg/dL 32(H)  Creatinine 0.44 - 1.00 mg/dL 0.87  Sodium 135 - 145 mmol/L 135  Potassium 3.5 - 5.1 mmol/L 4.8  Chloride 98 - 111 mmol/L 110  CO2 22 - 32 mmol/L 19(L)  Calcium 8.9 - 10.3 mg/dL 8.6(L)  Total Protein 6.5 - 8.1 g/dL -  Total Bilirubin 0.3 - 1.2 mg/dL -  Alkaline Phos 38 - 126 U/L -  AST 15 - 41 U/L -  ALT 0 - 44 U/L -   CBC Latest Ref Rng & Units 04/04/2020  WBC 4.0 - 10.5 K/uL 3.5(L)  Hemoglobin 12.0 - 15.0 g/dL 9.1(L)  Hematocrit 36 - 46 % 26.0(L)  Platelets 150 - 400 K/uL 115(L)     Assessment and plan- Patient is a 79 y.o. female with normocytic anemia likely multifactorial secondary to iron deficiency and anemia of chronic disease here for routine follow-up  Prior anemia work-up did reveal a component of iron deficiency for which patient received  IV iron.  Her hemoglobin has improved from 7-9 but currently remained stable around 9.  There has been no's further improvement.  Suspect this is secondary to chronic disease.  Today CBC also reveals mild leukopenia and thrombocytopenia which she has not had before.  Given her age and comorbidities as well as recent Covid I am inclined to monitor her anemia conservatively.  If there is a persistent downward trend in her anemia and all other cytopenias a bone marrow biopsy can be considered.   CBC with differential in 1 month and 2 months and I will see her back in 2 months.  Repeat iron studies in 2 months   Visit Diagnosis 1. Iron deficiency anemia, unspecified iron deficiency anemia type   2. Anemia of chronic disease      Dr. Randa Evens, MD, MPH [facility] [phone] 04/04/2020 4:03 PM

## 2020-04-15 ENCOUNTER — Telehealth: Payer: Self-pay | Admitting: Oncology

## 2020-04-15 NOTE — Telephone Encounter (Signed)
Patient's son phoned on this date and asked that patient's appt on 05-06-20 be moved as he was her transportation and he would not be able to bring her on that date. Appt moved per son's request.

## 2020-04-16 ENCOUNTER — Telehealth: Payer: Self-pay | Admitting: Adult Health Nurse Practitioner

## 2020-04-16 NOTE — Telephone Encounter (Signed)
Spoke with son and he is to call back after talking with his mother about setting up an appointment.  Ji Fairburn K. Garner Nash NP

## 2020-04-26 ENCOUNTER — Inpatient Hospital Stay
Admission: EM | Admit: 2020-04-26 | Discharge: 2020-05-08 | DRG: 280 | Disposition: A | Discharge status: Skilled Nursing Facility | Payer: Medicare Other | Attending: Family Medicine | Admitting: Family Medicine

## 2020-04-26 ENCOUNTER — Emergency Department: Payer: Medicare Other

## 2020-04-26 ENCOUNTER — Other Ambulatory Visit: Payer: Self-pay

## 2020-04-26 DIAGNOSIS — U071 COVID-19: Secondary | ICD-10-CM | POA: Diagnosis present

## 2020-04-26 DIAGNOSIS — Z885 Allergy status to narcotic agent status: Secondary | ICD-10-CM

## 2020-04-26 DIAGNOSIS — J9601 Acute respiratory failure with hypoxia: Secondary | ICD-10-CM

## 2020-04-26 DIAGNOSIS — Z66 Do not resuscitate: Secondary | ICD-10-CM | POA: Diagnosis present

## 2020-04-26 DIAGNOSIS — I25119 Atherosclerotic heart disease of native coronary artery with unspecified angina pectoris: Secondary | ICD-10-CM | POA: Diagnosis present

## 2020-04-26 DIAGNOSIS — L89323 Pressure ulcer of left buttock, stage 3: Secondary | ICD-10-CM | POA: Diagnosis present

## 2020-04-26 DIAGNOSIS — Z79899 Other long term (current) drug therapy: Secondary | ICD-10-CM | POA: Diagnosis not present

## 2020-04-26 DIAGNOSIS — E7800 Pure hypercholesterolemia, unspecified: Secondary | ICD-10-CM | POA: Diagnosis present

## 2020-04-26 DIAGNOSIS — I959 Hypotension, unspecified: Secondary | ICD-10-CM | POA: Diagnosis not present

## 2020-04-26 DIAGNOSIS — R040 Epistaxis: Secondary | ICD-10-CM | POA: Diagnosis not present

## 2020-04-26 DIAGNOSIS — I214 Non-ST elevation (NSTEMI) myocardial infarction: Principal | ICD-10-CM | POA: Diagnosis present

## 2020-04-26 DIAGNOSIS — Z888 Allergy status to other drugs, medicaments and biological substances status: Secondary | ICD-10-CM

## 2020-04-26 DIAGNOSIS — L8990 Pressure ulcer of unspecified site, unspecified stage: Secondary | ICD-10-CM | POA: Diagnosis present

## 2020-04-26 DIAGNOSIS — E1151 Type 2 diabetes mellitus with diabetic peripheral angiopathy without gangrene: Secondary | ICD-10-CM | POA: Diagnosis present

## 2020-04-26 DIAGNOSIS — Z955 Presence of coronary angioplasty implant and graft: Secondary | ICD-10-CM

## 2020-04-26 DIAGNOSIS — D61818 Other pancytopenia: Secondary | ICD-10-CM | POA: Diagnosis present

## 2020-04-26 DIAGNOSIS — Z8616 Personal history of COVID-19: Secondary | ICD-10-CM

## 2020-04-26 DIAGNOSIS — Z7982 Long term (current) use of aspirin: Secondary | ICD-10-CM | POA: Diagnosis not present

## 2020-04-26 DIAGNOSIS — D696 Thrombocytopenia, unspecified: Secondary | ICD-10-CM | POA: Diagnosis present

## 2020-04-26 DIAGNOSIS — E118 Type 2 diabetes mellitus with unspecified complications: Secondary | ICD-10-CM | POA: Diagnosis present

## 2020-04-26 DIAGNOSIS — Z23 Encounter for immunization: Secondary | ICD-10-CM | POA: Diagnosis not present

## 2020-04-26 DIAGNOSIS — I252 Old myocardial infarction: Secondary | ICD-10-CM | POA: Diagnosis not present

## 2020-04-26 DIAGNOSIS — I110 Hypertensive heart disease with heart failure: Secondary | ICD-10-CM | POA: Diagnosis present

## 2020-04-26 DIAGNOSIS — K219 Gastro-esophageal reflux disease without esophagitis: Secondary | ICD-10-CM | POA: Diagnosis present

## 2020-04-26 DIAGNOSIS — Z681 Body mass index (BMI) 19 or less, adult: Secondary | ICD-10-CM

## 2020-04-26 DIAGNOSIS — I5022 Chronic systolic (congestive) heart failure: Secondary | ICD-10-CM | POA: Diagnosis present

## 2020-04-26 DIAGNOSIS — R918 Other nonspecific abnormal finding of lung field: Secondary | ICD-10-CM | POA: Diagnosis present

## 2020-04-26 DIAGNOSIS — Z7901 Long term (current) use of anticoagulants: Secondary | ICD-10-CM

## 2020-04-26 DIAGNOSIS — L89152 Pressure ulcer of sacral region, stage 2: Secondary | ICD-10-CM | POA: Diagnosis present

## 2020-04-26 DIAGNOSIS — Z8249 Family history of ischemic heart disease and other diseases of the circulatory system: Secondary | ICD-10-CM

## 2020-04-26 DIAGNOSIS — Z8673 Personal history of transient ischemic attack (TIA), and cerebral infarction without residual deficits: Secondary | ICD-10-CM | POA: Diagnosis not present

## 2020-04-26 DIAGNOSIS — Z91048 Other nonmedicinal substance allergy status: Secondary | ICD-10-CM

## 2020-04-26 DIAGNOSIS — G40209 Localization-related (focal) (partial) symptomatic epilepsy and epileptic syndromes with complex partial seizures, not intractable, without status epilepticus: Secondary | ICD-10-CM | POA: Diagnosis present

## 2020-04-26 DIAGNOSIS — I25810 Atherosclerosis of coronary artery bypass graft(s) without angina pectoris: Secondary | ICD-10-CM | POA: Diagnosis present

## 2020-04-26 DIAGNOSIS — D638 Anemia in other chronic diseases classified elsewhere: Secondary | ICD-10-CM | POA: Diagnosis not present

## 2020-04-26 DIAGNOSIS — I10 Essential (primary) hypertension: Secondary | ICD-10-CM | POA: Diagnosis present

## 2020-04-26 DIAGNOSIS — E861 Hypovolemia: Secondary | ICD-10-CM | POA: Diagnosis not present

## 2020-04-26 DIAGNOSIS — R079 Chest pain, unspecified: Secondary | ICD-10-CM

## 2020-04-26 DIAGNOSIS — Z7984 Long term (current) use of oral hypoglycemic drugs: Secondary | ICD-10-CM

## 2020-04-26 DIAGNOSIS — E785 Hyperlipidemia, unspecified: Secondary | ICD-10-CM | POA: Diagnosis present

## 2020-04-26 DIAGNOSIS — E440 Moderate protein-calorie malnutrition: Secondary | ICD-10-CM | POA: Diagnosis present

## 2020-04-26 DIAGNOSIS — D649 Anemia, unspecified: Secondary | ICD-10-CM

## 2020-04-26 LAB — CBC WITH DIFFERENTIAL/PLATELET
Abs Immature Granulocytes: 0.09 10*3/uL — ABNORMAL HIGH (ref 0.00–0.07)
Basophils Absolute: 0 10*3/uL (ref 0.0–0.1)
Basophils Relative: 0 %
Eosinophils Absolute: 0 10*3/uL (ref 0.0–0.5)
Eosinophils Relative: 1 %
HCT: 17.5 % — ABNORMAL LOW (ref 36.0–46.0)
Hemoglobin: 6.1 g/dL — ABNORMAL LOW (ref 12.0–15.0)
Immature Granulocytes: 2 %
Lymphocytes Relative: 31 %
Lymphs Abs: 1.5 10*3/uL (ref 0.7–4.0)
MCH: 35.5 pg — ABNORMAL HIGH (ref 26.0–34.0)
MCHC: 34.9 g/dL (ref 30.0–36.0)
MCV: 101.7 fL — ABNORMAL HIGH (ref 80.0–100.0)
Monocytes Absolute: 0.4 10*3/uL (ref 0.1–1.0)
Monocytes Relative: 7 %
Neutro Abs: 2.9 10*3/uL (ref 1.7–7.7)
Neutrophils Relative %: 59 %
Platelets: 35 10*3/uL — ABNORMAL LOW (ref 150–400)
RBC: 1.72 MIL/uL — ABNORMAL LOW (ref 3.87–5.11)
RDW: 17.4 % — ABNORMAL HIGH (ref 11.5–15.5)
WBC: 4.9 10*3/uL (ref 4.0–10.5)
nRBC: 2.1 % — ABNORMAL HIGH (ref 0.0–0.2)

## 2020-04-26 LAB — HEMOGLOBIN A1C
Hgb A1c MFr Bld: 6.8 % — ABNORMAL HIGH (ref 4.8–5.6)
Mean Plasma Glucose: 148.46 mg/dL

## 2020-04-26 LAB — IRON AND TIBC
Iron: 160 ug/dL (ref 28–170)
Saturation Ratios: 48 % — ABNORMAL HIGH (ref 10.4–31.8)
TIBC: 335 ug/dL (ref 250–450)
UIBC: 175 ug/dL

## 2020-04-26 LAB — PREPARE RBC (CROSSMATCH)

## 2020-04-26 LAB — BASIC METABOLIC PANEL
Anion gap: 15 (ref 5–15)
BUN: 48 mg/dL — ABNORMAL HIGH (ref 8–23)
CO2: 14 mmol/L — ABNORMAL LOW (ref 22–32)
Calcium: 8.9 mg/dL (ref 8.9–10.3)
Chloride: 100 mmol/L (ref 98–111)
Creatinine, Ser: 1.42 mg/dL — ABNORMAL HIGH (ref 0.44–1.00)
GFR calc Af Amer: 40 mL/min — ABNORMAL LOW (ref >60)
GFR calc non Af Amer: 35 mL/min — ABNORMAL LOW (ref >60)
Glucose, Bld: 294 mg/dL — ABNORMAL HIGH (ref 70–99)
Potassium: 5.1 mmol/L (ref 3.5–5.1)
Sodium: 129 mmol/L — ABNORMAL LOW (ref 135–145)

## 2020-04-26 LAB — PATHOLOGIST SMEAR REVIEW

## 2020-04-26 LAB — FIBRINOGEN: Fibrinogen: 480 mg/dL — ABNORMAL HIGH (ref 210–475)

## 2020-04-26 LAB — FIBRIN DERIVATIVES D-DIMER (ARMC ONLY): Fibrin derivatives D-dimer (ARMC): 1154.87 ng{FEU}/mL — ABNORMAL HIGH (ref 0.00–499.00)

## 2020-04-26 LAB — TROPONIN I (HIGH SENSITIVITY)
Troponin I (High Sensitivity): 813 ng/L (ref <18)
Troponin I (High Sensitivity): 1246 ng/L (ref <18)
Troponin I (High Sensitivity): 2777 ng/L (ref <18)
Troponin I (High Sensitivity): 5303 ng/L

## 2020-04-26 LAB — RETICULOCYTES
Immature Retic Fract: 13.6 % (ref 2.3–15.9)
RBC.: 1.74 MIL/uL — ABNORMAL LOW (ref 3.87–5.11)
Retic Count, Absolute: 28.2 10*3/uL (ref 19.0–186.0)
Retic Ct Pct: 1.6 % (ref 0.4–3.1)

## 2020-04-26 LAB — PROTIME-INR
INR: 1.1 (ref 0.8–1.2)
Prothrombin Time: 14.1 s (ref 11.4–15.2)

## 2020-04-26 LAB — LACTATE DEHYDROGENASE
LDH: 242 U/L — ABNORMAL HIGH (ref 98–192)
LDH: 239 U/L — ABNORMAL HIGH (ref 98–192)

## 2020-04-26 LAB — GLUCOSE, CAPILLARY: Glucose-Capillary: 162 mg/dL — ABNORMAL HIGH (ref 70–99)

## 2020-04-26 LAB — VITAMIN B12: Vitamin B-12: 934 pg/mL — ABNORMAL HIGH (ref 180–914)

## 2020-04-26 LAB — FOLATE: Folate: 105 ng/mL (ref >5.9)

## 2020-04-26 LAB — SARS CORONAVIRUS 2 BY RT PCR (HOSPITAL ORDER, PERFORMED IN ~~LOC~~ HOSPITAL LAB): SARS Coronavirus 2: NEGATIVE

## 2020-04-26 LAB — FERRITIN: Ferritin: 5988 ng/mL — ABNORMAL HIGH (ref 11–307)

## 2020-04-26 LAB — APTT: aPTT: 32 s (ref 24–36)

## 2020-04-26 MED ORDER — LEVETIRACETAM 500 MG PO TABS
500.0000 mg | ORAL_TABLET | Freq: Two times a day (BID) | ORAL | Status: DC
  Administered 2020-04-26 – 2020-05-08 (×24): 500 mg via ORAL
  Filled 2020-04-26 (×25): qty 1

## 2020-04-26 MED ORDER — VITAMIN E 180 MG (400 UNIT) PO CAPS
400.0000 [IU] | ORAL_CAPSULE | ORAL | Status: DC
  Administered 2020-04-27 – 2020-05-08 (×13): 400 [IU] via ORAL
  Filled 2020-04-26 (×14): qty 1

## 2020-04-26 MED ORDER — NITROGLYCERIN 0.4 MG SL SUBL: 0.4000 mg | SUBLINGUAL_TABLET | SUBLINGUAL | Status: DC | PRN

## 2020-04-26 MED ORDER — ATORVASTATIN CALCIUM 20 MG PO TABS
40.0000 mg | ORAL_TABLET | Freq: Every day | ORAL | Status: DC
  Administered 2020-04-26 – 2020-05-07 (×12): 40 mg via ORAL
  Filled 2020-04-26 (×12): qty 2

## 2020-04-26 MED ORDER — SODIUM CHLORIDE 0.9 % IV SOLN: 250.0000 mL | INTRAVENOUS | Status: DC | PRN

## 2020-04-26 MED ORDER — FOLIC ACID 1 MG PO TABS
1.0000 mg | ORAL_TABLET | Freq: Every day | ORAL | Status: DC
  Administered 2020-04-27 – 2020-05-08 (×12): 1 mg via ORAL
  Filled 2020-04-26 (×12): qty 1

## 2020-04-26 MED ORDER — LEVETIRACETAM 500 MG PO TABS: 500.0000 mg | ORAL_TABLET | Freq: Two times a day (BID) | ORAL | Status: DC

## 2020-04-26 MED ORDER — SODIUM CHLORIDE 0.9% IV SOLUTION
Freq: Once | INTRAVENOUS | Status: DC
  Filled 2020-04-26: qty 250

## 2020-04-26 MED ORDER — SODIUM CHLORIDE 0.9% FLUSH
3.0000 mL | Freq: Two times a day (BID) | INTRAVENOUS | Status: DC
  Administered 2020-04-26 – 2020-05-08 (×24): 3 mL via INTRAVENOUS

## 2020-04-26 MED ORDER — ONDANSETRON HCL 4 MG/2ML IJ SOLN
4.0000 mg | Freq: Four times a day (QID) | INTRAMUSCULAR | Status: DC | PRN
  Administered 2020-04-27 – 2020-05-07 (×3): 4 mg via INTRAVENOUS
  Filled 2020-04-26 (×3): qty 2

## 2020-04-26 MED ORDER — RANOLAZINE ER 500 MG PO TB12
1000.0000 mg | ORAL_TABLET | Freq: Two times a day (BID) | ORAL | Status: DC
  Administered 2020-04-26 – 2020-04-27 (×2): 1000 mg via ORAL
  Filled 2020-04-26 (×3): qty 2

## 2020-04-26 MED ORDER — TIMOLOL MALEATE 0.5 % OP SOLN
1.0000 [drp] | Freq: Two times a day (BID) | OPHTHALMIC | Status: DC
  Administered 2020-04-26 – 2020-05-08 (×23): 1 [drp] via OPHTHALMIC
  Filled 2020-04-26 (×2): qty 5

## 2020-04-26 MED ORDER — CYANOCOBALAMIN 500 MCG PO TABS
500.0000 ug | ORAL_TABLET | ORAL | Status: DC
  Administered 2020-04-27 – 2020-05-08 (×12): 500 ug via ORAL
  Filled 2020-04-26 (×13): qty 1

## 2020-04-26 MED ORDER — LAMOTRIGINE 100 MG PO TABS
100.0000 mg | ORAL_TABLET | Freq: Every day | ORAL | Status: DC
  Administered 2020-04-26 – 2020-05-07 (×12): 100 mg via ORAL
  Filled 2020-04-26 (×12): qty 1

## 2020-04-26 MED ORDER — NITROGLYCERIN 2 % TD OINT
0.5000 [in_us] | TOPICAL_OINTMENT | Freq: Once | TRANSDERMAL | Status: AC
  Administered 2020-04-26: 0.5 [in_us] via TOPICAL

## 2020-04-26 MED ORDER — RANOLAZINE ER 500 MG PO TB12
500.0000 mg | ORAL_TABLET | Freq: Two times a day (BID) | ORAL | Status: DC
  Filled 2020-04-26: qty 1

## 2020-04-26 MED ORDER — ASPIRIN EC 81 MG PO TBEC
81.0000 mg | DELAYED_RELEASE_TABLET | Freq: Every day | ORAL | Status: DC
  Filled 2020-04-26: qty 1

## 2020-04-26 MED ORDER — INSULIN ASPART 100 UNIT/ML ~~LOC~~ SOLN
0.0000 [IU] | Freq: Three times a day (TID) | SUBCUTANEOUS | Status: DC
  Administered 2020-04-26 – 2020-04-27 (×4): 3 [IU] via SUBCUTANEOUS
  Administered 2020-04-28: 2 [IU] via SUBCUTANEOUS
  Administered 2020-04-28: 5 [IU] via SUBCUTANEOUS
  Administered 2020-04-28: 3 [IU] via SUBCUTANEOUS
  Administered 2020-04-29: 5 [IU] via SUBCUTANEOUS
  Administered 2020-04-29 (×2): 3 [IU] via SUBCUTANEOUS
  Administered 2020-04-30 – 2020-05-01 (×4): 5 [IU] via SUBCUTANEOUS
  Administered 2020-05-02: 8 [IU] via SUBCUTANEOUS
  Administered 2020-05-02: 3 [IU] via SUBCUTANEOUS
  Administered 2020-05-02: 5 [IU] via SUBCUTANEOUS
  Administered 2020-05-03: 8 [IU] via SUBCUTANEOUS
  Administered 2020-05-03: 11 [IU] via SUBCUTANEOUS
  Administered 2020-05-03: 3 [IU] via SUBCUTANEOUS
  Administered 2020-05-04: 8 [IU] via SUBCUTANEOUS
  Administered 2020-05-04: 5 [IU] via SUBCUTANEOUS
  Administered 2020-05-05: 2 [IU] via SUBCUTANEOUS
  Administered 2020-05-05: 3 [IU] via SUBCUTANEOUS
  Administered 2020-05-06 – 2020-05-07 (×4): 2 [IU] via SUBCUTANEOUS
  Administered 2020-05-07: 5 [IU] via SUBCUTANEOUS
  Administered 2020-05-07: 3 [IU] via SUBCUTANEOUS
  Administered 2020-05-08: 12:00:00 5 [IU] via SUBCUTANEOUS
  Administered 2020-05-08: 3 [IU] via SUBCUTANEOUS
  Filled 2020-04-26 (×34): qty 1

## 2020-04-26 MED ORDER — NITROGLYCERIN 0.4 MG SL SUBL
SUBLINGUAL_TABLET | SUBLINGUAL | Status: AC
  Administered 2020-04-26: 0.4 mg via SUBLINGUAL
  Filled 2020-04-26: qty 3

## 2020-04-26 MED ORDER — METOPROLOL SUCCINATE ER 25 MG PO TB24
25.0000 mg | ORAL_TABLET | Freq: Every day | ORAL | Status: DC
  Administered 2020-04-27: 25 mg via ORAL
  Filled 2020-04-26: qty 1

## 2020-04-26 MED ORDER — ACETAMINOPHEN 325 MG PO TABS
650.0000 mg | ORAL_TABLET | ORAL | Status: DC | PRN
  Administered 2020-04-27 – 2020-05-01 (×2): 650 mg via ORAL
  Filled 2020-04-26 (×2): qty 2

## 2020-04-26 MED ORDER — SODIUM CHLORIDE 0.9 % IV SOLN: 10.0000 mL/h | Freq: Once | INTRAVENOUS | Status: DC

## 2020-04-26 MED ORDER — TRIAMCINOLONE ACETONIDE 0.1 % EX OINT
1.0000 "application " | TOPICAL_OINTMENT | Freq: Three times a day (TID) | CUTANEOUS | Status: DC | PRN
  Filled 2020-04-26: qty 15

## 2020-04-26 MED ORDER — GABAPENTIN 100 MG PO CAPS
100.0000 mg | ORAL_CAPSULE | Freq: Every day | ORAL | Status: DC | PRN
  Filled 2020-04-26: qty 1

## 2020-04-26 MED ORDER — ASCORBIC ACID 500 MG PO TABS: 500.0000 mg | ORAL_TABLET | Freq: Every day | ORAL | Status: DC

## 2020-04-26 MED ORDER — SODIUM CHLORIDE 0.9% FLUSH
3.0000 mL | INTRAVENOUS | Status: DC | PRN
  Administered 2020-04-27: 3 mL via INTRAVENOUS

## 2020-04-26 MED ORDER — ZINC SULFATE 220 (50 ZN) MG PO CAPS: 220.0000 mg | ORAL_CAPSULE | Freq: Every day | ORAL | Status: DC

## 2020-04-26 MED ORDER — DILTIAZEM HCL ER COATED BEADS 240 MG PO CP24
240.0000 mg | ORAL_CAPSULE | Freq: Every day | ORAL | Status: DC
  Filled 2020-04-26: qty 1
  Filled 2020-04-26: qty 2

## 2020-04-26 MED ORDER — RAMIPRIL 5 MG PO CAPS
5.0000 mg | ORAL_CAPSULE | Freq: Every day | ORAL | Status: DC
  Administered 2020-04-27: 5 mg via ORAL
  Filled 2020-04-26 (×2): qty 1

## 2020-04-26 MED ORDER — ASPIRIN 300 MG RE SUPP: 300.0000 mg | RECTAL | Status: AC

## 2020-04-26 MED ORDER — ASPIRIN 81 MG PO CHEW: 324.0000 mg | CHEWABLE_TABLET | ORAL | Status: AC

## 2020-04-26 MED ORDER — METOPROLOL SUCCINATE ER 50 MG PO TB24: 25.0000 mg | ORAL_TABLET | Freq: Every day | ORAL | Status: DC

## 2020-04-26 MED ORDER — ASPIRIN EC 81 MG PO TBEC: 81.0000 mg | DELAYED_RELEASE_TABLET | Freq: Every day | ORAL | Status: DC

## 2020-04-26 NOTE — ED Notes (Signed)
Blood consent signed by pts son and POA

## 2020-04-26 NOTE — Consult Note (Signed)
CARDIOLOGY CONSULT NOTE               Patient ID: Leah FishmanDonnie R Zeitz MRN: [id number] DOB/AGE: December 22, 1940 79 y.o.  Admit date: 04/26/2020 Referring Physician [place] Primary Physician [place] Primary Cardiologist Gwen PoundsKowalski Reason for Consultation chest pain  HPI: 79 year old female referred for chest pain.  The patient has complex coronary artery disease including CABG x4 in 1997, NSTEMI 2017, multiple stents, STEMI in 12/2019, treated medically, chronic angina on Ranexa, hypertension, hyperlipidemia, type II diabetes, chronic iron deficiency anemia, followed by hematology, and seizure disorder. During cardiac catheterization in 12/2019, the patient had acute respiratory failure, requiring intubation; findings were largely unchanged from 2017 with LVEF 35%, with severe multivessel native disease with occlusion proximal LAD, circumflex, and RCA, with total occlusion of SVG to RCA, circumflex and diagonal, with patent LIMA to mid LAD. The patient experienced acute chest pain this morning with radiation to her jaw and left arm with associated shortness of breath, which was more significant than her usual chronic angina. She also had one episode of vomiting. The patient had a possible syncopal episode the day prior in the setting of low blood sugar of 70. ECG on arrival revealed sinus rhythm at a rate of 80 bpm with ST elevation in aVR and ST depression inferolaterally. ECG was reviewed by Dr. Excell Seltzerooper on STEMI call, who canceled Code STEMI, but recommended observation as patient's known coronary anatomy has not been amendable to PCI, trend troponin, and initiate heparin. Admission labs notable for high sensitivity troponin 813-> 1246, hemoglobin 6.1, hematocrit 17.5, platelets 35, BUN 48, creatinine 1.42, GFR 35.  Chest x-ray negative for acute abnormality.  Due to the patient's marked anemia and thrombocytopenia, heparin drip was not initiated, and the patient began transfusion of 1 unit PRBC. The patient's  chest pain of 9/10 in intensity has improved to 8/10 with nitroglycerin paste.   Review of systems complete and found to be negative unless listed above     Past Medical History:  Diagnosis Date  . Acid reflux   . Arthritis    "all over"  . Chronic stable angina ([facility])   . Coronary artery disease   . Hypercholesterolemia   . Hypertension   . Myocardial infarction ([facility]) 1991; 07/06/2015  . NSTEMI (non-ST elevated myocardial infarction) ([facility]) 10/22/2015  . Psoriatic arthritis ([facility])   . 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 TIA (transient ischemic attack)    "several over the last 20 years" (10/22/2015)  . Type II diabetes mellitus ([facility])     Past Surgical History:  Procedure Laterality Date  . ABDOMINAL HYSTERECTOMY  1973  . APPENDECTOMY  1950s   elementary school  . CARDIAC CATHETERIZATION  X 2-3  . CARDIAC CATHETERIZATION N/A 11/21/2015   Procedure: Left Heart Cath and Coronary Angiography;  Surgeon: Corky CraftsJayadeep S Varanasi, MD;  [facility];  Service: Cardiovascular;  Laterality: N/A;  . CARDIAC CATHETERIZATION  11/21/2015   Procedure: Coronary Stent Intervention;  Surgeon: Corky CraftsJayadeep S Varanasi, MD;  [facility];  Service: Cardiovascular;;  . CARDIAC CATHETERIZATION N/A 01/29/2016   Procedure: Left Heart Cath and Cors/Grafts Angiography;  Surgeon: Tonny BollmanMichael Cooper, MD;  [facility];  Service: Cardiovascular;  Laterality: N/A;  . CARDIAC CATHETERIZATION N/A 07/22/2016   Procedure: Left Heart Cath and Coronary Angiography;  Surgeon: Iran OuchMuhammad A Arida, MD;  [facility]: [facility];  Service: Cardiovascular;  Laterality:  N/A;  . CAROTID STENT INSERTION Bilateral 2011-2012   right-left  . CATARACT EXTRACTION W/ INTRAOCULAR LENS  IMPLANT, BILATERAL Bilateral 2009-2010  . CHOLECYSTECTOMY OPEN  ~ 2014  . CORONARY ANGIOPLASTY    . CORONARY ANGIOPLASTY WITH STENT PLACEMENT   11/21/2015  . CORONARY ARTERY BYPASS GRAFT  08/1995  . CORONARY/GRAFT ACUTE MI REVASCULARIZATION N/A 12/28/2019   Procedure: Coronary/Graft Acute MI Revascularization;  Surgeon: Alwyn Pea, MD;  Location: [facility];  Service: Cardiovascular;  Laterality: N/A;  . DILATION AND CURETTAGE OF UTERUS  X 1  . LEFT HEART CATH AND CORONARY ANGIOGRAPHY N/A 12/28/2019   Procedure: LEFT HEART CATH AND CORONARY ANGIOGRAPHY;  Surgeon: Alwyn Pea, MD;  Location: [facility];  Service: Cardiovascular;  Laterality: N/A;  . 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 TONSILLECTOMY  ~ 1947   2nd grade    (Not in a hospital admission)  Social History   Socioeconomic History  . Marital status: Widowed    Spouse name: Not on file  . Number of children: Not on file  . Years of education: Not on file  . Highest education level: Not on file  Occupational History  . Not on file  Tobacco Use  . Smoking status: Never Smoker  . Smokeless tobacco: Never Used  Substance and Sexual Activity  . Alcohol use: No  . Drug use: No  . Sexual activity: Never  Other Topics Concern  . Not on file  Social History Narrative   Lives at home with one of her sons. Independent at baseline.   Social Determinants of Health   Financial Resource Strain:   . Difficulty of Paying Living Expenses: Not on file  Food Insecurity:   . Worried About Programme researcher, broadcasting/film/video in the Last Year: Not on file  . Ran Out of Food in the Last Year: Not on file  Transportation Needs:   . Lack of Transportation (Medical): Not on file  . Lack of Transportation (Non-Medical): Not on file  Physical Activity:   . Days of Exercise per Week: Not on file  . Minutes of Exercise per Session: Not on file  Stress:   . Feeling of Stress : Not on file  Social Connections:   . Frequency of Communication with Friends and Family: Not on file  . Frequency of Social Gatherings  with Friends and Family: Not on file  . Attends Religious Services: Not on file  . Active Member of Clubs or Organizations: Not on file  . Attends Banker Meetings: Not on file  . Marital Status: Not on file  Intimate Partner Violence:   . Fear of Current or Ex-Partner: Not on file  . Emotionally Abused: Not on file  . Physically Abused: Not on file  . Sexually Abused: Not on file    Family History  Problem Relation Age of Onset  . Heart attack Father   . Diabetes Mother   . Cancer Mother   . Stroke Mother   . Breast cancer Cousin   . Breast cancer Cousin   . Breast cancer Cousin       Review of systems complete and found to be negative unless listed above      PHYSICAL EXAM  General: Frail elderly female sitting up in bed in no acute distress HEENT:  Normocephalic and atramatic Neck:  No JVD.  Lungs: Clear bilaterally to auscultation, normal effort of breathing  on room air Heart: HRRR . Normal S1 and S2 without gallops or murmurs.  Abdomen: nondistended Msk:  Back normal, gait not assessed. No obvious deformities. Extremities: No clubbing, cyanosis or edema.   Neuro: Alert and oriented X 3. Psych:  Good affect, responds appropriately  Skin: Pale, dry, no diaphoresis  Labs:   Lab Results  Component Value Date   WBC 4.9 04/26/2020   HGB 6.1 (L) 04/26/2020   HCT 17.5 (L) 04/26/2020   MCV 101.7 (H) 04/26/2020   PLT 35 (L) 04/26/2020    Recent Labs  Lab 04/26/20 1134  NA 129*  K 5.1  CL 100  CO2 14*  BUN 48*  CREATININE 1.42*  CALCIUM 8.9  GLUCOSE 294*   Lab Results  Component Value Date   CKTOTAL 91 12/14/2014   CKMB 4.6 12/14/2014   TROPONINI <0.03 12/17/2017    Lab Results  Component Value Date   CHOL 137 12/28/2019   CHOL 158 06/26/2017   CHOL 140 07/22/2016   Lab Results  Component Value Date   HDL 44 12/28/2019   HDL 48 06/26/2017   HDL 44 07/22/2016   Lab Results  Component Value Date   LDLCALC 68 12/28/2019    LDLCALC 47 06/26/2017   LDLCALC 65 07/22/2016   Lab Results  Component Value Date   TRIG 137 12/31/2019   TRIG 72 12/28/2019   TRIG 127 12/28/2019   Lab Results  Component Value Date   CHOLHDL 3.1 12/28/2019   CHOLHDL 3.3 06/26/2017   CHOLHDL 3.2 07/22/2016   No results found for: LDLDIRECT    Radiology: DG Chest Portable 1 View  Result Date: 04/26/2020 CLINICAL DATA:  Chest pain that started this morning pain all over to LEFT jaw and LEFT arm EXAM: PORTABLE CHEST 1 VIEW COMPARISON:  Dec 29, 2019 FINDINGS: Pacer defibrillator pad overlies the central chest and LEFT lower chest. Trachea midline. Heart size remains enlarged post median sternotomy for CABG. Hilar structures are stable. Lungs are clear.  No sign of pleural effusion. No acute skeletal process to the extent evaluated. IMPRESSION: No effusion or consolidation. Stable findings of postoperative changes related to median sternotomy and CABG. Electronically Signed   By: Leah Small M.D.   On: 04/26/2020 11:53    EKG: sinus rhythm with ST elevation aVR and inferolateral ST depression  ASSESSMENT AND PLAN:  1. Chest pain, with known extensive coronary artery disease, previously not amendable to intervention, with abnormal ECG and elevated troponin of 813 and 1246, in the setting of marked anemia, which could be contributing to demand supply ischemia. Patient has chronic angina, though chest pain today worse than usual. 2. Coronary artery disease, CABG x4 in 1997, NSTEMI 2017, multiple stents, STEMI in 12/2019, treated medically, chronic angina on Ranexa; previous cardiac catheterization in 12/2019 complicated by acute respiratory distress requiring intubation with findings overall unchanged from previous cath in 2017, with LVEF 35%, with severe multivessel native disease with occlusion proximal LAD, circumflex, and RCA, with total occlusion of SVG to RCA, circumflex and diagonal, with patent LIMA to mid LAD.  3. Marked  anemia/thrombocytopneia, hemoglobin 6.1, platelets 35, heme negative stool, followed by hematology. No evidence of gross GI bleeding. Currently receiving 1 unit PRBC  Recommendations: 1. Defer initiation of heparin drip at this time in light of thrombocytopenia and marked anemia. 2. Continue aspirin, atorvastatin, ramipril 3. Increase Ranexa to 1000 mg BID 4. 2D echocardiogram 5. Recommend decreasing dose of Cardizem CD to 180 mg and starting metoprolol  succinate 25 mg in AM 6. Further recommendations pending patient's initial course  Signed: Rockie Neighbours 04/26/2020, 2:25 PM

## 2020-04-26 NOTE — ED Notes (Signed)
Dr. Smith Robert at bedside speaking with pt and her son

## 2020-04-26 NOTE — Consult Note (Signed)
Hematology/Oncology Consult note [facility] [phone] [phone]  Patient Care Team: Idelle Crouch, MD as PCP - General (Internal Medicine) Sindy Guadeloupe, MD as Consulting Physician (Oncology)   Name of the patient: Leah Small  [id number]  09-14-1940    Reason for consult anemia/thrombocytopenia   Requesting physician: Dr. Francine Graven  Date of visit: 04/26/2020   History of presenting illness- Patient is a 79 year old female with a past medical history significant for CVA, CABG, hypertension, hyperlipidemia, diabetes among other medical problems who sees me as an outpatient for anemia.  She was also diagnosed with Covid pneumonia in May 2021.  She has received blood transfusions in the past for her anemia.  Work-up in April 2021 showed evidence of iron deficiency with elevated TIBC and a low ferritin of 11.  She also received IV iron in the past for that.  Most recently seen by me on 04/04/2020 when her hemoglobin had improved from 7-9.1.  I plan at that time was to watch this conservatively given her age and comorbidities with consideration for bone marrow biopsy if cytopenias worsen.  At that time she was noted to have a white count of 3.5 and a platelet count of 115.  Her white cell count and platelet counts have otherwise been normal over the last 1 year.  She now presented to the hospital with symptoms of chest pain on arrival she was found to have a hemoglobin of 6.1 with an MCV of 101.7 and a platelet count of 35.  Differential also showed presence of immature granulocytes and increase in nucleated RBCs to 2.1%.  Hematology consulted for further management  Patient currently reports feeling fatigued and having exertional shortness of breath.  Her son Leah Small is at her bedside.  Reports that she has been getting weaker over the last 2 weeks and had a syncopal episode that led to a fall a week ago.    ECOG PS- 3  Pain scale- 0   Review of  systems- Review of Systems  Constitutional: Positive for malaise/fatigue and weight loss. Negative for chills and fever.  HENT: Negative for congestion, ear discharge and nosebleeds.   Eyes: Negative for blurred vision.  Respiratory: Positive for shortness of breath. Negative for cough, hemoptysis, sputum production and wheezing.   Cardiovascular: Negative for chest pain, palpitations, orthopnea and claudication.  Gastrointestinal: Negative for abdominal pain, blood in stool, constipation, diarrhea, heartburn, melena, nausea and vomiting.  Genitourinary: Negative for dysuria, flank pain, frequency, hematuria and urgency.  Musculoskeletal: Negative for back pain, joint pain and myalgias.  Skin: Negative for rash.  Neurological: Negative for dizziness, tingling, focal weakness, seizures, weakness and headaches.  Endo/Heme/Allergies: Does not bruise/bleed easily.  Psychiatric/Behavioral: Negative for depression and suicidal ideas. The patient does not have insomnia.     Allergies  Allergen Reactions  . Talwin [Pentazocine] Anaphylaxis and Swelling    Throat swelling  . Valium [Diazepam] Other (See Comments)    Makes her feel like she's flying  . Vicodin [Hydrocodone-Acetaminophen] Nausea And Vomiting  . Adhesive [Tape] Other (See Comments)    Bruising and TEARING!!!  Please use "paper" tape  . Beta Adrenergic Blockers Other (See Comments)    Pt states she not allergic  . Levofloxacin Swelling and Other (See Comments)    Tongue swells   . Simvastatin Swelling and Rash    Mouth swelling    Patient Active Problem List   Diagnosis Date Noted  . Chest pain due to CAD ([place])  04/26/2020  . Anemia 04/26/2020  . Thrombocytopenia ([place]) 04/26/2020  . Iron deficiency anemia 01/25/2020  . History of ETT   . Acute respiratory failure with hypoxia ([place])   . Palliative care by specialist   . Goals of care, counseling/discussion   . Pressure ulcer 12/30/2019  . STEMI (ST elevation myocardial  infarction) ([place]) 12/28/2019  . Malnutrition of moderate degree 08/12/2017  . Hypotension   . Carotid stenosis 11/12/2016  . Hypertensive heart disease 01/29/2016  . Normocytic anemia 01/29/2016  . History of vaginal bleeding 01/29/2016  . GERD (gastroesophageal reflux disease) 01/29/2016  . NSTEMI (non-ST elevated myocardial infarction) ([place]) 10/22/2015  . Non-STEMI (non-ST elevated myocardial infarction) ([place]) 10/22/2015  . 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 Coronary atherosclerosis of native coronary artery 10/22/2015  . Angina pectoris ([place])   . Memory deficit   . Stroke ([place]) 07/06/2015  . Diabetes mellitus with complication ([place]) 35/36/1443  . Essential hypertension 07/06/2015  . Hyperlipidemia 07/06/2015  . Left-sided weakness      Past Medical History:  Diagnosis Date  . Acid reflux   . Arthritis    "all over"  . Chronic stable angina ([place])   . Coronary artery disease   . Hypercholesterolemia   . Hypertension   . Myocardial infarction ([place]) 1991; 07/06/2015  . NSTEMI (non-ST elevated myocardial infarction) ([place]) 10/22/2015  . Psoriatic arthritis ([place])   . Seizures ([place])    "complex partial; 1st one was 07/06/2015; might have had one today" (10/22/2015)  . TIA (transient ischemic attack)    "several over the last 20 years" (10/22/2015)  . Type II diabetes mellitus ([place])      Past Surgical History:  Procedure Laterality Date  . ABDOMINAL HYSTERECTOMY  1973  . APPENDECTOMY  1950s   elementary school  . CARDIAC CATHETERIZATION  X 2-3  . CARDIAC CATHETERIZATION N/A 11/21/2015   Procedure: Left Heart Cath and Coronary Angiography;  Surgeon: Jettie Booze, MD;  Location: [facility];  Service: Cardiovascular;  Laterality: N/A;  . CARDIAC CATHETERIZATION  11/21/2015   Procedure: Coronary Stent Intervention;  Surgeon: Jettie Booze, MD;  Location: [facility];  Service: Cardiovascular;;  . CARDIAC CATHETERIZATION N/A 01/29/2016    Procedure: Left Heart Cath and Cors/Grafts Angiography;  Surgeon: Sherren Mocha, MD;  Location: [facility];  Service: Cardiovascular;  Laterality: N/A;  . CARDIAC CATHETERIZATION N/A 07/22/2016   Procedure: Left Heart Cath and Coronary Angiography;  Surgeon: Wellington Hampshire, MD;  Location: [facility];  Service: Cardiovascular;  Laterality: N/A;  . CAROTID STENT INSERTION Bilateral 2011-2012   right-left  . CATARACT EXTRACTION W/ INTRAOCULAR LENS  IMPLANT, BILATERAL Bilateral 2009-2010  . CHOLECYSTECTOMY OPEN  ~ 2014  . CORONARY ANGIOPLASTY    . CORONARY ANGIOPLASTY WITH STENT PLACEMENT  11/21/2015  . CORONARY ARTERY BYPASS GRAFT  08/1995  . CORONARY/GRAFT ACUTE MI REVASCULARIZATION N/A 12/28/2019   Procedure: Coronary/Graft Acute MI Revascularization;  Surgeon: Yolonda Kida, MD;  Location: [facility];  Service: Cardiovascular;  Laterality: N/A;  . DILATION AND CURETTAGE OF UTERUS  X 1  . LEFT HEART CATH AND CORONARY ANGIOGRAPHY N/A 12/28/2019   Procedure: LEFT HEART CATH AND CORONARY ANGIOGRAPHY;  Surgeon: Yolonda Kida, MD;  Location: [facility];  Service: Cardiovascular;  Laterality: N/A;  . 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 TONSILLECTOMY  ~ 1947   2nd grade  Social History   Socioeconomic History  . Marital status: Widowed    Spouse name: Not on file  . Number of children: Not on file  . Years of education: Not on file  . Highest education level: Not on file  Occupational History  . Not on file  Tobacco Use  . Smoking status: Never Smoker  . Smokeless tobacco: Never Used  Substance and Sexual Activity  . Alcohol use: No  . Drug use: No  . Sexual activity: Never  Other Topics Concern  . Not on file  Social History Narrative   Lives at home with one of her sons. Independent at baseline.   Social Determinants of Health   Financial Resource Strain:   . Difficulty of  Paying Living Expenses: Not on file  Food Insecurity:   . Worried About Charity fundraiser in the Last Year: Not on file  . Ran Out of Food in the Last Year: Not on file  Transportation Needs:   . Lack of Transportation (Medical): Not on file  . Lack of Transportation (Non-Medical): Not on file  Physical Activity:   . Days of Exercise per Week: Not on file  . Minutes of Exercise per Session: Not on file  Stress:   . Feeling of Stress : Not on file  Social Connections:   . Frequency of Communication with Friends and Family: Not on file  . Frequency of Social Gatherings with Friends and Family: Not on file  . Attends Religious Services: Not on file  . Active Member of Clubs or Organizations: Not on file  . Attends Archivist Meetings: Not on file  . Marital Status: Not on file  Intimate Partner Violence:   . Fear of Current or Ex-Partner: Not on file  . Emotionally Abused: Not on file  . Physically Abused: Not on file  . Sexually Abused: Not on file     Family History  Problem Relation Age of Onset  . Heart attack Father   . Diabetes Mother   . Cancer Mother   . Stroke Mother   . Breast cancer Cousin   . Breast cancer Cousin   . Breast cancer Cousin      Current Facility-Administered Medications:  .  0.9 %  sodium chloride infusion (Manually program via Guardrails IV Fluids), , Intravenous, Once, Agbata, Tochukwu, MD .  0.9 %  sodium chloride infusion, 10 mL/hr, Intravenous, Once, Blake Divine, MD .  0.9 %  sodium chloride infusion, 250 mL, Intravenous, PRN, Agbata, Tochukwu, MD .  acetaminophen (TYLENOL) tablet 650 mg, 650 mg, Oral, Q4H PRN, Agbata, Tochukwu, MD .  ascorbic acid (VITAMIN C) tablet 500 mg, 500 mg, Oral, Daily, Agbata, Tochukwu, MD .  aspirin chewable tablet 324 mg, 324 mg, Oral, NOW **OR** aspirin suppository 300 mg, 300 mg, Rectal, NOW, Agbata, Tochukwu, MD .  Derrill Memo ON 04/27/2020] aspirin EC tablet 81 mg, 81 mg, Oral, Daily, Agbata, Tochukwu,  MD .  atorvastatin (LIPITOR) tablet 40 mg, 40 mg, Oral, Daily, Agbata, Tochukwu, MD .  diltiazem (CARDIZEM CD) 24 hr capsule 240 mg, 240 mg, Oral, Daily, Agbata, Tochukwu, MD .  folic acid (FOLVITE) tablet 1 mg, 1 mg, Oral, Daily, Agbata, Tochukwu, MD .  gabapentin (NEURONTIN) capsule 100 mg, 100 mg, Oral, Daily PRN, Agbata, Tochukwu, MD .  insulin aspart (novoLOG) injection 0-15 Units, 0-15 Units, Subcutaneous, TID WC, Agbata, Tochukwu, MD .  lamoTRIgine (LAMICTAL) tablet 100 mg, 100 mg, Oral, Daily, Agbata, Tochukwu, MD .  levETIRAcetam (KEPPRA) tablet 500 mg, 500 mg, Oral, BID, Agbata, Tochukwu, MD .  nitroGLYCERIN (NITROSTAT) SL tablet 0.4 mg, 0.4 mg, Sublingual, Q5 min PRN, Chesley Noon, MD, 0.4 mg at 04/26/20 1318 .  nitroGLYCERIN (NITROSTAT) SL tablet 0.4 mg, 0.4 mg, Sublingual, Q5 min PRN, Agbata, Tochukwu, MD .  nitroGLYCERIN (NITROSTAT) SL tablet 0.4 mg, 0.4 mg, Sublingual, Q5 Min x 3 PRN, Agbata, Tochukwu, MD .  ondansetron (ZOFRAN) injection 4 mg, 4 mg, Intravenous, Q6H PRN, Agbata, Tochukwu, MD .  ramipril (ALTACE) capsule 5 mg, 5 mg, Oral, Daily, Agbata, Tochukwu, MD .  ranolazine (RANEXA) 12 hr tablet 500 mg, 500 mg, Oral, BID, Agbata, Tochukwu, MD .  sodium chloride flush (NS) 0.9 % injection 3 mL, 3 mL, Intravenous, Q12H, Agbata, Tochukwu, MD .  sodium chloride flush (NS) 0.9 % injection 3 mL, 3 mL, Intravenous, PRN, Agbata, Tochukwu, MD .  timolol (TIMOPTIC) 0.5 % ophthalmic solution 1 drop, 1 drop, Both Eyes, BID, Agbata, Tochukwu, MD .  triamcinolone ointment (KENALOG) 0.1 % 1 application, 1 application, Topical, TID PRN, Agbata, Tochukwu, MD .  Melene Muller ON 04/27/2020] vitamin B-12 (CYANOCOBALAMIN) tablet 500 mcg, 500 mcg, Oral, BH-q7a, Agbata, Tochukwu, MD .  Melene Muller ON 04/27/2020] vitamin E capsule 400 Units, 400 Units, Oral, BH-q7a, Agbata, Tochukwu, MD .  zinc sulfate capsule 220 mg, 220 mg, Oral, Daily, Agbata, Tochukwu, MD  Current Outpatient Medications:  .  ascorbic  acid (VITAMIN C) 500 MG tablet, Take 1 tablet (500 mg total) by mouth daily., Disp: 30 tablet, Rfl: 0 .  aspirin EC 81 MG tablet, Take 81 mg by mouth at bedtime., Disp: , Rfl:  .  atorvastatin (LIPITOR) 40 MG tablet, Take 40 mg by mouth daily., Disp: , Rfl:  .  clopidogrel (PLAVIX) 75 MG tablet, Take 1 tablet (75 mg total) by mouth every morning. (Patient taking differently: Take 75 mg by mouth daily. ), Disp: 30 tablet, Rfl: 2 .  diltiazem (CARDIZEM CD) 240 MG 24 hr capsule, Take 240 mg by mouth daily., Disp: , Rfl:  .  folic acid (FOLVITE) 1 MG tablet, Take 1 mg by mouth daily., Disp: , Rfl:  .  gabapentin (NEURONTIN) 100 MG capsule, Take 100-200 mg by mouth daily as needed., Disp: , Rfl:  .  glipiZIDE (GLUCOTROL XL) 5 MG 24 hr tablet, Take 5 mg by mouth daily with breakfast., Disp: , Rfl:  .  lamoTRIgine (LAMICTAL) 100 MG tablet, Take 100 mg by mouth daily., Disp: , Rfl:  .  levETIRAcetam (KEPPRA) 500 MG tablet, Take 500 mg by mouth 2 (two) times daily., Disp: , Rfl:  .  metFORMIN (GLUCOPHAGE) 1000 MG tablet, Take 1 tablet (1,000 mg total) by mouth 2 (two) times daily., Disp: , Rfl:  .  nitroGLYCERIN (NITROSTAT) 0.4 MG SL tablet, Place 0.4 mg under the tongue every 5 (five) minutes as needed for chest pain., Disp: , Rfl:  .  nystatin cream (MYCOSTATIN), Apply 1 application topically 2 (two) times daily as needed (yeast infection). , Disp: , Rfl:  .  ramipril (ALTACE) 5 MG capsule, Take 5 mg by mouth daily., Disp: , Rfl:  .  ranolazine (RANEXA) 500 MG 12 hr tablet, Take 1 tablet (500 mg total) by mouth 2 (two) times daily., Disp: 60 tablet, Rfl: 1 .  SIMBRINZA 1-0.2 % SUSP, Place 1 drop into both eyes 2 (two) times daily. , Disp: , Rfl:  .  timolol (TIMOPTIC) 0.5 % ophthalmic solution, Place 1 drop into both eyes 2 (two) times  daily., Disp: , Rfl:  .  triamcinolone ointment (KENALOG) 0.1 %, Apply 1 application topically 3 (three) times daily as needed (itching from psoriasis)., Disp: , Rfl:  .   vitamin B-12 (CYANOCOBALAMIN) 1000 MCG tablet, Take 500 mcg by mouth every morning. , Disp: , Rfl:  .  vitamin E 400 UNIT capsule, Take 400 Units by mouth every morning. , Disp: , Rfl:  .  zinc sulfate 220 (50 Zn) MG capsule, Take 1 capsule (220 mg total) by mouth daily., Disp: 30 capsule, Rfl: 0   Physical exam:  Vitals:   04/26/20 1400 04/26/20 1415 04/26/20 1430 04/26/20 1445  BP: (!) 148/60 (!) 146/71 140/65 115/89  Pulse:      Resp: (!) 22 18 (!) 23 20  Temp:      TempSrc:      SpO2:      Weight:      Height:       Physical Exam Constitutional:      Comments: Thin elderly frail woman sitting in a hospital bed.  She is currently receiving blood transfusion.  Periorbital hematoma from recent fall noted  Cardiovascular:     Rate and Rhythm: Normal rate and regular rhythm.     Heart sounds: Normal heart sounds.  Pulmonary:     Effort: Pulmonary effort is normal.     Breath sounds: Normal breath sounds.  Abdominal:     General: Bowel sounds are normal.     Palpations: Abdomen is soft.  Skin:    General: Skin is warm and dry.  Neurological:     Mental Status: She is alert and oriented to person, place, and time.        CMP Latest Ref Rng & Units 04/26/2020  Glucose 70 - 99 mg/dL 294(H)  BUN 8 - 23 mg/dL 48(H)  Creatinine 0.44 - 1.00 mg/dL 1.42(H)  Sodium 135 - 145 mmol/L 129(L)  Potassium 3.5 - 5.1 mmol/L 5.1  Chloride 98 - 111 mmol/L 100  CO2 22 - 32 mmol/L 14(L)  Calcium 8.9 - 10.3 mg/dL 8.9  Total Protein 6.5 - 8.1 g/dL -  Total Bilirubin 0.3 - 1.2 mg/dL -  Alkaline Phos 38 - 126 U/L -  AST 15 - 41 U/L -  ALT 0 - 44 U/L -   CBC Latest Ref Rng & Units 04/26/2020  WBC 4.0 - 10.5 K/uL 4.9  Hemoglobin 12.0 - 15.0 g/dL 6.1(L)  Hematocrit 36 - 46 % 17.5(L)  Platelets 150 - 400 K/uL 35(L)    '@IMAGES'$ @  DG Chest Portable 1 View  Result Date: 04/26/2020 CLINICAL DATA:  Chest pain that started this morning pain all over to LEFT jaw and LEFT arm EXAM: PORTABLE CHEST 1  VIEW COMPARISON:  Dec 29, 2019 FINDINGS: Pacer defibrillator pad overlies the central chest and LEFT lower chest. Trachea midline. Heart size remains enlarged post median sternotomy for CABG. Hilar structures are stable. Lungs are clear.  No sign of pleural effusion. No acute skeletal process to the extent evaluated. IMPRESSION: No effusion or consolidation. Stable findings of postoperative changes related to median sternotomy and CABG. Electronically Signed   By: Zetta Bills M.D.   On: 04/26/2020 11:53    Assessment and plan- Patient is a 79 y.o. female with multiple comorbidities admitted for chest pain found to have acute on chronic anemia and worsening thrombocytopenia  Patient first noted to have mild thrombocytopenia with a platelet count of 1153 weeks ago which has worsened to 35 today.  Also  her hemoglobin which was 9.13 weeks ago was down to 6.1 with an MCV of 101.7.  Given the presence of 2 cytopenias and presence of nucleated RBCs in her differential, she needs a bone marrow biopsy to rule out underlying MDS versus leukemia.  However this is a long weekend and she is unlikely to get a bone marrow biopsy until Tuesday, 04/30/2020.  Explained to the patient and her son Leah Small that it is not guaranteed that she would get a bone marrow biopsy as an inpatient on Tuesday either if there is no opening.    If she remains hemodynamically stable with no worsening thrombocytopenia, it may be okay to discharge the patient and I will arrange for a bone marrow biopsy as an outpatient hopefully next week.  Family understands that with her low platelet counts she will need 24-hour supervision to prevent any further falls.   Patient and her son understand that we could be dealing with a primary bone marrow disorder such as leukemia given her worsening cytopenias.  She is unlikely to be a candidate for any aggressive treatment if such a diagnosis is made from a bone marrow biopsy.  At this time I will repeat her  anemia work-up.  Check ferritin and iron studies B12 and folate, LDH reticulocyte count.  Will order pathology smear review.  Also check coags PT PTT INR and fibrinogen.    Okay to give her 1 unit of PRBC today.  Will obtain pathology smear review as well as peripheral flow cytometry.    Visit Diagnosis 1. Nonspecific chest pain   2. Anemia, unspecified type   3. Thrombocytopenia ([place])     Dr. Randa Evens, MD, MPH [facility] [phone] 04/26/2020 3:36 PM

## 2020-04-26 NOTE — ED Notes (Signed)
Pt c/o central and left sided chest pain that started this morning around 0930. Pt also having shortness of breath and 1 episode of vomiting. Pt was 1 given 324 mg of ASA in route. Pt is still c/o 8/10 chest pain.

## 2020-04-26 NOTE — ED Notes (Signed)
Pt was given 324 mg of ASA in route with EMS

## 2020-04-26 NOTE — ED Notes (Signed)
No longer on hold, beginning report att

## 2020-04-26 NOTE — Progress Notes (Signed)
ANTICOAGULATION CONSULT NOTE - Initial Consult  Pharmacy Consult for heparin Indication: chest pain/ACS  Allergies  Allergen Reactions  . Talwin [Pentazocine] Anaphylaxis and Swelling    Throat swelling  . Valium [Diazepam] Other (See Comments)    Makes her feel like she's flying  . Vicodin [Hydrocodone-Acetaminophen] Nausea And Vomiting  . Adhesive [Tape] Other (See Comments)    Bruising and TEARING!!!  Please use "paper" tape  . Beta Adrenergic Blockers Other (See Comments)    Pt states she not allergic  . Levofloxacin Swelling and Other (See Comments)    Tongue swells   . Simvastatin Swelling and Rash    Mouth swelling    Patient Measurements: Height: 5\' 6"  (167.6 cm) Weight: 56.7 kg (125 lb) IBW/kg (Calculated) : 59.3  HEPARIN DW (KG): 56.7   Vital Signs: BP: 121/62 (09/03 1129) Pulse Rate: 75 (09/03 1115)  Labs: No results for input(s): HGB, HCT, PLT, APTT, LABPROT, INR, HEPARINUNFRC, HEPRLOWMOCWT, CREATININE, CKTOTAL, CKMB, TROPONINIHS in the last 72 hours.  CrCl cannot be calculated (Patient's most recent lab result is older than the maximum 21 days allowed.).   Medical History: Past Medical History:  Diagnosis Date  . Acid reflux   . Arthritis    "all over"  . Chronic stable angina (HCC)   . Coronary artery disease   . Hypercholesterolemia   . Hypertension   . Myocardial infarction (H[facility]) 1991; 07/06/2015  . NSTEMI (non-ST elevated myocardial infarction) (H[facility]) 10/22/2015  . Psoriatic arthritis ([facility])   . 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 TIA (transient ischemic attack)    "several over the last 20 years" (10/22/2015)  . Type II diabetes mellitus [facility])     Assessment: 79 y.o. female presenting with chest pain s/p 324mg  ASA and found to have T wave abnormality on EKG. Pharmacy has been consulted for IV heparin dosing for r/o ACS.  Hgb and platelets low at 6.1 and 35, respectively. No  bleeding noted.   Goal of Therapy:  Heparin level 0.3-0.7 units/ml Monitor platelets by anticoagulation protocol: Yes  Plan:  Will hold heparin for now given bleed risk Will d/c pharmacy consult  MD please reconsult pharmacy if heparin infusion needed    96, PharmD Clinical Pharmacist  04/26/2020   12:00 PM

## 2020-04-26 NOTE — ED Notes (Signed)
Pt reports that she had syncopal episode yesterday. Pt hit her head, has small abrasion on right eye lid and bruising on the right cheek. Pt has bruising on the right elbow and wrist as well, with small abrasion to the elbow

## 2020-04-26 NOTE — ED Notes (Signed)
This RN went to lab to pick up second unit of blood to transfuse. Per blood bank they do have second unit of blood for patient, even though there are 2 orders to prepare and transfuse, Casimiro Needle in the blood bank checked there system and earlier blood bank staff had marked second order as duplicated order. This RN contacted Dr. Joylene Igo to clarify if pt was supposed to get 1 unit or 2 units of blood transfused. Per Dr. Joylene Igo, pt is to get 2 units of blood. This RN contacted Casimiro Needle in blood bank and he stated that he would get second unit ready for transfusion.

## 2020-04-26 NOTE — ED Notes (Signed)
Pt found to have small area of skin break down on her sacrum, Mepalex dressing put in place by this RN

## 2020-04-26 NOTE — ED Notes (Signed)
Cardiology PA at bedside. 

## 2020-04-26 NOTE — ED Notes (Signed)
Pt requesting something to drink, per EDP, pt should remain NPO until 2nd comes back. Pt given mouth swabs

## 2020-04-26 NOTE — ED Notes (Signed)
Pt c/o 9/10 pain in her left shoulder/left arm. EDP aware and EKG repeated

## 2020-04-26 NOTE — ED Notes (Signed)
Pt states that she is no longer having chest pain

## 2020-04-26 NOTE — ED Notes (Signed)
X-ray at bedside

## 2020-04-26 NOTE — ED Notes (Signed)
Pt son at bedside.

## 2020-04-26 NOTE — ED Notes (Signed)
Pt placed on zoll pads 

## 2020-04-26 NOTE — ED Triage Notes (Signed)
Pt to ED via [facility] from home for chief of CP that started this morning, reports pain all over to jaw and left arm . Extensive cardiac hx, CABG, stents, MI in May.  Reports SHOB, son reports normal. Reports N/V this morning.  Pt answering some questions appropriately but slow to respond.   CBG 424 per EMS.  324 ASA given PTA EMS 20g Left FA.

## 2020-04-26 NOTE — ED Provider Notes (Signed)
[facility] Emergency Department Provider Note   ____________________________________________   First MD Initiated Contact with Patient 04/26/20 1128     (approximate)  I have reviewed the triage vital signs and the nursing notes.   HISTORY  Chief Complaint Chest Pain    HPI Leah Small is a 79 y.o. female with possible history of CAD status post CABG, hypertension, hyperlipidemia, and seizure disorder who presents to the ED complaining of chest pain.  Patient states that she deals with pain in her chest most days but after she woke up this morning it was more severe than usual.  She describes it as a pressure that radiates to her left jaw as well as her left arm, has been easing up in severity since onset.  It was associated with some difficulty catching her breath earlier today, although her breathing also seems to have improved.  She denies any recent fevers or cough, has not had any vomiting or diarrhea.  Her son at the bedside states that she has a long history of cardiac issues and they have been told she has blockages in her coronary grafts that are not amenable to intervention.  EMS gave her 4 baby aspirin prior to arrival.        Past Medical History:  Diagnosis Date  . Acid reflux   . Arthritis    "all over"  . Chronic stable angina (HCC)   . Coronary artery disease   . Hypercholesterolemia   . Hypertension   . Myocardial infarction (HCC) 1991; 07/06/2015  . NSTEMI (non-ST elevated myocardial infarction) (HCC) 10/22/2015  . Psoriatic arthritis (HCC)   . 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 TIA (transient ischemic attack)    "several over the last 20 years" (10/22/2015)  . Type II diabetes mellitus [facility])     Patient Active Problem List   Diagnosis Date Noted  . Chest pain due to CAD (HCC) 04/26/2020  . Iron deficiency anemia 01/25/2020  . History of ETT   . Acute  respiratory failure with hypoxia (HCC)   . Palliative care by specialist   . Goals of care, counseling/discussion   . Pressure ulcer 12/30/2019  . STEMI (ST elevation myocardial infarction) (HCC) 12/28/2019  . Malnutrition of moderate degree 08/12/2017  . Hypotension   . Carotid stenosis 11/12/2016  . Hypertensive heart disease 01/29/2016  . Normocytic anemia 01/29/2016  . History of vaginal bleeding 01/29/2016  . GERD (gastroesophageal reflux disease) 01/29/2016  . NSTEMI (non-ST elevated myocardial infarction) (HCC) 10/22/2015  . Non-STEMI (non-ST elevated myocardial infarction) (HCC) 10/22/2015  . 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 Coronary atherosclerosis of native coronary artery 10/22/2015  . Angina pectoris (HCC)   . Memory deficit   . Stroke (HCC) 07/06/2015  . Diabetes mellitus with complication (HCC) 07/06/2015  . Essential hypertension 07/06/2015  . Hyperlipidemia 07/06/2015  . Left-sided weakness     Past Surgical History:  Procedure Laterality Date  . ABDOMINAL HYSTERECTOMY  1973  . APPENDECTOMY  1950s   elementary school  . CARDIAC CATHETERIZATION  X 2-3  . CARDIAC CATHETERIZATION N/A 11/21/2015   Procedure: Left Heart Cath and Coronary Angiography;  Surgeon: Corky Crafts, MD;  Location: [facility] INVASIVE CV LAB;  Service: Cardiovascular;  Laterality: N/A;  . CARDIAC CATHETERIZATION  11/21/2015   Procedure: Coronary Stent Intervention;  Surgeon: Corky Crafts, MD;  Location: [facility] INVASIVE CV LAB;  Service: Cardiovascular;;  . CARDIAC CATHETERIZATION N/A 01/29/2016   Procedure: Left Heart Cath and Cors/Grafts Angiography;  Surgeon: Tonny Bollman, MD;  Location: [facility] INVASIVE CV LAB;  Service: Cardiovascular;  Laterality: N/A;  . CARDIAC CATHETERIZATION N/A 07/22/2016   Procedure: Left Heart Cath and Coronary Angiography;  Surgeon: Iran Ouch, MD;  Location: [facility] INVASIVE CV LAB;  Service: Cardiovascular;  Laterality: N/A;  . CAROTID STENT  INSERTION Bilateral 2011-2012   right-left  . CATARACT EXTRACTION W/ INTRAOCULAR LENS  IMPLANT, BILATERAL Bilateral 2009-2010  . CHOLECYSTECTOMY OPEN  ~ 2014  . CORONARY ANGIOPLASTY    . CORONARY ANGIOPLASTY WITH STENT PLACEMENT  11/21/2015  . CORONARY ARTERY BYPASS GRAFT  08/1995  . CORONARY/GRAFT ACUTE MI REVASCULARIZATION N/A 12/28/2019   Procedure: Coronary/Graft Acute MI Revascularization;  Surgeon: Alwyn Pea, MD;  Location: [facility] INVASIVE CV LAB;  Service: Cardiovascular;  Laterality: N/A;  . DILATION AND CURETTAGE OF UTERUS  X 1  . LEFT HEART CATH AND CORONARY ANGIOGRAPHY N/A 12/28/2019   Procedure: LEFT HEART CATH AND CORONARY ANGIOGRAPHY;  Surgeon: Alwyn Pea, MD;  Location: [facility] INVASIVE CV LAB;  Service: Cardiovascular;  Laterality: N/A;  . 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 TONSILLECTOMY  ~ 1947   2nd grade    Prior to Admission medications   Medication Sig Start Date End Date Taking? Authorizing Provider  ascorbic acid (VITAMIN C) 500 MG tablet Take 1 tablet (500 mg total) by mouth daily. 01/04/20   Delfino Lovett, MD  aspirin EC 81 MG tablet Take 81 mg by mouth at bedtime.    [provider]  atorvastatin (LIPITOR) 40 MG tablet Take 40 mg by mouth daily. 12/04/19   [provider]  clopidogrel (PLAVIX) 75 MG tablet Take 1 tablet (75 mg total) by mouth every morning. Patient taking differently: Take 75 mg by mouth daily.  08/14/17   Enid Baas, MD  diltiazem (DILACOR XR) 240 MG 24 hr capsule Take 240 mg by mouth daily.    [provider]  gabapentin (NEURONTIN) 100 MG capsule Take 100-200 mg by mouth daily as needed.    [provider]  glipiZIDE (GLUCOTROL XL) 5 MG 24 hr tablet Take 5 mg by mouth daily with breakfast.    [provider]  guaiFENesin-dextromethorphan (ROBITUSSIN DM) 100-10 MG/5ML syrup Take 5 mLs every 4 (four) hours as needed by mouth for  cough. 06/29/17   Enedina Finner, MD  lamoTRIgine (LAMICTAL) 100 MG tablet Take 100 mg by mouth daily. 11/13/19   [provider]  levETIRAcetam (KEPPRA) 750 MG tablet Take 750 mg by mouth 2 (two) times daily.  11/28/19   [provider]  metFORMIN (GLUCOPHAGE) 1000 MG tablet Take 1 tablet (1,000 mg total) by mouth 2 (two) times daily. 01/30/16   Creig Hines, NP  nitroGLYCERIN (NITROSTAT) 0.4 MG SL tablet Place 0.4 mg under the tongue every 5 (five) minutes as needed for chest pain.    [provider]  nystatin cream (MYCOSTATIN) Apply 1 application topically 2 (two) times daily as needed (yeast infection).  04/25/15   [provider]  ramipril (ALTACE) 5 MG capsule Take 5 mg by mouth daily. 10/19/19   [provider]  ranolazine (RANEXA) 500 MG 12 hr tablet Take 1 tablet (500 mg total) by mouth 2 (two) times daily. 08/14/17 12/29/19  Enid Baas, MD  SIMBRINZA 1-0.2 % SUSP Place 1 drop into both eyes 2 (two) times  daily.  11/29/19   [provider]  timolol (TIMOPTIC) 0.5 % ophthalmic solution Place 1 drop into both eyes 2 (two) times daily. 06/14/17   [provider]  triamcinolone ointment (KENALOG) 0.1 % Apply 1 application topically 3 (three) times daily as needed (itching from psoriasis).    [provider]  vitamin B-12 (CYANOCOBALAMIN) 1000 MCG tablet Take 500 mcg by mouth every morning.     [provider]  vitamin E 400 UNIT capsule Take 400 Units by mouth every morning.     [provider]  zinc sulfate 220 (50 Zn) MG capsule Take 1 capsule (220 mg total) by mouth daily. 01/04/20   Delfino LovettShah, Vipul, MD    Allergies Talwin [pentazocine], Valium [diazepam], Vicodin [hydrocodone-acetaminophen], Adhesive [tape], Beta adrenergic blockers, Levofloxacin, and Simvastatin  Family History  Problem Relation Age of Onset  . Heart attack Father   . Diabetes Mother   . Cancer Mother   . Stroke Mother   .  Breast cancer Cousin   . Breast cancer Cousin   . Breast cancer Cousin     Social History Social History   Tobacco Use  . Smoking status: Never Smoker  . Smokeless tobacco: Never Used  Substance Use Topics  . Alcohol use: No  . Drug use: No    Review of Systems  Constitutional: No fever/chills Eyes: No visual changes. ENT: No sore throat. Cardiovascular: Positive for chest pain. Respiratory: Denies shortness of breath. Gastrointestinal: No abdominal pain.  No nausea, no vomiting.  No diarrhea.  No constipation. Genitourinary: Negative for dysuria. Musculoskeletal: Negative for back pain. Skin: Negative for rash. Neurological: Negative for headaches, focal weakness or numbness.  ____________________________________________   PHYSICAL EXAM:  VITAL SIGNS: ED Triage Vitals  Enc Vitals Group     BP 04/26/20 1129 121/62     Pulse Rate 04/26/20 1115 75     Resp 04/26/20 1115 16     Temp 04/26/20 1217 (!) 96.7 F (35.9 C)     Temp Source 04/26/20 1217 Rectal     SpO2 04/26/20 1143 100 %     Weight 04/26/20 1116 125 lb (56.7 kg)     Height 04/26/20 1116 5\' 6"  (1.676 m)     Head Circumference --      Peak Flow --      Pain Score 04/26/20 1116 9     Pain Loc --      Pain Edu? --      Excl. in GC? --     Constitutional: Alert and oriented.  Pale appearing. Eyes: Conjunctivae are normal. Head: Atraumatic. Nose: No congestion/rhinnorhea. Mouth/Throat: Mucous membranes are moist. Neck: Normal ROM Cardiovascular: Normal rate, regular rhythm. Grossly normal heart sounds. Respiratory: Mildly tachypneic with normal respiratory effort.  No retractions. Lungs CTAB. Gastrointestinal: Soft and nontender. No distention.  Dark brown guaiac-negative stool on rectal exam. Genitourinary: deferred Musculoskeletal: No lower extremity tenderness nor edema. Neurologic:  Normal speech and language. No gross focal neurologic deficits are appreciated. Skin:  Skin is warm, dry and  intact. No rash noted. Psychiatric: Mood and affect are normal. Speech and behavior are normal.  ____________________________________________   LABS (all labs ordered are listed, but only abnormal results are displayed)  Labs Reviewed  CBC WITH DIFFERENTIAL/PLATELET - Abnormal; Notable for the following components:      Result Value   RBC 1.72 (*)    Hemoglobin 6.1 (*)    HCT 17.5 (*)    MCV 101.7 (*)  MCH 35.5 (*)    RDW 17.4 (*)    Platelets 35 (*)    nRBC 2.1 (*)    Abs Immature Granulocytes 0.09 (*)    All other components within normal limits  BASIC METABOLIC PANEL - Abnormal; Notable for the following components:   Sodium 129 (*)    CO2 14 (*)    Glucose, Bld 294 (*)    BUN 48 (*)    Creatinine, Ser 1.42 (*)    GFR calc non Af Amer 35 (*)    GFR calc Af Amer 40 (*)    All other components within normal limits  TROPONIN I (HIGH SENSITIVITY) - Abnormal; Notable for the following components:   Troponin I (High Sensitivity) 813 (*)    All other components within normal limits  SARS CORONAVIRUS 2 BY RT PCR (HOSPITAL ORDER, PERFORMED IN [facility])  TYPE AND SCREEN  PREPARE RBC (CROSSMATCH)  TROPONIN I (HIGH SENSITIVITY)   ____________________________________________  EKG  ED ECG REPORT I, Chesley Noon, the attending physician, personally viewed and interpreted this ECG.   Date: 04/26/2020  EKG Time: 11:12  Rate: 80  Rhythm: normal sinus rhythm  Axis: Normal  Intervals:nonspecific intraventricular conduction delay  ST&T Change: ST elevation in aVR, ST depressions inferiorly and laterally   ED ECG REPORT I, Chesley Noon, the attending physician, personally viewed and interpreted this ECG.   Date: 04/26/2020  EKG Time: 11:42  Rate: 82  Rhythm: normal sinus rhythm  Axis: Normal  Intervals:nonspecific intraventricular conduction delay  ST&T Change: ST elevation in aVR, ST depressions inferolaterally   PROCEDURES  Procedure(s)  performed (including Critical Care):  .Critical Care Performed by: Chesley Noon, MD Authorized by: Chesley Noon, MD   Critical care provider statement:    Critical care time (minutes):  45   Critical care time was exclusive of:  Separately billable procedures and treating other patients and teaching time   Critical care was necessary to treat or prevent imminent or life-threatening deterioration of the following conditions:  Cardiac failure and circulatory failure   Critical care was time spent personally by me on the following activities:  Discussions with consultants, evaluation of patient's response to treatment, examination of patient, ordering and performing treatments and interventions, ordering and review of laboratory studies, ordering and review of radiographic studies, pulse oximetry, re-evaluation of patient's condition, obtaining history from patient or surrogate and review of old charts   I assumed direction of critical care for this patient from another provider in my specialty: no       ____________________________________________   INITIAL IMPRESSION / ASSESSMENT AND PLAN / ED COURSE       79 year old female with past medical history of CAD status post CABG, hypertension, hyperlipidemia, and seizure disorder who presents to the ED complaining of worsening chest pain on top of her chronic chest pain starting this morning.  Description of symptoms is concerning for ACS and initial EKG shows ST elevation in aVR with associated ST depressions inferiorly and laterally.  Code STEMI was called and given cardiology here at [facility] he is currently in the Cath Lab with another patient, I discussed the case with Dr. Excell Seltzer at [facility].  He states that while EKG appearance is concerning for ischemia, EKG does not meet STEMI criteria at this time.  Patient also has a known history of occluded bypass grafts that are not amenable to intervention on recent catheterization.  Dr. Excell Seltzer  recommends observation and trending of troponins, plan to  initiate heparin given concerning history as well as EKG.  Lab work is remarkable for low hemoglobin as well as low platelets.  She has a hemoglobin of 6.1 but no evidence of acute bleeding on rectal exam.  We will transfuse 1 unit PRBCs and hold off on heparin given this finding.  Labs also remarkable for mild AKI as well as elevated troponin.  Troponin is markedly elevated but also similar to prior.  Patient did have brief recurrence of chest pain with repeat EKG stable from prior and pain improving with nitroglycerin.  Cardiology PA now at bedside to evaluate the patient.  Case discussed with hospitalist for admission.      ____________________________________________   FINAL CLINICAL IMPRESSION(S) / ED DIAGNOSES  Final diagnoses:  Nonspecific chest pain  Anemia, unspecified type  Thrombocytopenia [facility])     ED Discharge Orders    None       Note:  This document was prepared using Dragon voice recognition software and may include unintentional dictation errors.   Chesley Noon, MD 04/26/20 1345

## 2020-04-26 NOTE — H&P (Addendum)
History and Physical    ARRIONA PREST ZOX:[id number] DOB: February 13, 1941 DOA: 04/26/2020  PCP: Marguarite Arbour, MD   Patient coming from: Home  I have personally briefly reviewed patient's old medical records in [facility]  Chief Complaint: Chest pain  HPI: Leah Small is a 79 y.o. female with medical history significant for coronary artery disease status post CABG, status post stent angioplasty, history of hypertension, diabetes mellitus, dyslipidemia and seizure disorder who presented to the ED via EMS for evaluation of chest pain.  Patient states that she has frequent anginal episodes for which she takes nitroglycerin but this morning she developed chest pain at rest which she rated 10 x 10 in intensity at its worst.  Pain radiated to her left jaw and left arm and was associated with nausea and vomiting.  She denies having any diaphoresis, shortness of breath or palpitations. Patient states that she had a syncopal episode the day prior to her admission.  She noted that her blood sugar was low in the 70s and was making breakfast when she states that she blacked out.  She has a bruise over the right arm. Labs reveal sodium of 129, potassium 5.1, chloride 100, bicarb 14, glucose 94, BUN 48, creatinine 1.14, troponin 813, iron 177, white count 4.9, hemoglobin 6.1 >>9.1 and, hematocrit 17.5, platelet count 35 >> 115 Chest x-ray reviewed by me shows no acute findings EKG shows repolarization abnormality consistent with global ischemia   ED Course: Patient is an 79 year old Caucasian female with past medical history significant for coronary artery disease status post CABG status post stent angioplasty who presents to the ER for evaluation of chest pain.  She has significant EKG changes, ST elevation.  ER provider discussed with cardiology consult a code STEMI was not called because patient has inoperable coronary artery disease and medical management was recommended.  She has anemia with a  hemoglobin of 6.1g/dl and stools are heme negative.  Patient was started on heparin due to thrombocytopenia.  She will be admitted to the hospital for further evaluation.  Review of Systems: As per HPI otherwise 10 point review of systems negative.    Past Medical History:  Diagnosis Date  . Acid reflux   . Arthritis    "all over"  . Chronic stable angina (HCC)   . Coronary artery disease   . Hypercholesterolemia   . Hypertension   . Myocardial infarction (HCC) 1991; 07/06/2015  . NSTEMI (non-ST elevated myocardial infarction) (HCC) 10/22/2015  . Psoriatic arthritis (HCC)   . 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 TIA (transient ischemic attack)    "several over the last 20 years" (10/22/2015)  . Type II diabetes mellitus (HCC)     Past Surgical History:  Procedure Laterality Date  . ABDOMINAL HYSTERECTOMY  1973  . APPENDECTOMY  1950s   elementary school  . CARDIAC CATHETERIZATION  X 2-3  . CARDIAC CATHETERIZATION N/A 11/21/2015   Procedure: Left Heart Cath and Coronary Angiography;  Surgeon: Corky Crafts, MD;  [facility] IN[facility] CV LAB;  Service: Cardiovascular;  Laterality: N/A;  . CARDIAC CATHETERIZATION  11/21/2015   Procedure: Coronary Stent Intervention;  Surgeon: Corky Crafts, MD;  Location: [facility] INVASIVE CV LAB;  Service: Cardiovascular;;  . CARDIAC CATHETERIZATION N/A 01/29/2016   Procedure: Left Heart Cath and Cors/Grafts Angiography;  Surgeon: Tonny Bollman, MD;  Location: [facility] INVASIVE CV LAB;  Service: Cardiovascular;  Laterality:  N/A;  . CARDIAC CATHETERIZATION N/A 07/22/2016   Procedure: Left Heart Cath and Coronary Angiography;  Surgeon: Iran OuchMuhammad A Arida, MD;  Location: [facility] INVASIVE CV LAB;  Service: Cardiovascular;  Laterality: N/A;  . CAROTID STENT INSERTION Bilateral 2011-2012   right-left  . CATARACT EXTRACTION W/ INTRAOCULAR LENS  IMPLANT, BILATERAL Bilateral 2009-2010  . CHOLECYSTECTOMY  OPEN  ~ 2014  . CORONARY ANGIOPLASTY    . CORONARY ANGIOPLASTY WITH STENT PLACEMENT  11/21/2015  . CORONARY ARTERY BYPASS GRAFT  08/1995  . CORONARY/GRAFT ACUTE MI REVASCULARIZATION N/A 12/28/2019   Procedure: Coronary/Graft Acute MI Revascularization;  Surgeon: Alwyn Peaallwood, Dwayne D, MD;  Location: [facility] INVASIVE CV LAB;  Service: Cardiovascular;  Laterality: N/A;  . DILATION AND CURETTAGE OF UTERUS  X 1  . LEFT HEART CATH AND CORONARY ANGIOGRAPHY N/A 12/28/2019   Procedure: LEFT HEART CATH AND CORONARY ANGIOGRAPHY;  Surgeon: Alwyn Peaallwood, Dwayne D, MD;  Location: [facility] INVASIVE CV LAB;  Service: Cardiovascular;  Laterality: N/A;  . 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 TONSILLECTOMY  ~ 1947   2nd grade     reports that she has never smoked. She has never used smokeless tobacco. She reports that she does not drink alcohol and does not use drugs.  Allergies  Allergen Reactions  . Talwin [Pentazocine] Anaphylaxis and Swelling    Throat swelling  . Valium [Diazepam] Other (See Comments)    Makes her feel like she's flying  . Vicodin [Hydrocodone-Acetaminophen] Nausea And Vomiting  . Adhesive [Tape] Other (See Comments)    Bruising and TEARING!!!  Please use "paper" tape  . Beta Adrenergic Blockers Other (See Comments)    Pt states she not allergic  . Levofloxacin Swelling and Other (See Comments)    Tongue swells   . Simvastatin Swelling and Rash    Mouth swelling    Family History  Problem Relation Age of Onset  . Heart attack Father   . Diabetes Mother   . Cancer Mother   . Stroke Mother   . Breast cancer Cousin   . Breast cancer Cousin   . Breast cancer Cousin      Prior to Admission medications   Medication Sig Start Date End Date Taking? Authorizing Provider  ascorbic acid (VITAMIN C) 500 MG tablet Take 1 tablet (500 mg total) by mouth daily. 01/04/20   Delfino LovettShah, Vipul, MD  aspirin EC 81 MG tablet Take 81 mg by mouth at bedtime.     [provider]  atorvastatin (LIPITOR) 40 MG tablet Take 40 mg by mouth daily. 12/04/19   [provider]  clopidogrel (PLAVIX) 75 MG tablet Take 1 tablet (75 mg total) by mouth every morning. Patient taking differently: Take 75 mg by mouth daily.  08/14/17   Enid BaasKalisetti, Radhika, MD  diltiazem (CARDIZEM CD) 240 MG 24 hr capsule Take 240 mg by mouth daily. 02/12/20   [provider]  folic acid (FOLVITE) 1 MG tablet Take 1 mg by mouth daily. 03/28/20   [provider]  gabapentin (NEURONTIN) 100 MG capsule Take 100-200 mg by mouth daily as needed.    [provider]  glipiZIDE (GLUCOTROL XL) 5 MG 24 hr tablet Take 5 mg by mouth daily with breakfast.    [provider]  lamoTRIgine (LAMICTAL) 100 MG tablet Take 100 mg by mouth daily. 11/13/19   [provider]  levETIRAcetam (KEPPRA) 500 MG tablet Take 500 mg by mouth 2 (two) times daily. 02/29/20  [provider]  metFORMIN (GLUCOPHAGE) 1000 MG tablet Take 1 tablet (1,000 mg total) by mouth 2 (two) times daily. 01/30/16   Creig Hines, NP  nitroGLYCERIN (NITROSTAT) 0.4 MG SL tablet Place 0.4 mg under the tongue every 5 (five) minutes as needed for chest pain.    [provider]  nystatin cream (MYCOSTATIN) Apply 1 application topically 2 (two) times daily as needed (yeast infection).  04/25/15   [provider]  ramipril (ALTACE) 5 MG capsule Take 5 mg by mouth daily. 10/19/19   [provider]  ranolazine (RANEXA) 500 MG 12 hr tablet Take 1 tablet (500 mg total) by mouth 2 (two) times daily. 08/14/17 12/29/19  Enid Baas, MD  SIMBRINZA 1-0.2 % SUSP Place 1 drop into both eyes 2 (two) times daily.  11/29/19   [provider]  timolol (TIMOPTIC) 0.5 % ophthalmic solution Place 1 drop into both eyes 2 (two) times daily. 06/14/17   [provider]  triamcinolone ointment (KENALOG) 0.1 % Apply 1 application topically 3 (three)  times daily as needed (itching from psoriasis).    [provider]  vitamin B-12 (CYANOCOBALAMIN) 1000 MCG tablet Take 500 mcg by mouth every morning.     [provider]  vitamin E 400 UNIT capsule Take 400 Units by mouth every morning.     [provider]  zinc sulfate 220 (50 Zn) MG capsule Take 1 capsule (220 mg total) by mouth daily. 01/04/20   Delfino Lovett, MD    Physical Exam: Vitals:   04/26/20 1315 04/26/20 1324 04/26/20 1330 04/26/20 1345  BP: (!) 119/105 137/78 (!) 142/77 137/77  Pulse:  78 80   Resp: 18 20 17 16   Temp:      TempSrc:      SpO2:  100% 100%   Weight:      Height:         Vitals:   04/26/20 1315 04/26/20 1324 04/26/20 1330 04/26/20 1345  BP: (!) 119/105 137/78 (!) 142/77 137/77  Pulse:  78 80   Resp: 18 20 17 16   Temp:      TempSrc:      SpO2:  100% 100%   Weight:      Height:        Constitutional: NAD, alert and oriented x 3.  Chronically ill-appearing.  Generalized pallor Eyes: PERRL, lids and conjunctivae pallor ENMT: Mucous membranes are moist.  Neck: normal, supple, no masses, no thyromegaly Respiratory: clear to auscultation bilaterally, no wheezing, no crackles. Normal respiratory effort. No accessory muscle use.  Cardiovascular: Regular rate and rhythm, no murmurs / rubs / gallops. No extremity edema. 2+ pedal pulses. No carotid bruits.  Abdomen: no tenderness, no masses palpated. No hepatosplenomegaly. Bowel sounds positive.  Musculoskeletal: no clubbing / cyanosis. No joint deformity upper and lower extremities.  Skin: Ecchymosis over right elbow, lesions, ulcers.  Neurologic: No gross focal neurologic deficit. Psychiatric: Normal mood and affect.   Labs on Admission: I have personally reviewed following labs and imaging studies  CBC: Recent Labs  Lab 04/26/20 1134  WBC 4.9  NEUTROABS 2.9  HGB 6.1*  HCT 17.5*  MCV 101.7*  PLT 35*   Basic Metabolic Panel: Recent Labs  Lab 04/26/20 1134  NA 129*   K 5.1  CL 100  CO2 14*  GLUCOSE 294*  BUN 48*  CREATININE 1.42*  CALCIUM 8.9   GFR: Estimated Creatinine Clearance: 28.3 mL/min (A) (by C-G formula based on SCr of 1.42 mg/dL (  H)). Liver Function Tests: No results for input(s): AST, ALT, ALKPHOS, BILITOT, PROT, ALBUMIN in the last 168 hours. No results for input(s): LIPASE, AMYLASE in the last 168 hours. No results for input(s): AMMONIA in the last 168 hours. Coagulation Profile: No results for input(s): INR, PROTIME in the last 168 hours. Cardiac Enzymes: No results for input(s): CKTOTAL, CKMB, CKMBINDEX, TROPONINI in the last 168 hours. BNP (last 3 results) No results for input(s): PROBNP in the last 8760 hours. HbA1C: No results for input(s): HGBA1C in the last 72 hours. CBG: No results for input(s): GLUCAP in the last 168 hours. Lipid Profile: No results for input(s): CHOL, HDL, LDLCALC, TRIG, CHOLHDL, LDLDIRECT in the last 72 hours. Thyroid Function Tests: No results for input(s): TSH, T4TOTAL, FREET4, T3FREE, THYROIDAB in the last 72 hours. Anemia Panel: No results for input(s): VITAMINB12, FOLATE, FERRITIN, TIBC, IRON, RETICCTPCT in the last 72 hours. Urine analysis:    Component Value Date/Time   COLORURINE COLORLESS (A) 12/28/2019 1818   APPEARANCEUR CLEAR (A) 12/28/2019 1818   LABSPEC 1.014 12/28/2019 1818   PHURINE 6.0 12/28/2019 1818   GLUCOSEU 50 (A) 12/28/2019 1818   HGBUR NEGATIVE 12/28/2019 1818   BILIRUBINUR NEGATIVE 12/28/2019 1818   KETONESUR NEGATIVE 12/28/2019 1818   PROTEINUR NEGATIVE 12/28/2019 1818   UROBILINOGEN 0.2 07/06/2015 1558   NITRITE NEGATIVE 12/28/2019 1818   LEUKOCYTESUR NEGATIVE 12/28/2019 1818    Radiological Exams on Admission: DG Chest Portable 1 View  Result Date: 04/26/2020 CLINICAL DATA:  Chest pain that started this morning pain all over to LEFT jaw and LEFT arm EXAM: PORTABLE CHEST 1 VIEW COMPARISON:  Dec 29, 2019 FINDINGS: Pacer defibrillator pad overlies the central chest  and LEFT lower chest. Trachea midline. Heart size remains enlarged post median sternotomy for CABG. Hilar structures are stable. Lungs are clear.  No sign of pleural effusion. No acute skeletal process to the extent evaluated. IMPRESSION: No effusion or consolidation. Stable findings of postoperative changes related to median sternotomy and CABG. Electronically Signed   By: Donzetta Kohut M.D.   On: 04/26/2020 11:53    EKG: Independently reviewed.  Repolarization changes related to global ischemia  Assessment/Plan Principal Problem:   Chest pain due to CAD [facility]) Active Problems:   Diabetes mellitus with complication (HCC)   Essential hypertension   Complex partial seizure disorder (HCC)   Anemia   Thrombocytopenia (HCC)   Chest pain due to coronary artery disease Patient has known inoperable coronary artery disease presented to the ER for evaluation of midsternal chest pain with radiation to her jaw and left arm. She had EKG changes suggestive of global ischemia and troponin is elevated Chest pain may also be secondary to severe anemia Patient is not on heparin drip due to thrombocytopenia and anemia requiring blood transfusion Continue aspirin, nitrates, statins and Ranexa Patient not on beta-blockers due to allergy We will request cardiology consult Obtain 2D echocardiogram to rule out regional wall motion abnormality   Anemia/thrombocytopenia Unclear etiology At baseline patient has a hemoglobin of 9.1 and today on admission it is 6.1. Stool is heme-negative She also has thrombocytopenia with platelet count of 35,000 compared to baseline of 115,000 ??  Hemolysis We will obtain LDH and a blood smear to rule out schistocytes We will transfuse patient 2 units of packed RBC We will request hematology consult   Diabetes mellitus Maintain consistent carbohydrate diet Glycemic control with sliding scale insulin Hold oral hypoglycemic agents   Essential hypertension Blood  pressure is stable on  diltiazem and ramipril   Seizure disorder Place patient on seizure precautions Continue Keppra and Lamictal    DVT prophylaxis: SCD Code Status: Full code Family Communication: Greater than 50% of time was spent discussing plan of care with patient and her son, Kailynn Satterly who is her healthcare power of attorney and was at the bedside.  All questions and concerns have been addressed.  They verbalized understanding and agree with the plan. Disposition Plan: Back to previous home environment Consults called: Cardiology/oncology    Lucile Shutters MD [facility]     04/26/2020, 2:48 PM

## 2020-04-26 NOTE — ED Notes (Signed)
EDP at bedside  

## 2020-04-26 NOTE — ED Notes (Signed)
Pt had 1 episode of vomiting this morning

## 2020-04-26 NOTE — ED Notes (Addendum)
First call report att Trey Paula, sec reports staff in report, floor charge RN wants me to stay on hold - April, ED charge notified at 1937  Delayed explained to pt  Olegario Messier, [employer], placed transport request

## 2020-04-26 NOTE — ED Notes (Signed)
Admitting MD at bedside.

## 2020-04-26 NOTE — Progress Notes (Signed)
Ch arrived at room in response to Code STEMI. Care team working on Pt. Ch met with Pt's son. Son recognized Ch from Pt's previous ED admission and Cath Lab support. Pt's son was explaining to Ch about Pt's chest pains. Code STEMI called off after a while. Ch will follow up with Pt as needed.

## 2020-04-27 ENCOUNTER — Encounter: Payer: Self-pay | Admitting: Internal Medicine

## 2020-04-27 DIAGNOSIS — I10 Essential (primary) hypertension: Secondary | ICD-10-CM

## 2020-04-27 LAB — BASIC METABOLIC PANEL
Anion gap: 10 (ref 5–15)
BUN: 38 mg/dL — ABNORMAL HIGH (ref 8–23)
CO2: 16 mmol/L — ABNORMAL LOW (ref 22–32)
Calcium: 8.6 mg/dL — ABNORMAL LOW (ref 8.9–10.3)
Chloride: 104 mmol/L (ref 98–111)
Creatinine, Ser: 1.05 mg/dL — ABNORMAL HIGH (ref 0.44–1.00)
GFR calc Af Amer: 58 mL/min — ABNORMAL LOW (ref >60)
GFR calc non Af Amer: 50 mL/min — ABNORMAL LOW (ref >60)
Glucose, Bld: 166 mg/dL — ABNORMAL HIGH (ref 70–99)
Potassium: 4.5 mmol/L (ref 3.5–5.1)
Sodium: 130 mmol/L — ABNORMAL LOW (ref 135–145)

## 2020-04-27 LAB — CBC
HCT: 28.1 % — ABNORMAL LOW (ref 36.0–46.0)
Hemoglobin: 10 g/dL — ABNORMAL LOW (ref 12.0–15.0)
MCH: 33.2 pg (ref 26.0–34.0)
MCHC: 35.6 g/dL (ref 30.0–36.0)
MCV: 93.4 fL (ref 80.0–100.0)
Platelets: 24 10*3/uL — CL (ref 150–400)
RBC: 3.01 MIL/uL — ABNORMAL LOW (ref 3.87–5.11)
RDW: 16.6 % — ABNORMAL HIGH (ref 11.5–15.5)
WBC: 3.5 10*3/uL — ABNORMAL LOW (ref 4.0–10.5)
nRBC: 5.1 % — ABNORMAL HIGH (ref 0.0–0.2)

## 2020-04-27 LAB — GLUCOSE, CAPILLARY
Glucose-Capillary: 178 mg/dL — ABNORMAL HIGH (ref 70–99)
Glucose-Capillary: 195 mg/dL — ABNORMAL HIGH (ref 70–99)
Glucose-Capillary: 191 mg/dL — ABNORMAL HIGH (ref 70–99)
Glucose-Capillary: 192 mg/dL — ABNORMAL HIGH (ref 70–99)

## 2020-04-27 LAB — TROPONIN I (HIGH SENSITIVITY)
Troponin I (High Sensitivity): 15900 ng/L (ref <18)
Troponin I (High Sensitivity): 13617 ng/L (ref <18)

## 2020-04-27 MED ORDER — ORAL CARE MOUTH RINSE
15.0000 mL | Freq: Two times a day (BID) | OROMUCOSAL | Status: DC
  Administered 2020-04-27 – 2020-05-08 (×18): 15 mL via OROMUCOSAL

## 2020-04-27 MED ORDER — RANOLAZINE ER 500 MG PO TB12
1000.0000 mg | ORAL_TABLET | Freq: Two times a day (BID) | ORAL | Status: DC
  Administered 2020-04-27 – 2020-05-08 (×22): 1000 mg via ORAL
  Filled 2020-04-27 (×25): qty 2

## 2020-04-27 MED ORDER — DILTIAZEM HCL ER COATED BEADS 120 MG PO CP24
120.0000 mg | ORAL_CAPSULE | Freq: Every day | ORAL | Status: DC
  Administered 2020-04-27: 120 mg via ORAL

## 2020-04-27 MED ORDER — NON FORMULARY: 1.0000 [drp] | Freq: Two times a day (BID) | Status: DC

## 2020-04-27 MED ORDER — BRIMONIDINE TARTRATE 0.2 % OP SOLN
1.0000 [drp] | Freq: Two times a day (BID) | OPHTHALMIC | Status: DC
  Administered 2020-04-27 – 2020-05-08 (×22): 1 [drp] via OPHTHALMIC
  Filled 2020-04-27 (×2): qty 5

## 2020-04-27 MED ORDER — BRINZOLAMIDE 1 % OP SUSP
1.0000 [drp] | Freq: Two times a day (BID) | OPHTHALMIC | Status: DC
  Administered 2020-04-27 – 2020-05-08 (×23): 1 [drp] via OPHTHALMIC
  Filled 2020-04-27 (×2): qty 10

## 2020-04-27 NOTE — Progress Notes (Signed)
   04/27/20 2340  Assess: MEWS Score  Temp 97.7 F (36.5 C)  BP (!) 68/41  Pulse Rate 64  SpO2 100 %  O2 Device Nasal Cannula  O2 Flow Rate (L/min) 2 L/min  Assess: MEWS Score  MEWS Temp 0  MEWS Systolic 3  MEWS Pulse 0  MEWS RR 0  MEWS LOC 0  MEWS Score 3  MEWS Score Color Yellow   Provider at bedside @ this time

## 2020-04-27 NOTE — Progress Notes (Addendum)
Progress Note    GAZELLE TOWE  WNI:[id number] DOB: 1940/12/25  DOA: 04/26/2020 PCP: Idelle Crouch, MD      Brief Narrative:    Medical records reviewed and are as summarized below:  Maeola Sarah is a 79 y.o. female       Assessment/Plan:   Principal Problem:   Non-STEMI (non-ST elevated myocardial infarction) ([place]) Active Problems:   Diabetes mellitus with complication ([place])   Essential hypertension   Complex partial seizure disorder ([place])   Chest pain due to CAD ([place])   Anemia   Severe thrombocytopenia ([place])    Acute NSTEMI, multivessel CAD with CABG Severe anemia Severe thrombocytopenia Type II DM Hypertension Seizure disorder Elevated vitamin B12 level (934) History of stroke  PLAN  Troponin peaked at 15,900  Hemoglobin improved s/p transfusion with 2 units of PRBCs on 04/26/2020 Plavix was held on admission.  She prefers to stay off of aspirin as well because of significant thrombocytopenia.  She said she has been dealing with CAD for years and she has learned to live with that.  She also understands risks of not having antiplatelets on board in the setting of acute MI.  She says she is making her own decisions.  Platelet count is trending down.  Continue to monitor.  Continue antihypertensives  Continue antiepileptics  Patient will likely need bone marrow biopsy as anoutpatient.  This will be scheduled by the hematologist.  Body mass index is 18.45 kg/m.  Diet Order            Diet Carb Modified Fluid consistency: Thin; Room service appropriate? Yes  Diet effective now                    Consultants:  Cardiologist  Hematologist  Procedures:  None    Medications:   . sodium chloride   Intravenous Once  . atorvastatin  40 mg Oral Daily  . brinzolamide  1 drop Both Eyes BID   And  . brimonidine  1 drop Both Eyes BID  . diltiazem  120 mg Oral Daily  . folic acid  1 mg Oral Daily  . insulin aspart  0-15 Units  Subcutaneous TID WC  . lamoTRIgine  100 mg Oral Daily  . levETIRAcetam  500 mg Oral BID  . metoprolol succinate  25 mg Oral Daily  . ramipril  5 mg Oral Daily  . ranolazine  1,000 mg Oral BID  . sodium chloride flush  3 mL Intravenous Q12H  . timolol  1 drop Both Eyes BID  . vitamin B-12  500 mcg Oral BH-q7a  . vitamin E  400 Units Oral BH-q7a   Continuous Infusions: . sodium chloride    . sodium chloride       Anti-infectives (From admission, onward)   None             Family Communication/Anticipated D/C date and plan/Code Status   DVT prophylaxis: SCDs Start: 04/26/20 1428     Code Status: Full Code  Family Communication: Plan discussed with her son at the bedside Disposition Plan:    Status is: Inpatient  Remains inpatient appropriate because:Inpatient level of care appropriate due to severity of illness   Dispo: The patient is from: Home              Anticipated d/c is to: Home              Anticipated d/c date is: 1 day  Patient currently is not medically stable to d/c.           Subjective:   She complains of mild chest pain about 2/10 in severity.  She said "I always have angina".  No shortness of breath, dizziness or palpitations.  Objective:    Vitals:   04/27/20 0330 04/27/20 0400 04/27/20 0453 04/27/20 1144  BP: 132/73 136/75 116/67 114/67  Pulse: 88 89 86 80  Resp:      Temp:  97.7 F (36.5 C) 97.9 F (36.6 C) 97.8 F (36.6 C)  TempSrc:  Oral Oral Oral  SpO2: 99% 100% 99% 100%  Weight:   51.8 kg   Height:       No data found.   Intake/Output Summary (Last 24 hours) at 04/27/2020 1640 Last data filed at 04/27/2020 0400 Gross per 24 hour  Intake 672 ml  Output --  Net 672 ml   Filed Weights   04/26/20 1116 04/27/20 0453  Weight: 56.7 kg 51.8 kg    Exam:  GEN: NAD SKIN: No rash EYES: EOMI ENT: MMM CV: RRR PULM: CTA B ABD: soft, ND, NT, +BS CNS: AAO x 3, non focal EXT: No edema or  tenderness   Data Reviewed:   I have personally reviewed following labs and imaging studies:  Labs: Labs show the following:   Basic Metabolic Panel: Recent Labs  Lab 04/26/20 1134 04/27/20 0450  NA 129* 130*  K 5.1 4.5  CL 100 104  CO2 14* 16*  GLUCOSE 294* 166*  BUN 48* 38*  CREATININE 1.42* 1.05*  CALCIUM 8.9 8.6*   GFR Estimated Creatinine Clearance: 34.9 mL/min (A) (by C-G formula based on SCr of 1.05 mg/dL (H)). Liver Function Tests: No results for input(s): AST, ALT, ALKPHOS, BILITOT, PROT, ALBUMIN in the last 168 hours. No results for input(s): LIPASE, AMYLASE in the last 168 hours. No results for input(s): AMMONIA in the last 168 hours. Coagulation profile Recent Labs  Lab 04/26/20 1525  INR 1.1    CBC: Recent Labs  Lab 04/26/20 1134 04/27/20 0450  WBC 4.9 3.5*  NEUTROABS 2.9  --   HGB 6.1* 10.0*  HCT 17.5* 28.1*  MCV 101.7* 93.4  PLT 35* 24*   Cardiac Enzymes: No results for input(s): CKTOTAL, CKMB, CKMBINDEX, TROPONINI in the last 168 hours. BNP (last 3 results) No results for input(s): PROBNP in the last 8760 hours. CBG: Recent Labs  Lab 04/26/20 1724 04/27/20 1004 04/27/20 1145  GLUCAP 162* 178* 195*   D-Dimer: No results for input(s): DDIMER in the last 72 hours. Hgb A1c: Recent Labs    04/26/20 1525  HGBA1C 6.8*   Lipid Profile: No results for input(s): CHOL, HDL, LDLCALC, TRIG, CHOLHDL, LDLDIRECT in the last 72 hours. Thyroid function studies: No results for input(s): TSH, T4TOTAL, T3FREE, THYROIDAB in the last 72 hours.  Invalid input(s): FREET3 Anemia work up: Recent Labs    04/26/20 1525  VITAMINB12 934*  FOLATE 105.0  FERRITIN 5,988*  TIBC 335  IRON 160  RETICCTPCT 1.6   Sepsis Labs: Recent Labs  Lab 04/26/20 1134 04/27/20 0450  WBC 4.9 3.5*    Microbiology Recent Results (from the past 240 hour(s))  SARS Coronavirus 2 by RT PCR (hospital order, performed in [facility] hospital lab) Nasopharyngeal  Nasopharyngeal Swab     Status: None   Collection Time: 04/26/20 11:34 AM   Specimen: Nasopharyngeal Swab  Result Value Ref Range Status   SARS Coronavirus 2 NEGATIVE NEGATIVE Final  Comment: (NOTE) SARS-CoV-2 target nucleic acids are NOT DETECTED.  The SARS-CoV-2 RNA is generally detectable in upper and lower respiratory specimens during the acute phase of infection. The lowest concentration of SARS-CoV-2 viral copies this assay can detect is 250 copies / mL. A negative result does not preclude SARS-CoV-2 infection and should not be used as the sole basis for treatment or other patient management decisions.  A negative result may occur with improper specimen collection / handling, submission of specimen other than nasopharyngeal swab, presence of viral mutation(s) within the areas targeted by this assay, and inadequate number of viral copies (<250 copies / mL). A negative result must be combined with clinical observations, patient history, and epidemiological information.  Fact Sheet for Patients:   StrictlyIdeas.no  Fact Sheet for Healthcare Providers: BankingDealers.co.za  This test is not yet approved or  cleared by the [place] FDA and has been authorized for detection and/or diagnosis of SARS-CoV-2 by FDA under an Emergency Use Authorization (EUA).  This EUA will remain in effect (meaning this test can be used) for the duration of the COVID-19 declaration under Section 564(b)(1) of the Act, 21 U.S.C. section 360bbb-3(b)(1), unless the authorization is terminated or revoked sooner.  Performed at [facility], [address] [place], [place] [address]     Procedures and diagnostic studies:  DG Chest Portable 1 View  Result Date: 04/26/2020 CLINICAL DATA:  Chest pain that started this morning pain all over to LEFT jaw and LEFT arm EXAM: PORTABLE CHEST 1 VIEW COMPARISON:  Dec 29, 2019 FINDINGS: Pacer  defibrillator pad overlies the central chest and LEFT lower chest. Trachea midline. Heart size remains enlarged post median sternotomy for CABG. Hilar structures are stable. Lungs are clear.  No sign of pleural effusion. No acute skeletal process to the extent evaluated. IMPRESSION: No effusion or consolidation. Stable findings of postoperative changes related to median sternotomy and CABG. Electronically Signed   By: Zetta Bills M.D.   On: 04/26/2020 11:53               LOS: 1 day   Dontasia Miranda  [facility]   Pager on [email or web address]. If 7PM-7AM, please contact night-coverage at [email or web address]     04/27/2020, 4:40 PM

## 2020-04-27 NOTE — Progress Notes (Signed)
[facility] Cardiology    SUBJECTIVE: Patient reports feeling better this morning, and son reports that patient appears to have more "pep in her step." She has chronic chest pain, and has chest tightness this morning, but states that the intensity is at her baseline, and much improved from yesterday. She feels fatigued this morning.   Vitals:   04/27/20 0300 04/27/20 0330 04/27/20 0400 04/27/20 0453  BP: 126/76 132/73 136/75 116/67  Pulse: 88 88 89 86  Resp:      Temp: (!) 97.5 F (36.4 C)  97.7 F (36.5 C) 97.9 F (36.6 C)  TempSrc: Oral  Oral Oral  SpO2: 100% 99% 100% 99%  Weight:    51.8 kg  Height:         Intake/Output Summary (Last 24 hours) at 04/27/2020 0946 Last data filed at 04/27/2020 0400 Gross per 24 hour  Intake 1042 ml  Output --  Net 1042 ml      PHYSICAL EXAM   General: Frail elderly female sitting up in bed in no acute distress HEENT:  bruising around R eye Neck:   No JVD.  Lungs: Clear bilaterally to auscultation, normal effort of breathing on room air Heart: HRRR . Normal S1 and S2 without gallops or murmurs.  Abdomen: nondistended Msk:  Back normal, gait not assessed. No obvious deformities. Extremities: No clubbing, cyanosis or edema.   Neuro: Alert and oriented X 3. Psych:  Good affect, responds appropriately  Skin: Pale, dry, no diaphoresis  LABS: Basic Metabolic Panel: Recent Labs    04/26/20 1134 04/27/20 0450  NA 129* 130*  K 5.1 4.5  CL 100 104  CO2 14* 16*  GLUCOSE 294* 166*  BUN 48* 38*  CREATININE 1.42* 1.05*  CALCIUM 8.9 8.6*   Liver Function Tests: No results for input(s): AST, ALT, ALKPHOS, BILITOT, PROT, ALBUMIN in the last 72 hours. No results for input(s): LIPASE, AMYLASE in the last 72 hours. CBC: Recent Labs    04/26/20 1134 04/27/20 0450  WBC 4.9 3.5*  NEUTROABS 2.9  --   HGB 6.1* 10.0*  HCT 17.5* 28.1*  MCV 101.7* 93.4  PLT 35* 24*   Cardiac Enzymes: No results for input(s): CKTOTAL, CKMB, CKMBINDEX, TROPONINI  in the last 72 hours. BNP: Invalid input(s): POCBNP D-Dimer: No results for input(s): DDIMER in the last 72 hours. Hemoglobin A1C: Recent Labs    04/26/20 1525  HGBA1C 6.8*   Fasting Lipid Panel: No results for input(s): CHOL, HDL, LDLCALC, TRIG, CHOLHDL, LDLDIRECT in the last 72 hours. Thyroid Function Tests: No results for input(s): TSH, T4TOTAL, T3FREE, THYROIDAB in the last 72 hours.  Invalid input(s): FREET3 Anemia Panel: Recent Labs    04/26/20 1525  VITAMINB12 934*  FOLATE 105.0  FERRITIN 5,988*  TIBC 335  IRON 160  RETICCTPCT 1.6    DG Chest Portable 1 View  Result Date: 04/26/2020 CLINICAL DATA:  Chest pain that started this morning pain all over to LEFT jaw and LEFT arm EXAM: PORTABLE CHEST 1 VIEW COMPARISON:  Dec 29, 2019 FINDINGS: Pacer defibrillator pad overlies the central chest and LEFT lower chest. Trachea midline. Heart size remains enlarged post median sternotomy for CABG. Hilar structures are stable. Lungs are clear.  No sign of pleural effusion. No acute skeletal process to the extent evaluated. IMPRESSION: No effusion or consolidation. Stable findings of postoperative changes related to median sternotomy and CABG. Electronically Signed   By: Zetta Bills M.D.   On: 04/26/2020 11:53     Echo Pending  TELEMETRY: sinus rhyhm  ASSESSMENT AND PLAN:  Principal Problem:   Chest pain due to CAD [facility]) Active Problems:   Diabetes mellitus with complication (HCC)   Essential hypertension   Complex partial seizure disorder (HCC)   Anemia   Severe thrombocytopenia (HCC)    1. NSTEMI, with known extensive coronary artery disease, previously not amendable to intervention, with abnormal ECG and elevated troponin of 813->1,246->5,303->15,900, in the setting of marked anemia. Patient has chronic angina, though chest pain was worse on presentation, has since improved after 2 units of PRBCs.  2. Coronary artery disease, CABG x4 in 1997, NSTEMI 2017, multiple  stents, STEMI in 12/2019, treated medically, chronic angina on Ranexa; previous cardiac catheterization in 10/7364 complicated by acute respiratory distress requiring intubation with findings overall unchanged from previous cath in 2017, with LVEF 35%, with severe multivessel native disease with occlusion proximal LAD, circumflex, and RCA, with total occlusion of SVG to RCA, circumflex and diagonal, with patent LIMA to mid LAD.   3. Marked anemia/thrombocytopneia, initial hemoglobin 6.1, platelets 35, heme negative stool, hematology following and recommending outpatient bone marrow biopsy. No evidence of gross GI bleeding. Hemoglobin improved to 10.0 after 2 units of PRBCs, platelets 24.   Recommendations: 1. Defer heparin drip in light of anemia 2. Continue DAPT for now 3. Review 2D echocardiogram 4. Continue to treat conservatively. Defer cardiac catheterization at this time as patient has multivessel disease with only patent LIMA-mid LAD. 5. Continue metoprolol succinate 25 mg, diltiazem 120 mg, Ranexa 1000 mg BID, atorvastatin 40 mg, and ramipril 5 mg 6. Thrombocytopenia/anemia management per hematologist.  Clabe Seal, PA-C 04/27/2020 9:46 AM

## 2020-04-28 ENCOUNTER — Inpatient Hospital Stay: Payer: Medicare Other

## 2020-04-28 LAB — BASIC METABOLIC PANEL
Anion gap: 8 (ref 5–15)
BUN: 38 mg/dL — ABNORMAL HIGH (ref 8–23)
CO2: 18 mmol/L — ABNORMAL LOW (ref 22–32)
Calcium: 8 mg/dL — ABNORMAL LOW (ref 8.9–10.3)
Chloride: 100 mmol/L (ref 98–111)
Creatinine, Ser: 1.28 mg/dL — ABNORMAL HIGH (ref 0.44–1.00)
GFR calc Af Amer: 46 mL/min — ABNORMAL LOW (ref >60)
GFR calc non Af Amer: 39 mL/min — ABNORMAL LOW (ref >60)
Glucose, Bld: 217 mg/dL — ABNORMAL HIGH (ref 70–99)
Potassium: 4.6 mmol/L (ref 3.5–5.1)
Sodium: 126 mmol/L — ABNORMAL LOW (ref 135–145)

## 2020-04-28 LAB — TYPE AND SCREEN
ABO/RH(D): A POS
Antibody Screen: NEGATIVE
Unit division: 0
Unit division: 0

## 2020-04-28 LAB — CBC WITH DIFFERENTIAL/PLATELET
Abs Immature Granulocytes: 0.05 10*3/uL (ref 0.00–0.07)
Basophils Absolute: 0 10*3/uL (ref 0.0–0.1)
Basophils Relative: 0 %
Eosinophils Absolute: 0.1 10*3/uL (ref 0.0–0.5)
Eosinophils Relative: 3 %
HCT: 24.8 % — ABNORMAL LOW (ref 36.0–46.0)
Hemoglobin: 9.1 g/dL — ABNORMAL LOW (ref 12.0–15.0)
Immature Granulocytes: 2 %
Lymphocytes Relative: 21 %
Lymphs Abs: 0.7 10*3/uL (ref 0.7–4.0)
MCH: 34.3 pg — ABNORMAL HIGH (ref 26.0–34.0)
MCHC: 36.7 g/dL — ABNORMAL HIGH (ref 30.0–36.0)
MCV: 93.6 fL (ref 80.0–100.0)
Monocytes Absolute: 0.6 10*3/uL (ref 0.1–1.0)
Monocytes Relative: 20 %
Neutro Abs: 1.7 10*3/uL (ref 1.7–7.7)
Neutrophils Relative %: 54 %
Platelets: 42 10*3/uL — ABNORMAL LOW (ref 150–400)
RBC: 2.65 MIL/uL — ABNORMAL LOW (ref 3.87–5.11)
RDW: 17.9 % — ABNORMAL HIGH (ref 11.5–15.5)
WBC: 3.1 10*3/uL — ABNORMAL LOW (ref 4.0–10.5)
nRBC: 1.3 % — ABNORMAL HIGH (ref 0.0–0.2)

## 2020-04-28 LAB — BPAM RBC
Blood Product Expiration Date: 202109072359
Blood Product Expiration Date: 202110012359
ISSUE DATE / TIME: 202109031449
ISSUE DATE / TIME: 202109040012
Unit Type and Rh: 600
Unit Type and Rh: 6200

## 2020-04-28 LAB — GLUCOSE, CAPILLARY
Glucose-Capillary: 146 mg/dL — ABNORMAL HIGH (ref 70–99)
Glucose-Capillary: 181 mg/dL — ABNORMAL HIGH (ref 70–99)
Glucose-Capillary: 238 mg/dL — ABNORMAL HIGH (ref 70–99)
Glucose-Capillary: 218 mg/dL — ABNORMAL HIGH (ref 70–99)

## 2020-04-28 MED ORDER — ASPIRIN EC 81 MG PO TBEC
81.0000 mg | DELAYED_RELEASE_TABLET | Freq: Every day | ORAL | Status: DC
  Administered 2020-04-28 – 2020-05-08 (×11): 81 mg via ORAL
  Filled 2020-04-28 (×11): qty 1

## 2020-04-28 MED ORDER — MIDODRINE HCL 5 MG PO TABS
5.0000 mg | ORAL_TABLET | Freq: Once | ORAL | Status: AC
  Administered 2020-04-28: 5 mg via ORAL
  Filled 2020-04-28: qty 1

## 2020-04-28 MED ORDER — SODIUM CHLORIDE 0.9 % IV BOLUS: 500.0000 mL | Freq: Once | INTRAVENOUS | Status: DC

## 2020-04-28 MED ORDER — MIDODRINE HCL 5 MG PO TABS
5.0000 mg | ORAL_TABLET | Freq: Three times a day (TID) | ORAL | Status: DC
  Administered 2020-04-28 – 2020-05-02 (×11): 5 mg via ORAL
  Filled 2020-04-28 (×11): qty 1

## 2020-04-28 MED ORDER — MAGNESIUM HYDROXIDE 400 MG/5ML PO SUSP
30.0000 mL | Freq: Every day | ORAL | Status: DC | PRN
  Administered 2020-04-28: 30 mL via ORAL
  Filled 2020-04-28: qty 30

## 2020-04-28 MED ORDER — MIDODRINE HCL 5 MG PO TABS: 5.0000 mg | ORAL_TABLET | Freq: Three times a day (TID) | ORAL | Status: DC

## 2020-04-28 NOTE — Progress Notes (Signed)
Pt resting quietly in bed. Son at bedside.

## 2020-04-28 NOTE — Progress Notes (Addendum)
Progress Note    Leah Small  KDT:[id number] DOB: June 13, 1941  DOA: 04/26/2020 PCP: Idelle Crouch, MD      Brief Narrative:    Medical records reviewed and are as summarized below:  Leah Small is a 79 y.o. female       Assessment/Plan:   Principal Problem:   Non-STEMI (non-ST elevated myocardial infarction) ([place]) Active Problems:   Diabetes mellitus with complication ([place])   Essential hypertension   Complex partial seizure disorder ([place])   Chest pain due to CAD ([place])   Anemia   Severe thrombocytopenia ([place])    Acute NSTEMI, multivessel CAD with CABG - Troponin peaked at 15,900 Severe anemia Severe thrombocytopenia Type II DM Hypotension but has history of essential hypertension Seizure disorder Elevated vitamin B12 level (934) History of stroke Chronic systolic CHF (EF less than 20% in April 2021) Hyponatremia  PLAN  2D echo is pending. She is asymptomatic from hypotension. Hemoglobin improved s/p transfusion with 2 units of PRBCs on 04/26/2020 Hold Cardizem and metoprolol because of hypotension Midodrine has been started for hypotension.  Monitor BP closely Resume aspirin but continue to hold Plavix (patient and son are okay with this plan) Continue antiepileptics Monitor BMP. Patient will likely need bone marrow biopsy as anoutpatient.  This will be scheduled by the hematologist. Plan discussed with the patient.  She said she understands the risks of not continuing aspirin and Plavix.  Patient also said she is a DO NOT RESUSCITATE.  She said she had mentioned this to the ED physician on admission.  Margaret, RN was at the bedside during this encounter. Plan was discussed with Elenore Rota, patient's son, over the phone.   Body mass index is 19.43 kg/m.  Diet Order            Diet Carb Modified Fluid consistency: Thin; Room service appropriate? Yes  Diet effective now                     Consultants:  Cardiologist  Hematologist  Procedures:  None    Medications:   . sodium chloride   Intravenous Once  . atorvastatin  40 mg Oral Daily  . brinzolamide  1 drop Both Eyes BID   And  . brimonidine  1 drop Both Eyes BID  . folic acid  1 mg Oral Daily  . insulin aspart  0-15 Units Subcutaneous TID WC  . lamoTRIgine  100 mg Oral Daily  . levETIRAcetam  500 mg Oral BID  . mouth rinse  15 mL Mouth Rinse BID  . ranolazine  1,000 mg Oral BID  . sodium chloride flush  3 mL Intravenous Q12H  . timolol  1 drop Both Eyes BID  . vitamin B-12  500 mcg Oral BH-q7a  . vitamin E  400 Units Oral BH-q7a   Continuous Infusions: . sodium chloride    . sodium chloride    . sodium chloride       Anti-infectives (From admission, onward)   None             Family Communication/Anticipated D/C date and plan/Code Status   DVT prophylaxis: SCDs Start: 04/26/20 1428     Code Status: DNR. Discussed with patient. She sad she's DNR. Witnessed by Burman Nieves, RN.  Family Communication: Plan discussed with patient. Disposition Plan:    Status is: Inpatient  Remains inpatient appropriate because:Inpatient level of care appropriate due to severity of illness   Dispo: The patient is  from: Home              Anticipated d/c is to: Home              Anticipated d/c date is: 1 day              Patient currently is not medically stable to d/c.           Subjective:   Interval events noted.  No chest pain or shortness of breath.  However, blood pressure has been running very low  Objective:    Vitals:   04/28/20 0149 04/28/20 0338 04/28/20 0542 04/28/20 0902  BP: (!) 70/52 (!) 84/51 (!) 96/56 101/75  Pulse: 64 (!) 57 64 70  Resp: $Remo'16 16  16  'YIEJb$ Temp: 97.6 F (36.4 C) 97.7 F (36.5 C)  98 F (36.7 C)  TempSrc: Oral   Oral  SpO2: 100% 100%  100%  Weight:  54.6 kg    Height:       No data found.   Intake/Output Summary (Last 24 hours) at  04/28/2020 0939 Last data filed at 04/28/2020 0500 Gross per 24 hour  Intake 500 ml  Output 0 ml  Net 500 ml   Filed Weights   04/26/20 1116 04/27/20 0453 04/28/20 0338  Weight: 56.7 kg 51.8 kg 54.6 kg    Exam:  GEN: NAD SKIN: No rash EYES: EOMI ENT: MMM CV: RRR PULM: CTA B ABD: soft, ND, NT, +BS CNS: AAO x 3, non focal EXT: No edema or tenderness   Data Reviewed:   I have personally reviewed following labs and imaging studies:  Labs: Labs show the following:   Basic Metabolic Panel: Recent Labs  Lab 04/26/20 1134 04/27/20 0450  NA 129* 130*  K 5.1 4.5  CL 100 104  CO2 14* 16*  GLUCOSE 294* 166*  BUN 48* 38*  CREATININE 1.42* 1.05*  CALCIUM 8.9 8.6*   GFR Estimated Creatinine Clearance: 36.8 mL/min (A) (by C-G formula based on SCr of 1.05 mg/dL (H)). Liver Function Tests: No results for input(s): AST, ALT, ALKPHOS, BILITOT, PROT, ALBUMIN in the last 168 hours. No results for input(s): LIPASE, AMYLASE in the last 168 hours. No results for input(s): AMMONIA in the last 168 hours. Coagulation profile Recent Labs  Lab 04/26/20 1525  INR 1.1    CBC: Recent Labs  Lab 04/26/20 1134 04/27/20 0450  WBC 4.9 3.5*  NEUTROABS 2.9  --   HGB 6.1* 10.0*  HCT 17.5* 28.1*  MCV 101.7* 93.4  PLT 35* 24*   Cardiac Enzymes: No results for input(s): CKTOTAL, CKMB, CKMBINDEX, TROPONINI in the last 168 hours. BNP (last 3 results) No results for input(s): PROBNP in the last 8760 hours. CBG: Recent Labs  Lab 04/27/20 1004 04/27/20 1145 04/27/20 1707 04/27/20 2121 04/28/20 0743  GLUCAP 178* 195* 191* 192* 146*   D-Dimer: No results for input(s): DDIMER in the last 72 hours. Hgb A1c: Recent Labs    04/26/20 1525  HGBA1C 6.8*   Lipid Profile: No results for input(s): CHOL, HDL, LDLCALC, TRIG, CHOLHDL, LDLDIRECT in the last 72 hours. Thyroid function studies: No results for input(s): TSH, T4TOTAL, T3FREE, THYROIDAB in the last 72 hours.  Invalid  input(s): FREET3 Anemia work up: Recent Labs    04/26/20 1525  VITAMINB12 934*  FOLATE 105.0  FERRITIN 5,988*  TIBC 335  IRON 160  RETICCTPCT 1.6   Sepsis Labs: Recent Labs  Lab 04/26/20 1134 04/27/20 0450  WBC 4.9 3.5*  Microbiology Recent Results (from the past 240 hour(s))  SARS Coronavirus 2 by RT PCR (hospital order, performed in [facility] hospital lab) Nasopharyngeal Nasopharyngeal Swab     Status: None   Collection Time: 04/26/20 11:34 AM   Specimen: Nasopharyngeal Swab  Result Value Ref Range Status   SARS Coronavirus 2 NEGATIVE NEGATIVE Final    Comment: (NOTE) SARS-CoV-2 target nucleic acids are NOT DETECTED.  The SARS-CoV-2 RNA is generally detectable in upper and lower respiratory specimens during the acute phase of infection. The lowest concentration of SARS-CoV-2 viral copies this assay can detect is 250 copies / mL. A negative result does not preclude SARS-CoV-2 infection and should not be used as the sole basis for treatment or other patient management decisions.  A negative result may occur with improper specimen collection / handling, submission of specimen other than nasopharyngeal swab, presence of viral mutation(s) within the areas targeted by this assay, and inadequate number of viral copies (<250 copies / mL). A negative result must be combined with clinical observations, patient history, and epidemiological information.  Fact Sheet for Patients:   StrictlyIdeas.no  Fact Sheet for Healthcare Providers: BankingDealers.co.za  This test is not yet approved or  cleared by the [place] FDA and has been authorized for detection and/or diagnosis of SARS-CoV-2 by FDA under an Emergency Use Authorization (EUA).  This EUA will remain in effect (meaning this test can be used) for the duration of the COVID-19 declaration under Section 564(b)(1) of the Act, 21 U.S.C. section 360bbb-3(b)(1), unless  the authorization is terminated or revoked sooner.  Performed at [facility], [address] [place], [place] [address]     Procedures and diagnostic studies:  DG Chest Portable 1 View  Result Date: 04/26/2020 CLINICAL DATA:  Chest pain that started this morning pain all over to LEFT jaw and LEFT arm EXAM: PORTABLE CHEST 1 VIEW COMPARISON:  Dec 29, 2019 FINDINGS: Pacer defibrillator pad overlies the central chest and LEFT lower chest. Trachea midline. Heart size remains enlarged post median sternotomy for CABG. Hilar structures are stable. Lungs are clear.  No sign of pleural effusion. No acute skeletal process to the extent evaluated. IMPRESSION: No effusion or consolidation. Stable findings of postoperative changes related to median sternotomy and CABG. Electronically Signed   By: Zetta Bills M.D.   On: 04/26/2020 11:53               LOS: 2 days   Avion Kutzer  [facility]   Pager on [email or web address]. If 7PM-7AM, please contact night-coverage at [email or web address]     04/28/2020, 9:39 AM

## 2020-04-28 NOTE — Progress Notes (Signed)
Patients blood pressure came up to 96/56. She states that she "feels fine" and that her eye drops will cause her blood pressure to drop low at night when she's home. She usually puts 3 pillows under her knees and 2 behind her head. She also said that sometimes the eye drops cause her to have slurred speech. Speech was clear throughout night. Bladder scanned patient for 348 since she had no output overnight. She reports that she does not feel like she has to use the bathroom, educated patient on drinking more fluids. Patient verbalized understanding. Patient requesting something to help her move her bowels this morning. Denies chest pain and SOB, fall bell within reach. Will continue with the current plan of care.

## 2020-04-28 NOTE — Progress Notes (Signed)
Pt MEWS score changed to yellow. Patient is alert, says she feels fine. Speech clear, she is on the phone ordering lunch. Informed MD via secure chat about MEWS change. Will continue to monitor patient.

## 2020-04-28 NOTE — Progress Notes (Signed)
   04/28/20 0149  Assess: MEWS Score  Temp 97.6 F (36.4 C)  BP (!) 70/52  Pulse Rate 64  Resp 16  Level of Consciousness Alert  SpO2 100 %  O2 Device Nasal Cannula  O2 Flow Rate (L/min) 2 L/min  Assess: MEWS Score  MEWS Temp 0  MEWS Systolic 2  MEWS Pulse 0  MEWS RR 0  MEWS LOC 0  MEWS Score 2  MEWS Score Color Yellow  Assess: if the MEWS score is Yellow or Red  Were vital signs taken at a resting state? No  Focused Assessment No change from prior assessment  Early Detection of Sepsis Score *See Row Information* Low  MEWS guidelines implemented *See Row Information* No, vital signs rechecked  Treat  MEWS Interventions Other (Comment) (E. Ouma notified again)  Pain Scale 0-10  Pain Score 0  Take Vital Signs  Increase Vital Sign Frequency  Yellow: Q 2hr X 2 then Q 4hr X 2, if remains yellow, continue Q 4hrs  Escalate  MEWS: Escalate Yellow: discuss with charge nurse/RN and consider discussing with provider and RRT  Notify: Provider  Provider Name/Title Webb Silversmith  Date Provider Notified 04/28/20  Time Provider Notified [phone]  Notification Type Page  Notification Reason Other (Comment) (BP still low)  Response No new orders  Webb Silversmith NP notified that midodrine and 500cc bolus of NSS did not increase BP. Patient resting in bed, continues to be asymptomatic at this time. Will monitor to keep Map > 60 per E. Keane Scrape

## 2020-04-28 NOTE — Progress Notes (Signed)
Mobility Specialist - Progress Note   04/28/20 1640  Mobility  Activity Transferred to/from [facility]  Level of Assistance Minimal assist, patient does 75% or more  Assistive Device BSC  Distance Ambulated (ft) 0 ft  Mobility Response Tolerated well  Mobility performed by Mobility specialist  $Mobility charge 1 Mobility    Pre-mobility: 73 HR, 123/76 BP, 100% SpO2 During mobility: 80 HR, 78% SpO2 Post-mobility: 77 HR, 100% SpO2   Pt was lying in bed with nurse and son present in room upon arrival. Pt agreed to session. Pt was utilizing 2L [place] O2, but removed O2 for BSC transfer. Pt was SBA getting to EOB and minA while standing and turning to [facility]. No AD was used this session, pt's son assisted in transfer. Pt c/o SOB, O2 desat to 78%. Mobility tech went over PLB exercises with pt, O2 increased to 80%. After getting pt back to bed, pt was put back on 2L, and O2 levels immediately increased. Pt rated her RPE this session an "8/10". Overall, pt tolerated session well. Pt was left in bed with phone/call bell in reach. Nurse entered room at the end of session and was notified of performance.   Filiberto Pinks Mobility Specialist 04/28/20, 4:49 PM

## 2020-04-28 NOTE — Progress Notes (Signed)
[facility] Cardiology  SUBJECTIVE: Patient laying in bed, denies chest pain   Vitals:   04/28/20 0149 04/28/20 0338 04/28/20 0542 04/28/20 0902  BP: (!) 70/52 (!) 84/51 (!) 96/56 101/75  Pulse: 64 (!) 57 64 70  Resp: _0 Temp: 97.6 F (36.4 C) 97.7 F (36.5 C)  98 F (36.7 C)  TempSrc: Oral   Oral  SpO2: 100% 100%  100%  Weight:  54.6 kg    Height:         Intake/Output Summary (Last 24 hours) at 04/28/2020 4210 Last data filed at 04/28/2020 0500 Gross per 24 hour  Intake 500 ml  Output 0 ml  Net 500 ml      PHYSICAL EXAM  General: Well developed, well nourished, in no acute distress HEENT:  Normocephalic and atramatic Neck:  No JVD.  Lungs: Clear bilaterally to auscultation and percussion. Heart: HRRR . Normal S1 and S2 without gallops or murmurs.  Abdomen: Bowel sounds are positive, abdomen soft and non-tender  Msk:  Back normal, normal gait. Normal strength and tone for age. Extremities: No clubbing, cyanosis or edema.   Neuro: Alert and oriented X 3. Psych:  Good affect, responds appropriately   LABS: Basic Metabolic Panel: Recent Labs    04/26/20 1134 04/27/20 0450  NA 129* 130*  K 5.1 4.5  CL 100 104  CO2 14* 16*  GLUCOSE 294* 166*  BUN 48* 38*  CREATININE 1.42* 1.05*  CALCIUM 8.9 8.6*   Liver Function Tests: No results for input(s): AST, ALT, ALKPHOS, BILITOT, PROT, ALBUMIN in the last 72 hours. No results for input(s): LIPASE, AMYLASE in the last 72 hours. CBC: Recent Labs    04/26/20 1134 04/27/20 0450  WBC 4.9 3.5*  NEUTROABS 2.9  --   HGB 6.1* 10.0*  HCT 17.5* 28.1*  MCV 101.7* 93.4  PLT 35* 24*   Cardiac Enzymes: No results for input(s): CKTOTAL, CKMB, CKMBINDEX, TROPONINI in the last 72 hours. BNP: Invalid input(s): POCBNP D-Dimer: No results for input(s): DDIMER in the last 72 hours. Hemoglobin A1C: Recent Labs    04/26/20 1525  HGBA1C 6.8*   Fasting Lipid Panel: No results for input(s): CHOL, HDL, LDLCALC, TRIG,  CHOLHDL, LDLDIRECT in the last 72 hours. Thyroid Function Tests: No results for input(s): TSH, T4TOTAL, T3FREE, THYROIDAB in the last 72 hours.  Invalid input(s): FREET3 Anemia Panel: Recent Labs    04/26/20 1525  VITAMINB12 934*  FOLATE 105.0  FERRITIN 5,988*  TIBC 335  IRON 160  RETICCTPCT 1.6    DG Chest Portable 1 View  Result Date: 04/26/2020 CLINICAL DATA:  Chest pain that started this morning pain all over to LEFT jaw and LEFT arm EXAM: PORTABLE CHEST 1 VIEW COMPARISON:  Dec 29, 2019 FINDINGS: Pacer defibrillator pad overlies the central chest and LEFT lower chest. Trachea midline. Heart size remains enlarged post median sternotomy for CABG. Hilar structures are stable. Lungs are clear.  No sign of pleural effusion. No acute skeletal process to the extent evaluated. IMPRESSION: No effusion or consolidation. Stable findings of postoperative changes related to median sternotomy and CABG. Electronically Signed   By: Zetta Bills M.D.   On: 04/26/2020 11:53     Echo pending  TELEMETRY: Sinus rhythm:  ASSESSMENT AND PLAN:  Principal Problem:   Non-STEMI (non-ST elevated myocardial infarction) ([place]) Active Problems:   Diabetes mellitus with complication [facility])   Essential hypertension   Complex partial seizure disorder (HCC)   Chest pain due to CAD (  [place])   Anemia   Severe thrombocytopenia ([place])    1. NSTEMI, with known extensive coronary artery disease, previously not amendable to intervention, with abnormal ECG and elevated troponin of 813, 1,246, 5,303, 15,900, in the setting of marked anemia. Patient has chronic angina, though chest pain was worse on presentation, has since improved after 2 units of PRBCs.  The patient denies chest pain today.  2. Coronary artery disease, CABG x4 in 1997, NSTEMI 2017, multiple stents, STEMI in 12/2019, treated medically, chronic angina on Ranexa; previous cardiac catheterization in 08/6740 complicated by acute respiratory distress  requiring intubation with findings overall unchanged from previous cath in 2017, with LVEF 35%, with severe multivessel native disease with occlusion proximal LAD, left circumflex, and RCA, with total occlusion of SVG to RCA, circumflex and diagonal, with patent LIMA to mid LAD.   3. Marked anemia/thrombocytopneia, initial hemoglobin 6.1, platelets 35, heme negative stool, hematology following and recommending outpatient bone marrow biopsy. No evidence of gross GI bleeding. Hemoglobin improved to 10.0 after 2 units of PRBCs, platelets 24.  Recommendations  1.  Agree with current therapy 2.  Continue dual antiplatelet therapy 3.  Defer heparin drip 4.  Defer cardiac catheterization 5.  Review 2D echocardiogram 6.  Continue metoprolol succinate, diltiazem, Ranexa, ramipril and atorvastatin 7.  Thrombocytopenia/anemia per hematology, bone marrow biopsy pending    Isaias Cowman, MD, PhD, [facility] 04/28/2020 9:22 AM

## 2020-04-28 NOTE — Progress Notes (Signed)
Administered Midodrine per MD order. Pt BP increased 93/53. MEWS score changed to green. Patient alert and eating lunch. Will continue to monitor.

## 2020-04-29 ENCOUNTER — Encounter: Payer: Self-pay | Admitting: Oncology

## 2020-04-29 ENCOUNTER — Inpatient Hospital Stay
Admit: 2020-04-29 | Discharge: 2020-04-29 | Disposition: A | Discharge status: Home / Self Care | Payer: Medicare Other | Attending: Physician Assistant | Admitting: Physician Assistant

## 2020-04-29 DIAGNOSIS — I249 Acute ischemic heart disease, unspecified: Secondary | ICD-10-CM

## 2020-04-29 LAB — BASIC METABOLIC PANEL
Anion gap: 8 (ref 5–15)
BUN: 32 mg/dL — ABNORMAL HIGH (ref 8–23)
CO2: 20 mmol/L — ABNORMAL LOW (ref 22–32)
Calcium: 8.1 mg/dL — ABNORMAL LOW (ref 8.9–10.3)
Chloride: 100 mmol/L (ref 98–111)
Creatinine, Ser: 0.97 mg/dL (ref 0.44–1.00)
GFR calc Af Amer: >60 mL/min (ref >60)
GFR calc non Af Amer: 55 mL/min — ABNORMAL LOW (ref >60)
Glucose, Bld: 228 mg/dL — ABNORMAL HIGH (ref 70–99)
Potassium: 4.7 mmol/L (ref 3.5–5.1)
Sodium: 128 mmol/L — ABNORMAL LOW (ref 135–145)

## 2020-04-29 LAB — ECHOCARDIOGRAM COMPLETE
AR max vel: 1.18 cm2
AV Area VTI: 1.24 cm2
AV Area mean vel: 1.19 cm2
AV Mean grad: 3 mmHg
AV Peak grad: 5.9 mmHg
Ao pk vel: 1.21 m/s
Area-P 1/2: 4.89 cm2
Height: 66 in
S' Lateral: 4.88 cm
Weight: 1925.94 oz

## 2020-04-29 LAB — CBC WITH DIFFERENTIAL/PLATELET
Abs Immature Granulocytes: 0.03 10*3/uL (ref 0.00–0.07)
Basophils Absolute: 0 10*3/uL (ref 0.0–0.1)
Basophils Relative: 0 %
Eosinophils Absolute: 0 10*3/uL (ref 0.0–0.5)
Eosinophils Relative: 1 %
HCT: 25.8 % — ABNORMAL LOW (ref 36.0–46.0)
Hemoglobin: 8.9 g/dL — ABNORMAL LOW (ref 12.0–15.0)
Immature Granulocytes: 1 %
Lymphocytes Relative: 16 %
Lymphs Abs: 0.6 10*3/uL — ABNORMAL LOW (ref 0.7–4.0)
MCH: 32.8 pg (ref 26.0–34.0)
MCHC: 34.5 g/dL (ref 30.0–36.0)
MCV: 95.2 fL (ref 80.0–100.0)
Monocytes Absolute: 0.7 10*3/uL (ref 0.1–1.0)
Monocytes Relative: 21 %
Neutro Abs: 2.1 10*3/uL (ref 1.7–7.7)
Neutrophils Relative %: 61 %
Platelets: 70 10*3/uL — ABNORMAL LOW (ref 150–400)
RBC: 2.71 MIL/uL — ABNORMAL LOW (ref 3.87–5.11)
RDW: 18.2 % — ABNORMAL HIGH (ref 11.5–15.5)
WBC: 3.4 10*3/uL — ABNORMAL LOW (ref 4.0–10.5)
nRBC: 0 % (ref 0.0–0.2)

## 2020-04-29 LAB — GLUCOSE, CAPILLARY
Glucose-Capillary: 176 mg/dL — ABNORMAL HIGH (ref 70–99)
Glucose-Capillary: 192 mg/dL — ABNORMAL HIGH (ref 70–99)
Glucose-Capillary: 220 mg/dL — ABNORMAL HIGH (ref 70–99)
Glucose-Capillary: 223 mg/dL — ABNORMAL HIGH (ref 70–99)

## 2020-04-29 LAB — PREPARE RBC (CROSSMATCH)

## 2020-04-29 MED ORDER — METOPROLOL SUCCINATE ER 25 MG PO TB24
25.0000 mg | ORAL_TABLET | Freq: Every day | ORAL | Status: DC
  Administered 2020-04-29 – 2020-05-08 (×9): 25 mg via ORAL
  Filled 2020-04-29 (×10): qty 1

## 2020-04-29 MED ORDER — PERFLUTREN LIPID MICROSPHERE
1.0000 mL | INTRAVENOUS | Status: AC | PRN
  Administered 2020-04-29: 2 mL via INTRAVENOUS
  Filled 2020-04-29: qty 10

## 2020-04-29 MED ORDER — CLOPIDOGREL BISULFATE 75 MG PO TABS
75.0000 mg | ORAL_TABLET | Freq: Every day | ORAL | Status: DC
  Administered 2020-04-29 – 2020-05-08 (×10): 75 mg via ORAL
  Filled 2020-04-29 (×11): qty 1

## 2020-04-29 NOTE — Progress Notes (Signed)
Mobility Specialist - Progress Note   04/29/20 1156  Mobility  Activity Transferred:  Bed to chair  Level of Assistance Minimal assist, patient does 75% or more  Assistive Device Front wheel walker  Distance Ambulated (ft) 5 ft  Mobility Response Tolerated well  Mobility performed by Mobility specialist  $Mobility charge 1 Mobility    Pre-mobility: 69 HR, 95/57 BP, 100% SpO2 During mobility: 75 HR, 96% SpO2 Post-mobility: 73 HR, 119/67 BP, 100% SpO2    Pt laying in bed upon arrival. Pt agreed to session. Pt able to get to EOB min. Assist w/ elevating UE. Pt able to S2S and transfer from bed to recliner w/ min. Assist using a RW. Initially, pt was unsteady after S2S. Pt gains stability after taking 3 deep breaths standing at bedside. Pt had unsteady gait during transfer, but was easily corrected by verbal cues. Overall, pt tolerated session very well. Pt did state she was having chest pain after session, rating pain 7/10. Nurse was notified. Pt left sitting on recliner w/ alarm set. Call bell and phone placed in reach.     Lenwood Balsam Mobility Specialist  04/29/20, 12:05 PM

## 2020-04-29 NOTE — Care Management Important Message (Signed)
Important Message  Patient Details  Name: Leah Small MRN: [id number] Date of Birth: [birth year]   Medicare Important Message Given:  Yes     [name] 04/29/2020, 1:12 PM

## 2020-04-29 NOTE — Progress Notes (Signed)
*  PRELIMINARY RESULTS* Echocardiogram 2D Echocardiogram has been performed.  Joanette Gula Zeenat Jeanbaptiste 04/29/2020, 8:08 AM

## 2020-04-29 NOTE — Progress Notes (Signed)
Progress Note    Leah Small  CHE:[id number] DOB: [birth year]  DOA: 04/26/2020 PCP: Idelle Crouch, MD      Brief Narrative:    Medical records reviewed and are as summarized below:  Leah Small is a 79 y.o. female       Assessment/Plan:   Principal Problem:   Non-STEMI (non-ST elevated myocardial infarction) ([place]) Active Problems:   Diabetes mellitus with complication ([place])   Essential hypertension   Complex partial seizure disorder ([place])   Chest pain due to CAD ([place])   Anemia   Severe thrombocytopenia ([place])    Acute NSTEMI, multivessel CAD with CABG - Troponin peaked at 15,900 Severe anemia Severe thrombocytopenia Type II DM Hypotension but has history of essential hypertension Seizure disorder Elevated vitamin B12 level (934) History of stroke Chronic systolic CHF (EF less than 20% in April 2021) Hyponatremia-likely chronic from CHF  PLAN  2D echo showed EF of 20 to 25%, Moderate MR. BP is better after starting midodrine.  Continue midodrine. Hemoglobin improved s/p transfusion with 2 units of PRBCs on 04/26/2020 Resume metoprolol and monitor BP closely.   Ramipril and Cardizem still on hold. Cardiologist recommended continuation of Plavix.  Continue aspirin. Continue antiepileptics Bone marrow biopsy has been scheduled for tomorrow PT evaluation. Plan discussed with the patient.   Body mass index is 19.43 kg/m.  Diet Order            Diet NPO time specified  Diet effective midnight           Diet Carb Modified Fluid consistency: Thin; Room service appropriate? Yes  Diet effective now                    Consultants:  Cardiologist  Hematologist  Procedures:  None    Medications:   . sodium chloride   Intravenous Once  . aspirin EC  81 mg Oral Daily  . atorvastatin  40 mg Oral Daily  . brinzolamide  1 drop Both Eyes BID   And  . brimonidine  1 drop Both Eyes BID  . clopidogrel  75 mg Oral Daily  . folic acid   1 mg Oral Daily  . insulin aspart  0-15 Units Subcutaneous TID WC  . lamoTRIgine  100 mg Oral Daily  . levETIRAcetam  500 mg Oral BID  . mouth rinse  15 mL Mouth Rinse BID  . midodrine  5 mg Oral TID WC  . ranolazine  1,000 mg Oral BID  . sodium chloride flush  3 mL Intravenous Q12H  . timolol  1 drop Both Eyes BID  . vitamin B-12  500 mcg Oral BH-q7a  . vitamin E  400 Units Oral BH-q7a   Continuous Infusions: . sodium chloride    . sodium chloride    . sodium chloride       Anti-infectives (From admission, onward)   None             Family Communication/Anticipated D/C date and plan/Code Status   DVT prophylaxis: SCDs Start: 04/26/20 1428     Code Status: DNR. Discussed with patient. She sad she's DNR. Witnessed by Burman Nieves, RN.  Family Communication: Plan discussed with patient. Disposition Plan:    Status is: Inpatient  Remains inpatient appropriate because:Inpatient level of care appropriate due to severity of illness   Dispo: The patient is from: Home              Anticipated d/c is  to: Home              Anticipated d/c date is: 1 day              Patient currently is not medically stable to d/c.           Subjective:   C/o feeling dizzy and short of breath when she got up to use the bedside commode.  Objective:    Vitals:   04/29/20 0130 04/29/20 0744 04/29/20 1045 04/29/20 1141  BP: 135/80 (!) 123/59 (!) 101/58 91/68  Pulse: 99 75 73 68  Resp:  18    Temp: 98 F (36.7 C) 98 F (36.7 C)  98.2 F (36.8 C)  TempSrc: Oral Oral  Oral  SpO2: 99% 100%  100%  Weight:      Height:       No data found.   Intake/Output Summary (Last 24 hours) at 04/29/2020 1408 Last data filed at 04/29/2020 0945 Gross per 24 hour  Intake 480 ml  Output 860 ml  Net -380 ml   Filed Weights   04/26/20 1116 04/27/20 0453 04/28/20 0338  Weight: 56.7 kg 51.8 kg 54.6 kg    Exam:  GEN: NAD SKIN: No rash EYES: EOMI ENT: MMM CV: RRR PULM: CTA  B ABD: soft, ND, NT, +BS CNS: AAO x 3, non focal EXT: No edema or tenderness    Data Reviewed:   I have personally reviewed following labs and imaging studies:  Labs: Labs show the following:   Basic Metabolic Panel: Recent Labs  Lab 04/26/20 1134 04/26/20 1134 04/27/20 0450 04/27/20 0450 04/28/20 1101 04/29/20 1032  NA 129*  --  130*  --  126* 128*  K 5.1   < > 4.5   < > 4.6 4.7  CL 100  --  104  --  100 100  CO2 14*  --  16*  --  18* 20*  GLUCOSE 294*  --  166*  --  217* 228*  BUN 48*  --  38*  --  38* 32*  CREATININE 1.42*  --  1.05*  --  1.28* 0.97  CALCIUM 8.9  --  8.6*  --  8.0* 8.1*   < > = values in this interval not displayed.   GFR Estimated Creatinine Clearance: 39.9 mL/min (by C-G formula based on SCr of 0.97 mg/dL). Liver Function Tests: No results for input(s): AST, ALT, ALKPHOS, BILITOT, PROT, ALBUMIN in the last 168 hours. No results for input(s): LIPASE, AMYLASE in the last 168 hours. No results for input(s): AMMONIA in the last 168 hours. Coagulation profile Recent Labs  Lab 04/26/20 1525  INR 1.1    CBC: Recent Labs  Lab 04/26/20 1134 04/27/20 0450 04/28/20 1101 04/29/20 1032  WBC 4.9 3.5* 3.1* 3.4*  NEUTROABS 2.9  --  1.7 2.1  HGB 6.1* 10.0* 9.1* 8.9*  HCT 17.5* 28.1* 24.8* 25.8*  MCV 101.7* 93.4 93.6 95.2  PLT 35* 24* 42* 70*   Cardiac Enzymes: No results for input(s): CKTOTAL, CKMB, CKMBINDEX, TROPONINI in the last 168 hours. BNP (last 3 results) No results for input(s): PROBNP in the last 8760 hours. CBG: Recent Labs  Lab 04/28/20 1157 04/28/20 1637 04/28/20 2048 04/29/20 0742 04/29/20 1132  GLUCAP 181* 238* 218* 176* 192*   D-Dimer: No results for input(s): DDIMER in the last 72 hours. Hgb A1c: Recent Labs    04/26/20 1525  HGBA1C 6.8*   Lipid Profile: No results for input(s): CHOL, HDL,  LDLCALC, TRIG, CHOLHDL, LDLDIRECT in the last 72 hours. Thyroid function studies: No results for input(s): TSH, T4TOTAL,  T3FREE, THYROIDAB in the last 72 hours.  Invalid input(s): FREET3 Anemia work up: Recent Labs    04/26/20 1525  VITAMINB12 934*  FOLATE 105.0  FERRITIN 5,988*  TIBC 335  IRON 160  RETICCTPCT 1.6   Sepsis Labs: Recent Labs  Lab 04/26/20 1134 04/27/20 0450 04/28/20 1101 04/29/20 1032  WBC 4.9 3.5* 3.1* 3.4*    Microbiology Recent Results (from the past 240 hour(s))  SARS Coronavirus 2 by RT PCR (hospital order, performed in [facility] hospital lab) Nasopharyngeal Nasopharyngeal Swab     Status: None   Collection Time: 04/26/20 11:34 AM   Specimen: Nasopharyngeal Swab  Result Value Ref Range Status   SARS Coronavirus 2 NEGATIVE NEGATIVE Final    Comment: (NOTE) SARS-CoV-2 target nucleic acids are NOT DETECTED.  The SARS-CoV-2 RNA is generally detectable in upper and lower respiratory specimens during the acute phase of infection. The lowest concentration of SARS-CoV-2 viral copies this assay can detect is 250 copies / mL. A negative result does not preclude SARS-CoV-2 infection and should not be used as the sole basis for treatment or other patient management decisions.  A negative result may occur with improper specimen collection / handling, submission of specimen other than nasopharyngeal swab, presence of viral mutation(s) within the areas targeted by this assay, and inadequate number of viral copies (<250 copies / mL). A negative result must be combined with clinical observations, patient history, and epidemiological information.  Fact Sheet for Patients:   StrictlyIdeas.no  Fact Sheet for Healthcare Providers: BankingDealers.co.za  This test is not yet approved or  cleared by the [place] FDA and has been authorized for detection and/or diagnosis of SARS-CoV-2 by FDA under an Emergency Use Authorization (EUA).  This EUA will remain in effect (meaning this test can be used) for the duration of the COVID-19  declaration under Section 564(b)(1) of the Act, 21 U.S.C. section 360bbb-3(b)(1), unless the authorization is terminated or revoked sooner.  Performed at [facility], [address] [place], [place] [id number]     Procedures and diagnostic studies:  ECHOCARDIOGRAM COMPLETE  Result Date: 04/29/2020    ECHOCARDIOGRAM REPORT   Patient Name:   Leah Small Date of Exam: 04/29/2020 Medical Rec #:  [id number]       Height:       66.0 in Accession #:    [id number]      Weight:       120.4 lb Date of Birth:  September 16, 1940       BSA:          1.612 m Patient Age:    83 years        BP:           135/80 mmHg Patient Gender: F               HR:           75 bpm. Exam Location:  [facility] Procedure: 2D Echo, Color Doppler, Cardiac Doppler and Intracardiac            Opacification Agent Indications:     I24.9 Acute Coronary Syndrome  History:         Patient has prior history of Echocardiogram examinations, most                  recent 12/29/2019. Previous Myocardial Infarction and CAD, TIA;  Risk Factors:Diabetes, Hypertension and HCL.  Sonographer:     Charmayne Sheer RDCS (AE) Referring Phys:  [id number] Clabe Seal Diagnosing Phys: Isaias Cowman MD  Sonographer Comments: Suboptimal apical window. IMPRESSIONS  1. Left ventricular ejection fraction, by estimation, is 20 to 25%. The left ventricle has severely decreased function. The left ventricle demonstrates regional wall motion abnormalities (see scoring diagram/findings for description). There is mild left  ventricular hypertrophy. Left ventricular diastolic parameters were normal.  2. Right ventricular systolic function is normal. The right ventricular size is normal. There is normal pulmonary artery systolic pressure.  3. The mitral valve is normal in structure. Moderate mitral valve regurgitation. No evidence of mitral stenosis.  4. Tricuspid valve regurgitation is moderate.  5. The aortic valve is normal in structure. Aortic valve  regurgitation is not visualized. No aortic stenosis is present.  6. The inferior vena cava is normal in size with greater than 50% respiratory variability, suggesting right atrial pressure of 3 mmHg. FINDINGS  Left Ventricle: Left ventricular ejection fraction, by estimation, is 20 to 25%. The left ventricle has severely decreased function. The left ventricle demonstrates regional wall motion abnormalities. Definity contrast agent was given IV to delineate the left ventricular endocardial borders. The left ventricular internal cavity size was normal in size. There is mild left ventricular hypertrophy. Left ventricular diastolic parameters were normal.  LV Wall Scoring: The basal inferolateral segment, basal inferior segment, and basal inferoseptal segment are akinetic. The mid inferoseptal segment, apical septal segment, and apex are hypokinetic. Right Ventricle: The right ventricular size is normal. No increase in right ventricular wall thickness. Right ventricular systolic function is normal. There is normal pulmonary artery systolic pressure. The tricuspid regurgitant velocity is 2.38 m/s, and  with an assumed right atrial pressure of 10 mmHg, the estimated right ventricular systolic pressure is 22.9 mmHg. Left Atrium: Left atrial size was normal in size. Right Atrium: Right atrial size was normal in size. Pericardium: There is no evidence of pericardial effusion. Mitral Valve: The mitral valve is normal in structure. Normal mobility of the mitral valve leaflets. Moderate mitral valve regurgitation. No evidence of mitral valve stenosis. MV peak gradient, 6.1 mmHg. The mean mitral valve gradient is 2.0 mmHg. Tricuspid Valve: The tricuspid valve is normal in structure. Tricuspid valve regurgitation is moderate . No evidence of tricuspid stenosis. Aortic Valve: The aortic valve is normal in structure. Aortic valve regurgitation is not visualized. No aortic stenosis is present. Aortic valve mean gradient measures 3.0  mmHg. Aortic valve peak gradient measures 5.9 mmHg. Aortic valve area, by VTI measures 1.24 cm. Pulmonic Valve: The pulmonic valve was normal in structure. Pulmonic valve regurgitation is not visualized. No evidence of pulmonic stenosis. Aorta: The aortic root is normal in size and structure. Venous: The inferior vena cava is normal in size with greater than 50% respiratory variability, suggesting right atrial pressure of 3 mmHg. IAS/Shunts: No atrial level shunt detected by color flow Doppler.  LEFT VENTRICLE PLAX 2D LVIDd:         5.29 cm  Diastology LVIDs:         4.88 cm  LV e' lateral:   7.40 cm/s LV PW:         0.90 cm  LV E/e' lateral: 15.9 LV IVS:        0.69 cm  LV e' medial:    2.61 cm/s LVOT diam:     1.80 cm  LV E/e' medial:  45.2 LV SV:  25 LV SV Index:   16 LVOT Area:     2.54 cm  RIGHT VENTRICLE RV Basal diam:  3.24 cm LEFT ATRIUM             Index       RIGHT ATRIUM           Index LA diam:        3.40 cm 2.11 cm/m  RA Area:     12.70 cm LA Vol (A2C):   50.4 ml 31.27 ml/m RA Volume:   30.90 ml  19.17 ml/m LA Vol (A4C):   56.7 ml 35.18 ml/m LA Biplane Vol: 53.8 ml 33.38 ml/m  AORTIC VALVE                   PULMONIC VALVE AV Area (Vmax):    1.18 cm    PV Vmax:       0.78 m/s AV Area (Vmean):   1.19 cm    PV Vmean:      47.100 cm/s AV Area (VTI):     1.24 cm    PV VTI:        0.114 m AV Vmax:           121.00 cm/s PV Peak grad:  2.4 mmHg AV Vmean:          79.600 cm/s PV Mean grad:  1.0 mmHg AV VTI:            0.204 m AV Peak Grad:      5.9 mmHg AV Mean Grad:      3.0 mmHg LVOT Vmax:         56.10 cm/s LVOT Vmean:        37.200 cm/s LVOT VTI:          0.099 m LVOT/AV VTI ratio: 0.49  AORTA Ao Root diam: 3.00 cm MITRAL VALVE                TRICUSPID VALVE MV Area (PHT): 4.89 cm     TR Peak grad:   22.7 mmHg MV Peak grad:  6.1 mmHg     TR Vmax:        238.00 cm/s MV Mean grad:  2.0 mmHg MV Vmax:       1.23 m/s     SHUNTS MV Vmean:      62.3 cm/s    Systemic VTI:  0.10 m MV Decel Time:  155 msec     Systemic Diam: 1.80 cm MV E velocity: 118.00 cm/s MV A velocity: 43.30 cm/s MV E/A ratio:  2.73 Isaias Cowman MD Electronically signed by Isaias Cowman MD Signature Date/Time: 04/29/2020/12:05:08 PM    Final                LOS: 3 days   Donzel Romack  [facility]   Pager on [email or web address]. If 7PM-7AM, please contact night-coverage at [email or web address]     04/29/2020, 2:08 PM

## 2020-04-29 NOTE — Progress Notes (Signed)
[facility] Cardiology  SUBJECTIVE: Patient laying in bed, denies chest pain, continues to improve clinically   Vitals:   04/28/20 1612 04/28/20 1800 04/29/20 0130 04/29/20 0744  BP: 123/76  135/80 (!) 123/59  Pulse: 75  99 75  Resp:    18  Temp: 98.1 F (36.7 C)  98 F (36.7 C) 98 F (36.7 C)  TempSrc: Oral  Oral Oral  SpO2:  100% 99% 100%  Weight:      Height:         Intake/Output Summary (Last 24 hours) at 04/29/2020 0955 Last data filed at 04/29/2020 6712 Gross per 24 hour  Intake 720 ml  Output 860 ml  Net -140 ml      PHYSICAL EXAM  General: Well developed, well nourished, in no acute distress HEENT:  Normocephalic and atramatic Neck:  No JVD.  Lungs: Clear bilaterally to auscultation and percussion. Heart: HRRR . Normal S1 and S2 without gallops or murmurs.  Abdomen: Bowel sounds are positive, abdomen soft and non-tender  Msk:  Back normal, normal gait. Normal strength and tone for age. Extremities: No clubbing, cyanosis or edema.   Neuro: Alert and oriented X 3. Psych:  Good affect, responds appropriately   LABS: Basic Metabolic Panel: Recent Labs    04/27/20 0450 04/28/20 1101  NA 130* 126*  K 4.5 4.6  CL 104 100  CO2 16* 18*  GLUCOSE 166* 217*  BUN 38* 38*  CREATININE 1.05* 1.28*  CALCIUM 8.6* 8.0*   Liver Function Tests: No results for input(s): AST, ALT, ALKPHOS, BILITOT, PROT, ALBUMIN in the last 72 hours. No results for input(s): LIPASE, AMYLASE in the last 72 hours. CBC: Recent Labs    04/26/20 1134 04/26/20 1134 04/27/20 0450 04/28/20 1101  WBC 4.9   < > 3.5* 3.1*  NEUTROABS 2.9  --   --  1.7  HGB 6.1*   < > 10.0* 9.1*  HCT 17.5*   < > 28.1* 24.8*  MCV 101.7*   < > 93.4 93.6  PLT 35*   < > 24* 42*   < > = values in this interval not displayed.   Cardiac Enzymes: No results for input(s): CKTOTAL, CKMB, CKMBINDEX, TROPONINI in the last 72 hours. BNP: Invalid input(s): POCBNP D-Dimer: No results for input(s): DDIMER in the last 72  hours. Hemoglobin A1C: Recent Labs    04/26/20 1525  HGBA1C 6.8*   Fasting Lipid Panel: No results for input(s): CHOL, HDL, LDLCALC, TRIG, CHOLHDL, LDLDIRECT in the last 72 hours. Thyroid Function Tests: No results for input(s): TSH, T4TOTAL, T3FREE, THYROIDAB in the last 72 hours.  Invalid input(s): FREET3 Anemia Panel: Recent Labs    04/26/20 1525  VITAMINB12 934*  FOLATE 105.0  FERRITIN 5,988*  TIBC 335  IRON 160  RETICCTPCT 1.6    No results found.   Echo pending  TELEMETRY: Sinus rhythm:  ASSESSMENT AND PLAN:  Principal Problem:   Non-STEMI (non-ST elevated myocardial infarction) ([place]) Active Problems:   Diabetes mellitus with complication ([facility])   Essential hypertension   Complex partial seizure disorder ([facility])   Chest pain due to CAD ([place])   Anemia   Severe thrombocytopenia ([place])    1. NSTEMI,with known extensive coronary artery disease, previously not amendable to intervention, with abnormal ECG and elevated hsT 813, 1,246, 5,303, 15,900, in the setting of marked anemia.Patient has chronic angina, though chest painwasworse onpresentation, has since improved after 2 units of PRBCs.  The patient denies chest pain today.  2. Coronary artery  disease, CABG x4 in 1997, NSTEMI 2017, multiple stents, STEMI in 12/2019, treated medically, chronic angina on Ranexa; previous cardiac catheterization in 11/979 complicated by acute respiratory distress requiring intubation with findings overall unchanged from previous cath in 2017, with LVEF 35%, with severe multivessel native disease with occlusion proximal LAD, left circumflex, and RCA, with total occlusion of SVG to RCA, circumflex and diagonal, with patent LIMA to mid LAD.  3. Marked anemia/thrombocytopneia,initialhemoglobin 6.1, platelets 35, heme negative stool, hematologyfollowing and recommending outpatient bone marrow biopsy. No evidence of gross GI bleeding.Hemoglobin improved to 10.0 after 2 units  ofPRBCs, platelets 42  Recommendations  1.  Agree with current therapy 2.  Continue dual antiplatelet therapy 3.  Defer heparin drip 4.  Defer cardiac catheterization 5.  Review 2D echocardiogram 6.  Continue metoprolol succinate, diltiazem, Ranexa, ramipril and atorvastatin 7.  Thrombocytopenia/anemia per hematology, bone marrow biopsy pending   Isaias Cowman, MD, PhD, [facility] 04/29/2020 9:55 AM

## 2020-04-30 ENCOUNTER — Encounter: Payer: Self-pay | Admitting: Internal Medicine

## 2020-04-30 DIAGNOSIS — I214 Non-ST elevation (NSTEMI) myocardial infarction: Principal | ICD-10-CM

## 2020-04-30 LAB — CBC WITH DIFFERENTIAL/PLATELET
Abs Immature Granulocytes: 0.03 10*3/uL (ref 0.00–0.07)
Basophils Absolute: 0 10*3/uL (ref 0.0–0.1)
Basophils Relative: 0 %
Eosinophils Absolute: 0.1 10*3/uL (ref 0.0–0.5)
Eosinophils Relative: 1 %
HCT: 28.7 % — ABNORMAL LOW (ref 36.0–46.0)
Hemoglobin: 10.1 g/dL — ABNORMAL LOW (ref 12.0–15.0)
Immature Granulocytes: 1 %
Lymphocytes Relative: 15 %
Lymphs Abs: 0.7 10*3/uL (ref 0.7–4.0)
MCH: 33.2 pg (ref 26.0–34.0)
MCHC: 35.2 g/dL (ref 30.0–36.0)
MCV: 94.4 fL (ref 80.0–100.0)
Monocytes Absolute: 0.9 10*3/uL (ref 0.1–1.0)
Monocytes Relative: 21 %
Neutro Abs: 2.6 10*3/uL (ref 1.7–7.7)
Neutrophils Relative %: 62 %
Platelets: 130 10*3/uL — ABNORMAL LOW (ref 150–400)
RBC: 3.04 MIL/uL — ABNORMAL LOW (ref 3.87–5.11)
RDW: 18.6 % — ABNORMAL HIGH (ref 11.5–15.5)
WBC: 4.2 10*3/uL (ref 4.0–10.5)
nRBC: 0 % (ref 0.0–0.2)

## 2020-04-30 LAB — COMP PANEL: LEUKEMIA/LYMPHOMA

## 2020-04-30 LAB — BASIC METABOLIC PANEL
Anion gap: 10 (ref 5–15)
BUN: 29 mg/dL — ABNORMAL HIGH (ref 8–23)
CO2: 18 mmol/L — ABNORMAL LOW (ref 22–32)
Calcium: 8.7 mg/dL — ABNORMAL LOW (ref 8.9–10.3)
Chloride: 98 mmol/L (ref 98–111)
Creatinine, Ser: 0.85 mg/dL (ref 0.44–1.00)
GFR calc Af Amer: >60 mL/min (ref >60)
GFR calc non Af Amer: >60 mL/min (ref >60)
Glucose, Bld: 224 mg/dL — ABNORMAL HIGH (ref 70–99)
Potassium: 4.6 mmol/L (ref 3.5–5.1)
Sodium: 126 mmol/L — ABNORMAL LOW (ref 135–145)

## 2020-04-30 LAB — GLUCOSE, CAPILLARY
Glucose-Capillary: 193 mg/dL — ABNORMAL HIGH (ref 70–99)
Glucose-Capillary: 209 mg/dL — ABNORMAL HIGH (ref 70–99)
Glucose-Capillary: 218 mg/dL — ABNORMAL HIGH (ref 70–99)

## 2020-04-30 LAB — PROTIME-INR
INR: 1.3 — ABNORMAL HIGH (ref 0.8–1.2)
Prothrombin Time: 15.2 seconds (ref 11.4–15.2)

## 2020-04-30 NOTE — Progress Notes (Signed)
Mobility Specialist - Progress Note   04/30/20 1409  Mobility  Activity Transferred:  Chair to bed  Level of Assistance Contact guard assist, steadying assist  Assistive Device Front wheel walker  Distance Ambulated (ft) 5 ft  Mobility Response Tolerated well  Mobility performed by Mobility specialist  $Mobility charge 1 Mobility    Pre-mobility: 63 HR, 102/80 BP, 97% SpO2 Post-mobility: 68 HR, 93/55 BP, 94% SpO2   Pt asleep on recliner on RA upon arrival. Pt easily awakens. Pt agrees to transfer to bed d/t feeling tired. Pt S2S w/ CGA. Pt transferred from chair to bed using a RW w/ CGA. After transitioning from stand to sit, pt states she was feeling "lightheaded". However, pt easily recovers by closing her eyes and taking a couple deep breaths. Pt went from sit to supine w/ SBA. Overall, pt tolerated session very well. Pt left asleep in bed w/ alarm set. Call bell and phone placed in reach. Nurse was notified.     Montgomery Favor Mobility Specialist  04/30/20, 2:14 PM

## 2020-04-30 NOTE — H&P (Signed)
Chief Complaint: Pancytopenia  Referring Physician(s): Jennye Boroughs, MD  Supervising Physician: Sandi Mariscal  Patient Status: [place] - In-pt  History of Present Illness: Leah Small is a 79 y.o. female with history of CAD and previous CABG who presented to the ED on 04/26/20 c/o chest pain radiating to her left jaw and left arm.  Other medical issues include history of hypertension, diabetes mellitus, dyslipidemia and seizure disorder.  Labs done while in the Ed showed profound anemia, Hgb 6.1, WBC 3.5, and thrombocytopenia with platelets 35.  She was evaluated by hematology and cardiology.  Hematology is requesting a bone marrow biopsy.  Cardiology (Dr. Saralyn Pilar) feels the patient is stable and low risk for moderate sedation.  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)    Coronary artery disease    Hypercholesterolemia    Hypertension    Myocardial infarction [facility]) 1991; 07/06/2015   NSTEMI (non-ST elevated myocardial infarction) ([place]) 10/22/2015   Psoriatic arthritis ([place])    Seizures ([place])    "complex partial; 1st one was 07/06/2015; might have had one today" (10/22/2015)   TIA (transient ischemic attack)    "several over the last 20 years" (10/22/2015)   Type II diabetes mellitus ([place])     Past Surgical History:  Procedure Laterality Date   ABDOMINAL HYSTERECTOMY  1973   APPENDECTOMY  1950s   elementary school   CARDIAC CATHETERIZATION  X 2-3   CARDIAC CATHETERIZATION N/A 11/21/2015   Procedure: Left Heart Cath and Coronary Angiography;  Surgeon: Jettie Booze, MD;  Location: [place] [facility];  Service: Cardiovascular;  Laterality: N/A;   CARDIAC CATHETERIZATION  11/21/2015   Procedure: Coronary Stent Intervention;  Surgeon: Jettie Booze, MD;  Location: [place] [facility];  Service: Cardiovascular;;   CARDIAC CATHETERIZATION N/A 01/29/2016   Procedure: Left Heart Cath and  Cors/Grafts Angiography;  Surgeon: Sherren Mocha, MD;  Location: [place] [facility];  Service: Cardiovascular;  Laterality: N/A;   CARDIAC CATHETERIZATION N/A 07/22/2016   Procedure: Left Heart Cath and Coronary Angiography;  Surgeon: Wellington Hampshire, MD;  Location: [place] [facility];  Service: Cardiovascular;  Laterality: N/A;   CAROTID STENT INSERTION Bilateral 2011-2012   right-left   CATARACT EXTRACTION W/ INTRAOCULAR LENS  IMPLANT, BILATERAL Bilateral 2009-2010   CHOLECYSTECTOMY OPEN  ~ 2014   CORONARY ANGIOPLASTY     CORONARY ANGIOPLASTY WITH STENT PLACEMENT  11/21/2015   CORONARY ARTERY BYPASS GRAFT  08/1995   CORONARY/GRAFT ACUTE MI REVASCULARIZATION N/A 12/28/2019   Procedure: Coronary/Graft Acute MI Revascularization;  Surgeon: Yolonda Kida, MD;  Location: [place] [facility];  Service: Cardiovascular;  Laterality: N/A;   DILATION AND CURETTAGE OF UTERUS  X 1   LEFT HEART CATH AND CORONARY ANGIOGRAPHY N/A 12/28/2019   Procedure: LEFT HEART CATH AND CORONARY ANGIOGRAPHY;  Surgeon: Yolonda Kida, MD;  Location: [place] [facility];  Service: Cardiovascular;  Laterality: N/A;   THROMBECTOMY FEMORAL ARTERY Right ~ 2015   right femoral artery occlusion/notes 12/13/2014   TONSILLECTOMY  ~ 1947   2nd grade    Allergies: Adhesive [tape], Talwin [pentazocine], Valium [diazepam], Vicodin [hydrocodone-acetaminophen], Beta adrenergic blockers, Levofloxacin, and Simvastatin  Medications: Prior to Admission medications   Medication Sig Start Date End Date Taking? Authorizing Provider  aspirin EC 81 MG tablet Take 81 mg by mouth at bedtime.   Yes [provider]  atorvastatin (LIPITOR) 40 MG tablet Take 40 mg by mouth daily. 12/04/19  Yes [provider]  clopidogrel (PLAVIX) 75 MG tablet Take 1 tablet (75 mg total) by mouth every morning. Patient taking differently: Take 75 mg by mouth daily.  08/14/17  Yes Gladstone Lighter, MD  diltiazem (CARDIZEM CD) 240 MG 24  hr capsule Take 240 mg by mouth daily. 02/12/20  Yes [provider]  glipiZIDE (GLUCOTROL XL) 5 MG 24 hr tablet Take 5 mg by mouth daily with breakfast.   Yes [provider]  lamoTRIgine (LAMICTAL) 100 MG tablet Take 100 mg by mouth daily. 11/13/19  Yes [provider]  levETIRAcetam (KEPPRA) 500 MG tablet Take 500 mg by mouth 2 (two) times daily. 02/29/20  Yes [provider]  metFORMIN (GLUCOPHAGE) 1000 MG tablet Take 1 tablet (1,000 mg total) by mouth 2 (two) times daily. 01/30/16  Yes Theora Gianotti, NP  ramipril (ALTACE) 5 MG capsule Take 5 mg by mouth daily. 10/19/19  Yes [provider]  ranolazine (RANEXA) 500 MG 12 hr tablet Take 1 tablet (500 mg total) by mouth 2 (two) times daily. 08/14/17 04/26/20 Yes Gladstone Lighter, MD  SIMBRINZA 1-0.2 % SUSP Place 1 drop into both eyes 2 (two) times daily.  11/29/19  Yes [provider]  timolol (TIMOPTIC) 0.5 % ophthalmic solution Place 1 drop into both eyes 2 (two) times daily. 06/14/17  Yes [provider]  vitamin B-12 (CYANOCOBALAMIN) 1000 MCG tablet Take 500 mcg by mouth every morning.    Yes [provider]  vitamin E 400 UNIT capsule Take 400 Units by mouth every morning.    Yes [provider]  ascorbic acid (VITAMIN C) 500 MG tablet Take 1 tablet (500 mg total) by mouth daily. Patient not taking: Reported on 04/26/2020 01/04/20   Max Sane, MD  folic acid (FOLVITE) 1 MG tablet Take 1 mg by mouth daily. Patient not taking: Reported on 04/26/2020 03/28/20   [provider]  gabapentin (NEURONTIN) 100 MG capsule Take 100-200 mg by mouth daily as needed. Patient not taking: Reported on 04/26/2020    [provider]  nitroGLYCERIN (NITROSTAT) 0.4 MG SL tablet Place 0.4 mg under the tongue every 5 (five) minutes as needed for chest pain.    [provider]  nystatin cream (MYCOSTATIN) Apply 1 application topically 2 (two) times daily as  needed (yeast infection).  04/25/15   [provider]  triamcinolone ointment (KENALOG) 0.1 % Apply 1 application topically 3 (three) times daily as needed (itching from psoriasis).    [provider]  zinc sulfate 220 (50 Zn) MG capsule Take 1 capsule (220 mg total) by mouth daily. Patient not taking: Reported on 04/26/2020 01/04/20   Max Sane, MD     Family History  Problem Relation Age of Onset   Heart attack Father    Diabetes Mother    Cancer Mother    Stroke Mother    Breast cancer Cousin    Breast cancer Cousin    Breast cancer Cousin     Social History   Socioeconomic History   Marital status: Widowed    Spouse name: Not on file   Number of children: Not on file   Years of education: Not on file   Highest education level: Not on file  Occupational History   Not on file  Tobacco Use   Smoking status: Never Smoker   Smokeless tobacco: Never Used  Substance and Sexual Activity  Alcohol use: No   Drug use: No   Sexual activity: Never  Other Topics Concern   Not on file  Social History Narrative   Lives at home with one of her sons. Independent at baseline.   Social Determinants of Health   Financial Resource Strain:    Difficulty of Paying Living Expenses: Not on file  Food Insecurity:    Worried About Charity fundraiser in the Last Year: Not on file   [employer] of Food in the Last Year: Not on file  Transportation Needs:    Lack of Transportation (Medical): Not on file   Lack of Transportation (Non-Medical): Not on file  Physical Activity:    Days of Exercise per Week: Not on file   Minutes of Exercise per Session: Not on file  Stress:    Feeling of Stress : Not on file  Social Connections:    Frequency of Communication with Friends and Family: Not on file   Frequency of Social Gatherings with Friends and Family: Not on file   Attends Religious Services: Not on file   Active Member of Clubs or Organizations: Not on file   Attends Theatre manager Meetings: Not on file   Marital Status: Not on file     Review of Systems: A 12 point ROS discussed and pertinent positives are indicated in the HPI above.  All other systems are negative.  Review of Systems  Vital Signs: BP 129/73 (BP Location: Left Arm)   Pulse 72   Temp 98 F (36.7 C)   Resp 16   Ht $R'5\' 6"'Ws$  (1.676 m)   Wt 54.6 kg   SpO2 100%   BMI 19.43 kg/m   Physical Exam Vitals reviewed.  Constitutional:      Appearance: Normal appearance.  HENT:     Head: Normocephalic and atraumatic.  Eyes:     Extraocular Movements: Extraocular movements intact.  Cardiovascular:     Rate and Rhythm: Normal rate and regular rhythm.  Pulmonary:     Effort: Pulmonary effort is normal. No respiratory distress.     Breath sounds: Normal breath sounds.  Abdominal:     General: There is no distension.     Palpations: Abdomen is soft.     Tenderness: There is no abdominal tenderness.  Musculoskeletal:        General: Normal range of motion.  Skin:    General: Skin is warm and dry.  Neurological:     General: No focal deficit present.     Mental Status: She is alert and oriented to person, place, and time.  Psychiatric:        Mood and Affect: Mood normal.        Behavior: Behavior normal.        Thought Content: Thought content normal.        Judgment: Judgment normal.     Imaging: DG Chest Portable 1 View  Result Date: 04/26/2020 CLINICAL DATA:  Chest pain that started this morning pain all over to LEFT jaw and LEFT arm EXAM: PORTABLE CHEST 1 VIEW COMPARISON:  Dec 29, 2019 FINDINGS: Pacer defibrillator pad overlies the central chest and LEFT lower chest. Trachea midline. Heart size remains enlarged post median sternotomy for CABG. Hilar structures are stable. Lungs are clear.  No sign of pleural effusion. No acute skeletal process to the extent evaluated. IMPRESSION: No effusion or consolidation. Stable findings of postoperative changes related to median sternotomy  and CABG. Electronically Signed  By: Zetta Bills M.D.   On: 04/26/2020 11:53   ECHOCARDIOGRAM COMPLETE  Result Date: 04/29/2020    ECHOCARDIOGRAM REPORT   Patient Name:   SYEDA PRICKETT Date of Exam: 04/29/2020 Medical Rec #:  [id number]       Height:       66.0 in Accession #:    [id number]      Weight:       120.4 lb Date of Birth:  May 02, 1941       BSA:          1.612 m Patient Age:    73 years        BP:           135/80 mmHg Patient Gender: F               HR:           75 bpm. Exam Location:  [facility] Procedure: 2D Echo, Color Doppler, Cardiac Doppler and Intracardiac            Opacification Agent Indications:     I24.9 Acute Coronary Syndrome  History:         Patient has prior history of Echocardiogram examinations, most                  recent 12/29/2019. Previous Myocardial Infarction and CAD, TIA;                  Risk Factors:Diabetes, Hypertension and HCL.  Sonographer:     Charmayne Sheer RDCS (AE) Referring Phys:  [id number] Clabe Seal Diagnosing Phys: Isaias Cowman MD  Sonographer Comments: Suboptimal apical window. IMPRESSIONS  1. Left ventricular ejection fraction, by estimation, is 20 to 25%. The left ventricle has severely decreased function. The left ventricle demonstrates regional wall motion abnormalities (see scoring diagram/findings for description). There is mild left  ventricular hypertrophy. Left ventricular diastolic parameters were normal.  2. Right ventricular systolic function is normal. The right ventricular size is normal. There is normal pulmonary artery systolic pressure.  3. The mitral valve is normal in structure. Moderate mitral valve regurgitation. No evidence of mitral stenosis.  4. Tricuspid valve regurgitation is moderate.  5. The aortic valve is normal in structure. Aortic valve regurgitation is not visualized. No aortic stenosis is present.  6. The inferior vena cava is normal in size with greater than 50% respiratory variability, suggesting right atrial pressure of 3 mmHg.  FINDINGS  Left Ventricle: Left ventricular ejection fraction, by estimation, is 20 to 25%. The left ventricle has severely decreased function. The left ventricle demonstrates regional wall motion abnormalities. Definity contrast agent was given IV to delineate the left ventricular endocardial borders. The left ventricular internal cavity size was normal in size. There is mild left ventricular hypertrophy. Left ventricular diastolic parameters were normal.  LV Wall Scoring: The basal inferolateral segment, basal inferior segment, and basal inferoseptal segment are akinetic. The mid inferoseptal segment, apical septal segment, and apex are hypokinetic. Right Ventricle: The right ventricular size is normal. No increase in right ventricular wall thickness. Right ventricular systolic function is normal. There is normal pulmonary artery systolic pressure. The tricuspid regurgitant velocity is 2.38 m/s, and  with an assumed right atrial pressure of 10 mmHg, the estimated right ventricular systolic pressure is 40.8 mmHg. Left Atrium: Left atrial size was normal in size. Right Atrium: Right atrial size was normal in size. Pericardium: There is no evidence of pericardial effusion. Mitral Valve: The mitral valve is normal in  structure. Normal mobility of the mitral valve leaflets. Moderate mitral valve regurgitation. No evidence of mitral valve stenosis. MV peak gradient, 6.1 mmHg. The mean mitral valve gradient is 2.0 mmHg. Tricuspid Valve: The tricuspid valve is normal in structure. Tricuspid valve regurgitation is moderate . No evidence of tricuspid stenosis. Aortic Valve: The aortic valve is normal in structure. Aortic valve regurgitation is not visualized. No aortic stenosis is present. Aortic valve mean gradient measures 3.0 mmHg. Aortic valve peak gradient measures 5.9 mmHg. Aortic valve area, by VTI measures 1.24 cm. Pulmonic Valve: The pulmonic valve was normal in structure. Pulmonic valve regurgitation is not  visualized. No evidence of pulmonic stenosis. Aorta: The aortic root is normal in size and structure. Venous: The inferior vena cava is normal in size with greater than 50% respiratory variability, suggesting right atrial pressure of 3 mmHg. IAS/Shunts: No atrial level shunt detected by color flow Doppler.  LEFT VENTRICLE PLAX 2D LVIDd:         5.29 cm  Diastology LVIDs:         4.88 cm  LV e' lateral:   7.40 cm/s LV PW:         0.90 cm  LV E/e' lateral: 15.9 LV IVS:        0.69 cm  LV e' medial:    2.61 cm/s LVOT diam:     1.80 cm  LV E/e' medial:  45.2 LV SV:         25 LV SV Index:   16 LVOT Area:     2.54 cm  RIGHT VENTRICLE RV Basal diam:  3.24 cm LEFT ATRIUM             Index       RIGHT ATRIUM           Index LA diam:        3.40 cm 2.11 cm/m  RA Area:     12.70 cm LA Vol (A2C):   50.4 ml 31.27 ml/m RA Volume:   30.90 ml  19.17 ml/m LA Vol (A4C):   56.7 ml 35.18 ml/m LA Biplane Vol: 53.8 ml 33.38 ml/m  AORTIC VALVE                   PULMONIC VALVE AV Area (Vmax):    1.18 cm    PV Vmax:       0.78 m/s AV Area (Vmean):   1.19 cm    PV Vmean:      47.100 cm/s AV Area (VTI):     1.24 cm    PV VTI:        0.114 m AV Vmax:           121.00 cm/s PV Peak grad:  2.4 mmHg AV Vmean:          79.600 cm/s PV Mean grad:  1.0 mmHg AV VTI:            0.204 m AV Peak Grad:      5.9 mmHg AV Mean Grad:      3.0 mmHg LVOT Vmax:         56.10 cm/s LVOT Vmean:        37.200 cm/s LVOT VTI:          0.099 m LVOT/AV VTI ratio: 0.49  AORTA Ao Root diam: 3.00 cm MITRAL VALVE                TRICUSPID VALVE MV Area (PHT): 4.89 cm  TR Peak grad:   22.7 mmHg MV Peak grad:  6.1 mmHg     TR Vmax:        238.00 cm/s MV Mean grad:  2.0 mmHg MV Vmax:       1.23 m/s     SHUNTS MV Vmean:      62.3 cm/s    Systemic VTI:  0.10 m MV Decel Time: 155 msec     Systemic Diam: 1.80 cm MV E velocity: 118.00 cm/s MV A velocity: 43.30 cm/s MV E/A ratio:  2.73 Isaias Cowman MD Electronically signed by Isaias Cowman MD Signature  Date/Time: 04/29/2020/12:05:08 PM    Final     Labs:  CBC: Recent Labs    04/26/20 1134 04/27/20 0450 04/28/20 1101 04/29/20 1032  WBC 4.9 3.5* 3.1* 3.4*  HGB 6.1* 10.0* 9.1* 8.9*  HCT 17.5* 28.1* 24.8* 25.8*  PLT 35* 24* 42* 70*    COAGS: Recent Labs    12/28/19 1601 04/26/20 1525 04/30/20 0602  INR 1.2 1.1 1.3*  APTT >160* 32  --     BMP: Recent Labs    04/26/20 1134 04/27/20 0450 04/28/20 1101 04/29/20 1032  NA 129* 130* 126* 128*  K 5.1 4.5 4.6 4.7  CL 100 104 100 100  CO2 14* 16* 18* 20*  GLUCOSE 294* 166* 217* 228*  BUN 48* 38* 38* 32*  CALCIUM 8.9 8.6* 8.0* 8.1*  CREATININE 1.42* 1.05* 1.28* 0.97  GFRNONAA 35* 50* 39* 55*  GFRAA 40* 58* 46* >60    LIVER FUNCTION TESTS: Recent Labs    12/29/19 0106 12/30/19 0721 12/31/19 0123 01/01/20 0441  BILITOT 0.9 0.7 0.5 0.7  AST 48* 44* 30 22  ALT 31 35 29 19  ALKPHOS 73 64 61 62  PROT 7.8 7.4 7.3 6.2*  ALBUMIN 4.0 3.7 3.7 3.1*    TUMOR MARKERS: No results for input(s): AFPTM, CEA, CA199, CHROMGRNA in the last 8760 hours.  Assessment and Plan:  Chest pain /NSTEMI, likely secondary to profound anemia - symptoms improved with transfusion.  Will proceed with bone marrow biopsy tomorrow by Dr. Pascal Lux.  Risks and benefits of bone marrow biopsy was discussed with the patient and/or patient's family including, but not limited to bleeding, infection, damage to adjacent structures or low yield requiring additional tests.  All of the questions were answered and there is agreement to proceed.  Consent signed and in chart.  Thank you for this interesting consult.  I greatly enjoyed meeting DORRINE MONTONE and look forward to participating in their care.  A copy of this report was sent to the requesting provider on this date.  Electronically Signed: Murrell Redden, PA-C   04/30/2020, 9:00 AM      I spent a total of 20 Minutes in face to face in clinical consultation, greater than 50% of which was  counseling/coordinating care for bone marrow biopsy.

## 2020-04-30 NOTE — Progress Notes (Signed)
Progress Note    Leah Small  QMG:[id number] DOB: [birth year]  DOA: 04/26/2020 PCP: Idelle Crouch, MD      Brief Narrative:    Medical records reviewed and are as summarized below:  Leah Small is a 79 y.o. female with medical history significant for coronary artery disease status post CABG, status post stent angioplasty, history of hypertension, diabetes mellitus, dyslipidemia and seizure disorder who presented to the ED via EMS for evaluation of chest pain.   Her troponins were significantly elevated (peaked at 15,900).  She was admitted to the hospital for acute NSTEMI.  She also had severe anemia and thrombocytopenia with hemoglobin of 6.1 and platelet count of 35,000 respectively.  She was transfused with 2 units of packed red blood cells and H&H have remained stable.  She was not treated with IV heparin because of anemia and thrombocytopenia.  She was on aspirin and Plavix prior to admission but these were held temporarily.  Both aspirin and Plavix have been resumed since anemia and thrombocytopenia have improved.  She was seen in consultation by ID hematologist who recommended bone marrow biopsy for further evaluation.  She was also seen by the cardiologist for acute NSTEMI.  She was started on midodrine for hypotension.       Assessment/Plan:   Principal Problem:   Non-STEMI (non-ST elevated myocardial infarction) ([place]) Active Problems:   Diabetes mellitus with complication [facility])   Essential hypertension   Complex partial seizure disorder (HCC)   Chest pain due to CAD ([place])   Anemia   Severe thrombocytopenia (HCC)    Acute NSTEMI, multivessel CAD with CABG - Troponin peaked at 15,900 Severe anemia Severe thrombocytopenia Type II DM Hypotension but has history of essential hypertension Seizure disorder Elevated vitamin B12 level (934) History of stroke Chronic systolic CHF (EF less than 20% in April 2021) Hyponatremia-likely chronic from  CHF  PLAN  2D echo showed EF of 20 to 25%, Moderate MR. Continue midodrine. Hemoglobin improved s/p transfusion with 2 units of PRBCs on 04/26/2020 Platelet count has improved significantly. Continue aspirin Plavix Continue metoprolol Ramipril and Cardizem on hold because of hypotension Continue antiepileptics Bone marrow biopsy has been rescheduled for tomorrow. Plan discussed with the patient.   Body mass index is 19.43 kg/m.  Diet Order            Diet NPO time specified  Diet effective midnight           Diet Heart Room service appropriate? Yes; Fluid consistency: Thin  Diet effective now                    Consultants:  Cardiologist  Hematologist  Procedures:  None    Medications:   . sodium chloride   Intravenous Once  . aspirin EC  81 mg Oral Daily  . atorvastatin  40 mg Oral Daily  . brinzolamide  1 drop Both Eyes BID   And  . brimonidine  1 drop Both Eyes BID  . clopidogrel  75 mg Oral Daily  . folic acid  1 mg Oral Daily  . insulin aspart  0-15 Units Subcutaneous TID WC  . lamoTRIgine  100 mg Oral Daily  . levETIRAcetam  500 mg Oral BID  . mouth rinse  15 mL Mouth Rinse BID  . metoprolol succinate  25 mg Oral Daily  . midodrine  5 mg Oral TID WC  . ranolazine  1,000 mg Oral BID  . sodium  chloride flush  3 mL Intravenous Q12H  . timolol  1 drop Both Eyes BID  . vitamin B-12  500 mcg Oral BH-q7a  . vitamin E  400 Units Oral BH-q7a   Continuous Infusions: . sodium chloride    . sodium chloride    . sodium chloride       Anti-infectives (From admission, onward)   None             Family Communication/Anticipated D/C date and plan/Code Status   DVT prophylaxis: SCDs Start: 04/26/20 1428     Code Status: DNR. Discussed with patient. She sad she's DNR. Witnessed by Burman Nieves, RN.  Family Communication: Plan discussed with patient. Disposition Plan:    Status is: Inpatient  Remains inpatient appropriate because:Inpatient  level of care appropriate due to severity of illness   Dispo: The patient is from: Home              Anticipated d/c is to: Home              Anticipated d/c date is: 1 day              Patient currently is not medically stable to d/c.           Subjective:   Interval events noted.  No complaints.  No shortness of breath, dizziness or chest pain.  She feels better today.  She was able to work with PT today.  Objective:    Vitals:   04/29/20 2043 04/30/20 0523 04/30/20 0748 04/30/20 1130  BP: 111/62 (!) 102/52 129/73 (!) 103/54  Pulse: 69 63 72 60  Resp: $Remo'17 17 16 16  'nkUMs$ Temp: 97.6 F (36.4 C) 97.6 F (36.4 C) 98 F (36.7 C) 97.6 F (36.4 C)  TempSrc: Oral Oral  Oral  SpO2: 100% 100% 100% 100%  Weight:      Height:       No data found.   Intake/Output Summary (Last 24 hours) at 04/30/2020 1726 Last data filed at 04/30/2020 1340 Gross per 24 hour  Intake 480 ml  Output 600 ml  Net -120 ml   Filed Weights   04/26/20 1116 04/27/20 0453 04/28/20 0338  Weight: 56.7 kg 51.8 kg 54.6 kg    Exam:  GEN: NAD SKIN: No rash EYES: No pallor or icterus ENT: MMM CV: RRR PULM: CTA B ABD: soft, ND, NT, +BS CNS: AAO x 3, non focal EXT: No edema or tenderness    Data Reviewed:   I have personally reviewed following labs and imaging studies:  Labs: Labs show the following:   Basic Metabolic Panel: Recent Labs  Lab 04/26/20 1134 04/26/20 1134 04/27/20 0450 04/27/20 0450 04/28/20 1101 04/28/20 1101 04/29/20 1032 04/30/20 1009  NA 129*  --  130*  --  126*  --  128* 126*  K 5.1   < > 4.5   < > 4.6   < > 4.7 4.6  CL 100  --  104  --  100  --  100 98  CO2 14*  --  16*  --  18*  --  20* 18*  GLUCOSE 294*  --  166*  --  217*  --  228* 224*  BUN 48*  --  38*  --  38*  --  32* 29*  CREATININE 1.42*  --  1.05*  --  1.28*  --  0.97 0.85  CALCIUM 8.9  --  8.6*  --  8.0*  --  8.1* 8.7*   < > =  values in this interval not displayed.   GFR Estimated Creatinine  Clearance: 45.5 mL/min (by C-G formula based on SCr of 0.85 mg/dL). Liver Function Tests: No results for input(s): AST, ALT, ALKPHOS, BILITOT, PROT, ALBUMIN in the last 168 hours. No results for input(s): LIPASE, AMYLASE in the last 168 hours. No results for input(s): AMMONIA in the last 168 hours. Coagulation profile Recent Labs  Lab 04/26/20 1525 04/30/20 0602  INR 1.1 1.3*    CBC: Recent Labs  Lab 04/26/20 1134 04/27/20 0450 04/28/20 1101 04/29/20 1032 04/30/20 1009  WBC 4.9 3.5* 3.1* 3.4* 4.2  NEUTROABS 2.9  --  1.7 2.1 2.6  HGB 6.1* 10.0* 9.1* 8.9* 10.1*  HCT 17.5* 28.1* 24.8* 25.8* 28.7*  MCV 101.7* 93.4 93.6 95.2 94.4  PLT 35* 24* 42* 70* 130*   Cardiac Enzymes: No results for input(s): CKTOTAL, CKMB, CKMBINDEX, TROPONINI in the last 168 hours. BNP (last 3 results) No results for input(s): PROBNP in the last 8760 hours. CBG: Recent Labs  Lab 04/29/20 1616 04/29/20 2225 04/30/20 0745 04/30/20 1130 04/30/20 1701  GLUCAP 220* 223* 193* 209* 218*   D-Dimer: No results for input(s): DDIMER in the last 72 hours. Hgb A1c: No results for input(s): HGBA1C in the last 72 hours. Lipid Profile: No results for input(s): CHOL, HDL, LDLCALC, TRIG, CHOLHDL, LDLDIRECT in the last 72 hours. Thyroid function studies: No results for input(s): TSH, T4TOTAL, T3FREE, THYROIDAB in the last 72 hours.  Invalid input(s): FREET3 Anemia work up: No results for input(s): VITAMINB12, FOLATE, FERRITIN, TIBC, IRON, RETICCTPCT in the last 72 hours. Sepsis Labs: Recent Labs  Lab 04/27/20 0450 04/28/20 1101 04/29/20 1032 04/30/20 1009  WBC 3.5* 3.1* 3.4* 4.2    Microbiology Recent Results (from the past 240 hour(s))  SARS Coronavirus 2 by RT PCR (hospital order, performed in [facility] hospital lab) Nasopharyngeal Nasopharyngeal Swab     Status: None   Collection Time: 04/26/20 11:34 AM   Specimen: Nasopharyngeal Swab  Result Value Ref Range Status   SARS Coronavirus 2  NEGATIVE NEGATIVE Final    Comment: (NOTE) SARS-CoV-2 target nucleic acids are NOT DETECTED.  The SARS-CoV-2 RNA is generally detectable in upper and lower respiratory specimens during the acute phase of infection. The lowest concentration of SARS-CoV-2 viral copies this assay can detect is 250 copies / mL. A negative result does not preclude SARS-CoV-2 infection and should not be used as the sole basis for treatment or other patient management decisions.  A negative result may occur with improper specimen collection / handling, submission of specimen other than nasopharyngeal swab, presence of viral mutation(s) within the areas targeted by this assay, and inadequate number of viral copies (<250 copies / mL). A negative result must be combined with clinical observations, patient history, and epidemiological information.  Fact Sheet for Patients:   StrictlyIdeas.no  Fact Sheet for Healthcare Providers: BankingDealers.co.za  This test is not yet approved or  cleared by the [place] FDA and has been authorized for detection and/or diagnosis of SARS-CoV-2 by FDA under an Emergency Use Authorization (EUA).  This EUA will remain in effect (meaning this test can be used) for the duration of the COVID-19 declaration under Section 564(b)(1) of the Act, 21 U.S.C. section 360bbb-3(b)(1), unless the authorization is terminated or revoked sooner.  Performed at [facility], [address] [place], [place] [address]     Procedures and diagnostic studies:  ECHOCARDIOGRAM COMPLETE  Result Date: 04/29/2020    ECHOCARDIOGRAM REPORT  Patient Name:   RAINBOW SALMAN Date of Exam: 04/29/2020 Medical Rec #:  [id number]       Height:       66.0 in Accession #:    [id number]      Weight:       120.4 lb Date of Birth:  07/19/1941       BSA:          1.612 m Patient Age:    64 years        BP:           135/80 mmHg Patient Gender: F                HR:           75 bpm. Exam Location:  [facility] Procedure: 2D Echo, Color Doppler, Cardiac Doppler and Intracardiac            Opacification Agent Indications:     I24.9 Acute Coronary Syndrome  History:         Patient has prior history of Echocardiogram examinations, most                  recent 12/29/2019. Previous Myocardial Infarction and CAD, TIA;                  Risk Factors:Diabetes, Hypertension and HCL.  Sonographer:     Charmayne Sheer RDCS (AE) Referring Phys:  [id number] Clabe Seal Diagnosing Phys: Isaias Cowman MD  Sonographer Comments: Suboptimal apical window. IMPRESSIONS  1. Left ventricular ejection fraction, by estimation, is 20 to 25%. The left ventricle has severely decreased function. The left ventricle demonstrates regional wall motion abnormalities (see scoring diagram/findings for description). There is mild left  ventricular hypertrophy. Left ventricular diastolic parameters were normal.  2. Right ventricular systolic function is normal. The right ventricular size is normal. There is normal pulmonary artery systolic pressure.  3. The mitral valve is normal in structure. Moderate mitral valve regurgitation. No evidence of mitral stenosis.  4. Tricuspid valve regurgitation is moderate.  5. The aortic valve is normal in structure. Aortic valve regurgitation is not visualized. No aortic stenosis is present.  6. The inferior vena cava is normal in size with greater than 50% respiratory variability, suggesting right atrial pressure of 3 mmHg. FINDINGS  Left Ventricle: Left ventricular ejection fraction, by estimation, is 20 to 25%. The left ventricle has severely decreased function. The left ventricle demonstrates regional wall motion abnormalities. Definity contrast agent was given IV to delineate the left ventricular endocardial borders. The left ventricular internal cavity size was normal in size. There is mild left ventricular hypertrophy. Left ventricular diastolic parameters were normal.  LV Wall  Scoring: The basal inferolateral segment, basal inferior segment, and basal inferoseptal segment are akinetic. The mid inferoseptal segment, apical septal segment, and apex are hypokinetic. Right Ventricle: The right ventricular size is normal. No increase in right ventricular wall thickness. Right ventricular systolic function is normal. There is normal pulmonary artery systolic pressure. The tricuspid regurgitant velocity is 2.38 m/s, and  with an assumed right atrial pressure of 10 mmHg, the estimated right ventricular systolic pressure is 99.2 mmHg. Left Atrium: Left atrial size was normal in size. Right Atrium: Right atrial size was normal in size. Pericardium: There is no evidence of pericardial effusion. Mitral Valve: The mitral valve is normal in structure. Normal mobility of the mitral valve leaflets. Moderate mitral valve regurgitation. No evidence of mitral valve stenosis. MV peak gradient, 6.1 mmHg. The  mean mitral valve gradient is 2.0 mmHg. Tricuspid Valve: The tricuspid valve is normal in structure. Tricuspid valve regurgitation is moderate . No evidence of tricuspid stenosis. Aortic Valve: The aortic valve is normal in structure. Aortic valve regurgitation is not visualized. No aortic stenosis is present. Aortic valve mean gradient measures 3.0 mmHg. Aortic valve peak gradient measures 5.9 mmHg. Aortic valve area, by VTI measures 1.24 cm. Pulmonic Valve: The pulmonic valve was normal in structure. Pulmonic valve regurgitation is not visualized. No evidence of pulmonic stenosis. Aorta: The aortic root is normal in size and structure. Venous: The inferior vena cava is normal in size with greater than 50% respiratory variability, suggesting right atrial pressure of 3 mmHg. IAS/Shunts: No atrial level shunt detected by color flow Doppler.  LEFT VENTRICLE PLAX 2D LVIDd:         5.29 cm  Diastology LVIDs:         4.88 cm  LV e' lateral:   7.40 cm/s LV PW:         0.90 cm  LV E/e' lateral: 15.9 LV IVS:         0.69 cm  LV e' medial:    2.61 cm/s LVOT diam:     1.80 cm  LV E/e' medial:  45.2 LV SV:         25 LV SV Index:   16 LVOT Area:     2.54 cm  RIGHT VENTRICLE RV Basal diam:  3.24 cm LEFT ATRIUM             Index       RIGHT ATRIUM           Index LA diam:        3.40 cm 2.11 cm/m  RA Area:     12.70 cm LA Vol (A2C):   50.4 ml 31.27 ml/m RA Volume:   30.90 ml  19.17 ml/m LA Vol (A4C):   56.7 ml 35.18 ml/m LA Biplane Vol: 53.8 ml 33.38 ml/m  AORTIC VALVE                   PULMONIC VALVE AV Area (Vmax):    1.18 cm    PV Vmax:       0.78 m/s AV Area (Vmean):   1.19 cm    PV Vmean:      47.100 cm/s AV Area (VTI):     1.24 cm    PV VTI:        0.114 m AV Vmax:           121.00 cm/s PV Peak grad:  2.4 mmHg AV Vmean:          79.600 cm/s PV Mean grad:  1.0 mmHg AV VTI:            0.204 m AV Peak Grad:      5.9 mmHg AV Mean Grad:      3.0 mmHg LVOT Vmax:         56.10 cm/s LVOT Vmean:        37.200 cm/s LVOT VTI:          0.099 m LVOT/AV VTI ratio: 0.49  AORTA Ao Root diam: 3.00 cm MITRAL VALVE                TRICUSPID VALVE MV Area (PHT): 4.89 cm     TR Peak grad:   22.7 mmHg MV Peak grad:  6.1 mmHg     TR Vmax:  238.00 cm/s MV Mean grad:  2.0 mmHg MV Vmax:       1.23 m/s     SHUNTS MV Vmean:      62.3 cm/s    Systemic VTI:  0.10 m MV Decel Time: 155 msec     Systemic Diam: 1.80 cm MV E velocity: 118.00 cm/s MV A velocity: 43.30 cm/s MV E/A ratio:  2.73 Isaias Cowman MD Electronically signed by Isaias Cowman MD Signature Date/Time: 04/29/2020/12:05:08 PM    Final                LOS: 4 days   Srinika Delone  [facility]   Pager on [email or web address]. If 7PM-7AM, please contact night-coverage at [email or web address]     04/30/2020, 5:26 PM

## 2020-04-30 NOTE — Progress Notes (Signed)
[facility] Cardiology    SUBJECTIVE: The patient reports feeling well this morning. She took a shower this morning, feels much better. She has mild chest tightness, which is her baseline, with no worsening chest pain or shortness of breath.    Vitals:   04/29/20 1630 04/29/20 2043 04/30/20 0523 04/30/20 0748  BP: 113/71 111/62 (!) 102/52 129/73  Pulse: 70 69 63 72  Resp: _0 Temp: 97.6 F (36.4 C) 97.6 F (36.4 C) 97.6 F (36.4 C) 98 F (36.7 C)  TempSrc: Oral Oral Oral   SpO2: 100% 100% 100% 100%  Weight:      Height:         Intake/Output Summary (Last 24 hours) at 04/30/2020 0859 Last data filed at 04/30/2020 0748 Gross per 24 hour  Intake 720 ml  Output 600 ml  Net 120 ml      PHYSICAL EXAM   General:79 elderly female lying flat in bedin no acute distress HEENT:bruising around R eye Neck: No JVD.  Lungs: Clear bilaterally to auscultation, normal effort of breathing on supplemental oxygen Heart:HRRR . Normal S1 and S2 without gallops or murmurs.  Abdomen:nondistended Msk: Back normal,gait not assessed. No obvious deformities. Extremities:No clubbing, cyanosis or edema.  Neuro: Alert and oriented X 3. Psych: Good affect, responds appropriately  Skin: Pale, dry, no diaphoresis  LABS: Basic Metabolic Panel: Recent Labs    04/28/20 1101 04/29/20 1032  NA 126* 128*  K 4.6 4.7  CL 100 100  CO2 18* 20*  GLUCOSE 217* 228*  BUN 38* 32*  CREATININE 1.28* 0.97  CALCIUM 8.0* 8.1*   Liver Function Tests: No results for input(s): AST, ALT, ALKPHOS, BILITOT, PROT, ALBUMIN in the last 72 hours. No results for input(s): LIPASE, AMYLASE in the last 72 hours. CBC: Recent Labs    04/28/20 1101 04/29/20 1032  WBC 3.1* 3.4*  NEUTROABS 1.7 2.1  HGB 9.1* 8.9*  HCT 24.8* 25.8*  MCV 93.6 95.2  PLT 42* 70*   Cardiac Enzymes: No results for input(s): CKTOTAL, CKMB, CKMBINDEX, TROPONINI in the last 72 hours. BNP: Invalid input(s):  POCBNP D-Dimer: No results for input(s): DDIMER in the last 72 hours. Hemoglobin A1C: No results for input(s): HGBA1C in the last 72 hours. Fasting Lipid Panel: No results for input(s): CHOL, HDL, LDLCALC, TRIG, CHOLHDL, LDLDIRECT in the last 72 hours. Thyroid Function Tests: No results for input(s): TSH, T4TOTAL, T3FREE, THYROIDAB in the last 72 hours.  Invalid input(s): FREET3 Anemia Panel: No results for input(s): VITAMINB12, FOLATE, FERRITIN, TIBC, IRON, RETICCTPCT in the last 72 hours.  ECHOCARDIOGRAM COMPLETE  Result Date: 04/29/2020    ECHOCARDIOGRAM REPORT   Patient Name:   Leah Small Date of Exam: 04/29/2020 Medical Rec #:  [id number]       Height:       66.0 in Accession #:    [id number]      Weight:       120.4 lb Date of Birth:  Aug 21, 1941       BSA:          1.612 m Patient Age:    79 years        BP:           135/80 mmHg Patient Gender: F               HR:           75 bpm. Exam Location:  [facility] Procedure: 2D Echo, Color Doppler, Cardiac Doppler and Intracardiac  Opacification Agent Indications:     I24.9 Acute Coronary Syndrome  History:         Patient has prior history of Echocardiogram examinations, most                  recent 12/29/2019. Previous Myocardial Infarction and CAD, TIA;                  Risk Factors:Diabetes, Hypertension and HCL.  Sonographer:     Charmayne Sheer RDCS (AE) Referring Phys:  [id number] Clabe Seal Diagnosing Phys: Isaias Cowman MD  Sonographer Comments: Suboptimal apical window. IMPRESSIONS  1. Left ventricular ejection fraction, by estimation, is 20 to 25%. The left ventricle has severely decreased function. The left ventricle demonstrates regional wall motion abnormalities (see scoring diagram/findings for description). There is mild left  ventricular hypertrophy. Left ventricular diastolic parameters were normal.  2. Right ventricular systolic function is normal. The right ventricular size is normal. There is normal pulmonary artery systolic  pressure.  3. The mitral valve is normal in structure. Moderate mitral valve regurgitation. No evidence of mitral stenosis.  4. Tricuspid valve regurgitation is moderate.  5. The aortic valve is normal in structure. Aortic valve regurgitation is not visualized. No aortic stenosis is present.  6. The inferior vena cava is normal in size with greater than 50% respiratory variability, suggesting right atrial pressure of 3 mmHg. FINDINGS  Left Ventricle: Left ventricular ejection fraction, by estimation, is 20 to 25%. The left ventricle has severely decreased function. The left ventricle demonstrates regional wall motion abnormalities. Definity contrast agent was given IV to delineate the left ventricular endocardial borders. The left ventricular internal cavity size was normal in size. There is mild left ventricular hypertrophy. Left ventricular diastolic parameters were normal.  LV Wall Scoring: The basal inferolateral segment, basal inferior segment, and basal inferoseptal segment are akinetic. The mid inferoseptal segment, apical septal segment, and apex are hypokinetic. Right Ventricle: The right ventricular size is normal. No increase in right ventricular wall thickness. Right ventricular systolic function is normal. There is normal pulmonary artery systolic pressure. The tricuspid regurgitant velocity is 2.38 m/s, and  with an assumed right atrial pressure of 10 mmHg, the estimated right ventricular systolic pressure is 67.6 mmHg. Left Atrium: Left atrial size was normal in size. Right Atrium: Right atrial size was normal in size. Pericardium: There is no evidence of pericardial effusion. Mitral Valve: The mitral valve is normal in structure. Normal mobility of the mitral valve leaflets. Moderate mitral valve regurgitation. No evidence of mitral valve stenosis. MV peak gradient, 6.1 mmHg. The mean mitral valve gradient is 2.0 mmHg. Tricuspid Valve: The tricuspid valve is normal in structure. Tricuspid valve  regurgitation is moderate . No evidence of tricuspid stenosis. Aortic Valve: The aortic valve is normal in structure. Aortic valve regurgitation is not visualized. No aortic stenosis is present. Aortic valve mean gradient measures 3.0 mmHg. Aortic valve peak gradient measures 5.9 mmHg. Aortic valve area, by VTI measures 1.24 cm. Pulmonic Valve: The pulmonic valve was normal in structure. Pulmonic valve regurgitation is not visualized. No evidence of pulmonic stenosis. Aorta: The aortic root is normal in size and structure. Venous: The inferior vena cava is normal in size with greater than 50% respiratory variability, suggesting right atrial pressure of 3 mmHg. IAS/Shunts: No atrial level shunt detected by color flow Doppler.  LEFT VENTRICLE PLAX 2D LVIDd:         5.29 cm  Diastology LVIDs:  4.88 cm  LV e' lateral:   7.40 cm/s LV PW:         0.90 cm  LV E/e' lateral: 15.9 LV IVS:        0.69 cm  LV e' medial:    2.61 cm/s LVOT diam:     1.80 cm  LV E/e' medial:  45.2 LV SV:         25 LV SV Index:   16 LVOT Area:     2.54 cm  RIGHT VENTRICLE RV Basal diam:  3.24 cm LEFT ATRIUM             Index       RIGHT ATRIUM           Index LA diam:        3.40 cm 2.11 cm/m  RA Area:     12.70 cm LA Vol (A2C):   50.4 ml 31.27 ml/m RA Volume:   30.90 ml  19.17 ml/m LA Vol (A4C):   56.7 ml 35.18 ml/m LA Biplane Vol: 53.8 ml 33.38 ml/m  AORTIC VALVE                   PULMONIC VALVE AV Area (Vmax):    1.18 cm    PV Vmax:       0.78 m/s AV Area (Vmean):   1.19 cm    PV Vmean:      47.100 cm/s AV Area (VTI):     1.24 cm    PV VTI:        0.114 m AV Vmax:           121.00 cm/s PV Peak grad:  2.4 mmHg AV Vmean:          79.600 cm/s PV Mean grad:  1.0 mmHg AV VTI:            0.204 m AV Peak Grad:      5.9 mmHg AV Mean Grad:      3.0 mmHg LVOT Vmax:         56.10 cm/s LVOT Vmean:        37.200 cm/s LVOT VTI:          0.099 m LVOT/AV VTI ratio: 0.49  AORTA Ao Root diam: 3.00 cm MITRAL VALVE                TRICUSPID VALVE  MV Area (PHT): 4.89 cm     TR Peak grad:   22.7 mmHg MV Peak grad:  6.1 mmHg     TR Vmax:        238.00 cm/s MV Mean grad:  2.0 mmHg MV Vmax:       1.23 m/s     SHUNTS MV Vmean:      62.3 cm/s    Systemic VTI:  0.10 m MV Decel Time: 155 msec     Systemic Diam: 1.80 cm MV E velocity: 118.00 cm/s MV A velocity: 43.30 cm/s MV E/A ratio:  2.73 Isaias Cowman MD Electronically signed by Isaias Cowman MD Signature Date/Time: 04/29/2020/12:05:08 PM    Final      Echo LVEF 20-25%, akinetic basal inferolateral, basal inferior, basal inferoseptal, and hypokinesis of mid inferoseptal, apical septal, and apex.  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 NSTEMI,with known extensive coronary artery disease, previously not amendable to intervention, with abnormal ECG and elevated hsT 813,1,246,5,303,15,900, in the setting of marked anemia.Patient has chronic angina, though chest painwasworse onpresentation, has since improved after 2 units of PRBCs.The patient denies chest pain today.  2. Coronary artery disease, CABG x4 in 1997, NSTEMI 2017, multiple stents, STEMI in 12/2019, treated medically, chronic angina on Ranexa; previous cardiac catheterization in 09/3015 complicated by acute respiratory distress requiring intubation with findings overall unchanged from previous cath in 2017, with LVEF 35%, with severe multivessel native disease with occlusion proximal LAD,leftcircumflex, and RCA, with total occlusion of SVG to RCA, circumflex and diagonal, with patent LIMA to mid LAD.  3. Marked anemia/thrombocytopneia,initialhemoglobin 6.1, platelets 35, heme negative stool, hematologyfollowing and  recommending bone marrow biopsy. No evidence of gross GI bleeding.Hemoglobin improved to 10.0 after 2 units ofPRBCs, platelets 42, now hemoglobin is 8.9, platelets 70.  4. Hyponatremia, sodium 128  5. Hypotension, improved with addition of midodrine, ramipril and diltiazem on hold   Recommendations: 1. Continue metoprolol succinate, Ranexa, and atorvastatin 2. Bone marrow biopsy pending; patient may proceed and is considered a low risk for serious cardiovascular complications during this low risk procedure. 3. Continue aspirin and Plavix  4. Defer initiating Entresto due to hypotension; could be considered as outpatient.    Clabe Seal, PA-C 04/30/2020 8:59 AM Discussed with Dr. Saralyn Pilar.

## 2020-04-30 NOTE — Progress Notes (Signed)
Hematology/Oncology Consult note [facility]  [phone] [phone]  Patient Care Team: Idelle Crouch, MD as PCP - General (Internal Medicine) Sindy Guadeloupe, MD as Consulting Physician (Oncology)   Name of the patient: Leah Small  [id number]  11-May-1941   Date of visit: 04/30/2020   Interval history-patient denies any chest pain presently. She also worked with physical therapy and sat up on her chair. Denies any bleeding or bruising.  ECOG PS- 3 Pain scale- 0  Review of systems- Review of Systems  Constitutional: Positive for malaise/fatigue. Negative for chills, fever and weight loss.  HENT: Negative for congestion, ear discharge and nosebleeds.   Eyes: Negative for blurred vision.  Respiratory: Positive for shortness of breath. Negative for cough, hemoptysis, sputum production and wheezing.   Cardiovascular: Negative for chest pain, palpitations, orthopnea and claudication.  Gastrointestinal: Negative for abdominal pain, blood in stool, constipation, diarrhea, heartburn, melena, nausea and vomiting.  Genitourinary: Negative for dysuria, flank pain, frequency, hematuria and urgency.  Musculoskeletal: Negative for back pain, joint pain and myalgias.  Skin: Negative for rash.  Neurological: Negative for dizziness, tingling, focal weakness, seizures, weakness and headaches.  Endo/Heme/Allergies: Does not bruise/bleed easily.  Psychiatric/Behavioral: Negative for depression and suicidal ideas. The patient does not have insomnia.       Allergies  Allergen Reactions  . Adhesive [Tape] Other (See Comments)    Bruising and TEARING!!!  Please use "paper" tape  . Talwin [Pentazocine] Anaphylaxis and Swelling    Throat swelling  . Valium [Diazepam] Other (See Comments)    Makes her feel like she's flying  . Vicodin [Hydrocodone-Acetaminophen] Nausea And Vomiting  . Beta Adrenergic Blockers Other (See Comments)    Pt states she  not allergic  . Levofloxacin Swelling and Other (See Comments)    Tongue swells   . Simvastatin Swelling and Rash    Mouth swelling     Past Medical History:  Diagnosis Date  . Acid reflux   . Arthritis    "all over"  . 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 Coronary artery disease   . Hypercholesterolemia   . Hypertension   . Myocardial infarction ([place]) 1991; 07/06/2015  . NSTEMI (non-ST elevated myocardial infarction) ([place]) 10/22/2015  . Psoriatic arthritis ([place])   . Seizures ([place])    "complex partial; 1st one was 07/06/2015; might have had one today" (10/22/2015)  . TIA (transient ischemic attack)    "several over the last 20 years" (10/22/2015)  . Type II diabetes mellitus ([place])      Past Surgical History:  Procedure Laterality Date  . ABDOMINAL HYSTERECTOMY  1973  . APPENDECTOMY  1950s   elementary school  . CARDIAC CATHETERIZATION  X 2-3  . CARDIAC CATHETERIZATION N/A 11/21/2015   Procedure: Left Heart Cath and Coronary Angiography;  Surgeon: Jettie Booze, MD;  Location: [place] [facility];  Service: Cardiovascular;  Laterality: N/A;  . CARDIAC CATHETERIZATION  11/21/2015   Procedure: Coronary Stent Intervention;  Surgeon: Jettie Booze, MD;  Location: [place] [facility];  Service: Cardiovascular;;  . CARDIAC CATHETERIZATION N/A 01/29/2016   Procedure: Left Heart Cath and Cors/Grafts Angiography;  Surgeon: Sherren Mocha, MD;  Location: [place] [facility];  Service: Cardiovascular;  Laterality: N/A;  . CARDIAC CATHETERIZATION N/A 07/22/2016   Procedure: Left Heart Cath and Coronary Angiography;  Surgeon: Wellington Hampshire, MD;  Location: [place] [facility];  Service: Cardiovascular;  Laterality: N/A;  . CAROTID STENT INSERTION  Bilateral 2011-2012   right-left  . CATARACT EXTRACTION W/ INTRAOCULAR LENS  IMPLANT, BILATERAL Bilateral 2009-2010  . CHOLECYSTECTOMY OPEN  ~ 2014  . CORONARY ANGIOPLASTY    . CORONARY ANGIOPLASTY WITH STENT PLACEMENT  11/21/2015  . CORONARY  ARTERY BYPASS GRAFT  08/1995  . CORONARY/GRAFT ACUTE MI REVASCULARIZATION N/A 12/28/2019   Procedure: Coronary/Graft Acute MI Revascularization;  Surgeon: Callwood, Dwayne D, MD;  Location: [facility];  Service: Cardiovascular;  Laterality: N/A;  . DILATION AND CURETTAGE OF UTERUS  X 1  . LEFT HEART CATH AND CORONARY ANGIOGRAPHY N/A 12/28/2019   Procedure: LEFT HEART CATH AND CORONARY ANGIOGRAPHY;  Surgeon: Callwood, Dwayne D, MD;  Location: [facility];  Service: Cardiovascular;  Laterality: N/A;  . 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 TONSILLECTOMY  ~ 1947   2nd grade    Social History   Socioeconomic History  . Marital status: Widowed    Spouse name: Not on file  . Number of children: Not on file  . Years of education: Not on file  . Highest education level: Not on file  Occupational History  . Not on file  Tobacco Use  . Smoking status: Never Smoker  . Smokeless tobacco: Never Used  Substance and Sexual Activity  . Alcohol use: No  . Drug use: No  . Sexual activity: Never  Other Topics Concern  . Not on file  Social History Narrative   Lives at home with one of her sons. Independent at baseline.   Social Determinants of Health   Financial Resource Strain:   . Difficulty of Paying Living Expenses: Not on file  Food Insecurity:   . Worried About Running Out of Food in the Last Year: Not on file  . Ran Out of Food in the Last Year: Not on file  Transportation Needs:   . Lack of Transportation (Medical): Not on file  . Lack of Transportation (Non-Medical): Not on file  Physical Activity:   . Days of Exercise per Week: Not on file  . Minutes of Exercise per Session: Not on file  Stress:   . Feeling of Stress : Not on file  Social Connections:   . Frequency of Communication with Friends and Family: Not on file  . Frequency of Social Gatherings with Friends and Family: Not on file  . Attends  Religious Services: Not on file  . Active Member of Clubs or Organizations: Not on file  . Attends Club or Organization Meetings: Not on file  . Marital Status: Not on file  Intimate Partner Violence:   . Fear of Current or Ex-Partner: Not on file  . Emotionally Abused: Not on file  . Physically Abused: Not on file  . Sexually Abused: Not on file    Family History  Problem Relation Age of Onset  . Heart attack Father   . Diabetes Mother   . Cancer Mother   . Stroke Mother   . Breast cancer Cousin   . Breast cancer Cousin   . Breast cancer Cousin      Current Facility-Administered Medications:  .  0.9 %  sodium chloride infusion (Manually program via Guardrails IV Fluids), , Intravenous, Once, Agbata, Tochukwu, MD .  0.9 %  sodium chloride infusion, 10 mL/hr, Intravenous, Once, Jessup, Charles, MD .  0.9 %  sodium chloride infusion, 250 mL, Intravenous, PRN, Agbata, Tochukwu, MD .  acetaminophen (TYLENOL) tablet 650 mg, 650 mg, Oral,   Q4H PRN, Agbata, Tochukwu, MD, 650 mg at 04/27/20 0410 .  aspirin EC tablet 81 mg, 81 mg, Oral, Daily, Jennye Boroughs, MD, 81 mg at 04/30/20 1026 .  atorvastatin (LIPITOR) tablet 40 mg, 40 mg, Oral, Daily, Agbata, Tochukwu, MD, 40 mg at 04/29/20 2113 .  brinzolamide (AZOPT) 1 % ophthalmic suspension 1 drop, 1 drop, Both Eyes, BID, 1 drop at 04/30/20 1027 **AND** brimonidine (ALPHAGAN) 0.2 % ophthalmic solution 1 drop, 1 drop, Both Eyes, BID, Jennye Boroughs, MD, 1 drop at 04/30/20 1027 .  clopidogrel (PLAVIX) tablet 75 mg, 75 mg, Oral, Daily, Jennye Boroughs, MD, 75 mg at 65/46/50 3546 .  folic acid (FOLVITE) tablet 1 mg, 1 mg, Oral, Daily, Agbata, Tochukwu, MD, 1 mg at 04/30/20 1026 .  gabapentin (NEURONTIN) capsule 100 mg, 100 mg, Oral, Daily PRN, Agbata, Tochukwu, MD .  insulin aspart (novoLOG) injection 0-15 Units, 0-15 Units, Subcutaneous, TID WC, Agbata, Tochukwu, MD, 5 Units at 04/30/20 1204 .  lamoTRIgine (LAMICTAL) tablet 100 mg, 100 mg, Oral,  Daily, Agbata, Tochukwu, MD, 100 mg at 04/29/20 2115 .  levETIRAcetam (KEPPRA) tablet 500 mg, 500 mg, Oral, BID, Agbata, Tochukwu, MD, 500 mg at 04/30/20 1026 .  magnesium hydroxide (MILK OF MAGNESIA) suspension 30 mL, 30 mL, Oral, Daily PRN, Jennye Boroughs, MD, 30 mL at 04/28/20 0641 .  MEDLINE mouth rinse, 15 mL, Mouth Rinse, BID, Jennye Boroughs, MD, 15 mL at 04/30/20 1027 .  metoprolol succinate (TOPROL-XL) 24 hr tablet 25 mg, 25 mg, Oral, Daily, Jennye Boroughs, MD, 25 mg at 04/30/20 1026 .  midodrine (PROAMATINE) tablet 5 mg, 5 mg, Oral, TID WC, Jennye Boroughs, MD, 5 mg at 04/30/20 1204 .  nitroGLYCERIN (NITROSTAT) SL tablet 0.4 mg, 0.4 mg, Sublingual, Q5 Min x 3 PRN, Agbata, Tochukwu, MD .  ondansetron (ZOFRAN) injection 4 mg, 4 mg, Intravenous, Q6H PRN, Agbata, Tochukwu, MD, 4 mg at 04/27/20 5681 .  ranolazine (RANEXA) 12 hr tablet 1,000 mg, 1,000 mg, Oral, BID, Clabe Seal, PA-C, 1,000 mg at 04/30/20 1027 .  sodium chloride 0.9 % bolus 500 mL, 500 mL, Intravenous, Once, [place], Bing Neighbors, NP .  sodium chloride flush (NS) 0.9 % injection 3 mL, 3 mL, Intravenous, Q12H, Agbata, Tochukwu, MD, 3 mL at 04/30/20 1028 .  sodium chloride flush (NS) 0.9 % injection 3 mL, 3 mL, Intravenous, PRN, Agbata, Tochukwu, MD, 3 mL at 04/27/20 0950 .  timolol (TIMOPTIC) 0.5 % ophthalmic solution 1 drop, 1 drop, Both Eyes, BID, Agbata, Tochukwu, MD, 1 drop at 04/30/20 0852 .  triamcinolone ointment (KENALOG) 0.1 % 1 application, 1 application, Topical, TID PRN, Agbata, Tochukwu, MD .  vitamin B-12 (CYANOCOBALAMIN) tablet 500 mcg, 500 mcg, Oral, BH-q7a, Agbata, Tochukwu, MD, 500 mcg at 04/30/20 0852 .  vitamin E capsule 400 Units, 400 Units, Oral, BH-q7a, Agbata, Tochukwu, MD, 400 Units at 04/30/20 0852  Physical exam:  Vitals:   04/29/20 2043 04/30/20 0523 04/30/20 0748 04/30/20 1130  BP: 111/62 (!) 102/52 129/73 (!) 103/54  Pulse: 69 63 72 60  Resp: _0 Temp: 97.6 F (36.4 C) 97.6 F (36.4  C) 98 F (36.7 C) 97.6 F (36.4 C)  TempSrc: Oral Oral  Oral  SpO2: 100% 100% 100% 100%  Weight:      Height:       Physical Exam Constitutional:      Comments: Frail elderly woman who appears fatigued. She is on 2 L of oxygen  Cardiovascular:     Rate and Rhythm:  Normal rate and regular rhythm.     Heart sounds: Normal heart sounds.  Pulmonary:     Effort: Pulmonary effort is normal.     Comments: Breath sounds decreased over bases Abdominal:     General: Bowel sounds are normal.     Palpations: Abdomen is soft.  Skin:    General: Skin is warm and dry.  Neurological:     Mental Status: She is alert and oriented to person, place, and time.      CMP Latest Ref Rng & Units 04/30/2020  Glucose 70 - 99 mg/dL 224(H)  BUN 8 - 23 mg/dL 29(H)  Creatinine 0.44 - 1.00 mg/dL 0.85  Sodium 135 - 145 mmol/L 126(L)  Potassium 3.5 - 5.1 mmol/L 4.6  Chloride 98 - 111 mmol/L 98  CO2 22 - 32 mmol/L 18(L)  Calcium 8.9 - 10.3 mg/dL 8.7(L)  Total Protein 6.5 - 8.1 g/dL -  Total Bilirubin 0.3 - 1.2 mg/dL -  Alkaline Phos 38 - 126 U/L -  AST 15 - 41 U/L -  ALT 0 - 44 U/L -   CBC Latest Ref Rng & Units 04/30/2020  WBC 4.0 - 10.5 K/uL 4.2  Hemoglobin 12.0 - 15.0 g/dL 10.1(L)  Hematocrit 36 - 46 % 28.7(L)  Platelets 150 - 400 K/uL 130(L)    _0 @  DG Chest Portable 1 View  Result Date: 04/26/2020 CLINICAL DATA:  Chest pain that started this morning pain all over to LEFT jaw and LEFT arm EXAM: PORTABLE CHEST 1 VIEW COMPARISON:  Dec 29, 2019 FINDINGS: Pacer defibrillator pad overlies the central chest and LEFT lower chest. Trachea midline. Heart size remains enlarged post median sternotomy for CABG. Hilar structures are stable. Lungs are clear.  No sign of pleural effusion. No acute skeletal process to the extent evaluated. IMPRESSION: No effusion or consolidation. Stable findings of postoperative changes related to median sternotomy and CABG. Electronically Signed   By: Zetta Bills M.D.    On: 04/26/2020 11:53   ECHOCARDIOGRAM COMPLETE  Result Date: 04/29/2020    ECHOCARDIOGRAM REPORT   Patient Name:   NASHAY BRICKLEY Date of Exam: 04/29/2020 Medical Rec #:  [id number]       Height:       66.0 in Accession #:    [id number]      Weight:       120.4 lb Date of Birth:  [birth year]       BSA:          1.612 m Patient Age:    24 years        BP:           135/80 mmHg Patient Gender: F               HR:           75 bpm. Exam Location:  [facility] Procedure: 2D Echo, Color Doppler, Cardiac Doppler and Intracardiac            Opacification Agent Indications:     I24.9 Acute Coronary Syndrome  History:         Patient has prior history of Echocardiogram examinations, most                  recent 12/29/2019. Previous Myocardial Infarction and CAD, TIA;                  Risk Factors:Diabetes, Hypertension and HCL.  Sonographer:     Charmayne Sheer RDCS (AE) Referring Phys:  [id number] ANNA DRANE Diagnosing Phys: Alexander Paraschos MD  Sonographer Comments: Suboptimal apical window. IMPRESSIONS  1. Left ventricular ejection fraction, by estimation, is 20 to 25%. The left ventricle has severely decreased function. The left ventricle demonstrates regional wall motion abnormalities (see scoring diagram/findings for description). There is mild left  ventricular hypertrophy. Left ventricular diastolic parameters were normal.  2. Right ventricular systolic function is normal. The right ventricular size is normal. There is normal pulmonary artery systolic pressure.  3. The mitral valve is normal in structure. Moderate mitral valve regurgitation. No evidence of mitral stenosis.  4. Tricuspid valve regurgitation is moderate.  5. The aortic valve is normal in structure. Aortic valve regurgitation is not visualized. No aortic stenosis is present.  6. The inferior vena cava is normal in size with greater than 50% respiratory variability, suggesting right atrial pressure of 3 mmHg. FINDINGS  Left Ventricle: Left ventricular ejection  fraction, by estimation, is 20 to 25%. The left ventricle has severely decreased function. The left ventricle demonstrates regional wall motion abnormalities. Definity contrast agent was given IV to delineate the left ventricular endocardial borders. The left ventricular internal cavity size was normal in size. There is mild left ventricular hypertrophy. Left ventricular diastolic parameters were normal.  LV Wall Scoring: The basal inferolateral segment, basal inferior segment, and basal inferoseptal segment are akinetic. The mid inferoseptal segment, apical septal segment, and apex are hypokinetic. Right Ventricle: The right ventricular size is normal. No increase in right ventricular wall thickness. Right ventricular systolic function is normal. There is normal pulmonary artery systolic pressure. The tricuspid regurgitant velocity is 2.38 m/s, and  with an assumed right atrial pressure of 10 mmHg, the estimated right ventricular systolic pressure is 32.7 mmHg. Left Atrium: Left atrial size was normal in size. Right Atrium: Right atrial size was normal in size. Pericardium: There is no evidence of pericardial effusion. Mitral Valve: The mitral valve is normal in structure. Normal mobility of the mitral valve leaflets. Moderate mitral valve regurgitation. No evidence of mitral valve stenosis. MV peak gradient, 6.1 mmHg. The mean mitral valve gradient is 2.0 mmHg. Tricuspid Valve: The tricuspid valve is normal in structure. Tricuspid valve regurgitation is moderate . No evidence of tricuspid stenosis. Aortic Valve: The aortic valve is normal in structure. Aortic valve regurgitation is not visualized. No aortic stenosis is present. Aortic valve mean gradient measures 3.0 mmHg. Aortic valve peak gradient measures 5.9 mmHg. Aortic valve area, by VTI measures 1.24 cm. Pulmonic Valve: The pulmonic valve was normal in structure. Pulmonic valve regurgitation is not visualized. No evidence of pulmonic stenosis. Aorta: The  aortic root is normal in size and structure. Venous: The inferior vena cava is normal in size with greater than 50% respiratory variability, suggesting right atrial pressure of 3 mmHg. IAS/Shunts: No atrial level shunt detected by color flow Doppler.  LEFT VENTRICLE PLAX 2D LVIDd:         5.29 cm  Diastology LVIDs:         4.88 cm  LV e' lateral:   7.40 cm/s LV PW:         0.90 cm  LV E/e' lateral: 15.9 LV IVS:        0.69 cm  LV e' medial:    2.61 cm/s LVOT diam:     1.80 cm  LV E/e' medial:  45.2 LV SV:         25 LV SV Index:   16 LVOT Area:     2.54 cm    RIGHT VENTRICLE RV Basal diam:  3.24 cm LEFT ATRIUM             Index       RIGHT ATRIUM           Index LA diam:        3.40 cm 2.11 cm/m  RA Area:     12.70 cm LA Vol (A2C):   50.4 ml 31.27 ml/m RA Volume:   30.90 ml  19.17 ml/m LA Vol (A4C):   56.7 ml 35.18 ml/m LA Biplane Vol: 53.8 ml 33.38 ml/m  AORTIC VALVE                   PULMONIC VALVE AV Area (Vmax):    1.18 cm    PV Vmax:       0.78 m/s AV Area (Vmean):   1.19 cm    PV Vmean:      47.100 cm/s AV Area (VTI):     1.24 cm    PV VTI:        0.114 m AV Vmax:           121.00 cm/s PV Peak grad:  2.4 mmHg AV Vmean:          79.600 cm/s PV Mean grad:  1.0 mmHg AV VTI:            0.204 m AV Peak Grad:      5.9 mmHg AV Mean Grad:      3.0 mmHg LVOT Vmax:         56.10 cm/s LVOT Vmean:        37.200 cm/s LVOT VTI:          0.099 m LVOT/AV VTI ratio: 0.49  AORTA Ao Root diam: 3.00 cm MITRAL VALVE                TRICUSPID VALVE MV Area (PHT): 4.89 cm     TR Peak grad:   22.7 mmHg MV Peak grad:  6.1 mmHg     TR Vmax:        238.00 cm/s MV Mean grad:  2.0 mmHg MV Vmax:       1.23 m/s     SHUNTS MV Vmean:      62.3 cm/s    Systemic VTI:  0.10 m MV Decel Time: 155 msec     Systemic Diam: 1.80 cm MV E velocity: 118.00 cm/s MV A velocity: 43.30 cm/s MV E/A ratio:  2.73 Alexander Paraschos MD Electronically signed by Alexander Paraschos MD Signature Date/Time: 04/29/2020/12:05:08 PM    Final      Assessment  and plan- Patient is a 79 y.o. female admitted for chest pain found to have significant pancytopenia  Pancytopenia: Patient's platelet count dropped to 124 but since then has gradually gone up to 130 without any intervention. She has not received any platelet transfusions. In terms of her anemia she has been intermittently dropping her hemoglobin to less than 7 requiring blood transfusions over the last 6 months. Peripheral blood anemia work-up was unremarkable except for a component of iron deficiency which was corrected as an outpatient. Her hemoglobin improved to 9.1 in August 2021 and then back down to 6.1 when she was admitted to the hospital. She received a unit of PRBC transfusion and her hemoglobin is up to 10.1 again.   My initial concern was a primary bone marrow disorder given her significant thrombocytopenia. However given that there has been no recovery in her platelet   count we may not be dealing with MDS or leukemia as such. However given the repeated bouts of anemia recently it is reasonable to get a bone marrow biopsy at this time to rule that out. She is scheduled for a bone marrow tomorrow. I will follow up on the results and discuss everything in detail with her and her son as an outpatient   Visit Diagnosis 1. Nonspecific chest pain   2. Anemia, unspecified type   3. Thrombocytopenia ([place])   4. Pancytopenia ([place])      Dr. Randa Evens, MD, MPH [facility] [phone] 04/30/2020 3:42 PM

## 2020-04-30 NOTE — Evaluation (Signed)
Physical Therapy Evaluation Patient Details Name: Leah Small MRN: [id number] DOB: [birth year] Today's Date: 04/30/2020   History of Present Illness  79 y.o. female with medical history significant for coronary artery disease status post CABG, status post stent angioplasty, history of hypertension, diabetes mellitus, dyslipidemia and seizure disorder who presented to the ED via EMS for evaluation of chest pain.  Patient states that she has frequent anginal episodes for which she takes nitroglycerin but this morning she developed chest pain at rest which she rated 10 x 10 in intensity at its worst.  Pain radiated to her left jaw and left arm and was associated with nausea and vomiting.  Admitted with NSTEMI.  Clinical Impression  Pt very pleasant and eager to work with PT.  She reports she has had some ataxia with ambulation since prior TIAs.  Pt was on 2L on arrival with sats at 100%, maintained high 90s on room air during in bed and ambulation activity, reported to nursing that sats remained stable on room air.  Overall pt reports not being too far from her baseline, does not want STR and should be able to manage at home but reinforced that she ought to not being alone at home for 8+ hrs while son is at work.  She reports that family should be able to at least do some check-ins.      Follow Up Recommendations Home health PT;Supervision for mobility/OOB (discussed not being home 8 hrs w/o assist)    Equipment Recommendations  None recommended by PT    Recommendations for Other Services       Precautions / Restrictions Precautions Precautions: Fall Restrictions Weight Bearing Restrictions: No      Mobility  Bed Mobility Overal bed mobility: Modified Independent             General bed mobility comments: needed rails, but able to attain sitting EOB w/o assist  Transfers Overall transfer level: Modified independent Equipment used: Rolling walker (2 wheeled)              General transfer comment: Pt unable to standing on her own on first 2 attempts, cues to use UEs appropriately (no both on walker) and she was able to rise w/o assist  Ambulation/Gait Ambulation/Gait assistance: Supervision Gait Distance (Feet): 25 Feet Assistive device: Rolling walker (2 wheeled)       General Gait Details: Pt with very slow and deliberate stop-go cadence.  No LOBs and no exessive fatigue, though reports some "lightheaded tiredness" and requests to sit after modest distance.  Stairs            Wheelchair Mobility    Modified Rankin (Stroke Patients Only)       Balance Overall balance assessment: Modified Independent;History of Falls (reports 3 falls in last 6 months (usually "blacking out"))                                           Pertinent Vitals/Pain Pain Assessment: No/denies pain    Home Living Family/patient expects to be discharged to:: Private residence Living Arrangements: Children Available Help at Discharge: Family;Available PRN/intermittently (son (and his girlfriend) out of home for work 8+hr/day) Type of Home: House Home Access: Ramped entrance     Home Layout: One level Home Equipment: Environmental consultant - 2 wheels;Cane - single point;Shower seat;Walker - 4 wheels      Prior Function Level  of Independence: Independent with assistive device(s)         Comments: Mod indep with 4WRW for ADLs (sponge bath) and household mobilization; has stopped cooking, household chores as needed. No home O2.  Endorses multiple fall history, most recent 4 weeks prior.     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)    Lower Extremity Assessment Lower Extremity Assessment: Generalized weakness (age appropriate defecits)       Communication   Communication: No difficulties  Cognition  Arousal/Alertness: Awake/alert Behavior During Therapy: WFL for tasks assessed/performed Overall Cognitive Status: Within Functional Limits for tasks assessed                                        General Comments      Exercises     Assessment/Plan    PT Assessment Patient needs continued PT services  PT Problem List Decreased strength;Decreased activity tolerance;Decreased balance;Decreased mobility;Decreased knowledge of use of DME;Decreased safety awareness;Cardiopulmonary status limiting activity       PT Treatment Interventions DME instruction;Gait training;Functional mobility training;Therapeutic activities;Therapeutic exercise;Balance training;Neuromuscular re-education;Patient/family education    PT Goals (Current goals can be found in the Care Plan section)  Acute Rehab PT Goals Patient Stated Goal: go home PT Goal Formulation: With patient Time For Goal Achievement: 05/14/20 Potential to Achieve Goals: Good    Frequency Min 2X/week   Barriers to discharge        Co-evaluation               AM-PAC PT "6 Clicks" Mobility  Outcome Measure Help needed turning from your back to your side while in a flat bed without using bedrails?: None Help needed moving from lying on your back to sitting on the side of a flat bed without using bedrails?: None Help needed moving to and from a bed to a chair (including a wheelchair)?: None Help needed standing up from a chair using your arms (e.g., wheelchair or bedside chair)?: A Little Help needed to walk in hospital room?: A Little Help needed climbing 3-5 steps with a railing? : A Little 6 Click Score: 21    End of Session Equipment Utilized During Treatment: Gait belt Activity Tolerance: Patient tolerated treatment well;Patient limited by fatigue Patient left: with chair alarm set;with call bell/phone within reach Nurse Communication: Mobility status (O2 remained in high 90s on room air t/o  session) PT Visit Diagnosis: Muscle weakness (generalized) (M62.81);Difficulty in walking, not elsewhere classified (R26.2)    Time: 0902-0929 PT Time Calculation (min) (ACUTE ONLY): 27 min   Charges:   PT Evaluation $PT Eval Low Complexity: 1 Low PT Treatments $Gait Training: 8-22 mins        Malachi Pro, DPT 04/30/2020, 10:50 AM

## 2020-05-01 ENCOUNTER — Inpatient Hospital Stay: Payer: Medicare Other

## 2020-05-01 ENCOUNTER — Other Ambulatory Visit: Payer: Self-pay

## 2020-05-01 LAB — CBC WITH DIFFERENTIAL/PLATELET
Abs Immature Granulocytes: 0.04 10*3/uL (ref 0.00–0.07)
Basophils Absolute: 0 10*3/uL (ref 0.0–0.1)
Basophils Relative: 0 %
Eosinophils Absolute: 0.1 10*3/uL (ref 0.0–0.5)
Eosinophils Relative: 3 %
HCT: 27.7 % — ABNORMAL LOW (ref 36.0–46.0)
Hemoglobin: 9.7 g/dL — ABNORMAL LOW (ref 12.0–15.0)
Immature Granulocytes: 1 %
Lymphocytes Relative: 15 %
Lymphs Abs: 0.8 10*3/uL (ref 0.7–4.0)
MCH: 33.4 pg (ref 26.0–34.0)
MCHC: 35 g/dL (ref 30.0–36.0)
MCV: 95.5 fL (ref 80.0–100.0)
Monocytes Absolute: 1.1 10*3/uL — ABNORMAL HIGH (ref 0.1–1.0)
Monocytes Relative: 22 %
Neutro Abs: 3 10*3/uL (ref 1.7–7.7)
Neutrophils Relative %: 59 %
Platelets: 195 10*3/uL (ref 150–400)
RBC: 2.9 MIL/uL — ABNORMAL LOW (ref 3.87–5.11)
RDW: 18.6 % — ABNORMAL HIGH (ref 11.5–15.5)
WBC: 5.1 10*3/uL (ref 4.0–10.5)
nRBC: 0 % (ref 0.0–0.2)

## 2020-05-01 LAB — BASIC METABOLIC PANEL
Anion gap: 10 (ref 5–15)
BUN: 32 mg/dL — ABNORMAL HIGH (ref 8–23)
CO2: 17 mmol/L — ABNORMAL LOW (ref 22–32)
Calcium: 8.7 mg/dL — ABNORMAL LOW (ref 8.9–10.3)
Chloride: 99 mmol/L (ref 98–111)
Creatinine, Ser: 0.94 mg/dL (ref 0.44–1.00)
GFR calc Af Amer: >60 mL/min (ref >60)
GFR calc non Af Amer: 57 mL/min — ABNORMAL LOW (ref >60)
Glucose, Bld: 169 mg/dL — ABNORMAL HIGH (ref 70–99)
Potassium: 4.4 mmol/L (ref 3.5–5.1)
Sodium: 126 mmol/L — ABNORMAL LOW (ref 135–145)

## 2020-05-01 LAB — GLUCOSE, CAPILLARY
Glucose-Capillary: 174 mg/dL — ABNORMAL HIGH (ref 70–99)
Glucose-Capillary: 205 mg/dL — ABNORMAL HIGH (ref 70–99)
Glucose-Capillary: 236 mg/dL — ABNORMAL HIGH (ref 70–99)
Glucose-Capillary: 237 mg/dL — ABNORMAL HIGH (ref 70–99)

## 2020-05-01 MED ORDER — MIDAZOLAM HCL 2 MG/2ML IJ SOLN
INTRAMUSCULAR | Status: DC | PRN
  Administered 2020-05-01: 0.5 mg via INTRAVENOUS

## 2020-05-01 MED ORDER — MIDAZOLAM HCL 2 MG/2ML IJ SOLN
INTRAMUSCULAR | Status: AC
  Filled 2020-05-01: qty 2

## 2020-05-01 MED ORDER — FENTANYL CITRATE (PF) 100 MCG/2ML IJ SOLN
INTRAMUSCULAR | Status: AC
  Filled 2020-05-01: qty 2

## 2020-05-01 MED ORDER — HEPARIN SOD (PORK) LOCK FLUSH 100 UNIT/ML IV SOLN
INTRAVENOUS | Status: AC
  Filled 2020-05-01: qty 5

## 2020-05-01 MED ORDER — FENTANYL CITRATE (PF) 100 MCG/2ML IJ SOLN
INTRAMUSCULAR | Status: DC | PRN
  Administered 2020-05-01: 25 ug via INTRAVENOUS

## 2020-05-01 NOTE — Procedures (Signed)
Pre-procedure Diagnosis: Anemia of uncertain etiology Post-procedure Diagnosis: Same  Technically successful CT guided bone marrow aspiration and biopsy of left iliac crest.   Complications: None Immediate  EBL: None  Signed: Simonne Come Pager: [phone] 05/01/2020, 10:26 AM

## 2020-05-01 NOTE — Progress Notes (Signed)
PT Cancellation Note  Patient Details Name: Leah Small MRN: [id number] DOB: [birth year]   Cancelled Treatment:    Reason Eval/Treat Not Completed: Other (comment) (Pt sleeping heavily upon entering room, family requested PT to allow her to sleep and re-attempt later if able. Family encouraged to motivate pt later to sit in chair for dinner, verbalized understanding.)   Olga Coaster PT, DPT 2:38 PM,05/01/20

## 2020-05-01 NOTE — Progress Notes (Signed)
PROGRESS NOTE    Leah Small   ATF:[id number]  DOB: December 15, 1940  PCP: Idelle Crouch, MD    DOA: 04/26/2020 LOS: 5   Brief Narrative   Leah Small is a 79 y.o. female with medical history significant for coronary artery disease status post CABG, status post stent angioplasty, history of hypertension, diabetes mellitus, dyslipidemia and seizure disorder who presented to the ED via EMS for evaluation of chest pain.    Her troponins were significantly elevated (peaked at 15,900).  She was admitted to the hospital for acute NSTEMI.  She also had severe anemia and thrombocytopenia with hemoglobin of 6.1 and platelet count of 35,000 respectively.  She was transfused with 2 units of packed red blood cells and H&H have remained stable.  She was on aspirin and Plavix prior to admission but these were held temporarily.  Both aspirin and Plavix have been resumed since anemia and thrombocytopenia have improved.  Hematologist was consulted, recommended bone marrow biopsy for further evaluation.    Cardiologist consulted for acute NSTEMI in setting of severe anemia and known history of coronary artery disease.  This is being medically managed, but heparin had to be deferred due to severe anemia and thrombocytopenia.  She was started on midodrine for hypotension.     Assessment & Plan   Principal Problem:   Non-STEMI (non-ST elevated myocardial infarction) ([place]) Active Problems:   Diabetes mellitus with complication (HCC)   Essential hypertension   Complex partial seizure disorder (HCC)   Chest pain due to CAD ([place])   Anemia   Severe thrombocytopenia (HCC)  Pancytopenia - POA Thrombocytopenia - presented w platelet 35k >> 195k. Improved.  Did not require platelet transfusion. Anemia, History of Iron Deficiency - presented with Hbg 6.1, consistent with acute on chronic anemia. Pt has had intermittent drops in Hbg over past 6 months.  Follows with Dr. Janese Banks.  Iron deficiency was corrected  outpatient.  FOBT neg. S/p 2 units pRBC's transfused 9/3. --Bone Marrow biopsy scheduled for today --Results to be discussed at outpatient follow up with Dr. Janese Banks --Trend H&H, transfuse if Hbg<7   Acute Non-STEMI / Multi-vessel CAD s/p CABG - hsTroponin peaked at 15900, abnormal ECG, known history of CAD not amenable to intervention.  CABG was in 1997.  Recent cardiac cath in 09/7060 complicated by acute respiratory distress requiring intubation with findings overall unchanged from previous cath in 2017, with LVEF 35%, with severe multivessel native disease with occlusion proximal LAD,leftcircumflex, and RCA, with total occlusion of SVG to RCA, circumflex and diagonal, with patent LIMA to mid LAD. Chronic Angina    Chronic systolic CHF - Echo: EF 37-62%, moderate MR  --Cardiology consulted, signed off 9/8. --Follow up w Dr. Nehemiah Massed in 1 week --Continue ASA/Plavix, Toprol-XL, Lipitor --Ramipril and Cardizem on hold due to hypotension --Continue Ranexa for angina   Hyponatremia - Na 126 this AM.  Possibly chronic / intermittent, but was normal in May this year.  Has been stable 126-130 this admission.  Appears euvolemic.   --Daily BMP to monitor.   --Check TSH, cortisol, uric acid, urine osm and lytes, serum osm to evaluate.   Type 2 Diabetes - on sliding scale Novolog.  Continue PRN gabapentin.  Essential Hypertension - chronic.  Currently with intermittent hypotension.  Ramipril, Cardizem on hold.  Maintain MAP>65.  Continue midodrine.  Complex partial seizure disorder - continue Lamictal, Keppra     DVT prophylaxis: SCDs Start: 04/26/20 1428   Diet:  Diet Orders (From  admission, onward)    Start     Ordered   05/01/20 0001  Diet NPO time specified  Diet effective midnight        04/30/20 0959            Code Status: DNR    Subjective 05/01/20    Patient seen with son at bedside today.  She reports feeling better.  RN informed me of nosebleed this AM, was able to be  stopped, probably from nasal oxygen.  Patient denies chest pain, SOB or any other acute complaints.     Disposition Plan & Communication   Status is: Inpatient  Remains Inpatient appropriate because of hyponatremia, ongoing evaluation of pancytopenia, episodes of hypotension.  Dispo: The patient is from: home              Anticipated d/c is to: home with HHPT              Anticipated d/c date is: 1-2 days              Patient currently is NOT medically stable for d/c.        Family Communication: son at bedside during encounter today, 9/8.   Consults, Procedures, Significant Events   Consultants:   Hematology  Cardiology  Radiology  Procedures:   Bone marrow biopsy 05/01/20  Antimicrobials:   None    Objective   Vitals:   04/30/20 2000 04/30/20 2049 05/01/20 0634 05/01/20 0749  BP:  135/72 (!) 151/79 (!) 156/72  Pulse:  78 79 74  Resp: (!) _0 (!) 22  Temp:  (!) 97.3 F (36.3 C) 97.6 F (36.4 C) 98.2 F (36.8 C)  TempSrc:  Oral Oral Oral  SpO2:  100% 100% 100%  Weight:      Height:        Intake/Output Summary (Last 24 hours) at 05/01/2020 0834 Last data filed at 04/30/2020 1850 Gross per 24 hour  Intake 360 ml  Output --  Net 360 ml   Filed Weights   04/26/20 1116 04/27/20 0453 04/28/20 0338  Weight: 56.7 kg 51.8 kg 54.6 kg    Physical Exam:  General exam: awake, alert, no acute distress Respiratory system: CTAB, no wheezes, rales or rhonchi, normal respiratory effort. Cardiovascular system: normal S1/S2, RRR, no JVD, murmurs, rubs, gallops, no pedal edema.   Gastrointestinal system: soft, NT, ND, no HSM felt, +bowel sounds. Central nervous system: A&O x3. no gross focal neurologic deficits, normal speech Extremities: moves all, no cyanosis, normal tone Psychiatry: normal mood, congruent affect  Labs   Data Reviewed: I have personally reviewed following labs and imaging studies  CBC: Recent Labs  Lab 04/26/20 1134 04/26/20 1134  04/27/20 0450 04/28/20 1101 04/29/20 1032 04/30/20 1009 05/01/20 0515  WBC 4.9   < > 3.5* 3.1* 3.4* 4.2 5.1  NEUTROABS 2.9  --   --  1.7 2.1 2.6 3.0  HGB 6.1*   < > 10.0* 9.1* 8.9* 10.1* 9.7*  HCT 17.5*   < > 28.1* 24.8* 25.8* 28.7* 27.7*  MCV 101.7*   < > 93.4 93.6 95.2 94.4 95.5  PLT 35*   < > 24* 42* 70* 130* 195   < > = values in this interval not displayed.   Basic Metabolic Panel: Recent Labs  Lab 04/27/20 0450 04/28/20 1101 04/29/20 1032 04/30/20 1009 05/01/20 0515  NA 130* 126* 128* 126* 126*  K 4.5 4.6 4.7 4.6 4.4  CL 104 100 100 98 99  CO2 16* 18* 20* 18* 17*  GLUCOSE 166* 217* 228* 224* 169*  BUN 38* 38* 32* 29* 32*  CREATININE 1.05* 1.28* 0.97 0.85 0.94  CALCIUM 8.6* 8.0* 8.1* 8.7* 8.7*   GFR: Estimated Creatinine Clearance: 41.1 mL/min (by C-G formula based on SCr of 0.94 mg/dL). Liver Function Tests: No results for input(s): AST, ALT, ALKPHOS, BILITOT, PROT, ALBUMIN in the last 168 hours. No results for input(s): LIPASE, AMYLASE in the last 168 hours. No results for input(s): AMMONIA in the last 168 hours. Coagulation Profile: Recent Labs  Lab 04/26/20 1525 04/30/20 0602  INR 1.1 1.3*   Cardiac Enzymes: No results for input(s): CKTOTAL, CKMB, CKMBINDEX, TROPONINI in the last 168 hours. BNP (last 3 results) No results for input(s): PROBNP in the last 8760 hours. HbA1C: No results for input(s): HGBA1C in the last 72 hours. CBG: Recent Labs  Lab 04/29/20 2225 04/30/20 0745 04/30/20 1130 04/30/20 1701 05/01/20 0808  GLUCAP 223* 193* 209* 218* 174*   Lipid Profile: No results for input(s): CHOL, HDL, LDLCALC, TRIG, CHOLHDL, LDLDIRECT in the last 72 hours. Thyroid Function Tests: No results for input(s): TSH, T4TOTAL, FREET4, T3FREE, THYROIDAB in the last 72 hours. Anemia Panel: No results for input(s): VITAMINB12, FOLATE, FERRITIN, TIBC, IRON, RETICCTPCT in the last 72 hours. Sepsis Labs: No results for input(s): PROCALCITON, LATICACIDVEN in  the last 168 hours.  Recent Results (from the past 240 hour(s))  SARS Coronavirus 2 by RT PCR (hospital order, performed in [facility] hospital lab) Nasopharyngeal Nasopharyngeal Swab     Status: None   Collection Time: 04/26/20 11:34 AM   Specimen: Nasopharyngeal Swab  Result Value Ref Range Status   SARS Coronavirus 2 NEGATIVE NEGATIVE Final    Comment: (NOTE) SARS-CoV-2 target nucleic acids are NOT DETECTED.  The SARS-CoV-2 RNA is generally detectable in upper and lower respiratory specimens during the acute phase of infection. The lowest concentration of SARS-CoV-2 viral copies this assay can detect is 250 copies / mL. A negative result does not preclude SARS-CoV-2 infection and should not be used as the sole basis for treatment or other patient management decisions.  A negative result may occur with improper specimen collection / handling, submission of specimen other than nasopharyngeal swab, presence of viral mutation(s) within the areas targeted by this assay, and inadequate number of viral copies (<250 copies / mL). A negative result must be combined with clinical observations, patient history, and epidemiological information.  Fact Sheet for Patients:   StrictlyIdeas.no  Fact Sheet for Healthcare Providers: BankingDealers.co.za  This test is not yet approved or  cleared by the [place] FDA and has been authorized for detection and/or diagnosis of SARS-CoV-2 by FDA under an Emergency Use Authorization (EUA).  This EUA will remain in effect (meaning this test can be used) for the duration of the COVID-19 declaration under Section 564(b)(1) of the Act, 21 U.S.C. section 360bbb-3(b)(1), unless the authorization is terminated or revoked sooner.  Performed at [facility], [address], [place], [place] [address]       Imaging Studies   No results found.   Medications   Scheduled Meds: . sodium  chloride   Intravenous Once  . aspirin EC  81 mg Oral Daily  . atorvastatin  40 mg Oral Daily  . brinzolamide  1 drop Both Eyes BID   And  . brimonidine  1 drop Both Eyes BID  . clopidogrel  75 mg Oral Daily  . folic acid  1 mg Oral Daily  .  insulin aspart  0-15 Units Subcutaneous TID WC  . lamoTRIgine  100 mg Oral Daily  . levETIRAcetam  500 mg Oral BID  . mouth rinse  15 mL Mouth Rinse BID  . metoprolol succinate  25 mg Oral Daily  . midodrine  5 mg Oral TID WC  . ranolazine  1,000 mg Oral BID  . sodium chloride flush  3 mL Intravenous Q12H  . timolol  1 drop Both Eyes BID  . vitamin B-12  500 mcg Oral BH-q7a  . vitamin E  400 Units Oral BH-q7a   Continuous Infusions: . sodium chloride    . sodium chloride    . sodium chloride         LOS: 5 days    Time spent: 30 minutes    Ezekiel Slocumb, DO [facility]  05/01/2020, 8:34 AM    If 7PM-7AM, please contact night-coverage. How to contact the [facility] Attending or Consulting provider [place] or covering provider during after hours [place], for this patient?    1. Check the care team in [place] and look for a) attending/consulting [facility] provider listed and b) the [facility] team listed 2. Log into [email or web address] and use [place]'s universal password to access. If you do not have the password, please contact the hospital operator. 3. Locate the [facility] provider you are looking for under [facility] and page to a number that you can be directly reached. 4. If you still have difficulty reaching the provider, please page the [facility] (Director on Call) for the Hospitalists listed on amion for assistance.

## 2020-05-01 NOTE — Progress Notes (Signed)
Pt is NPO for biopsy. CBG is 174. MD order to hold SSI for NPO status. I will continue to assess.

## 2020-05-01 NOTE — Progress Notes (Signed)
Pt blows nose and develops a nose bleed. RN and Runner, broadcasting/film/video hold pressure to nose approximately 15 minutes. Nose bleed seems to be controled. Pt has stopped coughing and feels like the drainage running  Down her throat has stopped.  Pts oxygen on RA is 95% RN will continue to monitor oxygenation while on room air. Pt instructed if she felt SOB to notify RN. Pt also instructed to notify RN is nose starts to bleed again.

## 2020-05-01 NOTE — Hospital Course (Addendum)
Leah Small is a 79 y.o. female with medical history significant for coronary artery disease status post CABG, status post stent angioplasty, history of hypertension, diabetes mellitus, dyslipidemia and seizure disorder who presented to the ED via EMS for evaluation of chest pain.    Her troponins were significantly elevated (peaked at 15,900).  She was admitted to the hospital for acute NSTEMI.  She also had severe anemia and thrombocytopenia with hemoglobin of 6.1 and platelet count of 35,000 respectively.  She was transfused with 2 units of packed red blood cells and H&H have remained stable.  She was on aspirin and Plavix prior to admission but these were held temporarily.  Both aspirin and Plavix have been resumed since anemia and thrombocytopenia have improved.  Hematologist was consulted, recommended bone marrow biopsy for further evaluation.    Cardiologist consulted for acute NSTEMI in setting of severe anemia and known history of coronary artery disease.  This is being medically managed, but heparin had to be deferred due to severe anemia and thrombocytopenia.  She was started on midodrine for hypotension.  Admission has been prolonged by hyponatremia, hypotension, and now positive Covid-19 test.  Received monoclonal antibodies given asymptomatic and incidental positive.  PT recommends SNF vs 24/7 assistance at home.    Patient will have close follow up with Dr. Janese Banks.  She recommends transfusions for Hbg<8.

## 2020-05-01 NOTE — Progress Notes (Signed)
[facility]    SUBJECTIVE: The patient denies chest pain at this time, reports a nose bleed this morning that resolved after about 30 minutes.    Vitals:   04/30/20 2000 04/30/20 2049 05/01/20 0634 05/01/20 0749  BP:  135/72 (!) 151/79 (!) 156/72  Pulse:  78 79 74  Resp: (!) _0 (!) 22  Temp:  (!) 97.3 F (36.3 C) 97.6 F (36.4 C) 98.2 F (36.8 C)  TempSrc:  Oral Oral Oral  SpO2:  100% 100% 100%  Weight:      Height:         Intake/Output Summary (Last 24 hours) at 05/01/2020 0834 Last data filed at 04/30/2020 1850 Gross per 24 hour  Intake 360 ml  Output --  Net 360 ml      PHYSICAL EXAM  General:Frail elderly female lying in bedin no acute distress. Appears weak/fatigued HEENT:bruising around R eye. Dried blood in both nares Neck: No JVD.  Lungs: slight increased effort of breathing on room air. Decreased bibasilar breath sounds Heart:HRRR . Normal S1 and S2 without gallops or murmurs.  Abdomen:nondistended Msk: Back normal,gait not assessed. No obvious deformities. Extremities:No clubbing, cyanosis or edema.  Neuro: Alert and oriented X 3. Psych: Good affect, responds appropriately  Skin: Pale, dry, no diaphoresis    LABS: Basic Metabolic Panel: Recent Labs    04/30/20 1009 05/01/20 0515  NA 126* 126*  K 4.6 4.4  CL 98 99  CO2 18* 17*  GLUCOSE 224* 169*  BUN 29* 32*  CREATININE 0.85 0.94  CALCIUM 8.7* 8.7*   Liver Function Tests: No results for input(s): AST, ALT, ALKPHOS, BILITOT, PROT, ALBUMIN in the last 72 hours. No results for input(s): LIPASE, AMYLASE in the last 72 hours. CBC: Recent Labs    04/30/20 1009 05/01/20 0515  WBC 4.2 5.1  NEUTROABS 2.6 3.0  HGB 10.1* 9.7*  HCT 28.7* 27.7*  MCV 94.4 95.5  PLT 130* 195   Cardiac Enzymes: No results for input(s): CKTOTAL, CKMB, CKMBINDEX, TROPONINI in the last 72 hours. BNP: Invalid input(s): POCBNP D-Dimer: No results for input(s): DDIMER in the last 72  hours. Hemoglobin A1C: No results for input(s): HGBA1C in the last 72 hours. Fasting Lipid Panel: No results for input(s): CHOL, HDL, LDLCALC, TRIG, CHOLHDL, LDLDIRECT in the last 72 hours. Thyroid Function Tests: No results for input(s): TSH, T4TOTAL, T3FREE, THYROIDAB in the last 72 hours.  Invalid input(s): FREET3 Anemia Panel: No results for input(s): VITAMINB12, FOLATE, FERRITIN, TIBC, IRON, RETICCTPCT in the last 72 hours.  No results found.   Echo LVEF 20-25%, akinetic basal inferolateral, basal inferior, basal inferoseptal, and hypokinesis of mid inferoseptal, apical septal, and apex.  TELEMETRY: sinus rhythm  ASSESSMENT AND PLAN:  Principal Problem:   Non-STEMI (non-ST elevated myocardial infarction) ([place]) Active Problems:   Diabetes mellitus with complication (HCC)   Essential hypertension   Complex partial seizure disorder (HCC)   Chest pain due to CAD ([place])   Anemia   Severe thrombocytopenia ([place])    1.NSTEMI,with known extensive coronary artery disease, previously not amendable to intervention, with abnormal ECG and elevatedhsT813,1,246,5,303,15,900, in the setting of marked anemia.Patient has chronic angina, though chest painwasworse onpresentation, has since improved after 2 units of PRBCs.The patient denies chest pain today.  2. Coronary artery disease, CABG x4 in 1997, NSTEMI 2017, multiple stents, STEMI in 12/2019, treated medically, chronic angina on Ranexa; previous cardiac catheterization in 11/9199 complicated by acute respiratory distress requiring intubation with findings overall unchanged from  previous cath in 2017, with LVEF 35%, with severe multivessel native disease with occlusion proximal LAD,leftcircumflex, and RCA, with total occlusion of SVG to RCA, circumflex and diagonal, with patent LIMA to mid LAD.  3. Marked anemia/thrombocytopneia,initialhemoglobin 6.1, platelets 35, heme negative stool, hematologyfollowing and recommending  bone marrow biopsy. No evidence of gross GI bleeding.Hemoglobin improved to 10.0 after PRBCs, platelets42, now hemoglobin is 9.7, platelets 195.  4. Hyponatremia, sodium 126 this morning  5. Hypotension, improved with addition of midodrine, ramipril and diltiazem on hold   Recommendations: 1. Continue metoprolol succinate, Ranexa, and atorvastatin 2. Bone marrow biopsy scheduled for today; patient may proceed and is considered a low risk for serious cardiovascular complications during this low risk procedure. 3. Continue aspirin and Plavix  4. Defer initiating Entresto due to hypotension; could be considered as outpatient if renal function and blood pressure permit 5. No further cardiac diagnostics recommended at this time.  6. Recommend follow-up with Dr. Nehemiah Massed in 1 week from discharge.  Will sign off for now; please call/[place] with questions.    Clabe Seal, PA-C 05/01/2020 8:34 AM

## 2020-05-01 NOTE — Progress Notes (Signed)
Patient clinically stable post BMB per Dr Grace Isaac, tolerated well. Awake/alert and oriented post procedure. Dressing to medial sacral area dry;intact. Report given to Mechele Dawley at bedside with questions answered. Denies complaints at this time.

## 2020-05-02 LAB — BASIC METABOLIC PANEL
Anion gap: 10 (ref 5–15)
BUN: 34 mg/dL — ABNORMAL HIGH (ref 8–23)
CO2: 19 mmol/L — ABNORMAL LOW (ref 22–32)
Calcium: 8.7 mg/dL — ABNORMAL LOW (ref 8.9–10.3)
Chloride: 98 mmol/L (ref 98–111)
Creatinine, Ser: 0.98 mg/dL (ref 0.44–1.00)
GFR calc Af Amer: >60 mL/min (ref >60)
GFR calc non Af Amer: 54 mL/min — ABNORMAL LOW (ref >60)
Glucose, Bld: 186 mg/dL — ABNORMAL HIGH (ref 70–99)
Potassium: 4.2 mmol/L (ref 3.5–5.1)
Sodium: 127 mmol/L — ABNORMAL LOW (ref 135–145)

## 2020-05-02 LAB — CBC
HCT: 26.7 % — ABNORMAL LOW (ref 36.0–46.0)
Hemoglobin: 9.2 g/dL — ABNORMAL LOW (ref 12.0–15.0)
MCH: 33.1 pg (ref 26.0–34.0)
MCHC: 34.5 g/dL (ref 30.0–36.0)
MCV: 96 fL (ref 80.0–100.0)
Platelets: 284 10*3/uL (ref 150–400)
RBC: 2.78 MIL/uL — ABNORMAL LOW (ref 3.87–5.11)
RDW: 17.8 % — ABNORMAL HIGH (ref 11.5–15.5)
WBC: 5.5 10*3/uL (ref 4.0–10.5)
nRBC: 0 % (ref 0.0–0.2)

## 2020-05-02 LAB — GLUCOSE, CAPILLARY
Glucose-Capillary: 175 mg/dL — ABNORMAL HIGH (ref 70–99)
Glucose-Capillary: 279 mg/dL — ABNORMAL HIGH (ref 70–99)
Glucose-Capillary: 252 mg/dL — ABNORMAL HIGH (ref 70–99)
Glucose-Capillary: 203 mg/dL — ABNORMAL HIGH (ref 70–99)
Glucose-Capillary: 296 mg/dL — ABNORMAL HIGH (ref 70–99)

## 2020-05-02 LAB — MAGNESIUM: Magnesium: 1.8 mg/dL (ref 1.7–2.4)

## 2020-05-02 LAB — CORTISOL: Cortisol, Plasma: 11.6 ug/dL

## 2020-05-02 LAB — TSH: TSH: 1.117 u[IU]/mL (ref 0.350–4.500)

## 2020-05-02 LAB — URIC ACID: Uric Acid, Serum: 5 mg/dL (ref 2.5–7.1)

## 2020-05-02 LAB — OSMOLALITY: Osmolality: 280 mOsm/kg (ref 275–295)

## 2020-05-02 MED ORDER — ENOXAPARIN SODIUM 40 MG/0.4ML ~~LOC~~ SOLN
40.0000 mg | SUBCUTANEOUS | Status: DC
  Administered 2020-05-02 – 2020-05-07 (×6): 40 mg via SUBCUTANEOUS
  Filled 2020-05-02 (×6): qty 0.4

## 2020-05-02 MED ORDER — HALOPERIDOL LACTATE 5 MG/ML IJ SOLN: 2.5000 mg | Freq: Once | INTRAMUSCULAR | Status: DC

## 2020-05-02 MED ORDER — MIDODRINE HCL 5 MG PO TABS
10.0000 mg | ORAL_TABLET | Freq: Three times a day (TID) | ORAL | Status: DC
  Administered 2020-05-02 – 2020-05-05 (×7): 10 mg via ORAL
  Filled 2020-05-02 (×10): qty 2

## 2020-05-02 NOTE — Progress Notes (Addendum)
PROGRESS NOTE    JNYA BROSSARD   LPF:[id number]  DOB: 1941/07/14  PCP: Idelle Crouch, MD    DOA: 04/26/2020 LOS: 6   Brief Narrative   Leah Small is a 79 y.o. female with medical history significant for coronary artery disease status post CABG, status post stent angioplasty, history of hypertension, diabetes mellitus, dyslipidemia and seizure disorder who presented to the ED via EMS for evaluation of chest pain.    Her troponins were significantly elevated (peaked at 15,900).  She was admitted to the hospital for acute NSTEMI.  She also had severe anemia and thrombocytopenia with hemoglobin of 6.1 and platelet count of 35,000 respectively.  She was transfused with 2 units of packed red blood cells and H&H have remained stable.  She was on aspirin and Plavix prior to admission but these were held temporarily.  Both aspirin and Plavix have been resumed since anemia and thrombocytopenia have improved.  Hematologist was consulted, recommended bone marrow biopsy for further evaluation.    Cardiologist consulted for acute NSTEMI in setting of severe anemia and known history of coronary artery disease.  This is being medically managed, but heparin had to be deferred due to severe anemia and thrombocytopenia.  She was started on midodrine for hypotension.     Assessment & Plan   Principal Problem:   Non-STEMI (non-ST elevated myocardial infarction) ([place]) Active Problems:   Diabetes mellitus with complication (HCC)   Essential hypertension   Complex partial seizure disorder (HCC)   Chest pain due to CAD ([place])   Anemia   Severe thrombocytopenia (HCC)   Pancytopenia (HCC)   Hypotension - unclear etiology, BP is quite labile.  Past 24 hours SBP range 90-141, DBP range 54-66.   --On midodrine which has been increased to 10 mg TID.   --Continue holding ramipril and cardizem for now.  Maintain MAP>=65. Patient is afebrile, no leukocytosis and asymptomatic, doubt underlying infection  at this point.  Monitor clinically.   Pancytopenia - POA, etiology to be determined. Thrombocytopenia - presented w platelet 35k >> 195k. Improved.  Did not require platelet transfusion. Anemia / History of Iron Deficiency - presented with Hbg 6.1, consistent with acute on chronic anemia. Pt has had intermittent drops in Hbg over past 6 months.  Follows with Dr. Janese Banks.  Iron deficiency was corrected outpatient.  FOBT neg. S/p 2 units pRBC's transfused 9/3. --Bone Marrow biopsy done 9/8 --Results to be discussed at outpatient follow up with Dr. Janese Banks --Trend H&H, transfuse if Hbg<7   Acute Non-STEMI / Multi-vessel CAD s/p CABG [place]roponin [place] at [address], abnormal ECG, known history of CAD not amenable to intervention.  CABG was in 1997.  Recent cardiac cath in 12/3297 complicated by acute respiratory distress requiring intubation with findings overall unchanged from previous cath in 2017, with LVEF 35%, with severe multivessel native disease with occlusion proximal LAD,leftcircumflex, and RCA, with total occlusion of SVG to RCA, circumflex and diagonal, with patent LIMA to mid LAD. Chronic Angina    Chronic systolic CHF - Echo: EF 24-26%, moderate MR  --Cardiology consulted, signed off 9/8. --Follow up w Dr. Nehemiah Massed in 1 week --Continue ASA/Plavix, Toprol-XL, Lipitor --Ramipril and Cardizem on hold due to hypotension --Continue Ranexa for angina   Hyponatremia - Na 126>>127 this AM.  Possibly chronic / intermittent, but was normal in May this year.  Has been stable 126-130 this admission.  Appears euvolemic.  Serum osm isotonic.  TSH, cortisol and uric acid normal.  Possibly  SIADH. --Daily BMP to monitor.   --Follow up pending urine osm and lytes   Type 2 Diabetes - on sliding scale Novolog.  Continue PRN gabapentin.  Essential Hypertension - chronic.  Currently with intermittent hypotension.  Ramipril, Cardizem on hold.  Maintain MAP>65.  Continue midodrine.  Complex partial seizure  disorder - continue Lamictal, Keppra     DVT prophylaxis: enoxaparin (LOVENOX) injection 40 mg Start: 05/02/20 2200   Diet:  Diet Orders (From admission, onward)    Start     Ordered   05/01/20 1112  Diet regular Room service appropriate? Yes; Fluid consistency: Thin  Diet effective now       Comments: Advance diet to pre procedural diet or regular as tolerated.  Question Answer Comment  Room service appropriate? Yes   Fluid consistency: Thin      05/01/20 1111            Code Status: DNR    Subjective 05/02/20    Patient seen at bedside today, no family in room.  She reports feeling well, but tired today after working with PT and having a bath earlier.  Does report dizziness when she gets up.  Denies other complaints including CP, SOB, F/C, N/V/D.    Disposition Plan & Communication   Status is: Inpatient  Remains Inpatient appropriate because of hyponatremia and hypotension requiring close monitor and further evaluation.  Dispo: The patient is from: home              Anticipated d/c is to: home with HHPT              Anticipated d/c date is: 1-2 days              Patient currently is NOT medically stable for d/c.   Family Communication: None at bedside today.  Son updated at bedside during encounter 9/8.   Consults, Procedures, Significant Events   Consultants:   Hematology  Cardiology  Radiology  Procedures:   Bone marrow biopsy 05/01/20  Antimicrobials:   None    Objective   Vitals:   05/02/20 0532 05/02/20 0805 05/02/20 1132 05/02/20 1544  BP:  (!) 141/66 (!) 113/54 118/65  Pulse:  70 66 64  Resp:  18 18 (!) 22  Temp:  97.8 F (36.6 C) 97.7 F (36.5 C) 98.4 F (36.9 C)  TempSrc:  Oral Oral Oral  SpO2:   100% (!) 87%  Weight: 58.4 kg     Height:        Intake/Output Summary (Last 24 hours) at 05/02/2020 1756 Last data filed at 05/02/2020 1410 Gross per 24 hour  Intake 480 ml  Output 1700 ml  Net -1220 ml   Filed Weights    04/27/20 0453 04/28/20 0338 05/02/20 0532  Weight: 51.8 kg 54.6 kg 58.4 kg    Physical Exam:  General exam: awake, alert, no acute distress Respiratory system: CTAB, no wheezes, rales or rhonchi, normal respiratory effort. Cardiovascular system: normal S1/S2, RRR, pedal edema.   Gastrointestinal system: soft, NT, ND, no HSM felt, +bowel sounds. Central nervous system: A&O x3. no gross focal neurologic deficits, normal speech Extremities: moves all, no cyanosis, normal tone Psychiatry: normal mood, congruent affect  Labs   Data Reviewed: I have personally reviewed following labs and imaging studies  CBC: Recent Labs  Lab 04/26/20 1134 04/27/20 0450 04/28/20 1101 04/29/20 1032 04/30/20 1009 05/01/20 0515 05/02/20 0523  WBC 4.9   < > 3.1* 3.4* 4.2 5.1 5.5  NEUTROABS  2.9  --  1.7 2.1 2.6 3.0  --   HGB 6.1*   < > 9.1* 8.9* 10.1* 9.7* 9.2*  HCT 17.5*   < > 24.8* 25.8* 28.7* 27.7* 26.7*  MCV 101.7*   < > 93.6 95.2 94.4 95.5 96.0  PLT 35*   < > 42* 70* 130* 195 284   < > = values in this interval not displayed.   Basic Metabolic Panel: Recent Labs  Lab 04/28/20 1101 04/29/20 1032 04/30/20 1009 05/01/20 0515 05/02/20 0523  NA 126* 128* 126* 126* 127*  K 4.6 4.7 4.6 4.4 4.2  CL 100 100 98 99 98  CO2 18* 20* 18* 17* 19*  GLUCOSE 217* 228* 224* 169* 186*  BUN 38* 32* 29* 32* 34*  CREATININE 1.28* 0.97 0.85 0.94 0.98  CALCIUM 8.0* 8.1* 8.7* 8.7* 8.7*  MG  --   --   --   --  1.8   GFR: Estimated Creatinine Clearance: 42.2 mL/min (by C-G formula based on SCr of 0.98 mg/dL). Liver Function Tests: No results for input(s): AST, ALT, ALKPHOS, BILITOT, PROT, ALBUMIN in the last 168 hours. No results for input(s): LIPASE, AMYLASE in the last 168 hours. No results for input(s): AMMONIA in the last 168 hours. Coagulation Profile: Recent Labs  Lab 04/26/20 1525 04/30/20 0602  INR 1.1 1.3*   Cardiac Enzymes: No results for input(s): CKTOTAL, CKMB, CKMBINDEX, TROPONINI in the  last 168 hours. BNP (last 3 results) No results for input(s): PROBNP in the last 8760 hours. HbA1C: No results for input(s): HGBA1C in the last 72 hours. CBG: Recent Labs  Lab 05/01/20 1603 05/01/20 1957 05/02/20 0808 05/02/20 1133 05/02/20 1617  GLUCAP 236* 237* 175* 279* 252*   Lipid Profile: No results for input(s): CHOL, HDL, LDLCALC, TRIG, CHOLHDL, LDLDIRECT in the last 72 hours. Thyroid Function Tests: Recent Labs    05/02/20 0523  TSH 1.117   Anemia Panel: No results for input(s): VITAMINB12, FOLATE, FERRITIN, TIBC, IRON, RETICCTPCT in the last 72 hours. Sepsis Labs: No results for input(s): PROCALCITON, LATICACIDVEN in the last 168 hours.  Recent Results (from the past 240 hour(s))  SARS Coronavirus 2 by RT PCR (hospital order, performed in [facility] hospital lab) Nasopharyngeal Nasopharyngeal Swab     Status: None   Collection Time: 04/26/20 11:34 AM   Specimen: Nasopharyngeal Swab  Result Value Ref Range Status   SARS Coronavirus 2 NEGATIVE NEGATIVE Final    Comment: (NOTE) SARS-CoV-2 target nucleic acids are NOT DETECTED.  The SARS-CoV-2 RNA is generally detectable in upper and lower respiratory specimens during the acute phase of infection. The lowest concentration of SARS-CoV-2 viral copies this assay can detect is 250 copies / mL. A negative result does not preclude SARS-CoV-2 infection and should not be used as the sole basis for treatment or other patient management decisions.  A negative result may occur with improper specimen collection / handling, submission of specimen other than nasopharyngeal swab, presence of viral mutation(s) within the areas targeted by this assay, and inadequate number of viral copies (<250 copies / mL). A negative result must be combined with clinical observations, patient history, and epidemiological information.  Fact Sheet for Patients:   StrictlyIdeas.no  Fact Sheet for Healthcare  Providers: BankingDealers.co.za  This test is not yet approved or  cleared by the [place] FDA and has been authorized for detection and/or diagnosis of SARS-CoV-2 by FDA under an Emergency Use Authorization (EUA).  This EUA will remain in effect (meaning this  test can be used) for the duration of the COVID-19 declaration under Section 564(b)(1) of the Act, 21 U.S.C. section 360bbb-3(b)(1), unless the authorization is terminated or revoked sooner.  Performed at [facility], [address] [place], [place] [address]       Imaging Studies   CT BONE MARROW BIOPSY & ASPIRATION  Result Date: 05/01/2020 INDICATION: Profound anemia of uncertain etiology. Please perform CT-guided bone marrow biopsy for tissue diagnostic purposes. EXAM: CT-GUIDED BONE MARROW BIOPSY AND ASPIRATION MEDICATIONS: None ANESTHESIA/SEDATION: Fentanyl 25 mcg IV; Versed 0.5 mg IV Sedation Time: 10 Minutes; The patient was continuously monitored during the procedure by the interventional radiology nurse under my direct supervision. COMPLICATIONS: None immediate. PROCEDURE: Informed consent was obtained from the patient following an explanation of the procedure, risks, benefits and alternatives. The patient understands, agrees and consents for the procedure. All questions were addressed. A time out was performed prior to the initiation of the procedure. The patient was positioned prone and non-contrast localization CT was performed of the pelvis to demonstrate the iliac marrow spaces. The operative site was prepped and draped in the usual sterile fashion. Under sterile conditions and local anesthesia, a 22 gauge spinal needle was utilized for procedural planning. Next, an 11 gauge coaxial bone biopsy needle was advanced into the left iliac marrow space. Needle position was confirmed with CT imaging. Initially, a bone marrow aspiration was performed. Next, a bone marrow biopsy was obtained with  the 11 gauge outer bone marrow device. The 11 gauge coaxial bone biopsy needle was re-advanced into a slightly different location within the left iliac marrow space, positioning was confirmed with CT imaging and an additional bone marrow biopsy was obtained. The needle was removed and superficial hemostasis was obtained with manual compression. A dressing was applied. The patient tolerated the procedure well without immediate post procedural complication. IMPRESSION: Successful CT guided left iliac bone marrow aspiration and core biopsy. Electronically Signed   By: Sandi Mariscal M.D.   On: 05/01/2020 10:54     Medications   Scheduled Meds: . sodium chloride   Intravenous Once  . aspirin EC  81 mg Oral Daily  . atorvastatin  40 mg Oral Daily  . brinzolamide  1 drop Both Eyes BID   And  . brimonidine  1 drop Both Eyes BID  . clopidogrel  75 mg Oral Daily  . enoxaparin (LOVENOX) injection  40 mg Subcutaneous Q24H  . folic acid  1 mg Oral Daily  . insulin aspart  0-15 Units Subcutaneous TID WC  . lamoTRIgine  100 mg Oral Daily  . levETIRAcetam  500 mg Oral BID  . mouth rinse  15 mL Mouth Rinse BID  . metoprolol succinate  25 mg Oral Daily  . midodrine  10 mg Oral TID WC  . ranolazine  1,000 mg Oral BID  . sodium chloride flush  3 mL Intravenous Q12H  . timolol  1 drop Both Eyes BID  . vitamin B-12  500 mcg Oral BH-q7a  . vitamin E  400 Units Oral BH-q7a   Continuous Infusions: . sodium chloride    . sodium chloride    . sodium chloride         LOS: 6 days    Time spent: 30 minutes    Ezekiel Slocumb, DO [facility]  05/02/2020, 5:56 PM    If 7PM-7AM, please contact night-coverage. How to contact the [facility] Attending or Consulting provider [place] or covering provider during after hours  7P -7A, for this patient?    1. Check the care team in [facility] and look for a) attending/consulting [facility] provider listed and b) the [facility] team listed 2. Log into [email or web address] and use [facility]'s universal password to access. If you do not have the password, please contact the hospital operator. 3. Locate the [facility] provider you are looking for under [facility] and page to a number that you can be directly reached. 4. If you still have difficulty reaching the provider, please page the [facility] (Director on Call) for the Hospitalists listed on amion for assistance.

## 2020-05-02 NOTE — Progress Notes (Signed)
Mobility Specialist - Progress Note   05/02/20 1202  Mobility  Activity  (Bed exercises)  Range of Motion/Exercises Right leg;Left leg (Quad sets, ankle pumps, straight leg raises, hip add/abd)  Level of Assistance Minimal assist, patient does 75% or more  Assistive Device None  Distance Ambulated (ft) 0 ft  Mobility Response Tolerated well  Mobility performed by Mobility specialist  $Mobility charge 1 Mobility    Pre-mobility: 66 HR, 106/50 BP, 99% SpO2 During mobility: 67 HR, 100% SpO2 Post-mobility: 66 HR, 113/54 BP, 100% SpO2   Pt was lying in bed upon arrival. Pt agreed to session. Pt denied any pain, dizziness, or fatigue. Pt c/o tiredness d/t her statement that she "had just gotten back into bed from sitting in the recliner". Pt performed bed exercises with minA: ankle pumps, quad sets, straight leg raises, and hip abd/add (10x/leg). Pt showed struggle performing straight leg raises on L LE. NT entered room mid-session. Overall, pt tolerated session well. Pt remains in bed with phone/call bell in reach.   Filiberto Pinks Mobility Specialist 05/02/20, 12:10 PM

## 2020-05-02 NOTE — Care Management Important Message (Signed)
Important Message  Patient Details  Name: Leah Small MRN: [id number] Date of Birth: [birth year]   Medicare Important Message Given:  Yes     [name] 05/02/2020, 2:54 PM

## 2020-05-03 LAB — BASIC METABOLIC PANEL
Anion gap: 10 (ref 5–15)
BUN: 32 mg/dL — ABNORMAL HIGH (ref 8–23)
CO2: 18 mmol/L — ABNORMAL LOW (ref 22–32)
Calcium: 8.7 mg/dL — ABNORMAL LOW (ref 8.9–10.3)
Chloride: 99 mmol/L (ref 98–111)
Creatinine, Ser: 0.82 mg/dL (ref 0.44–1.00)
GFR calc Af Amer: >60 mL/min (ref >60)
GFR calc non Af Amer: >60 mL/min (ref >60)
Glucose, Bld: 258 mg/dL — ABNORMAL HIGH (ref 70–99)
Potassium: 4.4 mmol/L (ref 3.5–5.1)
Sodium: 127 mmol/L — ABNORMAL LOW (ref 135–145)

## 2020-05-03 LAB — GLUCOSE, CAPILLARY
Glucose-Capillary: 254 mg/dL — ABNORMAL HIGH (ref 70–99)
Glucose-Capillary: 344 mg/dL — ABNORMAL HIGH (ref 70–99)
Glucose-Capillary: 181 mg/dL — ABNORMAL HIGH (ref 70–99)
Glucose-Capillary: 151 mg/dL — ABNORMAL HIGH (ref 70–99)

## 2020-05-03 LAB — CBC
HCT: 26.5 % — ABNORMAL LOW (ref 36.0–46.0)
Hemoglobin: 9.2 g/dL — ABNORMAL LOW (ref 12.0–15.0)
MCH: 33.5 pg (ref 26.0–34.0)
MCHC: 34.7 g/dL (ref 30.0–36.0)
MCV: 96.4 fL (ref 80.0–100.0)
Platelets: 411 10*3/uL — ABNORMAL HIGH (ref 150–400)
RBC: 2.75 MIL/uL — ABNORMAL LOW (ref 3.87–5.11)
RDW: 18.1 % — ABNORMAL HIGH (ref 11.5–15.5)
WBC: 6.2 10*3/uL (ref 4.0–10.5)
nRBC: 0 % (ref 0.0–0.2)

## 2020-05-03 LAB — MAGNESIUM: Magnesium: 1.7 mg/dL (ref 1.7–2.4)

## 2020-05-03 LAB — SODIUM: Sodium: 126 mmol/L — ABNORMAL LOW (ref 135–145)

## 2020-05-03 LAB — SURGICAL PATHOLOGY

## 2020-05-03 MED ORDER — SODIUM CHLORIDE 0.9 % IV SOLN: INTRAVENOUS | Status: AC

## 2020-05-03 MED ORDER — INSULIN DETEMIR 100 UNIT/ML ~~LOC~~ SOLN
8.0000 [IU] | Freq: Every day | SUBCUTANEOUS | Status: DC
  Administered 2020-05-03 – 2020-05-08 (×6): 8 [IU] via SUBCUTANEOUS
  Filled 2020-05-03 (×8): qty 0.08

## 2020-05-03 MED ORDER — MAGNESIUM SULFATE 2 GM/50ML IV SOLN
2.0000 g | Freq: Once | INTRAVENOUS | Status: AC
  Administered 2020-05-03: 2 g via INTRAVENOUS
  Filled 2020-05-03: qty 50

## 2020-05-03 NOTE — Progress Notes (Signed)
Inpatient Diabetes Program Recommendations  AACE/ADA: New Consensus Statement on Inpatient Glycemic Control (2015)  Target Ranges:  Prepandial:   less than 140 mg/dL      Peak postprandial:   less than 180 mg/dL (1-2 hours)      Critically ill patients:  140 - 180 mg/dL   Lab Results  Component Value Date   GLUCAP 344 (H) 05/03/2020   HGBA1C 6.8 (H) 04/26/2020    Review of Glycemic Control Results for Leah Small, Leah Small (MRN [id number]) as of 05/03/2020 11:57  Ref. Range 05/02/2020 16:17 05/02/2020 18:41 05/02/2020 21:19 05/03/2020 07:28 05/03/2020 11:15  Glucose-Capillary Latest Ref Range: 70 - 99 mg/dL 252 (H) 203 (H) 296 (H) 254 (H) 344 (H)   Diabetes history: DM2 Outpatient Diabetes medications: Glucotrol 5 mg qd + Met 1 gm bid Current orders for Inpatient glycemic control: Novolog moderate correction tid  Inpatient Diabetes Program Recommendations:   CBGs elevated >200 consistently. -While oral diabetes medications on hold: -Add Levemir 8 units daily (0.15 units/kg x 55.5 kg)  Thank you, Bethena Roys E. Ersel Wadleigh, RN, MSN, CDE  Diabetes Coordinator Inpatient Glycemic Control Team Team Pager [phone] (8am-5pm) 05/03/2020 12:02 PM \

## 2020-05-03 NOTE — TOC Initial Note (Signed)
Transition of Care [facility]) - Initial/Assessment Note    Patient Details  Name: Leah Small MRN: [id number] Date of Birth: 1940-12-10  Transition of Care [facility]) CM/SW Contact:    Shawn Route, RN Phone Number: 05/03/2020, 10:38 AM    Expected Discharge Plan: Home/Self Care Barriers to Discharge: Continued Medical Work up   Patient Goals and CMS Choice   CMS Medicare.gov Compare Post Acute Care list provided to:: Patient Choice offered to / list presented to : Patient  Expected Discharge Plan and Services Expected Discharge Plan: Home/Self Care     Post Acute Care Choice: Home Health Living arrangements for the past 2 months: Single Family Home                             HH Agency: [facility] Date [facility] Agency Contacted: 05/03/20 Time HH Agency Contacted: 1037 Representative spoke with at [facility] Agency: [place]  Prior Living Arrangements/Services Living arrangements for the past 2 months: Single Family Home Lives with:: Adult Children, Self Patient language and need for interpreter reviewed:: Yes Do you feel safe going back to the place where you live?: Yes      Need for Family Participation in Patient Care: Yes (Comment) Care giver support system in place?: Yes (comment)   Criminal Activity/Legal Involvement Pertinent to Current Situation/Hospitalization: No - Comment as needed  Activities of Daily Living Home Assistive Devices/Equipment: Walker (specify type), Cane (specify quad or straight), CBG Meter, Blood pressure cuff, Eyeglasses ADL Screening (condition at time of admission) Patient's cognitive ability adequate to safely complete daily activities?: Yes Is the patient deaf or have difficulty hearing?: No Does the patient have difficulty seeing, even when wearing glasses/contacts?: No Does the patient have difficulty concentrating, remembering, or making decisions?: No Patient able to express need for assistance with ADLs?: Yes Does the patient  have difficulty dressing or bathing?: Yes Independently performs ADLs?: No Does the patient have difficulty walking or climbing stairs?: Yes Weakness of Legs: Both Weakness of Arms/Hands: None  Permission Sought/Granted                  Emotional Assessment       Orientation: : Oriented to Self, Oriented to Place, Oriented to Situation Alcohol / Substance Use: Not Applicable Psych Involvement: No (comment)  Admission diagnosis:  Thrombocytopenia (HCC) [D69.6] Nonspecific chest pain [R07.9] Anemia, unspecified type [D64.9] Chest pain due to CAD [facility]) [I25.119] Patient Active Problem List   Diagnosis Date Noted  . Chest pain due to CAD (HCC) 04/26/2020  . Anemia 04/26/2020  . Severe thrombocytopenia (HCC) 04/26/2020  . Pancytopenia (HCC)   . Iron deficiency anemia 01/25/2020  . History of ETT   . Acute respiratory failure with hypoxia (HCC)   . Palliative care by specialist   . Goals of care, counseling/discussion   . Pressure ulcer 12/30/2019  . STEMI (ST elevation myocardial infarction) (HCC) 12/28/2019  . Malnutrition of moderate degree 08/12/2017  . Hypotension   . Carotid stenosis 11/12/2016  . Hypertensive heart disease 01/29/2016  . Normocytic anemia 01/29/2016  . History of vaginal bleeding 01/29/2016  . GERD (gastroesophageal reflux disease) 01/29/2016  . NSTEMI (non-ST elevated myocardial infarction) (HCC) 10/22/2015  . Non-STEMI (non-ST elevated myocardial infarction) (HCC) 10/22/2015  . 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 Coronary atherosclerosis of native coronary artery 10/22/2015  . Angina pectoris (HCC)   . Memory deficit   . Stroke (HCC) 07/06/2015  .  Diabetes mellitus with complication (HCC) 07/06/2015  . Essential hypertension 07/06/2015  . Hyperlipidemia 07/06/2015  . Left-sided weakness    PCP:  Marguarite Arbour, MD Pharmacy:   [facility], [place] - [address] [place] [address] Phone:  [phone] Fax: [phone]     Social Determinants of Health (SDOH) Interventions    Readmission Risk Interventions No flowsheet data found.

## 2020-05-03 NOTE — Progress Notes (Signed)
PROGRESS NOTE    Leah Small   GEZ:[id number]  DOB: [birth year]  PCP: Idelle Crouch, MD    DOA: 04/26/2020 LOS: 7   Brief Narrative   Leah Small is a 79 y.o. female with medical history significant for coronary artery disease status post CABG, status post stent angioplasty, history of hypertension, diabetes mellitus, dyslipidemia and seizure disorder who presented to the ED via EMS for evaluation of chest pain.    Her troponins were significantly elevated (peaked at 15,900).  She was admitted to the hospital for acute NSTEMI.  She also had severe anemia and thrombocytopenia with hemoglobin of 6.1 and platelet count of 35,000 respectively.  She was transfused with 2 units of packed red blood cells and H&H have remained stable.  She was on aspirin and Plavix prior to admission but these were held temporarily.  Both aspirin and Plavix have been resumed since anemia and thrombocytopenia have improved.  Hematologist was consulted, recommended bone marrow biopsy for further evaluation.    Cardiologist consulted for acute NSTEMI in setting of severe anemia and known history of coronary artery disease.  This is being medically managed, but heparin had to be deferred due to severe anemia and thrombocytopenia.  She was started on midodrine for hypotension.     Assessment & Plan   Principal Problem:   Non-STEMI (non-ST elevated myocardial infarction) ([place]) Active Problems:   Diabetes mellitus with complication (HCC)   Essential hypertension   Complex partial seizure disorder (HCC)   Pressure injury of skin   Chest pain due to CAD (HCC)   Anemia   Severe thrombocytopenia (HCC)   Pancytopenia (HCC)   Hypotension - unclear etiology, BP is quite labile.  Past 24 hours SBP range 90-141, DBP range 54-66.   --On midodrine which has been increased to 10 mg TID.   --Continue holding ramipril and cardizem for now.  Maintain MAP>=65. Patient is afebrile, no leukocytosis and asymptomatic,  doubt underlying infection at this point.  Monitor clinically.   Pancytopenia - POA, etiology to be determined. Thrombocytopenia - presented w platelet 35k >> 195k. Improved.  Did not require platelet transfusion. Anemia / History of Iron Deficiency - presented with Hbg 6.1, consistent with acute on chronic anemia. Pt has had intermittent drops in Hbg over past 6 months.  Follows with Dr. Janese Banks.  Iron deficiency was corrected outpatient.  FOBT neg. S/p 2 units pRBC's transfused 9/3. --Bone Marrow biopsy done 9/8 --Results to be discussed at outpatient follow up with Dr. Janese Banks --Trend H&H, transfuse if Hbg<7   Acute Non-STEMI / Multi-vessel CAD s/p CABG - hs[place]roponin peaked at 15[address], abnormal ECG, known history of CAD not amenable to intervention.  CABG was in 1997.  Recent cardiac cath in 01/5034 complicated by acute respiratory distress requiring intubation with findings overall unchanged from previous cath in 2017, with LVEF 35%, with severe multivessel native disease with occlusion proximal LAD,leftcircumflex, and RCA, with total occlusion of SVG to RCA, circumflex and diagonal, with patent LIMA to mid LAD. Chronic Angina    Chronic systolic CHF - Echo: EF 46-56%, moderate MR  --Cardiology consulted, signed off 9/8. --Follow up w Dr. Nehemiah Massed in 1 week --Continue ASA/Plavix, Toprol-XL, Lipitor --Ramipril and Cardizem on hold due to hypotension --Continue Ranexa for angina   Hyponatremia - Na 126>>127>>127 this AM.  Possibly chronic / intermittent, but was normal in May this year.  Has been stable 126-130 this admission.  Appears euvolemic to dry/hypovolemic.  Serum osm isotonic.  TSH, cortisol and uric acid normal.  Possibly SIADH. --Daily BMP to monitor.   --Fluid challenge today, gentle NS & repeat sodium level >> 126 --Place on fluid restriction. Suspect SIADH.   Type 2 Diabetes - on sliding scale Novolog.  Continue PRN gabapentin.  Add Levemir 8 units daily.  Essential  Hypertension - chronic.  Currently with intermittent hypotension.  Ramipril, Cardizem on hold.  Maintain MAP>65.  Continue midodrine.  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  WOC consulted.  Reposition patient every 2 hours.    Pressure Injury 12/28/19 Buttocks Left Stage 3 -  Full thickness tissue loss. Subcutaneous fat may be visible but bone, tendon or muscle are NOT exposed. (Active)  12/28/19 1800  Location: Buttocks  Location Orientation: Left  Staging: Stage 3 -  Full thickness tissue loss. Subcutaneous fat may be visible but bone, tendon or muscle are NOT exposed.  Wound Description (Comments):   Present on Admission: Yes     Pressure Injury 05/03/20 Sacrum Mid Stage 2 -  Partial thickness loss of dermis presenting as a shallow open injury with a red, pink wound bed without slough. (Active)  05/03/20 0830  Location: Sacrum  Location Orientation: Mid  Staging: Stage 2 -  Partial thickness loss of dermis presenting as a shallow open injury with a red, pink wound bed without slough.  Wound Description (Comments):   Present on Admission:       DVT prophylaxis: enoxaparin (LOVENOX) injection 40 mg Start: 05/02/20 2200   Diet:  Diet Orders (From admission, onward)    Start     Ordered   05/03/20 1428  Diet Carb Modified Fluid consistency: Thin; Room service appropriate? Yes  Diet effective now       Question Answer Comment  Diet-HS Snack? Nothing   Calorie Level Medium 1600-2000   Fluid consistency: Thin   Room service appropriate? Yes      05/03/20 1428            Code Status: DNR    Subjective 05/03/20    Patient seen at bedside today, no family in room.  Says she feels well today, better.  Denies any acute complaints including fever/chills, N/V/D, CP, SOB.  We discussed her low sodium and doing fluid trial today, she agrees.   Disposition Plan & Communication   Status is:  Inpatient  Remains Inpatient appropriate because of hyponatremia and hypotension requiring close monitor and further evaluation.  Dispo: The patient is from: home              Anticipated d/c is to: home with HHPT              Anticipated d/c date is: 1-2 days              Patient currently is NOT medically stable for d/c.   Family Communication: None at bedside today.  Son updated at bedside during encounter 9/8.   Consults, Procedures, Significant Events   Consultants:   Hematology  Cardiology  Radiology  Procedures:   Bone marrow biopsy 05/01/20  Antimicrobials:   None    Objective   Vitals:   05/03/20 0729 05/03/20 1111 05/03/20 1421 05/03/20 1526  BP: 124/63 (!) 127/56 (!) 115/50 (!) 107/54  Pulse: 77 73 65 62  Resp: _0 Temp: 98.6 F (37 C) 97.9 F (36.6 C) 97.7 F (36.5 C) 98 F (36.7 C)  TempSrc: Oral  Oral Oral Oral  SpO2: 100% 97% 98% 97%  Weight:      Height:        Intake/Output Summary (Last 24 hours) at 05/03/2020 1813 Last data filed at 05/03/2020 1536 Gross per 24 hour  Intake 1026.3 ml  Output 1350 ml  Net -323.7 ml   Filed Weights   04/28/20 0338 05/02/20 0532 05/03/20 0432  Weight: 54.6 kg 58.4 kg 55.5 kg    Physical Exam:  General exam: awake, alert, no acute distress Respiratory system: CTAB, no wheezes, rales or rhonchi, normal respiratory effort. Cardiovascular system: normal S1/S2, RRR, pedal edema.   Gastrointestinal system: soft, NT, ND, no HSM felt, +bowel sounds. Central nervous system: A&O x3. no gross focal neurologic deficits, normal speech Extremities: moves all, no cyanosis, normal tone Psychiatry: normal mood, congruent affect  Labs   Data Reviewed: I have personally reviewed following labs and imaging studies  CBC: Recent Labs  Lab 04/28/20 1101 04/28/20 1101 04/29/20 1032 04/30/20 1009 05/01/20 0515 05/02/20 0523 05/03/20 0548  WBC 3.1*   < > 3.4* 4.2 5.1 5.5 6.2  NEUTROABS 1.7  --  2.1 2.6  3.0  --   --   HGB 9.1*   < > 8.9* 10.1* 9.7* 9.2* 9.2*  HCT 24.8*   < > 25.8* 28.7* 27.7* 26.7* 26.5*  MCV 93.6   < > 95.2 94.4 95.5 96.0 96.4  PLT 42*   < > 70* 130* 195 284 411*   < > = values in this interval not displayed.   Basic Metabolic Panel: Recent Labs  Lab 04/29/20 1032 04/29/20 1032 04/30/20 1009 05/01/20 0515 05/02/20 0523 05/03/20 0548 05/03/20 1558  NA 128*   < > 126* 126* 127* 127* 126*  K 4.7  --  4.6 4.4 4.2 4.4  --   CL 100  --  98 99 98 99  --   CO2 20*  --  18* 17* 19* 18*  --   GLUCOSE 228*  --  224* 169* 186* 258*  --   BUN 32*  --  29* 32* 34* 32*  --   CREATININE 0.97  --  0.85 0.94 0.98 0.82  --   CALCIUM 8.1*  --  8.7* 8.7* 8.7* 8.7*  --   MG  --   --   --   --  1.8 1.7  --    < > = values in this interval not displayed.   GFR: Estimated Creatinine Clearance: 47.9 mL/min (by C-G formula based on SCr of 0.82 mg/dL). Liver Function Tests: No results for input(s): AST, ALT, ALKPHOS, BILITOT, PROT, ALBUMIN in the last 168 hours. No results for input(s): LIPASE, AMYLASE in the last 168 hours. No results for input(s): AMMONIA in the last 168 hours. Coagulation Profile: Recent Labs  Lab 04/30/20 0602  INR 1.3*   Cardiac Enzymes: No results for input(s): CKTOTAL, CKMB, CKMBINDEX, TROPONINI in the last 168 hours. BNP (last 3 results) No results for input(s): PROBNP in the last 8760 hours. HbA1C: No results for input(s): HGBA1C in the last 72 hours. CBG: Recent Labs  Lab 05/02/20 1841 05/02/20 2119 05/03/20 0728 05/03/20 1115 05/03/20 1630  GLUCAP 203* 296* 254* 344* 181*   Lipid Profile: No results for input(s): CHOL, HDL, LDLCALC, TRIG, CHOLHDL, LDLDIRECT in the last 72 hours. Thyroid Function Tests: Recent Labs    05/02/20 0523  TSH 1.117   Anemia Panel: No results for input(s): VITAMINB12, FOLATE, FERRITIN, TIBC, IRON, RETICCTPCT in the last 72  hours. Sepsis Labs: No results for input(s): PROCALCITON, LATICACIDVEN in the last  168 hours.  Recent Results (from the past 240 hour(s))  SARS Coronavirus 2 by RT PCR (hospital order, performed in [facility] hospital lab) Nasopharyngeal Nasopharyngeal Swab     Status: None   Collection Time: 04/26/20 11:34 AM   Specimen: Nasopharyngeal Swab  Result Value Ref Range Status   SARS Coronavirus 2 NEGATIVE NEGATIVE Final    Comment: (NOTE) SARS-CoV-2 target nucleic acids are NOT DETECTED.  The SARS-CoV-2 RNA is generally detectable in upper and lower respiratory specimens during the acute phase of infection. The lowest concentration of SARS-CoV-2 viral copies this assay can detect is 250 copies / mL. A negative result does not preclude SARS-CoV-2 infection and should not be used as the sole basis for treatment or other patient management decisions.  A negative result may occur with improper specimen collection / handling, submission of specimen other than nasopharyngeal swab, presence of viral mutation(s) within the areas targeted by this assay, and inadequate number of viral copies (<250 copies / mL). A negative result must be combined with clinical observations, patient history, and epidemiological information.  Fact Sheet for Patients:   StrictlyIdeas.no  Fact Sheet for Healthcare Providers: BankingDealers.co.za  This test is not yet approved or  cleared by the [place] FDA and has been authorized for detection and/or diagnosis of SARS-CoV-2 by FDA under an Emergency Use Authorization (EUA).  This EUA will remain in effect (meaning this test can be used) for the duration of the COVID-19 declaration under Section 564(b)(1) of the Act, 21 U.S.C. section 360bbb-3(b)(1), unless the authorization is terminated or revoked sooner.  Performed at [facility], [address] [place], [place] [address]       Imaging Studies   No results found.   Medications   Scheduled Meds: . aspirin EC  81 mg  Oral Daily  . atorvastatin  40 mg Oral Daily  . brinzolamide  1 drop Both Eyes BID   And  . brimonidine  1 drop Both Eyes BID  . clopidogrel  75 mg Oral Daily  . enoxaparin (LOVENOX) injection  40 mg Subcutaneous Q24H  . folic acid  1 mg Oral Daily  . insulin aspart  0-15 Units Subcutaneous TID WC  . insulin detemir  8 Units Subcutaneous Daily  . lamoTRIgine  100 mg Oral Daily  . levETIRAcetam  500 mg Oral BID  . mouth rinse  15 mL Mouth Rinse BID  . metoprolol succinate  25 mg Oral Daily  . midodrine  10 mg Oral TID WC  . ranolazine  1,000 mg Oral BID  . sodium chloride flush  3 mL Intravenous Q12H  . timolol  1 drop Both Eyes BID  . vitamin B-12  500 mcg Oral BH-q7a  . vitamin E  400 Units Oral BH-q7a   Continuous Infusions: . sodium chloride         LOS: 7 days    Time spent: 25 minutes    Ezekiel Slocumb, DO [facility]  05/03/2020, 6:13 PM    If 7PM-7AM, please contact night-coverage. How to contact the [facility] Attending or Consulting provider [place] or covering provider during after hours [place], for this patient?    1. Check the care team in [facility] and look for a) attending/consulting TRH provider listed and b) the [facility] team listed 2. Log into [email or web address] and use [place] universal password to access. If you do not have  the password, please contact the hospital operator. 3. Locate the [facility] provider you are looking for under [facility] and page to a number that you can be directly reached. 4. If you still have difficulty reaching the provider, please page the [facility] (Director on Call) for the Hospitalists listed on amion for assistance.

## 2020-05-03 NOTE — Progress Notes (Signed)
Mobility Specialist - Progress Note   05/03/20 1700  Mobility  Activity  (Bed exercises)  Range of Motion/Exercises Right leg;Left leg (quad sets, hip iso, straight leg raises, ankle pumps)  Level of Assistance Modified independent, requires aide device or extra time  Assistive Device None  Mobility Response Tolerated well  Mobility performed by Mobility specialist  $Mobility charge 1 Mobility    Pre-mobility: 63 HR, 94/55 BP, 98% SpO2 Post-mobility: 66 HR, 114/57 BP, 99% SpO2   Pt was sleeping in bed upon arrival. Pt was easily awakened and agreed to session. Pt stated that she felt much better than she did this morning. Pt was modI in performing bed exercises: ankle pumps, hip isometrics, quad sets, straight leg raises, and hip add/abd (10x/leg). Pt denied any pain, dizziness, or fatigue. Overall, pt tolerated session well. Pt remains in bed with phone/call bell in reach.   Filiberto Pinks Mobility Specialist 05/03/20, 5:03 PM

## 2020-05-03 NOTE — Progress Notes (Signed)
Physical Therapy Treatment Patient Details Name: Leah Small MRN: [id number] DOB: [birth year] Today's Date: 05/03/2020    History of Present Illness 79 y.o. female with medical history significant for coronary artery disease status post CABG, status post stent angioplasty, history of hypertension, diabetes mellitus, dyslipidemia and seizure disorder who presented to the ED via EMS for evaluation of chest pain.  Patient states that she has frequent anginal episodes for which she takes nitroglycerin but this morning she developed chest pain at rest which she rated 10 x 10 in intensity at its worst.  Pain radiated to her left jaw and left arm and was associated with nausea and vomiting.  Admitted with NSTEMI.    PT Comments    Pt was initially unwilling to work with PT and pleasantly refused.  She reports that she was tired from some activity this morning and staunch about not getting up/walking, but was very willing to do some supine exercises with only minimal cuing.  Pt with mild baseline L sided weakness but was able to do all requested exercises with AROM and most with light resistance.  Good effort and stable vitals t/o the effort.   Follow Up Recommendations  Home health PT;Supervision for mobility/OOB     Equipment Recommendations  None recommended by PT    Recommendations for Other Services       Precautions / Restrictions Precautions Precautions: Fall Restrictions Weight Bearing Restrictions: No    Mobility  Bed Mobility               General bed mobility comments: pt defers mobility today, only agrees to supine exercises  Transfers                    Ambulation/Gait                 Stairs             Wheelchair Mobility    Modified Rankin (Stroke Patients Only)       Balance Overall balance assessment: Modified Independent;History of Falls                                          Cognition Arousal/Alertness:  Awake/alert Behavior During Therapy: WFL for tasks assessed/performed Overall Cognitive Status: Within Functional Limits for tasks assessed                                        Exercises General Exercises - Lower Extremity Ankle Circles/Pumps: Strengthening;10 reps;Both Short Arc Quad: Strengthening;10 reps;Both Heel Slides: AROM;Strengthening;10 reps;Both (with resisted leg extensions) Hip ABduction/ADduction: Strengthening;10 reps;Both Straight Leg Raises: AROM;10 reps;Both    General Comments General comments (skin integrity, edema, etc.): Pt with some SOB and c/o fatigue with exercises, but O2 remained in the mid/high 90s on room air and HR generally in the low 70s.      Pertinent Vitals/Pain Pain Assessment: Faces Faces Pain Scale: Hurts a little bit Pain Location: c/o minimal LE pain with light palpation, nothing limiting or severe    Home Living                      Prior Function            PT Goals (current goals can now be found  in the care plan section) Progress towards PT goals: Progressing toward goals    Frequency    Min 2X/week      PT Plan Current plan remains appropriate    Co-evaluation              AM-PAC PT "6 Clicks" Mobility   Outcome Measure  Help needed turning from your back to your side while in a flat bed without using bedrails?: None Help needed moving from lying on your back to sitting on the side of a flat bed without using bedrails?: None Help needed moving to and from a bed to a chair (including a wheelchair)?: None Help needed standing up from a chair using your arms (e.g., wheelchair or bedside chair)?: A Little Help needed to walk in hospital room?: A Little Help needed climbing 3-5 steps with a railing? : A Little 6 Click Score: 21    End of Session Equipment Utilized During Treatment: Gait belt Activity Tolerance: Patient tolerated treatment well;Patient limited by fatigue Patient left:  with chair alarm set;with call bell/phone within reach Nurse Communication:  (stable and appropriate vitals t/o the session) PT Visit Diagnosis: Muscle weakness (generalized) (M62.81);Difficulty in walking, not elsewhere classified (R26.2)     Time: 6761-9509 PT Time Calculation (min) (ACUTE ONLY): 17 min  Charges:  $Therapeutic Exercise: 8-22 mins                     Malachi Pro, DPT 05/03/2020, 1:40 PM

## 2020-05-04 ENCOUNTER — Inpatient Hospital Stay: Payer: Medicare Other

## 2020-05-04 LAB — CBC
HCT: 23.4 % — ABNORMAL LOW (ref 36.0–46.0)
Hemoglobin: 8.3 g/dL — ABNORMAL LOW (ref 12.0–15.0)
MCH: 32.7 pg (ref 26.0–34.0)
MCHC: 35.5 g/dL (ref 30.0–36.0)
MCV: 92.1 fL (ref 80.0–100.0)
Platelets: 455 10*3/uL — ABNORMAL HIGH (ref 150–400)
RBC: 2.54 MIL/uL — ABNORMAL LOW (ref 3.87–5.11)
RDW: 18.1 % — ABNORMAL HIGH (ref 11.5–15.5)
WBC: 5.8 10*3/uL (ref 4.0–10.5)
nRBC: 0 % (ref 0.0–0.2)

## 2020-05-04 LAB — BASIC METABOLIC PANEL
Anion gap: 9 (ref 5–15)
BUN: 33 mg/dL — ABNORMAL HIGH (ref 8–23)
CO2: 19 mmol/L — ABNORMAL LOW (ref 22–32)
Calcium: 8.7 mg/dL — ABNORMAL LOW (ref 8.9–10.3)
Chloride: 99 mmol/L (ref 98–111)
Creatinine, Ser: 1.07 mg/dL — ABNORMAL HIGH (ref 0.44–1.00)
GFR calc Af Amer: 57 mL/min — ABNORMAL LOW (ref >60)
GFR calc non Af Amer: 49 mL/min — ABNORMAL LOW (ref >60)
Glucose, Bld: 155 mg/dL — ABNORMAL HIGH (ref 70–99)
Potassium: 4.4 mmol/L (ref 3.5–5.1)
Sodium: 127 mmol/L — ABNORMAL LOW (ref 135–145)

## 2020-05-04 LAB — PROCALCITONIN: Procalcitonin: 0.14 ng/mL

## 2020-05-04 LAB — GLUCOSE, CAPILLARY
Glucose-Capillary: 138 mg/dL — ABNORMAL HIGH (ref 70–99)
Glucose-Capillary: 256 mg/dL — ABNORMAL HIGH (ref 70–99)
Glucose-Capillary: 215 mg/dL — ABNORMAL HIGH (ref 70–99)
Glucose-Capillary: 221 mg/dL — ABNORMAL HIGH (ref 70–99)
Glucose-Capillary: 187 mg/dL — ABNORMAL HIGH (ref 70–99)

## 2020-05-04 LAB — MAGNESIUM: Magnesium: 2.2 mg/dL (ref 1.7–2.4)

## 2020-05-04 LAB — SARS CORONAVIRUS 2 BY RT PCR (HOSPITAL ORDER, PERFORMED IN ~~LOC~~ HOSPITAL LAB): SARS Coronavirus 2: POSITIVE — AB

## 2020-05-04 MED ORDER — DOCUSATE SODIUM 100 MG PO CAPS
200.0000 mg | ORAL_CAPSULE | Freq: Two times a day (BID) | ORAL | Status: DC
  Administered 2020-05-04 – 2020-05-08 (×8): 200 mg via ORAL
  Filled 2020-05-04 (×9): qty 2

## 2020-05-04 MED ORDER — BISACODYL 5 MG PO TBEC: 5.0000 mg | DELAYED_RELEASE_TABLET | Freq: Every day | ORAL | Status: DC | PRN

## 2020-05-04 MED ORDER — COLLAGENASE 250 UNIT/GM EX OINT
TOPICAL_OINTMENT | Freq: Every day | CUTANEOUS | Status: DC
  Administered 2020-05-05: 1 via TOPICAL
  Filled 2020-05-04: qty 30

## 2020-05-04 NOTE — Progress Notes (Addendum)
PROGRESS NOTE    KENZLI BARRITT   DHW:[id number]  DOB: Mar 30, 1941  PCP: Idelle Crouch, MD    DOA: 04/26/2020 LOS: 8   Brief Narrative   Leah Small is a 79 y.o. female with medical history significant for coronary artery disease status post CABG, status post stent angioplasty, history of hypertension, diabetes mellitus, dyslipidemia and seizure disorder who presented to the ED via EMS for evaluation of chest pain.    Her troponins were significantly elevated (peaked at 15,900).  She was admitted to the hospital for acute NSTEMI.  She also had severe anemia and thrombocytopenia with hemoglobin of 6.1 and platelet count of 35,000 respectively.  She was transfused with 2 units of packed red blood cells and H&H have remained stable.  She was on aspirin and Plavix prior to admission but these were held temporarily.  Both aspirin and Plavix have been resumed since anemia and thrombocytopenia have improved.  Hematologist was consulted, recommended bone marrow biopsy for further evaluation.    Cardiologist consulted for acute NSTEMI in setting of severe anemia and known history of coronary artery disease.  This is being medically managed, but heparin had to be deferred due to severe anemia and thrombocytopenia.  She was started on midodrine for hypotension.     Assessment & Plan   Principal Problem:   Non-STEMI (non-ST elevated myocardial infarction) ([place]) Active Problems:   Diabetes mellitus with complication (HCC)   Essential hypertension   Complex partial seizure disorder (HCC)   Pressure injury of skin   Chest pain due to CAD (HCC)   Anemia   Severe thrombocytopenia (HCC)   Pancytopenia (HCC)   Hypotension - unclear etiology, BP is quite labile.  Past 24 hours SBP range 90-141, DBP range 54-66.   --On midodrine which has been increased to 10 mg TID.   --Continue holding ramipril and cardizem for now.  Maintain MAP>=65. Patient is afebrile, no leukocytosis and asymptomatic,  doubt underlying infection at this point.  Monitor clinically.   Pancytopenia - POA, etiology to be determined. Thrombocytopenia - presented w platelet 35k >> 195k. Improved.  Did not require platelet transfusion. Anemia / History of Iron Deficiency - presented with Hbg 6.1, consistent with acute on chronic anemia. Pt has had intermittent drops in Hbg over past 6 months.  Follows with Dr. Janese Banks.  Iron deficiency was corrected outpatient.  FOBT neg. S/p 2 units pRBC's transfused 9/3. --Bone Marrow biopsy done 9/8 --Results to be discussed at outpatient follow up with Dr. Janese Banks --Trend H&H, transfuse if Hbg<7   Acute Non-STEMI / Multi-vessel CAD s/p CABG - hs[place]roponin [place] at 15900, abnormal ECG, known history of CAD not amenable to intervention.  CABG was in 1997.  Recent cardiac cath in 0/2111 complicated by acute respiratory distress requiring intubation with findings overall unchanged from previous cath in 2017, with LVEF 35%, with severe multivessel native disease with occlusion proximal LAD,leftcircumflex, and RCA, with total occlusion of SVG to RCA, circumflex and diagonal, with patent LIMA to mid LAD. Chronic Angina    Chronic systolic CHF - Echo: EF 55-20%, moderate MR  --Cardiology consulted, signed off 9/8. --Follow up w Dr. Nehemiah Massed in 1 week after d/c --Continue ASA/Plavix, Toprol-XL, Lipitor --Ramipril and Cardizem on hold due to hypotension --Continue Ranexa for angina   Hyponatremia - Na 126>>127>>127 this AM.  Possibly chronic / intermittent, but was normal in May this year.  Has been stable 126-130 this admission.  Appears euvolemic to dry/hypovolemic.  Serum osm  isotonic.  Suspect SIADH. TSH, cortisol and uric acid normal.  Possibly SIADH. 9/11 - obtained CT scan given SIADH picture, and prior CT from 2019 with pulmonary nodules.  It showed some ground glass nodules that are nonspecific, likely inflammatory vs infectious. --Need to rule out Covid based on CT findings, per  radiology recommendation, swab pending --Collect urine for osm's and lytes --Daily BMP to monitor.   --Fluid challenge 9/10, gentle NS & repeat sodium level lower >> 126. Stop fluids. --Place on fluid restriction.   Type 2 Diabetes - on sliding scale Novolog.  Continue PRN gabapentin.  Add Levemir 8 units daily.   Essential Hypertension - chronic.  Currently with intermittent hypotension.  Ramipril, Cardizem on hold.  Maintain MAP>65.  Continue midodrine.   Complex partial seizure disorder - continue Lamictal, Keppra   Pulmonary nodules - seen on CT in 2019.  CT chest this admission as well (see report below).   Will require surveillance in follow up.   Sacral pressure injury - present on admission.  WOC consulted.  Reposition patient every 2 hours.   Recommend wound care clinic to follow.  Pressure Injury 12/28/19 Buttocks Left Stage 3 -  Full thickness tissue loss. Subcutaneous fat may be visible but bone, tendon or muscle are NOT exposed. (Active)  12/28/19 1800  Location: Buttocks  Location Orientation: Left  Staging: Stage 3 -  Full thickness tissue loss. Subcutaneous fat may be visible but bone, tendon or muscle are NOT exposed.  Wound Description (Comments):   Present on Admission: Yes     Pressure Injury 05/03/20 Sacrum Mid Stage 2 -  Partial thickness loss of dermis presenting as a shallow open injury with a red, pink wound bed without slough. (Active)  05/03/20 0830  Location: Sacrum  Location Orientation: Mid  Staging: Stage 2 -  Partial thickness loss of dermis presenting as a shallow open injury with a red, pink wound bed without slough.  Wound Description (Comments):   Present on Admission:       DVT prophylaxis: enoxaparin (LOVENOX) injection 40 mg Start: 05/02/20 2200   Diet:  Diet Orders (From admission, onward)    Start     Ordered   05/03/20 1816  Diet Carb Modified Fluid consistency: Thin; Room service appropriate? Yes; Fluid restriction: 1200 mL  Fluid  Diet effective now       Question Answer Comment  Diet-HS Snack? Nothing   Calorie Level Medium 1600-2000   Fluid consistency: Thin   Room service appropriate? Yes   Fluid restriction: 1200 mL Fluid      05/03/20 1818            Code Status: DNR    Subjective 05/04/20    Patient seen at bedside today, son was present.  Patient says she feels good today.  Does report feeling generally weak. She says she takes care of her sacral wound at home, cleans it daily and applies ointment, and tries to change positions often.  Son expresses concern about going home.  Patient lives with her other son, but he is not always home, and patient will require supervision and assistance with any mobility until she is stronger.  We discussed reason for ordering chest CT, following up on nodules from prior scan, given low sodium might be SIADH.   Disposition Plan & Communication   Status is: Inpatient  Remains Inpatient appropriate because of hyponatremia requiring further evaluation, worsening anemia.  Dispo: The patient is from: home  Anticipated d/c is to: home with HHPT              Anticipated d/c date is: 1-2 days              Patient currently is NOT medically stable for d/c.   Family Communication: None at bedside today.  Son updated at bedside during encounter 9/8.   Consults, Procedures, Significant Events   Consultants:   Hematology  Cardiology  Radiology  Procedures:   Bone marrow biopsy 05/01/20  Antimicrobials:   None    Objective   Vitals:   05/04/20 0343 05/04/20 0730 05/04/20 1153 05/04/20 1636  BP: (!) 100/50 (!) 95/55 (!) 116/57 (!) 99/36  Pulse: 61 62  63  Resp: _0 Temp: 98.3 F (36.8 C) 97.7 F (36.5 C)  (!) 97.5 F (36.4 C)  TempSrc: Oral Oral  Oral  SpO2: 97% 96% 94% 97%  Weight: 55.4 kg     Height:        Intake/Output Summary (Last 24 hours) at 05/04/2020 1656 Last data filed at 05/04/2020 1330 Gross per 24 hour   Intake 960 ml  Output 500 ml  Net 460 ml   Filed Weights   05/02/20 0532 05/03/20 0432 05/04/20 0343  Weight: 58.4 kg 55.5 kg 55.4 kg    Physical Exam:  General exam: awake, alert, no acute distress, frail, pleasant Respiratory system: CTAB, no wheezes, rales or rhonchi, normal respiratory effort. Cardiovascular system: normal S1/S2, RRR, pedal edema.   Central nervous system: A&O x3. no gross focal neurologic deficits, normal speech Extremities: moves all, no cyanosis, normal tone Psychiatry: normal mood, congruent affect  Labs   Data Reviewed: I have personally reviewed following labs and imaging studies  CBC: Recent Labs  Lab 04/28/20 1101 04/28/20 1101 04/29/20 1032 04/29/20 1032 04/30/20 1009 05/01/20 0515 05/02/20 0523 05/03/20 0548 05/04/20 0403  WBC 3.1*   < > 3.4*   < > 4.2 5.1 5.5 6.2 5.8  NEUTROABS 1.7  --  2.1  --  2.6 3.0  --   --   --   HGB 9.1*   < > 8.9*   < > 10.1* 9.7* 9.2* 9.2* 8.3*  HCT 24.8*   < > 25.8*   < > 28.7* 27.7* 26.7* 26.5* 23.4*  MCV 93.6   < > 95.2   < > 94.4 95.5 96.0 96.4 92.1  PLT 42*   < > 70*   < > 130* 195 284 411* 455*   < > = values in this interval not displayed.   Basic Metabolic Panel: Recent Labs  Lab 04/30/20 1009 04/30/20 1009 05/01/20 0515 05/02/20 0523 05/03/20 0548 05/03/20 1558 05/04/20 0403  NA 126*   < > 126* 127* 127* 126* 127*  K 4.6  --  4.4 4.2 4.4  --  4.4  CL 98  --  99 98 99  --  99  CO2 18*  --  17* 19* 18*  --  19*  GLUCOSE 224*  --  169* 186* 258*  --  155*  BUN 29*  --  32* 34* 32*  --  33*  CREATININE 0.85  --  0.94 0.98 0.82  --  1.07*  CALCIUM 8.7*  --  8.7* 8.7* 8.7*  --  8.7*  MG  --   --   --  1.8 1.7  --  2.2   < > = values in this interval not displayed.   GFR: Estimated Creatinine  Clearance: 36.7 mL/min (A) (by C-G formula based on SCr of 1.07 mg/dL (H)). Liver Function Tests: No results for input(s): AST, ALT, ALKPHOS, BILITOT, PROT, ALBUMIN in the last 168 hours. No results  for input(s): LIPASE, AMYLASE in the last 168 hours. No results for input(s): AMMONIA in the last 168 hours. Coagulation Profile: Recent Labs  Lab 04/30/20 0602  INR 1.3*   Cardiac Enzymes: No results for input(s): CKTOTAL, CKMB, CKMBINDEX, TROPONINI in the last 168 hours. BNP (last 3 results) No results for input(s): PROBNP in the last 8760 hours. HbA1C: No results for input(s): HGBA1C in the last 72 hours. CBG: Recent Labs  Lab 05/03/20 1630 05/03/20 2108 05/04/20 0727 05/04/20 1208 05/04/20 1630  GLUCAP 181* 151* 138* 256* 215*   Lipid Profile: No results for input(s): CHOL, HDL, LDLCALC, TRIG, CHOLHDL, LDLDIRECT in the last 72 hours. Thyroid Function Tests: Recent Labs    05/02/20 0523  TSH 1.117   Anemia Panel: No results for input(s): VITAMINB12, FOLATE, FERRITIN, TIBC, IRON, RETICCTPCT in the last 72 hours. Sepsis Labs: Recent Labs  Lab 05/04/20 0403  PROCALCITON 0.14    Recent Results (from the past 240 hour(s))  SARS Coronavirus 2 by RT PCR (hospital order, performed in [facility] hospital lab) Nasopharyngeal Nasopharyngeal Swab     Status: None   Collection Time: 04/26/20 11:34 AM   Specimen: Nasopharyngeal Swab  Result Value Ref Range Status   SARS Coronavirus 2 NEGATIVE NEGATIVE Final    Comment: (NOTE) SARS-CoV-2 target nucleic acids are NOT DETECTED.  The SARS-CoV-2 RNA is generally detectable in upper and lower respiratory specimens during the acute phase of infection. The lowest concentration of SARS-CoV-2 viral copies this assay can detect is 250 copies / mL. A negative result does not preclude SARS-CoV-2 infection and should not be used as the sole basis for treatment or other patient management decisions.  A negative result may occur with improper specimen collection / handling, submission of specimen other than nasopharyngeal swab, presence of viral mutation(s) within the areas targeted by this assay, and inadequate number of viral  copies (<250 copies / mL). A negative result must be combined with clinical observations, patient history, and epidemiological information.  Fact Sheet for Patients:   StrictlyIdeas.no  Fact Sheet for Healthcare Providers: BankingDealers.co.za  This test is not yet approved or  cleared by the [place] FDA and has been authorized for detection and/or diagnosis of SARS-CoV-2 by FDA under an Emergency Use Authorization (EUA).  This EUA will remain in effect (meaning this test can be used) for the duration of the COVID-19 declaration under Section 564(b)(1) of the Act, 21 U.S.C. section 360bbb-3(b)(1), unless the authorization is terminated or revoked sooner.  Performed at [facility], [address] [place], [place] [address]       Imaging Studies   CT CHEST WO CONTRAST  Result Date: 05/04/2020 CLINICAL DATA:  Lung nodule follow-up seen on prior imaging in 2019 EXAM: CT CHEST WITHOUT CONTRAST TECHNIQUE: Multidetector CT imaging of the chest was performed following the standard protocol without IV contrast. COMPARISON:  02/14/2018 FINDINGS: Cardiovascular: Calcific atheromatous plaque of the thoracic aorta. Cardiac enlargement, larger than on the prior study though low lung volumes could accentuate cardiac size. Mitral annular calcification. Extensive coronary artery calcification and signs of CABG following median sternotomy with similar appearance. Central pulmonary vasculature mildly engorged, less than 3 cm greatest caliber. Limited assessment of cardiovascular structures given lack of intravenous contrast. Mediastinum/Nodes: No axillary, mediastinal or thoracic inlet  lymphadenopathy. Hilar structures without gross nodal enlargement. Lungs/Pleura: Numerous ill-defined ground-glass nodules with upper lobe predominance. No septal thickening. No consolidative change. No pleural effusion.  Midlung is involved bilaterally as well.  A 1.3 by 0.8 cm area of amidst ground-glass changes in the RIGHT upper lobe on image 50 of series 4 serves as an example of this process with numerous scattered sub solid areas amidst ground-glass changes. No significant bronchial wall thickening Upper Abdomen: Post cholecystectomy. No acute process in the upper abdomen. Limited assessment. Musculoskeletal: Spinal degenerative change. No acute or destructive bone finding. IMPRESSION: 1. Numerous ill-defined ground-glass nodules with upper lobe predominance. A 1.3 by 0.8 cm area of amidst ground-glass changes in the RIGHT upper lobe on image 50 of series 4 serves as an example of this process with numerous scattered sub solid areas amidst ground-glass changes. Findings are nonspecific but favor an infectious or inflammatory process. Hypersensitivity pneumonitis could also be considered. Distribution is not classic for COVID-19 pneumonia but this should be excluded as well. Follow-up is suggested to ensure resolution of above findings. 2. Suspected increase in cardiac size with extensive coronary artery disease and signs of prior CABG. No pericardial effusion. 3. Aortic atherosclerosis. Aortic Atherosclerosis (ICD10-I70.0). Electronically Signed   By: Zetta Bills M.D.   On: 05/04/2020 11:58     Medications   Scheduled Meds: . aspirin EC  81 mg Oral Daily  . atorvastatin  40 mg Oral Daily  . brinzolamide  1 drop Both Eyes BID   And  . brimonidine  1 drop Both Eyes BID  . clopidogrel  75 mg Oral Daily  . collagenase   Topical Daily  . docusate sodium  200 mg Oral BID  . enoxaparin (LOVENOX) injection  40 mg Subcutaneous Q24H  . folic acid  1 mg Oral Daily  . insulin aspart  0-15 Units Subcutaneous TID WC  . insulin detemir  8 Units Subcutaneous Daily  . lamoTRIgine  100 mg Oral Daily  . levETIRAcetam  500 mg Oral BID  . mouth rinse  15 mL Mouth Rinse BID  . metoprolol succinate  25 mg Oral Daily  . midodrine  10 mg Oral TID WC  . ranolazine   1,000 mg Oral BID  . sodium chloride flush  3 mL Intravenous Q12H  . timolol  1 drop Both Eyes BID  . vitamin B-12  500 mcg Oral BH-q7a  . vitamin E  400 Units Oral BH-q7a   Continuous Infusions: . sodium chloride         LOS: 8 days    Time spent: 45 minutes with > 50% spent in coordination of care and direct patient contact    Ezekiel Slocumb, DO [facility]  05/04/2020, 4:56 PM    If 7PM-7AM, please contact night-coverage. How to contact the [facility] Attending or Consulting provider [place] or covering provider during after hours [place], for this patient?    1. Check the care team in [facility] and look for a) attending/consulting [facility] provider listed and b) the [facility] team listed 2. Log into [email or web address] and use [place]'s universal password to access. If you do not have the password, please contact the hospital operator. 3. Locate the [facility] provider you are looking for under [facility] and page to a number that you can be directly reached. 4. If you still have difficulty reaching the provider, please page the [facility] (Director on Call) for the Hospitalists listed on amion for assistance.

## 2020-05-04 NOTE — Consult Note (Signed)
WOC Nurse Consult Note: Reason for Consult:sacral pressure injury, Unstageable.  Full thickness, expect Stage 3 after nonviable slough is removed. Wound type:Pressure Pressure Injury POA: Yes Measurement:3cm x 2cm with depth unable to be determined due to the presence of yellow slough Wound bed:see above Drainage (amount, consistency, odor) Small amount of light yellow exudate consistent with autolytically debriding nonviable tissue Periwound:intact Dressing procedure/placement/frequency: I will implement a POC consisting of enzymatic debridement using collagenase (Santyl) to the wound once daily. Patient is being turned and repositioned from side to side but favors the supine position.  A pressure redistribution chair pad is provided for her use upon discharge when OOB in a chair.  WOC nursing team will not follow, but will remain available to this patient, the nursing and medical teams.  Please re-consult if needed. Thanks, Ladona Mow, MSN, RN, GNP, [place]  Pager# [phone]

## 2020-05-04 NOTE — Plan of Care (Signed)
°  Problem: Clinical Measurements: °Goal: Cardiovascular complication will be avoided °Outcome: Progressing °  °Problem: Activity: °Goal: Risk for activity intolerance will decrease °Outcome: Progressing °  °

## 2020-05-04 NOTE — Progress Notes (Signed)
Mobility Specialist - Progress Note   05/04/20 1536  Mobility  Activity Transferred:  Bed to chair  Level of Assistance Contact guard assist, steadying assist  Assistive Device Front wheel walker  Distance Ambulated (ft) 5 ft  Mobility Response Tolerated well  Mobility performed by Mobility specialist  $Mobility charge 1 Mobility    Pre-mobility: 63 HR, 97% BP, 93/45 SpO2 During mobility: 67 HR, 91/50 BP, 98% SpO2 Post-mobility: 66 HR, 86/47 BP, 99% SpO2   Pt asleep in bed upon arrival. Pt easily awakens. Pt agreed to session. Pt SBA transitioning supine to sitting EOB. Pt transferred from EOB to recliner using a RW w/ CGA. Pt had a slow and steady gait during transfer. Pt needed verbal cues to maintain stability during gait. Pt did state she was feeling "winded" after transfer. Pt's BP was 86/47 at the end of session. Nurse was notified. Per nurse, pt would be best to be left in bed to be easily cared for. However, pt request she be left in recliner and wanted "to sit in the chair for as long as I can". BP retaken, sitting at 105/57 asymptomatic. Nurse was notified. Pt left in recliner w/ chair alarm set. Call bell and phone placed in reach.    Charod Slawinski Mobility Specialist  05/04/20, 3:43 PM

## 2020-05-05 DIAGNOSIS — U071 COVID-19: Secondary | ICD-10-CM | POA: Diagnosis not present

## 2020-05-05 LAB — CBC
HCT: 23.2 % — ABNORMAL LOW (ref 36.0–46.0)
Hemoglobin: 8.5 g/dL — ABNORMAL LOW (ref 12.0–15.0)
MCH: 33.9 pg (ref 26.0–34.0)
MCHC: 36.6 g/dL — ABNORMAL HIGH (ref 30.0–36.0)
MCV: 92.4 fL (ref 80.0–100.0)
Platelets: 541 10*3/uL — ABNORMAL HIGH (ref 150–400)
RBC: 2.51 MIL/uL — ABNORMAL LOW (ref 3.87–5.11)
RDW: 18.1 % — ABNORMAL HIGH (ref 11.5–15.5)
WBC: 6.6 10*3/uL (ref 4.0–10.5)
nRBC: 0 % (ref 0.0–0.2)

## 2020-05-05 LAB — BASIC METABOLIC PANEL
Anion gap: 7 (ref 5–15)
BUN: 37 mg/dL — ABNORMAL HIGH (ref 8–23)
CO2: 21 mmol/L — ABNORMAL LOW (ref 22–32)
Calcium: 8.9 mg/dL (ref 8.9–10.3)
Chloride: 99 mmol/L (ref 98–111)
Creatinine, Ser: 1.07 mg/dL — ABNORMAL HIGH (ref 0.44–1.00)
GFR calc Af Amer: 57 mL/min — ABNORMAL LOW (ref >60)
GFR calc non Af Amer: 49 mL/min — ABNORMAL LOW (ref >60)
Glucose, Bld: 167 mg/dL — ABNORMAL HIGH (ref 70–99)
Potassium: 4.5 mmol/L (ref 3.5–5.1)
Sodium: 127 mmol/L — ABNORMAL LOW (ref 135–145)

## 2020-05-05 LAB — GLUCOSE, CAPILLARY
Glucose-Capillary: 164 mg/dL — ABNORMAL HIGH (ref 70–99)
Glucose-Capillary: 169 mg/dL — ABNORMAL HIGH (ref 70–99)
Glucose-Capillary: 147 mg/dL — ABNORMAL HIGH (ref 70–99)
Glucose-Capillary: 108 mg/dL — ABNORMAL HIGH (ref 70–99)
Glucose-Capillary: 169 mg/dL — ABNORMAL HIGH (ref 70–99)

## 2020-05-05 LAB — NA AND K (SODIUM & POTASSIUM), RAND UR
Potassium Urine: 40 mmol/L
Sodium, Ur: <10 mmol/L

## 2020-05-05 LAB — OSMOLALITY, URINE: Osmolality, Ur: 555 mOsm/kg (ref 300–900)

## 2020-05-05 MED ORDER — FAMOTIDINE IN NACL 20-0.9 MG/50ML-% IV SOLN: 20.0000 mg | Freq: Once | INTRAVENOUS | Status: DC | PRN

## 2020-05-05 MED ORDER — DIPHENHYDRAMINE HCL 50 MG/ML IJ SOLN
50.0000 mg | Freq: Once | INTRAMUSCULAR | Status: DC | PRN
  Filled 2020-05-05: qty 1

## 2020-05-05 MED ORDER — SODIUM CHLORIDE 0.9 % IV SOLN: INTRAVENOUS | Status: DC | PRN

## 2020-05-05 MED ORDER — ALBUTEROL SULFATE HFA 108 (90 BASE) MCG/ACT IN AERS
2.0000 | INHALATION_SPRAY | Freq: Once | RESPIRATORY_TRACT | Status: DC | PRN
  Filled 2020-05-05: qty 6.7

## 2020-05-05 MED ORDER — SODIUM CHLORIDE 0.9 % IV SOLN
1200.0000 mg | Freq: Once | INTRAVENOUS | Status: AC
  Administered 2020-05-05: 1200 mg via INTRAVENOUS
  Filled 2020-05-05: qty 1200

## 2020-05-05 MED ORDER — METHYLPREDNISOLONE SODIUM SUCC 125 MG IJ SOLR
125.0000 mg | Freq: Once | INTRAMUSCULAR | Status: DC | PRN
  Filled 2020-05-05: qty 2

## 2020-05-05 MED ORDER — EPINEPHRINE 0.3 MG/0.3ML IJ SOAJ
0.3000 mg | Freq: Once | INTRAMUSCULAR | Status: DC | PRN
  Filled 2020-05-05: qty 0.3

## 2020-05-05 NOTE — Plan of Care (Signed)
  Problem: Health Behavior/Discharge Planning: Goal: Ability to manage health-related needs will improve Outcome: Progressing   Problem: Pain Managment: Goal: General experience of comfort will improve Outcome: Progressing   Problem: Safety: Goal: Ability to remain free from injury will improve Outcome: Progressing   

## 2020-05-05 NOTE — Progress Notes (Signed)
Held patient's midodrine due to BP being high.   Per MD- hold parameters are:   If sBP >110 and dBP > 55 mmHg.

## 2020-05-05 NOTE — Progress Notes (Signed)
Mobility Specialist - Progress Note   05/05/20 1343  Mobility  Activity  (bed exercises)  Range of Motion/Exercises Right leg;Left leg  Level of Assistance Modified independent, requires aide device or extra time  Assistive Device None  Distance Ambulated (ft) 0 ft  Mobility Response Tolerated well  Mobility performed by Mobility specialist  $Mobility charge 1 Mobility    Pre-mobility: 65 HR, 121/63 BP, 97% SpO2 Post-mobility: 65 HR, 125/59 BP, 97% SpO2   Pt was lying in bed upon arrival. Pt agreed to session. Pt denied any pain, dizziness, or fatigue. Pt c/o feeling bloated. Pt was able to perform ankle pumps (15x/leg), straight leg raises, and hip add/abd (10x/leg). No heavy breathing noted. Pt stated "I feel like I just went for a run and gave out." Pt rated her RPE this session a "8/10". Overall, pt tolerated session well. Pt remains in bed with all needs in reach. Nurse was notified.    Leah Small Mobility Specialist 05/05/20, 1:48 PM

## 2020-05-05 NOTE — Progress Notes (Signed)
U/A was collected and sent to the lab to check for urine osmolality and NA and K check. Will notify incoming shift. Will continue to monitor.

## 2020-05-05 NOTE — Progress Notes (Signed)
PROGRESS NOTE    Leah Small   MGQ:[id number]  DOB: [birth year]  PCP: Idelle Crouch, MD    DOA: 04/26/2020 LOS: 9   Brief Narrative   Leah Small is a 79 y.o. female with medical history significant for coronary artery disease status post CABG, status post stent angioplasty, history of hypertension, diabetes mellitus, dyslipidemia and seizure disorder who presented to the ED via EMS for evaluation of chest pain.    Her troponins were significantly elevated (peaked at 15,900).  She was admitted to the hospital for acute NSTEMI.  She also had severe anemia and thrombocytopenia with hemoglobin of 6.1 and platelet count of 35,000 respectively.  She was transfused with 2 units of packed red blood cells and H&H have remained stable.  She was on aspirin and Plavix prior to admission but these were held temporarily.  Both aspirin and Plavix have been resumed since anemia and thrombocytopenia have improved.  Hematologist was consulted, recommended bone marrow biopsy for further evaluation.    Cardiologist consulted for acute NSTEMI in setting of severe anemia and known history of coronary artery disease.  This is being medically managed, but heparin had to be deferred due to severe anemia and thrombocytopenia.  She was started on midodrine for hypotension.     Assessment & Plan   Principal Problem:   Non-STEMI (non-ST elevated myocardial infarction) ([place]) Active Problems:   Diabetes mellitus with complication (HCC)   Essential hypertension   Complex partial seizure disorder (HCC)   Pressure injury of skin   Chest pain due to CAD (HCC)   Anemia   Severe thrombocytopenia (HCC)   Pancytopenia (HCC)   Hypotension - unclear etiology, BP is quite labile.  Running higher today, have had to hold midodrine.[name]   --On midodrine which has been increased to 10 mg TID.   --Hold parameters added - hold midodrine if SBP>110 and DBP>60 --Continue holding ramipril and cardizem for now, might be  able to resume tomorrow.   --Maintain MAP>=65.   Covid-19 Positive, Incidental finding - during evaluation of hyponatremia / SIADH, CT chest was obtained which showed scatter ground glass nodular patches, with recommendation to check for Covid.  Covid-19 PCR returned positive on 9/11 (was negative on admission 9/3).  Patient has received both Covid vaccines.  She also had Covid earlier this year.   --candidate for monoclonal antibody therapy given she is asymptomatic and incidental finding. --discussed risks/benefits in detail with patient, then with son by phone, they consent to receive it --9/12 received monoclonal antibodies --monitor closely   Pancytopenia - POA, etiology to be determined. Thrombocytopenia - presented w platelet 35k >> 195k. Improved.  Did not require platelet transfusion. Anemia / History of Iron Deficiency - presented with Hbg 6.1, consistent with acute on chronic anemia. Pt has had intermittent drops in Hbg over past 6 months.  Follows with Dr. Janese Banks.  Iron deficiency was corrected outpatient.  FOBT neg. S/p 2 units pRBC's transfused 9/3. --Bone Marrow biopsy done 9/8 --Results to be discussed at outpatient follow up with Dr. Janese Banks --Trend H&H, transfuse if Hbg<7   Acute Non-STEMI / Multi-vessel CAD s/p CABG - hs[place]roponin peaked at 15[address], abnormal ECG, known history of CAD not amenable to intervention.  CABG was in 1997.  Recent cardiac cath in 09/6710 complicated by acute respiratory distress requiring intubation with findings overall unchanged from previous cath in 2017, with LVEF 35%, with severe multivessel native disease with occlusion proximal LAD,leftcircumflex, and RCA, with total occlusion of  SVG to RCA, circumflex and diagonal, with patent LIMA to mid LAD. Chronic Angina    Chronic systolic CHF - Echo: EF 73-71%, moderate MR  --Cardiology consulted, signed off 9/8. --Follow up w Dr. Nehemiah Massed in 1 week after d/c --Continue ASA/Plavix, Toprol-XL,  Lipitor --Ramipril and Cardizem on hold due to hypotension --Continue Ranexa for angina   Hyponatremia - Na 126>>127>>127 this AM.  Has been stable in upper 120's.   Possibly chronic / intermittent, but was normal in May this year.   Appears euvolemic to dry/hypovolemic.  Serum osm isotonic, urine concentrated with high urine sodium.  Suspect SIADH. TSH, cortisol and uric acid normal.   9/11 - obtained CT scan given SIADH picture, and prior CT from 2019 with pulmonary nodules.  It showed some ground glass nodules that are nonspecific, likely inflammatory vs infectious.  Subsequently test + for Covid as above. --Daily BMP to monitor.   --Continue fluid restriction.   Type 2 Diabetes - on sliding scale Novolog.  Continue PRN gabapentin.  Add Levemir 8 units daily.   Essential Hypertension - chronic.  Currently with intermittent hypotension.  Ramipril, Cardizem on hold.  Maintain MAP>65.  Continue midodrine.   Complex partial seizure disorder - continue Lamictal, Keppra   Pulmonary nodules - seen on CT in 2019.  CT chest this admission as well (see report below).   Will require surveillance in follow up.   Sacral pressure injury - present on admission.  WOC consulted.  Reposition patient every 2 hours.   Recommend wound care clinic to follow.  Pressure Injury 12/28/19 Buttocks Left Stage 3 -  Full thickness tissue loss. Subcutaneous fat may be visible but bone, tendon or muscle are NOT exposed. (Active)  12/28/19 1800  Location: Buttocks  Location Orientation: Left  Staging: Stage 3 -  Full thickness tissue loss. Subcutaneous fat may be visible but bone, tendon or muscle are NOT exposed.  Wound Description (Comments):   Present on Admission: Yes     Pressure Injury 05/03/20 Sacrum Mid Stage 2 -  Partial thickness loss of dermis presenting as a shallow open injury with a red, pink wound bed without slough. (Active)  05/03/20 0830  Location: Sacrum  Location Orientation: Mid   Staging: Stage 2 -  Partial thickness loss of dermis presenting as a shallow open injury with a red, pink wound bed without slough.  Wound Description (Comments):   Present on Admission:       DVT prophylaxis: enoxaparin (LOVENOX) injection 40 mg Start: 05/02/20 2200   Diet:  Diet Orders (From admission, onward)    Start     Ordered   05/03/20 1816  Diet Carb Modified Fluid consistency: Thin; Room service appropriate? Yes; Fluid restriction: 1200 mL Fluid  Diet effective now       Question Answer Comment  Diet-HS Snack? Nothing   Calorie Level Medium 1600-2000   Fluid consistency: Thin   Room service appropriate? Yes   Fluid restriction: 1200 mL Fluid      05/03/20 1818            Code Status: DNR    Subjective 05/05/20    Patient seen at bedside today.  She test Covid+ yesterday, says she is upset by that, understandably.  She denies respiratory or GI symptoms.  Her only complaint is feeling weak.  No fever/chils.    We discussed risks/benefits of monoclonal antibodies, to reduce risk of becoming severely ill as she is high risk.  She was agreeable but  asks I speak with her son, Timmothy Sours.  We spoke by phone and was also agreeable.   Disposition Plan & Communication   Status is: Inpatient  Remains Inpatient appropriate because of hyponatremia requiring further evaluation, worsening anemia.  Dispo: The patient is from: home              Anticipated d/c is to: home with HHPT              Anticipated d/c date is: 1-2 days              Patient currently is NOT medically stable for d/c.   Family Communication: None at bedside today.  Son updated at bedside during encounter 9/8.   Consults, Procedures, Significant Events   Consultants:   Hematology  Cardiology  Radiology  Procedures:   Bone marrow biopsy 05/01/20  Antimicrobials:   None    Objective   Vitals:   05/05/20 1443 05/05/20 1501 05/05/20 1517 05/05/20 1612  BP: (!) 99/50 (!) 107/51 (!) 107/58  123/64  Pulse: 65 63 63 63  Resp: (!) _0 Temp: 97.8 F (36.6 C) (!) 97.5 F (36.4 C) (!) 97.5 F (36.4 C) 97.9 F (36.6 C)  TempSrc: Oral Axillary Oral   SpO2: 96% 96% 98% 97%  Weight:      Height:        Intake/Output Summary (Last 24 hours) at 05/05/2020 1635 Last data filed at 05/05/2020 0934 Gross per 24 hour  Intake 360 ml  Output 200 ml  Net 160 ml   Filed Weights   05/03/20 0432 05/04/20 0343 05/05/20 0446  Weight: 55.5 kg 55.4 kg 57.3 kg    Physical Exam:  General exam: awake, alert, no acute distress, frail, pleasant Respiratory system: CTAB, diminished bases, no wheezes, rales or rhonchi, normal respiratory effort. Cardiovascular system: normal S1/S2, RRR, pedal edema.   Central nervous system: A&O x3. no gross focal neurologic deficits, normal speech Extremities: moves all, no cyanosis, normal tone Psychiatry: normal mood, congruent affect  Labs   Data Reviewed: I have personally reviewed following labs and imaging studies  CBC: Recent Labs  Lab 04/29/20 1032 04/29/20 1032 04/30/20 1009 04/30/20 1009 05/01/20 0515 05/02/20 0523 05/03/20 0548 05/04/20 0403 05/05/20 0425  WBC 3.4*   < > 4.2   < > 5.1 5.5 6.2 5.8 6.6  NEUTROABS 2.1  --  2.6  --  3.0  --   --   --   --   HGB 8.9*   < > 10.1*   < > 9.7* 9.2* 9.2* 8.3* 8.5*  HCT 25.8*   < > 28.7*   < > 27.7* 26.7* 26.5* 23.4* 23.2*  MCV 95.2   < > 94.4   < > 95.5 96.0 96.4 92.1 92.4  PLT 70*   < > 130*   < > 195 284 411* 455* 541*   < > = values in this interval not displayed.   Basic Metabolic Panel: Recent Labs  Lab 05/01/20 0515 05/01/20 0515 05/02/20 0523 05/03/20 0548 05/03/20 1558 05/04/20 0403 05/05/20 0425  NA 126*   < > 127* 127* 126* 127* 127*  K 4.4  --  4.2 4.4  --  4.4 4.5  CL 99  --  98 99  --  99 99  CO2 17*  --  19* 18*  --  19* 21*  GLUCOSE 169*  --  186* 258*  --  155* 167*  BUN 32*  --  34* 32*  --  33* 37*  CREATININE 0.94  --  0.98 0.82  --  1.07* 1.07*   CALCIUM 8.7*  --  8.7* 8.7*  --  8.7* 8.9  MG  --   --  1.8 1.7  --  2.2  --    < > = values in this interval not displayed.   GFR: Estimated Creatinine Clearance: 37.9 mL/min (A) (by C-G formula based on SCr of 1.07 mg/dL (H)). Liver Function Tests: No results for input(s): AST, ALT, ALKPHOS, BILITOT, PROT, ALBUMIN in the last 168 hours. No results for input(s): LIPASE, AMYLASE in the last 168 hours. No results for input(s): AMMONIA in the last 168 hours. Coagulation Profile: Recent Labs  Lab 04/30/20 0602  INR 1.3*   Cardiac Enzymes: No results for input(s): CKTOTAL, CKMB, CKMBINDEX, TROPONINI in the last 168 hours. BNP (last 3 results) No results for input(s): PROBNP in the last 8760 hours. HbA1C: No results for input(s): HGBA1C in the last 72 hours. CBG: Recent Labs  Lab 05/04/20 2109 05/04/20 2319 05/05/20 0443 05/05/20 0844 05/05/20 1216  GLUCAP 221* 187* 164* 169* 147*   Lipid Profile: No results for input(s): CHOL, HDL, LDLCALC, TRIG, CHOLHDL, LDLDIRECT in the last 72 hours. Thyroid Function Tests: No results for input(s): TSH, T4TOTAL, FREET4, T3FREE, THYROIDAB in the last 72 hours. Anemia Panel: No results for input(s): VITAMINB12, FOLATE, FERRITIN, TIBC, IRON, RETICCTPCT in the last 72 hours. Sepsis Labs: Recent Labs  Lab 05/04/20 0403  PROCALCITON 0.14    Recent Results (from the past 240 hour(s))  SARS Coronavirus 2 by RT PCR (hospital order, performed in [facility] hospital lab) Nasopharyngeal Nasopharyngeal Swab     Status: None   Collection Time: 04/26/20 11:34 AM   Specimen: Nasopharyngeal Swab  Result Value Ref Range Status   SARS Coronavirus 2 NEGATIVE NEGATIVE Final    Comment: (NOTE) SARS-CoV-2 target nucleic acids are NOT DETECTED.  The SARS-CoV-2 RNA is generally detectable in upper and lower respiratory specimens during the acute phase of infection. The lowest concentration of SARS-CoV-2 viral copies this assay can detect is  250 copies / mL. A negative result does not preclude SARS-CoV-2 infection and should not be used as the sole basis for treatment or other patient management decisions.  A negative result may occur with improper specimen collection / handling, submission of specimen other than nasopharyngeal swab, presence of viral mutation(s) within the areas targeted by this assay, and inadequate number of viral copies (<250 copies / mL). A negative result must be combined with clinical observations, patient history, and epidemiological information.  Fact Sheet for Patients:   StrictlyIdeas.no  Fact Sheet for Healthcare Providers: BankingDealers.co.za  This test is not yet approved or  cleared by the [place] FDA and has been authorized for detection and/or diagnosis of SARS-CoV-2 by FDA under an Emergency Use Authorization (EUA).  This EUA will remain in effect (meaning this test can be used) for the duration of the COVID-19 declaration under Section 564(b)(1) of the Act, 21 U.S.C. section 360bbb-3(b)(1), unless the authorization is terminated or revoked sooner.  Performed at [facility], [address] [place], [place] [address]   SARS Coronavirus 2 by RT PCR (hospital order, performed in [facility] hospital lab) Nasopharyngeal Nasopharyngeal Swab     Status: Abnormal   Collection Time: 05/04/20  5:17 PM   Specimen: Nasopharyngeal Swab  Result Value Ref Range Status   SARS Coronavirus 2 POSITIVE (A) NEGATIVE Final  Comment: RESULT CALLED TO, READ BACK BY AND VERIFIED WITH: DIANE ZAENGLE AT [place] 05/04/20.PMF (NOTE) SARS-CoV-2 target nucleic acids are DETECTED  SARS-CoV-2 RNA is generally detectable in upper respiratory specimens  during the acute phase of infection.  Positive results are indicative  of the presence of the identified virus, but do not rule out bacterial infection or co-infection with other pathogens  not detected by the test.  Clinical correlation with patient history and  other diagnostic information is necessary to determine patient infection status.  The expected result is negative.  Fact Sheet for Patients:   StrictlyIdeas.no   Fact Sheet for Healthcare Providers:   BankingDealers.co.za    This test is not yet approved or cleared by the [place] FDA and  has been authorized for detection and/or diagnosis of SARS-CoV-2 by FDA under an Emergency Use Authorization (EUA).  This EUA will remain in effect (meaning this tes t can be used) for the duration of  the COVID-19 declaration under Section 564(b)(1) of the Act, 21 U.S.C. section 360-bbb-3(b)(1), unless the authorization is terminated or revoked sooner.  Performed at [facility], [address] [place], [place] [address]       Imaging Studies   CT CHEST WO CONTRAST  Result Date: 05/04/2020 CLINICAL DATA:  Lung nodule follow-up seen on prior imaging in 2019 EXAM: CT CHEST WITHOUT CONTRAST TECHNIQUE: Multidetector CT imaging of the chest was performed following the standard protocol without IV contrast. COMPARISON:  02/14/2018 FINDINGS: Cardiovascular: Calcific atheromatous plaque of the thoracic aorta. Cardiac enlargement, larger than on the prior study though low lung volumes could accentuate cardiac size. Mitral annular calcification. Extensive coronary artery calcification and signs of CABG following median sternotomy with similar appearance. Central pulmonary vasculature mildly engorged, less than 3 cm greatest caliber. Limited assessment of cardiovascular structures given lack of intravenous contrast. Mediastinum/Nodes: No axillary, mediastinal or thoracic inlet lymphadenopathy. Hilar structures without gross nodal enlargement. Lungs/Pleura: Numerous ill-defined ground-glass nodules with upper lobe predominance. No septal thickening. No consolidative change. No  pleural effusion.  Midlung is involved bilaterally as well. A 1.3 by 0.8 cm area of amidst ground-glass changes in the RIGHT upper lobe on image 50 of series 4 serves as an example of this process with numerous scattered sub solid areas amidst ground-glass changes. No significant bronchial wall thickening Upper Abdomen: Post cholecystectomy. No acute process in the upper abdomen. Limited assessment. Musculoskeletal: Spinal degenerative change. No acute or destructive bone finding. IMPRESSION: 1. Numerous ill-defined ground-glass nodules with upper lobe predominance. A 1.3 by 0.8 cm area of amidst ground-glass changes in the RIGHT upper lobe on image 50 of series 4 serves as an example of this process with numerous scattered sub solid areas amidst ground-glass changes. Findings are nonspecific but favor an infectious or inflammatory process. Hypersensitivity pneumonitis could also be considered. Distribution is not classic for COVID-19 pneumonia but this should be excluded as well. Follow-up is suggested to ensure resolution of above findings. 2. Suspected increase in cardiac size with extensive coronary artery disease and signs of prior CABG. No pericardial effusion. 3. Aortic atherosclerosis. Aortic Atherosclerosis (ICD10-I70.0). Electronically Signed   By: Zetta Bills M.D.   On: 05/04/2020 11:58     Medications   Scheduled Meds: . aspirin EC  81 mg Oral Daily  . atorvastatin  40 mg Oral Daily  . brinzolamide  1 drop Both Eyes BID   And  . brimonidine  1 drop Both Eyes BID  . clopidogrel  75 mg Oral  Daily  . collagenase   Topical Daily  . docusate sodium  200 mg Oral BID  . enoxaparin (LOVENOX) injection  40 mg Subcutaneous Q24H  . folic acid  1 mg Oral Daily  . insulin aspart  0-15 Units Subcutaneous TID WC  . insulin detemir  8 Units Subcutaneous Daily  . lamoTRIgine  100 mg Oral Daily  . levETIRAcetam  500 mg Oral BID  . mouth rinse  15 mL Mouth Rinse BID  . metoprolol succinate  25 mg  Oral Daily  . midodrine  10 mg Oral TID WC  . ranolazine  1,000 mg Oral BID  . sodium chloride flush  3 mL Intravenous Q12H  . timolol  1 drop Both Eyes BID  . vitamin B-12  500 mcg Oral BH-q7a  . vitamin E  400 Units Oral BH-q7a   Continuous Infusions: . sodium chloride    . sodium chloride    . famotidine (PEPCID) IV         LOS: 9 days    Time spent: 36 minutes with > 50% spent in coordination of care and direct patient contact    Ezekiel Slocumb, DO [facility]  05/05/2020, 4:35 PM    If 7PM-7AM, please contact night-coverage. How to contact the [facility] Attending or Consulting provider [place] or covering provider during after hours [place], for this patient?    1. Check the care team in [facility] and look for a) attending/consulting [facility] provider listed and b) the [facility] team listed 2. Log into [email or web address] and use [place]'s universal password to access. If you do not have the password, please contact the hospital operator. 3. Locate the [place] provider you are looking for under [facility] and page to a number that you can be directly reached. 4. If you still have difficulty reaching the provider, please page the [facility] (Director on Call) for the Hospitalists listed on amion for assistance.

## 2020-05-06 ENCOUNTER — Other Ambulatory Visit: Payer: Medicare Other

## 2020-05-06 ENCOUNTER — Encounter: Payer: Self-pay | Admitting: Oncology

## 2020-05-06 LAB — CBC WITH DIFFERENTIAL/PLATELET
Abs Immature Granulocytes: 0.45 10*3/uL — ABNORMAL HIGH (ref 0.00–0.07)
Basophils Absolute: 0.1 10*3/uL (ref 0.0–0.1)
Basophils Relative: 1 %
Eosinophils Absolute: 0.1 10*3/uL (ref 0.0–0.5)
Eosinophils Relative: 2 %
HCT: 24.1 % — ABNORMAL LOW (ref 36.0–46.0)
Hemoglobin: 8.6 g/dL — ABNORMAL LOW (ref 12.0–15.0)
Immature Granulocytes: 6 %
Lymphocytes Relative: 15 %
Lymphs Abs: 1.1 10*3/uL (ref 0.7–4.0)
MCH: 32.8 pg (ref 26.0–34.0)
MCHC: 35.7 g/dL (ref 30.0–36.0)
MCV: 92 fL (ref 80.0–100.0)
Monocytes Absolute: 1.9 10*3/uL — ABNORMAL HIGH (ref 0.1–1.0)
Monocytes Relative: 27 %
Neutro Abs: 3.4 10*3/uL (ref 1.7–7.7)
Neutrophils Relative %: 49 %
Platelets: 649 10*3/uL — ABNORMAL HIGH (ref 150–400)
RBC: 2.62 MIL/uL — ABNORMAL LOW (ref 3.87–5.11)
RDW: 17.6 % — ABNORMAL HIGH (ref 11.5–15.5)
Smear Review: NORMAL
WBC: 7.1 10*3/uL (ref 4.0–10.5)
nRBC: 0 % (ref 0.0–0.2)

## 2020-05-06 LAB — GLUCOSE, CAPILLARY
Glucose-Capillary: 133 mg/dL — ABNORMAL HIGH (ref 70–99)
Glucose-Capillary: 136 mg/dL — ABNORMAL HIGH (ref 70–99)
Glucose-Capillary: 142 mg/dL — ABNORMAL HIGH (ref 70–99)
Glucose-Capillary: 196 mg/dL — ABNORMAL HIGH (ref 70–99)

## 2020-05-06 LAB — BASIC METABOLIC PANEL
Anion gap: 10 (ref 5–15)
BUN: 31 mg/dL — ABNORMAL HIGH (ref 8–23)
CO2: 20 mmol/L — ABNORMAL LOW (ref 22–32)
Calcium: 8.9 mg/dL (ref 8.9–10.3)
Chloride: 98 mmol/L (ref 98–111)
Creatinine, Ser: 0.93 mg/dL (ref 0.44–1.00)
GFR calc Af Amer: >60 mL/min (ref >60)
GFR calc non Af Amer: 58 mL/min — ABNORMAL LOW (ref >60)
Glucose, Bld: 119 mg/dL — ABNORMAL HIGH (ref 70–99)
Potassium: 4.6 mmol/L (ref 3.5–5.1)
Sodium: 128 mmol/L — ABNORMAL LOW (ref 135–145)

## 2020-05-06 LAB — C-REACTIVE PROTEIN: CRP: 14.1 mg/dL — ABNORMAL HIGH (ref <1.0)

## 2020-05-06 LAB — FIBRIN DERIVATIVES D-DIMER (ARMC ONLY): Fibrin derivatives D-dimer (ARMC): 2078.48 ng/mL (FEU) — ABNORMAL HIGH (ref 0.00–499.00)

## 2020-05-06 MED ORDER — MIDODRINE HCL 5 MG PO TABS
5.0000 mg | ORAL_TABLET | Freq: Three times a day (TID) | ORAL | Status: DC
  Administered 2020-05-06 – 2020-05-08 (×5): 5 mg via ORAL
  Filled 2020-05-06 (×5): qty 1

## 2020-05-06 NOTE — Care Management Important Message (Signed)
Important Message  Patient Details  Name: Leah Small MRN: [id number] Date of Birth: August 23, 1941   Medicare Important Message Given:  Yes  Reviewed with son, Zameria Vogl.  Confirmed they do have copy of Medicare IM.    [name] 05/06/2020, 2:35 PM

## 2020-05-06 NOTE — Progress Notes (Signed)
PROGRESS NOTE    Leah Small   QIH:[id number]  DOB: 05-21-1941  PCP: Idelle Crouch, MD    DOA: 04/26/2020 LOS: 78   Brief Narrative   Leah Small is a 79 y.o. female with medical history significant for coronary artery disease status post CABG, status post stent angioplasty, history of hypertension, diabetes mellitus, dyslipidemia and seizure disorder who presented to the ED via EMS for evaluation of chest pain.    Her troponins were significantly elevated (peaked at 15,900).  She was admitted to the hospital for acute NSTEMI.  She also had severe anemia and thrombocytopenia with hemoglobin of 6.1 and platelet count of 35,000 respectively.  She was transfused with 2 units of packed red blood cells and H&H have remained stable.  She was on aspirin and Plavix prior to admission but these were held temporarily.  Both aspirin and Plavix have been resumed since anemia and thrombocytopenia have improved.  Hematologist was consulted, recommended bone marrow biopsy for further evaluation.    Cardiologist consulted for acute NSTEMI in setting of severe anemia and known history of coronary artery disease.  This is being medically managed, but heparin had to be deferred due to severe anemia and thrombocytopenia.  She was started on midodrine for hypotension.  Admission has been prolonged by hyponatremia, hypotension, and now positive Covid-19 test.  Monitoring for symptoms and having PT re-evaluate.       Assessment & Plan   Principal Problem:   Non-STEMI (non-ST elevated myocardial infarction) ([place]) Active Problems:   Diabetes mellitus with complication (HCC)   Essential hypertension   Complex partial seizure disorder (HCC)   Pressure injury of skin   Chest pain due to CAD (HCC)   Anemia   Severe thrombocytopenia (HCC)   Pancytopenia (HCC)   Hypotension - unclear etiology, BP is quite labile.  Running higher today, have had to hold midodrine.Leah Small   --On midodrine at home, was  increased to 10 mg TID.  Doses held yesterday and this AM due to normal/high BP's --Reduce dose, resume midodrine 5 mg PO TID WC, hold if SBP>120 --Continue holding ramipril and cardizem for now, resume when BP can tolerate.   --Maintain MAP>=65.   Covid-19 Positive, Incidental finding - during evaluation of hyponatremia / SIADH, CT chest was obtained which showed scatter ground glass nodular patches, with recommendation to check for Covid.  Covid-19 PCR returned positive on 9/11 (was negative on admission 9/3).  Patient has received both Covid vaccines.  She also had Covid earlier this year.   Patient has been asymptomatic of Covid-like symptoms. Patient and son consented to monoclonal antibody infusion, given 9/12. --monitor closely   Generalized Weakness - PT following.  Recommendation has been HHPT with supervision for mobility on discharge.  Pt continues to be quite weak.  Need to ensure enough help at home or if she is SNF appropriate at this point given admission has been prolonged.   Pancytopenia - POA, etiology to be determined. Thrombocytopenia - presented w platelet 35k >> 195k. Improved.  Did not require platelet transfusion. Anemia / History of Iron Deficiency - presented with Hbg 6.1, consistent with acute on chronic anemia. Pt has had intermittent drops in Hbg over past 6 months.  Follows with Dr. Janese Banks.  Iron deficiency was corrected outpatient.  FOBT neg. S/p 2 units pRBC's transfused 9/3. --Bone Marrow biopsy done 9/8 --Results to be discussed at outpatient follow up with Dr. Janese Banks --Trend H&H, transfuse if Hbg<7   Acute Non-STEMI /  Multi-vessel CAD s/p CABG - hs[place]roponin peaked at 15[address], abnormal ECG, known history of CAD not amenable to intervention.  CABG was in 1997.  Recent cardiac cath in 12/4268 complicated by acute respiratory distress requiring intubation with findings overall unchanged from previous cath in 2017, with LVEF 35%, with severe multivessel native disease with  occlusion proximal LAD,leftcircumflex, and RCA, with total occlusion of SVG to RCA, circumflex and diagonal, with patent LIMA to mid LAD. Chronic Angina    Chronic systolic CHF - Echo: EF 62-37%, moderate MR  --Cardiology consulted, signed off 9/8. --Follow up w Dr. Nehemiah Massed in 1 week after d/c --Continue ASA/Plavix, Toprol-XL, Lipitor --Ramipril and Cardizem on hold due to hypotension --Continue Ranexa for angina   Hyponatremia - Na 126>>127>>127>>128 this AM.  Has been stable in upper 120's.   Possibly chronic / intermittent, but was normal in May this year.   Appears euvolemic to dry/hypovolemic.  Serum osm isotonic, urine concentrated with high urine sodium.  Suspect SIADH. TSH, cortisol and uric acid normal.   9/11 - obtained CT scan given SIADH picture, and prior CT from 2019 with pulmonary nodules.  It showed some ground glass nodules that are nonspecific, likely inflammatory vs infectious.  Subsequently test + for Covid as above.  --Daily BMP to monitor.   --Continue fluid restriction.   Type 2 Diabetes - on sliding scale Novolog.  Continue PRN gabapentin.  Add Levemir 8 units daily.   Essential Hypertension - chronic.  Currently with intermittent hypotension.  Ramipril, Cardizem on hold.  Maintain MAP>65.  Continue midodrine.   Complex partial seizure disorder - continue Lamictal, Keppra   Pulmonary nodules - seen on CT in 2019.  CT chest this admission as well (see report below).   Will require surveillance in follow up.   Sacral pressure injury - present on admission.  WOC consulted.  Reposition patient every 2 hours.   Recommend wound care clinic to follow.  Pressure Injury 12/28/19 Buttocks Left Stage 3 -  Full thickness tissue loss. Subcutaneous fat may be visible but bone, tendon or muscle are NOT exposed. (Active)  12/28/19 1800  Location: Buttocks  Location Orientation: Left  Staging: Stage 3 -  Full thickness tissue loss. Subcutaneous fat may be visible but  bone, tendon or muscle are NOT exposed.  Wound Description (Comments):   Present on Admission: Yes     Pressure Injury 05/03/20 Sacrum Mid Stage 2 -  Partial thickness loss of dermis presenting as a shallow open injury with a red, pink wound bed without slough. (Active)  05/03/20 0830  Location: Sacrum  Location Orientation: Mid  Staging: Stage 2 -  Partial thickness loss of dermis presenting as a shallow open injury with a red, pink wound bed without slough.  Wound Description (Comments):   Present on Admission:       DVT prophylaxis: enoxaparin (LOVENOX) injection 40 mg Start: 05/02/20 2200   Diet:  Diet Orders (From admission, onward)    Start     Ordered   05/03/20 1816  Diet Carb Modified Fluid consistency: Thin; Room service appropriate? Yes; Fluid restriction: 1200 mL Fluid  Diet effective now       Question Answer Comment  Diet-HS Snack? Nothing   Calorie Level Medium 1600-2000   Fluid consistency: Thin   Room service appropriate? Yes   Fluid restriction: 1200 mL Fluid      05/03/20 1818            Code Status: DNR  Subjective 05/06/20    Patient seen at bedside today.  She reports being tired and overall weak.  Denies fever/chills, chest pain, shortness of breath, abdominal pain, nausea or vomiting.  No problems since infusion yesterday.  She appears more tired today.     Disposition Plan & Communication   Status is: Inpatient  Remains Inpatient appropriate because of severity of illness, profound weakness needing PT re-evaluation to ensure safety of d/c plan home, hyponatremia.  Dispo: The patient is from: home              Anticipated d/c is to: home with HHPT and supervised OOB/mobility              Anticipated d/c date is: 1-2 days              Patient currently is NOT medically stable for d/c.   Family Communication: None at bedside today.  Son updated at bedside during encounter 9/8.   Consults, Procedures, Significant Events   Consultants:    Hematology  Cardiology  Radiology  Procedures:   Bone marrow biopsy 05/01/20  Antimicrobials:   None    Objective   Vitals:   05/05/20 2030 05/06/20 0600 05/06/20 0829 05/06/20 1140  BP: 119/60 134/60 137/67 (!) 98/46  Pulse: 66 65 70 (!) 54  Resp: _0 Temp: 98 F (36.7 C) 99.4 F (37.4 C) 97.9 F (36.6 C) 98.5 F (36.9 C)  TempSrc: Oral Oral Oral Oral  SpO2: 96% 95% 100% 97%  Weight:      Height:       No intake or output data in the 24 hours ending 05/06/20 1513 Filed Weights   05/03/20 0432 05/04/20 0343 05/05/20 0446  Weight: 55.5 kg 55.4 kg 57.3 kg    Physical Exam:  General exam: awake, alert, no acute distress, frail, pleasant Respiratory system: left basilar crackles, no wheezes or rhonchi, normal respiratory effort. Cardiovascular system: normal S1/S2, RRR, pedal edema.   Central nervous system: A&O x3. no gross focal neurologic deficits, normal speech Extremities: moves all, no cyanosis, normal tone Psychiatry: normal mood, congruent affect  Labs   Data Reviewed: I have personally reviewed following labs and imaging studies  CBC: Recent Labs  Lab 04/30/20 1009 04/30/20 1009 05/01/20 0515 05/01/20 0515 05/02/20 0523 05/03/20 0548 05/04/20 0403 05/05/20 0425 05/06/20 0536  WBC 4.2   < > 5.1   < > 5.5 6.2 5.8 6.6 7.1  NEUTROABS 2.6  --  3.0  --   --   --   --   --  3.4  HGB 10.1*   < > 9.7*   < > 9.2* 9.2* 8.3* 8.5* 8.6*  HCT 28.7*   < > 27.7*   < > 26.7* 26.5* 23.4* 23.2* 24.1*  MCV 94.4   < > 95.5   < > 96.0 96.4 92.1 92.4 92.0  PLT 130*   < > 195   < > 284 411* 455* 541* 649*   < > = values in this interval not displayed.   Basic Metabolic Panel: Recent Labs  Lab 05/02/20 0523 05/02/20 0523 05/03/20 0548 05/03/20 1558 05/04/20 0403 05/05/20 0425 05/06/20 0536  NA 127*   < > 127* 126* 127* 127* 128*  K 4.2  --  4.4  --  4.4 4.5 4.6  CL 98  --  99  --  99 99 98  CO2 19*  --  18*  --  19* 21* 20*  GLUCOSE 186*  --  258*  --  155* 167* 119*  BUN 34*  --  32*  --  33* 37* 31*  CREATININE 0.98  --  0.82  --  1.07* 1.07* 0.93  CALCIUM 8.7*  --  8.7*  --  8.7* 8.9 8.9  MG 1.8  --  1.7  --  2.2  --   --    < > = values in this interval not displayed.   GFR: Estimated Creatinine Clearance: 43.6 mL/min (by C-G formula based on SCr of 0.93 mg/dL). Liver Function Tests: No results for input(s): AST, ALT, ALKPHOS, BILITOT, PROT, ALBUMIN in the last 168 hours. No results for input(s): LIPASE, AMYLASE in the last 168 hours. No results for input(s): AMMONIA in the last 168 hours. Coagulation Profile: Recent Labs  Lab 04/30/20 0602  INR 1.3*   Cardiac Enzymes: No results for input(s): CKTOTAL, CKMB, CKMBINDEX, TROPONINI in the last 168 hours. BNP (last 3 results) No results for input(s): PROBNP in the last 8760 hours. HbA1C: No results for input(s): HGBA1C in the last 72 hours. CBG: Recent Labs  Lab 05/05/20 1216 05/05/20 1636 05/05/20 2028 05/06/20 0826 05/06/20 1138  GLUCAP 147* 108* 169* 133* 136*   Lipid Profile: No results for input(s): CHOL, HDL, LDLCALC, TRIG, CHOLHDL, LDLDIRECT in the last 72 hours. Thyroid Function Tests: No results for input(s): TSH, T4TOTAL, FREET4, T3FREE, THYROIDAB in the last 72 hours. Anemia Panel: No results for input(s): VITAMINB12, FOLATE, FERRITIN, TIBC, IRON, RETICCTPCT in the last 72 hours. Sepsis Labs: Recent Labs  Lab 05/04/20 0403  PROCALCITON 0.14    Recent Results (from the past 240 hour(s))  SARS Coronavirus 2 by RT PCR (hospital order, performed in [facility] hospital lab) Nasopharyngeal Nasopharyngeal Swab     Status: Abnormal   Collection Time: 05/04/20  5:17 PM   Specimen: Nasopharyngeal Swab  Result Value Ref Range Status   SARS Coronavirus 2 POSITIVE (A) NEGATIVE Final    Comment: RESULT CALLED TO, READ BACK BY AND VERIFIED WITH: DIANE ZAENGLE AT 1826 05/04/20.PMF (NOTE) SARS-CoV-2 target nucleic acids are DETECTED  SARS-CoV-2 RNA is  generally detectable in upper respiratory specimens  during the acute phase of infection.  Positive results are indicative  of the presence of the identified virus, but do not rule out bacterial infection or co-infection with other pathogens not detected by the test.  Clinical correlation with patient history and  other diagnostic information is necessary to determine patient infection status.  The expected result is negative.  Fact Sheet for Patients:   StrictlyIdeas.no   Fact Sheet for Healthcare Providers:   BankingDealers.co.za    This test is not yet approved or cleared by the [place] FDA and  has been authorized for detection and/or diagnosis of SARS-CoV-2 by FDA under an Emergency Use Authorization (EUA).  This EUA will remain in effect (meaning this tes t can be used) for the duration of  the COVID-19 declaration under Section 564(b)(1) of the Act, 21 U.S.C. section 360-bbb-3(b)(1), unless the authorization is terminated or revoked sooner.  Performed at [facility], [address], [place], [place] [address]       Imaging Studies   No results found.   Medications   Scheduled Meds: . aspirin EC  81 mg Oral Daily  . atorvastatin  40 mg Oral Daily  . brinzolamide  1 drop Both Eyes BID   And  . brimonidine  1 drop Both Eyes BID  . clopidogrel  75 mg Oral Daily  .  collagenase   Topical Daily  . docusate sodium  200 mg Oral BID  . enoxaparin (LOVENOX) injection  40 mg Subcutaneous Q24H  . folic acid  1 mg Oral Daily  . insulin aspart  0-15 Units Subcutaneous TID WC  . insulin detemir  8 Units Subcutaneous Daily  . lamoTRIgine  100 mg Oral Daily  . levETIRAcetam  500 mg Oral BID  . mouth rinse  15 mL Mouth Rinse BID  . metoprolol succinate  25 mg Oral Daily  . midodrine  5 mg Oral TID WC  . ranolazine  1,000 mg Oral BID  . sodium chloride flush  3 mL Intravenous Q12H  . timolol  1 drop Both Eyes BID   . vitamin B-12  500 mcg Oral BH-q7a  . vitamin E  400 Units Oral BH-q7a   Continuous Infusions: . sodium chloride    . sodium chloride    . famotidine (PEPCID) IV         LOS: 10 days    Time spent: 25 minutes with > 50% spent in coordination of care and direct patient contact.    Ezekiel Slocumb, DO [facility]  05/06/2020, 3:13 PM    If 7PM-7AM, please contact night-coverage. How to contact the [facility] Attending or Consulting provider [place] or covering provider during after hours [place], for this patient?    1. Check the care team in [facility] and look for a) attending/consulting [facility] provider listed and b) the [facility] team listed 2. Log into [email or web address] and use [place]'s universal password to access. If you do not have the password, please contact the hospital operator. 3. Locate the [facility] provider you are looking for under [facility] and page to a number that you can be directly reached. 4. If you still have difficulty reaching the provider, please page the [facility] (Director on Call) for the Hospitalists listed on amion for assistance.

## 2020-05-07 LAB — GLUCOSE, CAPILLARY
Glucose-Capillary: 128 mg/dL — ABNORMAL HIGH (ref 70–99)
Glucose-Capillary: 186 mg/dL — ABNORMAL HIGH (ref 70–99)
Glucose-Capillary: 218 mg/dL — ABNORMAL HIGH (ref 70–99)
Glucose-Capillary: 196 mg/dL — ABNORMAL HIGH (ref 70–99)

## 2020-05-07 LAB — BASIC METABOLIC PANEL
Anion gap: 9 (ref 5–15)
BUN: 30 mg/dL — ABNORMAL HIGH (ref 8–23)
CO2: 21 mmol/L — ABNORMAL LOW (ref 22–32)
Calcium: 8.6 mg/dL — ABNORMAL LOW (ref 8.9–10.3)
Chloride: 98 mmol/L (ref 98–111)
Creatinine, Ser: 0.82 mg/dL (ref 0.44–1.00)
GFR calc Af Amer: >60 mL/min (ref >60)
GFR calc non Af Amer: >60 mL/min (ref >60)
Glucose, Bld: 125 mg/dL — ABNORMAL HIGH (ref 70–99)
Potassium: 4.7 mmol/L (ref 3.5–5.1)
Sodium: 128 mmol/L — ABNORMAL LOW (ref 135–145)

## 2020-05-07 LAB — CBC
HCT: 22.5 % — ABNORMAL LOW (ref 36.0–46.0)
Hemoglobin: 7.8 g/dL — ABNORMAL LOW (ref 12.0–15.0)
MCH: 33.6 pg (ref 26.0–34.0)
MCHC: 34.7 g/dL (ref 30.0–36.0)
MCV: 97 fL (ref 80.0–100.0)
Platelets: 542 10*3/uL — ABNORMAL HIGH (ref 150–400)
RBC: 2.32 MIL/uL — ABNORMAL LOW (ref 3.87–5.11)
RDW: 17.9 % — ABNORMAL HIGH (ref 11.5–15.5)
WBC: 6.6 10*3/uL (ref 4.0–10.5)
nRBC: 0 % (ref 0.0–0.2)

## 2020-05-07 LAB — PREPARE RBC (CROSSMATCH)

## 2020-05-07 LAB — C-REACTIVE PROTEIN: CRP: 11.2 mg/dL — ABNORMAL HIGH (ref <1.0)

## 2020-05-07 LAB — MAGNESIUM: Magnesium: 2.1 mg/dL (ref 1.7–2.4)

## 2020-05-07 LAB — HEMOGLOBIN AND HEMATOCRIT, BLOOD
HCT: 30.7 % — ABNORMAL LOW (ref 36.0–46.0)
Hemoglobin: 10.5 g/dL — ABNORMAL LOW (ref 12.0–15.0)

## 2020-05-07 MED ORDER — SODIUM CHLORIDE 0.9% IV SOLUTION: Freq: Once | INTRAVENOUS | Status: AC

## 2020-05-07 NOTE — Progress Notes (Signed)
Patient has orders to be transferred to 1C. Patient notified and she is aware of the change in rooms.

## 2020-05-07 NOTE — Progress Notes (Signed)
Patient arrived to unit in stable condition from 2A. Pt oriented to room and unit. Denies any unmet needs at present time.

## 2020-05-07 NOTE — Progress Notes (Signed)
Physical Therapy Treatment Patient Details Name: Leah Small MRN: [id number] DOB: Apr 30, 1941 Today's Date: 05/07/2020    History of Present Illness 79 y.o. female with medical history significant for coronary artery disease status post CABG, status post stent angioplasty, history of hypertension, diabetes mellitus, dyslipidemia and seizure disorder who presented to the ED via EMS for evaluation of chest pain.  Patient states that she has frequent anginal episodes for which she takes nitroglycerin but this morning she developed chest pain at rest which she rated 10 x 10 in intensity at its worst.  Pain radiated to her left jaw and left arm and was associated with nausea and vomiting.  Admitted with NSTEMI.    PT Comments    Pt resting in bed upon PT arrival; agreeable to PT session.  Modified independent with bed mobility; CGA with transfers; and CGA with ambulating 35 feet with RW.  Limited distance ambulating d/t SOB and fatigue (pt requiring 1 standing rest break during ambulation d/t SOB).  Pt requiring rest breaks between activities and ex's d/t SOB and fatigue (O2 sats 95-98% on room air and HR 61-72 bpm during sessions activities).  Although pt does not require significant physical assist with functional mobility, pt is overall limited d/t SOB, generalized weakness, and impaired activity tolerance.  D/t noted impairments, PT recommendations updated to STR (if pt wants to discharge home instead, recommendation 24/7 assist).    Follow Up Recommendations  SNF     Equipment Recommendations  Rolling walker with 5" wheels;3in1 (PT)    Recommendations for Other Services       Precautions / Restrictions Precautions Precautions: Fall Precaution Comments: Seizure precautions; elevate heels off bed; pressure redistribution chair pad Restrictions Weight Bearing Restrictions: No    Mobility  Bed Mobility Overal bed mobility: Modified Independent             General bed mobility  comments: Semi-supine to/from sitting with mild increased effort/time to perform on own  Transfers Overall transfer level: Needs assistance Equipment used: Rolling walker (2 wheeled) Transfers: Sit to/from Stand Sit to Stand: Min guard         General transfer comment: increased effort to stand up from bed on own (up to RW)  Ambulation/Gait Ambulation/Gait assistance: Min guard Gait Distance (Feet): 35 Feet Assistive device: Rolling walker (2 wheeled)   Gait velocity: decreased   General Gait Details: partial step through gait pattern; 1 standing rest break d/t SOB; limited distance d/t fatigue and SOB; slow gait speed   Stairs             Wheelchair Mobility    Modified Rankin (Stroke Patients Only)       Balance Overall balance assessment: Needs assistance Sitting-balance support: No upper extremity supported;Feet supported Sitting balance-Leahy Scale: Good Sitting balance - Comments: steady sitting reaching within BOS   Standing balance support: Single extremity supported Standing balance-Leahy Scale: Poor Standing balance comment: pt requiring at least single UE support for static standing balance                            Cognition Arousal/Alertness: Awake/alert Behavior During Therapy: WFL for tasks assessed/performed Overall Cognitive Status: Within Functional Limits for tasks assessed                                        Exercises General Exercises -  Lower Extremity Long Arc Quad: AROM;Strengthening;Both;10 reps;Seated Hip Flexion/Marching: AROM;Strengthening;Both;10 reps;Seated    General Comments   Nursing cleared pt for participation in physical therapy.  Pt agreeable to PT session.      Pertinent Vitals/Pain Pain Assessment: No/denies pain     Home Living                      Prior Function            PT Goals (current goals can now be found in the care plan section) Acute Rehab PT  Goals Patient Stated Goal: go home PT Goal Formulation: With patient Time For Goal Achievement: 05/14/20 Potential to Achieve Goals: Good Progress towards PT goals: Progressing toward goals    Frequency    Min 2X/week      PT Plan Discharge plan needs to be updated    Co-evaluation              AM-PAC PT "6 Clicks" Mobility   Outcome Measure  Help needed turning from your back to your side while in a flat bed without using bedrails?: None Help needed moving from lying on your back to sitting on the side of a flat bed without using bedrails?: None Help needed moving to and from a bed to a chair (including a wheelchair)?: A Little Help needed standing up from a chair using your arms (e.g., wheelchair or bedside chair)?: A Little Help needed to walk in hospital room?: A Little Help needed climbing 3-5 steps with a railing? : A Little 6 Click Score: 20    End of Session Equipment Utilized During Treatment: Gait belt Activity Tolerance: Patient limited by fatigue Patient left: in bed;with call bell/phone within reach;with bed alarm set;with nursing/sitter in room Nurse Communication: Mobility status;Precautions PT Visit Diagnosis: Muscle weakness (generalized) (M62.81);Difficulty in walking, not elsewhere classified (R26.2)     Time: 3664-4034 PT Time Calculation (min) (ACUTE ONLY): 24 min  Charges:  $Therapeutic Exercise: 8-22 mins $Therapeutic Activity: 8-22 mins                    Hendricks Limes, PT 05/07/20, 12:12 PM

## 2020-05-07 NOTE — Progress Notes (Addendum)
PROGRESS NOTE    Leah Small   ZJQ:[id number]  DOB: 09-May-1941  PCP: Idelle Crouch, MD    DOA: 04/26/2020 LOS: 11   Brief Narrative   Leah Small is a 79 y.o. female with medical history significant for coronary artery disease status post CABG, status post stent angioplasty, history of hypertension, diabetes mellitus, dyslipidemia and seizure disorder who presented to the ED via EMS for evaluation of chest pain.    Her troponins were significantly elevated (peaked at 15,900).  She was admitted to the hospital for acute NSTEMI.  She also had severe anemia and thrombocytopenia with hemoglobin of 6.1 and platelet count of 35,000 respectively.  She was transfused with 2 units of packed red blood cells and H&H have remained stable.  She was on aspirin and Plavix prior to admission but these were held temporarily.  Both aspirin and Plavix have been resumed since anemia and thrombocytopenia have improved.  Hematologist was consulted, recommended bone marrow biopsy for further evaluation.    Cardiologist consulted for acute NSTEMI in setting of severe anemia and known history of coronary artery disease.  This is being medically managed, but heparin had to be deferred due to severe anemia and thrombocytopenia.  She was started on midodrine for hypotension.  Admission has been prolonged by hyponatremia, hypotension, and now positive Covid-19 test.  Received monoclonal antibodies given asymptomatic and incidental positive.  PT recommends SNF vs 24/7 assistance at home.    Patient will have close follow up with Dr. Janese Small.  She recommends transfusions for Hbg<8.     Assessment & Plan   Principal Problem:   Non-STEMI (non-ST elevated myocardial infarction) ([place]) Active Problems:   Diabetes mellitus with complication (HCC)   Essential hypertension   Complex partial seizure disorder (HCC)   Pressure injury of skin   Chest pain due to CAD (HCC)   Anemia   Severe thrombocytopenia (HCC)    Pancytopenia (HCC)   Hypotension - unclear etiology, BP is labile.  Running higher past couple days, have placed hold parameters on midodrine.   --Continue midodrine 5 mg PO TID WC, hold if SBP>120 --Continue holding ramipril and cardizem for now, resume when BP can tolerate.   --Maintain MAP>=65.   Covid-19 Positive, Incidental finding - during evaluation of hyponatremia / SIADH, CT chest was obtained which showed scatter ground glass nodular patches, with recommendation to check for Covid.  Covid-19 PCR returned positive on 9/11 (was negative on admission 9/3).  Patient has received both Covid vaccines.  She also had Covid earlier this year.   Patient has been asymptomatic of Covid-like symptoms. Patient and son consented to monoclonal antibody infusion, given 9/12. --monitor closely --to remain on isolation x 10 days (through 9/21)   Generalized Weakness - PT following.  Recommendation has been HHPT with supervision for mobility on discharge.  Pt continues to be quite weak.  Need to ensure enough help at home or if she is SNF appropriate at this point given admission has been prolonged.   Pancytopenia - POA, etiology to be determined.  Improved Thrombocytopenia - presented w platelet 35k >> 195k. Improved.  Did not require platelet transfusion. Thrombocytosis - not POA, reactive.  Trending down today.  Anemia / History of Iron Deficiency - presented with Hbg 6.1, consistent with acute on chronic anemia.  Pt has had intermittent drops in Hbg over past 6 months.   Follows with Dr. Janese Small.  Iron deficiency was corrected outpatient.  FOBT neg. S/p 2 units  pRBC's transfused 9/3. --Bone Marrow biopsy done 9/8 --Results to be discussed at outpatient follow up with Dr. Janese Small --Trend H&H, transfuse if Hbg<8 --will transfuse 1 unit pRBC's today   Acute Non-STEMI / Multi-vessel CAD s/p CABG - hs[place]roponin [place] at 15[address], abnormal ECG, known history of CAD not amenable to intervention.  CABG was in  1997.  Recent cardiac cath in 11/5857 complicated by acute respiratory distress requiring intubation with findings overall unchanged from previous cath in 2017, with LVEF 35%, with severe multivessel native disease with occlusion proximal LAD,leftcircumflex, and RCA, with total occlusion of SVG to RCA, circumflex and diagonal, with patent LIMA to mid LAD. Chronic Angina    Chronic systolic CHF - Echo: EF 29-24%, moderate MR  --Cardiology consulted, signed off 9/8. --Follow up w Dr. Nehemiah Small in 1 week after d/c --Continue ASA/Plavix, Toprol-XL, Lipitor --Ramipril and Cardizem on hold due to hypotension --Continue Ranexa for angina   Hyponatremia - Na 126>>127>>127>>128>>128 this AM.  Has been stable in upper 120's.   Possibly chronic / intermittent, but was normal in May this year.   Appears euvolemic to dry/hypovolemic.  Serum osm isotonic, urine concentrated with high urine sodium.  Suspect SIADH. TSH, cortisol and uric acid normal.   9/11 - obtained CT scan given SIADH picture, and prior CT from 2019 with pulmonary nodules.  It showed some ground glass nodules that are nonspecific, likely inflammatory vs infectious.  Subsequently test + for Covid as above.  --Daily BMP to monitor.   --Continue fluid restriction.   Type 2 Diabetes - on sliding scale Novolog.  Continue PRN gabapentin.  Added Levemir 8 units daily.   Essential Hypertension - chronic.  Currently with intermittent hypotension.  Ramipril, Cardizem on hold.  Maintain MAP>65.  Continue midodrine.   Complex partial seizure disorder - continue Lamictal, Keppra   Pulmonary nodules - seen on CT in 2019.  CT chest this admission as well (see report below).   Will require surveillance in follow up.   Sacral pressure injury - present on admission.  WOC consulted.  Reposition patient every 2 hours.   Recommend wound care clinic to follow.  Pressure Injury 12/28/19 Buttocks Left Stage 3 -  Full thickness tissue loss.  Subcutaneous fat may be visible but bone, tendon or muscle are NOT exposed. (Active)  12/28/19 1800  Location: Buttocks  Location Orientation: Left  Staging: Stage 3 -  Full thickness tissue loss. Subcutaneous fat may be visible but bone, tendon or muscle are NOT exposed.  Wound Description (Comments):   Present on Admission: Yes     Pressure Injury 05/03/20 Sacrum Mid Stage 2 -  Partial thickness loss of dermis presenting as a shallow open injury with a red, pink wound bed without slough. (Active)  05/03/20 0830  Location: Sacrum  Location Orientation: Mid  Staging: Stage 2 -  Partial thickness loss of dermis presenting as a shallow open injury with a red, pink wound bed without slough.  Wound Description (Comments):   Present on Admission:       DVT prophylaxis: enoxaparin (LOVENOX) injection 40 mg Start: 05/02/20 2200   Diet:  Diet Orders (From admission, onward)    Start     Ordered   05/03/20 1816  Diet Carb Modified Fluid consistency: Thin; Room service appropriate? Yes; Fluid restriction: 1200 mL Fluid  Diet effective now       Question Answer Comment  Diet-HS Snack? Nothing   Calorie Level Medium 1600-2000   Fluid consistency: Thin  Room service appropriate? Yes   Fluid restriction: 1200 mL Fluid      05/03/20 1818            Code Status: DNR    Subjective 05/07/20    Patient seen at bedside today.  Reports she feels well today.  No fever/chills, SOB, cough, n/v/d or other complaints.  She says she prefers to go home instead of rehab.     Disposition Plan & Communication   Status is: Inpatient  Remains Inpatient appropriate because of severity of illness, profound weakness needing PT re-evaluation to ensure safety of d/c plan home, hyponatremia.  Dispo: The patient is from: home              Anticipated d/c is to: home with HHPT and supervised OOB/mobility              Anticipated d/c date is: 1-2 days              Patient currently is NOT medically  stable for d/c.   Family Communication: Son Leah Small updated by phone this afternoon, 9/14.  He and family to discuss with patient SNF vs home with [facility] and 24/7 assistance.   Consults, Procedures, Significant Events   Consultants:   Hematology  Cardiology  Radiology  Procedures:   Bone marrow biopsy 05/01/20  Antimicrobials:   None    Objective   Vitals:   05/07/20 0559 05/07/20 0735 05/07/20 1253 05/07/20 1423  BP: 101/60 127/73 (!) 102/51 (!) 137/48  Pulse: (!) 59 (!) 59 (!) 59 63  Resp: $Remo'18 19 19 20  'VRiHp$ Temp: 98.8 F (37.1 C) 98 F (36.7 C) 98.1 F (36.7 C) 98 F (36.7 C)  TempSrc:   Oral Oral  SpO2: 97% 95% 98% 98%  Weight: 56.1 kg     Height:        Intake/Output Summary (Last 24 hours) at 05/07/2020 1446 Last data filed at 05/07/2020 1101 Gross per 24 hour  Intake --  Output 1300 ml  Net -1300 ml   Filed Weights   05/04/20 0343 05/05/20 0446 05/07/20 0559  Weight: 55.4 kg 57.3 kg 56.1 kg    Physical Exam:  General exam: awake, alert, no acute distress, frail, pleasant Respiratory system: CTAB, no wheezes or rhonchi, normal respiratory effort. Cardiovascular system: normal S1/S2, RRR, pedal edema.   Central nervous system: A&O x3. no gross focal neurologic deficits, normal speech Extremities: moves all, no cyanosis, normal tone Psychiatry: normal mood, congruent affect  Labs   Data Reviewed: I have personally reviewed following labs and imaging studies  CBC: Recent Labs  Lab 05/01/20 0515 05/02/20 0523 05/03/20 0548 05/04/20 0403 05/05/20 0425 05/06/20 0536 05/07/20 0656  WBC 5.1   < > 6.2 5.8 6.6 7.1 6.6  NEUTROABS 3.0  --   --   --   --  3.4  --   HGB 9.7*   < > 9.2* 8.3* 8.5* 8.6* 7.8*  HCT 27.7*   < > 26.5* 23.4* 23.2* 24.1* 22.5*  MCV 95.5   < > 96.4 92.1 92.4 92.0 97.0  PLT 195   < > 411* 455* 541* 649* 542*   < > = values in this interval not displayed.   Basic Metabolic Panel: Recent Labs  Lab 05/02/20 0523 05/02/20 0523  05/03/20 0548 05/03/20 0548 05/03/20 1558 05/04/20 0403 05/05/20 0425 05/06/20 0536 05/07/20 0656  NA 127*   < > 127*   < > 126* 127* 127* 128* 128*  K 4.2   < >  4.4  --   --  4.4 4.5 4.6 4.7  CL 98   < > 99  --   --  99 99 98 98  CO2 19*   < > 18*  --   --  19* 21* 20* 21*  GLUCOSE 186*   < > 258*  --   --  155* 167* 119* 125*  BUN 34*   < > 32*  --   --  33* 37* 31* 30*  CREATININE 0.98   < > 0.82  --   --  1.07* 1.07* 0.93 0.82  CALCIUM 8.7*   < > 8.7*  --   --  8.7* 8.9 8.9 8.6*  MG 1.8  --  1.7  --   --  2.2  --   --  2.1   < > = values in this interval not displayed.   GFR: Estimated Creatinine Clearance: 48.5 mL/min (by C-G formula based on SCr of 0.82 mg/dL). Liver Function Tests: No results for input(s): AST, ALT, ALKPHOS, BILITOT, PROT, ALBUMIN in the last 168 hours. No results for input(s): LIPASE, AMYLASE in the last 168 hours. No results for input(s): AMMONIA in the last 168 hours. Coagulation Profile: No results for input(s): INR, PROTIME in the last 168 hours. Cardiac Enzymes: No results for input(s): CKTOTAL, CKMB, CKMBINDEX, TROPONINI in the last 168 hours. BNP (last 3 results) No results for input(s): PROBNP in the last 8760 hours. HbA1C: No results for input(s): HGBA1C in the last 72 hours. CBG: Recent Labs  Lab 05/06/20 1138 05/06/20 1702 05/06/20 2104 05/07/20 0737 05/07/20 1253  GLUCAP 136* 142* 196* 128* 186*   Lipid Profile: No results for input(s): CHOL, HDL, LDLCALC, TRIG, CHOLHDL, LDLDIRECT in the last 72 hours. Thyroid Function Tests: No results for input(s): TSH, T4TOTAL, FREET4, T3FREE, THYROIDAB in the last 72 hours. Anemia Panel: No results for input(s): VITAMINB12, FOLATE, FERRITIN, TIBC, IRON, RETICCTPCT in the last 72 hours. Sepsis Labs: Recent Labs  Lab 05/04/20 0403  PROCALCITON 0.14    Recent Results (from the past 240 hour(s))  SARS Coronavirus 2 by RT PCR (hospital order, performed in [facility] hospital lab)  Nasopharyngeal Nasopharyngeal Swab     Status: Abnormal   Collection Time: 05/04/20  5:17 PM   Specimen: Nasopharyngeal Swab  Result Value Ref Range Status   SARS Coronavirus 2 POSITIVE (A) NEGATIVE Final    Comment: RESULT CALLED TO, READ BACK BY AND VERIFIED WITH: DIANE ZAENGLE AT 1826 05/04/20.PMF (NOTE) SARS-CoV-2 target nucleic acids are DETECTED  SARS-CoV-2 RNA is generally detectable in upper respiratory specimens  during the acute phase of infection.  Positive results are indicative  of the presence of the identified virus, but do not rule out bacterial infection or co-infection with other pathogens not detected by the test.  Clinical correlation with patient history and  other diagnostic information is necessary to determine patient infection status.  The expected result is negative.  Fact Sheet for Patients:   StrictlyIdeas.no   Fact Sheet for Healthcare Providers:   BankingDealers.co.za    This test is not yet approved or cleared by the [place] FDA and  has been authorized for detection and/or diagnosis of SARS-CoV-2 by FDA under an Emergency Use Authorization (EUA).  This EUA will remain in effect (meaning this tes t can be used) for the duration of  the COVID-19 declaration under Section 564(b)(1) of the Act, 21 U.S.C. section 360-bbb-3(b)(1), unless the authorization is terminated or revoked sooner.  Performed  at [facility], [address], [place], [place] [address]       Imaging Studies   No results found.   Medications   Scheduled Meds: . aspirin EC  81 mg Oral Daily  . atorvastatin  40 mg Oral Daily  . brinzolamide  1 drop Both Eyes BID   And  . brimonidine  1 drop Both Eyes BID  . clopidogrel  75 mg Oral Daily  . collagenase   Topical Daily  . docusate sodium  200 mg Oral BID  . enoxaparin (LOVENOX) injection  40 mg Subcutaneous Q24H  . folic acid  1 mg Oral Daily  . insulin  aspart  0-15 Units Subcutaneous TID WC  . insulin detemir  8 Units Subcutaneous Daily  . lamoTRIgine  100 mg Oral Daily  . levETIRAcetam  500 mg Oral BID  . mouth rinse  15 mL Mouth Rinse BID  . metoprolol succinate  25 mg Oral Daily  . midodrine  5 mg Oral TID WC  . ranolazine  1,000 mg Oral BID  . sodium chloride flush  3 mL Intravenous Q12H  . timolol  1 drop Both Eyes BID  . vitamin B-12  500 mcg Oral BH-q7a  . vitamin E  400 Units Oral BH-q7a   Continuous Infusions: . sodium chloride    . sodium chloride    . famotidine (PEPCID) IV         LOS: 11 days    Time spent: 25 minutes with > 50% spent in coordination of care and direct patient contact.    Ezekiel Slocumb, DO [facility]  05/07/2020, 2:46 PM    If 7PM-7AM, please contact night-coverage. How to contact the [facility] Attending or Consulting provider [place] or covering provider during after hours [place], for this patient?    1. Check the care team in [facility] and look for a) attending/consulting TR[facility] provider listed and b) the [facility] team listed 2. Log into [email or web address] and use 's universal password to access. If you do not have the password, please contact the hospital operator. 3. Locate the [facility] provider you are looking for under [facility] and page to a number that you can be directly reached. 4. If you still have difficulty reaching the provider, please page the [facility] (Director on Call) for the Hospitalists listed on amion for assistance.

## 2020-05-07 NOTE — Progress Notes (Addendum)
Held patient's 8 am scheduled midodrine per order parameters. BP 127/73  This patient tested positive for Covid on 05-04-2020 and moved to Negative Pressure room 240.

## 2020-05-07 NOTE — Telephone Encounter (Signed)
Spoke to son today

## 2020-05-07 NOTE — NC FL2 (Signed)
[place] MEDICAID FL2 LEVEL OF CARE SCREENING TOOL     IDENTIFICATION  Patient Name: Leah Small Birthdate: 1941-06-11 Sex: female Admission Date (Current Location): 04/26/2020  [place] and [place] Number:  Chiropodist and Address:  [facility], [address] [place], [place] [address]      Provider Number: [id number]  Attending Physician Name and Address:  Pennie Banter, DO  Relative Name and Phone Number:       Current Level of Care:   Recommended Level of Care: Skilled Nursing Facility Prior Approval Number:    Date Approved/Denied:   PASRR Number: [id number]  Discharge Plan: SNF    Current Diagnoses: Patient Active Problem List   Diagnosis Date Noted  . Chest pain due to CAD (HCC) 04/26/2020  . Anemia 04/26/2020  . Severe thrombocytopenia (HCC) 04/26/2020  . Pancytopenia (HCC)   . Iron deficiency anemia 01/25/2020  . History of ETT   . Acute respiratory failure with hypoxia (HCC)   . Palliative care by specialist   . Goals of care, counseling/discussion   . Pressure injury of skin 12/30/2019  . STEMI (ST elevation myocardial infarction) (HCC) 12/28/2019  . Malnutrition of moderate degree 08/12/2017  . Hypotension   . Carotid stenosis 11/12/2016  . Hypertensive heart disease 01/29/2016  . Normocytic anemia 01/29/2016  . History of vaginal bleeding 01/29/2016  . GERD (gastroesophageal reflux disease) 01/29/2016  . NSTEMI (non-ST elevated myocardial infarction) (HCC) 10/22/2015  . Non-STEMI (non-ST elevated myocardial infarction) (HCC) 10/22/2015  . 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 Coronary atherosclerosis of native coronary artery 10/22/2015  . Angina pectoris (HCC)   . Memory deficit   . Stroke (HCC) 07/06/2015  . Diabetes mellitus with complication (HCC) 07/06/2015  . Essential hypertension 07/06/2015  . Hyperlipidemia 07/06/2015  . Left-sided weakness     Orientation RESPIRATION  BLADDER Height & Weight     Self, Time, Situation, Place  Normal Continent Weight: 123 lb 11.2 oz (56.1 kg) Height:  5\' 6"  (167.6 cm)  BEHAVIORAL SYMPTOMS/MOOD NEUROLOGICAL BOWEL NUTRITION STATUS      Continent Diet (Carb Modified Fluid consistency: Thin; Fluid restriction: 1200 mL Fluid)  AMBULATORY STATUS COMMUNICATION OF NEEDS Skin   Limited Assist Verbally PU Stage and Appropriate Care, Other (Comment) (wound on lower left back)   PU Stage 2 Dressing:  (sacrum, foam dressing) PU Stage 3 Dressing:  (buttocks)                 Personal Care Assistance Level of Assistance  Dressing, Feeding, Bathing Bathing Assistance: Limited assistance Feeding assistance: Independent Dressing Assistance: Limited assistance     Functional Limitations Info  Sight, Hearing, Speech Sight Info: Adequate Hearing Info: Adequate Speech Info: Adequate    SPECIAL CARE FACTORS FREQUENCY  PT (By licensed PT), OT (By licensed OT)     PT Frequency: 5x OT Frequency: 5x            Contractures Contractures Info: Not present    Additional Factors Info  Code Status, Allergies Code Status Info: DNR Allergies Info: Adhesive (Tape), Talwin (Pentazocine), Valium (Diazepam), Vicodin (Hydrocodone-acetaminophen), Beta Adrenergic Blockers, Levofloxacin, Simvastatin           Current Medications (05/07/2020):  This is the current hospital active medication list Current Facility-Administered Medications  Medication Dose Route Frequency Provider Last Rate Last Admin  . 0.9 %  sodium chloride infusion  250 mL Intravenous PRN 05/09/2020, MD      .  0.9 %  sodium chloride infusion   Intravenous PRN Esaw Grandchild A, DO      . acetaminophen (TYLENOL) tablet 650 mg  650 mg Oral Q4H PRN Agbata, Tochukwu, MD   650 mg at 05/01/20 2235  . albuterol (VENTOLIN HFA) 108 (90 Base) MCG/ACT inhaler 2 puff  2 puff Inhalation Once PRN Esaw Grandchild A, DO      . aspirin EC tablet 81 mg  81 mg Oral Daily Lurene Shadow, MD   81 mg at 05/07/20 0924  . atorvastatin (LIPITOR) tablet 40 mg  40 mg Oral Daily Agbata, Tochukwu, MD   40 mg at 05/06/20 2212  . bisacodyl (DULCOLAX) EC tablet 5 mg  5 mg Oral Daily PRN Esaw Grandchild A, DO      . brinzolamide (AZOPT) 1 % ophthalmic suspension 1 drop  1 drop Both Eyes BID Lurene Shadow, MD   1 drop at 05/07/20 0932   And  . brimonidine (ALPHAGAN) 0.2 % ophthalmic solution 1 drop  1 drop Both Eyes BID Lurene Shadow, MD   1 drop at 05/07/20 0932  . clopidogrel (PLAVIX) tablet 75 mg  75 mg Oral Daily Lurene Shadow, MD   75 mg at 05/07/20 0924  . collagenase (SANTYL) ointment   Topical Daily Pennie Banter, DO   Given at 05/07/20 0932  . diphenhydrAMINE (BENADRYL) injection 50 mg  50 mg Intravenous Once PRN Esaw Grandchild A, DO      . docusate sodium (COLACE) capsule 200 mg  200 mg Oral BID Esaw Grandchild A, DO   200 mg at 05/07/20 0924  . enoxaparin (LOVENOX) injection 40 mg  40 mg Subcutaneous Q24H Esaw Grandchild A, DO   40 mg at 05/06/20 2212  . EPINEPHrine (EPI-PEN) injection 0.3 mg  0.3 mg Intramuscular Once PRN Pennie Banter, DO      . famotidine (PEPCID) IVPB 20 mg premix  20 mg Intravenous Once PRN Esaw Grandchild A, DO      . folic acid (FOLVITE) tablet 1 mg  1 mg Oral Daily Agbata, Tochukwu, MD   1 mg at 05/07/20 0924  . gabapentin (NEURONTIN) capsule 100 mg  100 mg Oral Daily PRN Agbata, Tochukwu, MD      . insulin aspart (novoLOG) injection 0-15 Units  0-15 Units Subcutaneous TID WC Agbata, Tochukwu, MD   2 Units at 05/07/20 0923  . insulin detemir (LEVEMIR) injection 8 Units  8 Units Subcutaneous Daily Esaw Grandchild A, DO   8 Units at 05/07/20 1950  . lamoTRIgine (LAMICTAL) tablet 100 mg  100 mg Oral Daily Agbata, Tochukwu, MD   100 mg at 05/06/20 2212  . levETIRAcetam (KEPPRA) tablet 500 mg  500 mg Oral BID Agbata, Tochukwu, MD   500 mg at 05/07/20 0925  . MEDLINE mouth rinse  15 mL Mouth Rinse BID Lurene Shadow, MD   15 mL at 05/07/20 0933   . methylPREDNISolone sodium succinate (SOLU-MEDROL) 125 mg/2 mL injection 125 mg  125 mg Intravenous Once PRN Esaw Grandchild A, DO      . metoprolol succinate (TOPROL-XL) 24 hr tablet 25 mg  25 mg Oral Daily Lurene Shadow, MD   25 mg at 05/06/20 0907  . midodrine (PROAMATINE) tablet 5 mg  5 mg Oral TID WC Esaw Grandchild A, DO   5 mg at 05/06/20 1748  . nitroGLYCERIN (NITROSTAT) SL tablet 0.4 mg  0.4 mg Sublingual Q5 Min x 3 PRN Agbata, Tochukwu, MD      . ondansetron (  ZOFRAN) injection 4 mg  4 mg Intravenous Q6H PRN Agbata, Tochukwu, MD   4 mg at 05/05/20 0239  . ranolazine (RANEXA) 12 hr tablet 1,000 mg  1,000 mg Oral BID Leanora Ivanoff, PA-C   1,000 mg at 05/07/20 8270  . sodium chloride flush (NS) 0.9 % injection 3 mL  3 mL Intravenous Q12H Agbata, Tochukwu, MD   3 mL at 05/07/20 0933  . sodium chloride flush (NS) 0.9 % injection 3 mL  3 mL Intravenous PRN Agbata, Tochukwu, MD   3 mL at 04/27/20 0950  . timolol (TIMOPTIC) 0.5 % ophthalmic solution 1 drop  1 drop Both Eyes BID Agbata, Tochukwu, MD   1 drop at 05/07/20 0933  . triamcinolone ointment (KENALOG) 0.1 % 1 application  1 application Topical TID PRN Agbata, Tochukwu, MD      . vitamin B-12 (CYANOCOBALAMIN) tablet 500 mcg  500 mcg Oral Candace Gallus, Tochukwu, MD   500 mcg at 05/07/20 0607  . vitamin E capsule 400 Units  400 Units Oral Thomas Hoff, MD   400 Units at 05/07/20 7867     Discharge Medications: Please see discharge summary for a list of discharge medications.  Relevant Imaging Results:  Relevant Lab Results:   Additional Information SSN:[id number]  Reuel Boom Zabdiel Dripps, LCSW

## 2020-05-07 NOTE — Progress Notes (Signed)
-   Updated Son mackenzye mackel on the results of BM biopsy. No leukemia. Changes not entirely consistent with MDS. Awaiting FISH and NGS results - anemia likely secondary to chronic disease now worse in the setting of acute illness/ covid. If hb <8, ok to transfuse 1 unit just prior to discharge until patient can safely follow up at cancer center - thrombocytosis. Reactive. No intervention at this time  Dr. Owens Shark, MD, MPH [facility] at [facility] [phone] 05/07/2020 12:39 PM

## 2020-05-08 ENCOUNTER — Encounter (HOSPITAL_COMMUNITY): Payer: Self-pay | Admitting: Oncology

## 2020-05-08 LAB — CBC
HCT: 28.5 % — ABNORMAL LOW (ref 36.0–46.0)
Hemoglobin: 10.3 g/dL — ABNORMAL LOW (ref 12.0–15.0)
MCH: 32.7 pg (ref 26.0–34.0)
MCHC: 36.1 g/dL — ABNORMAL HIGH (ref 30.0–36.0)
MCV: 90.5 fL (ref 80.0–100.0)
Platelets: 548 10*3/uL — ABNORMAL HIGH (ref 150–400)
RBC: 3.15 MIL/uL — ABNORMAL LOW (ref 3.87–5.11)
RDW: 19.2 % — ABNORMAL HIGH (ref 11.5–15.5)
WBC: 8.7 10*3/uL (ref 4.0–10.5)
nRBC: 0 % (ref 0.0–0.2)

## 2020-05-08 LAB — BASIC METABOLIC PANEL
Anion gap: 10 (ref 5–15)
BUN: 27 mg/dL — ABNORMAL HIGH (ref 8–23)
CO2: 22 mmol/L (ref 22–32)
Calcium: 8.8 mg/dL — ABNORMAL LOW (ref 8.9–10.3)
Chloride: 97 mmol/L — ABNORMAL LOW (ref 98–111)
Creatinine, Ser: 0.9 mg/dL (ref 0.44–1.00)
GFR calc Af Amer: >60 mL/min (ref >60)
GFR calc non Af Amer: >60 mL/min (ref >60)
Glucose, Bld: 177 mg/dL — ABNORMAL HIGH (ref 70–99)
Potassium: 4.9 mmol/L (ref 3.5–5.1)
Sodium: 129 mmol/L — ABNORMAL LOW (ref 135–145)

## 2020-05-08 LAB — TYPE AND SCREEN
ABO/RH(D): A POS
Antibody Screen: NEGATIVE
Unit division: 0

## 2020-05-08 LAB — BPAM RBC
Blood Product Expiration Date: 202110062359
ISSUE DATE / TIME: 202109141714
Unit Type and Rh: 6200

## 2020-05-08 LAB — GLUCOSE, CAPILLARY
Glucose-Capillary: 172 mg/dL — ABNORMAL HIGH (ref 70–99)
Glucose-Capillary: 173 mg/dL — ABNORMAL HIGH (ref 70–99)
Glucose-Capillary: 204 mg/dL — ABNORMAL HIGH (ref 70–99)

## 2020-05-08 LAB — C-REACTIVE PROTEIN: CRP: 12.2 mg/dL — ABNORMAL HIGH (ref <1.0)

## 2020-05-08 MED ORDER — MIDODRINE HCL 5 MG PO TABS: 5.0000 mg | ORAL_TABLET | Freq: Two times a day (BID) | ORAL | 0 refills | Status: AC

## 2020-05-08 MED ORDER — METOPROLOL SUCCINATE ER 25 MG PO TB24: 25.0000 mg | ORAL_TABLET | Freq: Every day | ORAL | 0 refills | Status: DC

## 2020-05-08 NOTE — TOC Progression Note (Signed)
Transition of Care [facility]) - Progression Note    Patient Details  Name: Leah Small MRN: [id number] Date of Birth: May 17, 1941  Transition of Care [facility]) CM/SW Contact  Allayne Butcher, RN Phone Number: 05/08/2020, 4:18 PM  Clinical Narrative:    Authorization for SNF approved for 3 days start date today, next review 9/17.  Auth ID [id number].    Expected Discharge Plan: Skilled Nursing Facility Barriers to Discharge: Barriers Resolved  Expected Discharge Plan and Services Expected Discharge Plan: Skilled Nursing Facility   Discharge Planning Services: CM Consult Post Acute Care Choice: Skilled Nursing Facility Living arrangements for the past 2 months: Single Family Home Expected Discharge Date: 05/08/20                         HH Arranged: NA HH Agency: NA Date HH Agency Contacted: 05/03/20 Time HH Agency Contacted: [phone] Representative spoke with at [facility] Agency: going to SNF   Social Determinants of Health (SDOH) Interventions    Readmission Risk Interventions No flowsheet data found.

## 2020-05-08 NOTE — Progress Notes (Signed)
Report called to Jody at [facility].  Patient will be transported via EMS.

## 2020-05-08 NOTE — TOC Transition Note (Signed)
[facility] - CM/SW Discharge Note   Patient Details  Name: Leah Small MRN: [id number] Date of Birth: 10/15/1940  Transition of Care Leah Small) CM/SW Contact:  Allayne Butcher, RN Phone Number: 05/08/2020, 3:49 PM   Clinical Narrative:    Patient has been medically cleared for discharge to SNF.  Patient is going to [facility] in [place].  Patient is going to room 802, bedside RN will call report to [place].  [facility] will provide transportation to the facility.  Patient's son Dorinda Hill has been updated on plan of care.    Final next level of care: Skilled Nursing Facility Barriers to Discharge: Barriers Resolved   Patient Goals and CMS Choice   CMS Medicare.gov Compare Post Acute Care list provided to:: Patient Represenative (must comment) Choice offered to / list presented to : Adult Children  Discharge Placement              Patient chooses bed at: [facility] Patient to be transferred to facility by: [facility] Name of family member notified: Dorinda Hill Patient and family notified of of transfer: 05/08/20  Discharge Plan and Services   Discharge Planning Services: CM Consult Post Acute Care Choice: Skilled Nursing Facility                    HH Arranged: NA [facility] Agency: NA Date HH Agency Contacted: 05/03/20 Time HH Agency Contacted: [phone] Representative spoke with at [facility] Agency: going to SNF  Social Determinants of Health (SDOH) Interventions     Readmission Risk Interventions No flowsheet data found.

## 2020-05-08 NOTE — Discharge Summary (Signed)
Physician Discharge Summary  Leah Small LAG:[id number] DOB: 04/10/1941 DOA: 04/26/2020  PCP: Idelle Crouch, MD  Admit date: 04/26/2020 Discharge date: 05/08/2020  Admitted From: Home Disposition: SNF  Recommendations for Outpatient Follow-up:  1. Follow up with PCP in 1-2 weeks 2. Follow-up with Dr. Janese Banks in 1 week 3. Follow-up with cardiology in 1 to 2 weeks 4. Please obtain BMP/CBC in one week 5. Please follow up with your PCP on the following pending results: Unresulted Labs (From admission, onward)          Start     Ordered   05/07/20 0500  C-reactive protein  Daily,   R     Question:  Specimen collection method  Answer:  Lab=Lab collect   05/06/20 0825           Home Health: None Equipment/Devices: None  Discharge Condition: Stable CODE STATUS: DNR Diet recommendation: Cardiac  Subjective: Seen and examined.  She has no complaints.  Brief/Interim Summary: Ciclaly Mulcahey Wrightis a 79 y.o.femalewith medical history significant forcoronary artery disease status post CABG, status post stent angioplasty, history of hypertension, diabetes mellitus,dyslipidemia and seizure disorder who presented to the ED via EMS for evaluation of chest pain.  Her troponins were significantly elevated (peaked at 15,900).She was admitted to the hospital for acute NSTEMI. She also had severe anemia and thrombocytopenia with hemoglobin of 6.1 and platelet count of 35,000 respectively. She was transfused with 2 units of packed red blood cells and H&H have remained stable. She was on aspirin and Plavix prior to admission but these were held temporarily. Both aspirin and Plavix have been resumed since anemia and thrombocytopenia have improved. Hematologist was consulted, recommended bone marrow biopsy for further evaluation which was done by IR on 05/01/2020.  The results are back and according to Dr. Elroy Channel note from 05/07/2020, it showed no leukemia, changes not entirely consistent with MDS  and she is awaiting for [facility] and NGS results.  She already discussed with son and would update him once final results are available.  Cardiologist consulted for acute NSTEMI in setting of severe anemia and known history of coronary artery disease.  They recommended medical management. This is being medically managed, but heparin had to be deferred due to severe anemia and thrombocytopenia.  She then developed hypotension.  Her ACEI is and Cardizem was held. She was started on midodrine for hypotension.  Admission has been prolonged by hyponatremia, hypotension, and now positive Covid-19 test.  Received monoclonal antibodies given asymptomatic and incidental positive.  PT recommends SNF vs 24/7 assistance at home.    Placement has been arranged for this patient and she is being discharged in stable condition.  New medications include Toprol-XL and midodrine.  Her midodrine can be discontinued if her blood pressure remains elevated.  Discharge Diagnoses:  Principal Problem:   Non-STEMI (non-ST elevated myocardial infarction) ([place]) Active Problems:   Diabetes mellitus with complication (HCC)   Essential hypertension   Complex partial seizure disorder (HCC)   Pressure injury of skin   Chest pain due to CAD ([place])   Anemia   Severe thrombocytopenia ([place])   Pancytopenia [facility])    Discharge Instructions  Discharge Instructions    Discharge patient   Complete by: As directed    Discharge disposition: 03-Skilled [place]   Discharge patient date: 05/08/2020     Allergies as of 05/08/2020      Reactions   Adhesive [tape] Other (See Comments)   Bruising and TEARING!!!  Please use "  paper" tape   Talwin [pentazocine] Anaphylaxis, Swelling   Throat swelling   Valium [diazepam] Other (See Comments)   Makes her feel like she's flying   Vicodin [hydrocodone-acetaminophen] Nausea And Vomiting   Beta Adrenergic Blockers Other (See Comments)   Pt states she not allergic   Levofloxacin  Swelling, Other (See Comments)   Tongue swells   Simvastatin Swelling, Rash   Mouth swelling      Medication List    STOP taking these medications   diltiazem 240 MG 24 hr capsule Commonly known as: CARDIZEM CD   ramipril 5 MG capsule Commonly known as: ALTACE     TAKE these medications   ascorbic acid 500 MG tablet Commonly known as: VITAMIN C Take 1 tablet (500 mg total) by mouth daily.   aspirin EC 81 MG tablet Take 81 mg by mouth at bedtime.   atorvastatin 40 MG tablet Commonly known as: LIPITOR Take 40 mg by mouth daily.   clopidogrel 75 MG tablet Commonly known as: PLAVIX Take 1 tablet (75 mg total) by mouth every morning. What changed: when to take this   folic acid 1 MG tablet Commonly known as: FOLVITE Take 1 mg by mouth daily.   gabapentin 100 MG capsule Commonly known as: NEURONTIN Take 100-200 mg by mouth daily as needed.   glipiZIDE 5 MG 24 hr tablet Commonly known as: GLUCOTROL XL Take 5 mg by mouth daily with breakfast.   lamoTRIgine 100 MG tablet Commonly known as: LAMICTAL Take 100 mg by mouth daily.   levETIRAcetam 500 MG tablet Commonly known as: KEPPRA Take 500 mg by mouth 2 (two) times daily.   metFORMIN 1000 MG tablet Commonly known as: GLUCOPHAGE Take 1 tablet (1,000 mg total) by mouth 2 (two) times daily.   metoprolol succinate 25 MG 24 hr tablet Commonly known as: TOPROL-XL Take 1 tablet (25 mg total) by mouth daily. Start taking on: May 09, 2020   midodrine 5 MG tablet Commonly known as: PROAMATINE Take 1 tablet (5 mg total) by mouth 2 (two) times daily with a meal.   nitroGLYCERIN 0.4 MG SL tablet Commonly known as: NITROSTAT Place 0.4 mg under the tongue every 5 (five) minutes as needed for chest pain.   nystatin cream Commonly known as: MYCOSTATIN Apply 1 application topically 2 (two) times daily as needed (yeast infection).   ranolazine 500 MG 12 hr tablet Commonly known as: Ranexa Take 1 tablet (500 mg  total) by mouth 2 (two) times daily.   Simbrinza 1-0.2 % Susp Generic drug: Brinzolamide-Brimonidine Place 1 drop into both eyes 2 (two) times daily.   timolol 0.5 % ophthalmic solution Commonly known as: TIMOPTIC Place 1 drop into both eyes 2 (two) times daily.   triamcinolone ointment 0.1 % Commonly known as: KENALOG Apply 1 application topically 3 (three) times daily as needed (itching from psoriasis).   vitamin B-12 1000 MCG tablet Commonly known as: CYANOCOBALAMIN Take 500 mcg by mouth every morning.   vitamin E 180 MG (400 UNITS) capsule Take 400 Units by mouth every morning.   zinc sulfate 220 (50 Zn) MG capsule Take 1 capsule (220 mg total) by mouth daily.       Contact information for follow-up providers    Idelle Crouch, MD Follow up in 1 week(s).   Specialty: Internal Medicine Contact information: [place] [address] [phone]            Contact information for after-discharge care  Destination    [facility] .   Service: Skilled Nursing Contact information: [address] [place] [phone]                 Allergies  Allergen Reactions  . Adhesive [Tape] Other (See Comments)    Bruising and TEARING!!!  Please use "paper" tape  . Talwin [Pentazocine] Anaphylaxis and Swelling    Throat swelling  . Valium [Diazepam] Other (See Comments)    Makes her feel like she's flying  . Vicodin [Hydrocodone-Acetaminophen] Nausea And Vomiting  . Beta Adrenergic Blockers Other (See Comments)    Pt states she not allergic  . Levofloxacin Swelling and Other (See Comments)    Tongue swells   . Simvastatin Swelling and Rash    Mouth swelling    Consultations: Cardiology, IR and oncology   Procedures/Studies: CT CHEST WO CONTRAST  Result Date: 05/04/2020 CLINICAL DATA:  Lung nodule follow-up seen on prior imaging in 2019 EXAM: CT CHEST WITHOUT  CONTRAST TECHNIQUE: Multidetector CT imaging of the chest was performed following the standard protocol without IV contrast. COMPARISON:  02/14/2018 FINDINGS: Cardiovascular: Calcific atheromatous plaque of the thoracic aorta. Cardiac enlargement, larger than on the prior study though low lung volumes could accentuate cardiac size. Mitral annular calcification. Extensive coronary artery calcification and signs of CABG following median sternotomy with similar appearance. Central pulmonary vasculature mildly engorged, less than 3 cm greatest caliber. Limited assessment of cardiovascular structures given lack of intravenous contrast. Mediastinum/Nodes: No axillary, mediastinal or thoracic inlet lymphadenopathy. Hilar structures without gross nodal enlargement. Lungs/Pleura: Numerous ill-defined ground-glass nodules with upper lobe predominance. No septal thickening. No consolidative change. No pleural effusion.  Midlung is involved bilaterally as well. A 1.3 by 0.8 cm area of amidst ground-glass changes in the RIGHT upper lobe on image 50 of series 4 serves as an example of this process with numerous scattered sub solid areas amidst ground-glass changes. No significant bronchial wall thickening Upper Abdomen: Post cholecystectomy. No acute process in the upper abdomen. Limited assessment. Musculoskeletal: Spinal degenerative change. No acute or destructive bone finding. IMPRESSION: 1. Numerous ill-defined ground-glass nodules with upper lobe predominance. A 1.3 by 0.8 cm area of amidst ground-glass changes in the RIGHT upper lobe on image 50 of series 4 serves as an example of this process with numerous scattered sub solid areas amidst ground-glass changes. Findings are nonspecific but favor an infectious or inflammatory process. Hypersensitivity pneumonitis could also be considered. Distribution is not classic for COVID-19 pneumonia but this should be excluded as well. Follow-up is suggested to ensure resolution of  above findings. 2. Suspected increase in cardiac size with extensive coronary artery disease and signs of prior CABG. No pericardial effusion. 3. Aortic atherosclerosis. Aortic Atherosclerosis (ICD10-I70.0). Electronically Signed   By: Zetta Bills M.D.   On: 05/04/2020 11:58   DG Chest Portable 1 View  Result Date: 04/26/2020 CLINICAL DATA:  Chest pain that started this morning pain all over to LEFT jaw and LEFT arm EXAM: PORTABLE CHEST 1 VIEW COMPARISON:  Dec 29, 2019 FINDINGS: Pacer defibrillator pad overlies the central chest and LEFT lower chest. Trachea midline. Heart size remains enlarged post median sternotomy for CABG. Hilar structures are stable. Lungs are clear.  No sign of pleural effusion. No acute skeletal process to the extent evaluated. IMPRESSION: No effusion or consolidation. Stable findings of postoperative changes related to median sternotomy and CABG. Electronically Signed   By: Zetta Bills M.D.   On: 04/26/2020  11:53   CT BONE MARROW BIOPSY & ASPIRATION  Result Date: 05/01/2020 INDICATION: Profound anemia of uncertain etiology. Please perform CT-guided bone marrow biopsy for tissue diagnostic purposes. EXAM: CT-GUIDED BONE MARROW BIOPSY AND ASPIRATION MEDICATIONS: None ANESTHESIA/SEDATION: Fentanyl 25 mcg IV; Versed 0.5 mg IV Sedation Time: 10 Minutes; The patient was continuously monitored during the procedure by the interventional radiology nurse under my direct supervision. COMPLICATIONS: None immediate. PROCEDURE: Informed consent was obtained from the patient following an explanation of the procedure, risks, benefits and alternatives. The patient understands, agrees and consents for the procedure. All questions were addressed. A time out was performed prior to the initiation of the procedure. The patient was positioned prone and non-contrast localization CT was performed of the pelvis to demonstrate the iliac marrow spaces. The operative site was prepped and draped in the usual  sterile fashion. Under sterile conditions and local anesthesia, a 22 gauge spinal needle was utilized for procedural planning. Next, an 11 gauge coaxial bone biopsy needle was advanced into the left iliac marrow space. Needle position was confirmed with CT imaging. Initially, a bone marrow aspiration was performed. Next, a bone marrow biopsy was obtained with the 11 gauge outer bone marrow device. The 11 gauge coaxial bone biopsy needle was re-advanced into a slightly different location within the left iliac marrow space, positioning was confirmed with CT imaging and an additional bone marrow biopsy was obtained. The needle was removed and superficial hemostasis was obtained with manual compression. A dressing was applied. The patient tolerated the procedure well without immediate post procedural complication. IMPRESSION: Successful CT guided left iliac bone marrow aspiration and core biopsy. Electronically Signed   By: Sandi Mariscal M.D.   On: 05/01/2020 10:54   ECHOCARDIOGRAM COMPLETE  Result Date: 04/29/2020    ECHOCARDIOGRAM REPORT   Patient Name:   DYANARA COZZA Date of Exam: 04/29/2020 Medical Rec #:  [id number]       Height:       66.0 in Accession #:    [id number]      Weight:       120.4 lb Date of Birth:  08/04/1941       BSA:          1.612 m Patient Age:    34 years        BP:           135/80 mmHg Patient Gender: F               HR:           75 bpm. Exam Location:  [facility] Procedure: 2D Echo, Color Doppler, Cardiac Doppler and Intracardiac            Opacification Agent Indications:     I24.9 Acute Coronary Syndrome  History:         Patient has prior history of Echocardiogram examinations, most                  recent 12/29/2019. Previous Myocardial Infarction and CAD, TIA;                  Risk Factors:Diabetes, Hypertension and HCL.  Sonographer:     Charmayne Sheer RDCS (AE) Referring Phys:  [id number] Clabe Seal Diagnosing Phys: Isaias Cowman MD  Sonographer Comments: Suboptimal apical window.  IMPRESSIONS  1. Left ventricular ejection fraction, by estimation, is 20 to 25%. The left ventricle has severely decreased function. The left ventricle demonstrates regional wall motion abnormalities (see scoring diagram/findings  for description). There is mild left  ventricular hypertrophy. Left ventricular diastolic parameters were normal.  2. Right ventricular systolic function is normal. The right ventricular size is normal. There is normal pulmonary artery systolic pressure.  3. The mitral valve is normal in structure. Moderate mitral valve regurgitation. No evidence of mitral stenosis.  4. Tricuspid valve regurgitation is moderate.  5. The aortic valve is normal in structure. Aortic valve regurgitation is not visualized. No aortic stenosis is present.  6. The inferior vena cava is normal in size with greater than 50% respiratory variability, suggesting right atrial pressure of 3 mmHg. FINDINGS  Left Ventricle: Left ventricular ejection fraction, by estimation, is 20 to 25%. The left ventricle has severely decreased function. The left ventricle demonstrates regional wall motion abnormalities. Definity contrast agent was given IV to delineate the left ventricular endocardial borders. The left ventricular internal cavity size was normal in size. There is mild left ventricular hypertrophy. Left ventricular diastolic parameters were normal.  LV Wall Scoring: The basal inferolateral segment, basal inferior segment, and basal inferoseptal segment are akinetic. The mid inferoseptal segment, apical septal segment, and apex are hypokinetic. Right Ventricle: The right ventricular size is normal. No increase in right ventricular wall thickness. Right ventricular systolic function is normal. There is normal pulmonary artery systolic pressure. The tricuspid regurgitant velocity is 2.38 m/s, and  with an assumed right atrial pressure of 10 mmHg, the estimated right ventricular systolic pressure is 30.0 mmHg. Left Atrium:  Left atrial size was normal in size. Right Atrium: Right atrial size was normal in size. Pericardium: There is no evidence of pericardial effusion. Mitral Valve: The mitral valve is normal in structure. Normal mobility of the mitral valve leaflets. Moderate mitral valve regurgitation. No evidence of mitral valve stenosis. MV peak gradient, 6.1 mmHg. The mean mitral valve gradient is 2.0 mmHg. Tricuspid Valve: The tricuspid valve is normal in structure. Tricuspid valve regurgitation is moderate . No evidence of tricuspid stenosis. Aortic Valve: The aortic valve is normal in structure. Aortic valve regurgitation is not visualized. No aortic stenosis is present. Aortic valve mean gradient measures 3.0 mmHg. Aortic valve peak gradient measures 5.9 mmHg. Aortic valve area, by VTI measures 1.24 cm. Pulmonic Valve: The pulmonic valve was normal in structure. Pulmonic valve regurgitation is not visualized. No evidence of pulmonic stenosis. Aorta: The aortic root is normal in size and structure. Venous: The inferior vena cava is normal in size with greater than 50% respiratory variability, suggesting right atrial pressure of 3 mmHg. IAS/Shunts: No atrial level shunt detected by color flow Doppler.  LEFT VENTRICLE PLAX 2D LVIDd:         5.29 cm  Diastology LVIDs:         4.88 cm  LV e' lateral:   7.40 cm/s LV PW:         0.90 cm  LV E/e' lateral: 15.9 LV IVS:        0.69 cm  LV e' medial:    2.61 cm/s LVOT diam:     1.80 cm  LV E/e' medial:  45.2 LV SV:         25 LV SV Index:   16 LVOT Area:     2.54 cm  RIGHT VENTRICLE RV Basal diam:  3.24 cm LEFT ATRIUM             Index       RIGHT ATRIUM           Index LA diam:  3.40 cm 2.11 cm/m  RA Area:     12.70 cm LA Vol (A2C):   50.4 ml 31.27 ml/m RA Volume:   30.90 ml  19.17 ml/m LA Vol (A4C):   56.7 ml 35.18 ml/m LA Biplane Vol: 53.8 ml 33.38 ml/m  AORTIC VALVE                   PULMONIC VALVE AV Area (Vmax):    1.18 cm    PV Vmax:       0.78 m/s AV Area (Vmean):    1.19 cm    PV Vmean:      47.100 cm/s AV Area (VTI):     1.24 cm    PV VTI:        0.114 m AV Vmax:           121.00 cm/s PV Peak grad:  2.4 mmHg AV Vmean:          79.600 cm/s PV Mean grad:  1.0 mmHg AV VTI:            0.204 m AV Peak Grad:      5.9 mmHg AV Mean Grad:      3.0 mmHg LVOT Vmax:         56.10 cm/s LVOT Vmean:        37.200 cm/s LVOT VTI:          0.099 m LVOT/AV VTI ratio: 0.49  AORTA Ao Root diam: 3.00 cm MITRAL VALVE                TRICUSPID VALVE MV Area (PHT): 4.89 cm     TR Peak grad:   22.7 mmHg MV Peak grad:  6.1 mmHg     TR Vmax:        238.00 cm/s MV Mean grad:  2.0 mmHg MV Vmax:       1.23 m/s     SHUNTS MV Vmean:      62.3 cm/s    Systemic VTI:  0.10 m MV Decel Time: 155 msec     Systemic Diam: 1.80 cm MV E velocity: 118.00 cm/s MV A velocity: 43.30 cm/s MV E/A ratio:  2.73 Isaias Cowman MD Electronically signed by Isaias Cowman MD Signature Date/Time: 04/29/2020/12:05:08 PM    Final       Discharge Exam: Vitals:   05/08/20 0750 05/08/20 1112  BP: 116/66 122/67  Pulse: 77 74  Resp: (!) 21 20  Temp: 97.7 F (36.5 C) 97.8 F (36.6 C)  SpO2: 94% 97%   Vitals:   05/07/20 2328 05/08/20 0453 05/08/20 0750 05/08/20 1112  BP: (!) 156/77 (!) 145/70 116/66 122/67  Pulse: 91 78 77 74  Resp: 16 16 (!) 21 20  Temp: 98 F (36.7 C) 97.7 F (36.5 C) 97.7 F (36.5 C) 97.8 F (36.6 C)  TempSrc: Oral Oral Oral Oral  SpO2: 96% 95% 94% 97%  Weight:      Height:        General: Pt is alert, awake, not in acute distress Cardiovascular: RRR, S1/S2 +, no rubs, no gallops Respiratory: CTA bilaterally, no wheezing, no rhonchi Abdominal: Soft, NT, ND, bowel sounds + Extremities: no edema, no cyanosis    The results of significant diagnostics from this hospitalization (including imaging, microbiology, ancillary and laboratory) are listed below for reference.     Microbiology: Recent Results (from the past 240 hour(s))  SARS Coronavirus 2 by RT PCR (hospital  order, performed in [facility] hospital  lab) Nasopharyngeal Nasopharyngeal Swab     Status: Abnormal   Collection Time: 05/04/20  5:17 PM   Specimen: Nasopharyngeal Swab  Result Value Ref Range Status   SARS Coronavirus 2 POSITIVE (A) NEGATIVE Final    Comment: RESULT CALLED TO, READ BACK BY AND VERIFIED WITH: DIANE ZAENGLE AT 1826 05/04/20.PMF (NOTE) SARS-CoV-2 target nucleic acids are DETECTED  SARS-CoV-2 RNA is generally detectable in upper respiratory specimens  during the acute phase of infection.  Positive results are indicative  of the presence of the identified virus, but do not rule out bacterial infection or co-infection with other pathogens not detected by the test.  Clinical correlation with patient history and  other diagnostic information is necessary to determine patient infection status.  The expected result is negative.  Fact Sheet for Patients:   BoilerBrush.com.cy   Fact Sheet for Healthcare Providers:   https://pope.com/    This test is not yet approved or cleared by the [place] FDA and  has been authorized for detection and/or diagnosis of SARS-CoV-2 by FDA under an Emergency Use Authorization (EUA).  This EUA will remain in effect (meaning this tes t can be used) for the duration of  the COVID-19 declaration under Section 564(b)(1) of the Act, 21 U.S.C. section 360-bbb-3(b)(1), unless the authorization is terminated or revoked sooner.  Performed at [facility], [address] [place], [place] [address]      Labs: BNP (last 3 results) No results for input(s): BNP in the last 8760 hours. Basic Metabolic Panel: Recent Labs  Lab 05/02/20 0523 05/02/20 0523 05/03/20 0548 05/03/20 1558 05/04/20 0403 05/05/20 0425 05/06/20 0536 05/07/20 0656 05/08/20 0536  NA 127*   < > 127*   < > 127* 127* 128* 128* 129*  K 4.2   < > 4.4  --  4.4 4.5 4.6 4.7 4.9  CL 98   < > 99  --  99 99 98 98 97*   CO2 19*   < > 18*  --  19* 21* 20* 21* 22  GLUCOSE 186*   < > 258*  --  155* 167* 119* 125* 177*  BUN 34*   < > 32*  --  33* 37* 31* 30* 27*  CREATININE 0.98   < > 0.82  --  1.07* 1.07* 0.93 0.82 0.90  CALCIUM 8.7*   < > 8.7*  --  8.7* 8.9 8.9 8.6* 8.8*  MG 1.8  --  1.7  --  2.2  --   --  2.1  --    < > = values in this interval not displayed.   Liver Function Tests: No results for input(s): AST, ALT, ALKPHOS, BILITOT, PROT, ALBUMIN in the last 168 hours. No results for input(s): LIPASE, AMYLASE in the last 168 hours. No results for input(s): AMMONIA in the last 168 hours. CBC: Recent Labs  Lab 05/04/20 0403 05/04/20 0403 05/05/20 0425 05/06/20 0536 05/07/20 0656 05/07/20 2307 05/08/20 0536  WBC 5.8  --  6.6 7.1 6.6  --  8.7  NEUTROABS  --   --   --  3.4  --   --   --   HGB 8.3*   < > 8.5* 8.6* 7.8* 10.5* 10.3*  HCT 23.4*   < > 23.2* 24.1* 22.5* 30.7* 28.5*  MCV 92.1  --  92.4 92.0 97.0  --  90.5  PLT 455*  --  541* 649* 542*  --  548*   < > = values in this interval  not displayed.   Cardiac Enzymes: No results for input(s): CKTOTAL, CKMB, CKMBINDEX, TROPONINI in the last 168 hours. BNP: Invalid input(s): POCBNP CBG: Recent Labs  Lab 05/07/20 1652 05/07/20 2117 05/08/20 0142 05/08/20 0748 05/08/20 1112  GLUCAP 218* 196* 172* 173* 204*   D-Dimer No results for input(s): DDIMER in the last 72 hours. Hgb A1c No results for input(s): HGBA1C in the last 72 hours. Lipid Profile No results for input(s): CHOL, HDL, LDLCALC, TRIG, CHOLHDL, LDLDIRECT in the last 72 hours. Thyroid function studies No results for input(s): TSH, T4TOTAL, T3FREE, THYROIDAB in the last 72 hours.  Invalid input(s): FREET3 Anemia work up No results for input(s): VITAMINB12, FOLATE, FERRITIN, TIBC, IRON, RETICCTPCT in the last 72 hours. Urinalysis    Component Value Date/Time   COLORURINE COLORLESS (A) 12/28/2019 1818   APPEARANCEUR CLEAR (A) 12/28/2019 1818   LABSPEC 1.014 12/28/2019  1818   PHURINE 6.0 12/28/2019 1818   GLUCOSEU 50 (A) 12/28/2019 1818   HGBUR NEGATIVE 12/28/2019 1818   BILIRUBINUR NEGATIVE 12/28/2019 1818   KETONESUR NEGATIVE 12/28/2019 1818   PROTEINUR NEGATIVE 12/28/2019 1818   UROBILINOGEN 0.2 07/06/2015 1558   NITRITE NEGATIVE 12/28/2019 1818   LEUKOCYTESUR NEGATIVE 12/28/2019 1818   Sepsis Labs Invalid input(s): PROCALCITONIN,  WBC,  LACTICIDVEN Microbiology Recent Results (from the past 240 hour(s))  SARS Coronavirus 2 by RT PCR (hospital order, performed in [place] hospital lab) Nasopharyngeal Nasopharyngeal Swab     Status: Abnormal   Collection Time: 05/04/20  5:17 PM   Specimen: Nasopharyngeal Swab  Result Value Ref Range Status   SARS Coronavirus 2 POSITIVE (A) NEGATIVE Final    Comment: RESULT CALLED TO, READ BACK BY AND VERIFIED WITH: DIANE ZAENGLE AT 1826 05/04/20.PMF (NOTE) SARS-CoV-2 target nucleic acids are DETECTED  SARS-CoV-2 RNA is generally detectable in upper respiratory specimens  during the acute phase of infection.  Positive results are indicative  of the presence of the identified virus, but do not rule out bacterial infection or co-infection with other pathogens not detected by the test.  Clinical correlation with patient history and  other diagnostic information is necessary to determine patient infection status.  The expected result is negative.  Fact Sheet for Patients:   StrictlyIdeas.no   Fact Sheet for Healthcare Providers:   BankingDealers.co.za    This test is not yet approved or cleared by the [place] FDA and  has been authorized for detection and/or diagnosis of SARS-CoV-2 by FDA under an Emergency Use Authorization (EUA).  This EUA will remain in effect (meaning this tes t can be used) for the duration of  the COVID-19 declaration under Section 564(b)(1) of the Act, 21 U.S.C. section 360-bbb-3(b)(1), unless the authorization is terminated or  revoked sooner.  Performed at [facility], [address] [place], [place] [address]      Time coordinating discharge: Over 30 minutes  SIGNED:   Darliss Cheney, MD  [facility] 05/08/2020, 2:18 PM  If 7PM-7AM, please contact night-coverage [email or web address]

## 2020-05-08 NOTE — Discharge Instructions (Signed)
Heart Attack A heart attack occurs when blood and oxygen supply to the heart is cut off. A heart attack causes damage to the heart that cannot be fixed. A heart attack is also called a myocardial infarction, or MI. If you think you are having a heart attack, do not wait to see if the symptoms will go away. Get medical help right away. What are the causes? This condition may be caused by:  A fatty substance (plaque) in the blood vessels (arteries). This can block the flow of blood to the heart.  A blood clot in the blood vessels that go to the heart. The blood clot blocks blood flow.  Low blood pressure.  An abnormal heartbeat.  Some diseases, such as problems in red blood cells (anemia)orproblems in breathing (respiratory failure).  Tightening (spasm) of a blood vessel that cuts off blood to the heart.  A tear in a blood vessel of the heart.  High blood pressure. What increases the risk? The following factors may make you more likely to develop this condition:  Aging. The older you are, the higher your risk.  Having a personal or family history of chest pain, heart attack, stroke, or narrowing of the arteries in the legs, arms, head, or stomach (peripheral artery disease).  Being female.  Smoking.  Not getting regular exercise.  Being overweight or obese.  Having high blood pressure.  Having high cholesterol.  Having diabetes.  Drinking too much alcohol.  Using illegal drugs, such as cocaine or methamphetamine. What are the signs or symptoms? Symptoms of this condition include:  Chest pain. It may feel like: ? Crushing or squeezing. ? Tightness, pressure, fullness, or heaviness.  Pain in the arm, neck, jaw, back, or upper body.  Shortness of breath.  Heartburn.  Upset stomach (indigestion).  Feeling like you may vomit (nauseous).  Cold sweats.  Feeling tired.  Sudden light-headedness. How is this treated? A heart attack must be treated as soon as  possible. Treatment may include:  Medicines to: ? Break up or dissolve blood clots. ? Thin blood and help prevent blood clots. ? Treat blood pressure. ? Improve blood flow to the heart. ? Reduce pain. ? Reduce cholesterol.  Procedures to widen a blocked artery and keep it open.  Open heart surgery.  Receiving oxygen.  Making your heart strong again (cardiac rehabilitation) through exercise, education, and counseling. Follow these instructions at home: Medicines  Take over-the-counter and prescription medicines only as told by your doctor. You may need to take medicine: ? To keep your blood from clotting too easily. ? To control blood pressure. ? To lower cholesterol. ? To control heart rhythms.  Do not take these medicines unless your doctor says it is okay: ? NSAIDs, such as ibuprofen. ? Supplements that have vitamin A, vitamin E, or both. ? Hormone replacement therapy that has estrogen with or without progestin. Lifestyle      Do not use any products that have nicotine or tobacco, such as cigarettes, e-cigarettes, and chewing tobacco. If you need help quitting, ask your doctor.  Avoid secondhand smoke.  Exercise regularly. Ask your doctor about a cardiac rehab program.  Eat heart-healthy foods. Your doctor will tell you what foods to eat.  Stay at a healthy weight.  Lower your stress level.  Do not use illegal drugs. Alcohol use  Do not drink alcohol if: ? Your doctor tells you not to drink. ? You are pregnant, may be pregnant, or are planning to become pregnant.    If you drink alcohol: ? Limit how much you use to:  0-1 drink a day for women.  0-2 drinks a day for men. ? Know how much alcohol is in your drink. In the [place] one drink equals one 12 oz bottle of beer (355 mL), one 5 oz glass of wine (148 mL), or one 1 oz glass of hard liquor (44 mL). General instructions  Work with your doctor to treat other problems you may have, such as diabetes or high  blood pressure.  Get screened for depression. Get treatment if needed.  Keep your vaccines up to date. Get the flu shot (influenza vaccine) every year.  Keep all follow-up visits as told by your doctor. This is important. Contact a doctor if:  You feel very sad.  You have trouble doing your daily activities. Get help right away if:  You have sudden, unexplained discomfort in your chest, arms, back, neck, jaw, or upper body.  You have shortness of breath.  You have sudden sweating or clammy skin.  You feel like you may vomit.  You vomit.  You feel tired or weak.  You get light-headed or dizzy.  You feel your heart beating fast.  You feel your heart skipping beats.  You have blood pressure that is higher than 180/120. These symptoms may be an emergency. Do not wait to see if the symptoms will go away. Get medical help right away. Call your local emergency services (911 in the [place] Do not drive yourself to the hospital. Summary  A heart attack occurs when blood and oxygen supply to the heart is cut off.  Do not take NSAIDs unless your doctor says it is okay.  Do not smoke. Avoid secondhand smoke.  Exercise regularly. Ask your doctor about a cardiac rehab program. This information is not intended to replace advice given to you by your health care provider. Make sure you discuss any questions you have with your health care provider. Document Revised: 11/21/2018 Document Reviewed: 11/21/2018 Elsevier Patient Education  2020 [employer]  

## 2020-05-08 NOTE — TOC Initial Note (Signed)
Transition of Care [facility]) - Initial/Assessment Note    Patient Details  Name: CHASSITY LUDKE MRN: [id number] Date of Birth: [birth year]  Transition of Care [facility]) CM/SW Contact:    Allayne Butcher, RN Phone Number: 05/08/2020, 1:03 PM  Clinical Narrative:                 Patient is from home where she lives with her son, Loraine Leriche and his girlfriend.  Son Dorinda Hill provides transportation.  Patient has a cane and rollator at home.  PT is recommending SNF and son Dorinda Hill agrees.  He would like for her to go to [facility].  Bed request sent to [facility] yesterday.    Expected Discharge Plan: Skilled Nursing Facility Barriers to Discharge: No SNF bed   Patient Goals and CMS Choice   CMS Medicare.gov Compare Post Acute Care list provided to:: Patient Represenative (must comment) Choice offered to / list presented to : Adult Children  Expected Discharge Plan and Services Expected Discharge Plan: Skilled Nursing Facility   Discharge Planning Services: CM Consult Post Acute Care Choice: Skilled Nursing Facility Living arrangements for the past 2 months: Single Family Home                           HH Arranged: NA HH Agency: NA Date HH Agency Contacted: 05/03/20 Time HH Agency Contacted: 1037 Representative spoke with at [facility] Agency: going to SNF  Prior Living Arrangements/Services Living arrangements for the past 2 months: Single Family Home Lives with:: Adult Children Patient language and need for interpreter reviewed:: Yes Do you feel safe going back to the place where you live?: Yes      Need for Family Participation in Patient Care: Yes (Comment) (COVID) Care giver support system in place?: Yes (comment) (sons) Current home services: DME (can and rollator) Criminal Activity/Legal Involvement Pertinent to Current Situation/Hospitalization: No - Comment as needed  Activities of Daily Living Home Assistive Devices/Equipment: Environmental consultant (specify type), Cane (specify quad or straight), CBG  Meter, Blood pressure cuff, Eyeglasses ADL Screening (condition at time of admission) Patient's cognitive ability adequate to safely complete daily activities?: Yes Is the patient deaf or have difficulty hearing?: No Does the patient have difficulty seeing, even when wearing glasses/contacts?: No Does the patient have difficulty concentrating, remembering, or making decisions?: No Patient able to express need for assistance with ADLs?: Yes Does the patient have difficulty dressing or bathing?: Yes Independently performs ADLs?: No Does the patient have difficulty walking or climbing stairs?: Yes Weakness of Legs: Both Weakness of Arms/Hands: None  Permission Sought/Granted Permission sought to share information with : Case Manager, Magazine features editor, Family Supports Permission granted to share information with : Yes, Verbal Permission Granted  Share Information with NAME: Dorinda Hill and Loraine Leriche  Permission granted to share info w AGENCY: Armed forces operational officer granted to share info w Relationship: sons     Emotional Assessment       Orientation: : Oriented to Self, Oriented to Place, Oriented to Situation Alcohol / Substance Use: Not Applicable Psych Involvement: No (comment)  Admission diagnosis:  Thrombocytopenia (HCC) [D69.6] Nonspecific chest pain [R07.9] Anemia, unspecified type [D64.9] Chest pain due to CAD [facility]) [I25.119] Patient Active Problem List   Diagnosis Date Noted  . Chest pain due to CAD (HCC) 04/26/2020  . Anemia 04/26/2020  . Severe thrombocytopenia (HCC) 04/26/2020  . Pancytopenia (HCC)   . Iron deficiency anemia 01/25/2020  . History of ETT   .  Acute respiratory failure with hypoxia (HCC)   . Palliative care by specialist   . Goals of care, counseling/discussion   . Pressure injury of skin 12/30/2019  . STEMI (ST elevation myocardial infarction) (HCC) 12/28/2019  . Malnutrition of moderate degree 08/12/2017  . Hypotension   . Carotid  stenosis 11/12/2016  . Hypertensive heart disease 01/29/2016  . Normocytic anemia 01/29/2016  . History of vaginal bleeding 01/29/2016  . GERD (gastroesophageal reflux disease) 01/29/2016  . NSTEMI (non-ST elevated myocardial infarction) (HCC) 10/22/2015  . Non-STEMI (non-ST elevated myocardial infarction) (HCC) 10/22/2015  . 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 Coronary atherosclerosis of native coronary artery 10/22/2015  . Angina pectoris (HCC)   . Memory deficit   . Stroke (HCC) 07/06/2015  . Diabetes mellitus with complication (HCC) 07/06/2015  . Essential hypertension 07/06/2015  . Hyperlipidemia 07/06/2015  . Left-sided weakness    PCP:  Marguarite Arbour, MD Pharmacy:   [facility], [place] - [address] [place] [place] [address] Phone: [phone] Fax: [phone]     Social Determinants of Health (SDOH) Interventions    Readmission Risk Interventions No flowsheet data found.

## 2020-05-10 ENCOUNTER — Encounter (HOSPITAL_COMMUNITY): Payer: Self-pay | Admitting: Oncology

## 2020-05-13 ENCOUNTER — Inpatient Hospital Stay: Payer: Medicare Other

## 2020-05-15 ENCOUNTER — Encounter (HOSPITAL_COMMUNITY): Payer: Self-pay | Admitting: Oncology

## 2020-05-20 LAB — SURGICAL PATHOLOGY

## 2020-05-23 ENCOUNTER — Inpatient Hospital Stay: Payer: Medicare Other

## 2020-05-23 ENCOUNTER — Inpatient Hospital Stay: Payer: Medicare Other | Admitting: Oncology

## 2020-05-27 ENCOUNTER — Telehealth: Payer: Self-pay

## 2020-05-27 NOTE — Telephone Encounter (Signed)
Spoke with patient's son to check in and to offer to schedule a visit with Angelique Holm, Palliative NP. Visit scheduled for 06/11/20 @ 3pm.

## 2020-05-28 ENCOUNTER — Inpatient Hospital Stay (HOSPITAL_BASED_OUTPATIENT_CLINIC_OR_DEPARTMENT_OTHER): Payer: Medicare Other | Admitting: Oncology

## 2020-05-28 ENCOUNTER — Inpatient Hospital Stay: Payer: Medicare Other | Attending: Oncology

## 2020-05-28 ENCOUNTER — Encounter: Payer: Self-pay | Admitting: Oncology

## 2020-05-28 ENCOUNTER — Other Ambulatory Visit: Payer: Self-pay

## 2020-05-28 VITALS — BP 170/76 | HR 88 | Temp 96.3°F | Resp 16 | Wt 117.2 lb

## 2020-05-28 DIAGNOSIS — U071 COVID-19: Secondary | ICD-10-CM | POA: Diagnosis not present

## 2020-05-28 DIAGNOSIS — D649 Anemia, unspecified: Secondary | ICD-10-CM | POA: Insufficient documentation

## 2020-05-28 DIAGNOSIS — Z803 Family history of malignant neoplasm of breast: Secondary | ICD-10-CM | POA: Insufficient documentation

## 2020-05-28 DIAGNOSIS — Z8673 Personal history of transient ischemic attack (TIA), and cerebral infarction without residual deficits: Secondary | ICD-10-CM | POA: Insufficient documentation

## 2020-05-28 DIAGNOSIS — Z9049 Acquired absence of other specified parts of digestive tract: Secondary | ICD-10-CM | POA: Diagnosis not present

## 2020-05-28 DIAGNOSIS — R002 Palpitations: Secondary | ICD-10-CM | POA: Insufficient documentation

## 2020-05-28 DIAGNOSIS — D509 Iron deficiency anemia, unspecified: Secondary | ICD-10-CM

## 2020-05-28 DIAGNOSIS — I700 Atherosclerosis of aorta: Secondary | ICD-10-CM | POA: Diagnosis not present

## 2020-05-28 DIAGNOSIS — I2510 Atherosclerotic heart disease of native coronary artery without angina pectoris: Secondary | ICD-10-CM | POA: Diagnosis not present

## 2020-05-28 DIAGNOSIS — Z885 Allergy status to narcotic agent status: Secondary | ICD-10-CM | POA: Insufficient documentation

## 2020-05-28 DIAGNOSIS — E119 Type 2 diabetes mellitus without complications: Secondary | ICD-10-CM | POA: Insufficient documentation

## 2020-05-28 DIAGNOSIS — I10 Essential (primary) hypertension: Secondary | ICD-10-CM | POA: Insufficient documentation

## 2020-05-28 DIAGNOSIS — K219 Gastro-esophageal reflux disease without esophagitis: Secondary | ICD-10-CM | POA: Diagnosis not present

## 2020-05-28 DIAGNOSIS — Z888 Allergy status to other drugs, medicaments and biological substances status: Secondary | ICD-10-CM | POA: Diagnosis not present

## 2020-05-28 DIAGNOSIS — Z833 Family history of diabetes mellitus: Secondary | ICD-10-CM | POA: Diagnosis not present

## 2020-05-28 DIAGNOSIS — J1282 Pneumonia due to coronavirus disease 2019: Secondary | ICD-10-CM | POA: Diagnosis not present

## 2020-05-28 DIAGNOSIS — E785 Hyperlipidemia, unspecified: Secondary | ICD-10-CM | POA: Diagnosis not present

## 2020-05-28 DIAGNOSIS — I252 Old myocardial infarction: Secondary | ICD-10-CM | POA: Insufficient documentation

## 2020-05-28 DIAGNOSIS — D638 Anemia in other chronic diseases classified elsewhere: Secondary | ICD-10-CM | POA: Diagnosis not present

## 2020-05-28 DIAGNOSIS — Z8249 Family history of ischemic heart disease and other diseases of the circulatory system: Secondary | ICD-10-CM | POA: Insufficient documentation

## 2020-05-28 DIAGNOSIS — Z79899 Other long term (current) drug therapy: Secondary | ICD-10-CM | POA: Diagnosis not present

## 2020-05-28 DIAGNOSIS — R7989 Other specified abnormal findings of blood chemistry: Secondary | ICD-10-CM | POA: Insufficient documentation

## 2020-05-28 DIAGNOSIS — Z881 Allergy status to other antibiotic agents status: Secondary | ICD-10-CM | POA: Insufficient documentation

## 2020-05-28 LAB — CBC WITH DIFFERENTIAL/PLATELET
Abs Immature Granulocytes: 0.05 10*3/uL (ref 0.00–0.07)
Basophils Absolute: 0.1 10*3/uL (ref 0.0–0.1)
Basophils Relative: 1 %
Eosinophils Absolute: 0.2 10*3/uL (ref 0.0–0.5)
Eosinophils Relative: 3 %
HCT: 29.3 % — ABNORMAL LOW (ref 36.0–46.0)
Hemoglobin: 9.7 g/dL — ABNORMAL LOW (ref 12.0–15.0)
Immature Granulocytes: 1 %
Lymphocytes Relative: 13 %
Lymphs Abs: 1 10*3/uL (ref 0.7–4.0)
MCH: 33.6 pg (ref 26.0–34.0)
MCHC: 33.1 g/dL (ref 30.0–36.0)
MCV: 101.4 fL — ABNORMAL HIGH (ref 80.0–100.0)
Monocytes Absolute: 0.5 10*3/uL (ref 0.1–1.0)
Monocytes Relative: 6 %
Neutro Abs: 5.7 10*3/uL (ref 1.7–7.7)
Neutrophils Relative %: 76 %
Platelets: 230 10*3/uL (ref 150–400)
RBC: 2.89 MIL/uL — ABNORMAL LOW (ref 3.87–5.11)
RDW: 19.6 % — ABNORMAL HIGH (ref 11.5–15.5)
WBC: 7.4 10*3/uL (ref 4.0–10.5)
nRBC: 0 % (ref 0.0–0.2)

## 2020-05-28 LAB — IRON AND TIBC
Iron: 69 ug/dL (ref 28–170)
Saturation Ratios: 20 % (ref 10.4–31.8)
TIBC: 342 ug/dL (ref 250–450)
UIBC: 273 ug/dL

## 2020-05-28 LAB — FERRITIN: Ferritin: 405 ng/mL — ABNORMAL HIGH (ref 11–307)

## 2020-05-31 NOTE — Progress Notes (Signed)
Hematology/Oncology Consult note [facility]  Telephone:([phone] [phone]  Patient Care Team: Idelle Crouch, MD as PCP - General (Internal Medicine) Sindy Guadeloupe, MD as Consulting Physician (Oncology)   Name of the patient: Leah Small  [id number]  1941-06-29   Date of visit: 05/31/20  Diagnosis- anemia multifactorial likely secondary to chronic disease as well as iron deficiency  Chief complaint/ Reason for visit-routine follow-up of anemia  Heme/Onc history:  Patient is a 79 year old female with a past medical history significant for coronary artery disease, CVA, CABG, hypertension hyperlipidemia diabetes GERD among other medical problems. She was recently seen by cardiology for fatigue and palpitations. She has been referred to hematology for anemia. Most recent CBC from 12/04/2019 showed white cell count of 5.7, H&H of 7.5/23.3 and a platelet count of 162. MCV was 99. Looking back at her prior CBCs patient has had chronic anemia with a hemoglobin which was around 10 up until January 2021 after which it dropped to 7.8. CMP showed normal liver and kidney functions. Hepatitis B and C testing has been negative. TSH in January was normal.  Results of blood work from 12/18/2019 were as follows: CBC showed white count of 7.4, H&H of 8.5/27.8 and a platelet count of 537. CMP was significant for mildly elevated creatinine of 1.1. B12 levels were elevated. Folate was normal. TSH was normal. Haptoglobin was normal. Myeloma panel showed no M protein. B1and B6 levels were normal.Coombs test was negative. EPO levels were elevated at 54. LDH was normal. Iron study showed a low ferritin of 11 with elevated TIBC of 545.  Patient was admitted following to the hospital following that visit and was found to have Covid pneumonia.She presented with chest pain but during cardiac cath had a complications for which the procedure was aborted and  medical management was recommended. She was given 1 unit of blood transfusion while she was in the hospital when her Hemoglobin dropped to 7. She also received IV iron.    Interval history-patient is doing better after her recent hospital discharge.  She has chronic fatigue.  No new complaints.  Denies any chest pain or shortness of breath.  Denies any blood loss in stool or urine  ECOG PS- 2 Pain scale- 0   Review of systems- Review of Systems  Constitutional: Positive for malaise/fatigue. Negative for chills, fever and weight loss.  HENT: Negative for congestion, ear discharge and nosebleeds.   Eyes: Negative for blurred vision.  Respiratory: Negative for cough, hemoptysis, sputum production, shortness of breath and wheezing.   Cardiovascular: Negative for chest pain, palpitations, orthopnea and claudication.  Gastrointestinal: Negative for abdominal pain, blood in stool, constipation, diarrhea, heartburn, melena, nausea and vomiting.  Genitourinary: Negative for dysuria, flank pain, frequency, hematuria and urgency.  Musculoskeletal: Negative for back pain, joint pain and myalgias.  Skin: Negative for rash.  Neurological: Negative for dizziness, tingling, focal weakness, seizures, weakness and headaches.  Endo/Heme/Allergies: Does not bruise/bleed easily.  Psychiatric/Behavioral: Negative for depression and suicidal ideas. The patient does not have insomnia.       Allergies  Allergen Reactions  . Adhesive [Tape] Other (See Comments)    Bruising and TEARING!!!  Please use "paper" tape  . Talwin [Pentazocine] Anaphylaxis and Swelling    Throat swelling  . Valium [Diazepam] Other (See Comments)    Makes her feel like she's flying  . Vicodin [Hydrocodone-Acetaminophen] Nausea And Vomiting  . Beta Adrenergic Blockers Other (See Comments)  Pt states she not allergic  . Levofloxacin Swelling and Other (See Comments)    Tongue swells   . Simvastatin Swelling and Rash    Mouth  swelling     Past Medical History:  Diagnosis Date  . Acid reflux   . Arthritis    "all over"  . Chronic stable angina (HCC)   . Coronary artery disease   . Hypercholesterolemia   . Hypertension   . Myocardial infarction (HCC) 1991; 07/06/2015  . NSTEMI (non-ST elevated myocardial infarction) (HCC) 10/22/2015  . Psoriatic arthritis (HCC)   . 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 TIA (transient ischemic attack)    "several over the last 20 years" (10/22/2015)  . Type II diabetes mellitus (HCC)      Past Surgical History:  Procedure Laterality Date  . ABDOMINAL HYSTERECTOMY  1973  . APPENDECTOMY  1950s   elementary school  . CARDIAC CATHETERIZATION  X 2-3  . CARDIAC CATHETERIZATION N/A 11/21/2015   Procedure: Left Heart Cath and Coronary Angiography;  Surgeon: Corky Crafts, MD;  Location: [facility] INVASIVE CV LAB;  Service: Cardiovascular;  Laterality: N/A;  . CARDIAC CATHETERIZATION  11/21/2015   Procedure: Coronary Stent Intervention;  Surgeon: Corky Crafts, MD;  Location: [facility] INVASIVE CV LAB;  Service: Cardiovascular;;  . CARDIAC CATHETERIZATION N/A 01/29/2016   Procedure: Left Heart Cath and Cors/Grafts Angiography;  Surgeon: Tonny Bollman, MD;  Location: [facility] INVASIVE CV LAB;  Service: Cardiovascular;  Laterality: N/A;  . CARDIAC CATHETERIZATION N/A 07/22/2016   Procedure: Left Heart Cath and Coronary Angiography;  Surgeon: Iran Ouch, MD;  Location: [facility] INVASIVE CV LAB;  Service: Cardiovascular;  Laterality: N/A;  . CAROTID STENT INSERTION Bilateral 2011-2012   right-left  . CATARACT EXTRACTION W/ INTRAOCULAR LENS  IMPLANT, BILATERAL Bilateral 2009-2010  . CHOLECYSTECTOMY OPEN  ~ 2014  . CORONARY ANGIOPLASTY    . CORONARY ANGIOPLASTY WITH STENT PLACEMENT  11/21/2015  . CORONARY ARTERY BYPASS GRAFT  08/1995  . CORONARY/GRAFT ACUTE MI REVASCULARIZATION N/A 12/28/2019   Procedure: Coronary/Graft Acute MI  Revascularization;  Surgeon: Alwyn Pea, MD;  Location: [facility] INVASIVE CV LAB;  Service: Cardiovascular;  Laterality: N/A;  . DILATION AND CURETTAGE OF UTERUS  X 1  . LEFT HEART CATH AND CORONARY ANGIOGRAPHY N/A 12/28/2019   Procedure: LEFT HEART CATH AND CORONARY ANGIOGRAPHY;  Surgeon: Alwyn Pea, MD;  Location: [facility] INVASIVE CV LAB;  Service: Cardiovascular;  Laterality: N/A;  . 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 TONSILLECTOMY  ~ 1947   2nd grade    Social History   Socioeconomic History  . Marital status: Widowed    Spouse name: Not on file  . Number of children: Not on file  . Years of education: Not on file  . Highest education level: Not on file  Occupational History  . Not on file  Tobacco Use  . Smoking status: Never Smoker  . Smokeless tobacco: Never Used  Substance and Sexual Activity  . Alcohol use: No  . Drug use: No  . Sexual activity: Never  Other Topics Concern  . Not on file  Social History Narrative   Lives at home with one of her sons. Independent at baseline.   Social Determinants of Health   Financial Resource Strain:   . Difficulty of Paying Living Expenses: Not on file  Food Insecurity:   . Worried  About Running Out of Food in the Last Year: Not on file  . Ran Out of Food in the Last Year: Not on file  Transportation Needs:   . Lack of Transportation (Medical): Not on file  . Lack of Transportation (Non-Medical): Not on file  Physical Activity:   . Days of Exercise per Week: Not on file  . Minutes of Exercise per Session: Not on file  Stress:   . Feeling of Stress : Not on file  Social Connections:   . Frequency of Communication with Friends and Family: Not on file  . Frequency of Social Gatherings with Friends and Family: Not on file  . Attends Religious Services: Not on file  . Active Member of Clubs or Organizations: Not on file  . Attends Archivist Meetings:  Not on file  . Marital Status: Not on file  Intimate Partner Violence:   . Fear of Current or Ex-Partner: Not on file  . Emotionally Abused: Not on file  . Physically Abused: Not on file  . Sexually Abused: Not on file    Family History  Problem Relation Age of Onset  . Heart attack Father   . Diabetes Mother   . Cancer Mother   . Stroke Mother   . Breast cancer Cousin   . Breast cancer Cousin   . Breast cancer Cousin      Current Outpatient Medications:  .  aspirin EC 81 MG tablet, Take 81 mg by mouth at bedtime., Disp: , Rfl:  .  atorvastatin (LIPITOR) 40 MG tablet, Take 40 mg by mouth daily., Disp: , Rfl:  .  clopidogrel (PLAVIX) 75 MG tablet, Take 1 tablet (75 mg total) by mouth every morning. (Patient taking differently: Take 75 mg by mouth daily. ), Disp: 30 tablet, Rfl: 2 .  glipiZIDE (GLUCOTROL XL) 5 MG 24 hr tablet, Take 5 mg by mouth daily with breakfast., Disp: , Rfl:  .  lamoTRIgine (LAMICTAL) 100 MG tablet, Take 100 mg by mouth daily., Disp: , Rfl:  .  levETIRAcetam (KEPPRA) 500 MG tablet, Take 500 mg by mouth 2 (two) times daily., Disp: , Rfl:  .  metFORMIN (GLUCOPHAGE) 1000 MG tablet, Take 1 tablet (1,000 mg total) by mouth 2 (two) times daily., Disp: , Rfl:  .  nitroGLYCERIN (NITROSTAT) 0.4 MG SL tablet, Place 0.4 mg under the tongue every 5 (five) minutes as needed for chest pain., Disp: , Rfl:  .  nystatin cream (MYCOSTATIN), Apply 1 application topically 2 (two) times daily as needed (yeast infection). , Disp: , Rfl:  .  SIMBRINZA 1-0.2 % SUSP, Place 1 drop into both eyes 2 (two) times daily. , Disp: , Rfl:  .  timolol (TIMOPTIC) 0.5 % ophthalmic solution, Place 1 drop into both eyes 2 (two) times daily., Disp: , Rfl:  .  triamcinolone ointment (KENALOG) 0.1 %, Apply 1 application topically 3 (three) times daily as needed (itching from psoriasis)., Disp: , Rfl:  .  vitamin B-12 (CYANOCOBALAMIN) 1000 MCG tablet, Take 500 mcg by mouth every morning. , Disp: , Rfl:    .  vitamin E 400 UNIT capsule, Take 400 Units by mouth every morning. , Disp: , Rfl:  .  ascorbic acid (VITAMIN C) 500 MG tablet, Take 1 tablet (500 mg total) by mouth daily. (Patient not taking: Reported on 04/26/2020), Disp: 30 tablet, Rfl: 0 .  folic acid (FOLVITE) 1 MG tablet, Take 1 mg by mouth daily. (Patient not taking: Reported on 04/26/2020), Disp: ,  Rfl:  .  gabapentin (NEURONTIN) 100 MG capsule, Take 100-200 mg by mouth daily as needed. (Patient not taking: Reported on 04/26/2020), Disp: , Rfl:  .  metoprolol succinate (TOPROL-XL) 25 MG 24 hr tablet, Take 1 tablet (25 mg total) by mouth daily. (Patient not taking: Reported on 05/28/2020), Disp: 30 tablet, Rfl: 0 .  midodrine (PROAMATINE) 5 MG tablet, Take 1 tablet (5 mg total) by mouth 2 (two) times daily with a meal. (Patient not taking: Reported on 05/28/2020), Disp: 60 tablet, Rfl: 0 .  ranolazine (RANEXA) 500 MG 12 hr tablet, Take 1 tablet (500 mg total) by mouth 2 (two) times daily., Disp: 60 tablet, Rfl: 1 .  zinc sulfate 220 (50 Zn) MG capsule, Take 1 capsule (220 mg total) by mouth daily. (Patient not taking: Reported on 04/26/2020), Disp: 30 capsule, Rfl: 0  Physical exam:  Vitals:   05/28/20 1440  BP: (!) 170/76  Pulse: 88  Resp: 16  Temp: (!) 96.3 F (35.7 C)  SpO2: 100%  Weight: 117 lb 3.2 oz (53.2 kg)   Physical Exam Constitutional:      Comments: Thin elderly frail woman in no acute distress.  Ambulates with a walker  Cardiovascular:     Rate and Rhythm: Normal rate and regular rhythm.     Heart sounds: Normal heart sounds.  Pulmonary:     Effort: Pulmonary effort is normal.     Breath sounds: Normal breath sounds.  Abdominal:     General: Bowel sounds are normal.     Palpations: Abdomen is soft.  Skin:    General: Skin is warm and dry.  Neurological:     Mental Status: She is alert and oriented to person, place, and time.      CMP Latest Ref Rng & Units 05/08/2020  Glucose 70 - 99 mg/dL 177(H)  BUN 8 - 23  mg/dL 27(H)  Creatinine 0.44 - 1.00 mg/dL 0.90  Sodium 135 - 145 mmol/L 129(L)  Potassium 3.5 - 5.1 mmol/L 4.9  Chloride 98 - 111 mmol/L 97(L)  CO2 22 - 32 mmol/L 22  Calcium 8.9 - 10.3 mg/dL 8.8(L)  Total Protein 6.5 - 8.1 g/dL -  Total Bilirubin 0.3 - 1.2 mg/dL -  Alkaline Phos 38 - 126 U/L -  AST 15 - 41 U/L -  ALT 0 - 44 U/L -   CBC Latest Ref Rng & Units 05/28/2020  WBC 4.0 - 10.5 K/uL 7.4  Hemoglobin 12.0 - 15.0 g/dL 9.7(L)  Hematocrit 36 - 46 % 29.3(L)  Platelets 150 - 400 K/uL 230    No images are attached to the encounter.  CT CHEST WO CONTRAST  Result Date: 05/04/2020 CLINICAL DATA:  Lung nodule follow-up seen on prior imaging in 2019 EXAM: CT CHEST WITHOUT CONTRAST TECHNIQUE: Multidetector CT imaging of the chest was performed following the standard protocol without IV contrast. COMPARISON:  02/14/2018 FINDINGS: Cardiovascular: Calcific atheromatous plaque of the thoracic aorta. Cardiac enlargement, larger than on the prior study though low lung volumes could accentuate cardiac size. Mitral annular calcification. Extensive coronary artery calcification and signs of CABG following median sternotomy with similar appearance. Central pulmonary vasculature mildly engorged, less than 3 cm greatest caliber. Limited assessment of cardiovascular structures given lack of intravenous contrast. Mediastinum/Nodes: No axillary, mediastinal or thoracic inlet lymphadenopathy. Hilar structures without gross nodal enlargement. Lungs/Pleura: Numerous ill-defined ground-glass nodules with upper lobe predominance. No septal thickening. No consolidative change. No pleural effusion.  Midlung is involved bilaterally as well. A 1.3  by 0.8 cm area of amidst ground-glass changes in the RIGHT upper lobe on image 50 of series 4 serves as an example of this process with numerous scattered sub solid areas amidst ground-glass changes. No significant bronchial wall thickening Upper Abdomen: Post cholecystectomy. No  acute process in the upper abdomen. Limited assessment. Musculoskeletal: Spinal degenerative change. No acute or destructive bone finding. IMPRESSION: 1. Numerous ill-defined ground-glass nodules with upper lobe predominance. A 1.3 by 0.8 cm area of amidst ground-glass changes in the RIGHT upper lobe on image 50 of series 4 serves as an example of this process with numerous scattered sub solid areas amidst ground-glass changes. Findings are nonspecific but favor an infectious or inflammatory process. Hypersensitivity pneumonitis could also be considered. Distribution is not classic for COVID-19 pneumonia but this should be excluded as well. Follow-up is suggested to ensure resolution of above findings. 2. Suspected increase in cardiac size with extensive coronary artery disease and signs of prior CABG. No pericardial effusion. 3. Aortic atherosclerosis. Aortic Atherosclerosis (ICD10-I70.0). Electronically Signed   By: Zetta Bills M.D.   On: 05/04/2020 11:58     Assessment and plan- Patient is a 79 y.o. female here for routine follow-up of normocytic anemia  Normocytic anemia: Patient has had an extensive work-up for anemia and peripheral blood work-up did not reveal any reversible etiology.  Patient was recently admitted to the hospital for symptoms of worsening shortness of breath and was found to have hemoglobin of 6.1 with a platelet count of 35 1 04/26/2020.  She therefore underwent a bone marrow biopsy which was fairly unremarkable and was not diagnostic of MDS.  MDS FISH panel was negative as well.  During hospitalization her platelet counts improved on its own without any intervention and even went All the way to 649.  She did receive blood transfusion and her hemoglobin was 10.3 on discharge.  Today her hemoglobin is 9.7 with a normal platelet count of 230.  Because of her sudden anemia is currently unclear.  In the absence of conclusive bone marrow findings this cannot be considered as MDS as such.   She also does not have any baseline CKD to attribute her anemia to that.  At present time inclined to monitor her anemia conservatively with a repeat CBC with differential in 6 weeks and 12 weeks and I will see her back in 12 weeks.   Visit Diagnosis 1. Anemia of chronic disease   2. Normocytic anemia      Dr. Randa Evens, MD, MPH [facility] [phone] 05/31/2020 12:54 PM

## 2020-06-03 ENCOUNTER — Other Ambulatory Visit: Payer: Medicare Other

## 2020-06-04 ENCOUNTER — Ambulatory Visit: Payer: Medicare Other | Admitting: Oncology

## 2020-06-07 ENCOUNTER — Telehealth: Payer: Self-pay | Admitting: Adult Health Nurse Practitioner

## 2020-06-07 NOTE — Telephone Encounter (Signed)
Per family request rescheduled [facility] Palliative visit for 06-25-20 at 3:00.

## 2020-06-11 ENCOUNTER — Other Ambulatory Visit: Payer: Medicare Other | Admitting: Adult Health Nurse Practitioner

## 2020-06-25 ENCOUNTER — Other Ambulatory Visit: Payer: Medicare Other | Admitting: Adult Health Nurse Practitioner

## 2020-06-25 ENCOUNTER — Other Ambulatory Visit: Payer: Self-pay

## 2020-06-25 DIAGNOSIS — E440 Moderate protein-calorie malnutrition: Secondary | ICD-10-CM

## 2020-06-25 DIAGNOSIS — I119 Hypertensive heart disease without heart failure: Secondary | ICD-10-CM

## 2020-06-25 DIAGNOSIS — Z515 Encounter for palliative care: Secondary | ICD-10-CM

## 2020-06-25 NOTE — Progress Notes (Signed)
Therapist, nutritional Palliative Care Consult Note Telephone: [phone]  Fax: [phone]  PATIENT NAME: Leah Small DOB: [birth year] MRN: [id number]  PRIMARY CARE PROVIDER:   Marguarite Arbour, MD  REFERRING PROVIDER:  Marguarite Arbour, MD [address] [facility] [place],  [place] [address]  RESPONSIBLE PARTY:   Self [phone] Leah Small, Leah Small [phone]       RECOMMENDATIONS and PLAN:  1.  Advanced care planning. Patient has MOST form in the home indicating she wants CPR only if witnessed arrest, limited hospital interventions, antibiotics and IV as indicated, and no feeding tube.  Uploaded MOST to [employer]  2. Functional status.  Patient is able to ambulate with without walker short distances in the home.  Uses walker when having to go longer distances.  Is able to perform ADLs and light house cleaning and meal prep independently.  Though has to take frequent rest breaks due to SOB and angina. Continue techniques to conserve energy.   3.  Nutritional status.  Patient does not eat much.  May eat 2 meals a day and an Ensure.  States that she does not have an appetite.  Does not want to take anything like remeron to stimulate appetite.  Weight in May this year was 126.28 with BMI of 22.4.  Was 118 with BMI of 20.9 on 05/22/20 and 113 with BMI of 20.02 on 06/13/20.  Son states that on home scales over the weekend she weighed 109.  Have encouraged to try to eat smaller more frequent meal and/or making milkshakes by blending her Ensure with ice cream.  Discussed that as he body heals and the extra work of her heart that she needs more calories, especially from protein.  Encouraged to weigh herself at least weekly to monitor weight gain/loss.  4. Support.  Patient lives with her son and his girlfriend.  Has another son who comes by frequently to help and will help with bills when needed.  Currently no in home help needed.  Palliative will continue to  monitor for symptom management/decline and make recommendations as needed.  Follow up visit in 5 weeks. Encouraged to call with any questions or concerns.  I spent 60 minutes providing this consultation,  from 3:00 to 4:00 including time spent with patient/family, chart review, provider coordination, documentation. More than 50% of the time in this consultation was spent coordinating communication.   HISTORY OF PRESENT ILLNESS:  Leah Small is a 79 y.o. year old female with multiple medical problems including CAD, HTN, anemia, angina, seizures, DMT2, h/o MI and TIAs. Palliative Care was asked to help address goals of care. Patient had hospitalization 9/3-9/15/21 for NSTEMI.  Was found to have anemia and thrombocytopenia and was given a total of 3 units of PRBC while hospitalized. Patient did test positive for COVID while in the hospital but was asymptomatic. She had short term rehab at [facility].  No longer has PT at home.  Patient states that she has had frequent MI s starting in 1994.  She has chest pain daily due to her angina and gets relief with PRN nitro.  Does have nitro patches that she uses when having to leave home for appointments. She is being followed by cardiology.  No cause was found for her anemia and she had bone marrow tested while in hospital. She is being followed by hematology/oncology. Denies pain, cough, fever, N/V/D, constipation, dysuria, hematuria, hematochezia.    CODE STATUS: full code  PPS: 40% HOSPICE ELIGIBILITY/DIAGNOSIS: TBD  PHYSICAL EXAM:  BP 140/72  HR 90 O2 98% on RA General: NAD, frail appearing, thin Cardiovascular: regular rate and rhythm Pulmonary: lung sounds diminished but clear; normal respiratory effort Abdomen: soft, nontender, + bowel sounds Extremities: no edema, no joint deformities Skin: no rashes on exposed skin Neurological: Weakness but otherwise nonfocal   PAST MEDICAL HISTORY:  Past Medical History:  Diagnosis Date  . Acid reflux    . Arthritis    "all over"  . Chronic stable angina (HCC)   . Coronary artery disease   . Hypercholesterolemia   . Hypertension   . Myocardial infarction (HCC) 1991; 07/06/2015  . NSTEMI (non-ST elevated myocardial infarction) (HCC) 10/22/2015  . Psoriatic arthritis (HCC)   . 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 TIA (transient ischemic attack)    "several over the last 20 years" (10/22/2015)  . Type II diabetes mellitus (HCC)     SOCIAL HX:  Social History   Tobacco Use  . Smoking status: Never Smoker  . Smokeless tobacco: Never Used  Substance Use Topics  . Alcohol use: No    ALLERGIES:  Allergies  Allergen Reactions  . Adhesive [Tape] Other (See Comments)    Bruising and TEARING!!!  Please use "paper" tape  . Talwin [Pentazocine] Anaphylaxis and Swelling    Throat swelling  . Valium [Diazepam] Other (See Comments)    Makes her feel like she's flying  . Vicodin [Hydrocodone-Acetaminophen] Nausea And Vomiting  . Beta Adrenergic Blockers Other (See Comments)    Pt states she not allergic  . Levofloxacin Swelling and Other (See Comments)    Tongue swells   . Simvastatin Swelling and Rash    Mouth swelling     PERTINENT MEDICATIONS:  Outpatient Encounter Medications as of 06/25/2020  Medication Sig  . ascorbic acid (VITAMIN C) 500 MG tablet Take 1 tablet (500 mg total) by mouth daily. (Patient not taking: Reported on 04/26/2020)  . aspirin EC 81 MG tablet Take 81 mg by mouth at bedtime.  [name] atorvastatin (LIPITOR) 40 MG tablet Take 40 mg by mouth daily.  . clopidogrel (PLAVIX) 75 MG tablet Take 1 tablet (75 mg total) by mouth every morning. (Patient taking differently: Take 75 mg by mouth daily. )  . folic acid (FOLVITE) 1 MG tablet Take 1 mg by mouth daily. (Patient not taking: Reported on 04/26/2020)  . gabapentin (NEURONTIN) 100 MG capsule Take 100-200 mg by mouth daily as needed. (Patient not taking: Reported  on 04/26/2020)  . glipiZIDE (GLUCOTROL XL) 5 MG 24 hr tablet Take 5 mg by mouth daily with breakfast.  . lamoTRIgine (LAMICTAL) 100 MG tablet Take 100 mg by mouth daily.  [name] levETIRAcetam (KEPPRA) 500 MG tablet Take 500 mg by mouth 2 (two) times daily.  . metFORMIN (GLUCOPHAGE) 1000 MG tablet Take 1 tablet (1,000 mg total) by mouth 2 (two) times daily.  . metoprolol succinate (TOPROL-XL) 25 MG 24 hr tablet Take 1 tablet (25 mg total) by mouth daily. (Patient not taking: Reported on 05/28/2020)  . nitroGLYCERIN (NITROSTAT) 0.4 MG SL tablet Place 0.4 mg under the tongue every 5 (five) minutes as needed for chest pain.  [name] nystatin cream (MYCOSTATIN) Apply 1 application topically 2 (two) times daily as needed (yeast infection).   . ranolazine (RANEXA) 500 MG 12 hr tablet Take 1 tablet (500 mg total) by mouth 2 (two) times daily.  [name] [facility]  1-0.2 % SUSP Place 1 drop into both eyes 2 (two) times daily.   . timolol (TIMOPTIC) 0.5 % ophthalmic solution Place 1 drop into both eyes 2 (two) times daily.  [name] triamcinolone ointment (KENALOG) 0.1 % Apply 1 application topically 3 (three) times daily as needed (itching from psoriasis).  . vitamin B-12 (CYANOCOBALAMIN) 1000 MCG tablet Take 500 mcg by mouth every morning.   . vitamin E 400 UNIT capsule Take 400 Units by mouth every morning.   . zinc sulfate 220 (50 Zn) MG capsule Take 1 capsule (220 mg total) by mouth daily. (Patient not taking: Reported on 04/26/2020)   No facility-administered encounter medications on file as of 06/25/2020.     Vidur Knust Marlena Clipper, NP

## 2020-07-09 ENCOUNTER — Inpatient Hospital Stay: Payer: Medicare Other | Attending: Oncology

## 2020-07-09 DIAGNOSIS — D638 Anemia in other chronic diseases classified elsewhere: Secondary | ICD-10-CM | POA: Insufficient documentation

## 2020-07-09 LAB — CBC WITH DIFFERENTIAL/PLATELET
Abs Immature Granulocytes: 0.02 10*3/uL (ref 0.00–0.07)
Basophils Absolute: 0 10*3/uL (ref 0.0–0.1)
Basophils Relative: 1 %
Eosinophils Absolute: 0.1 10*3/uL (ref 0.0–0.5)
Eosinophils Relative: 1 %
HCT: 27.9 % — ABNORMAL LOW (ref 36.0–46.0)
Hemoglobin: 9.4 g/dL — ABNORMAL LOW (ref 12.0–15.0)
Immature Granulocytes: 0 %
Lymphocytes Relative: 24 %
Lymphs Abs: 1.3 10*3/uL (ref 0.7–4.0)
MCH: 34.9 pg — ABNORMAL HIGH (ref 26.0–34.0)
MCHC: 33.7 g/dL (ref 30.0–36.0)
MCV: 103.7 fL — ABNORMAL HIGH (ref 80.0–100.0)
Monocytes Absolute: 0.5 10*3/uL (ref 0.1–1.0)
Monocytes Relative: 10 %
Neutro Abs: 3.5 10*3/uL (ref 1.7–7.7)
Neutrophils Relative %: 64 %
Platelets: 183 10*3/uL (ref 150–400)
RBC: 2.69 MIL/uL — ABNORMAL LOW (ref 3.87–5.11)
RDW: 16.1 % — ABNORMAL HIGH (ref 11.5–15.5)
WBC: 5.4 10*3/uL (ref 4.0–10.5)
nRBC: 0 % (ref 0.0–0.2)

## 2020-07-30 ENCOUNTER — Other Ambulatory Visit: Payer: Medicare Other | Admitting: Adult Health Nurse Practitioner

## 2020-07-30 ENCOUNTER — Other Ambulatory Visit: Payer: Self-pay

## 2020-07-30 DIAGNOSIS — E440 Moderate protein-calorie malnutrition: Secondary | ICD-10-CM

## 2020-07-30 DIAGNOSIS — Z515 Encounter for palliative care: Secondary | ICD-10-CM

## 2020-07-30 DIAGNOSIS — J00 Acute nasopharyngitis [common cold]: Secondary | ICD-10-CM

## 2020-07-30 DIAGNOSIS — I209 Angina pectoris, unspecified: Secondary | ICD-10-CM

## 2020-07-30 NOTE — Progress Notes (Signed)
Therapist, nutritional Palliative Care Consult Note Telephone: [phone]  Fax: [phone]  PATIENT NAME: Leah Small DOB: 03-Dec-1940 MRN: [id number]  PRIMARY CARE PROVIDER:   Marguarite Arbour, MD  REFERRING PROVIDER:  Marguarite Arbour, MD [address] [facility] [place],  [place] [address]  RESPONSIBLE PARTY:   Self [phone] [name], [name] [phone]     Chief complaint:  Follow up palliative visit/angina   RECOMMENDATIONS and PLAN:  1.  Advanced care planning. Patient has MOST form in the home indicating she wants CPR only if witnessed arrest, limited hospital interventions, antibiotics and IV as indicated, and no feeding tube.  2.  PCM.  Patient still having poor appetite.  Tries to supplement with Ensure.  Have encouraged small more frequent meals.    3.  Angina.  Patient having to use her PRN nitro more often and has had to use a nitro patch one day.  She usually uses the nitro patches only when she has to go out to an appointment.  Have encouraged an afternoon nap and had discussion on balancing activity and rest breaks.   4.  Acute rhinitis.  Symptoms relieved with OTC medication.  Continue conservative symptom management.  Encouraged to call with any worsening symptoms.  Palliative will continue to monitor for symptom management/decline and make recommendations as needed.  Follow up visit in 8 weeks. Encouraged to call with any questions or concerns.  I spent 40 minutes providing this consultation. More than 50% of the time in this consultation was spent coordinating communication.   HISTORY OF PRESENT ILLNESS:  Leah Small is a 79 y.o. year old female with multiple medical problems including CAD, HTN, anemia, angina, seizures, DMT2, h/o MI and TIAs. Palliative Care was asked to help address goals of care.  Patient still having poor appetite.  Tried making milkshakes with ice cream and Ensure and enjoyed these until she  ran out of ice cream.  Still tries to encourage herself to eat even when not hungry.  Does state that she gets full quickly.   Patient has chronic angina and states that she has been increasing her activity over the past 3 months.  States that 3 months ago she couldn't wash dishes, sweep, or take a walk 4x in the house.  Now she has been able to do these thing.  Does state that she has been having more episodes of angina in the evenings and having to use her nitro more often.  Discussed that the increased activity is causing her heart to work harder.   She has had a head cold for a couple of days.  Has had some sinus congestion and tenderness with a productive cough.  Denies fever, N/V/D, constipation.  Gets relief with Tylenol sinus.    CODE STATUS: full code  PPS: 50% HOSPICE ELIGIBILITY/DIAGNOSIS: TBD  PHYSICAL EXAM:  BP 142/70  HR 91  O2 97% on RA General: NAD, frail appearing, thin ENMT: moist mucous membranes Cardiovascular: regular rate and rhythm Pulmonary: lung sounds diminished but clear; normal respiratory effort Abdomen: soft, nontender, + bowel sounds Extremities: no edema, no joint deformities Skin: no rashes on exposed skin Neurological: Weakness but otherwise nonfocal  PAST MEDICAL HISTORY:  Past Medical History:  Diagnosis Date  . Acid reflux   . Arthritis    "all over"  . Chronic stable angina (HCC)   . Coronary artery disease   . Hypercholesterolemia   . Hypertension   .  Myocardial infarction (HCC) 1991; 07/06/2015  . NSTEMI (non-ST elevated myocardial infarction) (HCC) 10/22/2015  . Psoriatic arthritis (HCC)   . 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 TIA (transient ischemic attack)    "several over the last 20 years" (10/22/2015)  . Type II diabetes mellitus (HCC)     SOCIAL HX:  Social History   Tobacco Use  . Smoking status: Never Smoker  . Smokeless tobacco: Never Used  Substance Use Topics   . Alcohol use: No    ALLERGIES:  Allergies  Allergen Reactions  . Adhesive [Tape] Other (See Comments)    Bruising and TEARING!!!  Please use "paper" tape  . Talwin [Pentazocine] Anaphylaxis and Swelling    Throat swelling  . Valium [Diazepam] Other (See Comments)    Makes her feel like she's flying  . Vicodin [Hydrocodone-Acetaminophen] Nausea And Vomiting  . Beta Adrenergic Blockers Other (See Comments)    Pt states she not allergic  . Levofloxacin Swelling and Other (See Comments)    Tongue swells   . Simvastatin Swelling and Rash    Mouth swelling     PERTINENT MEDICATIONS:  Outpatient Encounter Medications as of 07/30/2020  Medication Sig  . ascorbic acid (VITAMIN C) 500 MG tablet Take 1 tablet (500 mg total) by mouth daily. (Patient not taking: Reported on 04/26/2020)  . aspirin EC 81 MG tablet Take 81 mg by mouth at bedtime.  [name] atorvastatin (LIPITOR) 40 MG tablet Take 40 mg by mouth daily.  . clopidogrel (PLAVIX) 75 MG tablet Take 1 tablet (75 mg total) by mouth every morning. (Patient taking differently: Take 75 mg by mouth daily. )  . folic acid (FOLVITE) 1 MG tablet Take 1 mg by mouth daily. (Patient not taking: Reported on 04/26/2020)  . gabapentin (NEURONTIN) 100 MG capsule Take 100-200 mg by mouth daily as needed. (Patient not taking: Reported on 04/26/2020)  . glipiZIDE (GLUCOTROL XL) 5 MG 24 hr tablet Take 5 mg by mouth daily with breakfast.  . lamoTRIgine (LAMICTAL) 100 MG tablet Take 100 mg by mouth daily.  [name] levETIRAcetam (KEPPRA) 500 MG tablet Take 500 mg by mouth 2 (two) times daily.  . metFORMIN (GLUCOPHAGE) 1000 MG tablet Take 1 tablet (1,000 mg total) by mouth 2 (two) times daily.  . metoprolol succinate (TOPROL-XL) 25 MG 24 hr tablet Take 1 tablet (25 mg total) by mouth daily. (Patient not taking: Reported on 05/28/2020)  . nitroGLYCERIN (NITROSTAT) 0.4 MG SL tablet Place 0.4 mg under the tongue every 5 (five) minutes as needed for chest pain.  [name] nystatin cream  (MYCOSTATIN) Apply 1 application topically 2 (two) times daily as needed (yeast infection).   . ranolazine (RANEXA) 500 MG 12 hr tablet Take 1 tablet (500 mg total) by mouth 2 (two) times daily.  [name] SIMBRINZA 1-0.2 % SUSP Place 1 drop into both eyes 2 (two) times daily.   . timolol (TIMOPTIC) 0.5 % ophthalmic solution Place 1 drop into both eyes 2 (two) times daily.  [name] triamcinolone ointment (KENALOG) 0.1 % Apply 1 application topically 3 (three) times daily as needed (itching from psoriasis).  . vitamin B-12 (CYANOCOBALAMIN) 1000 MCG tablet Take 500 mcg by mouth every morning.   . vitamin E 400 UNIT capsule Take 400 Units by mouth every morning.   . zinc sulfate 220 (50 Zn) MG capsule Take 1 capsule (220 mg total) by mouth daily. (Patient not taking: Reported on 04/26/2020)  No facility-administered encounter medications on file as of 07/30/2020.    Maeson Purohit Marlena Clipper, NP

## 2020-08-03 ENCOUNTER — Encounter: Payer: Self-pay | Admitting: Oncology

## 2020-08-05 ENCOUNTER — Other Ambulatory Visit: Payer: Self-pay | Admitting: *Deleted

## 2020-08-05 ENCOUNTER — Other Ambulatory Visit: Payer: Self-pay

## 2020-08-05 ENCOUNTER — Inpatient Hospital Stay: Payer: Medicare Other | Attending: Oncology

## 2020-08-05 ENCOUNTER — Other Ambulatory Visit: Payer: Self-pay | Admitting: Oncology

## 2020-08-05 DIAGNOSIS — D509 Iron deficiency anemia, unspecified: Secondary | ICD-10-CM | POA: Diagnosis present

## 2020-08-05 DIAGNOSIS — Z79899 Other long term (current) drug therapy: Secondary | ICD-10-CM | POA: Diagnosis not present

## 2020-08-05 DIAGNOSIS — D638 Anemia in other chronic diseases classified elsewhere: Secondary | ICD-10-CM

## 2020-08-05 DIAGNOSIS — Z8249 Family history of ischemic heart disease and other diseases of the circulatory system: Secondary | ICD-10-CM | POA: Diagnosis not present

## 2020-08-05 DIAGNOSIS — Z809 Family history of malignant neoplasm, unspecified: Secondary | ICD-10-CM | POA: Insufficient documentation

## 2020-08-05 DIAGNOSIS — D649 Anemia, unspecified: Secondary | ICD-10-CM

## 2020-08-05 DIAGNOSIS — Z803 Family history of malignant neoplasm of breast: Secondary | ICD-10-CM | POA: Insufficient documentation

## 2020-08-05 DIAGNOSIS — R5383 Other fatigue: Secondary | ICD-10-CM | POA: Insufficient documentation

## 2020-08-05 DIAGNOSIS — Z833 Family history of diabetes mellitus: Secondary | ICD-10-CM | POA: Insufficient documentation

## 2020-08-05 DIAGNOSIS — Z823 Family history of stroke: Secondary | ICD-10-CM | POA: Diagnosis not present

## 2020-08-05 LAB — IRON AND TIBC
Iron: 289 ug/dL — ABNORMAL HIGH (ref 28–170)
Saturation Ratios: 89 % — ABNORMAL HIGH (ref 10.4–31.8)
TIBC: 326 ug/dL (ref 250–450)
UIBC: 37 ug/dL

## 2020-08-05 LAB — CBC
HCT: 18.1 % — ABNORMAL LOW (ref 36.0–46.0)
Hemoglobin: 6.1 g/dL — ABNORMAL LOW (ref 12.0–15.0)
MCH: 35.9 pg — ABNORMAL HIGH (ref 26.0–34.0)
MCHC: 33.7 g/dL (ref 30.0–36.0)
MCV: 106.5 fL — ABNORMAL HIGH (ref 80.0–100.0)
Platelets: 38 10*3/uL — ABNORMAL LOW (ref 150–400)
RBC: 1.7 MIL/uL — ABNORMAL LOW (ref 3.87–5.11)
RDW: 16.8 % — ABNORMAL HIGH (ref 11.5–15.5)
WBC: 2.3 10*3/uL — ABNORMAL LOW (ref 4.0–10.5)
nRBC: 2.6 % — ABNORMAL HIGH (ref 0.0–0.2)

## 2020-08-05 LAB — FOLATE: Folate: >100 ng/mL (ref >5.9)

## 2020-08-05 LAB — SAMPLE TO BLOOD BANK

## 2020-08-05 LAB — VITAMIN B12: Vitamin B-12: 310 pg/mL (ref 180–914)

## 2020-08-05 NOTE — Telephone Encounter (Signed)
yes

## 2020-08-06 ENCOUNTER — Telehealth: Payer: Self-pay | Admitting: *Deleted

## 2020-08-06 ENCOUNTER — Other Ambulatory Visit: Payer: Self-pay | Admitting: *Deleted

## 2020-08-06 ENCOUNTER — Inpatient Hospital Stay: Payer: Medicare Other

## 2020-08-06 ENCOUNTER — Other Ambulatory Visit: Payer: Self-pay

## 2020-08-06 DIAGNOSIS — D649 Anemia, unspecified: Secondary | ICD-10-CM

## 2020-08-06 DIAGNOSIS — D509 Iron deficiency anemia, unspecified: Secondary | ICD-10-CM | POA: Diagnosis not present

## 2020-08-06 LAB — PREPARE RBC (CROSSMATCH)

## 2020-08-06 MED ORDER — FUROSEMIDE 10 MG/ML IJ SOLN
20.0000 mg | Freq: Once | INTRAMUSCULAR | Status: AC
  Administered 2020-08-06: 16:00:00 20 mg via INTRAVENOUS
  Filled 2020-08-06: qty 2

## 2020-08-06 MED ORDER — PROCHLORPERAZINE EDISYLATE 10 MG/2ML IJ SOLN
10.0000 mg | Freq: Once | INTRAMUSCULAR | Status: AC
  Administered 2020-08-06: 14:00:00 10 mg via INTRAVENOUS
  Filled 2020-08-06: qty 2

## 2020-08-06 MED ORDER — SODIUM CHLORIDE 0.9% IV SOLUTION
250.0000 mL | Freq: Once | INTRAVENOUS | Status: AC
  Administered 2020-08-06: 11:00:00 250 mL via INTRAVENOUS
  Filled 2020-08-06: qty 250

## 2020-08-06 MED ORDER — ACETAMINOPHEN 325 MG PO TABS
650.0000 mg | ORAL_TABLET | Freq: Once | ORAL | Status: AC
  Administered 2020-08-06: 11:00:00 650 mg via ORAL
  Filled 2020-08-06: qty 2

## 2020-08-06 NOTE — Telephone Encounter (Signed)
Called Dr. Gwen Pounds to ask if it is ok to give lasix 20 mg IV for the pt. She is in clinic getting 2 units of blood and having some nausea and  SOB and gave oxygen and it got better but with her heart and age and need for 2 units and it will be 700 ml. Dr. Gwen Pounds said it is fine to give the lasix due to the amount of fluids  she is getting. Called and spoke to son to look out for his mom because of nausea and SoB over in infusion and dr Smith Robert gave her lasix and will will go to the bathroom a lot. If she has chest pain or SOB then he would need to take her to ER. Because she had the issue here, dr Smith Robert wants to check her labs next week also. Son said he will bring her 12/22 8:45. appt made.

## 2020-08-06 NOTE — Progress Notes (Signed)
Prior to blood transfusion pt stated that "her cardiologist suggested not to take any lasix due to her heart condition." MD made aware, verbal order to hold lasix at this time.   Pt tolerated first unit well with no complications. After first unit of RBCs were completed, pt expressed that she was feeling a lot of nausea after she ate the lunch that was provided to her. RN notified MD. Per MD to give pt Compazine 10 mg IV at this time prior to second unit of RBC and if pt feels better to continue with second unit of RBCs. Compazine 10mg  IV given to pt at 1330. VSS. Pt reassessed at 1355 after Compazine administration and she stated that the nausea was gone.   Second unit of RBCs started at 1403. At 1440, pt began to express that she was having one of her SOB episodes. Pt also expressed that she has been having these episodes for two weeks now. Pt does not appear to be in distress. VSS. O2- 92% on RA, MD notified. MD at chairside to assess pt. Per MD to slow blood transfusion rate to transfusion the rest of the unit between a hour and half to two hours. Per MD, her team will contact pt.'s cardiologist to address the need of lasix after blood transfusion. Per MD and treatment team they will call and update the pt.'s son. Per MD at 1515, verbal order to give 20 mg IV lasix after the blood transfusion is completed, MD consulted with pt.'s cardiologist. Pt completed the rest of the transfusion well with no complications. Second unit of RBC completed at 1414. Lasix given at 1627. VSS at discharge. Pt expressed that she feels better and back to her baseline. RN educated pt on the importance of when to seek emergency care, pt verbalized understanding and all questions answered at this time. Nikolis Berent, NP at chairside to assess pt prior to discharge. Pt stable for discharge. RN escorted pt out by wheelchair to be driven home by son.   Chairty Toman 

## 2020-08-07 LAB — BPAM RBC
Blood Product Expiration Date: 202112262359
Blood Product Expiration Date: 202112292359
ISSUE DATE / TIME: 202112141118
ISSUE DATE / TIME: 202112141356
Unit Type and Rh: 600
Unit Type and Rh: 600

## 2020-08-07 LAB — TYPE AND SCREEN
ABO/RH(D): A POS
Antibody Screen: NEGATIVE
Unit division: 0
Unit division: 0

## 2020-08-14 ENCOUNTER — Inpatient Hospital Stay: Payer: Medicare Other

## 2020-08-14 DIAGNOSIS — D509 Iron deficiency anemia, unspecified: Secondary | ICD-10-CM | POA: Diagnosis not present

## 2020-08-14 DIAGNOSIS — D638 Anemia in other chronic diseases classified elsewhere: Secondary | ICD-10-CM

## 2020-08-14 DIAGNOSIS — D649 Anemia, unspecified: Secondary | ICD-10-CM

## 2020-08-14 LAB — SAMPLE TO BLOOD BANK

## 2020-08-14 LAB — CBC
HCT: 27.3 % — ABNORMAL LOW (ref 36.0–46.0)
Hemoglobin: 9.1 g/dL — ABNORMAL LOW (ref 12.0–15.0)
MCH: 35 pg — ABNORMAL HIGH (ref 26.0–34.0)
MCHC: 33.3 g/dL (ref 30.0–36.0)
MCV: 105 fL — ABNORMAL HIGH (ref 80.0–100.0)
Platelets: 217 10*3/uL (ref 150–400)
RBC: 2.6 MIL/uL — ABNORMAL LOW (ref 3.87–5.11)
RDW: 23.5 % — ABNORMAL HIGH (ref 11.5–15.5)
WBC: 3.3 10*3/uL — ABNORMAL LOW (ref 4.0–10.5)
nRBC: 0.6 % — ABNORMAL HIGH (ref 0.0–0.2)

## 2020-08-14 NOTE — Progress Notes (Signed)
Hemoglobin 9.1. per Dr. Smith Robert no need for blood transfusion, pt okay to discharge. Pt aware. Pt stable at discharge.

## 2020-08-20 ENCOUNTER — Inpatient Hospital Stay: Payer: Medicare Other

## 2020-08-20 ENCOUNTER — Inpatient Hospital Stay: Payer: Medicare Other | Admitting: Oncology

## 2020-08-20 ENCOUNTER — Telehealth: Payer: Self-pay | Admitting: *Deleted

## 2020-08-20 NOTE — Telephone Encounter (Signed)
Per pt's son called today to cancel the appt for today. He states that pt. Has gi virus. His brother and his girlfriend had got it 4-6 days ago and they live with her. Made an appt 08/29/2020 at 1:45 labs and 2 pm to see md. Son is agreeable to this and if she is not back to herself then he will call and let us know if we need to reschedule it again or not.

## 2020-08-20 NOTE — Progress Notes (Deleted)
Hematology/Oncology Consult note [facility]  [phone] [phone]  Patient Care Team: Idelle Crouch, MD as PCP - General (Internal Medicine) Sindy Guadeloupe, MD as Consulting Physician (Oncology)   Name of the patient: Leah Small  [id number]  June 11, 1941   Date of visit: 08/20/20  Diagnosis- anemia multifactorial likely secondary to chronic disease as well as iron deficiency  Chief complaint/ Reason for visit-routine follow-up of anemia  Heme/Onc history: Patient is a79 year old female with a past medical history significant for coronary artery disease, CVA, CABG, hypertension hyperlipidemia diabetes GERD among other medical problems. She was recently seen by cardiology for fatigue and palpitations. She has been referred to hematology for anemia. Most recent CBC from 12/04/2019 showed white cell count of 5.7, H&H of 7.5/23.3 and a platelet count of 162. MCV was 99. Looking back at her prior CBCs patient has had chronic anemia with a hemoglobin which was around 10 up until January 2021 after which it dropped to 7.8. CMP showed normal liver and kidney functions. Hepatitis B and C testing has been negative. TSH in January was normal.  Results of blood work from 12/18/2019 were as follows: CBC showed white count of 7.4, H&H of 8.5/27.8 and a platelet count of 537. CMP was significant for mildly elevated creatinine of 1.1. B12 levels were elevated. Folate was normal. TSH was normal. Haptoglobin was normal. Myeloma panel showed no M protein. B1and B6 levels were normal.Coombs test was negative. EPO levels were elevated at 54. LDH was normal. Iron study showed a low ferritin of 11 with elevated TIBC of 545.  Patient was admitted following to the hospital following that visit and was found to have Covid pneumonia.She presented with chest pain but during cardiac cath had a complications for which the procedure was aborted and  medical management was recommended. She was given 1 unit of blood transfusion while she was in the hospital when her Hemoglobin dropped to 7. She also received IV iron.  Bone marrow biopsy on 05/01/2020 showed hypercellular marrow with mild trilineage dysplasia.  Monocytes mildly increased but appear mature.  No increase in blasts.  Changes alone are not diagnostic of MDS.  Flow cytometry on the aspirate was unremarkable.  Normal female karyotype.  FISH for MDS was negative.   Interval history- ***  ECOG PS- *** Pain scale- *** Opioid associated constipation- ***  Review of systems- Review of Systems  Constitutional: Negative for chills, fever, malaise/fatigue and weight loss.  HENT: Negative for congestion, ear discharge and nosebleeds.   Eyes: Negative for blurred vision.  Respiratory: Negative for cough, hemoptysis, sputum production, shortness of breath and wheezing.   Cardiovascular: Negative for chest pain, palpitations, orthopnea and claudication.  Gastrointestinal: Negative for abdominal pain, blood in stool, constipation, diarrhea, heartburn, melena, nausea and vomiting.  Genitourinary: Negative for dysuria, flank pain, frequency, hematuria and urgency.  Musculoskeletal: Negative for back pain, joint pain and myalgias.  Skin: Negative for rash.  Neurological: Negative for dizziness, tingling, focal weakness, seizures, weakness and headaches.  Endo/Heme/Allergies: Does not bruise/bleed easily.  Psychiatric/Behavioral: Negative for depression and suicidal ideas. The patient does not have insomnia.       Allergies  Allergen Reactions  . Adhesive [Tape] Other (See Comments)    Bruising and TEARING!!!  Please use "paper" tape  . Talwin [Pentazocine] Anaphylaxis and Swelling    Throat swelling  . Valium [Diazepam] Other (See Comments)    Makes her feel like she's flying  . Vicodin [  Hydrocodone-Acetaminophen] Nausea And Vomiting  . Beta Adrenergic Blockers Other (See Comments)     Pt states she not allergic  . Levofloxacin Swelling and Other (See Comments)    Tongue swells   . Simvastatin Swelling and Rash    Mouth swelling     Past Medical History:  Diagnosis Date  . Acid reflux   . Arthritis    "all over"  . Chronic stable angina ([place])   . Coronary artery disease   . Hypercholesterolemia   . Hypertension   . Myocardial infarction ([place]) 1991; 07/06/2015  . NSTEMI (non-ST elevated myocardial infarction) ([place]) 10/22/2015  . Psoriatic arthritis ([place])   . 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 TIA (transient ischemic attack)    "several over the last 20 years" (10/22/2015)  . Type II diabetes mellitus ([place])      Past Surgical History:  Procedure Laterality Date  . ABDOMINAL HYSTERECTOMY  1973  . APPENDECTOMY  1950s   elementary school  . CARDIAC CATHETERIZATION  X 2-3  . CARDIAC CATHETERIZATION N/A 11/21/2015   Procedure: Left Heart Cath and Coronary Angiography;  Surgeon: Jettie Booze, MD;  Location: [facility];  Service: Cardiovascular;  Laterality: N/A;  . CARDIAC CATHETERIZATION  11/21/2015   Procedure: Coronary Stent Intervention;  Surgeon: Jettie Booze, MD;  Location: [facility] [facility];  Service: Cardiovascular;;  . CARDIAC CATHETERIZATION N/A 01/29/2016   Procedure: Left Heart Cath and Cors/Grafts Angiography;  Surgeon: Sherren Mocha, MD;  Location: [facility];  Service: Cardiovascular;  Laterality: N/A;  . CARDIAC CATHETERIZATION N/A 07/22/2016   Procedure: Left Heart Cath and Coronary Angiography;  Surgeon: Wellington Hampshire, MD;  Location: [place] [facility];  Service: Cardiovascular;  Laterality: N/A;  . CAROTID STENT INSERTION Bilateral 2011-2012   right-left  . CATARACT EXTRACTION W/ INTRAOCULAR LENS  IMPLANT, BILATERAL Bilateral 2009-2010  . CHOLECYSTECTOMY OPEN  ~ 2014  . CORONARY ANGIOPLASTY    . CORONARY ANGIOPLASTY WITH STENT PLACEMENT   11/21/2015  . CORONARY ARTERY BYPASS GRAFT  08/1995  . CORONARY/GRAFT ACUTE MI REVASCULARIZATION N/A 12/28/2019   Procedure: Coronary/Graft Acute MI Revascularization;  Surgeon: Yolonda Kida, MD;  Location: [facility];  Service: Cardiovascular;  Laterality: N/A;  . DILATION AND CURETTAGE OF UTERUS  X 1  . LEFT HEART CATH AND CORONARY ANGIOGRAPHY N/A 12/28/2019   Procedure: LEFT HEART CATH AND CORONARY ANGIOGRAPHY;  Surgeon: Yolonda Kida, MD;  Location: [place] [facility];  Service: Cardiovascular;  Laterality: N/A;  . 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 TONSILLECTOMY  ~ 1947   2nd grade    Social History   Socioeconomic History  . Marital status: Widowed    Spouse name: Not on file  . Number of children: Not on file  . Years of education: Not on file  . Highest education level: Not on file  Occupational History  . Not on file  Tobacco Use  . Smoking status: Never Smoker  . Smokeless tobacco: Never Used  Substance and Sexual Activity  . Alcohol use: No  . Drug use: No  . Sexual activity: Never  Other Topics Concern  . Not on file  Social History Narrative   Lives at home with one of her sons. Independent at baseline.   Social Determinants of Health   Financial Resource Strain: Not on file  Food Insecurity: Not on file  Transportation Needs: Not on file  Physical Activity: Not on file  Stress: Not on file  Social Connections: Not on file  Intimate Partner Violence: Not on file    Family History  Problem Relation Age of Onset  . Heart attack Father   . Diabetes Mother   . Cancer Mother   . Stroke Mother   . Breast cancer Cousin   . Breast cancer Cousin   . Breast cancer Cousin      Current Outpatient Medications:  .  ascorbic acid (VITAMIN C) 500 MG tablet, Take 1 tablet (500 mg total) by mouth daily. (Patient not taking: Reported on 04/26/2020), Disp: 30 tablet, Rfl: 0 .  aspirin EC 81  MG tablet, Take 81 mg by mouth at bedtime., Disp: , Rfl:  .  atorvastatin (LIPITOR) 40 MG tablet, Take 40 mg by mouth daily., Disp: , Rfl:  .  clopidogrel (PLAVIX) 75 MG tablet, Take 1 tablet (75 mg total) by mouth every morning. (Patient taking differently: Take 75 mg by mouth daily. ), Disp: 30 tablet, Rfl: 2 .  folic acid (FOLVITE) 1 MG tablet, Take 1 mg by mouth daily. (Patient not taking: Reported on 04/26/2020), Disp: , Rfl:  .  gabapentin (NEURONTIN) 100 MG capsule, Take 100-200 mg by mouth daily as needed. (Patient not taking: Reported on 04/26/2020), Disp: , Rfl:  .  glipiZIDE (GLUCOTROL XL) 5 MG 24 hr tablet, Take 5 mg by mouth daily with breakfast., Disp: , Rfl:  .  lamoTRIgine (LAMICTAL) 100 MG tablet, Take 100 mg by mouth daily., Disp: , Rfl:  .  levETIRAcetam (KEPPRA) 500 MG tablet, Take 500 mg by mouth 2 (two) times daily., Disp: , Rfl:  .  metFORMIN (GLUCOPHAGE) 1000 MG tablet, Take 1 tablet (1,000 mg total) by mouth 2 (two) times daily., Disp: , Rfl:  .  metoprolol succinate (TOPROL-XL) 25 MG 24 hr tablet, Take 1 tablet (25 mg total) by mouth daily. (Patient not taking: Reported on 05/28/2020), Disp: 30 tablet, Rfl: 0 .  nitroGLYCERIN (NITROSTAT) 0.4 MG SL tablet, Place 0.4 mg under the tongue every 5 (five) minutes as needed for chest pain., Disp: , Rfl:  .  nystatin cream (MYCOSTATIN), Apply 1 application topically 2 (two) times daily as needed (yeast infection). , Disp: , Rfl:  .  ranolazine (RANEXA) 500 MG 12 hr tablet, Take 1 tablet (500 mg total) by mouth 2 (two) times daily., Disp: 60 tablet, Rfl: 1 .  SIMBRINZA 1-0.2 % SUSP, Place 1 drop into both eyes 2 (two) times daily. , Disp: , Rfl:  .  timolol (TIMOPTIC) 0.5 % ophthalmic solution, Place 1 drop into both eyes 2 (two) times daily., Disp: , Rfl:  .  triamcinolone ointment (KENALOG) 0.1 %, Apply 1 application topically 3 (three) times daily as needed (itching from psoriasis)., Disp: , Rfl:  .  vitamin B-12 (CYANOCOBALAMIN) 1000  MCG tablet, Take 500 mcg by mouth every morning. , Disp: , Rfl:  .  vitamin E 400 UNIT capsule, Take 400 Units by mouth every morning. , Disp: , Rfl:  .  zinc sulfate 220 (50 Zn) MG capsule, Take 1 capsule (220 mg total) by mouth daily. (Patient not taking: Reported on 04/26/2020), Disp: 30 capsule, Rfl: 0  Physical exam: There were no vitals filed for this visit. Physical Exam HENT:     Head: Normocephalic and atraumatic.  Eyes:     Extraocular Movements: EOM normal.     Pupils: Pupils  are equal, round, and reactive to light.  Cardiovascular:     Rate and Rhythm: Normal rate and regular rhythm.     Heart sounds: Normal heart sounds.  Pulmonary:     Effort: Pulmonary effort is normal.     Breath sounds: Normal breath sounds.  Abdominal:     General: Bowel sounds are normal.     Palpations: Abdomen is soft.  Musculoskeletal:     Cervical back: Normal range of motion.  Skin:    General: Skin is warm and dry.  Neurological:     Mental Status: She is alert and oriented to person, place, and time.      CMP Latest Ref Rng & Units 05/08/2020  Glucose 70 - 99 mg/dL 177(H)  BUN 8 - 23 mg/dL 27(H)  Creatinine 0.44 - 1.00 mg/dL 0.90  Sodium 135 - 145 mmol/L 129(L)  Potassium 3.5 - 5.1 mmol/L 4.9  Chloride 98 - 111 mmol/L 97(L)  CO2 22 - 32 mmol/L 22  Calcium 8.9 - 10.3 mg/dL 8.8(L)  Total Protein 6.5 - 8.1 g/dL -  Total Bilirubin 0.3 - 1.2 mg/dL -  Alkaline Phos 38 - 126 U/L -  AST 15 - 41 U/L -  ALT 0 - 44 U/L -   CBC Latest Ref Rng & Units 08/14/2020  WBC 4.0 - 10.5 K/uL 3.3(L)  Hemoglobin 12.0 - 15.0 g/dL 9.1(L)  Hematocrit 36.0 - 46.0 % 27.3(L)  Platelets 150 - 400 K/uL 217    No images are attached to the encounter.  No results found.   Assessment and plan- Patient is a 79 y.o. female with normocytic anemia And thrombocytopenia of unclear etiology therefore a routine follow-up  Patient's hemoglobin is typically between 9-10.  But at times it suddenly drops to less  than 7 and she has been requiring blood transfusions intermittently.  During such occasions her platelet count also dropped down to the 30s but she has not required any platelet transfusion so far.  Her platelet count is normalized on their own.  Patient has had a bone marrow biopsy in September 2021 which did not show any evidence of MDS or leukemia.  The cause of these unexplained drop in her counts is unclear.  Given her age, frailty and underlying heart disease I do not want to put her through another bone marrow biopsy at this time.  Plan is to continue supportive transfusions when her hemoglobin drops to less than 7.  We have checked her repeat iron studies B12 and folate back on 08/05/2020 which was normal.  Repeat CBC with differential and hold tube in 6 weeks and 12 weeks and I will see her back in 12 weeks   Visit Diagnosis 1. Normocytic anemia      Dr. Randa Evens, MD, MPH [facility] [phone] 08/20/2020 8:03 AM

## 2020-08-29 ENCOUNTER — Encounter: Payer: Self-pay | Admitting: Oncology

## 2020-08-29 ENCOUNTER — Inpatient Hospital Stay: Payer: Medicare Other | Admitting: Oncology

## 2020-08-29 ENCOUNTER — Inpatient Hospital Stay: Payer: Medicare Other | Attending: Oncology

## 2020-08-29 VITALS — BP 179/79 | HR 97 | Temp 96.7°F | Resp 16 | Wt 114.8 lb

## 2020-08-29 DIAGNOSIS — Z823 Family history of stroke: Secondary | ICD-10-CM | POA: Diagnosis not present

## 2020-08-29 DIAGNOSIS — I2510 Atherosclerotic heart disease of native coronary artery without angina pectoris: Secondary | ICD-10-CM | POA: Diagnosis not present

## 2020-08-29 DIAGNOSIS — I10 Essential (primary) hypertension: Secondary | ICD-10-CM | POA: Diagnosis not present

## 2020-08-29 DIAGNOSIS — Z79899 Other long term (current) drug therapy: Secondary | ICD-10-CM | POA: Insufficient documentation

## 2020-08-29 DIAGNOSIS — D696 Thrombocytopenia, unspecified: Secondary | ICD-10-CM

## 2020-08-29 DIAGNOSIS — Z8249 Family history of ischemic heart disease and other diseases of the circulatory system: Secondary | ICD-10-CM | POA: Insufficient documentation

## 2020-08-29 DIAGNOSIS — Z881 Allergy status to other antibiotic agents status: Secondary | ICD-10-CM | POA: Diagnosis not present

## 2020-08-29 DIAGNOSIS — R002 Palpitations: Secondary | ICD-10-CM | POA: Insufficient documentation

## 2020-08-29 DIAGNOSIS — I252 Old myocardial infarction: Secondary | ICD-10-CM | POA: Insufficient documentation

## 2020-08-29 DIAGNOSIS — Z809 Family history of malignant neoplasm, unspecified: Secondary | ICD-10-CM | POA: Insufficient documentation

## 2020-08-29 DIAGNOSIS — E785 Hyperlipidemia, unspecified: Secondary | ICD-10-CM | POA: Diagnosis not present

## 2020-08-29 DIAGNOSIS — Z8616 Personal history of COVID-19: Secondary | ICD-10-CM | POA: Diagnosis not present

## 2020-08-29 DIAGNOSIS — Z8673 Personal history of transient ischemic attack (TIA), and cerebral infarction without residual deficits: Secondary | ICD-10-CM | POA: Insufficient documentation

## 2020-08-29 DIAGNOSIS — R54 Age-related physical debility: Secondary | ICD-10-CM | POA: Diagnosis not present

## 2020-08-29 DIAGNOSIS — K219 Gastro-esophageal reflux disease without esophagitis: Secondary | ICD-10-CM | POA: Insufficient documentation

## 2020-08-29 DIAGNOSIS — D649 Anemia, unspecified: Secondary | ICD-10-CM | POA: Insufficient documentation

## 2020-08-29 DIAGNOSIS — Z9049 Acquired absence of other specified parts of digestive tract: Secondary | ICD-10-CM | POA: Insufficient documentation

## 2020-08-29 DIAGNOSIS — E119 Type 2 diabetes mellitus without complications: Secondary | ICD-10-CM | POA: Diagnosis not present

## 2020-08-29 DIAGNOSIS — Z803 Family history of malignant neoplasm of breast: Secondary | ICD-10-CM | POA: Insufficient documentation

## 2020-08-29 DIAGNOSIS — Z888 Allergy status to other drugs, medicaments and biological substances status: Secondary | ICD-10-CM | POA: Diagnosis not present

## 2020-08-29 DIAGNOSIS — Z885 Allergy status to narcotic agent status: Secondary | ICD-10-CM | POA: Insufficient documentation

## 2020-08-29 DIAGNOSIS — Z833 Family history of diabetes mellitus: Secondary | ICD-10-CM | POA: Diagnosis not present

## 2020-08-29 DIAGNOSIS — R7989 Other specified abnormal findings of blood chemistry: Secondary | ICD-10-CM | POA: Diagnosis not present

## 2020-08-29 DIAGNOSIS — D638 Anemia in other chronic diseases classified elsewhere: Secondary | ICD-10-CM

## 2020-08-29 LAB — CBC WITH DIFFERENTIAL/PLATELET
Abs Immature Granulocytes: 0.04 10*3/uL (ref 0.00–0.07)
Basophils Absolute: 0.1 10*3/uL (ref 0.0–0.1)
Basophils Relative: 1 %
Eosinophils Absolute: 0 10*3/uL (ref 0.0–0.5)
Eosinophils Relative: 1 %
HCT: 29.5 % — ABNORMAL LOW (ref 36.0–46.0)
Hemoglobin: 9.7 g/dL — ABNORMAL LOW (ref 12.0–15.0)
Immature Granulocytes: 1 %
Lymphocytes Relative: 27 %
Lymphs Abs: 1.8 10*3/uL (ref 0.7–4.0)
MCH: 34.8 pg — ABNORMAL HIGH (ref 26.0–34.0)
MCHC: 32.9 g/dL (ref 30.0–36.0)
MCV: 105.7 fL — ABNORMAL HIGH (ref 80.0–100.0)
Monocytes Absolute: 0.5 10*3/uL (ref 0.1–1.0)
Monocytes Relative: 8 %
Neutro Abs: 4 10*3/uL (ref 1.7–7.7)
Neutrophils Relative %: 62 %
Platelets: 144 10*3/uL — ABNORMAL LOW (ref 150–400)
RBC: 2.79 MIL/uL — ABNORMAL LOW (ref 3.87–5.11)
RDW: 20.3 % — ABNORMAL HIGH (ref 11.5–15.5)
WBC: 6.5 10*3/uL (ref 4.0–10.5)
nRBC: 0 % (ref 0.0–0.2)

## 2020-08-29 LAB — IRON AND TIBC
Iron: 127 ug/dL (ref 28–170)
Saturation Ratios: 50 % — ABNORMAL HIGH (ref 10.4–31.8)
TIBC: 256 ug/dL (ref 250–450)
UIBC: 129 ug/dL

## 2020-08-29 LAB — SAMPLE TO BLOOD BANK

## 2020-08-29 LAB — FERRITIN: Ferritin: 704 ng/mL — ABNORMAL HIGH (ref 11–307)

## 2020-08-30 NOTE — Progress Notes (Signed)
Hematology/Oncology Consult note [facility]  [phone] [phone]  Patient Care Team: Idelle Crouch, MD as PCP - General (Internal Medicine) Sindy Guadeloupe, MD as Consulting Physician (Oncology)   Name of the patient: Leah Small  [id number]  [birth year]   Date of visit: 08/30/20  Diagnosis- anemia multifactorial likely secondary to chronic disease as well as iron deficiency.  Intermittent thrombocytopenia of unclear etiology  Chief complaint/ Reason for visit-routine follow-up of anemia and thrombocytopenia  Heme/Onc history: Patient is an80 year old female with a past medical history significant for coronary artery disease, CVA, CABG, hypertension hyperlipidemia diabetes GERD among other medical problems. She was recently seen by cardiology for fatigue and palpitations. She has been referred to hematology for anemia. Most recent CBC from 12/04/2019 showed white cell count of 5.7, H&H of 7.5/23.3 and a platelet count of 162. MCV was 99. Looking back at her prior CBCs patient has had chronic anemia with a hemoglobin which was around 10 up until January 2021 after which it dropped to 7.8. CMP showed normal liver and kidney functions. Hepatitis B and C testing has been negative. TSH in January was normal.  Results of blood work from 12/18/2019 were as follows: CBC showed white count of 7.4, H&H of 8.5/27.8 and a platelet count of 537. CMP was significant for mildly elevated creatinine of 1.1. B12 levels were elevated. Folate was normal. TSH was normal. Haptoglobin was normal. Myeloma panel showed no M protein. B1and B6 levels were normal.Coombs test was negative. EPO levels were elevated at 54. LDH was normal. Iron study showed a low ferritin of 11 with elevated TIBC of 545.  Patient was admitted following to the hospital following that visit and was found to have Covid pneumonia.She presented with chest pain but during  cardiac cath had a complications for which the procedure was aborted and medical management was recommended. She was given 1 unit of blood transfusion while she was in the hospital when her Hemoglobin dropped to 7. She also received IV iron. Bone marrow biopsy in September 2021 was otherwise unremarkable and not diagnostic of MDS.  MDS FISH panel negative.  No evidence of lymphoma or leukemia.   Interval history-patient feels better after she has received blood transfusion recently.  Energy levels are improved.  Denies any worsening exertional shortness of breath  ECOG PS- 2 Pain scale- 0   Review of systems- Review of Systems  Constitutional: Positive for malaise/fatigue. Negative for chills, fever and weight loss.  HENT: Negative for congestion, ear discharge and nosebleeds.   Eyes: Negative for blurred vision.  Respiratory: Negative for cough, hemoptysis, sputum production, shortness of breath and wheezing.   Cardiovascular: Negative for chest pain, palpitations, orthopnea and claudication.  Gastrointestinal: Negative for abdominal pain, blood in stool, constipation, diarrhea, heartburn, melena, nausea and vomiting.  Genitourinary: Negative for dysuria, flank pain, frequency, hematuria and urgency.  Musculoskeletal: Negative for back pain, joint pain and myalgias.  Skin: Negative for rash.  Neurological: Negative for dizziness, tingling, focal weakness, seizures, weakness and headaches.  Endo/Heme/Allergies: Does not bruise/bleed easily.  Psychiatric/Behavioral: Negative for depression and suicidal ideas. The patient does not have insomnia.      Allergies  Allergen Reactions  . Adhesive [Tape] Other (See Comments)    Bruising and TEARING!!!  Please use "paper" tape  . Talwin [Pentazocine] Anaphylaxis and Swelling    Throat swelling  . Valium [Diazepam] Other (See Comments)    Makes her feel like she's flying  .  Vicodin [Hydrocodone-Acetaminophen] Nausea And Vomiting  . Beta  Adrenergic Blockers Other (See Comments)    Pt states she not allergic  . Levofloxacin Swelling and Other (See Comments)    Tongue swells   . Simvastatin Swelling and Rash    Mouth swelling     Past Medical History:  Diagnosis Date  . Acid reflux   . Arthritis    "all over"  . 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 Coronary artery disease   . Hypercholesterolemia   . Hypertension   . Myocardial infarction ([place]) 1991; 07/06/2015  . NSTEMI (non-ST elevated myocardial infarction) ([place]) 10/22/2015  . Psoriatic arthritis ([place])   . 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 TIA (transient ischemic attack)    "several over the last 20 years" (10/22/2015)  . Type II diabetes mellitus ([place])      Past Surgical History:  Procedure Laterality Date  . ABDOMINAL HYSTERECTOMY  1973  . APPENDECTOMY  1950s   elementary school  . CARDIAC CATHETERIZATION  X 2-3  . CARDIAC CATHETERIZATION N/A 11/21/2015   Procedure: Left Heart Cath and Coronary Angiography;  Surgeon: Jettie Booze, MD;  Location: [place] [facility];  Service: Cardiovascular;  Laterality: N/A;  . CARDIAC CATHETERIZATION  11/21/2015   Procedure: Coronary Stent Intervention;  Surgeon: Jettie Booze, MD;  Location: [place] [facility];  Service: Cardiovascular;;  . CARDIAC CATHETERIZATION N/A 01/29/2016   Procedure: Left Heart Cath and Cors/Grafts Angiography;  Surgeon: Sherren Mocha, MD;  Location: [place] [facility];  Service: Cardiovascular;  Laterality: N/A;  . CARDIAC CATHETERIZATION N/A 07/22/2016   Procedure: Left Heart Cath and Coronary Angiography;  Surgeon: Wellington Hampshire, MD;  Location: [place] [facility];  Service: Cardiovascular;  Laterality: N/A;  . CAROTID STENT INSERTION Bilateral 2011-2012   right-left  . CATARACT EXTRACTION W/ INTRAOCULAR LENS  IMPLANT, BILATERAL Bilateral 2009-2010  . CHOLECYSTECTOMY OPEN  ~ 2014  . CORONARY ANGIOPLASTY    .  CORONARY ANGIOPLASTY WITH STENT PLACEMENT  11/21/2015  . CORONARY ARTERY BYPASS GRAFT  08/1995  . CORONARY/GRAFT ACUTE MI REVASCULARIZATION N/A 12/28/2019   Procedure: Coronary/Graft Acute MI Revascularization;  Surgeon: Yolonda Kida, MD;  Location: [place] [facility];  Service: Cardiovascular;  Laterality: N/A;  . DILATION AND CURETTAGE OF UTERUS  X 1  . LEFT HEART CATH AND CORONARY ANGIOGRAPHY N/A 12/28/2019   Procedure: LEFT HEART CATH AND CORONARY ANGIOGRAPHY;  Surgeon: Yolonda Kida, MD;  Location: [place] [facility];  Service: Cardiovascular;  Laterality: N/A;  . 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 TONSILLECTOMY  ~ 1947   2nd grade    Social History   Socioeconomic History  . Marital status: Widowed    Spouse name: Not on file  . Number of children: Not on file  . Years of education: Not on file  . Highest education level: Not on file  Occupational History  . Not on file  Tobacco Use  . Smoking status: Never Smoker  . Smokeless tobacco: Never Used  Substance and Sexual Activity  . Alcohol use: No  . Drug use: No  . Sexual activity: Never  Other Topics Concern  . Not on file  Social History Narrative   Lives at home with one of her sons. Independent at baseline.   Social Determinants of Health   Financial Resource Strain: Not on  file  Food Insecurity: Not on file  Transportation Needs: Not on file  Physical Activity: Not on file  Stress: Not on file  Social Connections: Not on file  Intimate Partner Violence: Not on file    Family History  Problem Relation Age of Onset  . Heart attack Father   . Diabetes Mother   . Cancer Mother   . Stroke Mother   . Breast cancer Cousin   . Breast cancer Cousin   . Breast cancer Cousin      Current Outpatient Medications:  .  aspirin EC 81 MG tablet, Take 81 mg by mouth at bedtime., Disp: , Rfl:  .  atorvastatin (LIPITOR) 40 MG tablet, Take 40 mg by  mouth daily., Disp: , Rfl:  .  clopidogrel (PLAVIX) 75 MG tablet, Take 1 tablet (75 mg total) by mouth every morning. (Patient taking differently: Take 75 mg by mouth daily.), Disp: 30 tablet, Rfl: 2 .  glipiZIDE (GLUCOTROL XL) 5 MG 24 hr tablet, Take 5 mg by mouth daily with breakfast., Disp: , Rfl:  .  lamoTRIgine (LAMICTAL) 100 MG tablet, Take 100 mg by mouth daily., Disp: , Rfl:  .  levETIRAcetam (KEPPRA) 500 MG tablet, Take 500 mg by mouth 2 (two) times daily., Disp: , Rfl:  .  metFORMIN (GLUCOPHAGE) 1000 MG tablet, Take 1 tablet (1,000 mg total) by mouth 2 (two) times daily., Disp: , Rfl:  .  nitroGLYCERIN (NITROSTAT) 0.4 MG SL tablet, Place 0.4 mg under the tongue every 5 (five) minutes as needed for chest pain., Disp: , Rfl:  .  nystatin cream (MYCOSTATIN), Apply 1 application topically 2 (two) times daily as needed (yeast infection). , Disp: , Rfl:  .  SIMBRINZA 1-0.2 % SUSP, Place 1 drop into both eyes 2 (two) times daily. , Disp: , Rfl:  .  timolol (TIMOPTIC) 0.5 % ophthalmic solution, Place 1 drop into both eyes 2 (two) times daily., Disp: , Rfl:  .  triamcinolone ointment (KENALOG) 0.1 %, Apply 1 application topically 3 (three) times daily as needed (itching from psoriasis)., Disp: , Rfl:  .  vitamin B-12 (CYANOCOBALAMIN) 1000 MCG tablet, Take 500 mcg by mouth every morning. , Disp: , Rfl:  .  vitamin E 400 UNIT capsule, Take 400 Units by mouth every morning. , Disp: , Rfl:  .  ascorbic acid (VITAMIN C) 500 MG tablet, Take 1 tablet (500 mg total) by mouth daily. (Patient not taking: Reported on 04/26/2020), Disp: 30 tablet, Rfl: 0 .  folic acid (FOLVITE) 1 MG tablet, Take 1 mg by mouth daily. (Patient not taking: Reported on 04/26/2020), Disp: , Rfl:  .  gabapentin (NEURONTIN) 100 MG capsule, Take 100-200 mg by mouth daily as needed. (Patient not taking: Reported on 04/26/2020), Disp: , Rfl:  .  methotrexate 2.5 MG tablet, , Disp: , Rfl:  .  metoprolol succinate (TOPROL-XL) 25 MG 24 hr  tablet, Take 1 tablet (25 mg total) by mouth daily. (Patient not taking: Reported on 05/28/2020), Disp: 30 tablet, Rfl: 0 .  ramipril (ALTACE) 5 MG capsule, Take 5 mg by mouth daily., Disp: , Rfl:  .  ranolazine (RANEXA) 500 MG 12 hr tablet, Take 1 tablet (500 mg total) by mouth 2 (two) times daily., Disp: 60 tablet, Rfl: 1 .  zinc sulfate 220 (50 Zn) MG capsule, Take 1 capsule (220 mg total) by mouth daily. (Patient not taking: Reported on 04/26/2020), Disp: 30 capsule, Rfl: 0  Physical exam:  Vitals:   08/29/20 1408  BP: (!) 179/79  Pulse: 97  Resp: 16  Temp: (!) 96.7 F (35.9 C)  TempSrc: Tympanic  SpO2: 100%  Weight: 114 lb 12.8 oz (52.1 kg)   Physical Exam Constitutional:      Comments: Thin elderly frail woman who ambulates with a walker.  Appears in no acute distress.  Does not appear short of breath  Eyes:     Extraocular Movements: EOM normal.  Cardiovascular:     Rate and Rhythm: Normal rate and regular rhythm.     Heart sounds: Normal heart sounds.  Pulmonary:     Effort: Pulmonary effort is normal.     Breath sounds: Normal breath sounds.  Abdominal:     General: Bowel sounds are normal.     Palpations: Abdomen is soft.  Musculoskeletal:     Comments: Trace bilateral edema  Skin:    General: Skin is warm and dry.  Neurological:     Mental Status: She is alert and oriented to person, place, and time.      CMP Latest Ref Rng & Units 05/08/2020  Glucose 70 - 99 mg/dL 177(H)  BUN 8 - 23 mg/dL 27(H)  Creatinine 0.44 - 1.00 mg/dL 0.90  Sodium 135 - 145 mmol/L 129(L)  Potassium 3.5 - 5.1 mmol/L 4.9  Chloride 98 - 111 mmol/L 97(L)  CO2 22 - 32 mmol/L 22  Calcium 8.9 - 10.3 mg/dL 8.8(L)  Total Protein 6.5 - 8.1 g/dL -  Total Bilirubin 0.3 - 1.2 mg/dL -  Alkaline Phos 38 - 126 U/L -  AST 15 - 41 U/L -  ALT 0 - 44 U/L -   CBC Latest Ref Rng & Units 08/29/2020  WBC 4.0 - 10.5 K/uL 6.5  Hemoglobin 12.0 - 15.0 g/dL 9.7(L)  Hematocrit 36.0 - 46.0 % 29.5(L)  Platelets  150 - 400 K/uL 144(L)      Assessment and plan- Patient is a 80 y.o. female here for follow-up of normocytic anemia and thrombocytopenia  Normocytic anemia: Patient has undergone extensive peripheral blood work-up as well as bone marrow biopsy which did not reveal a clear etiology of anemia.  She has been getting IV iron intermittently in the past but has not improved her anemia significantly.  At baseline hemoglobin runs around 9-10.  There are times when her hemoglobin suddenly dropped down to 6 without any evidence of GI bleeding.  She has been receiving blood transfusions during these episodes which are roughly once in 6 to 8 weeks.  Following that her anemia improves and tends to remain around 9-10 for the next couple of months  Thrombocytopenia: Again her platelets have been randomly dropping down to 30s without any clear etiology and then come back up to normal on its own without any intervention.  Given her advanced age and frailty I do not plan to repeat a bone marrow biopsy at this time.  Plan is to continue monitoring her CBC on a monthly basis and provide supportive transfusions as necessary.  Patient and her son verbalized understanding  Check CBC with differential in 1 month to month in 3 months and each time hold tube for possible transfusion and I will see her in 3 months   Visit Diagnosis 1. Normocytic anemia   2. Thrombocytopenia ([place])      Dr. Randa Evens, MD, MPH [facility] [phone] 08/30/2020 8:44 AM

## 2020-09-17 ENCOUNTER — Telehealth: Payer: Self-pay | Admitting: Adult Health Nurse Practitioner

## 2020-09-17 NOTE — Telephone Encounter (Signed)
Returned patient's VM wanting to reschedule visit.  Rescheduled visit for  10/08/20 at 10:30 AM. Jessieca Rhem K. Garner Nash NP

## 2020-09-24 ENCOUNTER — Other Ambulatory Visit: Payer: Medicare Other | Admitting: Adult Health Nurse Practitioner

## 2020-09-26 ENCOUNTER — Inpatient Hospital Stay: Payer: Medicare Other | Attending: Oncology

## 2020-09-26 DIAGNOSIS — D638 Anemia in other chronic diseases classified elsewhere: Secondary | ICD-10-CM | POA: Diagnosis not present

## 2020-09-26 LAB — CBC WITH DIFFERENTIAL/PLATELET
Abs Immature Granulocytes: 0.02 10*3/uL (ref 0.00–0.07)
Basophils Absolute: 0 10*3/uL (ref 0.0–0.1)
Basophils Relative: 1 %
Eosinophils Absolute: 0.2 10*3/uL (ref 0.0–0.5)
Eosinophils Relative: 2 %
HCT: 32.2 % — ABNORMAL LOW (ref 36.0–46.0)
Hemoglobin: 10.7 g/dL — ABNORMAL LOW (ref 12.0–15.0)
Immature Granulocytes: 0 %
Lymphocytes Relative: 13 %
Lymphs Abs: 0.9 10*3/uL (ref 0.7–4.0)
MCH: 36 pg — ABNORMAL HIGH (ref 26.0–34.0)
MCHC: 33.2 g/dL (ref 30.0–36.0)
MCV: 108.4 fL — ABNORMAL HIGH (ref 80.0–100.0)
Monocytes Absolute: 0.3 10*3/uL (ref 0.1–1.0)
Monocytes Relative: 5 %
Neutro Abs: 5.2 10*3/uL (ref 1.7–7.7)
Neutrophils Relative %: 79 %
Platelets: 127 10*3/uL — ABNORMAL LOW (ref 150–400)
RBC: 2.97 MIL/uL — ABNORMAL LOW (ref 3.87–5.11)
RDW: 18.8 % — ABNORMAL HIGH (ref 11.5–15.5)
WBC: 6.6 10*3/uL (ref 4.0–10.5)
nRBC: 0 % (ref 0.0–0.2)

## 2020-09-27 ENCOUNTER — Other Ambulatory Visit: Payer: Medicare Other

## 2020-10-07 ENCOUNTER — Emergency Department: Payer: Medicare Other

## 2020-10-07 ENCOUNTER — Encounter: Payer: Self-pay | Admitting: Medical Oncology

## 2020-10-07 ENCOUNTER — Other Ambulatory Visit: Payer: Self-pay

## 2020-10-07 ENCOUNTER — Inpatient Hospital Stay
Admission: EM | Admit: 2020-10-07 | Discharge: 2020-10-13 | DRG: 291 | Disposition: A | Discharge status: Home Health | Payer: Medicare Other | Attending: Family Medicine | Admitting: Family Medicine

## 2020-10-07 DIAGNOSIS — Z87892 Personal history of anaphylaxis: Secondary | ICD-10-CM

## 2020-10-07 DIAGNOSIS — J9601 Acute respiratory failure with hypoxia: Secondary | ICD-10-CM | POA: Diagnosis present

## 2020-10-07 DIAGNOSIS — I081 Rheumatic disorders of both mitral and tricuspid valves: Secondary | ICD-10-CM | POA: Diagnosis present

## 2020-10-07 DIAGNOSIS — Z951 Presence of aortocoronary bypass graft: Secondary | ICD-10-CM

## 2020-10-07 DIAGNOSIS — I509 Heart failure, unspecified: Secondary | ICD-10-CM

## 2020-10-07 DIAGNOSIS — Z888 Allergy status to other drugs, medicaments and biological substances status: Secondary | ICD-10-CM

## 2020-10-07 DIAGNOSIS — R0602 Shortness of breath: Secondary | ICD-10-CM

## 2020-10-07 DIAGNOSIS — I25118 Atherosclerotic heart disease of native coronary artery with other forms of angina pectoris: Secondary | ICD-10-CM | POA: Diagnosis present

## 2020-10-07 DIAGNOSIS — Z6821 Body mass index (BMI) 21.0-21.9, adult: Secondary | ICD-10-CM

## 2020-10-07 DIAGNOSIS — E785 Hyperlipidemia, unspecified: Secondary | ICD-10-CM | POA: Diagnosis present

## 2020-10-07 DIAGNOSIS — Z20822 Contact with and (suspected) exposure to covid-19: Secondary | ICD-10-CM | POA: Diagnosis present

## 2020-10-07 DIAGNOSIS — I252 Old myocardial infarction: Secondary | ICD-10-CM

## 2020-10-07 DIAGNOSIS — I10 Essential (primary) hypertension: Secondary | ICD-10-CM

## 2020-10-07 DIAGNOSIS — E1140 Type 2 diabetes mellitus with diabetic neuropathy, unspecified: Secondary | ICD-10-CM | POA: Diagnosis present

## 2020-10-07 DIAGNOSIS — E875 Hyperkalemia: Secondary | ICD-10-CM | POA: Diagnosis present

## 2020-10-07 DIAGNOSIS — Z91048 Other nonmedicinal substance allergy status: Secondary | ICD-10-CM

## 2020-10-07 DIAGNOSIS — Z8249 Family history of ischemic heart disease and other diseases of the circulatory system: Secondary | ICD-10-CM

## 2020-10-07 DIAGNOSIS — I110 Hypertensive heart disease with heart failure: Secondary | ICD-10-CM | POA: Diagnosis not present

## 2020-10-07 DIAGNOSIS — Z955 Presence of coronary angioplasty implant and graft: Secondary | ICD-10-CM

## 2020-10-07 DIAGNOSIS — G40209 Localization-related (focal) (partial) symptomatic epilepsy and epileptic syndromes with complex partial seizures, not intractable, without status epilepticus: Secondary | ICD-10-CM | POA: Diagnosis present

## 2020-10-07 DIAGNOSIS — Z823 Family history of stroke: Secondary | ICD-10-CM

## 2020-10-07 DIAGNOSIS — I25110 Atherosclerotic heart disease of native coronary artery with unstable angina pectoris: Secondary | ICD-10-CM

## 2020-10-07 DIAGNOSIS — I5023 Acute on chronic systolic (congestive) heart failure: Secondary | ICD-10-CM | POA: Diagnosis present

## 2020-10-07 DIAGNOSIS — D61818 Other pancytopenia: Secondary | ICD-10-CM | POA: Diagnosis present

## 2020-10-07 DIAGNOSIS — Z7902 Long term (current) use of antithrombotics/antiplatelets: Secondary | ICD-10-CM

## 2020-10-07 DIAGNOSIS — R413 Other amnesia: Secondary | ICD-10-CM

## 2020-10-07 DIAGNOSIS — Z833 Family history of diabetes mellitus: Secondary | ICD-10-CM

## 2020-10-07 DIAGNOSIS — N179 Acute kidney failure, unspecified: Secondary | ICD-10-CM | POA: Diagnosis present

## 2020-10-07 DIAGNOSIS — I2510 Atherosclerotic heart disease of native coronary artery without angina pectoris: Secondary | ICD-10-CM | POA: Diagnosis present

## 2020-10-07 DIAGNOSIS — L4050 Arthropathic psoriasis, unspecified: Secondary | ICD-10-CM | POA: Diagnosis present

## 2020-10-07 DIAGNOSIS — L89150 Pressure ulcer of sacral region, unstageable: Secondary | ICD-10-CM

## 2020-10-07 DIAGNOSIS — D638 Anemia in other chronic diseases classified elsewhere: Secondary | ICD-10-CM

## 2020-10-07 DIAGNOSIS — Z90710 Acquired absence of both cervix and uterus: Secondary | ICD-10-CM

## 2020-10-07 DIAGNOSIS — Z885 Allergy status to narcotic agent status: Secondary | ICD-10-CM

## 2020-10-07 DIAGNOSIS — K219 Gastro-esophageal reflux disease without esophagitis: Secondary | ICD-10-CM | POA: Diagnosis present

## 2020-10-07 DIAGNOSIS — Z7982 Long term (current) use of aspirin: Secondary | ICD-10-CM

## 2020-10-07 DIAGNOSIS — J90 Pleural effusion, not elsewhere classified: Secondary | ICD-10-CM

## 2020-10-07 DIAGNOSIS — Z9889 Other specified postprocedural states: Secondary | ICD-10-CM

## 2020-10-07 DIAGNOSIS — J918 Pleural effusion in other conditions classified elsewhere: Secondary | ICD-10-CM | POA: Diagnosis present

## 2020-10-07 DIAGNOSIS — E118 Type 2 diabetes mellitus with unspecified complications: Secondary | ICD-10-CM | POA: Diagnosis present

## 2020-10-07 DIAGNOSIS — Z79899 Other long term (current) drug therapy: Secondary | ICD-10-CM

## 2020-10-07 DIAGNOSIS — E440 Moderate protein-calorie malnutrition: Secondary | ICD-10-CM

## 2020-10-07 DIAGNOSIS — Z66 Do not resuscitate: Secondary | ICD-10-CM | POA: Diagnosis present

## 2020-10-07 DIAGNOSIS — Z8673 Personal history of transient ischemic attack (TIA), and cerebral infarction without residual deficits: Secondary | ICD-10-CM

## 2020-10-07 DIAGNOSIS — Z7984 Long term (current) use of oral hypoglycemic drugs: Secondary | ICD-10-CM

## 2020-10-07 DIAGNOSIS — E7800 Pure hypercholesterolemia, unspecified: Secondary | ICD-10-CM | POA: Diagnosis present

## 2020-10-07 DIAGNOSIS — E43 Unspecified severe protein-calorie malnutrition: Secondary | ICD-10-CM | POA: Diagnosis present

## 2020-10-07 DIAGNOSIS — I639 Cerebral infarction, unspecified: Secondary | ICD-10-CM | POA: Diagnosis present

## 2020-10-07 DIAGNOSIS — D649 Anemia, unspecified: Secondary | ICD-10-CM | POA: Diagnosis present

## 2020-10-07 DIAGNOSIS — L89152 Pressure ulcer of sacral region, stage 2: Secondary | ICD-10-CM | POA: Diagnosis present

## 2020-10-07 DIAGNOSIS — E1165 Type 2 diabetes mellitus with hyperglycemia: Secondary | ICD-10-CM | POA: Diagnosis present

## 2020-10-07 DIAGNOSIS — I420 Dilated cardiomyopathy: Secondary | ICD-10-CM | POA: Diagnosis present

## 2020-10-07 HISTORY — DX: Heart failure, unspecified: I50.9

## 2020-10-07 HISTORY — DX: Dyspnea, unspecified: R06.00

## 2020-10-07 LAB — CBC
HCT: 33.1 % — ABNORMAL LOW (ref 36.0–46.0)
Hemoglobin: 10.6 g/dL — ABNORMAL LOW (ref 12.0–15.0)
MCH: 36.2 pg — ABNORMAL HIGH (ref 26.0–34.0)
MCHC: 32 g/dL (ref 30.0–36.0)
MCV: 113 fL — ABNORMAL HIGH (ref 80.0–100.0)
Platelets: 126 10*3/uL — ABNORMAL LOW (ref 150–400)
RBC: 2.93 MIL/uL — ABNORMAL LOW (ref 3.87–5.11)
RDW: 18.9 % — ABNORMAL HIGH (ref 11.5–15.5)
WBC: 6.5 10*3/uL (ref 4.0–10.5)
nRBC: 0 % (ref 0.0–0.2)

## 2020-10-07 LAB — COMPREHENSIVE METABOLIC PANEL
ALT: 13 U/L (ref 0–44)
AST: 21 U/L (ref 15–41)
Albumin: 3.7 g/dL (ref 3.5–5.0)
Alkaline Phosphatase: 63 U/L (ref 38–126)
Anion gap: 9 (ref 5–15)
BUN: 35 mg/dL — ABNORMAL HIGH (ref 8–23)
CO2: 21 mmol/L — ABNORMAL LOW (ref 22–32)
Calcium: 8.9 mg/dL (ref 8.9–10.3)
Chloride: 102 mmol/L (ref 98–111)
Creatinine, Ser: 1.21 mg/dL — ABNORMAL HIGH (ref 0.44–1.00)
GFR, Estimated: 45 mL/min — ABNORMAL LOW (ref >60)
Glucose, Bld: 222 mg/dL — ABNORMAL HIGH (ref 70–99)
Potassium: 6 mmol/L — ABNORMAL HIGH (ref 3.5–5.1)
Sodium: 132 mmol/L — ABNORMAL LOW (ref 135–145)
Total Bilirubin: 1.1 mg/dL (ref 0.3–1.2)
Total Protein: 7.8 g/dL (ref 6.5–8.1)

## 2020-10-07 LAB — RESP PANEL BY RT-PCR (FLU A&B, COVID) ARPGX2
Influenza A by PCR: NEGATIVE
Influenza B by PCR: NEGATIVE
SARS Coronavirus 2 by RT PCR: NEGATIVE

## 2020-10-07 LAB — TROPONIN I (HIGH SENSITIVITY)
Troponin I (High Sensitivity): 18 ng/L — ABNORMAL HIGH (ref <18)
Troponin I (High Sensitivity): 18 ng/L — ABNORMAL HIGH (ref <18)

## 2020-10-07 LAB — BRAIN NATRIURETIC PEPTIDE: B Natriuretic Peptide: >4500 pg/mL — ABNORMAL HIGH (ref 0.0–100.0)

## 2020-10-07 MED ORDER — FUROSEMIDE 10 MG/ML IJ SOLN
40.0000 mg | Freq: Two times a day (BID) | INTRAMUSCULAR | Status: DC
  Administered 2020-10-08 (×2): 40 mg via INTRAVENOUS
  Filled 2020-10-07 (×2): qty 4

## 2020-10-07 MED ORDER — ALBUTEROL SULFATE (2.5 MG/3ML) 0.083% IN NEBU
2.5000 mg | INHALATION_SOLUTION | RESPIRATORY_TRACT | Status: AC
  Administered 2020-10-08 (×2): 2.5 mg via RESPIRATORY_TRACT
  Filled 2020-10-07 (×2): qty 3

## 2020-10-07 MED ORDER — LEVETIRACETAM 500 MG PO TABS
500.0000 mg | ORAL_TABLET | Freq: Two times a day (BID) | ORAL | Status: DC
  Administered 2020-10-08 – 2020-10-13 (×11): 500 mg via ORAL
  Filled 2020-10-07 (×11): qty 1

## 2020-10-07 MED ORDER — ASCORBIC ACID 500 MG PO TABS
500.0000 mg | ORAL_TABLET | Freq: Every day | ORAL | Status: DC
  Administered 2020-10-10 – 2020-10-11 (×2): 500 mg via ORAL
  Filled 2020-10-07 (×5): qty 1

## 2020-10-07 MED ORDER — CLOPIDOGREL BISULFATE 75 MG PO TABS
75.0000 mg | ORAL_TABLET | Freq: Every day | ORAL | Status: DC
Start: 2020-10-07 — End: 2020-10-13
  Administered 2020-10-08 – 2020-10-13 (×6): 75 mg via ORAL
  Filled 2020-10-07 (×6): qty 1

## 2020-10-07 MED ORDER — NITROGLYCERIN 0.4 MG SL SUBL: 0.4000 mg | SUBLINGUAL_TABLET | SUBLINGUAL | Status: DC | PRN

## 2020-10-07 MED ORDER — ASPIRIN EC 81 MG PO TBEC
81.0000 mg | DELAYED_RELEASE_TABLET | Freq: Every day | ORAL | Status: DC
  Filled 2020-10-07 (×2): qty 1

## 2020-10-07 MED ORDER — CYANOCOBALAMIN 500 MCG PO TABS
500.0000 ug | ORAL_TABLET | ORAL | Status: DC
  Administered 2020-10-08 – 2020-10-13 (×6): 500 ug via ORAL
  Filled 2020-10-07 (×7): qty 1

## 2020-10-07 MED ORDER — TIMOLOL MALEATE 0.5 % OP SOLN
1.0000 [drp] | Freq: Two times a day (BID) | OPHTHALMIC | Status: DC
  Administered 2020-10-08 – 2020-10-13 (×11): 1 [drp] via OPHTHALMIC
  Filled 2020-10-07: qty 5

## 2020-10-07 MED ORDER — INSULIN ASPART 100 UNIT/ML ~~LOC~~ SOLN
0.0000 [IU] | Freq: Every day | SUBCUTANEOUS | Status: DC
  Administered 2020-10-11: 2 [IU] via SUBCUTANEOUS
  Filled 2020-10-07: qty 1

## 2020-10-07 MED ORDER — ACETAMINOPHEN 650 MG RE SUPP: 325.0000 mg | Freq: Four times a day (QID) | RECTAL | Status: AC | PRN

## 2020-10-07 MED ORDER — ONDANSETRON HCL 4 MG PO TABS: 4.0000 mg | ORAL_TABLET | Freq: Four times a day (QID) | ORAL | Status: DC | PRN

## 2020-10-07 MED ORDER — ATORVASTATIN CALCIUM 20 MG PO TABS
40.0000 mg | ORAL_TABLET | Freq: Every day | ORAL | Status: DC
Start: 2020-10-07 — End: 2020-10-13
  Filled 2020-10-07 (×6): qty 2

## 2020-10-07 MED ORDER — INSULIN ASPART 100 UNIT/ML ~~LOC~~ SOLN
0.0000 [IU] | Freq: Three times a day (TID) | SUBCUTANEOUS | Status: DC
  Administered 2020-10-08 (×2): 2 [IU] via SUBCUTANEOUS
  Administered 2020-10-09 – 2020-10-10 (×2): 3 [IU] via SUBCUTANEOUS
  Administered 2020-10-10: 1 [IU] via SUBCUTANEOUS
  Administered 2020-10-10: 5 [IU] via SUBCUTANEOUS
  Administered 2020-10-11: 2 [IU] via SUBCUTANEOUS
  Administered 2020-10-11: 5 [IU] via SUBCUTANEOUS
  Administered 2020-10-11: 3 [IU] via SUBCUTANEOUS
  Administered 2020-10-12: 7 [IU] via SUBCUTANEOUS
  Administered 2020-10-12: 5 [IU] via SUBCUTANEOUS
  Administered 2020-10-13: 2 [IU] via SUBCUTANEOUS
  Filled 2020-10-07 (×12): qty 1

## 2020-10-07 MED ORDER — INSULIN REGULAR HUMAN 100 UNIT/ML IJ SOLN: 5.0000 [IU] | Freq: Once | INTRAMUSCULAR | Status: DC

## 2020-10-07 MED ORDER — FUROSEMIDE 10 MG/ML IJ SOLN
60.0000 mg | Freq: Once | INTRAMUSCULAR | Status: AC
  Administered 2020-10-07: 60 mg via INTRAVENOUS
  Filled 2020-10-07: qty 8

## 2020-10-07 MED ORDER — RANOLAZINE ER 500 MG PO TB12
500.0000 mg | ORAL_TABLET | Freq: Two times a day (BID) | ORAL | Status: DC
  Administered 2020-10-08 – 2020-10-13 (×11): 500 mg via ORAL
  Filled 2020-10-07 (×14): qty 1

## 2020-10-07 MED ORDER — LAMOTRIGINE 100 MG PO TABS
100.0000 mg | ORAL_TABLET | Freq: Every day | ORAL | Status: DC
  Administered 2020-10-08 – 2020-10-12 (×5): 100 mg via ORAL
  Filled 2020-10-07 (×6): qty 1

## 2020-10-07 MED ORDER — FOLIC ACID 1 MG PO TABS
1.0000 mg | ORAL_TABLET | Freq: Every day | ORAL | Status: DC
  Administered 2020-10-08 – 2020-10-09 (×2): 1 mg via ORAL
  Filled 2020-10-07 (×6): qty 1

## 2020-10-07 MED ORDER — ZINC SULFATE 220 (50 ZN) MG PO CAPS
220.0000 mg | ORAL_CAPSULE | Freq: Every day | ORAL | Status: DC
  Filled 2020-10-07 (×6): qty 1

## 2020-10-07 MED ORDER — ACETAMINOPHEN 325 MG PO TABS: 325.0000 mg | ORAL_TABLET | Freq: Four times a day (QID) | ORAL | Status: AC | PRN

## 2020-10-07 MED ORDER — ONDANSETRON HCL 4 MG/2ML IJ SOLN
4.0000 mg | Freq: Four times a day (QID) | INTRAMUSCULAR | Status: DC | PRN
  Filled 2020-10-07: qty 2

## 2020-10-07 MED ORDER — ENOXAPARIN SODIUM 40 MG/0.4ML ~~LOC~~ SOLN
40.0000 mg | SUBCUTANEOUS | Status: DC
  Administered 2020-10-08 – 2020-10-13 (×6): 40 mg via SUBCUTANEOUS
  Filled 2020-10-07 (×6): qty 0.4

## 2020-10-07 NOTE — ED Notes (Signed)
Patient is resting comfortably. Patient and son at bedside updated on POC. Patient is pending eval from inpatient MD for hospital admission at this time.

## 2020-10-07 NOTE — ED Provider Notes (Signed)
[facility] Emergency Department Provider Note  Time seen: 5:02 PM  I have reviewed the triage vital signs and the nursing notes.   HISTORY  Chief Complaint Shortness of Breath   HPI Leah Small is a 80 y.o. female with a past medical history of angina, CAD, hypertension, past MI, diabetes, presents to the emergency department for shortness of breath.  According to the patient over the past week or so she has had progressively worsening shortness of breath.  Has also been noticing swelling to her legs.  Denies any chest pain.  Does state a cough but denies sputum production.  No known fever.  No vomiting or diarrhea.  Currently on room air satting in the 90s.   Past Medical History:  Diagnosis Date  . Acid reflux   . Arthritis    "all over"  . Chronic stable angina (HCC)   . Coronary artery disease   . Hypercholesterolemia   . Hypertension   . Myocardial infarction (HCC) 1991; 07/06/2015  . NSTEMI (non-ST elevated myocardial infarction) (HCC) 10/22/2015  . Psoriatic arthritis (HCC)   . 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 TIA (transient ischemic attack)    "several over the last 20 years" (10/22/2015)  . Type II diabetes mellitus [facility])     Patient Active Problem List   Diagnosis Date Noted  . Chest pain due to CAD (HCC) 04/26/2020  . Anemia 04/26/2020  . Severe thrombocytopenia (HCC) 04/26/2020  . Pancytopenia (HCC)   . Iron deficiency anemia 01/25/2020  . History of ETT   . Acute respiratory failure with hypoxia (HCC)   . Palliative care by specialist   . Goals of care, counseling/discussion   . Pressure injury of skin 12/30/2019  . STEMI (ST elevation myocardial infarction) (HCC) 12/28/2019  . Malnutrition of moderate degree 08/12/2017  . Hypotension   . Carotid stenosis 11/12/2016  . Hypertensive heart disease 01/29/2016  . Normocytic anemia 01/29/2016  . History of vaginal  bleeding 01/29/2016  . GERD (gastroesophageal reflux disease) 01/29/2016  . NSTEMI (non-ST elevated myocardial infarction) (HCC) 10/22/2015  . Non-STEMI (non-ST elevated myocardial infarction) (HCC) 10/22/2015  . 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 Coronary atherosclerosis of native coronary artery 10/22/2015  . Angina pectoris (HCC)   . Memory deficit   . Stroke (HCC) 07/06/2015  . Diabetes mellitus with complication (HCC) 07/06/2015  . Essential hypertension 07/06/2015  . Hyperlipidemia 07/06/2015  . Left-sided weakness     Past Surgical History:  Procedure Laterality Date  . ABDOMINAL HYSTERECTOMY  1973  . APPENDECTOMY  1950s   elementary school  . CARDIAC CATHETERIZATION  X 2-3  . CARDIAC CATHETERIZATION N/A 11/21/2015   Procedure: Left Heart Cath and Coronary Angiography;  Surgeon: Corky Crafts, MD;  Location: [facility] INVASIVE CV LAB;  Service: Cardiovascular;  Laterality: N/A;  . CARDIAC CATHETERIZATION  11/21/2015   Procedure: Coronary Stent Intervention;  Surgeon: Corky Crafts, MD;  Location: [facility] INVASIVE CV LAB;  Service: Cardiovascular;;  . CARDIAC CATHETERIZATION N/A 01/29/2016   Procedure: Left Heart Cath and Cors/Grafts Angiography;  Surgeon: Tonny Bollman, MD;  Location: [facility] INVASIVE CV LAB;  Service: Cardiovascular;  Laterality: N/A;  . CARDIAC CATHETERIZATION N/A 07/22/2016   Procedure: Left Heart Cath and Coronary Angiography;  Surgeon: Iran Ouch, MD;  Location: [facility] INVASIVE CV LAB;  Service: Cardiovascular;  Laterality: N/A;  . CAROTID STENT INSERTION Bilateral 2011-2012  right-left  . CATARACT EXTRACTION W/ INTRAOCULAR LENS  IMPLANT, BILATERAL Bilateral 2009-2010  . CHOLECYSTECTOMY OPEN  ~ 2014  . CORONARY ANGIOPLASTY    . CORONARY ANGIOPLASTY WITH STENT PLACEMENT  11/21/2015  . CORONARY ARTERY BYPASS GRAFT  08/1995  . CORONARY/GRAFT ACUTE MI REVASCULARIZATION N/A 12/28/2019   Procedure: Coronary/Graft Acute MI Revascularization;   Surgeon: Alwyn Peaallwood, Dwayne D, MD;  Location: [facility] INVASIVE CV LAB;  Service: Cardiovascular;  Laterality: N/A;  . DILATION AND CURETTAGE OF UTERUS  X 1  . LEFT HEART CATH AND CORONARY ANGIOGRAPHY N/A 12/28/2019   Procedure: LEFT HEART CATH AND CORONARY ANGIOGRAPHY;  Surgeon: Alwyn Peaallwood, Dwayne D, MD;  Location: [facility] INVASIVE CV LAB;  Service: Cardiovascular;  Laterality: N/A;  . 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 TONSILLECTOMY  ~ 1947   2nd grade    Prior to Admission medications   Medication Sig Start Date End Date Taking? Authorizing Provider  ascorbic acid (VITAMIN C) 500 MG tablet Take 1 tablet (500 mg total) by mouth daily. Patient not taking: Reported on 04/26/2020 01/04/20   Delfino LovettShah, Vipul, MD  aspirin EC 81 MG tablet Take 81 mg by mouth at bedtime.    [provider]  atorvastatin (LIPITOR) 40 MG tablet Take 40 mg by mouth daily. 12/04/19   [provider]  clopidogrel (PLAVIX) 75 MG tablet Take 1 tablet (75 mg total) by mouth every morning. Patient taking differently: Take 75 mg by mouth daily. 08/14/17   Enid BaasKalisetti, Radhika, MD  folic acid (FOLVITE) 1 MG tablet Take 1 mg by mouth daily. Patient not taking: Reported on 04/26/2020 03/28/20   [provider]  gabapentin (NEURONTIN) 100 MG capsule Take 100-200 mg by mouth daily as needed. Patient not taking: Reported on 04/26/2020    [provider]  glipiZIDE (GLUCOTROL XL) 5 MG 24 hr tablet Take 5 mg by mouth daily with breakfast.    [provider]  lamoTRIgine (LAMICTAL) 100 MG tablet Take 100 mg by mouth daily. 11/13/19   [provider]  levETIRAcetam (KEPPRA) 500 MG tablet Take 500 mg by mouth 2 (two) times daily. 02/29/20   [provider]  metFORMIN (GLUCOPHAGE) 1000 MG tablet Take 1 tablet (1,000 mg total) by mouth 2 (two) times daily. 01/30/16   Creig HinesBerge, Christopher Ronald, NP  methotrexate 2.5 MG tablet  03/28/20   [provider]  metoprolol succinate (TOPROL-XL) 25 MG 24 hr tablet Take 1 tablet (25 mg total) by mouth daily. Patient not taking: Reported on 05/28/2020 05/09/20 06/08/20  Hughie ClossPahwani, Ravi, MD  nitroGLYCERIN (NITROSTAT) 0.4 MG SL tablet Place 0.4 mg under the tongue every 5 (five) minutes as needed for chest pain.    [provider]  nystatin cream (MYCOSTATIN) Apply 1 application topically 2 (two) times daily as needed (yeast infection).  04/25/15   [provider]  ramipril (ALTACE) 5 MG capsule Take 5 mg by mouth daily. 08/14/20   [provider]  ranolazine (RANEXA) 500 MG 12 hr tablet Take 1 tablet (500 mg total) by mouth 2 (two) times daily. 08/14/17 04/26/20  Enid BaasKalisetti, Radhika, MD  SIMBRINZA 1-0.2 % SUSP Place 1 drop into both eyes 2 (two) times daily.  11/29/19   [provider]  timolol (TIMOPTIC) 0.5 % ophthalmic solution Place 1 drop into both eyes 2 (two) times daily. 06/14/17   [provider]  triamcinolone ointment (KENALOG) 0.1 % Apply 1 application topically 3 (three) times daily as  needed (itching from psoriasis).    [provider]  vitamin B-12 (CYANOCOBALAMIN) 1000 MCG tablet Take 500 mcg by mouth every morning.     [provider]  vitamin E 400 UNIT capsule Take 400 Units by mouth every morning.     [provider]  zinc sulfate 220 (50 Zn) MG capsule Take 1 capsule (220 mg total) by mouth daily. Patient not taking: Reported on 04/26/2020 01/04/20   Delfino Lovett, MD    Allergies  Allergen Reactions  . Adhesive [Tape] Other (See Comments)    Bruising and TEARING!!!  Please use "paper" tape  . Talwin [Pentazocine] Anaphylaxis and Swelling    Throat swelling  . Valium [Diazepam] Other (See Comments)    Makes her feel like she's flying  . Vicodin [Hydrocodone-Acetaminophen] Nausea And Vomiting  . Beta Adrenergic Blockers Other (See Comments)    Pt states she not allergic  . Levofloxacin Swelling and Other (See  Comments)    Tongue swells   . Simvastatin Swelling and Rash    Mouth swelling    Family History  Problem Relation Age of Onset  . Heart attack Father   . Diabetes Mother   . Cancer Mother   . Stroke Mother   . Breast cancer Cousin   . Breast cancer Cousin   . Breast cancer Cousin     Social History Social History   Tobacco Use  . Smoking status: Never Smoker  . Smokeless tobacco: Never Used  Substance Use Topics  . Alcohol use: No  . Drug use: No    Review of Systems Constitutional: Negative for fever. Cardiovascular: Negative for chest pain. Respiratory: Positive for shortness of breath.  Positive cough. Gastrointestinal: Negative for abdominal pain, vomiting Musculoskeletal: Bilateral lower extremity edema Skin: Negative for skin complaints  Neurological: Negative for headache All other ROS negative  ____________________________________________   PHYSICAL EXAM:  VITAL SIGNS: ED Triage Vitals  Enc Vitals Group     BP 10/07/20 1658 (!) 160/105     Pulse Rate 10/07/20 1658 93     Resp 10/07/20 1658 18     Temp 10/07/20 1658 (!) 97.5 F (36.4 C)     Temp Source 10/07/20 1658 Oral     SpO2 10/07/20 1658 100 %     Weight 10/07/20 1659 114 lb 10.2 oz (52 kg)     Height 10/07/20 1659 5\' 3"  (1.6 m)     Head Circumference --      Peak Flow --      Pain Score 10/07/20 1659 0     Pain Loc --      Pain Edu? --      Excl. in GC? --     Constitutional: Alert. Well appearing and in no distress. Eyes: Normal exam ENT      Head: Normocephalic and atraumatic.      Mouth/Throat: Mucous membranes are moist. Cardiovascular: Normal rate, regular rhythm.  Respiratory: Mild tachypnea but clear breath sounds bilaterally.  No wheeze rales or rhonchi Gastrointestinal: Soft and nontender. No distention.   Musculoskeletal: Patient has 1-2+ pitting edema to bilateral lower extremities with very slight tenderness to palpation. Neurologic:  Normal speech and language. No  gross focal neurologic deficits  Skin:  Skin is warm, dry and intact.  Psychiatric: Mood and affect are normal.  ____________________________________________    EKG  EKG viewed and interpreted by myself shows normal sinus rhythm at 94 bpm with a narrow QRS, normal axis, normal intervals, nonspecific but  no concerning ST changes.  ____________________________________________    RADIOLOGY  Chest x-ray shows small effusion but no obvious edema  ____________________________________________   INITIAL IMPRESSION / ASSESSMENT AND PLAN / ED COURSE  Pertinent labs & imaging results that were available during my care of the patient were reviewed by me and considered in my medical decision making (see chart for details).   Patient presents to the emergency department for shortness of breath worsening over the past week especially over the past 2 days along with lower extremity edema.  Differential would include CHF, pulmonary edema, pneumonia, COVID, ACS.  We will check labs, chest x-ray, COVID and continue to closely monitor.  Patient agreeable to plan of care.    Patient continues to sat around 94 to 95% on room air during my evaluation.  Son is now here with the patient states she has never had lower extreme edema previously into this past week.  New onset lower extremity edema that is now 2+ along with orthopnea and a BNP greater than 4500.  We will admit to the hospital service for IV diuresis.  Remainder the lab work is largely nonrevealing.  Patient's son agreeable to plan of care.  Leah Small was evaluated in Emergency Department on 10/07/2020 for the symptoms described in the history of present illness. She was evaluated in the context of the global COVID-19 pandemic, which necessitated consideration that the patient might be at risk for infection with the SARS-CoV-2 virus that causes COVID-19. Institutional protocols and algorithms that pertain to the evaluation of patients at risk  for COVID-19 are in a state of rapid change based on information released by regulatory bodies including the CDC and federal and state organizations. These policies and algorithms were followed during the patient's care in the ED.  ____________________________________________   FINAL CLINICAL IMPRESSION(S) / ED DIAGNOSES  Dyspnea CHF exacerbation   Minna Antis, MD 10/07/20 2027

## 2020-10-07 NOTE — ED Triage Notes (Signed)
Pt from home via ems, states that she has been worsening sob over the past week with some swelling to BLE. Pt denies pain.

## 2020-10-07 NOTE — H&P (Addendum)
History and Physical   Leah Small:[id number] DOB: [birth year] DOA: 10/07/2020  PCP: Marguarite Arbour, MD  Outpatient Specialists: [facility]. [facility] cardiology Patient coming from: home via EMS  I have personally briefly reviewed patient's old medical records in [facility] EMR.  Chief Concern: Shortness of breath  HPI: Leah Small is a 80 y.o. female with medical history significant for coronary artery disease status post CABG, valvular repair, CVA, hypertension, hyperlipidemia, non-insulin-dependent diabetes mellitus, GERD, seizures, TIA, neuropathy, presents to the emergency department for chief concerns of shortness of breath.  She endorses worsening shortness of breath for one week. She endorses bilateral lower feet swelling that started about one week ago and has worsened to have swelling up to her thighs over the last 1 day. She endorses the shortness of breath is worse with exertion. She reports decreased urination for about one week as well. She states the urge to urinate is present but when she urinates, only small amounts come out.   She endorses worsening cough for about two weeks. She produces grey mucus. She denies chest pain, nausea, vomiting. She endorses feeling heaviness in her chest for two weeks with exertion.  She denies no acute weight changes.  Over the last 3 to 5 years she has lost approximately 10 pounds.  However in the last week she is likely to gain a couple of pounds due to fluid retention.  Social history: She lives with her other son, his name is Leah Small and his girlfriend. She denies any history of tobacco, etoh, and recreational drug use. Formerly, she worked at [employer] for 23 years. She is retired now. She is widowed.   Dorinda Hill, her second son, is at bedside.   Vaccination: Patient is fully vaccinated and boosted for COVID-19  ROS: Constitutional: no weight change, no fever ENT/Mouth: no sore throat, no rhinorrhea Eyes: no eye pain, no  vision changes Cardiovascular: no chest pain, + dyspnea,  no edema, no palpitations Respiratory: + cough, + sputum, no wheezing Gastrointestinal: no nausea, no vomiting, no diarrhea, no constipation Genitourinary: no urinary incontinence, no dysuria, no hematuria Musculoskeletal: no arthralgias, no myalgias Skin: no skin lesions, no pruritus, Neuro: + weakness, no loss of consciousness, no syncope Psych: no anxiety, no depression, + decrease appetite Heme/Lymph: no bruising, no bleeding  ED Course: Discussed with ED provider, patient requiring hospitalization due to shortness of breath suspect secondary to heart failure.  Vitals in the emergency department was reassuring with temperatures of 97.5, respiration rate of 18, heart rate of 95, blood pressure 152/88, patient saturating at 100% on room air.  Labs in the emergency department was remarkable for serum sodium of 132, potassium of 6, bicarb of 21, BUN 35, serum creatinine of 1.21, glucose is 222.  BNP greater than 4500, troponin 18x2.  Hemoglobin of 10.6, hematocrit of 331, platelet of 126 which are at baseline for her.  ED provider ordered Lasix 60 mg IV once.  Assessment/Plan  Principal Problem:   Heart failure (HCC) Active Problems:   Stroke (HCC)   Diabetes mellitus with complication (HCC)   Essential hypertension   Hyperlipidemia   Memory deficit   Complex partial seizure disorder (HCC)   Coronary atherosclerosis of native coronary artery   Normocytic anemia   GERD (gastroesophageal reflux disease)   Malnutrition of moderate degree   Acute respiratory failure with hypoxia (HCC)   Anemia   Pancytopenia (HCC)   Shortness of breath-suspect secondary to heart failure exacerbation -Status post Lasix 60  mg IV per ED provider -Complete echo ordered -Lasix 40 mg IV twice daily -Strict I's and O's -Heart healthy/carb modified diet -A.m. team to consult cardiology  Acute kidney injury-suspect  cardiorenal Hyperkalemia-suspect secondary to acute kidney injury -Lasix, strict I's and O's -Albuterol 2 puffs every 4 hours while awake, 3 doses ordered -BMP in the a.m.  Noninsulin-dependent diabetes mellitus-holding home glipizide Hyperglycemia -Metformin resumed -Insulin SSI with at bedtime coverage  History of complex partial seizures-resumed home lamotrigine 100 mg daily, Keppra 500 mg p.o. twice daily  CAD-status post PCI and CABG history Angina chest pain -Resumed home Ranexa 500 mg p.o. twice daily, atorvastatin 40 mg daily, aspirin 81 mg daily -Nitroglycerin tablet sublingual, as needed for chest pain resumed  Chart reviewed.   Echo on 04/29/2020: EF was read as 20 to 25%, left ventricular diastolic parameters were normal.  Right ventricular systolic function was normal.  Normal pulmonary artery systolic pressure. Moderate mitral valve regurgitation.  Tricuspid valve regurgitation is moderate. No aortic stenosis present.  DVT prophylaxis: Enoxaparin Code Status: DNR Diet: Heart healthy/carb modified Family Communication: updated son, Dorinda Hill at bedside  Disposition Plan: Pending clinical course Consults called: None at this time recommend a.m. team to consult cardiology Admission status: Progressive cardiac observation with telemetry  Past Medical History:  Diagnosis Date  . Acid reflux   . Arthritis    "all over"  . Chronic stable angina (HCC)   . Coronary artery disease   . Hypercholesterolemia   . Hypertension   . Myocardial infarction (HCC) 1991; 07/06/2015  . NSTEMI (non-ST elevated myocardial infarction) (HCC) 10/22/2015  . Psoriatic arthritis (HCC)   . 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 TIA (transient ischemic attack)    "several over the last 20 years" (10/22/2015)  . Type II diabetes mellitus (HCC)    Past Surgical History:  Procedure Laterality Date  . ABDOMINAL HYSTERECTOMY  1973  .  APPENDECTOMY  1950s   elementary school  . CARDIAC CATHETERIZATION  X 2-3  . CARDIAC CATHETERIZATION N/A 11/21/2015   Procedure: Left Heart Cath and Coronary Angiography;  Surgeon: Corky Crafts, MD;  Location: [facility] INVASIVE CV LAB;  Service: Cardiovascular;  Laterality: N/A;  . CARDIAC CATHETERIZATION  11/21/2015   Procedure: Coronary Stent Intervention;  Surgeon: Corky Crafts, MD;  Location: [facility] INVASIVE CV LAB;  Service: Cardiovascular;;  . CARDIAC CATHETERIZATION N/A 01/29/2016   Procedure: Left Heart Cath and Cors/Grafts Angiography;  Surgeon: Tonny Bollman, MD;  Location: [facility] INVASIVE CV LAB;  Service: Cardiovascular;  Laterality: N/A;  . CARDIAC CATHETERIZATION N/A 07/22/2016   Procedure: Left Heart Cath and Coronary Angiography;  Surgeon: Iran Ouch, MD;  Location: [facility] INVASIVE CV LAB;  Service: Cardiovascular;  Laterality: N/A;  . CAROTID STENT INSERTION Bilateral 2011-2012   right-left  . CATARACT EXTRACTION W/ INTRAOCULAR LENS  IMPLANT, BILATERAL Bilateral 2009-2010  . CHOLECYSTECTOMY OPEN  ~ 2014  . CORONARY ANGIOPLASTY    . CORONARY ANGIOPLASTY WITH STENT PLACEMENT  11/21/2015  . CORONARY ARTERY BYPASS GRAFT  08/1995  . CORONARY/GRAFT ACUTE MI REVASCULARIZATION N/A 12/28/2019   Procedure: Coronary/Graft Acute MI Revascularization;  Surgeon: Alwyn Pea, MD;  Location: [facility] INVASIVE CV LAB;  Service: Cardiovascular;  Laterality: N/A;  . DILATION AND CURETTAGE OF UTERUS  X 1  . LEFT HEART CATH AND CORONARY ANGIOGRAPHY N/A 12/28/2019   Procedure: LEFT HEART CATH AND CORONARY ANGIOGRAPHY;  Surgeon: Alwyn Pea, MD;  Location: [facility] INVASIVE CV LAB;  Service: Cardiovascular;  Laterality: N/A;  . 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 TONSILLECTOMY  ~ 1947   2nd grade   Social History:  reports that she has never smoked. She has never used smokeless tobacco. She reports that she does not drink alcohol and does  not use drugs.  Allergies  Allergen Reactions  . Adhesive [Tape] Other (See Comments)    Bruising and TEARING!!!  Please use "paper" tape  . Talwin [Pentazocine] Anaphylaxis and Swelling    Throat swelling  . Valium [Diazepam] Other (See Comments)    Makes her feel like she's flying  . Vicodin [Hydrocodone-Acetaminophen] Nausea And Vomiting  . Beta Adrenergic Blockers Other (See Comments)    Pt states she not allergic  . Levofloxacin Swelling and Other (See Comments)    Tongue swells   . Simvastatin Swelling and Rash    Mouth swelling   Family History  Problem Relation Age of Onset  . Heart attack Father   . Diabetes Mother   . Cancer Mother   . Stroke Mother   . Breast cancer Cousin   . Breast cancer Cousin   . Breast cancer Cousin    Family history: Family history reviewed and not pertinent  Prior to Admission medications   Medication Sig Start Date End Date Taking? Authorizing Provider  ascorbic acid (VITAMIN C) 500 MG tablet Take 1 tablet (500 mg total) by mouth daily. 01/04/20  Yes Delfino Lovett, MD  aspirin EC 81 MG tablet Take 81 mg by mouth at bedtime.   Yes [provider]  atorvastatin (LIPITOR) 40 MG tablet Take 40 mg by mouth daily. 12/04/19  Yes [provider]  clopidogrel (PLAVIX) 75 MG tablet Take 1 tablet (75 mg total) by mouth every morning. Patient taking differently: Take 75 mg by mouth daily. 08/14/17  Yes Enid Baas, MD  folic acid (FOLVITE) 1 MG tablet Take 1 mg by mouth daily. 03/28/20  Yes [provider]  gabapentin (NEURONTIN) 100 MG capsule Take 100-200 mg by mouth daily as needed.   Yes [provider]  glipiZIDE (GLUCOTROL XL) 5 MG 24 hr tablet Take 5 mg by mouth daily with breakfast.   Yes [provider]  lamoTRIgine (LAMICTAL) 100 MG tablet Take 100 mg by mouth daily. 11/13/19  Yes [provider]  levETIRAcetam (KEPPRA) 500 MG tablet Take 500 mg by mouth 2 (two) times daily. 02/29/20  Yes  [provider]  metFORMIN (GLUCOPHAGE) 1000 MG tablet Take 1 tablet (1,000 mg total) by mouth 2 (two) times daily. 01/30/16  Yes Creig Hines, NP  methotrexate 2.5 MG tablet  03/28/20  Yes [provider]  nitroGLYCERIN (NITROSTAT) 0.4 MG SL tablet Place 0.4 mg under the tongue every 5 (five) minutes as needed for chest pain.   Yes [provider]  ramipril (ALTACE) 5 MG capsule Take 5 mg by mouth daily. 08/14/20  Yes [provider]  ranolazine (RANEXA) 500 MG 12 hr tablet Take 1 tablet (500 mg total) by mouth 2 (two) times daily. 08/14/17 04/26/20 Yes Enid Baas, MD  SIMBRINZA 1-0.2 % SUSP Place 1 drop into both eyes 2 (two) times daily.  11/29/19  Yes [provider]  timolol (TIMOPTIC) 0.5 % ophthalmic solution Place 1 drop into both eyes 2 (two) times daily. 06/14/17  Yes [provider]  vitamin B-12 (CYANOCOBALAMIN) 1000 MCG tablet Take 500 mcg by mouth  every morning.    Yes [provider]  vitamin E 400 UNIT capsule Take 400 Units by mouth every morning.    Yes [provider]  zinc sulfate 220 (50 Zn) MG capsule Take 1 capsule (220 mg total) by mouth daily. 01/04/20  Yes Delfino LovettShah, Vipul, MD  nystatin cream (MYCOSTATIN) Apply 1 application topically 2 (two) times daily as needed (yeast infection).  04/25/15   [provider]   Physical Exam: Vitals:   10/07/20 1658 10/07/20 1659 10/07/20 1827 10/07/20 1900  BP: (!) 160/105  (!) 155/102   Pulse: 93  92   Resp: 18  15   Temp: (!) 97.5 F (36.4 C)     TempSrc: Oral     SpO2: 100%  100% 100%  Weight:  52 kg    Height:  5\' 3"  (1.6 m)     Constitutional: appears, NAD, calm, comfortable Eyes: PERRL, lids and conjunctivae normal ENMT: Mucous membranes are moist. Posterior pharynx clear of any exudate or lesions. Age-appropriate dentition. Hearing appropriate Neck: normal, supple, no masses, no thyromegaly Respiratory: Left lower lung posterior lobe  decreased breath sounds.  Negative for wheezing.  Mild increase respiratory effort.  Increased accessory muscle use.  Cardiovascular: Regular rate and rhythm, no murmurs / rubs / gallops.  Bilateral 3+ pitting edema up to mid thigh. 2+ pedal pulses. No carotid bruits.  Abdomen: no tenderness, no masses palpated, no hepatosplenomegaly. Bowel sounds positive.  Musculoskeletal: no clubbing / cyanosis. No joint deformity upper and lower extremities. Good ROM, no contractures, no atrophy. Normal muscle tone.  Skin: no rashes, lesions, ulcers. No induration Neurologic: Sensation intact. Strength 5/5 in all 4.  Psychiatric: Normal judgment and insight. Alert and oriented x 3. Normal mood.   EKG: independently reviewed, showing sinus rhythm with rate of 94, QTc 491  Chest x-ray on Admission: I personally reviewed and I agree with radiologist reading as below.  DG Chest Portable 1 View  Result Date: 10/07/2020 CLINICAL DATA:  Short of breath EXAM: PORTABLE CHEST 1 VIEW COMPARISON:  04/26/2020, CT 05/04/2020 FINDINGS: Post sternotomy changes. Small left-sided pleural effusion with basilar airspace disease. Borderline to mild cardiomegaly. Aortic atherosclerosis. No pneumothorax. IMPRESSION: Small left pleural effusion with basilar airspace disease, atelectasis versus pneumonia. Electronically Signed   By: Jasmine PangKim  Fujinaga M.D.   On: 10/07/2020 17:45   Labs on Admission: I have personally reviewed following labs  CBC: Recent Labs  Lab 10/07/20 1756  WBC 6.5  HGB 10.6*  HCT 33.1*  MCV 113.0*  PLT 126*   Basic Metabolic Panel: Recent Labs  Lab 10/07/20 1756  NA 132*  K 6.0*  CL 102  CO2 21*  GLUCOSE 222*  BUN 35*  CREATININE 1.21*  CALCIUM 8.9   GFR: Estimated Creatinine Clearance: 30.4 mL/min (A) (by C-G formula based on SCr of 1.21 mg/dL (H)). Liver Function Tests: Recent Labs  Lab 10/07/20 1756  AST 21  ALT 13  ALKPHOS 63  BILITOT 1.1  PROT 7.8  ALBUMIN 3.7   Urine  analysis:    Component Value Date/Time   COLORURINE COLORLESS (A) 12/28/2019 1818   APPEARANCEUR CLEAR (A) 12/28/2019 1818   LABSPEC 1.014 12/28/2019 1818   PHURINE 6.0 12/28/2019 1818   GLUCOSEU 50 (A) 12/28/2019 1818   HGBUR NEGATIVE 12/28/2019 1818   BILIRUBINUR NEGATIVE 12/28/2019 1818   KETONESUR NEGATIVE 12/28/2019 1818   PROTEINUR NEGATIVE 12/28/2019 1818   UROBILINOGEN 0.2 07/06/2015 1558   NITRITE NEGATIVE 12/28/2019 1818   LEUKOCYTESUR NEGATIVE  12/28/2019 1818   Luismario Coston N Shevonne Wolf D.O. [facility]  If 7PM-7AM, please contact overnight-coverage provider If 7AM-7PM, please contact day coverage provider [email or web address]  10/07/2020, 9:41 PM

## 2020-10-08 ENCOUNTER — Other Ambulatory Visit: Payer: Medicare Other | Admitting: Adult Health Nurse Practitioner

## 2020-10-08 ENCOUNTER — Encounter: Payer: Self-pay | Admitting: Internal Medicine

## 2020-10-08 ENCOUNTER — Observation Stay
Admit: 2020-10-08 | Discharge: 2020-10-08 | Disposition: A | Discharge status: Home / Self Care | Payer: Medicare Other | Attending: Internal Medicine | Admitting: Internal Medicine

## 2020-10-08 DIAGNOSIS — L89150 Pressure ulcer of sacral region, unstageable: Secondary | ICD-10-CM

## 2020-10-08 DIAGNOSIS — I5023 Acute on chronic systolic (congestive) heart failure: Secondary | ICD-10-CM | POA: Diagnosis not present

## 2020-10-08 LAB — BASIC METABOLIC PANEL
Anion gap: 11 (ref 5–15)
BUN: 35 mg/dL — ABNORMAL HIGH (ref 8–23)
CO2: 20 mmol/L — ABNORMAL LOW (ref 22–32)
Calcium: 8.7 mg/dL — ABNORMAL LOW (ref 8.9–10.3)
Chloride: 102 mmol/L (ref 98–111)
Creatinine, Ser: 1.13 mg/dL — ABNORMAL HIGH (ref 0.44–1.00)
GFR, Estimated: 49 mL/min — ABNORMAL LOW (ref >60)
Glucose, Bld: 163 mg/dL — ABNORMAL HIGH (ref 70–99)
Potassium: 5 mmol/L (ref 3.5–5.1)
Sodium: 133 mmol/L — ABNORMAL LOW (ref 135–145)

## 2020-10-08 LAB — ECHOCARDIOGRAM COMPLETE
AR max vel: 1.5 cm2
AV Area VTI: 1.77 cm2
AV Area mean vel: 1.43 cm2
AV Mean grad: 2 mmHg
AV Peak grad: 3.5 mmHg
Ao pk vel: 0.94 m/s
Area-P 1/2: 5.62 cm2
Calc EF: 25 %
Height: 63 in
MV VTI: 1.11 cm2
S' Lateral: 4 cm
Single Plane A2C EF: 27.8 %
Single Plane A4C EF: 18.4 %
Weight: 2024 oz

## 2020-10-08 LAB — CBC
HCT: 32.6 % — ABNORMAL LOW (ref 36.0–46.0)
Hemoglobin: 10.7 g/dL — ABNORMAL LOW (ref 12.0–15.0)
MCH: 36.8 pg — ABNORMAL HIGH (ref 26.0–34.0)
MCHC: 32.8 g/dL (ref 30.0–36.0)
MCV: 112 fL — ABNORMAL HIGH (ref 80.0–100.0)
Platelets: 133 10*3/uL — ABNORMAL LOW (ref 150–400)
RBC: 2.91 MIL/uL — ABNORMAL LOW (ref 3.87–5.11)
RDW: 19 % — ABNORMAL HIGH (ref 11.5–15.5)
WBC: 4.4 10*3/uL (ref 4.0–10.5)
nRBC: 0 % (ref 0.0–0.2)

## 2020-10-08 LAB — CBG MONITORING, ED: Glucose-Capillary: 132 mg/dL — ABNORMAL HIGH (ref 70–99)

## 2020-10-08 LAB — GLUCOSE, CAPILLARY
Glucose-Capillary: 128 mg/dL — ABNORMAL HIGH (ref 70–99)
Glucose-Capillary: 199 mg/dL — ABNORMAL HIGH (ref 70–99)
Glucose-Capillary: 162 mg/dL — ABNORMAL HIGH (ref 70–99)
Glucose-Capillary: 127 mg/dL — ABNORMAL HIGH (ref 70–99)
Glucose-Capillary: 140 mg/dL — ABNORMAL HIGH (ref 70–99)

## 2020-10-08 LAB — TSH: TSH: 6.312 u[IU]/mL — ABNORMAL HIGH (ref 0.350–4.500)

## 2020-10-08 LAB — HEMOGLOBIN A1C
Hgb A1c MFr Bld: 5.7 % — ABNORMAL HIGH (ref 4.8–5.6)
Mean Plasma Glucose: 117 mg/dL

## 2020-10-08 MED ORDER — RAMIPRIL 5 MG PO CAPS
5.0000 mg | ORAL_CAPSULE | Freq: Every day | ORAL | Status: DC
Start: 2020-10-08 — End: 2020-10-10
  Administered 2020-10-08 – 2020-10-10 (×3): 5 mg via ORAL
  Filled 2020-10-08 (×3): qty 1

## 2020-10-08 NOTE — Progress Notes (Addendum)
[facility] Room 246 [employer] [facility]) Hospital Liaison RN note:  This patient is currently followed by out patient based palliative care with [facility]. Awanya Caesar, TOC is aware. [facility] Liaison will follow for disposition.  Thank you.  Cyndra Numbers, RN [facility] Liaison [phone]

## 2020-10-08 NOTE — Progress Notes (Signed)
PROGRESS NOTE    Leah Small  ZOX:[id number] DOB: 04-13-1941 DOA: 10/07/2020 PCP: Marguarite Arbour, MD    Brief Narrative: 80 y.o. female with medical history significant for coronary artery disease status post CABG, valvular repair, CVA, hypertension, hyperlipidemia, non-insulin-dependent diabetes mellitus, GERD, seizures, TIA, neuropathy, presents to the emergency department for chief concerns of shortness of breath.  She endorses worsening shortness of breath for one week. She endorses bilateral lower feet swelling that started about one week ago and has worsened to have swelling up to her thighs over the last 1 day. She endorses the shortness of breath is worse with exertion. She reports decreased urination for about one week as well. She states the urge to urinate is present but when she urinates, only small amounts come out.   Started on aggressive diuretic therapy for acute decompensated congestive heart failure.  Repeat echocardiogram ordered and pending.   Assessment & Plan:   Principal Problem:   Heart failure (HCC) Active Problems:   Stroke (HCC)   Diabetes mellitus with complication (HCC)   Essential hypertension   Hyperlipidemia   Memory deficit   Complex partial seizure disorder (HCC)   Coronary atherosclerosis of native coronary artery   Normocytic anemia   GERD (gastroesophageal reflux disease)   Malnutrition of moderate degree   Acute respiratory failure with hypoxia (HCC)   Anemia   Pancytopenia (HCC)   Unstageable pressure ulcer of sacral region (HCC)  Acute decompensated systolic congestive heart failure Presented with progressive dyspnea on exertion and lower extremity edema Clinical signs and symptoms consistent with acute decompensated heart failure Known ejection fraction  20 to 25% Confirmed on repeat echocardiogram Plan: Continue Lasix 40 mg IV twice daily Strict intake and output Target urine output 1-1.5 L daily net negative No  beta-blocker on home meds, will not start at this time Resume home ramipril Monitor electrolytes K> 4, Mg > 2 Consider cardiology consultation if patient not diuresing appropriately.  Will monitor for next 24 hours and assess urine output Patient is known to Dr. Gwen Pounds from [facility]  Acute kidney injury Hyperkalemia Suspect cardiorenal syndrome in setting of decompensated heart failure Suspect hyperkalemia related to AKI Plan: Lasix as above Daily BMP  Noninsulin-dependent diabetes mellitus with hyperglycemia Plan: holding home glipizide Metformin resumed Insulin SSI with at bedtime coverage  History of complex partial seizures resumed home lamotrigine 100 mg daily Keppra 500 mg p.o. twice daily  CAD status post PCI and CABG history -Resumed home Ranexa 500 mg p.o. twice daily, atorvastatin 40 mg daily, aspirin 81 mg daily -Nitroglycerin tablet sublingual, as needed for chest pain resumed    DVT prophylaxis: SQ Lovenox Code Status: DNR Family Communication: Lanier Ensign [phone] Disposition Plan: Status is: Observation  The patient will require care spanning > 2 midnights and should be moved to inpatient because: Inpatient level of care appropriate due to severity of illness  Dispo: The patient is from: Home              Anticipated d/c is to: Home              Anticipated d/c date is: 2 days              Patient currently is not medically stable to d/c.   Difficult to place patient No  Acute decompensated systolic congestive heart failure.  Anticipate 2 to 3 days of additional inpatient treatment and monitoring prior to disposition planning.     Level of care: Progressive  Cardiac  Consultants:   None  Procedures:   None  Antimicrobials:   None   Subjective: Patient seen and examined.  Reports improvement in dyspnea since admission.  No pain complaints.  Objective: Vitals:   10/08/20 0424 10/08/20 0437 10/08/20 0740 10/08/20 1300  BP:  (!) 157/82  (!) 156/94 138/67  Pulse: 93  98 90  Resp: Temp: 97.7 F (36.5 C)  97.7 F (36.5 C) 97.8 F (36.6 C)  TempSrc: Oral  Oral Oral  SpO2: 100%  100% 98%  Weight:  57.4 kg    Height:   (1.6 m)      Intake/Output Summary (Last 24 hours) at 10/08/2020 1405 Last data filed at 10/08/2020 1357 Gross per 24 hour  Intake 480 ml  Output 351 ml  Net 129 ml   Filed Weights   10/07/20 1659 10/08/20 0437  Weight: 52 kg 57.4 kg    Examination:  General exam: Appears calm and comfortable  Respiratory system: Scattered crackles bilaterally.  Normal work of breathing.  Room air Cardiovascular system: Distant heart sounds.  Regular rate and rhythm, no murmurs Gastrointestinal system: Abdomen is nondistended, soft and nontender. No organomegaly or masses felt. Normal bowel sounds heard. Central nervous system: Alert and oriented. No focal neurological deficits. Extremities: Symmetric 5 x 5 power.  3+ pitting edema bilateral lower extremities Skin: No rashes, lesions or ulcers Psychiatry: Judgement and insight appear normal. Mood & affect appropriate.     Data Reviewed: I have personally reviewed following labs and imaging studies  CBC: Recent Labs  Lab 10/07/20 1756 10/08/20 0529  WBC 6.5 4.4  HGB 10.6* 10.7*  HCT 33.1* 32.6*  MCV 113.0* 112.0*  PLT 126* 133*   Basic Metabolic Panel: Recent Labs  Lab 10/07/20 1756 10/08/20 0529  NA 132* 133*  K 6.0* 5.0  CL 102 102  CO2 21* 20*  GLUCOSE 222* 163*  BUN 35* 35*  CREATININE 1.21* 1.13*  CALCIUM 8.9 8.7*   GFR: Estimated Creatinine Clearance: 32.8 mL/min (A) (by C-G formula based on SCr of 1.13 mg/dL (H)). Liver Function Tests: Recent Labs  Lab 10/07/20 1756  AST 21  ALT 13  ALKPHOS 63  BILITOT 1.1  PROT 7.8  ALBUMIN 3.7   No results for input(s): LIPASE, AMYLASE in the last 168 hours. No results for input(s): AMMONIA in the last 168 hours. Coagulation Profile: No results for input(s):  INR, PROTIME in the last 168 hours. Cardiac Enzymes: No results for input(s): CKTOTAL, CKMB, CKMBINDEX, TROPONINI in the last 168 hours. BNP (last 3 results) No results for input(s): PROBNP in the last 8760 hours. HbA1C: No results for input(s): HGBA1C in the last 72 hours. CBG: Recent Labs  Lab 10/08/20 0005 10/08/20 0431 10/08/20 0902 10/08/20 1318  GLUCAP 132* 128* 199* 162*   Lipid Profile: No results for input(s): CHOL, HDL, LDLCALC, TRIG, CHOLHDL, LDLDIRECT in the last 72 hours. Thyroid Function Tests: Recent Labs    10/08/20 0529  TSH 6.312*   Anemia Panel: No results for input(s): VITAMINB12, FOLATE, FERRITIN, TIBC, IRON, RETICCTPCT in the last 72 hours. Sepsis Labs: No results for input(s): PROCALCITON, LATICACIDVEN in the last 168 hours.  Recent Results (from the past 240 hour(s))  Resp Panel by RT-PCR (Flu A&B, Covid) Nasopharyngeal Swab     Status: None   Collection Time: 10/07/20  5:58 PM   Specimen: Nasopharyngeal Swab; Nasopharyngeal(NP) swabs in vial transport medium  Result Value Ref Range Status  SARS Coronavirus 2 by RT PCR NEGATIVE NEGATIVE Final    Comment: (NOTE) SARS-CoV-2 target nucleic acids are NOT DETECTED.  The SARS-CoV-2 RNA is generally detectable in upper respiratory specimens during the acute phase of infection. The lowest concentration of SARS-CoV-2 viral copies this assay can detect is 138 copies/mL. A negative result does not preclude SARS-Cov-2 infection and should not be used as the sole basis for treatment or other patient management decisions. A negative result may occur with  improper specimen collection/handling, submission of specimen other than nasopharyngeal swab, presence of viral mutation(s) within the areas targeted by this assay, and inadequate number of viral copies(<138 copies/mL). A negative result must be combined with clinical observations, patient history, and epidemiological information. The expected result is  Negative.  Fact Sheet for Patients:  BloggerCourse.com  Fact Sheet for Healthcare Providers:  SeriousBroker.it  This test is no t yet approved or cleared by the [place] FDA and  has been authorized for detection and/or diagnosis of SARS-CoV-2 by FDA under an Emergency Use Authorization (EUA). This EUA will remain  in effect (meaning this test can be used) for the duration of the COVID-19 declaration under Section 564(b)(1) of the Act, 21 U.S.C.section 360bbb-3(b)(1), unless the authorization is terminated  or revoked sooner.       Influenza A by PCR NEGATIVE NEGATIVE Final   Influenza B by PCR NEGATIVE NEGATIVE Final    Comment: (NOTE) The Xpert Xpress SARS-CoV-2/FLU/RSV plus assay is intended as an aid in the diagnosis of influenza from Nasopharyngeal swab specimens and should not be used as a sole basis for treatment. Nasal washings and aspirates are unacceptable for Xpert Xpress SARS-CoV-2/FLU/RSV testing.  Fact Sheet for Patients: BloggerCourse.com  Fact Sheet for Healthcare Providers: SeriousBroker.it  This test is not yet approved or cleared by the [place] FDA and has been authorized for detection and/or diagnosis of SARS-CoV-2 by FDA under an Emergency Use Authorization (EUA). This EUA will remain in effect (meaning this test can be used) for the duration of the COVID-19 declaration under Section 564(b)(1) of the Act, 21 U.S.C. section 360bbb-3(b)(1), unless the authorization is terminated or revoked.  Performed at [facility], [address] [place], [place] [address]          Radiology Studies: DG Chest Portable 1 View  Result Date: 10/07/2020 CLINICAL DATA:  Short of breath EXAM: PORTABLE CHEST 1 VIEW COMPARISON:  04/26/2020, CT 05/04/2020 FINDINGS: Post sternotomy changes. Small left-sided pleural effusion with basilar airspace  disease. Borderline to mild cardiomegaly. Aortic atherosclerosis. No pneumothorax. IMPRESSION: Small left pleural effusion with basilar airspace disease, atelectasis versus pneumonia. Electronically Signed   By: Jasmine Pang M.D.   On: 10/07/2020 17:45   ECHOCARDIOGRAM COMPLETE  Result Date: 10/08/2020    ECHOCARDIOGRAM REPORT   Patient Name:   Leah Small Date of Exam: 10/08/2020 Medical Rec #:  [id number]       Height:       63.0 in Accession #:    [id number]      Weight:       126.5 lb Date of Birth:  12-20-1940       BSA:          1.592 m Patient Age:    79 years        BP:           156/94 mmHg Patient Gender: F               HR:  98 bpm. Exam Location:  [facility] Procedure: 2D Echo, Cardiac Doppler and Color Doppler Indications:     Dyspnea R06.00  History:         Patient has prior history of Echocardiogram examinations, most                  recent 04/29/2020. Previous Myocardial Infarction, TIA; Risk                  Factors:Diabetes.  Sonographer:     Cristela BlueJerry Hege RDCS (AE) Referring Phys:  [id number] AMY N COX Diagnosing Phys: Marcina MillardAlexander Paraschos MD IMPRESSIONS  1. Left ventricular ejection fraction, by estimation, is 20 to 25%. The left ventricle has severely decreased function. The left ventricle demonstrates global hypokinesis. The left ventricular internal cavity size was moderately dilated. Left ventricular diastolic parameters were normal.  2. Right ventricular systolic function is normal. The right ventricular size is normal.  3. Large pleural effusion in the left lateral region.  4. The mitral valve is normal in structure. Moderate to severe mitral valve regurgitation. No evidence of mitral stenosis.  5. Tricuspid valve regurgitation is moderate to severe.  6. The aortic valve is normal in structure. Aortic valve regurgitation is not visualized. No aortic stenosis is present.  7. The inferior vena cava is normal in size with greater than 50% respiratory variability, suggesting right atrial  pressure of 3 mmHg. FINDINGS  Left Ventricle: Left ventricular ejection fraction, by estimation, is 20 to 25%. The left ventricle has severely decreased function. The left ventricle demonstrates global hypokinesis. The left ventricular internal cavity size was moderately dilated. There is no left ventricular hypertrophy. Left ventricular diastolic parameters were normal. Right Ventricle: The right ventricular size is normal. No increase in right ventricular wall thickness. Right ventricular systolic function is normal. Left Atrium: Left atrial size was normal in size. Right Atrium: Right atrial size was normal in size. Pericardium: There is no evidence of pericardial effusion. Mitral Valve: The mitral valve is normal in structure. Moderate to severe mitral valve regurgitation. No evidence of mitral valve stenosis. MV peak gradient, 5.3 mmHg. The mean mitral valve gradient is 2.0 mmHg. Tricuspid Valve: The tricuspid valve is normal in structure. Tricuspid valve regurgitation is moderate to severe. No evidence of tricuspid stenosis. Aortic Valve: The aortic valve is normal in structure. Aortic valve regurgitation is not visualized. No aortic stenosis is present. Aortic valve mean gradient measures 2.0 mmHg. Aortic valve peak gradient measures 3.5 mmHg. Aortic valve area, by VTI measures 1.77 cm. Pulmonic Valve: The pulmonic valve was normal in structure. Pulmonic valve regurgitation is not visualized. No evidence of pulmonic stenosis. Aorta: The aortic root is normal in size and structure. Venous: The inferior vena cava is normal in size with greater than 50% respiratory variability, suggesting right atrial pressure of 3 mmHg. IAS/Shunts: No atrial level shunt detected by color flow Doppler. Additional Comments: There is a large pleural effusion in the left lateral region.  LEFT VENTRICLE PLAX 2D LVIDd:         4.38 cm LVIDs:         4.00 cm LV PW:         1.48 cm LV IVS:        0.77 cm LVOT diam:     2.00 cm LV SV:          22 LV SV Index:   14 LVOT Area:     3.14 cm  LV Volumes (MOD) LV vol d, MOD  A2C: 107.0 ml LV vol d, MOD A4C: 72.3 ml LV vol s, MOD A2C: 77.3 ml LV vol s, MOD A4C: 59.0 ml LV SV MOD A2C:     29.7 ml LV SV MOD A4C:     72.3 ml LV SV MOD BP:      22.9 ml RIGHT VENTRICLE RV Basal diam:  5.19 cm RV S prime:     6.96 cm/s TAPSE (M-mode): 3.6 cm LEFT ATRIUM             Index       RIGHT ATRIUM           Index LA diam:        2.90 cm 1.82 cm/m  RA Area:     18.70 cm LA Vol (A2C):   84.8 ml 53.28 ml/m RA Volume:   55.90 ml  35.12 ml/m LA Vol (A4C):   51.5 ml 32.36 ml/m LA Biplane Vol: 68.0 ml 42.73 ml/m  AORTIC VALVE                   PULMONIC VALVE AV Area (Vmax):    1.50 cm    PV Vmax:        0.50 m/s AV Area (Vmean):   1.43 cm    PV Peak grad:   1.0 mmHg AV Area (VTI):     1.77 cm    RVOT Peak grad: 1 mmHg AV Vmax:           94.20 cm/s AV Vmean:          65.267 cm/s AV VTI:            0.122 m AV Peak Grad:      3.5 mmHg AV Mean Grad:      2.0 mmHg LVOT Vmax:         45.00 cm/s LVOT Vmean:        29.800 cm/s LVOT VTI:          0.069 m LVOT/AV VTI ratio: 0.56  AORTA Ao Root diam: 2.50 cm MITRAL VALVE               TRICUSPID VALVE MV Area (PHT): 5.62 cm    TR Peak grad:   46.5 mmHg MV Area VTI:   1.11 cm    TR Vmax:        341.00 cm/s MV Peak grad:  5.3 mmHg MV Mean grad:  2.0 mmHg    SHUNTS MV Vmax:       1.15 m/s    Systemic VTI:  0.07 m MV Vmean:      67.6 cm/s   Systemic Diam: 2.00 cm MV Decel Time: 135 msec MV E velocity: 94.30 cm/s MV A velocity: 58.70 cm/s MV E/A ratio:  1.61 Marcina Millard MD Electronically signed by Marcina Millard MD Signature Date/Time: 10/08/2020/1:18:22 PM    Final         Scheduled Meds: . albuterol  2.5 mg Inhalation Q4H while awake  . ascorbic acid  500 mg Oral Daily  . aspirin EC  81 mg Oral QHS  . atorvastatin  40 mg Oral Daily  . clopidogrel  75 mg Oral Daily  . enoxaparin (LOVENOX) injection  40 mg Subcutaneous Q24H  . folic acid  1 mg Oral Daily   . furosemide  40 mg Intravenous BID  . insulin aspart  0-5 Units Subcutaneous QHS  . insulin aspart  0-9 Units Subcutaneous TID WC  . lamoTRIgine  100 mg  Oral Daily  . levETIRAcetam  500 mg Oral BID  . ranolazine  500 mg Oral BID  . timolol  1 drop Both Eyes BID  . vitamin B-12  500 mcg Oral BH-q7a  . zinc sulfate  220 mg Oral Daily   Continuous Infusions:   LOS: 0 days    Time spent: 25 minutes    Tresa Moore, MD [facility] Pager [phone]  If 7PM-7AM, please contact night-coverage 10/08/2020, 2:05 PM

## 2020-10-08 NOTE — Progress Notes (Signed)
*  PRELIMINARY RESULTS* Echocardiogram 2D Echocardiogram has been performed.  Leah Small 10/08/2020, 12:14 PM

## 2020-10-08 NOTE — ED Notes (Signed)
Pt family contact:   Dorinda Hill (son) [phone]

## 2020-10-09 ENCOUNTER — Observation Stay: Payer: Medicare Other

## 2020-10-09 DIAGNOSIS — E7800 Pure hypercholesterolemia, unspecified: Secondary | ICD-10-CM | POA: Diagnosis present

## 2020-10-09 DIAGNOSIS — E1165 Type 2 diabetes mellitus with hyperglycemia: Secondary | ICD-10-CM | POA: Diagnosis present

## 2020-10-09 DIAGNOSIS — E1140 Type 2 diabetes mellitus with diabetic neuropathy, unspecified: Secondary | ICD-10-CM | POA: Diagnosis present

## 2020-10-09 DIAGNOSIS — I420 Dilated cardiomyopathy: Secondary | ICD-10-CM | POA: Diagnosis present

## 2020-10-09 DIAGNOSIS — G40209 Localization-related (focal) (partial) symptomatic epilepsy and epileptic syndromes with complex partial seizures, not intractable, without status epilepticus: Secondary | ICD-10-CM | POA: Diagnosis present

## 2020-10-09 DIAGNOSIS — E875 Hyperkalemia: Secondary | ICD-10-CM | POA: Diagnosis present

## 2020-10-09 DIAGNOSIS — E43 Unspecified severe protein-calorie malnutrition: Secondary | ICD-10-CM | POA: Insufficient documentation

## 2020-10-09 DIAGNOSIS — Z6821 Body mass index (BMI) 21.0-21.9, adult: Secondary | ICD-10-CM | POA: Diagnosis not present

## 2020-10-09 DIAGNOSIS — L4050 Arthropathic psoriasis, unspecified: Secondary | ICD-10-CM | POA: Diagnosis present

## 2020-10-09 DIAGNOSIS — K219 Gastro-esophageal reflux disease without esophagitis: Secondary | ICD-10-CM | POA: Diagnosis present

## 2020-10-09 DIAGNOSIS — I081 Rheumatic disorders of both mitral and tricuspid valves: Secondary | ICD-10-CM | POA: Diagnosis present

## 2020-10-09 DIAGNOSIS — R0602 Shortness of breath: Secondary | ICD-10-CM | POA: Diagnosis present

## 2020-10-09 DIAGNOSIS — I5023 Acute on chronic systolic (congestive) heart failure: Secondary | ICD-10-CM | POA: Diagnosis present

## 2020-10-09 DIAGNOSIS — I25118 Atherosclerotic heart disease of native coronary artery with other forms of angina pectoris: Secondary | ICD-10-CM | POA: Diagnosis present

## 2020-10-09 DIAGNOSIS — D61818 Other pancytopenia: Secondary | ICD-10-CM | POA: Diagnosis present

## 2020-10-09 DIAGNOSIS — J918 Pleural effusion in other conditions classified elsewhere: Secondary | ICD-10-CM | POA: Diagnosis present

## 2020-10-09 DIAGNOSIS — L89152 Pressure ulcer of sacral region, stage 2: Secondary | ICD-10-CM | POA: Diagnosis present

## 2020-10-09 DIAGNOSIS — J9601 Acute respiratory failure with hypoxia: Secondary | ICD-10-CM | POA: Diagnosis present

## 2020-10-09 DIAGNOSIS — I110 Hypertensive heart disease with heart failure: Secondary | ICD-10-CM | POA: Diagnosis present

## 2020-10-09 DIAGNOSIS — Z20822 Contact with and (suspected) exposure to covid-19: Secondary | ICD-10-CM | POA: Diagnosis present

## 2020-10-09 DIAGNOSIS — Z66 Do not resuscitate: Secondary | ICD-10-CM | POA: Diagnosis present

## 2020-10-09 DIAGNOSIS — N179 Acute kidney failure, unspecified: Secondary | ICD-10-CM | POA: Diagnosis present

## 2020-10-09 DIAGNOSIS — E785 Hyperlipidemia, unspecified: Secondary | ICD-10-CM | POA: Diagnosis present

## 2020-10-09 DIAGNOSIS — D638 Anemia in other chronic diseases classified elsewhere: Secondary | ICD-10-CM | POA: Diagnosis present

## 2020-10-09 LAB — CBC WITH DIFFERENTIAL/PLATELET
Abs Immature Granulocytes: 0.01 10*3/uL (ref 0.00–0.07)
Basophils Absolute: 0 10*3/uL (ref 0.0–0.1)
Basophils Relative: 1 %
Eosinophils Absolute: 0.2 10*3/uL (ref 0.0–0.5)
Eosinophils Relative: 4 %
HCT: 29.2 % — ABNORMAL LOW (ref 36.0–46.0)
Hemoglobin: 9.5 g/dL — ABNORMAL LOW (ref 12.0–15.0)
Immature Granulocytes: 0 %
Lymphocytes Relative: 32 %
Lymphs Abs: 1.1 10*3/uL (ref 0.7–4.0)
MCH: 35.8 pg — ABNORMAL HIGH (ref 26.0–34.0)
MCHC: 32.5 g/dL (ref 30.0–36.0)
MCV: 110.2 fL — ABNORMAL HIGH (ref 80.0–100.0)
Monocytes Absolute: 0.3 10*3/uL (ref 0.1–1.0)
Monocytes Relative: 8 %
Neutro Abs: 1.9 10*3/uL (ref 1.7–7.7)
Neutrophils Relative %: 55 %
Platelets: 112 10*3/uL — ABNORMAL LOW (ref 150–400)
RBC: 2.65 MIL/uL — ABNORMAL LOW (ref 3.87–5.11)
RDW: 18.3 % — ABNORMAL HIGH (ref 11.5–15.5)
WBC: 3.5 10*3/uL — ABNORMAL LOW (ref 4.0–10.5)
nRBC: 0 % (ref 0.0–0.2)

## 2020-10-09 LAB — BODY FLUID CELL COUNT WITH DIFFERENTIAL
Eos, Fluid: 0 %
Lymphs, Fluid: 35 %
Monocyte-Macrophage-Serous Fluid: 37 %
Neutrophil Count, Fluid: 28 %
Total Nucleated Cell Count, Fluid: 72 cu mm

## 2020-10-09 LAB — BASIC METABOLIC PANEL
Anion gap: 10 (ref 5–15)
BUN: 32 mg/dL — ABNORMAL HIGH (ref 8–23)
CO2: 23 mmol/L (ref 22–32)
Calcium: 8.4 mg/dL — ABNORMAL LOW (ref 8.9–10.3)
Chloride: 101 mmol/L (ref 98–111)
Creatinine, Ser: 1.02 mg/dL — ABNORMAL HIGH (ref 0.44–1.00)
GFR, Estimated: 56 mL/min — ABNORMAL LOW (ref >60)
Glucose, Bld: 117 mg/dL — ABNORMAL HIGH (ref 70–99)
Potassium: 3.8 mmol/L (ref 3.5–5.1)
Sodium: 134 mmol/L — ABNORMAL LOW (ref 135–145)

## 2020-10-09 LAB — MAGNESIUM: Magnesium: 1.1 mg/dL — ABNORMAL LOW (ref 1.7–2.4)

## 2020-10-09 LAB — LACTATE DEHYDROGENASE, PLEURAL OR PERITONEAL FLUID: LD, Fluid: 55 U/L — ABNORMAL HIGH (ref 3–23)

## 2020-10-09 LAB — GLUCOSE, CAPILLARY
Glucose-Capillary: 146 mg/dL — ABNORMAL HIGH (ref 70–99)
Glucose-Capillary: 249 mg/dL — ABNORMAL HIGH (ref 70–99)
Glucose-Capillary: 177 mg/dL — ABNORMAL HIGH (ref 70–99)
Glucose-Capillary: 181 mg/dL — ABNORMAL HIGH (ref 70–99)

## 2020-10-09 LAB — PROTEIN, PLEURAL OR PERITONEAL FLUID: Total protein, fluid: <3 g/dL

## 2020-10-09 MED ORDER — MAGNESIUM SULFATE 2 GM/50ML IV SOLN
2.0000 g | Freq: Once | INTRAVENOUS | Status: AC
  Administered 2020-10-09: 2 g via INTRAVENOUS
  Filled 2020-10-09: qty 50

## 2020-10-09 MED ORDER — ENSURE ENLIVE PO LIQD
237.0000 mL | Freq: Two times a day (BID) | ORAL | Status: DC
  Administered 2020-10-10 – 2020-10-12 (×4): 237 mL via ORAL

## 2020-10-09 MED ORDER — ADULT MULTIVITAMIN W/MINERALS CH
1.0000 | ORAL_TABLET | Freq: Every day | ORAL | Status: DC
  Filled 2020-10-09 (×4): qty 1

## 2020-10-09 MED ORDER — POTASSIUM CHLORIDE CRYS ER 20 MEQ PO TBCR
20.0000 meq | EXTENDED_RELEASE_TABLET | Freq: Every day | ORAL | Status: DC
  Administered 2020-10-09: 20 meq via ORAL
  Filled 2020-10-09 (×5): qty 1

## 2020-10-09 MED ORDER — BRIMONIDINE TARTRATE 0.2 % OP SOLN
1.0000 [drp] | Freq: Two times a day (BID) | OPHTHALMIC | Status: DC
  Administered 2020-10-09 – 2020-10-13 (×9): 1 [drp] via OPHTHALMIC
  Filled 2020-10-09: qty 5

## 2020-10-09 MED ORDER — FUROSEMIDE 10 MG/ML IJ SOLN
60.0000 mg | Freq: Two times a day (BID) | INTRAMUSCULAR | Status: DC
  Administered 2020-10-09 (×2): 60 mg via INTRAVENOUS
  Filled 2020-10-09 (×2): qty 6

## 2020-10-09 MED ORDER — BRINZOLAMIDE 1 % OP SUSP
1.0000 [drp] | Freq: Two times a day (BID) | OPHTHALMIC | Status: DC
  Administered 2020-10-09 – 2020-10-13 (×9): 1 [drp] via OPHTHALMIC
  Filled 2020-10-09: qty 10

## 2020-10-09 NOTE — Progress Notes (Signed)
Initial Nutrition Assessment  DOCUMENTATION CODES:   Severe malnutrition in context of chronic illness  INTERVENTION:  Provide Ensure Enlive po BID, each supplement provides 350 kcal and 20 grams of protein.  Provide MVI po daily.  RD provided "Heart Failure Nutrition Therapy for the Undernourished" handout from the Academy of Nutrition and Dietetics. Reviewed patient's dietary recall. Encouraged intake of small, frequent meals throughout the day. Encouraged adequate intake of calories and protein and reviewed ways to increase calorie and protein intake. Provided examples on ways to decrease sodium intake in diet. Teach back method used. Expect good compliance.  NUTRITION DIAGNOSIS:   Severe Malnutrition related to chronic illness (CHF) as evidenced by severe fat depletion,severe muscle depletion.  GOAL:   Patient will meet greater than or equal to 90% of their needs  MONITOR:   PO intake,Supplement acceptance,Labs,Weight trends,I & O's  REASON FOR ASSESSMENT:   Consult Assessment of nutrition requirement/status  ASSESSMENT:   80 year old female with PMHx of CAD, acid reflux, HTN, DM, seizures, TIA, NSTEMI, CHF admitted with acute decompensated systolic CHF, pleural effusion s/p left thoracentesis 2/16 that removed 475 mL, AKI.   Received verbal consult from heart failure nurse navigator that patient would benefit from nutrition assessment. Patient with decreased appetite and intake. She was recently told to drink Gatorade and Pedialyte at oncology appointment and was not aware they are high in sodium.  Met with patient at bedside. Patient reports she has had a poor appetite for approximately one year now. She is unsure what led to onset of poor appetite. She is experiencing taste changes and loss of interest in food. She lives with her son and his girlfriend. She can no longer cook full meals but is able to prepare some simple meals with the microwave. She reports she does not  eat any "full" meals anymore. She may have some small snacks throughout the day. She enjoys fruit, frozen meals, sweet and sour chicken, and peanut butter crackers. She may drink one Ensure per day and has also been drinking some Gatorade and electrolyte replacement drinks. She was unaware that they had sodium in them. She reports she was told to monitor sodium intake but has never received full education. RD provided education on heart failure nutrition therapy for the undernourished. Encouraged adequate intake of calories and protein to meet increased needs. Patient is amenable to drinking two bottles of Ensure per day.  Patient reports her dry weight is 110-114 lbs. She reports she began experiencing fluid accumulation on 2/11 or 2/12. Patient currently documented to be 57.2 kg (126.1 lbs).  Medications reviewed and include: vitamin C 440 mg daily, folic acid 1 mg daily, Lasix 60 mg BID IV, Novolog 0-9 units TID, Novolog 0-5 units QHS, Keppra, potassium chloride 20 mEq daily, vitamin B12 500 micrograms daily, zinc sulfate 220 mg daily.  Labs reviewed: CBG 146-249, Sodium 134, BUN 32, Creatinine 1.02, Magnesium 1.1.  RN present in room during part of RD assessment.  NUTRITION - FOCUSED PHYSICAL EXAM:  Flowsheet Row Most Recent Value  Orbital Region Severe depletion  Upper Arm Region Severe depletion  Thoracic and Lumbar Region Moderate depletion  Buccal Region Severe depletion  Temple Region Severe depletion  Clavicle Bone Region Severe depletion  Clavicle and Acromion Bone Region Severe depletion  Scapular Bone Region Unable to assess  Dorsal Hand Severe depletion  Patellar Region Severe depletion  Anterior Thigh Region Severe depletion  Posterior Calf Region Severe depletion  Edema (RD Assessment) Mild  Hair  Reviewed  Eyes Reviewed  Mouth Reviewed  Skin Reviewed  Nails Reviewed     Diet Order:   Diet Order            Diet heart healthy/carb modified Room service appropriate?  Yes; Fluid consistency: Thin  Diet effective now                EDUCATION NEEDS:   Education needs have been addressed  Skin:  Skin Assessment: Skin Integrity Issues: Skin Integrity Issues:: Stage II Stage II: sacrum  Last BM:  10/06/2020 per chart  Height:   Ht Readings from Last 1 Encounters:  10/08/20 $RemoveB'5\' 3"'[id number]$  (1.6 m)   Weight:   Wt Readings from Last 1 Encounters:  10/09/20 57.2 kg   BMI:  Body mass index is 22.34 kg/m.  Estimated Nutritional Needs:   Kcal:  1600-1800  Protein:  80-90 grams  Fluid:  1.3-1.5 L/day  Jacklynn Barnacle, MS, RD, LDN Pager number available on Amion

## 2020-10-09 NOTE — Progress Notes (Signed)
[facility] PROGRESS NOTE    Leah Small  HMC:[id number] DOB: Jun 10, 1941 DOA: 10/07/2020 PCP: Marguarite Arbour, MD      Brief Narrative:  Leah Small is a 80 y.o. F with CAD s/p CABG, last PCI 2017, hx CVA, HTN, DM and seizures who presented with shortness of breath for 1 week, leg swelling, and DOE.  In the ER, she was started on diuretics for CHF.               Assessment & Plan:  Acute on chronic systolic CHF Moderate to severe TR and MR Symptoms have gradually worsened since her MI last year, and for the last 6 months at least, she can only walk from one room to the next, stopping in between.  Net negative 1.1 L yesterday, creatinine and potassium stable  Echo yesterday shows EF 20-25% still. -Increase Furosemide to 60 mg IV twice a day  -K supplement -Strict I/Os, daily weights, telemetry  -Daily monitoring renal function     Pleural effusion -Obtain US thoracentesis, serum LDH, and pleural fluid chemistries  AKI ruled out Mild congestive nephropathy, improving with diruesis  Diabetes with neuropathy -Continue sliding scale corrections -Hold Metformin, glipizide -Continue gabapentin  Complex partial seizures No seizure activity here -Continue Keppra, Lamictal  Coronary disease, secondary prevention Hypertension Blood pressure slightly high -Continue ramipril -Continue ranolazine -Continue aspirin, atorvastatin, Plavix  Sacral pressure injury, stage II, present on admission  Severe protein calorie malnutrition As evidenced by weight loss down to 57 kg over the last year, reported poor oral intake, and severe loss of subcutaneous muscle mass and fat, as well as pressure injury.  Hypomagnesemia -Supplement magnesium  Anemia, normocytic, of chronic disease Most recent iron studies have been replete  Other medications -Hold methotrexate for now            Disposition: Status is: Inpatient  Remains  inpatient appropriate because:IV treatments appropriate due to intensity of illness or inability to take PO   Dispo: The patient is from: Home              Anticipated d/c is to: SNF              Anticipated d/c date is: 2 days              Patient currently is not medically stable to d/c.   Difficult to place patient No    Present with cardiomegaly and bilateral pleural effusions on chest x-ray as well as lower extremity edema and dyspnea on exertion, over baseline.  Despite IV Lasix, she is still significantly dyspneic with minimal exertion, and still has pitting edema and fluid overload on exam.         Level of care: Med-Surg       MDM: The below labs and imaging reports were reviewed and summarized above.  Medication management as above.  This is a severe exacerbation of her chronic illness.   DVT prophylaxis: enoxaparin (LOVENOX) injection 40 mg Start: 10/07/20 2100 Place TED hose Start: 10/07/20 2049  Code Status: DNR Family Communication: Son Leah Small by phone    Procedures:   2/15 echocardiogram -- EF still 20-25%, mod-severe MR and TR           Subjective: Still very out of breath.  Still swollen.  No confusion, fever, sputum.        Objective: Vitals:   10/09/20 0900 10/09/20 1100 10/09/20 1202 10/09/20 1609  BP: (!) 158/88 (!) 147/95 [name])  142/83 (!) 143/83  Pulse:  77 92 94  Resp:   15 15  Temp:   97.7 F (36.5 C) (!) 97.5 F (36.4 C)  TempSrc:    Oral  SpO2: 100% 93% 100% 100%  Weight:      Height:        Intake/Output Summary (Last 24 hours) at 10/09/2020 1657 Last data filed at 10/09/2020 1516 Gross per 24 hour  Intake 1020 ml  Output 3125 ml  Net -2105 ml   Filed Weights   10/07/20 1659 10/08/20 0437 10/09/20 0500  Weight: 52 kg 57.4 kg 57.2 kg    Examination: General appearance: Frail elderly adult female, alert and in no obvious distress at rest.  Sitting in bed, interactive.   HEENT: Anicteric, conjunctiva pink,  lids and lashes normal. No nasal deformity, discharge, epistaxis.  Lips moist, no dentures, good dentition, OP moist.   Skin: Warm and dry.  No jaundice.  No suspicious rashes or lesions.  Chronic venous changes to legs. Cardiac: RRR, nl S1-S2, soft murmurs noted, systolic.  Capillary refill is brisk.  JVP not visible.  2+ LE edema.  Radial pulses 1+ and symmetric. Respiratory: Normal respiratory rate and rhythm.  Crackles at left base more than right. Abdomen: Abdomen soft.  No TTP or gaurding. No ascites, distension, hepatosplenomegaly.   MSK: No deformities or effusions. Severe loss of subcutaneous muscle mass and fat. Neuro: Awake and alert.  EOMI, moves all extremities with generalized weakness. Speech fluent.    Psych: Sensorium intact and responding to questions, attention normal. Affect normal.  Judgment and insight appear normal.    Data Reviewed: I have personally reviewed following labs and imaging studies:  CBC: Recent Labs  Lab 10/07/20 1756 10/08/20 0529 10/09/20 0512  WBC 6.5 4.4 3.5*  NEUTROABS  --   --  1.9  HGB 10.6* 10.7* 9.5*  HCT 33.1* 32.6* 29.2*  MCV 113.0* 112.0* 110.2*  PLT 126* 133* 112*   Basic Metabolic Panel: Recent Labs  Lab 10/07/20 1756 10/08/20 0529 10/09/20 0512  NA 132* 133* 134*  K 6.0* 5.0 3.8  CL 102 102 101  CO2 21* 20* 23  GLUCOSE 222* 163* 117*  BUN 35* 35* 32*  CREATININE 1.21* 1.13* 1.02*  CALCIUM 8.9 8.7* 8.4*  MG  --   --  1.1*   GFR: Estimated Creatinine Clearance: 36.4 mL/min (A) (by C-G formula based on SCr of 1.02 mg/dL (H)). Liver Function Tests: Recent Labs  Lab 10/07/20 1756  AST 21  ALT 13  ALKPHOS 63  BILITOT 1.1  PROT 7.8  ALBUMIN 3.7   No results for input(s): LIPASE, AMYLASE in the last 168 hours. No results for input(s): AMMONIA in the last 168 hours. Coagulation Profile: No results for input(s): INR, PROTIME in the last 168 hours. Cardiac Enzymes: No results for input(s): CKTOTAL, CKMB, CKMBINDEX,  TROPONINI in the last 168 hours. BNP (last 3 results) No results for input(s): PROBNP in the last 8760 hours. HbA1C: Recent Labs    10/08/20 0008  HGBA1C 5.7*   CBG: Recent Labs  Lab 10/08/20 1729 10/08/20 2116 10/09/20 0748 10/09/20 1201 10/09/20 1604  GLUCAP 127* 140* 146* 249* 177*   Lipid Profile: No results for input(s): CHOL, HDL, LDLCALC, TRIG, CHOLHDL, LDLDIRECT in the last 72 hours. Thyroid Function Tests: Recent Labs    10/08/20 0529  TSH 6.312*   Anemia Panel: No results for input(s): VITAMINB12, FOLATE, FERRITIN, TIBC, IRON, RETICCTPCT in the last 72 hours. Urine analysis:  Component Value Date/Time   COLORURINE COLORLESS (A) 12/28/2019 1818   APPEARANCEUR CLEAR (A) 12/28/2019 1818   LABSPEC 1.014 12/28/2019 1818   PHURINE 6.0 12/28/2019 1818   GLUCOSEU 50 (A) 12/28/2019 1818   HGBUR NEGATIVE 12/28/2019 1818   BILIRUBINUR NEGATIVE 12/28/2019 1818   KETONESUR NEGATIVE 12/28/2019 1818   PROTEINUR NEGATIVE 12/28/2019 1818   UROBILINOGEN 0.2 07/06/2015 1558   NITRITE NEGATIVE 12/28/2019 1818   LEUKOCYTESUR NEGATIVE 12/28/2019 1818   Sepsis Labs: (procalcitonin:4,lacticacidven:4)  ) Recent Results (from the past 240 hour(s))  Resp Panel by RT-PCR (Flu A&B, Covid) Nasopharyngeal Swab     Status: None   Collection Time: 10/07/20  5:58 PM   Specimen: Nasopharyngeal Swab; Nasopharyngeal(NP) swabs in vial transport medium  Result Value Ref Range Status   SARS Coronavirus 2 by RT PCR NEGATIVE NEGATIVE Final    Comment: (NOTE) SARS-CoV-2 target nucleic acids are NOT DETECTED.  The SARS-CoV-2 RNA is generally detectable in upper respiratory specimens during the acute phase of infection. The lowest concentration of SARS-CoV-2 viral copies this assay can detect is 138 copies/mL. A negative result does not preclude SARS-Cov-2 infection and should not be used as the sole basis for treatment or other patient management decisions. A negative result  may occur with  improper specimen collection/handling, submission of specimen other than nasopharyngeal swab, presence of viral mutation(s) within the areas targeted by this assay, and inadequate number of viral copies(<138 copies/mL). A negative result must be combined with clinical observations, patient history, and epidemiological information. The expected result is Negative.  Fact Sheet for Patients:  BloggerCourse.com  Fact Sheet for Healthcare Providers:  SeriousBroker.it  This test is no t yet approved or cleared by the [place] FDA and  has been authorized for detection and/or diagnosis of SARS-CoV-2 by FDA under an Emergency Use Authorization (EUA). This EUA will remain  in effect (meaning this test can be used) for the duration of the COVID-19 declaration under Section 564(b)(1) of the Act, 21 U.S.C.section 360bbb-3(b)(1), unless the authorization is terminated  or revoked sooner.       Influenza A by PCR NEGATIVE NEGATIVE Final   Influenza B by PCR NEGATIVE NEGATIVE Final    Comment: (NOTE) The Xpert Xpress SARS-CoV-2/FLU/RSV plus assay is intended as an aid in the diagnosis of influenza from Nasopharyngeal swab specimens and should not be used as a sole basis for treatment. Nasal washings and aspirates are unacceptable for Xpert Xpress SARS-CoV-2/FLU/RSV testing.  Fact Sheet for Patients: BloggerCourse.com  Fact Sheet for Healthcare Providers: SeriousBroker.it  This test is not yet approved or cleared by the [place] FDA and has been authorized for detection and/or diagnosis of SARS-CoV-2 by FDA under an Emergency Use Authorization (EUA). This EUA will remain in effect (meaning this test can be used) for the duration of the COVID-19 declaration under Section 564(b)(1) of the Act, 21 U.S.C. section 360bbb-3(b)(1), unless the authorization is terminated  or revoked.  Performed at [facility], [address] [place], [place] [address]   Body fluid culture w Gram Stain     Status: None (Preliminary result)   Collection Time: 10/09/20 11:05 AM   Specimen: PATH Cytology Pleural fluid  Result Value Ref Range Status   Specimen Description   Final    PLEURAL Performed at [facility], [address] [place], [place] [address]    Special Requests   Final    NONE Performed at [facility], [address] [place] [place], [place] [address]  Gram Stain   Final    NO WBC SEEN NO ORGANISMS SEEN Performed at [facility], [address], [place], [place] [address]    Culture PENDING  Incomplete   Report Status PENDING  Incomplete         Radiology Studies: DG Chest Port 1 View  Result Date: 10/09/2020 CLINICAL DATA:  80 year old female status post left-sided thoracentesis. EXAM: PORTABLE CHEST 1 VIEW COMPARISON:  Chest x-ray 10/07/2020. FINDINGS: Previously noted left-sided pleural effusion has substantially decreased in size, either completely resolved or with a trace amount of remaining fluid. There does appear to be a trace right pleural effusion. No definite pneumothorax. No acute consolidative airspace disease. No evidence of pulmonary edema. Heart size is enlarged. Upper mediastinal contours are within normal limits. Aortic atherosclerosis. Status post median sternotomy for CABG. IMPRESSION: 1. Successful drainage of left-sided pleural effusion via thoracentesis with no pneumothorax or other acute complicating features. 2. Trace right pleural effusion. 3. Cardiomegaly. 4. Aortic atherosclerosis. Electronically Signed   By: Trudie Reed M.D.   On: 10/09/2020 11:36   DG Chest Portable 1 View  Result Date: 10/07/2020 CLINICAL DATA:  Short of breath EXAM: PORTABLE CHEST 1 VIEW COMPARISON:  04/26/2020, CT 05/04/2020 FINDINGS: Post sternotomy changes. Small left-sided pleural effusion with basilar  airspace disease. Borderline to mild cardiomegaly. Aortic atherosclerosis. No pneumothorax. IMPRESSION: Small left pleural effusion with basilar airspace disease, atelectasis versus pneumonia. Electronically Signed   By: Jasmine Pang M.D.   On: 10/07/2020 17:45   ECHOCARDIOGRAM COMPLETE  Result Date: 10/08/2020    ECHOCARDIOGRAM REPORT   Patient Name:   JEANE CASHATT Date of Exam: 10/08/2020 Medical Rec #:  [id number]       Height:       63.0 in Accession #:    [id number]      Weight:       126.5 lb Date of Birth:  1941-07-28       BSA:          1.592 m Patient Age:    79 years        BP:           156/94 mmHg Patient Gender: F               HR:           98 bpm. Exam Location:  [facility] Procedure: 2D Echo, Cardiac Doppler and Color Doppler Indications:     Dyspnea R06.00  History:         Patient has prior history of Echocardiogram examinations, most                  recent 04/29/2020. Previous Myocardial Infarction, TIA; Risk                  Factors:Diabetes.  Sonographer:     Cristela Blue RDCS (AE) Referring Phys:  [id number] AMY N COX Diagnosing Phys: Marcina Millard MD IMPRESSIONS  1. Left ventricular ejection fraction, by estimation, is 20 to 25%. The left ventricle has severely decreased function. The left ventricle demonstrates global hypokinesis. The left ventricular internal cavity size was moderately dilated. Left ventricular diastolic parameters were normal.  2. Right ventricular systolic function is normal. The right ventricular size is normal.  3. Large pleural effusion in the left lateral region.  4. The mitral valve is normal in structure. Moderate to severe mitral valve regurgitation. No evidence of mitral stenosis.  5. Tricuspid valve regurgitation is moderate to severe.  6. The aortic valve is normal in structure. Aortic valve regurgitation is not visualized. No aortic stenosis is present.  7. The inferior vena cava is normal in size with greater than 50% respiratory variability, suggesting right  atrial pressure of 3 mmHg. FINDINGS  Left Ventricle: Left ventricular ejection fraction, by estimation, is 20 to 25%. The left ventricle has severely decreased function. The left ventricle demonstrates global hypokinesis. The left ventricular internal cavity size was moderately dilated. There is no left ventricular hypertrophy. Left ventricular diastolic parameters were normal. Right Ventricle: The right ventricular size is normal. No increase in right ventricular wall thickness. Right ventricular systolic function is normal. Left Atrium: Left atrial size was normal in size. Right Atrium: Right atrial size was normal in size. Pericardium: There is no evidence of pericardial effusion. Mitral Valve: The mitral valve is normal in structure. Moderate to severe mitral valve regurgitation. No evidence of mitral valve stenosis. MV peak gradient, 5.3 mmHg. The mean mitral valve gradient is 2.0 mmHg. Tricuspid Valve: The tricuspid valve is normal in structure. Tricuspid valve regurgitation is moderate to severe. No evidence of tricuspid stenosis. Aortic Valve: The aortic valve is normal in structure. Aortic valve regurgitation is not visualized. No aortic stenosis is present. Aortic valve mean gradient measures 2.0 mmHg. Aortic valve peak gradient measures 3.5 mmHg. Aortic valve area, by VTI measures 1.77 cm. Pulmonic Valve: The pulmonic valve was normal in structure. Pulmonic valve regurgitation is not visualized. No evidence of pulmonic stenosis. Aorta: The aortic root is normal in size and structure. Venous: The inferior vena cava is normal in size with greater than 50% respiratory variability, suggesting right atrial pressure of 3 mmHg. IAS/Shunts: No atrial level shunt detected by color flow Doppler. Additional Comments: There is a large pleural effusion in the left lateral region.  LEFT VENTRICLE PLAX 2D LVIDd:         4.38 cm LVIDs:         4.00 cm LV PW:         1.48 cm LV IVS:        0.77 cm LVOT diam:     2.00 cm  LV SV:         22 LV SV Index:   14 LVOT Area:     3.14 cm  LV Volumes (MOD) LV vol d, MOD A2C: 107.0 ml LV vol d, MOD A4C: 72.3 ml LV vol s, MOD A2C: 77.3 ml LV vol s, MOD A4C: 59.0 ml LV SV MOD A2C:     29.7 ml LV SV MOD A4C:     72.3 ml LV SV MOD BP:      22.9 ml RIGHT VENTRICLE RV Basal diam:  5.19 cm RV S prime:     6.96 cm/s TAPSE (M-mode): 3.6 cm LEFT ATRIUM             Index       RIGHT ATRIUM           Index LA diam:        2.90 cm 1.82 cm/m  RA Area:     18.70 cm LA Vol (A2C):   84.8 ml 53.28 ml/m RA Volume:   55.90 ml  35.12 ml/m LA Vol (A4C):   51.5 ml 32.36 ml/m LA Biplane Vol: 68.0 ml 42.73 ml/m  AORTIC VALVE                   PULMONIC VALVE AV Area (Vmax):    1.50 cm  PV Vmax:        0.50 m/s AV Area (Vmean):   1.43 cm    PV Peak grad:   1.0 mmHg AV Area (VTI):     1.77 cm    RVOT Peak grad: 1 mmHg AV Vmax:           94.20 cm/s AV Vmean:          65.267 cm/s AV VTI:            0.122 m AV Peak Grad:      3.5 mmHg AV Mean Grad:      2.0 mmHg LVOT Vmax:         45.00 cm/s LVOT Vmean:        29.800 cm/s LVOT VTI:          0.069 m LVOT/AV VTI ratio: 0.56  AORTA Ao Root diam: 2.50 cm MITRAL VALVE               TRICUSPID VALVE MV Area (PHT): 5.62 cm    TR Peak grad:   46.5 mmHg MV Area VTI:   1.11 cm    TR Vmax:        341.00 cm/s MV Peak grad:  5.3 mmHg MV Mean grad:  2.0 mmHg    SHUNTS MV Vmax:       1.15 m/s    Systemic VTI:  0.07 m MV Vmean:      67.6 cm/s   Systemic Diam: 2.00 cm MV Decel Time: 135 msec MV E velocity: 94.30 cm/s MV A velocity: 58.70 cm/s MV E/A ratio:  1.61 Marcina Millard MD Electronically signed by Marcina Millard MD Signature Date/Time: 10/08/2020/1:18:22 PM    Final    US THORACENTESIS ASP PLEURAL SPACE W/IMG GUIDE  Result Date: 10/09/2020 INDICATION: 80 year old with left pleural effusion. EXAM: ULTRASOUND GUIDED LEFT THORACENTESIS MEDICATIONS: None. COMPLICATIONS: None immediate. PROCEDURE: An ultrasound guided thoracentesis was thoroughly discussed with  the patient and questions answered. The benefits, risks, alternatives and complications were also discussed. The patient understands and wishes to proceed with the procedure. Written consent was obtained. Ultrasound was performed to localize and mark an adequate pocket of fluid in the left chest. The area was then prepped and draped in the normal sterile fashion. 1% Lidocaine was used for local anesthesia. Under ultrasound guidance a 6 Fr Safe-T-Centesis catheter was introduced. Thoracentesis was performed. The catheter was removed and a dressing applied. FINDINGS: A total of approximately 475 of yellow serous fluid was removed. Samples were sent to the laboratory as requested by the clinical team. Small right pleural effusion identified with ultrasound. IMPRESSION: Successful ultrasound guided left thoracentesis yielding 475 mL of pleural fluid. Electronically Signed   By: Richarda Overlie M.D.   On: 10/09/2020 11:16        Scheduled Meds: . ascorbic acid  500 mg Oral Daily  . aspirin EC  81 mg Oral QHS  . atorvastatin  40 mg Oral Daily  . brimonidine  1 drop Both Eyes BID  . brinzolamide  1 drop Both Eyes BID  . clopidogrel  75 mg Oral Daily  . enoxaparin (LOVENOX) injection  40 mg Subcutaneous Q24H  . [START ON 10/10/2020] feeding supplement  237 mL Oral BID BM  . folic acid  1 mg Oral Daily  . furosemide  60 mg Intravenous BID  . insulin aspart  0-5 Units Subcutaneous QHS  . insulin aspart  0-9 Units Subcutaneous TID WC  . lamoTRIgine  100 mg  Oral Daily  . levETIRAcetam  500 mg Oral BID  . [START ON 10/10/2020] multivitamin with minerals  1 tablet Oral Daily  . potassium chloride  20 mEq Oral Daily  . ramipril  5 mg Oral Daily  . ranolazine  500 mg Oral BID  . timolol  1 drop Both Eyes BID  . vitamin B-12  500 mcg Oral BH-q7a  . zinc sulfate  220 mg Oral Daily   Continuous Infusions:   LOS: 0 days    Time spent: 35 minutes    Alberteen Sam, MD [facility] 10/09/2020, 4:57 PM     Please page though AMION or Epic secure chat:  For [employer], Higher education careers adviser

## 2020-10-09 NOTE — Consult Note (Signed)
Heart Failure Nurse Navigator Note  HFrEF 20-25%. Normal right ventricular systolic function. Moderate to severe mitral regurgitation.   She presented to the emergency room with complaints of shortness of breath and lower extremity edema for 1 week.   Comorbidities:  Coronary artery disease/coronary artery bypass grafting/valvular repair Hypertension Hyperlipidemia Diabetes GERD Anemia   Medications:  Aspirin 81 mg daily atorvastatin 40 mg daily Plavix 75 mg daily Furosemide 60 mg IV 2 times a week Potassium chloride 20 mEq Ramipril 5 mg daily Ranexa 500 mg 2 times a day  Labs:  Sodium 134, potassium 3.8, chloride 101, CO2 23, BUN 32, creatinine 1.02 down from 1.13 of yesterday, white blood count 3.5, hemoglobin 9.5, hematocrit 29, magnesium 1.1 Intake 780 mL Output 1900 mL Weight 57.2 kg BMI 22.3 Blood pressure 142/83   Assessment:  General-she is awake and alert lying in bed in no acute distress.  HEENT-wears glasses, sclera icteric.  Cardiac-heart tones of regular rate and rhythm  Chest-breath sounds clear anteriorly.  Abdomen soft nontender.  Musculoskeletal-lower extremities remain edematous and pitting at 1+.  Psych-she is pleasant and appropriate and makes good eye contact  Neurologic-speech is clear, moves all extremities without difficulty.    Initial visit with patient. She states that she lives with her youngest son and his girlfriend. She states that she is no longer able to cook. States that she eats like a bird as foods do not taste good to her. She states that her oncologist has recommended that she drink Gatorade, Pedialyte and Ensure.  She states at home that she sits in the chair most of the day and reads but does not like to lay in bed. While her son is at work she uses the rolling walker to ambulate to the bathroom. She states that she can hardly walk 15 feet without being short of breath and states prior to coming into the hospital she  could not even talk due to shortness of breath. She underwent thoracentesis today of the left side removing 475 mL fluid.  She is unable to weigh herself daily. She states that she gets weighed when she goes to the doctor's office.  She states that she is no longer on metoprolol to Dr. Gwen Pounds discontinuing it.   Spoke with the dietitian if there was some other nutritious drinks that she could take that would not be a high in sodium, also instructed that patient likes to drink milkshakes or malts. She will be seen by the dietitian.   Tresa Endo RN,CHFN

## 2020-10-09 NOTE — Procedures (Signed)
Interventional Radiology Procedure:   Indications: Pleural effusions.  Procedure: US guided left thoracentesis  Findings: Removed 475 ml from left chest.  Small amount of right pleural fluid based on [place].  Complications: None     EBL: Less than 10 ml  Plan: Follow up CXR   Leah Small R. Lowella Dandy, MD  Pager: [phone]

## 2020-10-10 LAB — BASIC METABOLIC PANEL
Anion gap: 9 (ref 5–15)
BUN: 32 mg/dL — ABNORMAL HIGH (ref 8–23)
CO2: 25 mmol/L (ref 22–32)
Calcium: 8 mg/dL — ABNORMAL LOW (ref 8.9–10.3)
Chloride: 98 mmol/L (ref 98–111)
Creatinine, Ser: 1.23 mg/dL — ABNORMAL HIGH (ref 0.44–1.00)
GFR, Estimated: 44 mL/min — ABNORMAL LOW (ref >60)
Glucose, Bld: 143 mg/dL — ABNORMAL HIGH (ref 70–99)
Potassium: 3.8 mmol/L (ref 3.5–5.1)
Sodium: 132 mmol/L — ABNORMAL LOW (ref 135–145)

## 2020-10-10 LAB — GLUCOSE, CAPILLARY
Glucose-Capillary: 140 mg/dL — ABNORMAL HIGH (ref 70–99)
Glucose-Capillary: 249 mg/dL — ABNORMAL HIGH (ref 70–99)
Glucose-Capillary: 261 mg/dL — ABNORMAL HIGH (ref 70–99)
Glucose-Capillary: 196 mg/dL — ABNORMAL HIGH (ref 70–99)

## 2020-10-10 LAB — CBC WITH DIFFERENTIAL/PLATELET
Abs Immature Granulocytes: 0.02 10*3/uL (ref 0.00–0.07)
Basophils Absolute: 0 10*3/uL (ref 0.0–0.1)
Basophils Relative: 1 %
Eosinophils Absolute: 0.2 10*3/uL (ref 0.0–0.5)
Eosinophils Relative: 4 %
HCT: 27.6 % — ABNORMAL LOW (ref 36.0–46.0)
Hemoglobin: 9.6 g/dL — ABNORMAL LOW (ref 12.0–15.0)
Immature Granulocytes: 1 %
Lymphocytes Relative: 23 %
Lymphs Abs: 0.9 10*3/uL (ref 0.7–4.0)
MCH: 37.2 pg — ABNORMAL HIGH (ref 26.0–34.0)
MCHC: 34.8 g/dL (ref 30.0–36.0)
MCV: 107 fL — ABNORMAL HIGH (ref 80.0–100.0)
Monocytes Absolute: 0.3 10*3/uL (ref 0.1–1.0)
Monocytes Relative: 8 %
Neutro Abs: 2.5 10*3/uL (ref 1.7–7.7)
Neutrophils Relative %: 63 %
Platelets: 113 10*3/uL — ABNORMAL LOW (ref 150–400)
RBC: 2.58 MIL/uL — ABNORMAL LOW (ref 3.87–5.11)
RDW: 18.1 % — ABNORMAL HIGH (ref 11.5–15.5)
WBC: 3.9 10*3/uL — ABNORMAL LOW (ref 4.0–10.5)
nRBC: 0 % (ref 0.0–0.2)

## 2020-10-10 LAB — MAGNESIUM: Magnesium: 1.2 mg/dL — ABNORMAL LOW (ref 1.7–2.4)

## 2020-10-10 LAB — LACTATE DEHYDROGENASE: LDH: 128 U/L (ref 98–192)

## 2020-10-10 LAB — CYTOLOGY - NON PAP

## 2020-10-10 LAB — PROTEIN, BODY FLUID (OTHER): Total Protein, Body Fluid Other: 2.1 g/dL

## 2020-10-10 MED ORDER — FUROSEMIDE 40 MG PO TABS
40.0000 mg | ORAL_TABLET | Freq: Every day | ORAL | Status: DC
  Filled 2020-10-10: qty 1

## 2020-10-10 MED ORDER — MAGNESIUM SULFATE 2 GM/50ML IV SOLN
2.0000 g | Freq: Once | INTRAVENOUS | Status: AC
  Administered 2020-10-10: 2 g via INTRAVENOUS
  Filled 2020-10-10: qty 50

## 2020-10-10 MED ORDER — FUROSEMIDE 10 MG/ML IJ SOLN
4.0000 mg/h | INTRAVENOUS | Status: AC
  Administered 2020-10-10 – 2020-10-11 (×2): 4 mg/h via INTRAVENOUS
  Filled 2020-10-10: qty 20

## 2020-10-10 MED ORDER — SENNA 8.6 MG PO TABS
1.0000 | ORAL_TABLET | Freq: Every evening | ORAL | Status: DC | PRN
  Administered 2020-10-10 – 2020-10-11 (×2): 8.6 mg via ORAL
  Filled 2020-10-10 (×2): qty 1

## 2020-10-10 NOTE — Progress Notes (Signed)
MD Gwen Pounds secure chat to this RN to stop lasix gtt and will be reassured tomorrow due to decline in BP. Patient's BP rechecked and MEWS turned green at this time.

## 2020-10-10 NOTE — Progress Notes (Signed)
Ambulated patient in room to bathroom door with walker. Patient had no pain and oxygen remained 98-100%. Patient fatigued and unable to go further. MD made aware of patient's progress. Son Dorinda Hill at bedside and MD Danford notified via secure chat.

## 2020-10-10 NOTE — Evaluation (Signed)
Physical Therapy Evaluation Patient Details Name: Leah Small MRN: [id number] DOB: [birth year] Today's Date: 10/10/2020   History of Present Illness  80 y.o. female with medical history significant for coronary artery disease status post CABG, valvular repair, CVA, hypertension, hyperlipidemia, non-insulin-dependent diabetes mellitus, GERD, seizures, TIA, neuropathy, presents to the emergency department for chief concerns of shortness of breath.     She endorses worsening shortness of breath for one week  Clinical Impression  Pt showed good effort with PT exam but did struggle to maintain consistent cadence/gait despite much cuing (tactile and verbal).  Pt would but together a few steps and then freeze, needing encouragement/assist with walker to begin again.  Son present and reports that this has been typical for her in the last month or so.  Pt did better when she was repeating "1-2-3, 1-2-3" with each step.  Vitals stable t/o, no LOBs.  Son reports that family can give near 24/7 assist initially if needed.     Follow Up Recommendations Home health PT;Supervision - Intermittent    Equipment Recommendations  None recommended by PT    Recommendations for Other Services       Precautions / Restrictions Precautions Precautions: Fall Restrictions Weight Bearing Restrictions: No      Mobility  Bed Mobility Overal bed mobility: Independent             General bed mobility comments: Pt able to get to/from supine/sitting w/o assist or hesitations    Transfers Overall transfer level: Modified independent Equipment used: Rolling walker (2 wheeled)             General transfer comment: Pt needing some minimal cuing to set up and initiate transfer, but did so w/o direct assist  Ambulation/Gait Ambulation/Gait assistance: Min guard Gait Distance (Feet): 90 Feet Assistive device: Rolling walker (2 wheeled)       General Gait Details: Pt was not too keen on the idea of a  longer walk but did agree to do some.  On first bout she had inconsistent/stop-go gait and needed consistent cuing to maintain any momentum, walking ~25 ft before requesting to sit.  After brief rest break she agreed to do another bout and while she was again inconsistent with cadence and execution she was able to maintain consistent cadence with light assist from PT to advance the walker (she later was able to maintain speed w/o PT assist) and put together a much better, more consistent bout of ambulation with increased speed, safety and cadnece (~90 ft).  O2 and HR stable and appropriate t/o the effort.  Stairs            Wheelchair Mobility    Modified Rankin (Stroke Patients Only)       Balance Overall balance assessment: Modified Independent;Needs assistance   Sitting balance-Leahy Scale: Normal     Standing balance support: Bilateral upper extremity supported Standing balance-Leahy Scale: Fair Standing balance comment: Pt with no overt LOBs, but did need consistent UE use on walker and  tactile guidance during standing tasks                             Pertinent Vitals/Pain      Home Living Family/patient expects to be discharged to:: Private residence Living Arrangements: Children Available Help at Discharge: Family;Available PRN/intermittently   Home Access: Ramped entrance     Home Layout: One level Home Equipment: Walker - 2 wheels;Walker - 4 wheels;Cane -  single point;Shower seat      Prior Function Level of Independence: Independent with assistive device(s)         Comments: son reports that a few months ago she was able to walk around in stores and be on feet for extended bouts of ambulation, over the last month she has been more limited to in home distances and feeling/looking weaker     Hand Dominance        Extremity/Trunk Assessment   Upper Extremity Assessment Upper Extremity Assessment: Generalized weakness (age appropraite  limitations, minimally weaker on L 2/2 old CVA`)    Lower Extremity Assessment Lower Extremity Assessment: Generalized weakness (age appropriate limitations)       Communication   Communication: No difficulties  Cognition Arousal/Alertness: Awake/alert Behavior During Therapy: WFL for tasks assessed/performed Overall Cognitive Status: Within Functional Limits for tasks assessed                                 General Comments: son present and reports that she is at/near baseline - pt did have some occasional confusion/tangential discussion and needed repeated cuing at time to stay on task, aware of situation, date, home status, etc      General Comments      Exercises     Assessment/Plan    PT Assessment Patient needs continued PT services  PT Problem List Decreased strength;Decreased range of motion;Decreased balance;Decreased activity tolerance;Decreased mobility;Decreased cognition;Decreased coordination;Decreased knowledge of use of DME;Decreased safety awareness       PT Treatment Interventions DME instruction;Gait training;Stair training;Therapeutic exercise;Functional mobility training;Therapeutic activities;Balance training;Cognitive remediation;Patient/family education;Neuromuscular re-education    PT Goals (Current goals can be found in the Care Plan section)  Acute Rehab PT Goals Patient Stated Goal: go home PT Goal Formulation: With patient Time For Goal Achievement: 10/24/20 Potential to Achieve Goals: Good    Frequency Min 2X/week   Barriers to discharge        Co-evaluation               AM-PAC PT "6 Clicks" Mobility  Outcome Measure Help needed turning from your back to your side while in a flat bed without using bedrails?: None Help needed moving from lying on your back to sitting on the side of a flat bed without using bedrails?: None Help needed moving to and from a bed to a chair (including a wheelchair)?: None Help needed  standing up from a chair using your arms (e.g., wheelchair or bedside chair)?: None Help needed to walk in hospital room?: A Little Help needed climbing 3-5 steps with a railing? : A Lot 6 Click Score: 21    End of Session Equipment Utilized During Treatment: Gait belt Activity Tolerance: Patient tolerated treatment well Patient left: with chair alarm set;with call bell/phone within reach;with family/visitor present Nurse Communication: Mobility status PT Visit Diagnosis: Muscle weakness (generalized) (M62.81);Difficulty in walking, not elsewhere classified (R26.2)    Time: 6283-6629 PT Time Calculation (min) (ACUTE ONLY): 32 min   Charges:   PT Evaluation $PT Eval Low Complexity: 1 Low PT Treatments $Gait Training: 8-22 mins        Malachi Pro, DPT 10/10/2020, 5:01 PM

## 2020-10-10 NOTE — TOC Initial Note (Signed)
Transition of Care [facility]) - Initial/Assessment Note    Patient Details  Name: Leah Small MRN: [id number] Date of Birth: May 04, 1941  Transition of Care [facility]) CM/SW Contact:    Hetty Ely, RN Phone Number: 10/10/2020, 10:53 AM  Clinical Narrative:  Spoke with patient, Son also at bedside. Patient pleasant, alert and oriented x3, independent with ADL's. Son lives with patient and assist as needed. Son at bedside assist with transport to apppointments, shopping and picking up medications at the [facility] pharmacy in [place]. Patient is followed by [facility] for Palliative monthly visits. Patient uses rollator for ambulation. Meals are prepared by use of Microwave, son states patient eats microwave meals.PCP Dr. Judithann Sheen at the [facility]. No identified TOC needs at this time will continue to monitor and assist if needed.                Expected Discharge Plan: Home/Self Care (Palliative [facility]) Barriers to Discharge: Continued Medical Work up   Patient Goals and CMS Choice Patient states their goals for this hospitalization and ongoing recovery are:: To return home.   Choice offered to / list presented to : NA  Expected Discharge Plan and Services Expected Discharge Plan: Home/Self Care (Palliative [facility]) In-house Referral: NA Discharge Planning Services: NA Post Acute Care Choice: Resumption of Svcs/PTA Provider ([facility]iative [facility])                   DME Arranged: N/A DME Agency: NA       HH Arranged: NA HH Agency: NA        Prior Living Arrangements/Services   Lives with:: Adult Children Patient language and need for interpreter reviewed:: Yes Do you feel safe going back to the place where you live?: Yes      Need for Family Participation in Patient Care: Yes (Comment) Care giver support system in place?: Yes (comment) Current home services: Other (comment) ([facility] Palliative) Criminal Activity/Legal Involvement Pertinent to Current  Situation/Hospitalization: No - Comment as needed  Activities of Daily Living Home Assistive Devices/Equipment: Walker (specify type) ADL Screening (condition at time of admission) Patient's cognitive ability adequate to safely complete daily activities?: No Is the patient deaf or have difficulty hearing?: No Does the patient have difficulty seeing, even when wearing glasses/contacts?: No Does the patient have difficulty concentrating, remembering, or making decisions?: No Patient able to express need for assistance with ADLs?: Yes Does the patient have difficulty dressing or bathing?: Yes Independently performs ADLs?: No Communication: Independent Dressing (OT): Needs assistance Is this a change from baseline?: Change from baseline, expected to last <3days Grooming: Needs assistance Is this a change from baseline?: Change from baseline, expected to last <3 days Feeding: Independent Bathing: Needs assistance Is this a change from baseline?: Change from baseline, expected to last <3 days Toileting: Needs assistance Is this a change from baseline?: Change from baseline, expected to last <3 days In/Out Bed: Needs assistance Is this a change from baseline?: Change from baseline, expected to last <3 days Walks in Home: Independent with device (comment) Does the patient have difficulty walking or climbing stairs?: Yes Weakness of Legs: Both Weakness of Arms/Hands: None  Permission Sought/Granted                  Emotional Assessment Appearance:: Appears stated age Attitude/Demeanor/Rapport: Engaged Affect (typically observed): Pleasant,Accepting Orientation: : Oriented to Self,Oriented to Place,Oriented to  Time,Oriented to Situation Alcohol / Substance Use: Not Applicable Psych Involvement: No (comment)  Admission diagnosis:  Shortness of breath [R06.02] Heart failure (HCC) [I50.9] Acute on chronic congestive heart failure, unspecified heart failure type (HCC) [I50.9] Acute  on chronic systolic CHF (congestive heart failure) (HCC) [I50.23] Patient Active Problem List   Diagnosis Date Noted  . Protein-calorie malnutrition, severe 10/09/2020  . Acute on chronic systolic CHF (congestive heart failure) (HCC) 10/09/2020  . Unstageable pressure ulcer of sacral region (HCC) 10/08/2020  . Heart failure (HCC) 10/07/2020  . Chest pain due to CAD (HCC) 04/26/2020  . Anemia 04/26/2020  . Severe thrombocytopenia (HCC) 04/26/2020  . Pancytopenia (HCC)   . Iron deficiency anemia 01/25/2020  . History of ETT   . Acute respiratory failure with hypoxia (HCC)   . Palliative care by specialist   . Goals of care, counseling/discussion   . Pressure injury of skin 12/30/2019  . STEMI (ST elevation myocardial infarction) (HCC) 12/28/2019  . Malnutrition of moderate degree 08/12/2017  . Hypotension   . Carotid stenosis 11/12/2016  . Hypertensive heart disease 01/29/2016  . Normocytic anemia 01/29/2016  . History of vaginal bleeding 01/29/2016  . GERD (gastroesophageal reflux disease) 01/29/2016  . NSTEMI (non-ST elevated myocardial infarction) (HCC) 10/22/2015  . Non-STEMI (non-ST elevated myocardial infarction) (HCC) 10/22/2015  . 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 Coronary atherosclerosis of native coronary artery 10/22/2015  . Angina pectoris (HCC)   . Memory deficit   . Stroke (HCC) 07/06/2015  . Diabetes mellitus with complication (HCC) 07/06/2015  . Essential hypertension 07/06/2015  . Hyperlipidemia 07/06/2015  . Left-sided weakness    PCP:  Marguarite Arbour, MD Pharmacy:   [facility] [place], [place] - [address] [place] [address] Phone: [phone] Fax: [phone]     Social Determinants of Health (SDOH) Interventions    Readmission Risk Interventions Readmission Risk Prevention Plan 10/10/2020  Transportation Screening Complete  PCP or Specialist Appt within 3-5 Days Complete  HRI or Home Care Consult  Complete  Social Work Consult for Recovery Care Planning/Counseling Complete  Palliative Care Screening Complete  Medication Review Oceanographer) Complete  Some recent data might be hidden

## 2020-10-10 NOTE — Progress Notes (Signed)
[facility] PROGRESS NOTE    Leah FishmanDonnie R Small  ZOX:[id number] DOB: Jun 07, 1941 DOA: 10/07/2020 PCP: Marguarite ArbourSparks, Jeffrey D, MD      Brief Narrative:  Leah Small is a 80 y.o. F with CAD s/p CABG, last PCI 2017, hx CVA, HTN, DM and seizures who presented with shortness of breath for 1 week, leg swelling, and DOE.  In the ER, she was started on diuretics for CHF.               Assessment & Plan:  Acute on chronic systolic CHF Moderate to severe tricuspid and mitral regurgitation due to dilated cardiomyopathy Admitted and started on IV Lasix  Net -1.5 L yesterday, 2.6 L on admission.  Creatinine, BUN trending up today.  -Reduce to oral Lasix for now -Consult cardiology for further therapeutic options  -Continue strict I/Os, daily weights, telemetry  -Daily monitoring renal function     Pleural effusion 500 cc removed yesterday, transudive by Lights, as expected in the circumstances   Diabetes with neuropathy Glucose well controlled -Continue sliding scale corrections -Hold home Metformin glipizide -Continue gabapentin    Complex partial seizures No seizure activity -Continue Keppra, Lamictal  Coronary disease, secondary prevention Hypertension Blood pressure soft today -Hold ramipril -Continue ranolazine -Continue aspirin, atorvastatin, Plavix   Sacral pressure injury, stage II, present on admission  Severe protein calorie malnutrition As evidenced by weight loss down to 57 kg over the last year, reported poor oral intake, and severe loss of subcutaneous muscle mass and fat, as well as pressure injury.  Hypomagnesemia -Magnesium supplement  Anemia, normocytic, of chronic disease Most recent iron studies have been replete  Other medications -Hold methotrexate for now            Disposition: Status is: Inpatient  Remains inpatient appropriate because:IV treatments appropriate due to intensity of illness or inability to  take PO   Dispo: The patient is from: Home              Anticipated d/c is to: SNF              Anticipated d/c date is: 2 days              Patient currently is not medically stable to d/c.   Difficult to place patient No    Patient presented with congestive heart failure, she has had response to Lasix, but still is short of breath with exertion and swollen.  In addition, she has had soft blood pressure, worsening renal indices.  Cardiology were consulted and recommend Lasix drip as renal preserving negative further diuresis.  If this fails, patient may to be at her new baseline, and require some degree of hospice support at home.           Level of care: Med-Surg       MDM: The below labs and imaging reports were reviewed and summarized above.  Medication management as above.     DVT prophylaxis: enoxaparin (LOVENOX) injection 40 mg Start: 10/07/20 2100 Place TED hose Start: 10/07/20 2049  Code Status: DNR Family Communication: Son at the bedside   Procedures:   2/15 echocardiogram -- EF still 20-25%, mod-severe MR and TR           Subjective: Breathing feels better, but still swollen.  No weeping.  No fever, confusion.  Out of breath with walking to the door of the room.       Objective: Vitals:   10/10/20 0319 10/10/20  0500 10/10/20 0743 10/10/20 1222  BP: (!) 95/50  108/64 (!) 92/56  Pulse: 72  88 81  Resp: Temp: 98 F (36.7 C)  (!) 97.5 F (36.4 C) (!) 97.4 F (36.3 C)  TempSrc: Oral  Oral Oral  SpO2: 97%  99% 100%  Weight:  55.2 kg    Height:        Intake/Output Summary (Last 24 hours) at 10/10/2020 1439 Last data filed at 10/10/2020 1356 Gross per 24 hour  Intake 945.59 ml  Output 1275 ml  Net -329.41 ml   Filed Weights   10/08/20 0437 10/09/20 0500 10/10/20 0500  Weight: 57.4 kg 57.2 kg 55.2 kg    Examination: General appearance: Frail elderly female, lying in bed, interactive     HEENT:    Skin: Chronic  venous stasis changes of the leg Cardiac: Regular rate and rhythm, actually do not appreciate murmurs today, JVP not visible, 2+ lower extremity edema, radial pulses symmetric and 2+. Respiratory: Normal respiratory rate and rhythm, no crackles on exam, no wheezing. Abdomen: Abdomen soft without tenderness palpation or guarding, no ascites or distention MSK: Diffuse loss of subcutaneous muscle mass and fat. Neuro: Awake and alert, extraocular movements intact, moves all extremities with normal strength and coordination, speech fluent    Psych: Affect normal, judgment insight appear normal      Data Reviewed: I have personally reviewed following labs and imaging studies:  CBC: Recent Labs  Lab 10/07/20 1756 10/08/20 0529 10/09/20 0512 10/10/20 0516  WBC 6.5 4.4 3.5* 3.9*  NEUTROABS  --   --  1.9 2.5  HGB 10.6* 10.7* 9.5* 9.6*  HCT 33.1* 32.6* 29.2* 27.6*  MCV 113.0* 112.0* 110.2* 107.0*  PLT 126* 133* 112* 113*   Basic Metabolic Panel: Recent Labs  Lab 10/07/20 1756 10/08/20 0529 10/09/20 0512 10/10/20 0516  NA 132* 133* 134* 132*  K 6.0* 5.0 3.8 3.8  CL 102 102 101 98  CO2 21* 20* 23 25  GLUCOSE 222* 163* 117* 143*  BUN 35* 35* 32* 32*  CREATININE 1.21* 1.13* 1.02* 1.23*  CALCIUM 8.9 8.7* 8.4* 8.0*  MG  --   --  1.1* 1.2*   GFR: Estimated Creatinine Clearance: 30.2 mL/min (A) (by C-G formula based on SCr of 1.23 mg/dL (H)). Liver Function Tests: Recent Labs  Lab 10/07/20 1756  AST 21  ALT 13  ALKPHOS 63  BILITOT 1.1  PROT 7.8  ALBUMIN 3.7   No results for input(s): LIPASE, AMYLASE in the last 168 hours. No results for input(s): AMMONIA in the last 168 hours. Coagulation Profile: No results for input(s): INR, PROTIME in the last 168 hours. Cardiac Enzymes: No results for input(s): CKTOTAL, CKMB, CKMBINDEX, TROPONINI in the last 168 hours. BNP (last 3 results) No results for input(s): PROBNP in the last 8760 hours. HbA1C: Recent Labs    10/08/20 0008   HGBA1C 5.7*   CBG: Recent Labs  Lab 10/09/20 1201 10/09/20 1604 10/09/20 2053 10/10/20 0744 10/10/20 1222  GLUCAP 249* 177* 181* 140* 249*   Lipid Profile: No results for input(s): CHOL, HDL, LDLCALC, TRIG, CHOLHDL, LDLDIRECT in the last 72 hours. Thyroid Function Tests: Recent Labs    10/08/20 0529  TSH 6.312*   Anemia Panel: No results for input(s): VITAMINB12, FOLATE, FERRITIN, TIBC, IRON, RETICCTPCT in the last 72 hours. Urine analysis:    Component Value Date/Time   COLORURINE COLORLESS (A) 12/28/2019 1818   APPEARANCEUR CLEAR (A) 12/28/2019 1818  LABSPEC 1.014 12/28/2019 1818   PHURINE 6.0 12/28/2019 1818   GLUCOSEU 50 (A) 12/28/2019 1818   HGBUR NEGATIVE 12/28/2019 1818   BILIRUBINUR NEGATIVE 12/28/2019 1818   KETONESUR NEGATIVE 12/28/2019 1818   PROTEINUR NEGATIVE 12/28/2019 1818   UROBILINOGEN 0.2 07/06/2015 1558   NITRITE NEGATIVE 12/28/2019 1818   LEUKOCYTESUR NEGATIVE 12/28/2019 1818   Sepsis Labs: @LABRCNTIP (procalcitonin:4,lacticacidven:4)  ) Recent Results (from the past 240 hour(s))  Resp Panel by RT-PCR (Flu A&B, Covid) Nasopharyngeal Swab     Status: None   Collection Time: 10/07/20  5:58 PM   Specimen: Nasopharyngeal Swab; Nasopharyngeal(NP) swabs in vial transport medium  Result Value Ref Range Status   SARS Coronavirus 2 by RT PCR NEGATIVE NEGATIVE Final    Comment: (NOTE) SARS-CoV-2 target nucleic acids are NOT DETECTED.  The SARS-CoV-2 RNA is generally detectable in upper respiratory specimens during the acute phase of infection. The lowest concentration of SARS-CoV-2 viral copies this assay can detect is 138 copies/mL. A negative result does not preclude SARS-Cov-2 infection and should not be used as the sole basis for treatment or other patient management decisions. A negative result may occur with  improper specimen collection/handling, submission of specimen other than nasopharyngeal swab, presence of viral mutation(s) within  the areas targeted by this assay, and inadequate number of viral copies(<138 copies/mL). A negative result must be combined with clinical observations, patient history, and epidemiological information. The expected result is Negative.  Fact Sheet for Patients:  10/09/20  Fact Sheet for Healthcare Providers:  BloggerCourse.com  This test is no t yet approved or cleared by the [employer] FDA and  has been authorized for detection and/or diagnosis of SARS-CoV-2 by FDA under an Emergency Use Authorization (EUA). This EUA will remain  in effect (meaning this test can be used) for the duration of the COVID-19 declaration under Section 564(b)(1) of the Act, 21 U.S.C.section 360bbb-3(b)(1), unless the authorization is terminated  or revoked sooner.       Influenza A by PCR NEGATIVE NEGATIVE Final   Influenza B by PCR NEGATIVE NEGATIVE Final    Comment: (NOTE) The Xpert Xpress SARS-CoV-2/FLU/RSV plus assay is intended as an aid in the diagnosis of influenza from Nasopharyngeal swab specimens and should not be used as a sole basis for treatment. Nasal washings and aspirates are unacceptable for Xpert Xpress SARS-CoV-2/FLU/RSV testing.  Fact Sheet for Patients: [place]  Fact Sheet for Healthcare Providers: BloggerCourse.com  This test is not yet approved or cleared by the [employer] FDA and has been authorized for detection and/or diagnosis of SARS-CoV-2 by FDA under an Emergency Use Authorization (EUA). This EUA will remain in effect (meaning this test can be used) for the duration of the COVID-19 declaration under Section 564(b)(1) of the Act, 21 U.S.C. section 360bbb-3(b)(1), unless the authorization is terminated or revoked.  Performed at [facility], [address] [place], [place]   Body fluid culture w Gram Stain     Status: None  (Preliminary result)   Collection Time: 10/09/20 11:05 AM   Specimen: PATH Cytology Pleural fluid  Result Value Ref Range Status   Specimen Description   Final    PLEURAL Performed at [facility], [address] [place], [place]    Special Requests   Final    NONE Performed at [facility], [address] [place], [place]    Gram Stain NO WBC SEEN NO ORGANISMS SEEN   Final   Culture  Final    NO GROWTH < 24 HOURS Performed at [facility], [address], [place], [place] [address]    Report Status PENDING  Incomplete         Radiology Studies: DG Chest Port 1 View  Result Date: 10/09/2020 CLINICAL DATA:  80 year old female status post left-sided thoracentesis. EXAM: PORTABLE CHEST 1 VIEW COMPARISON:  Chest x-ray 10/07/2020. FINDINGS: Previously noted left-sided pleural effusion has substantially decreased in size, either completely resolved or with a trace amount of remaining fluid. There does appear to be a trace right pleural effusion. No definite pneumothorax. No acute consolidative airspace disease. No evidence of pulmonary edema. Heart size is enlarged. Upper mediastinal contours are within normal limits. Aortic atherosclerosis. Status post median sternotomy for CABG. IMPRESSION: 1. Successful drainage of left-sided pleural effusion via thoracentesis with no pneumothorax or other acute complicating features. 2. Trace right pleural effusion. 3. Cardiomegaly. 4. Aortic atherosclerosis. Electronically Signed   By: Trudie Reed M.D.   On: 10/09/2020 11:36   US THORACENTESIS ASP PLEURAL SPACE W/IMG GUIDE  Result Date: 10/09/2020 INDICATION: 80 year old with left pleural effusion. EXAM: ULTRASOUND GUIDED LEFT THORACENTESIS MEDICATIONS: None. COMPLICATIONS: None immediate. PROCEDURE: An ultrasound guided thoracentesis was thoroughly discussed with the patient and questions answered. The benefits, risks, alternatives and  complications were also discussed. The patient understands and wishes to proceed with the procedure. Written consent was obtained. Ultrasound was performed to localize and mark an adequate pocket of fluid in the left chest. The area was then prepped and draped in the normal sterile fashion. 1% Lidocaine was used for local anesthesia. Under ultrasound guidance a 6 Fr Safe-T-Centesis catheter was introduced. Thoracentesis was performed. The catheter was removed and a dressing applied. FINDINGS: A total of approximately 475 of yellow serous fluid was removed. Samples were sent to the laboratory as requested by the clinical team. Small right pleural effusion identified with ultrasound. IMPRESSION: Successful ultrasound guided left thoracentesis yielding 475 mL of pleural fluid. Electronically Signed   By: Richarda Overlie M.D.   On: 10/09/2020 11:16        Scheduled Meds: . ascorbic acid  500 mg Oral Daily  . aspirin EC  81 mg Oral QHS  . atorvastatin  40 mg Oral Daily  . brimonidine  1 drop Both Eyes BID  . brinzolamide  1 drop Both Eyes BID  . clopidogrel  75 mg Oral Daily  . enoxaparin (LOVENOX) injection  40 mg Subcutaneous Q24H  . feeding supplement  237 mL Oral BID BM  . folic acid  1 mg Oral Daily  . insulin aspart  0-5 Units Subcutaneous QHS  . insulin aspart  0-9 Units Subcutaneous TID WC  . lamoTRIgine  100 mg Oral Daily  . levETIRAcetam  500 mg Oral BID  . multivitamin with minerals  1 tablet Oral Daily  . potassium chloride  20 mEq Oral Daily  . ramipril  5 mg Oral Daily  . ranolazine  500 mg Oral BID  . timolol  1 drop Both Eyes BID  . vitamin B-12  500 mcg Oral BH-q7a  . zinc sulfate  220 mg Oral Daily   Continuous Infusions: . furosemide (LASIX) 200 mg in dextrose 5% 100 mL (2mg /mL) infusion 4 mg/hr (10/10/20 1435)     LOS: 1 day    Time spent: 25 minutes    10/12/20, MD [facility] 10/10/2020, 2:39 PM     Please page though AMION or Epic secure  chat:  For [employer], Higher education careers adviser

## 2020-10-10 NOTE — Plan of Care (Signed)

## 2020-10-10 NOTE — Consult Note (Signed)
[facility] Cardiology Consultation Note  Patient ID: Leah Small, MRN: [id number], DOB/AGE: 1940/12/19 80 y.o. Admit date: 10/07/2020   Date of Consult: 10/10/2020 Primary Physician: Marguarite Arbour, MD Primary Cardiologist: Gwen Pounds  Chief Complaint:  Chief Complaint  Patient presents with  . Shortness of Breath   Reason for Consult: Heart failure  HPI: 80 y.o. female with known coronary artery disease status post coronary bypass graft and failed grafts with diffuse three-vessel coronary atherosclerosis not amenable to PCI and stent placement with multiple evidence of previous myocardial infarction.  The patient does have severe dilated cardiomyopathy due to this issue with hypertension hyperlipidemia on appropriate medication management.  She has recently had significant progression of lower extremity edema abdominal edema and shortness of breath.  When seen in the emergency room the patient had an EKG showing normal sinus rhythm left atrial enlargement left interventricular conduction defect.  Subsequent echocardiogram has shown severe LV systolic dysfunction with ejection fraction of 25% moderate biatrial enlargement and moderate severe mitral and tricuspid regurgitation.  These are dysfunctional valvular heart issues and not structural.  Therefore they would more precisely been managed with medications.  She was given intravenous Lasix for chest x-ray showing pleural effusion and pulmonary edema for which she has had several liters of fluid out.  She has had some recent increase in her creatinine and worsening glomerular filtration rate due to this issue.  We have discussed at length with the family of further medical management due to the fact that she still has some pulmonary edema.  This includes the possibility of Lasix drip.  Other medication management has been appropriate at this time  Past Medical History:  Diagnosis Date  . Acid reflux   . Arthritis    "all over"  . CHF  (congestive heart failure) (HCC)   . Chronic stable angina (HCC)   . Coronary artery disease   . Dyspnea   . Hypercholesterolemia   . Hypertension   . Myocardial infarction (HCC) 1991; 07/06/2015  . NSTEMI (non-ST elevated myocardial infarction) (HCC) 10/22/2015  . Psoriatic arthritis (HCC)   . 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 TIA (transient ischemic attack)    "several over the last 20 years" (10/22/2015)  . Type II diabetes mellitus (HCC)       Surgical History:  Past Surgical History:  Procedure Laterality Date  . ABDOMINAL HYSTERECTOMY  1973  . APPENDECTOMY  1950s   elementary school  . CARDIAC CATHETERIZATION  X 2-3  . CARDIAC CATHETERIZATION N/A 11/21/2015   Procedure: Left Heart Cath and Coronary Angiography;  Surgeon: Corky Crafts, MD;  Location: [facility] INVASIVE CV LAB;  Service: Cardiovascular;  Laterality: N/A;  . CARDIAC CATHETERIZATION  11/21/2015   Procedure: Coronary Stent Intervention;  Surgeon: Corky Crafts, MD;  Location: [facility] INVASIVE CV LAB;  Service: Cardiovascular;;  . CARDIAC CATHETERIZATION N/A 01/29/2016   Procedure: Left Heart Cath and Cors/Grafts Angiography;  Surgeon: Tonny Bollman, MD;  Location: [facility] INVASIVE CV LAB;  Service: Cardiovascular;  Laterality: N/A;  . CARDIAC CATHETERIZATION N/A 07/22/2016   Procedure: Left Heart Cath and Coronary Angiography;  Surgeon: Iran Ouch, MD;  Location: [facility] INVASIVE CV LAB;  Service: Cardiovascular;  Laterality: N/A;  . CAROTID STENT INSERTION Bilateral 2011-2012   right-left  . CATARACT EXTRACTION W/ INTRAOCULAR LENS  IMPLANT, BILATERAL Bilateral 2009-2010  . CHOLECYSTECTOMY OPEN  ~ 2014  . CORONARY ANGIOPLASTY    .  CORONARY ANGIOPLASTY WITH STENT PLACEMENT  11/21/2015  . CORONARY ARTERY BYPASS GRAFT  08/1995  . CORONARY/GRAFT ACUTE MI REVASCULARIZATION N/A 12/28/2019   Procedure: Coronary/Graft Acute MI Revascularization;  Surgeon:  Alwyn Pea, MD;  Location: [facility] INVASIVE CV LAB;  Service: Cardiovascular;  Laterality: N/A;  . DILATION AND CURETTAGE OF UTERUS  X 1  . LEFT HEART CATH AND CORONARY ANGIOGRAPHY N/A 12/28/2019   Procedure: LEFT HEART CATH AND CORONARY ANGIOGRAPHY;  Surgeon: Alwyn Pea, MD;  Location: [facility] INVASIVE CV LAB;  Service: Cardiovascular;  Laterality: N/A;  . 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 TONSILLECTOMY  ~ 1947   2nd grade     Home Meds: Prior to Admission medications   Medication Sig Start Date End Date Taking? Authorizing Provider  ascorbic acid (VITAMIN C) 500 MG tablet Take 1 tablet (500 mg total) by mouth daily. 01/04/20  Yes Delfino Lovett, MD  aspirin EC 81 MG tablet Take 81 mg by mouth at bedtime.   Yes [provider]  atorvastatin (LIPITOR) 40 MG tablet Take 40 mg by mouth daily. 12/04/19  Yes [provider]  clopidogrel (PLAVIX) 75 MG tablet Take 1 tablet (75 mg total) by mouth every morning. Patient taking differently: Take 75 mg by mouth daily. 08/14/17  Yes Enid Baas, MD  folic acid (FOLVITE) 1 MG tablet Take 1 mg by mouth daily. 03/28/20  Yes [provider]  gabapentin (NEURONTIN) 100 MG capsule Take 100-200 mg by mouth daily as needed.   Yes [provider]  glipiZIDE (GLUCOTROL XL) 5 MG 24 hr tablet Take 5 mg by mouth daily with breakfast.   Yes [provider]  lamoTRIgine (LAMICTAL) 100 MG tablet Take 100 mg by mouth daily. 11/13/19  Yes [provider]  levETIRAcetam (KEPPRA) 500 MG tablet Take 500 mg by mouth 2 (two) times daily. 02/29/20  Yes [provider]  metFORMIN (GLUCOPHAGE) 1000 MG tablet Take 1 tablet (1,000 mg total) by mouth 2 (two) times daily. 01/30/16  Yes Creig Hines, NP  methotrexate 2.5 MG tablet  03/28/20  Yes [provider]  nitroGLYCERIN (NITROSTAT) 0.4 MG SL tablet Place 0.4 mg under the tongue every  5 (five) minutes as needed for chest pain.   Yes [provider]  ramipril (ALTACE) 5 MG capsule Take 5 mg by mouth daily. 08/14/20  Yes [provider]  ranolazine (RANEXA) 500 MG 12 hr tablet Take 1 tablet (500 mg total) by mouth 2 (two) times daily. 08/14/17 04/26/20 Yes Enid Baas, MD  SIMBRINZA 1-0.2 % SUSP Place 1 drop into both eyes 2 (two) times daily.  11/29/19  Yes [provider]  timolol (TIMOPTIC) 0.5 % ophthalmic solution Place 1 drop into both eyes 2 (two) times daily. 06/14/17  Yes [provider]  vitamin B-12 (CYANOCOBALAMIN) 1000 MCG tablet Take 500 mcg by mouth every morning.    Yes [provider]  vitamin E 400 UNIT capsule Take 400 Units by mouth every morning.    Yes [provider]  zinc sulfate 220 (50 Zn) MG capsule Take 1 capsule (220 mg total) by mouth daily. 01/04/20  Yes Delfino Lovett, MD  nystatin cream (MYCOSTATIN) Apply 1 application topically 2 (two) times daily as needed (yeast infection).  04/25/15   [provider]    Inpatient Medications:  . ascorbic acid  500 mg Oral Daily  . aspirin EC  81 mg Oral QHS  .  atorvastatin  40 mg Oral Daily  . brimonidine  1 drop Both Eyes BID  . brinzolamide  1 drop Both Eyes BID  . clopidogrel  75 mg Oral Daily  . enoxaparin (LOVENOX) injection  40 mg Subcutaneous Q24H  . feeding supplement  237 mL Oral BID BM  . folic acid  1 mg Oral Daily  . furosemide  40 mg Oral Daily  . insulin aspart  0-5 Units Subcutaneous QHS  . insulin aspart  0-9 Units Subcutaneous TID WC  . lamoTRIgine  100 mg Oral Daily  . levETIRAcetam  500 mg Oral BID  . multivitamin with minerals  1 tablet Oral Daily  . potassium chloride  20 mEq Oral Daily  . ramipril  5 mg Oral Daily  . ranolazine  500 mg Oral BID  . timolol  1 drop Both Eyes BID  . vitamin B-12  500 mcg Oral BH-q7a  . zinc sulfate  220 mg Oral Daily   . furosemide (LASIX) 200 mg in dextrose 5% 100 mL ( /mL)  infusion      Allergies:  Allergies  Allergen Reactions  . Adhesive [Tape] Other (See Comments)    Bruising and TEARING!!!  Please use "paper" tape  . Talwin [Pentazocine] Anaphylaxis and Swelling    Throat swelling  . Valium [Diazepam] Other (See Comments)    Makes her feel like she's flying  . Vicodin [Hydrocodone-Acetaminophen] Nausea And Vomiting  . Beta Adrenergic Blockers Other (See Comments)    Pt states she not allergic  . Levofloxacin Swelling and Other (See Comments)    Tongue swells   . Simvastatin Swelling and Rash    Mouth swelling    Social History   Socioeconomic History  . Marital status: Widowed    Spouse name: Not on file  . Number of children: 2  . Years of education: Not on file  . Highest education level: Not on file  Occupational History  . Not on file  Tobacco Use  . Smoking status: Never Smoker  . Smokeless tobacco: Never Used  Substance and Sexual Activity  . Alcohol use: No  . Drug use: No  . Sexual activity: Not Currently  Other Topics Concern  . Not on file  Social History Narrative   Lives at home with one of her sons. Independent at baseline.   Social Determinants of Health   Financial Resource Strain: Not on file  Food Insecurity: Not on file  Transportation Needs: Not on file  Physical Activity: Not on file  Stress: Not on file  Social Connections: Not on file  Intimate Partner Violence: Not on file     Family History  Problem Relation Age of Onset  . Heart attack Father   . Diabetes Mother   . Cancer Mother   . Stroke Mother   . Breast cancer Cousin   . Breast cancer Cousin   . Breast cancer Cousin      Review of Systems Positive for shortness of breath edema Negative for: General:  chills, fever, night sweats or weight changes.  Cardiovascular: PND orthopnea syncope dizziness  Dermatological skin lesions rashes Respiratory: Cough congestion Urologic: Frequent urination urination at night and  hematuria Abdominal: negative for nausea, vomiting, diarrhea, bright red blood per rectum, melena, or hematemesis Neurologic: negative for visual changes, and/or hearing changes  All other systems reviewed and are otherwise negative except as noted above.  Labs: No results for input(s): CKTOTAL, CKMB, TROPONINI in the last 72 hours. Lab Results  Component Value Date   WBC 3.9 (L) 10/10/2020   HGB 9.6 (L) 10/10/2020   HCT 27.6 (L) 10/10/2020   MCV 107.0 (H) 10/10/2020   PLT 113 (L) 10/10/2020    Recent Labs  Lab 10/07/20 1756 10/08/20 0529 10/10/20 0516  NA 132*   < > 132*  K 6.0*   < > 3.8  CL 102   < > 98  CO2 21*   < > 25  BUN 35*   < > 32*  CREATININE 1.21*   < > 1.23*  CALCIUM 8.9   < > 8.0*  PROT 7.8  --   --   BILITOT 1.1  --   --   ALKPHOS 63  --   --   ALT 13  --   --   AST 21  --   --   GLUCOSE 222*   < > 143*   < > = values in this interval not displayed.   Lab Results  Component Value Date   CHOL 137 12/28/2019   HDL 44 12/28/2019   LDLCALC 68 12/28/2019   TRIG 137 12/31/2019   No results found for: DDIMER  Radiology/Studies:  DG Chest Port 1 View  Result Date: 10/09/2020 CLINICAL DATA:  80 year old female status post left-sided thoracentesis. EXAM: PORTABLE CHEST 1 VIEW COMPARISON:  Chest x-ray 10/07/2020. FINDINGS: Previously noted left-sided pleural effusion has substantially decreased in size, either completely resolved or with a trace amount of remaining fluid. There does appear to be a trace right pleural effusion. No definite pneumothorax. No acute consolidative airspace disease. No evidence of pulmonary edema. Heart size is enlarged. Upper mediastinal contours are within normal limits. Aortic atherosclerosis. Status post median sternotomy for CABG. IMPRESSION: 1. Successful drainage of left-sided pleural effusion via thoracentesis with no pneumothorax or other acute complicating features. 2. Trace right pleural effusion. 3. Cardiomegaly. 4. Aortic  atherosclerosis. Electronically Signed   By: Trudie Reed M.D.   On: 10/09/2020 11:36   DG Chest Portable 1 View  Result Date: 10/07/2020 CLINICAL DATA:  Short of breath EXAM: PORTABLE CHEST 1 VIEW COMPARISON:  04/26/2020, CT 05/04/2020 FINDINGS: Post sternotomy changes. Small left-sided pleural effusion with basilar airspace disease. Borderline to mild cardiomegaly. Aortic atherosclerosis. No pneumothorax. IMPRESSION: Small left pleural effusion with basilar airspace disease, atelectasis versus pneumonia. Electronically Signed   By: Jasmine Pang M.D.   On: 10/07/2020 17:45   ECHOCARDIOGRAM COMPLETE  Result Date: 10/08/2020    ECHOCARDIOGRAM REPORT   Patient Name:   Leah Small Date of Exam: 10/08/2020 Medical Rec #:  [id number]       Height:       63.0 in Accession #:    [id number]      Weight:       126.5 lb Date of Birth:  Mar 13, 1941       BSA:          1.592 m Patient Age:    79 years        BP:           156/94 mmHg Patient Gender: F               HR:           98 bpm. Exam Location:  [facility] Procedure: 2D Echo, Cardiac Doppler and Color Doppler Indications:     Dyspnea R06.00  History:         Patient has prior history of Echocardiogram examinations, most  recent 04/29/2020. Previous Myocardial Infarction, TIA; Risk                  Factors:Diabetes.  Sonographer:     Cristela Blue RDCS (AE) Referring Phys:  [id number] AMY N COX Diagnosing Phys: Marcina Millard MD IMPRESSIONS  1. Left ventricular ejection fraction, by estimation, is 20 to 25%. The left ventricle has severely decreased function. The left ventricle demonstrates global hypokinesis. The left ventricular internal cavity size was moderately dilated. Left ventricular diastolic parameters were normal.  2. Right ventricular systolic function is normal. The right ventricular size is normal.  3. Large pleural effusion in the left lateral region.  4. The mitral valve is normal in structure. Moderate to severe mitral valve  regurgitation. No evidence of mitral stenosis.  5. Tricuspid valve regurgitation is moderate to severe.  6. The aortic valve is normal in structure. Aortic valve regurgitation is not visualized. No aortic stenosis is present.  7. The inferior vena cava is normal in size with greater than 50% respiratory variability, suggesting right atrial pressure of 3 mmHg. FINDINGS  Left Ventricle: Left ventricular ejection fraction, by estimation, is 20 to 25%. The left ventricle has severely decreased function. The left ventricle demonstrates global hypokinesis. The left ventricular internal cavity size was moderately dilated. There is no left ventricular hypertrophy. Left ventricular diastolic parameters were normal. Right Ventricle: The right ventricular size is normal. No increase in right ventricular wall thickness. Right ventricular systolic function is normal. Left Atrium: Left atrial size was normal in size. Right Atrium: Right atrial size was normal in size. Pericardium: There is no evidence of pericardial effusion. Mitral Valve: The mitral valve is normal in structure. Moderate to severe mitral valve regurgitation. No evidence of mitral valve stenosis. MV peak gradient, 5.3 mmHg. The mean mitral valve gradient is 2.0 mmHg. Tricuspid Valve: The tricuspid valve is normal in structure. Tricuspid valve regurgitation is moderate to severe. No evidence of tricuspid stenosis. Aortic Valve: The aortic valve is normal in structure. Aortic valve regurgitation is not visualized. No aortic stenosis is present. Aortic valve mean gradient measures 2.0 mmHg. Aortic valve peak gradient measures 3.5 mmHg. Aortic valve area, by VTI measures 1.77 cm. Pulmonic Valve: The pulmonic valve was normal in structure. Pulmonic valve regurgitation is not visualized. No evidence of pulmonic stenosis. Aorta: The aortic root is normal in size and structure. Venous: The inferior vena cava is normal in size with greater than 50% respiratory  variability, suggesting right atrial pressure of 3 mmHg. IAS/Shunts: No atrial level shunt detected by color flow Doppler. Additional Comments: There is a large pleural effusion in the left lateral region.  LEFT VENTRICLE PLAX 2D LVIDd:         4.38 cm LVIDs:         4.00 cm LV PW:         1.48 cm LV IVS:        0.77 cm LVOT diam:     2.00 cm LV SV:         22 LV SV Index:   14 LVOT Area:     3.14 cm  LV Volumes (MOD) LV vol d, MOD A2C: 107.0 ml LV vol d, MOD A4C: 72.3 ml LV vol s, MOD A2C: 77.3 ml LV vol s, MOD A4C: 59.0 ml LV SV MOD A2C:     29.7 ml LV SV MOD A4C:     72.3 ml LV SV MOD BP:      22.9 ml RIGHT  VENTRICLE RV Basal diam:  5.19 cm RV S prime:     6.96 cm/s TAPSE (M-mode): 3.6 cm LEFT ATRIUM             Index       RIGHT ATRIUM           Index LA diam:        2.90 cm 1.82 cm/m  RA Area:     18.70 cm LA Vol (A2C):   84.8 ml 53.28 ml/m RA Volume:   55.90 ml  35.12 ml/m LA Vol (A4C):   51.5 ml 32.36 ml/m LA Biplane Vol: 68.0 ml 42.73 ml/m  AORTIC VALVE                   PULMONIC VALVE AV Area (Vmax):    1.50 cm    PV Vmax:        0.50 m/s AV Area (Vmean):   1.43 cm    PV Peak grad:   1.0 mmHg AV Area (VTI):     1.77 cm    RVOT Peak grad: 1 mmHg AV Vmax:           94.20 cm/s AV Vmean:          65.267 cm/s AV VTI:            0.122 m AV Peak Grad:      3.5 mmHg AV Mean Grad:      2.0 mmHg LVOT Vmax:         45.00 cm/s LVOT Vmean:        29.800 cm/s LVOT VTI:          0.069 m LVOT/AV VTI ratio: 0.56  AORTA Ao Root diam: 2.50 cm MITRAL VALVE               TRICUSPID VALVE MV Area (PHT): 5.62 cm    TR Peak grad:   46.5 mmHg MV Area VTI:   1.11 cm    TR Vmax:        341.00 cm/s MV Peak grad:  5.3 mmHg MV Mean grad:  2.0 mmHg    SHUNTS MV Vmax:       1.15 m/s    Systemic VTI:  0.07 m MV Vmean:      67.6 cm/s   Systemic Diam: 2.00 cm MV Decel Time: 135 msec MV E velocity: 94.30 cm/s MV A velocity: 58.70 cm/s MV E/A ratio:  1.61 Marcina Millard MD Electronically signed by Marcina Millard MD  Signature Date/Time: 10/08/2020/1:18:22 PM    Final    US THORACENTESIS ASP PLEURAL SPACE W/IMG GUIDE  Result Date: 10/09/2020 INDICATION: 80 year old with left pleural effusion. EXAM: ULTRASOUND GUIDED LEFT THORACENTESIS MEDICATIONS: None. COMPLICATIONS: None immediate. PROCEDURE: An ultrasound guided thoracentesis was thoroughly discussed with the patient and questions answered. The benefits, risks, alternatives and complications were also discussed. The patient understands and wishes to proceed with the procedure. Written consent was obtained. Ultrasound was performed to localize and mark an adequate pocket of fluid in the left chest. The area was then prepped and draped in the normal sterile fashion. 1% Lidocaine was used for local anesthesia. Under ultrasound guidance a 6 Fr Safe-T-Centesis catheter was introduced. Thoracentesis was performed. The catheter was removed and a dressing applied. FINDINGS: A total of approximately 475 of yellow serous fluid was removed. Samples were sent to the laboratory as requested by the clinical team. Small right pleural effusion identified with ultrasound. IMPRESSION: Successful ultrasound guided left thoracentesis yielding  475 mL of pleural fluid. Electronically Signed   By: Richarda Overlie M.D.   On: 10/09/2020 11:16    EKG: Normal sinus rhythm left atrial enlargement left interventricular conduction defect  Weights: Filed Weights   10/08/20 0437 10/09/20 0500 10/10/20 0500  Weight: 57.4 kg 57.2 kg 55.2 kg     Physical Exam: Blood pressure (!) 92/56, pulse 81, temperature (!) 97.4 F (36.3 C), temperature source Oral, resp. rate 16, height 5\' 3"  (1.6 m), weight 55.2 kg, SpO2 100 %. Body mass index is 21.56 kg/m. General: Well developed, well nourished, in no acute distress. Head eyes ears nose throat: Normocephalic, atraumatic, sclera non-icteric, no xanthomas, nares are without discharge. No apparent thyromegaly and/or mass  Lungs: Normal respiratory effort.   Few wheezes, bibasilar rales, some rhonchi.  Heart: RRR with normal S1 S2. no murmur gallop, no rub, PMI is normal size and placement, carotid upstroke normal without bruit, jugular venous pressure is normal Abdomen: Soft, non-tender, non-distended with normoactive bowel sounds. No hepatomegaly. No rebound/guarding. No obvious abdominal masses. Abdominal aorta is normal size without bruit Extremities: Trace to 1+ edema. no cyanosis, no clubbing, no ulcers  Peripheral : 2+ bilateral upper extremity pulses, 2+ bilateral femoral pulses, 2+ bilateral dorsal pedal pulse Neuro: Alert and oriented. No facial asymmetry. No focal deficit. Moves all extremities spontaneously. Musculoskeletal: Normal muscle tone without kyphosis Psych:  Responds to questions appropriately with a normal affect.    Assessment: 80 year old female with a severe dilated cardiomyopathy due to multiple coronary artery disease and myocardial infarction with acute onset acute on chronic systolic dysfunction heart failure without evidence of myocardial infarction and/or acute coronary syndrome and no significant change in valvular heart disease most amenable to medication management  Plan: 1.  Change intravenous Lasix to Lasix drip for continued treatment of pleural effusions and pulmonary edema without worsening glomerular filtration rate 2.  Continuation of high intensity cholesterol therapy and antianginal medication management as the patient is able to tolerate including Ranexa isosorbide ramipril. 3.  No further cardiac diagnostics necessary at this time due to known cardiovascular disease and no apparent need for surgical and/or anatomic intervention for which unlikely to improve the situation 4.  Further treatment options after above including the reassessment of pleural effusion  Signed, 96 M.D. [facility] 10/10/2020, 1:40 PM

## 2020-10-10 NOTE — Progress Notes (Signed)
   10/10/20 1535  Assess: MEWS Score  Temp 97.9 F (36.6 C)  BP (!) 76/42  Pulse Rate 66  Resp 16  SpO2 100 %  O2 Device Room Air  Assess: MEWS Score  MEWS Temp 0  MEWS Systolic 2  MEWS Pulse 0  MEWS RR 0  MEWS LOC 0  MEWS Score 2  MEWS Score Color Yellow  Assess: if the MEWS score is Yellow or Red  Were vital signs taken at a resting state? Yes  Focused Assessment Change from prior assessment (see assessment flowsheet)  Early Detection of Sepsis Score *See Row Information* Low  MEWS guidelines implemented *See Row Information* Yes  Treat  MEWS Interventions Other (Comment) (Contacted MDs for recommendations and stopped lasix gtt at this time)  Pain Scale 0-10  Pain Score 0  Take Vital Signs  Increase Vital Sign Frequency  Yellow: Q 2hr X 2 then Q 4hr X 2, if remains yellow, continue Q 4hrs  Escalate  MEWS: Escalate Yellow: discuss with charge nurse/RN and consider discussing with provider and RRT  Notify: Charge Nurse/RN  Name of Charge Nurse/RN Notified Shade Flood  Date Charge Nurse/RN Notified 10/10/20  Time Charge Nurse/RN Notified 1542  Notify: Provider  Provider Name/Title Candelaria Celeste  Date Provider Notified 10/10/20  Time Provider Notified 1542  Notification Type Page (Secure chat)  Notification Reason Other (Comment) (MEWS change r/t BP)  Provider response Other (Comment) (Stop lasix gtt)  Date of Provider Response 10/10/20  Time of Provider Response 1543  Document  Patient Outcome Other (Comment) (Continue to monitor vitals)  Progress note created (see row info) Yes

## 2020-10-11 LAB — BASIC METABOLIC PANEL
Anion gap: 10 (ref 5–15)
BUN: 33 mg/dL — ABNORMAL HIGH (ref 8–23)
CO2: 26 mmol/L (ref 22–32)
Calcium: 8.3 mg/dL — ABNORMAL LOW (ref 8.9–10.3)
Chloride: 95 mmol/L — ABNORMAL LOW (ref 98–111)
Creatinine, Ser: 1.19 mg/dL — ABNORMAL HIGH (ref 0.44–1.00)
GFR, Estimated: 46 mL/min — ABNORMAL LOW (ref >60)
Glucose, Bld: 146 mg/dL — ABNORMAL HIGH (ref 70–99)
Potassium: 4.3 mmol/L (ref 3.5–5.1)
Sodium: 131 mmol/L — ABNORMAL LOW (ref 135–145)

## 2020-10-11 LAB — GLUCOSE, CAPILLARY
Glucose-Capillary: 154 mg/dL — ABNORMAL HIGH (ref 70–99)
Glucose-Capillary: 209 mg/dL — ABNORMAL HIGH (ref 70–99)
Glucose-Capillary: 281 mg/dL — ABNORMAL HIGH (ref 70–99)
Glucose-Capillary: 235 mg/dL — ABNORMAL HIGH (ref 70–99)

## 2020-10-11 LAB — CBC WITH DIFFERENTIAL/PLATELET
Abs Immature Granulocytes: 0.01 10*3/uL (ref 0.00–0.07)
Basophils Absolute: 0 10*3/uL (ref 0.0–0.1)
Basophils Relative: 1 %
Eosinophils Absolute: 0.2 10*3/uL (ref 0.0–0.5)
Eosinophils Relative: 4 %
HCT: 29.7 % — ABNORMAL LOW (ref 36.0–46.0)
Hemoglobin: 9.8 g/dL — ABNORMAL LOW (ref 12.0–15.0)
Immature Granulocytes: 0 %
Lymphocytes Relative: 21 %
Lymphs Abs: 0.8 10*3/uL (ref 0.7–4.0)
MCH: 35.5 pg — ABNORMAL HIGH (ref 26.0–34.0)
MCHC: 33 g/dL (ref 30.0–36.0)
MCV: 107.6 fL — ABNORMAL HIGH (ref 80.0–100.0)
Monocytes Absolute: 0.4 10*3/uL (ref 0.1–1.0)
Monocytes Relative: 10 %
Neutro Abs: 2.6 10*3/uL (ref 1.7–7.7)
Neutrophils Relative %: 64 %
Platelets: 114 10*3/uL — ABNORMAL LOW (ref 150–400)
RBC: 2.76 MIL/uL — ABNORMAL LOW (ref 3.87–5.11)
RDW: 17.9 % — ABNORMAL HIGH (ref 11.5–15.5)
WBC: 4 10*3/uL (ref 4.0–10.5)
nRBC: 0 % (ref 0.0–0.2)

## 2020-10-11 LAB — MAGNESIUM: Magnesium: 1.7 mg/dL (ref 1.7–2.4)

## 2020-10-11 MED ORDER — ACETAMINOPHEN 325 MG PO TABS
650.0000 mg | ORAL_TABLET | Freq: Four times a day (QID) | ORAL | Status: DC | PRN
  Administered 2020-10-11 – 2020-10-12 (×2): 650 mg via ORAL
  Filled 2020-10-11 (×2): qty 2

## 2020-10-11 NOTE — Progress Notes (Signed)
Notified by MD Danford to restart lasix gtt and assess BP frequently to ensure it is stable. Will update MD of vital signs after restarting lasix gtt at 51mL/hr.

## 2020-10-11 NOTE — Progress Notes (Signed)
[facility] Cardiology [facility] Encounter Note  Patient: Leah Small / Admit Date: 10/07/2020 / Date of Encounter: 10/11/2020, 8:38 AM   Subjective: 2/18.  Patient had some amount of hypotension yesterday which is somewhat common for her although there was concerns about taking intravenous Lasix.  Therefore the Lasix was held last night until today.  She is sitting up eating her breakfast doing fine with a reasonable blood pressure today.  She does not have any blood pressure medications at this time but does take Ranexa which may cause some lower blood pressure at times.  She does continue to have bibasilar Rales for which she will need further treatment before discharge to home  Review of Systems: Positive for: Shortness of breath Negative for: Vision change, hearing change, syncope, dizziness, nausea, vomiting,diarrhea, bloody stool, stomach pain, cough, congestion, diaphoresis, urinary frequency, urinary pain,skin lesions, skin rashes Others previously listed  Objective: Telemetry: Normal sinus rhythm Physical Exam: Blood pressure 137/78, pulse 86, temperature (!) 97.5 F (36.4 C), temperature source Oral, resp. rate 16, height  (1.6 m), weight 55.8 kg, SpO2 97 %. Body mass index is 21.79 kg/m. General: Well developed, well nourished, in no acute distress. Head: Normocephalic, atraumatic, sclera non-icteric, no xanthomas, nares are without discharge. Neck: No apparent masses Lungs: Normal respirations with few wheezes, no rhonchi, basilar rales , no crackles   Heart: Regular rate and rhythm, normal S1 S2, no murmur, no rub, no gallop, PMI is normal size and placement, carotid upstroke normal without bruit, jugular venous pressure normal Abdomen: Soft, non-tender, non-distended with normoactive bowel sounds. No hepatosplenomegaly. Abdominal aorta is normal size without bruit Extremities: 1+ edema, no clubbing, no cyanosis, no ulcers,  Peripheral: 2+ radial, 2+ femoral, 2+  dorsal pedal pulses Neuro: Alert and oriented. Moves all extremities spontaneously. Psych:  Responds to questions appropriately with a normal affect.   Intake/Output Summary (Last 24 hours) at 10/11/2020 0838 Last data filed at 10/11/2020 0809 Gross per 24 hour  Intake 885.81 ml  Output 500 ml  Net 385.81 ml    Inpatient Medications:  . ascorbic acid  500 mg Oral Daily  . aspirin EC  81 mg Oral QHS  . atorvastatin  40 mg Oral Daily  . brimonidine  1 drop Both Eyes BID  . brinzolamide  1 drop Both Eyes BID  . clopidogrel  75 mg Oral Daily  . enoxaparin (LOVENOX) injection  40 mg Subcutaneous Q24H  . feeding supplement  237 mL Oral BID BM  . folic acid  1 mg Oral Daily  . insulin aspart  0-5 Units Subcutaneous QHS  . insulin aspart  0-9 Units Subcutaneous TID WC  . lamoTRIgine  100 mg Oral Daily  . levETIRAcetam  500 mg Oral BID  . multivitamin with minerals  1 tablet Oral Daily  . potassium chloride  20 mEq Oral Daily  . ranolazine  500 mg Oral BID  . timolol  1 drop Both Eyes BID  . vitamin B-12  500 mcg Oral BH-q7a  . zinc sulfate  220 mg Oral Daily   Infusions:  . furosemide (LASIX) 200 mg in dextrose 5% 100 mL ( /mL) infusion Stopped (10/10/20 1454)    Labs: Recent Labs    10/10/20 0516 10/11/20 0429  NA 132* 131*  K 3.8 4.3  CL 98 95*  CO2 25 26  GLUCOSE 143* 146*  BUN 32* 33*  CREATININE 1.23* 1.19*  CALCIUM 8.0* 8.3*  MG 1.2* 1.7   No results for input(s):  AST, ALT, ALKPHOS, BILITOT, PROT, ALBUMIN in the last 72 hours. Recent Labs    10/10/20 0516 10/11/20 0429  WBC 3.9* 4.0  NEUTROABS 2.5 2.6  HGB 9.6* 9.8*  HCT 27.6* 29.7*  MCV 107.0* 107.6*  PLT 113* 114*   No results for input(s): CKTOTAL, CKMB, TROPONINI in the last 72 hours. Invalid input(s): POCBNP No results for input(s): HGBA1C in the last 72 hours.   Weights: Filed Weights   10/09/20 0500 10/10/20 0500 10/11/20 0459  Weight: 57.2 kg 55.2 kg 55.8 kg     Radiology/Studies:  DG  Chest Port 1 View  Result Date: 10/09/2020 CLINICAL DATA:  80 year old female status post left-sided thoracentesis. EXAM: PORTABLE CHEST 1 VIEW COMPARISON:  Chest x-ray 10/07/2020. FINDINGS: Previously noted left-sided pleural effusion has substantially decreased in size, either completely resolved or with a trace amount of remaining fluid. There does appear to be a trace right pleural effusion. No definite pneumothorax. No acute consolidative airspace disease. No evidence of pulmonary edema. Heart size is enlarged. Upper mediastinal contours are within normal limits. Aortic atherosclerosis. Status post median sternotomy for CABG. IMPRESSION: 1. Successful drainage of left-sided pleural effusion via thoracentesis with no pneumothorax or other acute complicating features. 2. Trace right pleural effusion. 3. Cardiomegaly. 4. Aortic atherosclerosis. Electronically Signed   By: Trudie Reed M.D.   On: 10/09/2020 11:36   DG Chest Portable 1 View  Result Date: 10/07/2020 CLINICAL DATA:  Short of breath EXAM: PORTABLE CHEST 1 VIEW COMPARISON:  04/26/2020, CT 05/04/2020 FINDINGS: Post sternotomy changes. Small left-sided pleural effusion with basilar airspace disease. Borderline to mild cardiomegaly. Aortic atherosclerosis. No pneumothorax. IMPRESSION: Small left pleural effusion with basilar airspace disease, atelectasis versus pneumonia. Electronically Signed   By: Jasmine Pang M.D.   On: 10/07/2020 17:45   ECHOCARDIOGRAM COMPLETE  Result Date: 10/08/2020    ECHOCARDIOGRAM REPORT   Patient Name:   Leah Small Date of Exam: 10/08/2020 Medical Rec #:  [id number]       Height:       63.0 in Accession #:    [id number]      Weight:       126.5 lb Date of Birth:  11-11-1940       BSA:          1.592 m Patient Age:    79 years        BP:           156/94 mmHg Patient Gender: F               HR:           98 bpm. Exam Location:  [facility] Procedure: 2D Echo, Cardiac Doppler and Color Doppler Indications:     Dyspnea  R06.00  History:         Patient has prior history of Echocardiogram examinations, most                  recent 04/29/2020. Previous Myocardial Infarction, TIA; Risk                  Factors:Diabetes.  Sonographer:     Cristela Blue RDCS (AE) Referring Phys:  [id number] AMY N COX Diagnosing Phys: Marcina Millard MD IMPRESSIONS  1. Left ventricular ejection fraction, by estimation, is 20 to 25%. The left ventricle has severely decreased function. The left ventricle demonstrates global hypokinesis. The left ventricular internal cavity size was moderately dilated. Left ventricular diastolic parameters were normal.  2. Right ventricular systolic  function is normal. The right ventricular size is normal.  3. Large pleural effusion in the left lateral region.  4. The mitral valve is normal in structure. Moderate to severe mitral valve regurgitation. No evidence of mitral stenosis.  5. Tricuspid valve regurgitation is moderate to severe.  6. The aortic valve is normal in structure. Aortic valve regurgitation is not visualized. No aortic stenosis is present.  7. The inferior vena cava is normal in size with greater than 50% respiratory variability, suggesting right atrial pressure of 3 mmHg. FINDINGS  Left Ventricle: Left ventricular ejection fraction, by estimation, is 20 to 25%. The left ventricle has severely decreased function. The left ventricle demonstrates global hypokinesis. The left ventricular internal cavity size was moderately dilated. There is no left ventricular hypertrophy. Left ventricular diastolic parameters were normal. Right Ventricle: The right ventricular size is normal. No increase in right ventricular wall thickness. Right ventricular systolic function is normal. Left Atrium: Left atrial size was normal in size. Right Atrium: Right atrial size was normal in size. Pericardium: There is no evidence of pericardial effusion. Mitral Valve: The mitral valve is normal in structure. Moderate to severe mitral  valve regurgitation. No evidence of mitral valve stenosis. MV peak gradient, 5.3 mmHg. The mean mitral valve gradient is 2.0 mmHg. Tricuspid Valve: The tricuspid valve is normal in structure. Tricuspid valve regurgitation is moderate to severe. No evidence of tricuspid stenosis. Aortic Valve: The aortic valve is normal in structure. Aortic valve regurgitation is not visualized. No aortic stenosis is present. Aortic valve mean gradient measures 2.0 mmHg. Aortic valve peak gradient measures 3.5 mmHg. Aortic valve area, by VTI measures 1.77 cm. Pulmonic Valve: The pulmonic valve was normal in structure. Pulmonic valve regurgitation is not visualized. No evidence of pulmonic stenosis. Aorta: The aortic root is normal in size and structure. Venous: The inferior vena cava is normal in size with greater than 50% respiratory variability, suggesting right atrial pressure of 3 mmHg. IAS/Shunts: No atrial level shunt detected by color flow Doppler. Additional Comments: There is a large pleural effusion in the left lateral region.  LEFT VENTRICLE PLAX 2D LVIDd:         4.38 cm LVIDs:         4.00 cm LV PW:         1.48 cm LV IVS:        0.77 cm LVOT diam:     2.00 cm LV SV:         22 LV SV Index:   14 LVOT Area:     3.14 cm  LV Volumes (MOD) LV vol d, MOD A2C: 107.0 ml LV vol d, MOD A4C: 72.3 ml LV vol s, MOD A2C: 77.3 ml LV vol s, MOD A4C: 59.0 ml LV SV MOD A2C:     29.7 ml LV SV MOD A4C:     72.3 ml LV SV MOD BP:      22.9 ml RIGHT VENTRICLE RV Basal diam:  5.19 cm RV S prime:     6.96 cm/s TAPSE (M-mode): 3.6 cm LEFT ATRIUM             Index       RIGHT ATRIUM           Index LA diam:        2.90 cm 1.82 cm/m  RA Area:     18.70 cm LA Vol (A2C):   84.8 ml 53.28 ml/m RA Volume:   55.90 ml  35.12 ml/m  LA Vol (A4C):   51.5 ml 32.36 ml/m LA Biplane Vol: 68.0 ml 42.73 ml/m  AORTIC VALVE                   PULMONIC VALVE AV Area (Vmax):    1.50 cm    PV Vmax:        0.50 m/s AV Area (Vmean):   1.43 cm    PV Peak grad:    1.0 mmHg AV Area (VTI):     1.77 cm    RVOT Peak grad: 1 mmHg AV Vmax:           94.20 cm/s AV Vmean:          65.267 cm/s AV VTI:            0.122 m AV Peak Grad:      3.5 mmHg AV Mean Grad:      2.0 mmHg LVOT Vmax:         45.00 cm/s LVOT Vmean:        29.800 cm/s LVOT VTI:          0.069 m LVOT/AV VTI ratio: 0.56  AORTA Ao Root diam: 2.50 cm MITRAL VALVE               TRICUSPID VALVE MV Area (PHT): 5.62 cm    TR Peak grad:   46.5 mmHg MV Area VTI:   1.11 cm    TR Vmax:        341.00 cm/s MV Peak grad:  5.3 mmHg MV Mean grad:  2.0 mmHg    SHUNTS MV Vmax:       1.15 m/s    Systemic VTI:  0.07 m MV Vmean:      67.6 cm/s   Systemic Diam: 2.00 cm MV Decel Time: 135 msec MV E velocity: 94.30 cm/s MV A velocity: 58.70 cm/s MV E/A ratio:  1.61 Marcina Millard MD Electronically signed by Marcina Millard MD Signature Date/Time: 10/08/2020/1:18:22 PM    Final    US THORACENTESIS ASP PLEURAL SPACE W/IMG GUIDE  Result Date: 10/09/2020 INDICATION: 80 year old with left pleural effusion. EXAM: ULTRASOUND GUIDED LEFT THORACENTESIS MEDICATIONS: None. COMPLICATIONS: None immediate. PROCEDURE: An ultrasound guided thoracentesis was thoroughly discussed with the patient and questions answered. The benefits, risks, alternatives and complications were also discussed. The patient understands and wishes to proceed with the procedure. Written consent was obtained. Ultrasound was performed to localize and mark an adequate pocket of fluid in the left chest. The area was then prepped and draped in the normal sterile fashion. 1% Lidocaine was used for local anesthesia. Under ultrasound guidance a 6 Fr Safe-T-Centesis catheter was introduced. Thoracentesis was performed. The catheter was removed and a dressing applied. FINDINGS: A total of approximately 475 of yellow serous fluid was removed. Samples were sent to the laboratory as requested by the clinical team. Small right pleural effusion identified with ultrasound.  IMPRESSION: Successful ultrasound guided left thoracentesis yielding 475 mL of pleural fluid. Electronically Signed   By: Richarda Overlie M.D.   On: 10/09/2020 11:16     Assessment and Recommendation  80 y.o. 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 1.  Reinstatement of Lasix drip today to help with further diuresis over the next 24 to 48 hours while hopefully not causing any hypotension and/or worsening chronic kidney disease 2.  No further cardiac diagnostics necessary at this time 3.  Ranexa for anginal symptoms which are common for her 4.  Ambulation as she wishes 5.  Further treatment options after above  Signed, Arnoldo Hooker M.D. FACC

## 2020-10-11 NOTE — Progress Notes (Signed)
[facility] PROGR[facility]    Leah FishmanDonnie R Small  ZOX:[id number] DOB: 08-08-1941 DOA: 10/07/2020 PCP: Leah ArbourSparks, Jeffrey D, MD      Brief Narrative:  Leah Small is a 80 y.o. F with CAD s/p CABG, last PCI 2017, hx CVA, HTN, DM and seizures who presented with shortness of breath for 1 week, leg swelling, and DOE.  In the ER, she was started on diuretics for CHF.               Assessment & Plan:  Acute on chronic systolic CHF Moderate to severe tricuspid and mitral regurgitation due to dilated cardiomyopathy Attempted Lasix drip yesterday, hypotension prevented this, she was net +800 cc yesterday Creatinine stable  -Resume Lasix drip -Daily BMP -Strict ins and outs, daily weights, telemetry -Daily monitoring renal function      Pleural effusion Thoracentesis 2/16, 500 cc removed, transudive by Lights, as expected in the circumstances  Diabetes with neuropathy Glucose slightly elevated -Continue sliding scale correction -Hold home Metformin glipizide -Continue gabapentin    Complex partial seizure None -Continue Keppra, Lamictal  Hypertension Secondary prevention of coronary disease Blood pressure today -Hold ramipril -Continue Ranexa, aspirin, atorvastatin, Plavix  Sacral pressure injury, stage II, present on admission  Severe protein calorie malnutrition As evidenced by weight loss down to 57 kg over the last year, reported poor oral intake, and severe loss of subcutaneous muscle mass and fat, as well as pressure injury.  Hypomagnesia Resolved  Anemia, normocytic, of chronic disease Most recent iron studies have been replete, hemoglobin stable  Other medications -Hold methotrexate for now            Disposition: Status is: Inpatient  Remains inpatient appropriate because:IV treatments appropriate due to intensity of illness or inability to take PO   Dispo: The patient is from: Home              Anticipated Small/c is to:  SNF              Anticipated Small/c date is: 2 days              Patient currently is not medically stable to Small/c.   Difficult to place patient No    Patient presented with congestive heart failure, she has had response to Lasix, but still is short of breath with exertion and swollen.  In addition, she has had soft blood pressure, worsening renal indices.  Cardiology were consulted and recommend Lasix drip as renal preserving method for further diuresis.  If this fails, patient may to be at her new baseline, and require some degree of hospice support at home.           Level of care: Med-Surg       MDM: The below labs and imaging reports were reviewed and summarized above.  Medication management as above.     DVT prophylaxis: enoxaparin (LOVENOX) injection 40 mg Start: 10/07/20 2100 Place TED hose Start: 10/07/20 2049  Code Status: DNR Family Communication: Son by phone   Procedures:   2/15 echocardiogram -- EF still 20-25%, mod-severe MR and TR           Subjective: No change in breathing, she is able to walk a little further today than yesterday.  Her legs are still swollen, no fever, chest pain, dizziness.       Objective: Vitals:   10/11/20 0722 10/11/20 0931 10/11/20 1136 10/11/20 1511  BP: 137/78 103/60 108/66 114/69  Pulse: 86 73  81 89  Resp: 16 16 16 16   Temp: (!) 97.5 F (36.4 C)  (!) 97.4 F (36.3 C) 97.6 F (36.4 C)  TempSrc: Oral  Oral Oral  SpO2: 97% 98% 100% 98%  Weight:      Height:        Intake/Output Summary (Last 24 hours) at 10/11/2020 1556 Last data filed at 10/11/2020 1500 Gross per 24 hour  Intake 960 ml  Output 800 ml  Net 160 ml   Filed Weights   10/09/20 0500 10/10/20 0500 10/11/20 0459  Weight: 57.2 kg 55.2 kg 55.8 kg    Examination: General appearance: Elderly female, lying in bed, interactive     HEENT:    Skin:  Cardiac: RRR, no murmurs appreciable, 1+ lower extremity edema Respiratory: Normal  respiratory rate and rhythm, I did not appreciate crackles on exam today, no wheezing Abdomen: Abdomen soft no tenderness palpation or guarding, no ascites or distention MSK:  Neuro: Awake and alert, extraocular movements intact, moves all extremities with normal strength and coordination, speech fluent Psych: Affect normal, judgment and insight appear normal       Data Reviewed: I have personally reviewed following labs and imaging studies:  CBC: Recent Labs  Lab 10/07/20 1756 10/08/20 0529 10/09/20 0512 10/10/20 0516 10/11/20 0429  WBC 6.5 4.4 3.5* 3.9* 4.0  NEUTROABS  --   --  1.9 2.5 2.6  HGB 10.6* 10.7* 9.5* 9.6* 9.8*  HCT 33.1* 32.6* 29.2* 27.6* 29.7*  MCV 113.0* 112.0* 110.2* 107.0* 107.6*  PLT 126* 133* 112* 113* 114*   Basic Metabolic Panel: Recent Labs  Lab 10/07/20 1756 10/08/20 0529 10/09/20 0512 10/10/20 0516 10/11/20 0429  NA 132* 133* 134* 132* 131*  K 6.0* 5.0 3.8 3.8 4.3  CL 102 102 101 98 95*  CO2 21* 20* 23 25 26   GLUCOSE 222* 163* 117* 143* 146*  BUN 35* 35* 32* 32* 33*  CREATININE 1.21* 1.13* 1.02* 1.23* 1.19*  CALCIUM 8.9 8.7* 8.4* 8.0* 8.3*  MG  --   --  1.1* 1.2* 1.7   GFR: Estimated Creatinine Clearance: 31.2 mL/min (A) (by C-G formula based on SCr of 1.19 mg/dL (H)). Liver Function Tests: Recent Labs  Lab 10/07/20 1756  AST 21  ALT 13  ALKPHOS 63  BILITOT 1.1  PROT 7.8  ALBUMIN 3.7   No results for input(s): LIPASE, AMYLASE in the last 168 hours. No results for input(s): AMMONIA in the last 168 hours. Coagulation Profile: No results for input(s): INR, PROTIME in the last 168 hours. Cardiac Enzymes: No results for input(s): CKTOTAL, CKMB, CKMBINDEX, TROPONINI in the last 168 hours. BNP (last 3 results) No results for input(s): PROBNP in the last 8760 hours. HbA1C: No results for input(s): HGBA1C in the last 72 hours. CBG: Recent Labs  Lab 10/10/20 1222 10/10/20 1652 10/10/20 2046 10/11/20 0724 10/11/20 1137  GLUCAP  249* 261* 196* 154* 209*   Lipid Profile: No results for input(s): CHOL, HDL, LDLCALC, TRIG, CHOLHDL, LDLDIRECT in the last 72 hours. Thyroid Function Tests: No results for input(s): TSH, T4TOTAL, FREET4, T3FREE, THYROIDAB in the last 72 hours. Anemia Panel: No results for input(s): VITAMINB12, FOLATE, FERRITIN, TIBC, IRON, RETICCTPCT in the last 72 hours. Urine analysis:    Component Value Date/Time   COLORURINE COLORLESS (A) 12/28/2019 1818   APPEARANCEUR CLEAR (A) 12/28/2019 1818   LABSPEC 1.014 12/28/2019 1818   PHURINE 6.0 12/28/2019 1818   GLUCOSEU 50 (A) 12/28/2019 1818   HGBUR NEGATIVE 12/28/2019 1818  BILIRUBINUR NEGATIVE 12/28/2019 1818   KETONESUR NEGATIVE 12/28/2019 1818   PROTEINUR NEGATIVE 12/28/2019 1818   UROBILINOGEN 0.2 07/06/2015 1558   NITRITE NEGATIVE 12/28/2019 1818   LEUKOCYTESUR NEGATIVE 12/28/2019 1818   Sepsis Labs: @LABRCNTIP (procalcitonin:4,lacticacidven:4)  ) Recent Results (from the past 240 hour(s))  Resp Panel by RT-PCR (Flu A&B, Covid) Nasopharyngeal Swab     Status: None   Collection Time: 10/07/20  5:58 PM   Specimen: Nasopharyngeal Swab; Nasopharyngeal(NP) swabs in vial transport medium  Result Value Ref Range Status   SARS Coronavirus 2 by RT PCR NEGATIVE NEGATIVE Final    Comment: (NOTE) SARS-CoV-2 target nucleic acids are NOT DETECTED.  The SARS-CoV-2 RNA is generally detectable in upper respiratory specimens during the acute phase of infection. The lowest concentration of SARS-CoV-2 viral copies this assay can detect is 138 copies/mL. A negative result does not preclude SARS-Cov-2 infection and should not be used as the sole basis for treatment or other patient management decisions. A negative result may occur with  improper specimen collection/handling, submission of specimen other than nasopharyngeal swab, presence of viral mutation(s) within the areas targeted by this assay, and inadequate number of viral copies(<138  copies/mL). A negative result must be combined with clinical observations, patient history, and epidemiological information. The expected result is Negative.  Fact Sheet for Patients:  10/09/20  Fact Sheet for Healthcare Providers:  BloggerCourse.com  This test is no t yet approved or cleared by the [employer]ious[employer].[place] FDA and  has been authorized for detection and/or diagnosis of SARS-CoV-2 by FDA under an Emergency Use Authorization (EUA). This EUA will remain  in effect (meaning this test can be used) for the duration of the COVID-19 declaration under Section 564(b)(1) of the Act, 21 U.S.C.section 360bbb-3(b)(1), unless the authorization is terminated  or revoked sooner.       Influenza A by PCR NEGATIVE NEGATIVE Final   Influenza B by PCR NEGATIVE NEGATIVE Final    Comment: (NOTE) The Xpert Xpress SARS-CoV-2/FLU/RSV plus assay is intended as an aid in the diagnosis of influenza from Nasopharyngeal swab specimens and should not be used as a sole basis for treatment. Nasal washings and aspirates are unacceptable for Xpert Xpress SARS-CoV-2/FLU/RSV testing.  Fact Sheet for Patients: [place]  Fact Sheet for Healthcare Providers: BloggerCourse.com  This test is not yet approved or cleared by the [employer]ious[employer]ker.[place] FDA and has been authorized for detection and/or diagnosis of SARS-CoV-2 by FDA under an Emergency Use Authorization (EUA). This EUA will remain in effect (meaning this test can be used) for the duration of the COVID-19 declaration under Section 564(b)(1) of the Act, 21 U.S.C. section 360bbb-3(b)(1), unless the authorization is terminated or revoked.  Performed at [facility], [address] [place], [place]   Body fluid culture w Gram Stain     Status: None (Preliminary result)   Collection Time: 10/09/20 11:05 AM   Specimen: PATH  Cytology Pleural fluid  Result Value Ref Range Status   Specimen Description   Final    PLEURAL Performed at [facility], [address] [place], [place]    Special Requests   Final    NONE Performed at [facility], [address] [place], [place]    Gram Stain NO WBC SEEN NO ORGANISMS SEEN   Final   Culture   Final    NO GROWTH 2 DAYS Performed at [facility], [address] [place], [place]    Report  Status PENDING  Incomplete         Radiology Studies: No results found.      Scheduled Meds: . ascorbic acid  500 mg Oral Daily  . aspirin EC  81 mg Oral QHS  . atorvastatin  40 mg Oral Daily  . brimonidine  1 drop Both Eyes BID  . brinzolamide  1 drop Both Eyes BID  . clopidogrel  75 mg Oral Daily  . enoxaparin (LOVENOX) injection  40 mg Subcutaneous Q24H  . feeding supplement  237 mL Oral BID BM  . folic acid  1 mg Oral Daily  . insulin aspart  0-5 Units Subcutaneous QHS  . insulin aspart  0-9 Units Subcutaneous TID WC  . lamoTRIgine  100 mg Oral Daily  . levETIRAcetam  500 mg Oral BID  . multivitamin with minerals  1 tablet Oral Daily  . potassium chloride  20 mEq Oral Daily  . ranolazine  500 mg Oral BID  . timolol  1 drop Both Eyes BID  . vitamin B-12  500 mcg Oral BH-q7a  . zinc sulfate  220 mg Oral Daily   Continuous Infusions: . furosemide (LASIX) 200 mg in dextrose 5% 100 mL (2mg /mL) infusion 4 mg/hr (10/11/20 0844)     LOS: 2 days    Time spent: 25 minutes    10/13/20, MD [facility] 10/11/2020, 3:56 PM     Please page though AMION or Epic secure chat:  For 10/13/2020, [employer]

## 2020-10-11 NOTE — Plan of Care (Signed)

## 2020-10-11 NOTE — Progress Notes (Signed)
Lasix gtt restarted at 0844. With BP 137/78.  0931 BP 103/60. Patient resting comfortably in bed without any needs at this time. MD Danford notified of BP via secure chat.

## 2020-10-11 NOTE — Progress Notes (Signed)
   10/11/20 1245  Clinical Encounter Type  Visited With Patient  Visit Type Initial;Spiritual support;Social support  Referral From Nurse  Consult/Referral To Chaplain  Spiritual Encounters  Spiritual Needs Prayer;Emotional  Chaplain Shakara Tweedy visited with Pt in room 246 A, Leah Small. Pt shared with me she has bad heart and her kidneys are failing her too. She stated, the doctor told me I may have a day or two or a week and he was blunt. I just want to talk to somebody without my son being present so I can vent and say how I really feel about what the doctor told me. I provided a listening ear, a ministry of presence, we held hands and prayed and we ended the visit by singing "Yes Jesus Loves Me." I advised Ms. Kippy that I will tell the other chaplains to come in and check on her this weekend, and I will be back on Sunday to see her again.

## 2020-10-11 NOTE — Care Management Important Message (Signed)
Important Message  Patient Details  Name: Leah Small MRN: [id number] Date of Birth: [birth year]   Medicare Important Message Given:  N/A - LOS <3 / Initial given by admissions  Initial Medicare IM reviewed with patient via room phone by Jennye Moccasin, Patient Access Associate on 10/10/2020 at 9:05am.   [name] 10/11/2020, 8:49 AM

## 2020-10-12 LAB — CBC WITH DIFFERENTIAL/PLATELET
Abs Immature Granulocytes: 0.01 10*3/uL (ref 0.00–0.07)
Basophils Absolute: 0 10*3/uL (ref 0.0–0.1)
Basophils Relative: 1 %
Eosinophils Absolute: 0.2 10*3/uL (ref 0.0–0.5)
Eosinophils Relative: 4 %
HCT: 28.8 % — ABNORMAL LOW (ref 36.0–46.0)
Hemoglobin: 10.1 g/dL — ABNORMAL LOW (ref 12.0–15.0)
Immature Granulocytes: 0 %
Lymphocytes Relative: 25 %
Lymphs Abs: 1 10*3/uL (ref 0.7–4.0)
MCH: 37.1 pg — ABNORMAL HIGH (ref 26.0–34.0)
MCHC: 35.1 g/dL (ref 30.0–36.0)
MCV: 105.9 fL — ABNORMAL HIGH (ref 80.0–100.0)
Monocytes Absolute: 0.4 10*3/uL (ref 0.1–1.0)
Monocytes Relative: 11 %
Neutro Abs: 2.3 10*3/uL (ref 1.7–7.7)
Neutrophils Relative %: 59 %
Platelets: 126 10*3/uL — ABNORMAL LOW (ref 150–400)
RBC: 2.72 MIL/uL — ABNORMAL LOW (ref 3.87–5.11)
RDW: 17.5 % — ABNORMAL HIGH (ref 11.5–15.5)
WBC: 3.9 10*3/uL — ABNORMAL LOW (ref 4.0–10.5)
nRBC: 0 % (ref 0.0–0.2)

## 2020-10-12 LAB — BASIC METABOLIC PANEL
Anion gap: 11 (ref 5–15)
BUN: 38 mg/dL — ABNORMAL HIGH (ref 8–23)
CO2: 26 mmol/L (ref 22–32)
Calcium: 8.4 mg/dL — ABNORMAL LOW (ref 8.9–10.3)
Chloride: 94 mmol/L — ABNORMAL LOW (ref 98–111)
Creatinine, Ser: 1.16 mg/dL — ABNORMAL HIGH (ref 0.44–1.00)
GFR, Estimated: 48 mL/min — ABNORMAL LOW (ref >60)
Glucose, Bld: 130 mg/dL — ABNORMAL HIGH (ref 70–99)
Potassium: 3.6 mmol/L (ref 3.5–5.1)
Sodium: 131 mmol/L — ABNORMAL LOW (ref 135–145)

## 2020-10-12 LAB — BODY FLUID CULTURE W GRAM STAIN
Culture: NO GROWTH
Gram Stain: NONE SEEN

## 2020-10-12 LAB — GLUCOSE, CAPILLARY
Glucose-Capillary: 147 mg/dL — ABNORMAL HIGH (ref 70–99)
Glucose-Capillary: 316 mg/dL — ABNORMAL HIGH (ref 70–99)
Glucose-Capillary: 289 mg/dL — ABNORMAL HIGH (ref 70–99)
Glucose-Capillary: 178 mg/dL — ABNORMAL HIGH (ref 70–99)

## 2020-10-12 MED ORDER — FUROSEMIDE 40 MG PO TABS
40.0000 mg | ORAL_TABLET | Freq: Every day | ORAL | Status: DC
  Administered 2020-10-13: 40 mg via ORAL
  Filled 2020-10-12: qty 1

## 2020-10-12 MED ORDER — GLIPIZIDE ER 5 MG PO TB24
5.0000 mg | ORAL_TABLET | Freq: Every day | ORAL | Status: DC
  Administered 2020-10-13: 5 mg via ORAL
  Filled 2020-10-12 (×2): qty 1

## 2020-10-12 NOTE — Progress Notes (Signed)
[facility] [place] [facility] Encounter Note  Patient: Leah Small / Admit Date: 10/07/2020 / Date of Encounter: 10/12/2020, 7:35 AM   Subjective: 2/18.  Patient had some amount of hypotension yesterday which is somewhat common for her although there was concerns about taking intravenous Lasix.  Therefore the Lasix was held last night until today.  She is sitting up eating her breakfast doing fine with a reasonable blood pressure today.  She does not have any blood pressure medications at this time but does take Ranexa which may cause some lower blood pressure at times.  She does continue to have bibasilar Rales for which she will need further treatment before discharge to home  2/19.  Patient tolerated Lasix drip with excellent urine output of greater than 2 L.  Patient does have improvements of current condition with no evidence of lower lung field Rales and edema is significantly improved.  There has been no evidence of worsening hypotension overnight.  The patient remains on Ranexa for chest discomfort but cannot tolerate any other medication management. Review of Systems: Positive for: Shortness of breath Negative for: Vision change, hearing change, syncope, dizziness, nausea, vomiting,diarrhea, bloody stool, stomach pain, cough, congestion, diaphoresis, urinary frequency, urinary pain,skin lesions, skin rashes Others previously listed  Objective: Telemetry: Normal sinus rhythm Physical Exam: Blood pressure 105/63, pulse 72, temperature 97.7 F (36.5 C), temperature source Oral, resp. rate 17, height 5\' 3"  (1.6 m), weight 57.8 kg, SpO2 97 %. Body mass index is 22.59 kg/m. General: Well developed, well nourished, in no acute distress. Head: Normocephalic, atraumatic, sclera non-icteric, no xanthomas, nares are without discharge. Neck: No apparent masses Lungs: Normal respirations with few wheezes, no rhonchi, no rales , few crackles   Heart: Regular rate and rhythm, normal S1 S2,  no murmur, no rub, no gallop, PMI is normal size and placement, carotid upstroke normal without bruit, jugular venous pressure normal Abdomen: Soft, non-tender, non-distended with normoactive bowel sounds. No hepatosplenomegaly. Abdominal aorta is normal size without bruit Extremities: Trace edema, no clubbing, no cyanosis, no ulcers,  Peripheral: 2+ radial, 2+ femoral, 2+ dorsal pedal pulses Neuro: Alert and oriented. Moves all extremities spontaneously. Psych:  Responds to questions appropriately with a normal affect.   Intake/Output Summary (Last 24 hours) at 10/12/2020 0735 Last data filed at 10/12/2020 0157 Gross per 24 hour  Intake 840 ml  Output 3200 ml  Net -2360 ml    Inpatient Medications:  . ascorbic acid  500 mg Oral Daily  . aspirin EC  81 mg Oral QHS  . atorvastatin  40 mg Oral Daily  . brimonidine  1 drop Both Eyes BID  . brinzolamide  1 drop Both Eyes BID  . clopidogrel  75 mg Oral Daily  . enoxaparin (LOVENOX) injection  40 mg Subcutaneous Q24H  . feeding supplement  237 mL Oral BID BM  . folic acid  1 mg Oral Daily  . insulin aspart  0-5 Units Subcutaneous QHS  . insulin aspart  0-9 Units Subcutaneous TID WC  . lamoTRIgine  100 mg Oral Daily  . levETIRAcetam  500 mg Oral BID  . multivitamin with minerals  1 tablet Oral Daily  . potassium chloride  20 mEq Oral Daily  . ranolazine  500 mg Oral BID  . timolol  1 drop Both Eyes BID  . vitamin B-12  500 mcg Oral BH-q7a  . zinc sulfate  220 mg Oral Daily   Infusions:  . furosemide (LASIX) 200 mg in dextrose 5% 100  mL (2mg /mL) infusion 4 mg/hr (10/11/20 0844)    Labs: Recent Labs    10/10/20 0516 10/11/20 0429 10/12/20 0501  NA 132* 131* 131*  K 3.8 4.3 3.6  CL 98 95* 94*  CO2 25 26 26   GLUCOSE 143* 146* 130*  BUN 32* 33* 38*  CREATININE 1.23* 1.19* 1.16*  CALCIUM 8.0* 8.3* 8.4*  MG 1.2* 1.7  --    No results for input(s): AST, ALT, ALKPHOS, BILITOT, PROT, ALBUMIN in the last 72 hours. Recent Labs     10/11/20 0429 10/12/20 0501  WBC 4.0 3.9*  NEUTROABS 2.6 2.3  HGB 9.8* 10.1*  HCT 29.7* 28.8*  MCV 107.6* 105.9*  PLT 114* 126*   No results for input(s): CKTOTAL, CKMB, TROPONINI in the last 72 hours. Invalid input(s): POCBNP No results for input(s): HGBA1C in the last 72 hours.   Weights: Filed Weights   10/10/20 0500 10/11/20 0459 10/12/20 0100  Weight: 55.2 kg 55.8 kg 57.8 kg     Radiology/Studies:  DG Chest Port 1 View  Result Date: 10/09/2020 CLINICAL DATA:  80 year old female status post left-sided thoracentesis. EXAM: PORTABLE CHEST 1 VIEW COMPARISON:  Chest x-ray 10/07/2020. FINDINGS: Previously noted left-sided pleural effusion has substantially decreased in size, either completely resolved or with a trace amount of remaining fluid. There does appear to be a trace right pleural effusion. No definite pneumothorax. No acute consolidative airspace disease. No evidence of pulmonary edema. Heart size is enlarged. Upper mediastinal contours are within normal limits. Aortic atherosclerosis. Status post median sternotomy for CABG. IMPRESSION: 1. Successful drainage of left-sided pleural effusion via thoracentesis with no pneumothorax or other acute complicating features. 2. Trace right pleural effusion. 3. Cardiomegaly. 4. Aortic atherosclerosis. Electronically Signed   By: Trudie Reedaniel  Entrikin M.D.   On: 10/09/2020 11:36   DG Chest Portable 1 View  Result Date: 10/07/2020 CLINICAL DATA:  Short of breath EXAM: PORTABLE CHEST 1 VIEW COMPARISON:  04/26/2020, CT 05/04/2020 FINDINGS: Post sternotomy changes. Small left-sided pleural effusion with basilar airspace disease. Borderline to mild cardiomegaly. Aortic atherosclerosis. No pneumothorax. IMPRESSION: Small left pleural effusion with basilar airspace disease, atelectasis versus pneumonia. Electronically Signed   By: Jasmine PangKim  Fujinaga M.D.   On: 10/07/2020 17:45   ECHOCARDIOGRAM COMPLETE  Result Date: 10/08/2020    ECHOCARDIOGRAM REPORT    Patient Name:   Leah FishmanDONNIE R Small Date of Exam: 10/08/2020 Medical Rec #:  [id number]       Height:       63.0 in Accession #:    [id number]      Weight:       126.5 lb Date of Birth:  [birth year]       BSA:          1.592 m Patient Age:    79 years        BP:           156/94 mmHg Patient Gender: F               HR:           98 bpm. Exam Location:  [facility] Procedure: 2D Echo, Cardiac Doppler and Color Doppler Indications:     Dyspnea R06.00  History:         Patient has prior history of Echocardiogram examinations, most                  recent 04/29/2020. Previous Myocardial Infarction, TIA; Risk  Factors:Diabetes.  Sonographer:     Cristela Blue RDCS (AE) Referring Phys:  [id number] AMY N COX Diagnosing Phys: Marcina Millard MD IMPRESSIONS  1. Left ventricular ejection fraction, by estimation, is 20 to 25%. The left ventricle has severely decreased function. The left ventricle demonstrates global hypokinesis. The left ventricular internal cavity size was moderately dilated. Left ventricular diastolic parameters were normal.  2. Right ventricular systolic function is normal. The right ventricular size is normal.  3. Large pleural effusion in the left lateral region.  4. The mitral valve is normal in structure. Moderate to severe mitral valve regurgitation. No evidence of mitral stenosis.  5. Tricuspid valve regurgitation is moderate to severe.  6. The aortic valve is normal in structure. Aortic valve regurgitation is not visualized. No aortic stenosis is present.  7. The inferior vena cava is normal in size with greater than 50% respiratory variability, suggesting right atrial pressure of 3 mmHg. FINDINGS  Left Ventricle: Left ventricular ejection fraction, by estimation, is 20 to 25%. The left ventricle has severely decreased function. The left ventricle demonstrates global hypokinesis. The left ventricular internal cavity size was moderately dilated. There is no left ventricular hypertrophy. Left ventricular  diastolic parameters were normal. Right Ventricle: The right ventricular size is normal. No increase in right ventricular wall thickness. Right ventricular systolic function is normal. Left Atrium: Left atrial size was normal in size. Right Atrium: Right atrial size was normal in size. Pericardium: There is no evidence of pericardial effusion. Mitral Valve: The mitral valve is normal in structure. Moderate to severe mitral valve regurgitation. No evidence of mitral valve stenosis. MV peak gradient, 5.3 mmHg. The mean mitral valve gradient is 2.0 mmHg. Tricuspid Valve: The tricuspid valve is normal in structure. Tricuspid valve regurgitation is moderate to severe. No evidence of tricuspid stenosis. Aortic Valve: The aortic valve is normal in structure. Aortic valve regurgitation is not visualized. No aortic stenosis is present. Aortic valve mean gradient measures 2.0 mmHg. Aortic valve peak gradient measures 3.5 mmHg. Aortic valve area, by VTI measures 1.77 cm. Pulmonic Valve: The pulmonic valve was normal in structure. Pulmonic valve regurgitation is not visualized. No evidence of pulmonic stenosis. Aorta: The aortic root is normal in size and structure. Venous: The inferior vena cava is normal in size with greater than 50% respiratory variability, suggesting right atrial pressure of 3 mmHg. IAS/Shunts: No atrial level shunt detected by color flow Doppler. Additional Comments: There is a large pleural effusion in the left lateral region.  LEFT VENTRICLE PLAX 2D LVIDd:         4.38 cm LVIDs:         4.00 cm LV PW:         1.48 cm LV IVS:        0.77 cm LVOT diam:     2.00 cm LV SV:         22 LV SV Index:   14 LVOT Area:     3.14 cm  LV Volumes (MOD) LV vol d, MOD A2C: 107.0 ml LV vol d, MOD A4C: 72.3 ml LV vol s, MOD A2C: 77.3 ml LV vol s, MOD A4C: 59.0 ml LV SV MOD A2C:     29.7 ml LV SV MOD A4C:     72.3 ml LV SV MOD BP:      22.9 ml RIGHT VENTRICLE RV Basal diam:  5.19 cm RV S prime:     6.96 cm/s TAPSE  (M-mode): 3.6 cm LEFT ATRIUM  Index       RIGHT ATRIUM           Index LA diam:        2.90 cm 1.82 cm/m  RA Area:     18.70 cm LA Vol (A2C):   84.8 ml 53.28 ml/m RA Volume:   55.90 ml  35.12 ml/m LA Vol (A4C):   51.5 ml 32.36 ml/m LA Biplane Vol: 68.0 ml 42.73 ml/m  AORTIC VALVE                   PULMONIC VALVE AV Area (Vmax):    1.50 cm    PV Vmax:        0.50 m/s AV Area (Vmean):   1.43 cm    PV Peak grad:   1.0 mmHg AV Area (VTI):     1.77 cm    RVOT Peak grad: 1 mmHg AV Vmax:           94.20 cm/s AV Vmean:          65.267 cm/s AV VTI:            0.122 m AV Peak Grad:      3.5 mmHg AV Mean Grad:      2.0 mmHg LVOT Vmax:         45.00 cm/s LVOT Vmean:        29.800 cm/s LVOT VTI:          0.069 m LVOT/AV VTI ratio: 0.56  AORTA Ao Root diam: 2.50 cm MITRAL VALVE               TRICUSPID VALVE MV Area (PHT): 5.62 cm    TR Peak grad:   46.5 mmHg MV Area VTI:   1.11 cm    TR Vmax:        341.00 cm/s MV Peak grad:  5.3 mmHg MV Mean grad:  2.0 mmHg    SHUNTS MV Vmax:       1.15 m/s    Systemic VTI:  0.07 m MV Vmean:      67.6 cm/s   Systemic Diam: 2.00 cm MV Decel Time: 135 msec MV E velocity: 94.30 cm/s MV A velocity: 58.70 cm/s MV E/A ratio:  1.61 Marcina Millard MD Electronically signed by Marcina Millard MD Signature Date/Time: 10/08/2020/1:18:22 PM    Final    US THORACENTESIS ASP PLEURAL SPACE W/IMG GUIDE  Result Date: 10/09/2020 INDICATION: 80 year old with left pleural effusion. EXAM: ULTRASOUND GUIDED LEFT THORACENTESIS MEDICATIONS: None. COMPLICATIONS: None immediate. PROCEDURE: An ultrasound guided thoracentesis was thoroughly discussed with the patient and questions answered. The benefits, risks, alternatives and complications were also discussed. The patient understands and wishes to proceed with the procedure. Written consent was obtained. Ultrasound was performed to localize and mark an adequate pocket of fluid in the left chest. The area was then prepped and draped in the  normal sterile fashion. 1% Lidocaine was used for local anesthesia. Under ultrasound guidance a 6 Fr Safe-T-Centesis catheter was introduced. Thoracentesis was performed. The catheter was removed and a dressing applied. FINDINGS: A total of approximately 475 of yellow serous fluid was removed. Samples were sent to the laboratory as requested by the clinical team. Small right pleural effusion identified with ultrasound. IMPRESSION: Successful ultrasound guided left thoracentesis yielding 475 mL of pleural fluid. Electronically Signed   By: Richarda Overlie M.D.   On: 10/09/2020 11:16     Assessment and Recommendation  80 y.o. 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 1.  Continuation of Lasix drip throughout the day and then discontinue later today for possible discharge tomorrow morning on oral Lasix at 40 mg each day 2.  No further cardiac diagnostics necessary at this time 3.  Ranexa for anginal symptoms which are common for her 4.  Ambulation as she wishes 5.  Further consideration of discharge tomorrow after slight more diuresis as per above  Signed, Arnoldo Hooker M.D. FACC

## 2020-10-12 NOTE — Plan of Care (Signed)

## 2020-10-12 NOTE — Progress Notes (Signed)
Physical Therapy Treatment Patient Details Name: Leah Small MRN: [id number] DOB: 1940-09-12 Today's Date: 10/12/2020    History of Present Illness 80 y.o. female with medical history significant for coronary artery disease status post CABG, valvular repair, CVA, hypertension, hyperlipidemia, non-insulin-dependent diabetes mellitus, GERD, seizures, TIA, neuropathy, presents to the emergency department for chief concerns of shortness of breath.     She endorses worsening shortness of breath for one week    PT Comments    Pt was long sitting in bed awake upon arriving. She is A an O and extremely pleasant. Agrees to PT session and OOB activity. Resting HR 84 with sao2 97% BP 119/61. She was able to exit bed without assistance, stand, and ambulate 60 ft in room without difficulty. Does endorse moderate fatigue but overall tolerated well. She was repositioned in recliner post session with call bell in reach and all needs met. Acute PT recommend DC home with HHPT to follow.    Follow Up Recommendations  Home health PT;Supervision - Intermittent     Equipment Recommendations  None recommended by PT    Recommendations for Other Services       Precautions / Restrictions Precautions Precautions: Fall    Mobility  Bed Mobility Overal bed mobility: Independent                  Transfers Overall transfer level: Modified independent                  Ambulation/Gait Ambulation/Gait assistance: Min guard;Supervision   Assistive device: Rolling walker (2 wheeled) Gait Pattern/deviations: Step-through pattern Gait velocity: decreased   General Gait Details: Vcs for staying inside RW. NO LOB or unsteadiness. distance limited by fatigue only       Balance Overall balance assessment: Modified Independent;Needs assistance   Sitting balance-Leahy Scale: Normal     Standing balance support: Bilateral upper extremity supported Standing balance-Leahy Scale:  Good Standing balance comment: no LOB during gait with BUE support throughout         Cognition Arousal/Alertness: Awake/alert Behavior During Therapy: WFL for tasks assessed/performed Overall Cognitive Status: Within Functional Limits for tasks assessed      General Comments: Pt is A and O and extremely pleasant        PT Goals (current goals can now be found in the care plan section) Acute Rehab PT Goals Patient Stated Goal: go home    Frequency    Min 2X/week      PT Plan Current plan remains appropriate       AM-PAC PT "6 Clicks" Mobility   Outcome Measure  Help needed turning from your back to your side while in a flat bed without using bedrails?: None Help needed moving from lying on your back to sitting on the side of a flat bed without using bedrails?: None Help needed moving to and from a bed to a chair (including a wheelchair)?: None Help needed standing up from a chair using your arms (e.g., wheelchair or bedside chair)?: None Help needed to walk in hospital room?: A Little Help needed climbing 3-5 steps with a railing? : A Little 6 Click Score: 22    End of Session Equipment Utilized During Treatment: Gait belt Activity Tolerance: Patient tolerated treatment well Patient left: in chair;with call bell/phone within reach;with chair alarm set Nurse Communication: Mobility status PT Visit Diagnosis: Muscle weakness (generalized) (M62.81);Difficulty in walking, not elsewhere classified (R26.2)     Time:  -  Charges:                        Julaine Fusi PTA 10/12/20, 7:44 AM

## 2020-10-12 NOTE — Progress Notes (Signed)
[facility] PROGRESS NOTE    Leah Small  KWI:[id number] DOB: 1941-05-11 DOA: 10/07/2020 PCP: Marguarite Arbour, MD      Brief Narrative:  Mrs. Leah Small is a 80 y.o. F with CAD s/p CABG, last PCI 2017, hx CVA, HTN, DM and seizures who presented with shortness of breath for 1 week, leg swelling, and DOE.  In the ER, she was started on diuretics for CHF.               Assessment & Plan:  Acute on chronic systolic CHF Moderate to severe tricuspid and right regurgitation due to dilated cardiomyopathy Strep yesterday, net -2.3 L, net -5.2 L over the last 5 days -Continue Lasix drip until this evening Start oral Lasix tomorrow, likely home -Continue strict ins and outs, daily BMP, daily weights       Pleural effusion Thoracentesis 2/16, 500 cc removed, transudive by Lights, as expected in the circumstances  Diabetes with neuropathy Glucose elevated -Resume glipizide -Continue sliding scale insulin corrections -Hold home Metformin -Continue gabapentin   History complex partial seizures No seizures here -Continue Keppra, Lamictal  Hypertension Secondary prevention of coronary disease Blood pressure normal today -Hold ramipril -Continue Ranexa, aspirin, atorvastatin, Plavix   Sacral pressure injury, stage II, present on admission  Severe protein calorie malnutrition As evidenced by weight loss down to 57 kg over the last year, reported poor oral intake, and severe loss of subcutaneous muscle mass and fat, as well as pressure injury.  Hypomagnesia Resolved  Anemia, normocytic, of chronic disease Most recent iron studies have been replete, hemoglobin stable  Other medications -Hold methotrexate for now            Disposition: Status is: Inpatient  Remains inpatient appropriate because:IV treatments appropriate due to intensity of illness or inability to take PO   Dispo: The patient is from: Home              Anticipated  d/c is to: SNF              Anticipated d/c date is: 1 day              Patient currently is not medically stable to d/c.   Difficult to place patient No    Patient presented with congestive heart failure, filter respond to IV Lasix without worsening renal failure -Switch to Lasix drip, and has had good diuresis, swelling improved, likely home tomorrow       Level of care: Med-Surg       MDM: The below labs and imaging reports were reviewed and summarized above.  Medication management as above.     DVT prophylaxis: enoxaparin (LOVENOX) injection 40 mg Start: 10/07/20 2100 Place TED hose Start: 10/07/20 2049  Code Status: DNR Family Communication: Son by phone   Procedures:   2/15 echocardiogram -- EF still 20-25%, mod-severe MR and TR           Subjective: Her breathing feels better, she has been more able to walk a little further, leg swelling is improved.  No chest pain, fever, dizziness, palpitation       Objective: Vitals:   10/12/20 0752 10/12/20 1124 10/12/20 1129 10/12/20 1555  BP: 134/72 (!) 89/49 101/60 135/80  Pulse: 79 80 81 86  Resp: 18 18  18   Temp: 97.8 F (36.6 C) 98.2 F (36.8 C)  (!) 97.5 F (36.4 C)  TempSrc: Oral Oral  Oral  SpO2: 98% 97%  97%  Weight:      Height:        Intake/Output Summary (Last 24 hours) at 10/12/2020 1635 Last data filed at 10/12/2020 1626 Gross per 24 hour  Intake 600 ml  Output 4050 ml  Net -3450 ml   Filed Weights   10/10/20 0500 10/11/20 0459 10/12/20 0100  Weight: 55.2 kg 55.8 kg 57.8 kg    Examination: General appearance: Frail elderly female, sleeping, arouses easily, interactive, pleasant     HEENT: Anicteric, conjunctival pink, lids and lashes normal.  No nasal deformity, discharge, or epistaxis. Skin: Pale Cardiac: Normal rate and rhythm, no murmurs appreciated, 1+ lower extremity edema, down to the ankles only today Respiratory: Normal respiratory rate and rhythm, no rales on exam,  no wheezing Abdomen: Abdomen soft no tenderness palpation or guarding MSK:  Neuro: Awake and alert, extraocular movements intact, moves all extremities with normal strength and coordination, speech Psych: Affect normal, judgment insight appear normal       Data Reviewed: I have personally reviewed following labs and imaging studies:  CBC: Recent Labs  Lab 10/08/20 0529 10/09/20 0512 10/10/20 0516 10/11/20 0429 10/12/20 0501  WBC 4.4 3.5* 3.9* 4.0 3.9*  NEUTROABS  --  1.9 2.5 2.6 2.3  HGB 10.7* 9.5* 9.6* 9.8* 10.1*  HCT 32.6* 29.2* 27.6* 29.7* 28.8*  MCV 112.0* 110.2* 107.0* 107.6* 105.9*  PLT 133* 112* 113* 114* 126*   Basic Metabolic Panel: Recent Labs  Lab 10/08/20 0529 10/09/20 0512 10/10/20 0516 10/11/20 0429 10/12/20 0501  NA 133* 134* 132* 131* 131*  K 5.0 3.8 3.8 4.3 3.6  CL 102 101 98 95* 94*  CO2 20* 23 25 26 26   GLUCOSE 163* 117* 143* 146* 130*  BUN 35* 32* 32* 33* 38*  CREATININE 1.13* 1.02* 1.23* 1.19* 1.16*  CALCIUM 8.7* 8.4* 8.0* 8.3* 8.4*  MG  --  1.1* 1.2* 1.7  --    GFR: Estimated Creatinine Clearance: 32 mL/min (A) (by C-G formula based on SCr of 1.16 mg/dL (H)). Liver Function Tests: Recent Labs  Lab 10/07/20 1756  AST 21  ALT 13  ALKPHOS 63  BILITOT 1.1  PROT 7.8  ALBUMIN 3.7   No results for input(s): LIPASE, AMYLASE in the last 168 hours. No results for input(s): AMMONIA in the last 168 hours. Coagulation Profile: No results for input(s): INR, PROTIME in the last 168 hours. Cardiac Enzymes: No results for input(s): CKTOTAL, CKMB, CKMBINDEX, TROPONINI in the last 168 hours. BNP (last 3 results) No results for input(s): PROBNP in the last 8760 hours. HbA1C: No results for input(s): HGBA1C in the last 72 hours. CBG: Recent Labs  Lab 10/11/20 1648 10/11/20 2029 10/12/20 0753 10/12/20 1129 10/12/20 1557  GLUCAP 281* 235* 147* 316* 289*   Lipid Profile: No results for input(s): CHOL, HDL, LDLCALC, TRIG, CHOLHDL, LDLDIRECT  in the last 72 hours. Thyroid Function Tests: No results for input(s): TSH, T4TOTAL, FREET4, T3FREE, THYROIDAB in the last 72 hours. Anemia Panel: No results for input(s): VITAMINB12, FOLATE, FERRITIN, TIBC, IRON, RETICCTPCT in the last 72 hours. Urine analysis:    Component Value Date/Time   COLORURINE COLORLESS (A) 12/28/2019 1818   APPEARANCEUR CLEAR (A) 12/28/2019 1818   LABSPEC 1.014 12/28/2019 1818   PHURINE 6.0 12/28/2019 1818   GLUCOSEU 50 (A) 12/28/2019 1818   HGBUR NEGATIVE 12/28/2019 1818   BILIRUBINUR NEGATIVE 12/28/2019 1818   KETONESUR NEGATIVE 12/28/2019 1818   PROTEINUR NEGATIVE 12/28/2019 1818   UROBILINOGEN 0.2 07/06/2015 1558   NITRITE NEGATIVE 12/28/2019  1818   LEUKOCYTESUR NEGATIVE 12/28/2019 1818   Sepsis Labs: @LABRCNTIP (procalcitonin:4,lacticacidven:4)  ) Recent Results (from the past 240 hour(s))  Resp Panel by RT-PCR (Flu A&B, Covid) Nasopharyngeal Swab     Status: None   Collection Time: 10/07/20  5:58 PM   Specimen: Nasopharyngeal Swab; Nasopharyngeal(NP) swabs in vial transport medium  Result Value Ref Range Status   SARS Coronavirus 2 by RT PCR NEGATIVE NEGATIVE Final    Comment: (NOTE) SARS-CoV-2 target nucleic acids are NOT DETECTED.  The SARS-CoV-2 RNA is generally detectable in upper respiratory specimens during the acute phase of infection. The lowest concentration of SARS-CoV-2 viral copies this assay can detect is 138 copies/mL. A negative result does not preclude SARS-Cov-2 infection and should not be used as the sole basis for treatment or other patient management decisions. A negative result may occur with  improper specimen collection/handling, submission of specimen other than nasopharyngeal swab, presence of viral mutation(s) within the areas targeted by this assay, and inadequate number of viral copies(<138 copies/mL). A negative result must be combined with clinical observations, patient history, and  epidemiological information. The expected result is Negative.  Fact Sheet for Patients:  10/09/20  Fact Sheet for Healthcare Providers:  BloggerCourse.com  This test is no t yet approved or cleared by the [employer] FDA and  has been authorized for detection and/or diagnosis of SARS-CoV-2 by FDA under an Emergency Use Authorization (EUA). This EUA will remain  in effect (meaning this test can be used) for the duration of the COVID-19 declaration under Section 564(b)(1) of the Act, 21 U.S.C.section 360bbb-3(b)(1), unless the authorization is terminated  or revoked sooner.       Influenza A by PCR NEGATIVE NEGATIVE Final   Influenza B by PCR NEGATIVE NEGATIVE Final    Comment: (NOTE) The Xpert Xpress SARS-CoV-2/FLU/RSV plus assay is intended as an aid in the diagnosis of influenza from Nasopharyngeal swab specimens and should not be used as a sole basis for treatment. Nasal washings and aspirates are unacceptable for Xpert Xpress SARS-CoV-2/FLU/RSV testing.  Fact Sheet for Patients: [place]  Fact Sheet for Healthcare Providers: BloggerCourse.com  This test is not yet approved or cleared by the [employer] FDA and has been authorized for detection and/or diagnosis of SARS-CoV-2 by FDA under an Emergency Use Authorization (EUA). This EUA will remain in effect (meaning this test can be used) for the duration of the COVID-19 declaration under Section 564(b)(1) of the Act, 21 U.S.C. section 360bbb-3(b)(1), unless the authorization is terminated or revoked.  Performed at [facility], [address] [place], [place]   Body fluid culture w Gram Stain     Status: None   Collection Time: 10/09/20 11:05 AM   Specimen: PATH Cytology Pleural fluid  Result Value Ref Range Status   Specimen Description   Final    PLEURAL Performed at [facility], [address] [place], [place]    Special Requests   Final    NONE Performed at [facility], [address] [place], [place]    Gram Stain NO WBC SEEN NO ORGANISMS SEEN   Final   Culture   Final    NO GROWTH Performed at [facility], [address], [place], [place]    Report Status 10/12/2020 FINAL  Final         Radiology Studies: No results found.      Scheduled Meds: . ascorbic acid  500 mg  Oral Daily  . aspirin EC  81 mg Oral QHS  . atorvastatin  40 mg Oral Daily  . brimonidine  1 drop Both Eyes BID  . brinzolamide  1 drop Both Eyes BID  . clopidogrel  75 mg Oral Daily  . enoxaparin (LOVENOX) injection  40 mg Subcutaneous Q24H  . feeding supplement  237 mL Oral BID BM  . folic acid  1 mg Oral Daily  . [START ON 10/13/2020] furosemide  40 mg Oral Daily  . insulin aspart  0-5 Units Subcutaneous QHS  . insulin aspart  0-9 Units Subcutaneous TID WC  . lamoTRIgine  100 mg Oral Daily  . levETIRAcetam  500 mg Oral BID  . multivitamin with minerals  1 tablet Oral Daily  . potassium chloride  20 mEq Oral Daily  . ranolazine  500 mg Oral BID  . timolol  1 drop Both Eyes BID  . vitamin B-12  500 mcg Oral BH-[id number]  . zinc sulfate  220 mg Oral Daily   Continuous Infusions: . furosemide (LASIX) 200 mg in dextrose 5% 100 mL (2mg /mL) infusion 4 mg/hr (10/11/20 0844)     LOS: 3 days    Time spent: 25 minutes    Alberteen Samhristopher P Kitzia Camus, MD [facility] 10/12/2020, 4:35 PM     Please page though AMION or Epic secure chat:  For [employer] password, Higher education careers advisercontact charge nurse

## 2020-10-13 LAB — BASIC METABOLIC PANEL
Anion gap: 10 (ref 5–15)
BUN: 36 mg/dL — ABNORMAL HIGH (ref 8–23)
CO2: 27 mmol/L (ref 22–32)
Calcium: 8.5 mg/dL — ABNORMAL LOW (ref 8.9–10.3)
Chloride: 94 mmol/L — ABNORMAL LOW (ref 98–111)
Creatinine, Ser: 1.14 mg/dL — ABNORMAL HIGH (ref 0.44–1.00)
GFR, Estimated: 49 mL/min — ABNORMAL LOW (ref >60)
Glucose, Bld: 151 mg/dL — ABNORMAL HIGH (ref 70–99)
Potassium: 3.5 mmol/L (ref 3.5–5.1)
Sodium: 131 mmol/L — ABNORMAL LOW (ref 135–145)

## 2020-10-13 LAB — CBC WITH DIFFERENTIAL/PLATELET
Abs Immature Granulocytes: 0.02 10*3/uL (ref 0.00–0.07)
Basophils Absolute: 0 10*3/uL (ref 0.0–0.1)
Basophils Relative: 1 %
Eosinophils Absolute: 0.1 10*3/uL (ref 0.0–0.5)
Eosinophils Relative: 3 %
HCT: 27.8 % — ABNORMAL LOW (ref 36.0–46.0)
Hemoglobin: 9.7 g/dL — ABNORMAL LOW (ref 12.0–15.0)
Immature Granulocytes: 1 %
Lymphocytes Relative: 28 %
Lymphs Abs: 0.9 10*3/uL (ref 0.7–4.0)
MCH: 37 pg — ABNORMAL HIGH (ref 26.0–34.0)
MCHC: 34.9 g/dL (ref 30.0–36.0)
MCV: 106.1 fL — ABNORMAL HIGH (ref 80.0–100.0)
Monocytes Absolute: 0.4 10*3/uL (ref 0.1–1.0)
Monocytes Relative: 11 %
Neutro Abs: 1.9 10*3/uL (ref 1.7–7.7)
Neutrophils Relative %: 56 %
Platelets: 119 10*3/uL — ABNORMAL LOW (ref 150–400)
RBC: 2.62 MIL/uL — ABNORMAL LOW (ref 3.87–5.11)
RDW: 17.2 % — ABNORMAL HIGH (ref 11.5–15.5)
WBC: 3.3 10*3/uL — ABNORMAL LOW (ref 4.0–10.5)
nRBC: 0 % (ref 0.0–0.2)

## 2020-10-13 LAB — GLUCOSE, CAPILLARY
Glucose-Capillary: 156 mg/dL — ABNORMAL HIGH (ref 70–99)
Glucose-Capillary: 302 mg/dL — ABNORMAL HIGH (ref 70–99)

## 2020-10-13 MED ORDER — POTASSIUM CHLORIDE CRYS ER 10 MEQ PO TBCR: 10.0000 meq | EXTENDED_RELEASE_TABLET | Freq: Every day | ORAL | 3 refills | Status: AC

## 2020-10-13 MED ORDER — FUROSEMIDE 40 MG PO TABS: 40.0000 mg | ORAL_TABLET | Freq: Every day | ORAL | 3 refills | Status: AC

## 2020-10-13 NOTE — Progress Notes (Signed)
[facility] Encounter Note  Patient: Leah Small / Admit Date: 10/07/2020 / Date of Encounter: 10/13/2020, 6:36 AM   Subjective: 2/18.  Patient had some amount of hypotension yesterday which is somewhat common for her although there was concerns about taking intravenous Lasix.  Therefore the Lasix was held last night until today.  She is sitting up eating her breakfast doing fine with a reasonable blood pressure today.  She does not have any blood pressure medications at this time but does take Ranexa which may cause some lower blood pressure at times.  She does continue to have bibasilar Rales for which she will need further treatment before discharge to home  2/19.  Patient tolerated Lasix drip with excellent urine output of greater than 2 L.  Patient does have improvements of current condition with no evidence of lower lung field Rales and edema is significantly improved.  There has been no evidence of worsening hypotension overnight.  The patient remains on Ranexa for chest discomfort but cannot tolerate any other medication management.  2/20.  Patient has tolerated intravenous Lasix drip with another significant urine output session yesterday.  Patient now appears to be back to baseline with reasonable blood pressure control and able to do the things she wishes without any chest pain on Ranexa.  Patient has tolerated furosemide orally in the past.  No current evidence of further concern  Review of Systems: Positive for: Shortness of breath Negative for: Vision change, hearing change, syncope, dizziness, nausea, vomiting,diarrhea, bloody stool, stomach pain, cough, congestion, diaphoresis, urinary frequency, urinary pain,skin lesions, skin rashes Others previously listed  Objective: Telemetry: Normal sinus rhythm Physical Exam: Blood pressure (!) 102/59, pulse 72, temperature 98.3 F (36.8 C), resp. rate 17, height 5\' 3"  (1.6 m), weight 56.3 kg, SpO2 100 %. Body mass  index is 21.98 kg/m. General: Well developed, well nourished, in no acute distress. Head: Normocephalic, atraumatic, sclera non-icteric, no xanthomas, nares are without discharge. Neck: No apparent masses Lungs: Normal respirations with few wheezes, no rhonchi, no rales , few crackles   Heart: Regular rate and rhythm, normal S1 S2, no murmur, no rub, no gallop, PMI is normal size and placement, carotid upstroke normal without bruit, jugular venous pressure normal Abdomen: Soft, non-tender, non-distended with normoactive bowel sounds. No hepatosplenomegaly. Abdominal aorta is normal size without bruit Extremities: Trace edema, no clubbing, no cyanosis, no ulcers,  Peripheral: 2+ radial, 2+ femoral, 2+ dorsal pedal pulses Neuro: Alert and oriented. Moves all extremities spontaneously. Psych:  Responds to questions appropriately with a normal affect.   Intake/Output Summary (Last 24 hours) at 10/13/2020 0636 Last data filed at 10/12/2020 1905 Gross per 24 hour  Intake 720 ml  Output 1650 ml  Net -930 ml    Inpatient Medications:  . ascorbic acid  500 mg Oral Daily  . aspirin EC  81 mg Oral QHS  . atorvastatin  40 mg Oral Daily  . brimonidine  1 drop Both Eyes BID  . brinzolamide  1 drop Both Eyes BID  . clopidogrel  75 mg Oral Daily  . enoxaparin (LOVENOX) injection  40 mg Subcutaneous Q24H  . feeding supplement  237 mL Oral BID BM  . folic acid  1 mg Oral Daily  . furosemide  40 mg Oral Daily  . glipiZIDE  5 mg Oral Q breakfast  . insulin aspart  0-5 Units Subcutaneous QHS  . insulin aspart  0-9 Units Subcutaneous TID WC  . lamoTRIgine  100 mg Oral  Daily  . levETIRAcetam  500 mg Oral BID  . multivitamin with minerals  1 tablet Oral Daily  . potassium chloride  20 mEq Oral Daily  . ranolazine  500 mg Oral BID  . timolol  1 drop Both Eyes BID  . vitamin B-12  500 mcg Oral BH-q7a  . zinc sulfate  220 mg Oral Daily   Infusions:    Labs: Recent Labs    10/11/20 0429  10/12/20 0501 10/13/20 0524  NA 131* 131* 131*  K 4.3 3.6 3.5  CL 95* 94* 94*  CO2 26 26 27   GLUCOSE 146* 130* 151*  BUN 33* 38* 36*  CREATININE 1.19* 1.16* 1.14*  CALCIUM 8.3* 8.4* 8.5*  MG 1.7  --   --    No results for input(s): AST, ALT, ALKPHOS, BILITOT, PROT, ALBUMIN in the last 72 hours. Recent Labs    10/12/20 0501 10/13/20 0524  WBC 3.9* 3.3*  NEUTROABS 2.3 1.9  HGB 10.1* 9.7*  HCT 28.8* 27.8*  MCV 105.9* 106.1*  PLT 126* 119*   No results for input(s): CKTOTAL, CKMB, TROPONINI in the last 72 hours. Invalid input(s): POCBNP No results for input(s): HGBA1C in the last 72 hours.   Weights: Filed Weights   10/11/20 0459 10/12/20 0100 10/13/20 0100  Weight: 55.8 kg 57.8 kg 56.3 kg     Radiology/Studies:  DG Chest Port 1 View  Result Date: 10/09/2020 CLINICAL DATA:  80 year old female status post left-sided thoracentesis. EXAM: PORTABLE CHEST 1 VIEW COMPARISON:  Chest x-ray 10/07/2020. FINDINGS: Previously noted left-sided pleural effusion has substantially decreased in size, either completely resolved or with a trace amount of remaining fluid. There does appear to be a trace right pleural effusion. No definite pneumothorax. No acute consolidative airspace disease. No evidence of pulmonary edema. Heart size is enlarged. Upper mediastinal contours are within normal limits. Aortic atherosclerosis. Status post median sternotomy for CABG. IMPRESSION: 1. Successful drainage of left-sided pleural effusion via thoracentesis with no pneumothorax or other acute complicating features. 2. Trace right pleural effusion. 3. Cardiomegaly. 4. Aortic atherosclerosis. Electronically Signed   By: 10/09/2020 M.D.   On: 10/09/2020 11:36   DG Chest Portable 1 View  Result Date: 10/07/2020 CLINICAL DATA:  Short of breath EXAM: PORTABLE CHEST 1 VIEW COMPARISON:  04/26/2020, CT 05/04/2020 FINDINGS: Post sternotomy changes. Small left-sided pleural effusion with basilar airspace disease.  Borderline to mild cardiomegaly. Aortic atherosclerosis. No pneumothorax. IMPRESSION: Small left pleural effusion with basilar airspace disease, atelectasis versus pneumonia. Electronically Signed   By: 07/04/2020 M.D.   On: 10/07/2020 17:45   ECHOCARDIOGRAM COMPLETE  Result Date: 10/08/2020    ECHOCARDIOGRAM REPORT   Patient Name:   Leah Small Date of Exam: 10/08/2020 Medical Rec #:  10/10/2020       Height:       63.0 in Accession #:    [id number]      Weight:       126.5 lb Date of Birth:  October 07, 1940       BSA:          1.592 m Patient Age:    79 years        BP:           156/94 mmHg Patient Gender: F               HR:           98 bpm. Exam Location:  [facility] Procedure: 2D Echo, Cardiac Doppler and Color  Doppler Indications:     Dyspnea R06.00  History:         Patient has prior history of Echocardiogram examinations, most                  recent 04/29/2020. Previous Myocardial Infarction, TIA; Risk                  Factors:Diabetes.  Sonographer:     Cristela Blue RDCS (AE) Referring Phys:  [id number] AMY N [facility] Diagnosing Phys: Marcina Millard MD IMPRESSIONS  1. Left ventricular ejection fraction, by estimation, is 20 to 25%. The left ventricle has severely decreased function. The left ventricle demonstrates global hypokinesis. The left ventricular internal cavity size was moderately dilated. Left ventricular diastolic parameters were normal.  2. Right ventricular systolic function is normal. The right ventricular size is normal.  3. Large pleural effusion in the left lateral region.  4. The mitral valve is normal in structure. Moderate to severe mitral valve regurgitation. No evidence of mitral stenosis.  5. Tricuspid valve regurgitation is moderate to severe.  6. The aortic valve is normal in structure. Aortic valve regurgitation is not visualized. No aortic stenosis is present.  7. The inferior vena cava is normal in size with greater than 50% respiratory variability, suggesting right atrial pressure of  3 mmHg. FINDINGS  Left Ventricle: Left ventricular ejection fraction, by estimation, is 20 to 25%. The left ventricle has severely decreased function. The left ventricle demonstrates global hypokinesis. The left ventricular internal cavity size was moderately dilated. There is no left ventricular hypertrophy. Left ventricular diastolic parameters were normal. Right Ventricle: The right ventricular size is normal. No increase in right ventricular wall thickness. Right ventricular systolic function is normal. Left Atrium: Left atrial size was normal in size. Right Atrium: Right atrial size was normal in size. Pericardium: There is no evidence of pericardial effusion. Mitral Valve: The mitral valve is normal in structure. Moderate to severe mitral valve regurgitation. No evidence of mitral valve stenosis. MV peak gradient, 5.3 mmHg. The mean mitral valve gradient is 2.0 mmHg. Tricuspid Valve: The tricuspid valve is normal in structure. Tricuspid valve regurgitation is moderate to severe. No evidence of tricuspid stenosis. Aortic Valve: The aortic valve is normal in structure. Aortic valve regurgitation is not visualized. No aortic stenosis is present. Aortic valve mean gradient measures 2.0 mmHg. Aortic valve peak gradient measures 3.5 mmHg. Aortic valve area, by VTI measures 1.77 cm. Pulmonic Valve: The pulmonic valve was normal in structure. Pulmonic valve regurgitation is not visualized. No evidence of pulmonic stenosis. Aorta: The aortic root is normal in size and structure. Venous: The inferior vena cava is normal in size with greater than 50% respiratory variability, suggesting right atrial pressure of 3 mmHg. IAS/Shunts: No atrial level shunt detected by color flow Doppler. Additional Comments: There is a large pleural effusion in the left lateral region.  LEFT VENTRICLE PLAX 2D LVIDd:         4.38 cm LVIDs:         4.00 cm LV PW:         1.48 cm LV IVS:        0.77 cm LVOT diam:     2.00 cm LV SV:         22  LV SV Index:   14 LVOT Area:     3.14 cm  LV Volumes (MOD) LV vol d, MOD A2C: 107.0 ml LV vol d, MOD A4C: 72.3 ml LV vol s,  MOD A2C: 77.3 ml LV vol s, MOD A4C: 59.0 ml LV SV MOD A2C:     29.7 ml LV SV MOD A4C:     72.3 ml LV SV MOD BP:      22.9 ml RIGHT VENTRICLE RV Basal diam:  5.19 cm RV S prime:     6.96 cm/s TAPSE (M-mode): 3.6 cm LEFT ATRIUM             Index       RIGHT ATRIUM           Index LA diam:        2.90 cm 1.82 cm/m  RA Area:     18.70 cm LA Vol (A2C):   84.8 ml 53.28 ml/m RA Volume:   55.90 ml  35.12 ml/m LA Vol (A4C):   51.5 ml 32.36 ml/m LA Biplane Vol: 68.0 ml 42.73 ml/m  AORTIC VALVE                   PULMONIC VALVE AV Area (Vmax):    1.50 cm    PV Vmax:        0.50 m/s AV Area (Vmean):   1.43 cm    PV Peak grad:   1.0 mmHg AV Area (VTI):     1.77 cm    RVOT Peak grad: 1 mmHg AV Vmax:           94.20 cm/s AV Vmean:          65.267 cm/s AV VTI:            0.122 m AV Peak Grad:      3.5 mmHg AV Mean Grad:      2.0 mmHg LVOT Vmax:         45.00 cm/s LVOT Vmean:        29.800 cm/s LVOT VTI:          0.069 m LVOT/AV VTI ratio: 0.56  AORTA Ao Root diam: 2.50 cm MITRAL VALVE               TRICUSPID VALVE MV Area (PHT): 5.62 cm    TR Peak grad:   46.5 mmHg MV Area VTI:   1.11 cm    TR Vmax:        341.00 cm/s MV Peak grad:  5.3 mmHg MV Mean grad:  2.0 mmHg    SHUNTS MV Vmax:       1.15 m/s    Systemic VTI:  0.07 m MV Vmean:      67.6 cm/s   Systemic Diam: 2.00 cm MV Decel Time: 135 msec MV E velocity: 94.30 cm/s MV A velocity: 58.70 cm/s MV E/A ratio:  1.61 Marcina MillardAlexander Paraschos MD Electronically signed by Marcina MillardAlexander Paraschos MD Signature Date/Time: 10/08/2020/1:18:22 PM    Final    US THORACENTESIS ASP PLEURAL SPACE W/IMG GUIDE  Result Date: 10/09/2020 INDICATION: 80 year old with left pleural effusion. EXAM: ULTRASOUND GUIDED LEFT THORACENTESIS MEDICATIONS: None. COMPLICATIONS: None immediate. PROCEDURE: An ultrasound guided thoracentesis was thoroughly discussed with the patient and  questions answered. The benefits, risks, alternatives and complications were also discussed. The patient understands and wishes to proceed with the procedure. Written consent was obtained. Ultrasound was performed to localize and mark an adequate pocket of fluid in the left chest. The area was then prepped and draped in the normal sterile fashion. 1% Lidocaine was used for local anesthesia. Under ultrasound guidance a 6 Fr Safe-T-Centesis catheter was introduced. Thoracentesis was performed. The catheter was  removed and a dressing applied. FINDINGS: A total of approximately 475 of yellow serous fluid was removed. Samples were sent to the laboratory as requested by the clinical team. Small right pleural effusion identified with ultrasound. IMPRESSION: Successful ultrasound guided left thoracentesis yielding 475 mL of pleural fluid. Electronically Signed   By: Richarda Overlie M.D.   On: 10/09/2020 11:16     Assessment and Recommendation  80 y.o. 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 1.  Continuation of oral furosemide 40 mg each day for congestive heart failure 2.  No further cardiac diagnostics necessary at this time 3.  Ranexa for anginal symptoms which are common for her 4.  Ambulation as she wishes 5.  Okay for discharge home from cardiac standpoint at this time with follow-up in 2 to 4 weeks  Signed, Arnoldo Hooker M.D. FACC

## 2020-10-13 NOTE — Progress Notes (Signed)
Patient given discharge instructions and reviewed medications. Reviewed heart failure education and s/s and when to call doctor with patient and patient's son. Patient's IVs removed. Patient escorted by volunteer services via wheelchair with all patient belongings.

## 2020-10-13 NOTE — Discharge Summary (Signed)
Physician Discharge Summary  Cedric FishmanDonnie R Cousins XBM:[id number] DOB: 1940/11/29 DOA: 10/07/2020  PCP: Marguarite ArbourSparks, Jeffrey D, MD  Admit date: 10/07/2020 Discharge date: 10/13/2020  Admitted From: Home  Dis[facility]:  [facility]   Recommendations for Outpatient Follow-up:  1. Follow up with PCP Dr. Judithann SheenSparks in 1 week 2. Dr. Judithann SheenSparks: Please obtain BMP this Thursday 3. Follow up with Dr. [facility]als[facility] in 1-2 weeks     Home Health: PT/OT due to shortness of breath with exertion  Equipment/Devices: None new  Discharge Condition: Fair  CODE STATUS: DNR Diet recommendation: Low sodium  Brief/Interim Summary: Leah Small is an 80 y.o. F with CAD s/p CABG, last PCI 2017, hx CVA, HTN, DM and seizures who presented with shortness of breath for 1 week, leg swelling, and DOE.  In the ER, she was started on diuretics for CHF.         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  There was initially some difficulty with worsening creatinine, so her ramipril was stopped, and she was transitioned to continuous Lasix infusion.  She was able to diurese 5 L, her pulmonary rales resolved, and her dyspnea on exertion improved closer to her baseline.     Pleural effusion Thoracentesis 2/16, 500 cc removed, transudive by Lights, as expected in the circumstances   Diabetes with neuropathy Continue glipizide, Metformin, and Neurontin.  History complex partial seizures Quiescsent.  Continue Keppra, Lamictal  Hypertension Secondary prevention of coronary disease Ramipril stopped.  Ranexa, aspirin, atorvastatin, and Plavix continued.     Sacral pressure injury, stage II, present on admission  Severe protein calorie malnutrition As evidenced by weight loss down to 57 kg over the  last year, reported poor oral intake, and severe loss of subcutaneous muscle mass and fat, as well as pressure injury.  Hypomagnesia Resolved  Anemia, normocytic, of chronic disease Most recent iron studies have been replete, hemoglobin stable              Discharge Instructions  Discharge Instructions    Diet - low sodium heart healthy   Complete by: As directed    Discharge instructions   Complete by: As directed    From Dr. Maryfrances Bunnellanford: You were admitted with symptoms of congestive heart failure (feeling out of breath, leg swelling)  Here, your labs and imaging confirmed it.  You were treated with a "Lasix drip" and this helped  START furosemide/Lasix 40 mg daily each day at 10am Take it with potassium  Go get labs checked by Dr. Judithann SheenSparks this Thursday to check kidney function and potassium  STOP taking ramipril for now  Resume your other home medicines without change  Go see Dr. Gwen PoundsKowalski in 1-2 weeks  Also, adhere to a low sodium diet AND weigh yourself today when you get home (this is your "dry weight") THen weigh yourself every day in the morning before breakfast  IF your weight is every MORE than 5 lbs over your DRY WEIGHT, call Dr. Gwen PoundsKowalski immediately   Increase activity slowly   Complete by: As directed    No wound care   Complete by: As directed      Allergies as of 10/13/2020      Reactions   Adhesive [tape] Other (See Comments)   Bruising and TEARING!!!  Please use "paper" tape  Talwin [pentazocine] Anaphylaxis, Swelling   Throat swelling   Valium [diazepam] Other (See Comments)   Makes her feel like she's flying   Vicodin [hydrocodone-acetaminophen] Nausea And Vomiting   Beta Adrenergic Blockers Other (See Comments)   Pt states she not allergic   Levofloxacin Swelling, Other (See Comments)   Tongue swells   Simvastatin Swelling, Rash   Mouth swelling      Medication List    STOP taking these medications   ascorbic acid 500 MG  tablet Commonly known as: VITAMIN C   folic acid 1 MG tablet Commonly known as: FOLVITE   gabapentin 100 MG capsule Commonly known as: NEURONTIN   ramipril 5 MG capsule Commonly known as: ALTACE   zinc sulfate 220 (50 Zn) MG capsule     TAKE these medications   aspirin EC 81 MG tablet Take 81 mg by mouth at bedtime.   atorvastatin 40 MG tablet Commonly known as: LIPITOR Take 40 mg by mouth daily.   clopidogrel 75 MG tablet Commonly known as: PLAVIX Take 1 tablet (75 mg total) by mouth every morning. What changed: when to take this   furosemide 40 MG tablet Commonly known as: LASIX Take 1 tablet (40 mg total) by mouth daily. Start taking on: October 14, 2020   glipiZIDE 5 MG 24 hr tablet Commonly known as: GLUCOTROL XL Take 5 mg by mouth daily with breakfast.   lamoTRIgine 100 MG tablet Commonly known as: LAMICTAL Take 100 mg by mouth daily.   levETIRAcetam 500 MG tablet Commonly known as: KEPPRA Take 500 mg by mouth 2 (two) times daily.   metFORMIN 1000 MG tablet Commonly known as: GLUCOPHAGE Take 1 tablet (1,000 mg total) by mouth 2 (two) times daily.   methotrexate 2.5 MG tablet   nitroGLYCERIN 0.4 MG SL tablet Commonly known as: NITROSTAT Place 0.4 mg under the tongue every 5 (five) minutes as needed for chest pain.   nystatin cream Commonly known as: MYCOSTATIN Apply 1 application topically 2 (two) times daily as needed (yeast infection).   potassium chloride 10 MEQ tablet Commonly known as: KLOR-CON Take 1 tablet (10 mEq total) by mouth daily. Start taking on: October 14, 2020   ranolazine 500 MG 12 hr tablet Commonly known as: Ranexa Take 1 tablet (500 mg total) by mouth 2 (two) times daily.   Simbrinza 1-0.2 % Susp Generic drug: Brinzolamide-Brimonidine Place 1 drop into both eyes 2 (two) times daily.   timolol 0.5 % ophthalmic solution Commonly known as: TIMOPTIC Place 1 drop into both eyes 2 (two) times daily.   vitamin B-12 1000  MCG tablet Commonly known as: CYANOCOBALAMIN Take 500 mcg by mouth every morning.   vitamin E 180 MG (400 UNITS) capsule Take 400 Units by mouth every morning.       Follow-up Information    Lamar Blinks, MD Follow up in 1 month(s).   Specialty: Cardiology Contact information: [address] [facility] [place] [address] [phone]        Marguarite Arbour, MD. Schedule an appointment as soon as possible for a visit in 1 week(s).   Specialty: Internal Medicine Contact information: [address] [facility] [place] [address] [phone]              Allergies  Allergen Reactions  . Adhesive [Tape] Other (See Comments)    Bruising and TEARING!!!  Please use "paper" tape  . Talwin [Pentazocine] Anaphylaxis and Swelling    Throat  swelling  . Valium [Diazepam] Other (See Comments)    Makes her feel like she's flying  . Vicodin [Hydrocodone-Acetaminophen] Nausea And Vomiting  . Beta Adrenergic Blockers Other (See Comments)    Pt states she not allergic  . Levofloxacin Swelling and Other (See Comments)    Tongue swells   . Simvastatin Swelling and Rash    Mouth swelling    Consultations:  Neurology   Procedures/Studies: DG Chest Port 1 View  Result Date: 10/09/2020 CLINICAL DATA:  80 year old female status post left-sided thoracentesis. EXAM: PORTABLE CHEST 1 VIEW COMPARISON:  Chest x-ray 10/07/2020. FINDINGS: Previously noted left-sided pleural effusion has substantially decreased in size, either completely resolved or with a trace amount of remaining fluid. There does appear to be a trace right pleural effusion. No definite pneumothorax. No acute consolidative airspace disease. No evidence of pulmonary edema. Heart size is enlarged. Upper mediastinal contours are within normal limits. Aortic atherosclerosis. Status post median sternotomy for CABG. IMPRESSION: 1. Successful drainage of left-sided pleural  effusion via thoracentesis with no pneumothorax or other acute complicating features. 2. Trace right pleural effusion. 3. Cardiomegaly. 4. Aortic atherosclerosis. Electronically Signed   By: Trudie Reed M.D.   On: 10/09/2020 11:36   DG Chest Portable 1 View  Result Date: 10/07/2020 CLINICAL DATA:  Short of breath EXAM: PORTABLE CHEST 1 VIEW COMPARISON:  04/26/2020, CT 05/04/2020 FINDINGS: Post sternotomy changes. Small left-sided pleural effusion with basilar airspace disease. Borderline to mild cardiomegaly. Aortic atherosclerosis. No pneumothorax. IMPRESSION: Small left pleural effusion with basilar airspace disease, atelectasis versus pneumonia. Electronically Signed   By: Jasmine Pang M.D.   On: 10/07/2020 17:45   ECHOCARDIOGRAM COMPLETE  Result Date: 10/08/2020    ECHOCARDIOGRAM REPORT   Patient Name:   Leah Small Date of Exam: 10/08/2020 Medical Rec #:  [id number]       Height:       63.0 in Accession #:    [id number]      Weight:       126.5 lb Date of Birth:  [birth year]       BSA:          1.592 m Patient Age:    79 years        BP:           156/94 mmHg Patient Gender: F               HR:           98 bpm. Exam Location:  [facility] Procedure: 2D Echo, Cardiac Doppler and Color Doppler Indications:     Dyspnea R06.00  History:         Patient has prior history of Echocardiogram examinations, most                  recent 04/29/2020. Previous Myocardial Infarction, TIA; Risk                  Factors:Diabetes.  Sonographer:     Cristela Blue RDCS (AE) Referring Phys:  [id number] AMY N COX Diagnosing Phys: Marcina Millard MD IMPRESSIONS  1. Left ventricular ejection fraction, by estimation, is 20 to 25%. The left ventricle has severely decreased function. The left ventricle demonstrates global hypokinesis. The left ventricular internal cavity size was moderately dilated. Left ventricular diastolic parameters were normal.  2. Right ventricular systolic function is normal. The right ventricular size is  normal.  3. Large pleural effusion in the left lateral region.  4. The mitral valve  is normal in structure. Moderate to severe mitral valve regurgitation. No evidence of mitral stenosis.  5. Tricuspid valve regurgitation is moderate to severe.  6. The aortic valve is normal in structure. Aortic valve regurgitation is not visualized. No aortic stenosis is present.  7. The inferior vena cava is normal in size with greater than 50% respiratory variability, suggesting right atrial pressure of 3 mmHg. FINDINGS  Left Ventricle: Left ventricular ejection fraction, by estimation, is 20 to 25%. The left ventricle has severely decreased function. The left ventricle demonstrates global hypokinesis. The left ventricular internal cavity size was moderately dilated. There is no left ventricular hypertrophy. Left ventricular diastolic parameters were normal. Right Ventricle: The right ventricular size is normal. No increase in right ventricular wall thickness. Right ventricular systolic function is normal. Left Atrium: Left atrial size was normal in size. Right Atrium: Right atrial size was normal in size. Pericardium: There is no evidence of pericardial effusion. Mitral Valve: The mitral valve is normal in structure. Moderate to severe mitral valve regurgitation. No evidence of mitral valve stenosis. MV peak gradient, 5.3 mmHg. The mean mitral valve gradient is 2.0 mmHg. Tricuspid Valve: The tricuspid valve is normal in structure. Tricuspid valve regurgitation is moderate to severe. No evidence of tricuspid stenosis. Aortic Valve: The aortic valve is normal in structure. Aortic valve regurgitation is not visualized. No aortic stenosis is present. Aortic valve mean gradient measures 2.0 mmHg. Aortic valve peak gradient measures 3.5 mmHg. Aortic valve area, by VTI measures 1.77 cm. Pulmonic Valve: The pulmonic valve was normal in structure. Pulmonic valve regurgitation is not visualized. No evidence of pulmonic stenosis. Aorta:  The aortic root is normal in size and structure. Venous: The inferior vena cava is normal in size with greater than 50% respiratory variability, suggesting right atrial pressure of 3 mmHg. IAS/Shunts: No atrial level shunt detected by color flow Doppler. Additional Comments: There is a large pleural effusion in the left lateral region.  LEFT VENTRICLE PLAX 2D LVIDd:         4.38 cm LVIDs:         4.00 cm LV PW:         1.48 cm LV IVS:        0.77 cm LVOT diam:     2.00 cm LV SV:         22 LV SV Index:   14 LVOT Area:     3.14 cm  LV Volumes (MOD) LV vol d, MOD A2C: 107.0 ml LV vol d, MOD A4C: 72.3 ml LV vol s, MOD A2C: 77.3 ml LV vol s, MOD A4C: 59.0 ml LV SV MOD A2C:     29.7 ml LV SV MOD A4C:     72.3 ml LV SV MOD BP:      22.9 ml RIGHT VENTRICLE RV Basal diam:  5.19 cm RV S prime:     6.96 cm/s TAPSE (M-mode): 3.6 cm LEFT ATRIUM             Index       RIGHT ATRIUM           Index LA diam:        2.90 cm 1.82 cm/m  RA Area:     18.70 cm LA Vol (A2C):   84.8 ml 53.28 ml/m RA Volume:   55.90 ml  35.12 ml/m LA Vol (A4C):   51.5 ml 32.36 ml/m LA Biplane Vol: 68.0 ml 42.73 ml/m  AORTIC VALVE  PULMONIC VALVE AV Area (Vmax):    1.50 cm    PV Vmax:        0.50 m/s AV Area (Vmean):   1.43 cm    PV Peak grad:   1.0 mmHg AV Area (VTI):     1.77 cm    RVOT Peak grad: 1 mmHg AV Vmax:           94.20 cm/s AV Vmean:          65.267 cm/s AV VTI:            0.122 m AV Peak Grad:      3.5 mmHg AV Mean Grad:      2.0 mmHg LVOT Vmax:         45.00 cm/s LVOT Vmean:        29.800 cm/s LVOT VTI:          0.069 m LVOT/AV VTI ratio: 0.56  AORTA Ao Root diam: 2.50 cm MITRAL VALVE               TRICUSPID VALVE MV Area (PHT): 5.62 cm    TR Peak grad:   46.5 mmHg MV Area VTI:   1.11 cm    TR Vmax:        341.00 cm/s MV Peak grad:  5.3 mmHg MV Mean grad:  2.0 mmHg    SHUNTS MV Vmax:       1.15 m/s    Systemic VTI:  0.07 m MV Vmean:      67.6 cm/s   Systemic Diam: 2.00 cm MV Decel Time: 135 msec MV E velocity:  94.30 cm/s MV A velocity: 58.70 cm/s MV E/A ratio:  1.61 Marcina Millard MD Electronically signed by Marcina Millard MD Signature Date/Time: 10/08/2020/1:18:22 PM    Final    US THORACENTESIS ASP PLEURAL SPACE W/IMG GUIDE  Result Date: 10/09/2020 INDICATION: 80 year old with left pleural effusion. EXAM: ULTRASOUND GUIDED LEFT THORACENTESIS MEDICATIONS: None. COMPLICATIONS: None immediate. PROCEDURE: An ultrasound guided thoracentesis was thoroughly discussed with the patient and questions answered. The benefits, risks, alternatives and complications were also discussed. The patient understands and wishes to proceed with the procedure. Written consent was obtained. Ultrasound was performed to localize and mark an adequate pocket of fluid in the left chest. The area was then prepped and draped in the normal sterile fashion. 1% Lidocaine was used for local anesthesia. Under ultrasound guidance a 6 Fr Safe-T-Centesis catheter was introduced. Thoracentesis was performed. The catheter was removed and a dressing applied. FINDINGS: A total of approximately 475 of yellow serous fluid was removed. Samples were sent to the laboratory as requested by the clinical team. Small right pleural effusion identified with ultrasound. IMPRESSION: Successful ultrasound guided left thoracentesis yielding 475 mL of pleural fluid. Electronically Signed   By: Richarda Overlie M.D.   On: 10/09/2020 11:16       Subjective: Patient's breathing is better, she has no cough, chest pain, swelling, abdominal pain,  Discharge Exam: Vitals:   10/13/20 0728 10/13/20 1037  BP: 124/68 140/65  Pulse: 87 84  Resp: 20   Temp: 97.8 F (36.6 C)   SpO2: 98%    Vitals:   10/13/20 0526 10/13/20 0615 10/13/20 0728 10/13/20 1037  BP: (!) 91/51 (!) 102/59 124/68 140/65  Pulse: 75 72 87 84  Resp:   20   Temp:   97.8 F (36.6 C)   TempSrc:   Oral   SpO2:   98%   Weight:  Height:        General: Pt is alert, awake, not in acute  distress Cardiovascular: RRR, nl S1-S2, no murmurs appreciated.   Her lower extremity edema is nearly resolved. Respiratory: Normal respiratory rate and rhythm.  CTAB without rales or wheezes. Abdominal: Abdomen soft and non-tender.  No distension or HSM.   Neuro/Psych: Strength symmetric in upper and lower extremities.  Judgment and insight appear normal.   The results of significant diagnostics from this hospitalization (including imaging, microbiology, ancillary and laboratory) are listed below for reference.     Microbiology: Recent Results (from the past 240 hour(s))  Resp Panel by RT-PCR (Flu A&B, Covid) Nasopharyngeal Swab     Status: None   Collection Time: 10/07/20  5:58 PM   Specimen: Nasopharyngeal Swab; Nasopharyngeal(NP) swabs in vial transport medium  Result Value Ref Range Status   SARS Coronavirus 2 by RT PCR NEGATIVE NEGATIVE Final    Comment: (NOTE) SARS-CoV-2 target nucleic acids are NOT DETECTED.  The SARS-CoV-2 RNA is generally detectable in upper respiratory specimens during the acute phase of infection. The lowest concentration of SARS-CoV-2 viral copies this assay can detect is 138 copies/mL. A negative result does not preclude SARS-Cov-2 infection and should not be used as the sole basis for treatment or other patient management decisions. A negative result may occur with  improper specimen collection/handling, submission of specimen other than nasopharyngeal swab, presence of viral mutation(s) within the areas targeted by this assay, and inadequate number of viral copies(<138 copies/mL). A negative result must be combined with clinical observations, patient history, and epidemiological information. The expected result is Negative.  Fact Sheet for Patients:  BloggerCourse.com  Fact Sheet for Healthcare Providers:  SeriousBroker.it  This test is no t yet approved or cleared by the [place] FDA and   has been authorized for detection and/or diagnosis of SARS-CoV-2 by FDA under an Emergency Use Authorization (EUA). This EUA will remain  in effect (meaning this test can be used) for the duration of the COVID-19 declaration under Section 564(b)(1) of the Act, 21 U.S.C.section 360bbb-3(b)(1), unless the authorization is terminated  or revoked sooner.       Influenza A by PCR NEGATIVE NEGATIVE Final   Influenza B by PCR NEGATIVE NEGATIVE Final    Comment: (NOTE) The Xpert Xpress SARS-CoV-2/FLU/RSV plus assay is intended as an aid in the diagnosis of influenza from Nasopharyngeal swab specimens and should not be used as a sole basis for treatment. Nasal washings and aspirates are unacceptable for Xpert Xpress SARS-CoV-2/FLU/RSV testing.  Fact Sheet for Patients: BloggerCourse.com  Fact Sheet for Healthcare Providers: SeriousBroker.it  This test is not yet approved or cleared by the [place] FDA and has been authorized for detection and/or diagnosis of SARS-CoV-2 by FDA under an Emergency Use Authorization (EUA). This EUA will remain in effect (meaning this test can be used) for the duration of the COVID-19 declaration under Section 564(b)(1) of the Act, 21 U.S.C. section 360bbb-3(b)(1), unless the authorization is terminated or revoked.  Performed at [facility], [address] [place], [place] [address]   Body fluid culture w Gram Stain     Status: None   Collection Time: 10/09/20 11:05 AM   Specimen: PATH Cytology Pleural fluid  Result Value Ref Range Status   Specimen Description   Final    PLEURAL Performed at [facility], [address], [place], [place] [address]    Special Requests   Final    NONE Performed at  [facility], [address], [place], [place] [address]    Gram Stain NO WBC SEEN NO ORGANISMS SEEN   Final   Culture   Final    NO GROWTH Performed at [facility], [address], [place], [place] [address]    Report Status 10/12/2020 FINAL  Final     Labs: BNP (last 3 results) Recent Labs    10/07/20 1756  BNP >4,500.0*   Basic Metabolic Panel: Recent Labs  Lab 10/09/20 0512 10/10/20 0516 10/11/20 0429 10/12/20 0501 10/13/20 0524  NA 134* 132* 131* 131* 131*  K 3.8 3.8 4.3 3.6 3.5  CL 101 98 95* 94* 94*  CO2 23 25 26 26 27   GLUCOSE 117* 143* 146* 130* 151*  BUN 32* 32* 33* 38* 36*  CREATININE 1.02* 1.23* 1.19* 1.16* 1.14*  CALCIUM 8.4* 8.0* 8.3* 8.4* 8.5*  MG 1.1* 1.2* 1.7  --   --    Liver Function Tests: Recent Labs  Lab 10/07/20 1756  AST 21  ALT 13  ALKPHOS 63  BILITOT 1.1  PROT 7.8  ALBUMIN 3.7   No results for input(s): LIPASE, AMYLASE in the last 168 hours. No results for input(s): AMMONIA in the last 168 hours. CBC: Recent Labs  Lab 10/09/20 0512 10/10/20 0516 10/11/20 0429 10/12/20 0501 10/13/20 0524  WBC 3.5* 3.9* 4.0 3.9* 3.3*  NEUTROABS 1.9 2.5 2.6 2.3 1.9  HGB 9.5* 9.6* 9.8* 10.1* 9.7*  HCT 29.2* 27.6* 29.7* 28.8* 27.8*  MCV 110.2* 107.0* 107.6* 105.9* 106.1*  PLT 112* 113* 114* 126* 119*   Cardiac Enzymes: No results for input(s): CKTOTAL, CKMB, CKMBINDEX, TROPONINI in the last 168 hours. BNP: Invalid input(s): POCBNP CBG: Recent Labs  Lab 10/12/20 0753 10/12/20 1129 10/12/20 1557 10/12/20 2007 10/13/20 0732  GLUCAP 147* 316* 289* 178* 156*   D-Dimer No results for input(s): DDIMER in the last 72 hours. Hgb A1c No results for input(s): HGBA1C in the last 72 hours. Lipid Profile No results for input(s): CHOL, HDL, LDLCALC, TRIG, CHOLHDL, LDLDIRECT in the last 72 hours. Thyroid function studies No results for input(s): TSH, T4TOTAL, T3FREE, THYROIDAB in the last 72 hours.  Invalid input(s): FREET3 Anemia work up No results for input(s): VITAMINB12, FOLATE, FERRITIN, TIBC, IRON, RETICCTPCT in the last 72 hours. Urinalysis    Component Value Date/Time   COLORURINE  COLORLESS (A) 12/28/2019 1818   APPEARANCEUR CLEAR (A) 12/28/2019 1818   LABSPEC 1.014 12/28/2019 1818   PHURINE 6.0 12/28/2019 1818   GLUCOSEU 50 (A) 12/28/2019 1818   HGBUR NEGATIVE 12/28/2019 1818   BILIRUBINUR NEGATIVE 12/28/2019 1818   KETONESUR NEGATIVE 12/28/2019 1818   PROTEINUR NEGATIVE 12/28/2019 1818   UROBILINOGEN 0.2 07/06/2015 1558   NITRITE NEGATIVE 12/28/2019 1818   LEUKOCYTESUR NEGATIVE 12/28/2019 1818   Sepsis Labs Invalid input(s): PROCALCITONIN,  WBC,  LACTICIDVEN Microbiology Recent Results (from the past 240 hour(s))  Resp Panel by RT-PCR (Flu A&B, Covid) Nasopharyngeal Swab     Status: None   Collection Time: 10/07/20  5:58 PM   Specimen: Nasopharyngeal Swab; Nasopharyngeal(NP) swabs in vial transport medium  Result Value Ref Range Status   SARS Coronavirus 2 by RT PCR NEGATIVE NEGATIVE Final    Comment: (NOTE) SARS-CoV-2 target nucleic acids are NOT DETECTED.  The SARS-CoV-2 RNA is generally detectable in upper respiratory specimens during the acute phase of infection. The lowest concentration of SARS-CoV-2 viral copies this assay can detect is 138 copies/mL. A negative result does not preclude SARS-Cov-2 infection  and should not be used as the sole basis for treatment or other patient management decisions. A negative result may occur with  improper specimen collection/handling, submission of specimen other than nasopharyngeal swab, presence of viral mutation(s) within the areas targeted by this assay, and inadequate number of viral copies(<138 copies/mL). A negative result must be combined with clinical observations, patient history, and epidemiological information. The expected result is Negative.  Fact Sheet for Patients:  BloggerCourse.com  Fact Sheet for Healthcare Providers:  SeriousBroker.it  This test is no t yet approved or cleared by the [place] FDA and  has been authorized for  detection and/or diagnosis of SARS-CoV-2 by FDA under an Emergency Use Authorization (EUA). This EUA will remain  in effect (meaning this test can be used) for the duration of the COVID-19 declaration under Section 564(b)(1) of the Act, 21 U.S.C.section 360bbb-3(b)(1), unless the authorization is terminated  or revoked sooner.       Influenza A by PCR NEGATIVE NEGATIVE Final   Influenza B by PCR NEGATIVE NEGATIVE Final    Comment: (NOTE) The Xpert Xpress SARS-CoV-2/FLU/RSV plus assay is intended as an aid in the diagnosis of influenza from Nasopharyngeal swab specimens and should not be used as a sole basis for treatment. Nasal washings and aspirates are unacceptable for Xpert Xpress SARS-CoV-2/FLU/RSV testing.  Fact Sheet for Patients: BloggerCourse.com  Fact Sheet for Healthcare Providers: SeriousBroker.it  This test is not yet approved or cleared by the [place] FDA and has been authorized for detection and/or diagnosis of SARS-CoV-2 by FDA under an Emergency Use Authorization (EUA). This EUA will remain in effect (meaning this test can be used) for the duration of the COVID-19 declaration under Section 564(b)(1) of the Act, 21 U.S.C. section 360bbb-3(b)(1), unless the authorization is terminated or revoked.  Performed at [facility], [address] [place], [place] [address]   Body fluid culture w Gram Stain     Status: None   Collection Time: 10/09/20 11:05 AM   Specimen: PATH Cytology Pleural fluid  Result Value Ref Range Status   Specimen Description   Final    PLEURAL Performed at [facility], [address], [place], [place] [address]    Special Requests   Final    NONE Performed at [facility], [address] [place], [place] [address]    Gram Stain NO WBC SEEN NO ORGANISMS SEEN   Final   Culture   Final    NO GROWTH Performed at [facility], [address] [place], [place] [address]    Report Status 10/12/2020 FINAL  Final     Time coordinating discharge: 25 minutes      SIGNED:   Alberteen Sam, MD  [facility] 10/13/2020, 11:30 AM

## 2020-10-13 NOTE — Plan of Care (Signed)

## 2020-10-14 ENCOUNTER — Telehealth: Payer: Self-pay

## 2020-10-14 NOTE — Telephone Encounter (Signed)
Phone call placed to patient to check in after recent hospitalization. Patient able to share that her breathing has improved since hospitalization. She continues to take diuretic and weigh daily. She was able to ambulate around her home today with rolling walker without becoming short of breath. Follow up visit with Palliative NP scheduled for 10/17/20 @ 3pm

## 2020-10-17 ENCOUNTER — Other Ambulatory Visit: Payer: Self-pay

## 2020-10-17 ENCOUNTER — Other Ambulatory Visit: Payer: Medicare Other | Admitting: Adult Health Nurse Practitioner

## 2020-10-17 DIAGNOSIS — E440 Moderate protein-calorie malnutrition: Secondary | ICD-10-CM

## 2020-10-17 DIAGNOSIS — Z515 Encounter for palliative care: Secondary | ICD-10-CM

## 2020-10-17 DIAGNOSIS — I209 Angina pectoris, unspecified: Secondary | ICD-10-CM

## 2020-10-17 DIAGNOSIS — I5023 Acute on chronic systolic (congestive) heart failure: Secondary | ICD-10-CM

## 2020-10-17 NOTE — Progress Notes (Unsigned)
Therapist, nutritional Palliative Care Consult Note Telephone: [phone]  Fax: [phone]  PATIENT NAME: Leah Small DOB: 03-02-1941 MRN: [id number]  PRIMARY CARE PROVIDER:   Marguarite Arbour, MD  REFERRING PROVIDER:  Marguarite Arbour, MD [address] [facility] [place],  [place] [address]  RESPONSIBLE PARTY:   Self [phone] [place], [place] [phone]  Chief complaint:  Follow up palliative visit/CHF  RECOMMENDATIONS and PLAN:  1.  Advanced care planning.Patient has MOST form in the home indicating she wants CPR only if witnessed arrest, limited hospital interventions, antibiotics and IV as indicated, and no feeding tube.  2.  CHF/angina.  Patient has had severe exacerbation of her CHF requiring hospitalization.  She is feeling much better but is having increased weakness.  Encouraged to conserve energy as much as possible and doing activity as tolerated.  Continue follow-up and recommendations by cardiology.  3.  PCM.  Patient has poor appetite.  Does not eat much when she does eat.  Does try to supplement with Ensure's or boost with ice cream.  Family helps prepare meals and encourage her to eat.  Continue encouragement and supplementation.  Palliative will continue to monitor for symptom management/decline and make recommendations as needed.  Follow-up visit in 4 weeks.  Encouraged to call with any questions or concerns  HISTORY OF PRESENT ILLNESS:  Leah Small is a 80 y.o. year old female with multiple medical problems including CAD, HTN, anemia,angina,seizures, DMT2, h/o MI and TIAs. Palliative Care was asked to help address goals of care.  Reviewed patient's chart including most recent labs and imaging.  Patient had hospitalization 2/14 through 10/13/2020 for CHF exacerbation.  Patient had to have thoracentesis with 5 L of fluid drawn off.  Patient states today that she has been weaker and more tired.  Patient does state  that she has increased shortness of breath with activity.  She is only walking short distances before needing to stop and rest.  States that she has been encouraged to take it slowly.  She does state that she has a little dizziness when getting up from seated position.  She does get edema in her feet when she has been up for longer periods of time and this will go down when she props her feet up.  Patient cannot lay flat and elevates her head on 2 pillows at night.  Appetite has been poor.  Patient's current weight is 114 pounds.  Though patient appears thinner her last weight in November 2021 was 109 pounds.  Patient is taking daily weights and has been given parameters in which she needs to reach out to her cardiologist with concerns for weight gain.  Rest of 10 point ROS asked and negative  CODE STATUS: see above  PPS: 50% HOSPICE ELIGIBILITY/DIAGNOSIS: TBD  PHYSICAL EXAM:  HR 100  O2 93% on RA General: NAD, frail appearing, thin Eyes: Sclera anicteric and noninjected with no discharge noted ENMT: moist mucous membranes Cardiovascular: regular rate and rhythm Pulmonary: lung sounds diminished but clear; normal respiratory effort Abdomen: soft, nontender, + bowel sounds Extremities: Trace edema to bilateral feet, no joint deformities Skin: no rasheson exposed skin Neurological: Weakness but otherwise nonfocal  PAST MEDICAL HISTORY:  Past Medical History:  Diagnosis Date  . Acid reflux   . Arthritis    "all over"  . CHF (congestive heart failure) (HCC)   . Chronic stable angina (HCC)   . Coronary artery disease   . Dyspnea   .  Hypercholesterolemia   . Hypertension   . Myocardial infarction (HCC) 1991; 07/06/2015  . NSTEMI (non-ST elevated myocardial infarction) (HCC) 10/22/2015  . Psoriatic arthritis (HCC)   . 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 TIA (transient ischemic attack)    "several over the last 20 years"  (10/22/2015)  . Type II diabetes mellitus (HCC)     SOCIAL HX:  Social History   Tobacco Use  . Smoking status: Never Smoker  . Smokeless tobacco: Never Used  Substance Use Topics  . Alcohol use: No    ALLERGIES:  Allergies  Allergen Reactions  . Adhesive [Tape] Other (See Comments)    Bruising and TEARING!!!  Please use "paper" tape  . Talwin [Pentazocine] Anaphylaxis and Swelling    Throat swelling  . Valium [Diazepam] Other (See Comments)    Makes her feel like she's flying  . Vicodin [Hydrocodone-Acetaminophen] Nausea And Vomiting  . Beta Adrenergic Blockers Other (See Comments)    Pt states she not allergic  . Levofloxacin Swelling and Other (See Comments)    Tongue swells   . Simvastatin Swelling and Rash    Mouth swelling     PERTINENT MEDICATIONS:  Outpatient Encounter Medications as of 10/17/2020  Medication Sig  . aspirin EC 81 MG tablet Take 81 mg by mouth at bedtime.  [name] atorvastatin (LIPITOR) 40 MG tablet Take 40 mg by mouth daily.  . clopidogrel (PLAVIX) 75 MG tablet Take 1 tablet (75 mg total) by mouth every morning. (Patient taking differently: Take 75 mg by mouth daily.)  . furosemide (LASIX) 40 MG tablet Take 1 tablet (40 mg total) by mouth daily.  [name] glipiZIDE (GLUCOTROL XL) 5 MG 24 hr tablet Take 5 mg by mouth daily with breakfast.  . lamoTRIgine (LAMICTAL) 100 MG tablet Take 100 mg by mouth daily.  [name] levETIRAcetam (KEPPRA) 500 MG tablet Take 500 mg by mouth 2 (two) times daily.  . metFORMIN (GLUCOPHAGE) 1000 MG tablet Take 1 tablet (1,000 mg total) by mouth 2 (two) times daily.  . methotrexate 2.5 MG tablet   . nitroGLYCERIN (NITROSTAT) 0.4 MG SL tablet Place 0.4 mg under the tongue every 5 (five) minutes as needed for chest pain.  [name] nystatin cream (MYCOSTATIN) Apply 1 application topically 2 (two) times daily as needed (yeast infection).   . potassium chloride SA (KLOR-CON) 10 MEQ tablet Take 1 tablet (10 mEq total) by mouth daily.  . ranolazine  (RANEXA) 500 MG 12 hr tablet Take 1 tablet (500 mg total) by mouth 2 (two) times daily.  [name] SIMBRINZA 1-0.2 % SUSP Place 1 drop into both eyes 2 (two) times daily.   . timolol (TIMOPTIC) 0.5 % ophthalmic solution Place 1 drop into both eyes 2 (two) times daily.  . vitamin B-12 (CYANOCOBALAMIN) 1000 MCG tablet Take 500 mcg by mouth every morning.   . vitamin E 400 UNIT capsule Take 400 Units by mouth every morning.    No facility-administered encounter medications on file as of 10/17/2020.    Amy Marlena Clipper, NP

## 2020-10-27 ENCOUNTER — Other Ambulatory Visit: Payer: Self-pay | Admitting: *Deleted

## 2020-10-27 DIAGNOSIS — D649 Anemia, unspecified: Secondary | ICD-10-CM

## 2020-10-27 DIAGNOSIS — D696 Thrombocytopenia, unspecified: Secondary | ICD-10-CM

## 2020-10-28 ENCOUNTER — Inpatient Hospital Stay: Payer: Medicare Other | Attending: Oncology

## 2020-10-28 DIAGNOSIS — D649 Anemia, unspecified: Secondary | ICD-10-CM | POA: Diagnosis present

## 2020-10-28 DIAGNOSIS — D696 Thrombocytopenia, unspecified: Secondary | ICD-10-CM | POA: Insufficient documentation

## 2020-10-28 LAB — CBC WITH DIFFERENTIAL/PLATELET
Abs Immature Granulocytes: 0.01 10*3/uL (ref 0.00–0.07)
Basophils Absolute: 0 10*3/uL (ref 0.0–0.1)
Basophils Relative: 1 %
Eosinophils Absolute: 0.1 10*3/uL (ref 0.0–0.5)
Eosinophils Relative: 2 %
HCT: 30.4 % — ABNORMAL LOW (ref 36.0–46.0)
Hemoglobin: 10 g/dL — ABNORMAL LOW (ref 12.0–15.0)
Immature Granulocytes: 0 %
Lymphocytes Relative: 22 %
Lymphs Abs: 0.8 10*3/uL (ref 0.7–4.0)
MCH: 35.3 pg — ABNORMAL HIGH (ref 26.0–34.0)
MCHC: 32.9 g/dL (ref 30.0–36.0)
MCV: 107.4 fL — ABNORMAL HIGH (ref 80.0–100.0)
Monocytes Absolute: 0.4 10*3/uL (ref 0.1–1.0)
Monocytes Relative: 11 %
Neutro Abs: 2.2 10*3/uL (ref 1.7–7.7)
Neutrophils Relative %: 64 %
Platelets: 178 10*3/uL (ref 150–400)
RBC: 2.83 MIL/uL — ABNORMAL LOW (ref 3.87–5.11)
RDW: 15 % (ref 11.5–15.5)
WBC: 3.4 10*3/uL — ABNORMAL LOW (ref 4.0–10.5)
nRBC: 0 % (ref 0.0–0.2)

## 2020-11-11 ENCOUNTER — Ambulatory Visit: Payer: Medicare Other | Admitting: Dermatology

## 2020-11-12 ENCOUNTER — Other Ambulatory Visit: Payer: Self-pay

## 2020-11-12 ENCOUNTER — Other Ambulatory Visit: Payer: Medicare Other | Admitting: Adult Health Nurse Practitioner

## 2020-11-12 DIAGNOSIS — I5023 Acute on chronic systolic (congestive) heart failure: Secondary | ICD-10-CM

## 2020-11-12 DIAGNOSIS — Z515 Encounter for palliative care: Secondary | ICD-10-CM

## 2020-11-12 DIAGNOSIS — I209 Angina pectoris, unspecified: Secondary | ICD-10-CM

## 2020-11-12 DIAGNOSIS — E440 Moderate protein-calorie malnutrition: Secondary | ICD-10-CM

## 2020-11-12 NOTE — Progress Notes (Signed)
Therapist, nutritional Palliative Care Consult Note Telephone: [phone]  Fax: [phone]  PATIENT NAME: Leah Small DOB: 1941/04/27 MRN: [id number]  PRIMARY CARE PROVIDER:   Marguarite Arbour, MD  REFERRING PROVIDER:  Marguarite Arbour, MD [address] [place],  [place] [address]  RESPONSIBLE PARTY:  Self [phone] [place], [place] [phone]  Chief complaint: Follow up palliative visit/malnutrition   RECOMMENDATIONS and PLAN:  1.  Advanced care planning.Patient has MOST form in the home indicating she wants CPR only if witnessed arrest, limited hospital interventions, antibiotics and IV as indicated, and no feeding tube.  2. Malnutrition.  Have continued to encouraged small more frequent meals with supplementation, such as Ensure. She monitors her weight weekly.  3.  Angina/CHF.  These are stable at this time.  Continue follow up and recommendations by cardiology  Palliative will continue to monitor for symptom management/decline and make recommendations as needed.  Patient does not want to set up follow up appointment and has agreed to have check in phone call in 2 months.  Encouraged to call with any questions or concerns   HISTORY OF PRESENT ILLNESS:  Leah Small is a 80 y.o. year old female with multiple medical problems including CAD, HTN, anemia,angina,seizures, DMT2, h/o MI and TIAs. Palliative Care was asked to help address goals of care. Have reviewed EMR. Patient states that she is having more SOB with activity but she is having less angina.  She is having to take her nitro less often.  Her appetite is still poor and her weight today is 108.  Family present during visit today and state that they do have to encourage her to eat.  She states that things just don't taste like they used to.  They are trying new recipes and have gotten her to eat a little bit more lately.  She is requiring more time and help  with ADLs and is not able to help in the kitchen like she used to.  She takes colace daily to help her not strain with BMs.  She does have some dizziness if her BP drops.  She has not had any falls, infections, or hospital visits since last visit.  Rest of 10 ROS asked and negative.  CODE STATUS: see above  PPS: 40% HOSPICE ELIGIBILITY/DIAGNOSIS: TBD  PHYSICAL EXAM: BP 122/78  HR 58 O2 96% on RA General: NAD, frail appearing, thin Eyes: Sclera anicteric and noninjected with no discharge noted ENMT: moist mucous membranes Cardiovascular: regular rate and rhythm Pulmonary:lung sounds diminished but clear; normal respiratory effort Abdomen: soft, nontender, + bowel sounds Extremities: Trace edema to bilateral feet, no joint deformities Skin: no rasheson exposed skin Neurological: Weakness but otherwise nonfocal  PAST MEDICAL HISTORY:  Past Medical History:  Diagnosis Date  . Acid reflux   . Arthritis    "all over"  . CHF (congestive heart failure) (HCC)   . Chronic stable angina (HCC)   . Coronary artery disease   . Dyspnea   . Hypercholesterolemia   . Hypertension   . Myocardial infarction (HCC) 1991; 07/06/2015  . NSTEMI (non-ST elevated myocardial infarction) (HCC) 10/22/2015  . Psoriatic arthritis (HCC)   . 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 TIA (transient ischemic attack)    "several over the last 20 years" (10/22/2015)  . Type II diabetes mellitus (HCC)     SOCIAL HX:  Social History  Tobacco Use  . Smoking status: Never Smoker  . Smokeless tobacco: Never Used  Substance Use Topics  . Alcohol use: No    ALLERGIES:  Allergies  Allergen Reactions  . Adhesive [Tape] Other (See Comments)    Bruising and TEARING!!!  Please use "paper" tape  . Talwin [Pentazocine] Anaphylaxis and Swelling    Throat swelling  . Valium [Diazepam] Other (See Comments)    Makes her feel like she's flying  .  Vicodin [Hydrocodone-Acetaminophen] Nausea And Vomiting  . Beta Adrenergic Blockers Other (See Comments)    Pt states she not allergic  . Levofloxacin Swelling and Other (See Comments)    Tongue swells   . Simvastatin Swelling and Rash    Mouth swelling     PERTINENT MEDICATIONS:  Outpatient Encounter Medications as of 11/12/2020  Medication Sig  . aspirin EC 81 MG tablet Take 81 mg by mouth at bedtime.  [name] atorvastatin (LIPITOR) 40 MG tablet Take 40 mg by mouth daily.  . clopidogrel (PLAVIX) 75 MG tablet Take 1 tablet (75 mg total) by mouth every morning. (Patient taking differently: Take 75 mg by mouth daily.)  . furosemide (LASIX) 40 MG tablet Take 1 tablet (40 mg total) by mouth daily.  [name] glipiZIDE (GLUCOTROL XL) 5 MG 24 hr tablet Take 5 mg by mouth daily with breakfast.  . lamoTRIgine (LAMICTAL) 100 MG tablet Take 100 mg by mouth daily.  [name] levETIRAcetam (KEPPRA) 500 MG tablet Take 500 mg by mouth 2 (two) times daily.  . metFORMIN (GLUCOPHAGE) 1000 MG tablet Take 1 tablet (1,000 mg total) by mouth 2 (two) times daily.  . methotrexate 2.5 MG tablet   . nitroGLYCERIN (NITROSTAT) 0.4 MG SL tablet Place 0.4 mg under the tongue every 5 (five) minutes as needed for chest pain.  [name] nystatin cream (MYCOSTATIN) Apply 1 application topically 2 (two) times daily as needed (yeast infection).   . potassium chloride SA (KLOR-CON) 10 MEQ tablet Take 1 tablet (10 mEq total) by mouth daily.  . ranolazine (RANEXA) 500 MG 12 hr tablet Take 1 tablet (500 mg total) by mouth 2 (two) times daily.  [name] SIMBRINZA 1-0.2 % SUSP Place 1 drop into both eyes 2 (two) times daily.   . timolol (TIMOPTIC) 0.5 % ophthalmic solution Place 1 drop into both eyes 2 (two) times daily.  . vitamin B-12 (CYANOCOBALAMIN) 1000 MCG tablet Take 500 mcg by mouth every morning.   . vitamin E 400 UNIT capsule Take 400 Units by mouth every morning.    No facility-administered encounter medications on file as of 11/12/2020.     Bryn Perkin Marlena Clipper, NP

## 2020-11-13 ENCOUNTER — Other Ambulatory Visit: Payer: Self-pay

## 2020-11-13 ENCOUNTER — Emergency Department
Admission: EM | Admit: 2020-11-13 | Discharge: 2020-11-13 | Disposition: A | Discharge status: Home / Self Care | Payer: Medicare Other | Attending: Emergency Medicine | Admitting: Emergency Medicine

## 2020-11-13 DIAGNOSIS — Z7982 Long term (current) use of aspirin: Secondary | ICD-10-CM | POA: Diagnosis not present

## 2020-11-13 DIAGNOSIS — Z7984 Long term (current) use of oral hypoglycemic drugs: Secondary | ICD-10-CM | POA: Diagnosis not present

## 2020-11-13 DIAGNOSIS — I110 Hypertensive heart disease with heart failure: Secondary | ICD-10-CM | POA: Insufficient documentation

## 2020-11-13 DIAGNOSIS — Z79899 Other long term (current) drug therapy: Secondary | ICD-10-CM | POA: Diagnosis not present

## 2020-11-13 DIAGNOSIS — Z955 Presence of coronary angioplasty implant and graft: Secondary | ICD-10-CM | POA: Diagnosis not present

## 2020-11-13 DIAGNOSIS — R040 Epistaxis: Secondary | ICD-10-CM | POA: Diagnosis present

## 2020-11-13 DIAGNOSIS — E119 Type 2 diabetes mellitus without complications: Secondary | ICD-10-CM | POA: Diagnosis not present

## 2020-11-13 DIAGNOSIS — I5023 Acute on chronic systolic (congestive) heart failure: Secondary | ICD-10-CM | POA: Diagnosis not present

## 2020-11-13 DIAGNOSIS — I2510 Atherosclerotic heart disease of native coronary artery without angina pectoris: Secondary | ICD-10-CM | POA: Diagnosis not present

## 2020-11-13 NOTE — ED Triage Notes (Signed)
Nose Bleed that started aprox 8am this morning , pt takes plavix and has a history of HTN

## 2020-11-13 NOTE — ED Provider Notes (Signed)
[facility] Emergency Department Provider Note   ____________________________________________   I have reviewed the triage vital signs and the nursing notes.   HISTORY  Chief Complaint Epistaxis (Nose Bleed started aprox 9am , Pt on plavix )   History limited by: Not Limited   HPI Leah Small is a 80 y.o. female who presents to the emergency department today because of concern for nose bleed. Patient is on plavix. States this morning felt a pressure in her left nares so blew her nose and that's what started the bleeding. It has been fairly constant since it started. The patient did try using afrin and had a brief respite from the bleeding before it started anew. The patient denies any shortness of breath or weakness. States she had a nose bleed once before roughly 5 years ago.     Records reviewed. Per medical record review patient has a history of MI.   Past Medical History:  Diagnosis Date  . Acid reflux   . Arthritis    "all over"  . CHF (congestive heart failure) (HCC)   . Chronic stable angina (HCC)   . Coronary artery disease   . Dyspnea   . Hypercholesterolemia   . Hypertension   . Myocardial infarction (HCC) 1991; 07/06/2015  . NSTEMI (non-ST elevated myocardial infarction) (HCC) 10/22/2015  . Psoriatic arthritis (HCC)   . 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 TIA (transient ischemic attack)    "several over the last 20 years" (10/22/2015)  . Type II diabetes mellitus [facility])     Patient Active Problem List   Diagnosis Date Noted  . Protein-calorie malnutrition, severe 10/09/2020  . Acute on chronic systolic CHF (congestive heart failure) (HCC) 10/09/2020  . Unstageable pressure ulcer of sacral region (HCC) 10/08/2020  . Heart failure (HCC) 10/07/2020  . Chest pain due to CAD (HCC) 04/26/2020  . Anemia 04/26/2020  . Severe thrombocytopenia (HCC) 04/26/2020  .  Pancytopenia (HCC)   . Iron deficiency anemia 01/25/2020  . History of ETT   . Acute respiratory failure with hypoxia (HCC)   . Palliative care by specialist   . Goals of care, counseling/discussion   . Pressure injury of skin 12/30/2019  . STEMI (ST elevation myocardial infarction) (HCC) 12/28/2019  . Malnutrition of moderate degree 08/12/2017  . Hypotension   . Carotid stenosis 11/12/2016  . Hypertensive heart disease 01/29/2016  . Normocytic anemia 01/29/2016  . History of vaginal bleeding 01/29/2016  . GERD (gastroesophageal reflux disease) 01/29/2016  . NSTEMI (non-ST elevated myocardial infarction) (HCC) 10/22/2015  . Non-STEMI (non-ST elevated myocardial infarction) (HCC) 10/22/2015  . 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 Coronary atherosclerosis of native coronary artery 10/22/2015  . Angina pectoris (HCC)   . Memory deficit   . Stroke (HCC) 07/06/2015  . Diabetes mellitus with complication (HCC) 07/06/2015  . Essential hypertension 07/06/2015  . Hyperlipidemia 07/06/2015  . Left-sided weakness     Past Surgical History:  Procedure Laterality Date  . ABDOMINAL HYSTERECTOMY  1973  . APPENDECTOMY  1950s   elementary school  . CARDIAC CATHETERIZATION  X 2-3  . CARDIAC CATHETERIZATION N/A 11/21/2015   Procedure: Left Heart Cath and Coronary Angiography;  Surgeon: Corky Crafts, MD;  Location: [facility] INVASIVE CV LAB;  Service: Cardiovascular;  Laterality: N/A;  . CARDIAC CATHETERIZATION  11/21/2015   Procedure: Coronary Stent Intervention;  Surgeon: Corky Crafts, MD;  Location: [facility]  INVASIVE CV LAB;  Service: Cardiovascular;;  . CARDIAC CATHETERIZATION N/A 01/29/2016   Procedure: Left Heart Cath and Cors/Grafts Angiography;  Surgeon: Tonny BollmanMichael Cooper, MD;  [facility] LAB;  Service: Cardiovascular;  Laterality: N/A;  . CARDIAC CATHETERIZATION N/A 07/22/2016   Procedure: Left Heart Cath and Coronary Angiography;  Surgeon: Iran OuchMuhammad A Arida, MD;   Location: [facility] INVASIVE CV LAB;  Service: Cardiovascular;  Laterality: N/A;  . CAROTID STENT INSERTION Bilateral 2011-2012   right-left  . CATARACT EXTRACTION W/ INTRAOCULAR LENS  IMPLANT, BILATERAL Bilateral 2009-2010  . CHOLECYSTECTOMY OPEN  ~ 2014  . CORONARY ANGIOPLASTY    . CORONARY ANGIOPLASTY WITH STENT PLACEMENT  11/21/2015  . CORONARY ARTERY BYPASS GRAFT  08/1995  . CORONARY/GRAFT ACUTE MI REVASCULARIZATION N/A 12/28/2019   Procedure: Coronary/Graft Acute MI Revascularization;  Surgeon: Alwyn Peaallwood, Dwayne D, MD;  Location: [facility] INVASIVE CV LAB;  Service: Cardiovascular;  Laterality: N/A;  . DILATION AND CURETTAGE OF UTERUS  X 1  . LEFT HEART CATH AND CORONARY ANGIOGRAPHY N/A 12/28/2019   Procedure: LEFT HEART CATH AND CORONARY ANGIOGRAPHY;  Surgeon: Alwyn Peaallwood, Dwayne D, MD;  Location: [facility] INVASIVE CV LAB;  Service: Cardiovascular;  Laterality: N/A;  . 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 TONSILLECTOMY  ~ 1947   2nd grade    Prior to Admission medications   Medication Sig Start Date End Date Taking? Authorizing Provider  aspirin EC 81 MG tablet Take 81 mg by mouth at bedtime.    [provider]  atorvastatin (LIPITOR) 40 MG tablet Take 40 mg by mouth daily. 12/04/19   [provider]  clopidogrel (PLAVIX) 75 MG tablet Take 1 tablet (75 mg total) by mouth every morning. Patient taking differently: Take 75 mg by mouth daily. 08/14/17   Enid BaasKalisetti, Radhika, MD  furosemide (LASIX) 40 MG tablet Take 1 tablet (40 mg total) by mouth daily. 10/14/20   Danford, Earl Liteshristopher P, MD  glipiZIDE (GLUCOTROL XL) 5 MG 24 hr tablet Take 5 mg by mouth daily with breakfast.    [provider]  lamoTRIgine (LAMICTAL) 100 MG tablet Take 100 mg by mouth daily. 11/13/19   [provider]  levETIRAcetam (KEPPRA) 500 MG tablet Take 500 mg by mouth 2 (two) times daily. 02/29/20   [provider]  metFORMIN (GLUCOPHAGE) 1000  MG tablet Take 1 tablet (1,000 mg total) by mouth 2 (two) times daily. 01/30/16   Creig HinesBerge, Christopher Ronald, NP  methotrexate 2.5 MG tablet  03/28/20   [provider]  nitroGLYCERIN (NITROSTAT) 0.4 MG SL tablet Place 0.4 mg under the tongue every 5 (five) minutes as needed for chest pain.    [provider]  nystatin cream (MYCOSTATIN) Apply 1 application topically 2 (two) times daily as needed (yeast infection).  04/25/15   [provider]  potassium chloride SA (KLOR-CON) 10 MEQ tablet Take 1 tablet (10 mEq total) by mouth daily. 10/14/20   Danford, Earl Liteshristopher P, MD  ranolazine (RANEXA) 500 MG 12 hr tablet Take 1 tablet (500 mg total) by mouth 2 (two) times daily. 08/14/17 04/26/20  Enid BaasKalisetti, Radhika, MD  SIMBRINZA 1-0.2 % SUSP Place 1 drop into both eyes 2 (two) times daily.  11/29/19   [provider]  timolol (TIMOPTIC) 0.5 % ophthalmic solution Place 1 drop into both eyes 2 (two) times daily. 06/14/17   [provider]  vitamin B-12 (CYANOCOBALAMIN) 1000 MCG tablet Take 500 mcg by mouth every morning.  [provider]  vitamin E 400 UNIT capsule Take 400 Units by mouth every morning.     [provider]    Allergies Adhesive [tape], Talwin [pentazocine], Valium [diazepam], Vicodin [hydrocodone-acetaminophen], Beta adrenergic blockers, Levofloxacin, and Simvastatin  Family History  Problem Relation Age of Onset  . Heart attack Father   . Diabetes Mother   . Cancer Mother   . Stroke Mother   . Breast cancer Cousin   . Breast cancer Cousin   . Breast cancer Cousin     Social History Social History   Tobacco Use  . Smoking status: Never Smoker  . Smokeless tobacco: Never Used  Substance Use Topics  . Alcohol use: No  . Drug use: No    Review of Systems Constitutional: No fever/chills Eyes: No visual changes. ENT: Positive for nose bleed.  Cardiovascular: Denies chest pain. Respiratory: Denies shortness of  breath. Gastrointestinal: No abdominal pain.  No nausea, no vomiting.  No diarrhea.   Genitourinary: Negative for dysuria. Musculoskeletal: Negative for back pain. Skin: Negative for rash. Neurological: Negative for headaches, focal weakness or numbness.  ____________________________________________   PHYSICAL EXAM:  VITAL SIGNS: ED Triage Vitals  Enc Vitals Group     BP 11/13/20 1515 (!) 154/91     Pulse Rate 11/13/20 1520 74     Resp 11/13/20 1515 18     Temp 11/13/20 1515 98 F (36.7 C)     Temp src --      SpO2 11/13/20 1520 94 %     Weight 11/13/20 1516 124 lb 1.9 oz (56.3 kg)     Height 11/13/20 1516 5\' 3"  (1.6 m)     Head Circumference --      Peak Flow --      Pain Score 11/13/20 1516 0   Constitutional: Alert and oriented.  Eyes: Conjunctivae are normal.  ENT      Head: Normocephalic and atraumatic.      Nose: Some blood in left nares, no active bleeding, no obvious source of bleeding.       Mouth/Throat: Mucous membranes are moist.      Neck: No stridor. Hematological/Lymphatic/Immunilogical: No cervical lymphadenopathy. Cardiovascular: Normal rate, regular rhythm.  No murmurs, rubs, or gallops.  Respiratory: Normal respiratory effort without tachypnea nor retractions. Breath sounds are clear and equal bilaterally. No wheezes/rales/rhonchi. Gastrointestinal: Soft and non tender. No rebound. No guarding.  Genitourinary: Deferred Musculoskeletal: Normal range of motion in all extremities. No lower extremity edema. Neurologic:  Normal speech and language. No gross focal neurologic deficits are appreciated.  Skin:  Skin is warm, dry and intact. No rash noted. Psychiatric: Mood and affect are normal. Speech and behavior are normal. Patient exhibits appropriate insight and judgment.  ____________________________________________    LABS (pertinent  positives/negatives)  None  ____________________________________________   EKG  None  ____________________________________________    RADIOLOGY  None  ____________________________________________   PROCEDURES  Procedures  ____________________________________________   INITIAL IMPRESSION / ASSESSMENT AND PLAN / ED COURSE  Pertinent labs & imaging results that were available during my care of the patient were reviewed by me and considered in my medical decision making (see chart for details).   Patient presented to the ed today because of concern for nose bleed. Did try afrin at home. After nose clamping here it was removed and the patient was observed for a couple more hours without any further bleeding. Will plan on discharging home.   ____________________________________________   FINAL CLINICAL IMPRESSION(S) / ED DIAGNOSES  Final diagnoses:  Epistaxis     Note: This dictation was prepared with Dragon dictation. Any transcriptional errors that result from this process are unintentional     Phineas Semen, MD 11/13/20 1731

## 2020-11-13 NOTE — ED Notes (Signed)
Pt calm , collective, denied pain or sob. Discharge instructions reviewed with pt and daughter

## 2020-11-13 NOTE — Discharge Instructions (Addendum)
Please seek medical attention for any high fevers, chest pain, shortness of breath, change in behavior, persistent vomiting, bloody stool or any other new or concerning symptoms.  

## 2020-11-14 ENCOUNTER — Telehealth: Payer: Self-pay | Admitting: Oncology

## 2020-11-14 NOTE — Telephone Encounter (Signed)
They want to check her labs sooner than the 4/4 appt please call back to reschedule if need be

## 2020-11-15 ENCOUNTER — Telehealth: Payer: Self-pay | Admitting: Oncology

## 2020-11-22 NOTE — Telephone Encounter (Signed)
Victorino Dike called the pt's house and spoke to son. She passed away 1 hour before she called to make an appt.

## 2020-11-22 NOTE — Telephone Encounter (Signed)
Returned to call to patient after leaving a message yesterday to r/s appt on 4/4 to a sooner date. Patient's son Dorinda Hill) answered and stated that patient passed away this morning 11-25-2020. Expressed our condolences.

## 2020-11-22 DEATH — deceased

## 2020-11-25 ENCOUNTER — Ambulatory Visit: Payer: Medicare Other | Admitting: Oncology

## 2020-11-25 ENCOUNTER — Other Ambulatory Visit: Payer: Medicare Other

## 2020-11-26 ENCOUNTER — Ambulatory Visit: Payer: Medicare Other | Admitting: Oncology

## 2020-11-26 ENCOUNTER — Other Ambulatory Visit: Payer: Medicare Other

## 2020-11-27 ENCOUNTER — Ambulatory Visit: Payer: Medicare Other | Admitting: Dermatology

## 2022-06-10 NOTE — Telephone Encounter (Signed)
Signing encounter, See previous note 12/31/21
